# Patient Record
Sex: Female | Born: 1945 | Race: White | Hispanic: No | Marital: Married | Smoking: Former smoker
Health system: Southern US, Community
[De-identification: ages and names within clinical notes are randomized; demographics above are authoritative.]

## PROBLEM LIST (undated history)

## (undated) DIAGNOSIS — I700 Atherosclerosis of aorta: Secondary | ICD-10-CM

## (undated) DIAGNOSIS — E119 Type 2 diabetes mellitus without complications: Secondary | ICD-10-CM

## (undated) DIAGNOSIS — E1140 Type 2 diabetes mellitus with diabetic neuropathy, unspecified: Secondary | ICD-10-CM

## (undated) DIAGNOSIS — M1990 Unspecified osteoarthritis, unspecified site: Secondary | ICD-10-CM

## (undated) DIAGNOSIS — G8929 Other chronic pain: Secondary | ICD-10-CM

## (undated) DIAGNOSIS — S0291XA Unspecified fracture of skull, initial encounter for closed fracture: Secondary | ICD-10-CM

## (undated) DIAGNOSIS — E785 Hyperlipidemia, unspecified: Secondary | ICD-10-CM

## (undated) DIAGNOSIS — I824Y9 Acute embolism and thrombosis of unspecified deep veins of unspecified proximal lower extremity: Secondary | ICD-10-CM

## (undated) DIAGNOSIS — M542 Cervicalgia: Secondary | ICD-10-CM

## (undated) DIAGNOSIS — I8390 Asymptomatic varicose veins of unspecified lower extremity: Secondary | ICD-10-CM

## (undated) DIAGNOSIS — G510 Bell's palsy: Secondary | ICD-10-CM

## (undated) DIAGNOSIS — G459 Transient cerebral ischemic attack, unspecified: Secondary | ICD-10-CM

## (undated) DIAGNOSIS — I773 Arterial fibromuscular dysplasia: Secondary | ICD-10-CM

## (undated) DIAGNOSIS — I340 Nonrheumatic mitral (valve) insufficiency: Secondary | ICD-10-CM

## (undated) HISTORY — DX: Atherosclerosis of aorta: I70.0

## (undated) HISTORY — PX: OTHER SURGICAL HISTORY: SHX169

## (undated) HISTORY — DX: Nonrheumatic mitral (valve) insufficiency: I34.0

## (undated) HISTORY — DX: Transient cerebral ischemic attack, unspecified: G45.9

## (undated) HISTORY — PX: ENDOVENOUS ABLATION SAPHENOUS VEIN W/ LASER: SUR449

## (undated) HISTORY — DX: Acute embolism and thrombosis of unspecified deep veins of unspecified proximal lower extremity: I82.4Y9

## (undated) HISTORY — PX: TONSILLECTOMY: SHX5217

## (undated) HISTORY — DX: Type 2 diabetes mellitus with diabetic neuropathy, unspecified: E11.40

## (undated) HISTORY — DX: Arterial fibromuscular dysplasia: I77.3

## (undated) HISTORY — DX: Unspecified osteoarthritis, unspecified site: M19.90

## (undated) HISTORY — DX: Other chronic pain: G89.29

## (undated) HISTORY — DX: Asymptomatic varicose veins of unspecified lower extremity: I83.90

## (undated) HISTORY — PX: ELBOW SURGERY: SHX618

---
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
## 1998-06-14 ENCOUNTER — Ambulatory Visit (HOSPITAL_COMMUNITY): Admission: RE | Admit: 1998-06-14 | Discharge: 1998-06-14 | Payer: Self-pay | Admitting: *Deleted

## 1998-06-21 ENCOUNTER — Ambulatory Visit (HOSPITAL_COMMUNITY): Admission: RE | Admit: 1998-06-21 | Discharge: 1998-06-21 | Payer: Self-pay | Admitting: *Deleted

## 1998-10-15 ENCOUNTER — Ambulatory Visit (HOSPITAL_COMMUNITY): Admission: RE | Admit: 1998-10-15 | Discharge: 1998-10-15 | Payer: Self-pay | Admitting: *Deleted

## 1999-03-20 ENCOUNTER — Encounter: Payer: Self-pay | Admitting: Orthopedic Surgery

## 1999-03-20 ENCOUNTER — Ambulatory Visit (HOSPITAL_COMMUNITY): Admission: RE | Admit: 1999-03-20 | Discharge: 1999-03-20 | Payer: Self-pay | Admitting: Orthopedic Surgery

## 1999-09-15 ENCOUNTER — Other Ambulatory Visit: Admission: RE | Admit: 1999-09-15 | Discharge: 1999-09-15 | Payer: Self-pay | Admitting: *Deleted

## 1999-09-18 ENCOUNTER — Encounter: Payer: Self-pay | Admitting: *Deleted

## 1999-09-18 ENCOUNTER — Ambulatory Visit (HOSPITAL_COMMUNITY): Admission: RE | Admit: 1999-09-18 | Discharge: 1999-09-18 | Payer: Self-pay | Admitting: *Deleted

## 2000-03-19 ENCOUNTER — Ambulatory Visit (HOSPITAL_BASED_OUTPATIENT_CLINIC_OR_DEPARTMENT_OTHER): Admission: RE | Admit: 2000-03-19 | Discharge: 2000-03-19 | Payer: Self-pay | Admitting: Orthopedic Surgery

## 2000-09-17 ENCOUNTER — Other Ambulatory Visit: Admission: RE | Admit: 2000-09-17 | Discharge: 2000-09-17 | Payer: Self-pay | Admitting: *Deleted

## 2000-10-16 ENCOUNTER — Ambulatory Visit (HOSPITAL_BASED_OUTPATIENT_CLINIC_OR_DEPARTMENT_OTHER): Admission: RE | Admit: 2000-10-16 | Discharge: 2000-10-16 | Payer: Self-pay | Admitting: Orthopedic Surgery

## 2001-03-28 ENCOUNTER — Encounter (INDEPENDENT_AMBULATORY_CARE_PROVIDER_SITE_OTHER): Payer: Self-pay

## 2001-03-28 ENCOUNTER — Ambulatory Visit (HOSPITAL_COMMUNITY): Admission: RE | Admit: 2001-03-28 | Discharge: 2001-03-28 | Payer: Self-pay | Admitting: Obstetrics and Gynecology

## 2001-10-02 ENCOUNTER — Other Ambulatory Visit: Admission: RE | Admit: 2001-10-02 | Discharge: 2001-10-02 | Payer: Self-pay | Admitting: Obstetrics and Gynecology

## 2004-03-02 ENCOUNTER — Ambulatory Visit (HOSPITAL_COMMUNITY): Admission: RE | Admit: 2004-03-02 | Discharge: 2004-03-02 | Payer: Self-pay | Admitting: Internal Medicine

## 2004-07-03 ENCOUNTER — Ambulatory Visit (HOSPITAL_COMMUNITY): Admission: RE | Admit: 2004-07-03 | Discharge: 2004-07-03 | Payer: Self-pay | Admitting: *Deleted

## 2004-07-20 ENCOUNTER — Encounter: Admission: RE | Admit: 2004-07-20 | Discharge: 2004-07-20 | Payer: Self-pay | Admitting: Internal Medicine

## 2004-09-19 ENCOUNTER — Encounter: Admission: RE | Admit: 2004-09-19 | Discharge: 2004-09-19 | Payer: Self-pay | Admitting: Internal Medicine

## 2004-10-24 ENCOUNTER — Encounter: Admission: RE | Admit: 2004-10-24 | Discharge: 2004-10-24 | Payer: Self-pay | Admitting: Internal Medicine

## 2004-10-26 ENCOUNTER — Encounter: Admission: RE | Admit: 2004-10-26 | Discharge: 2004-10-26 | Payer: Self-pay | Admitting: Internal Medicine

## 2004-10-31 ENCOUNTER — Encounter: Admission: RE | Admit: 2004-10-31 | Discharge: 2004-10-31 | Payer: Self-pay | Admitting: Internal Medicine

## 2004-11-28 ENCOUNTER — Encounter: Admission: RE | Admit: 2004-11-28 | Discharge: 2004-11-28 | Payer: Self-pay | Admitting: Internal Medicine

## 2004-12-05 ENCOUNTER — Encounter: Admission: RE | Admit: 2004-12-05 | Discharge: 2004-12-05 | Payer: Self-pay | Admitting: Internal Medicine

## 2004-12-12 ENCOUNTER — Encounter: Admission: RE | Admit: 2004-12-12 | Discharge: 2004-12-12 | Payer: Self-pay | Admitting: Internal Medicine

## 2004-12-26 ENCOUNTER — Encounter: Admission: RE | Admit: 2004-12-26 | Discharge: 2004-12-26 | Payer: Self-pay | Admitting: Internal Medicine

## 2005-01-11 ENCOUNTER — Encounter: Admission: RE | Admit: 2005-01-11 | Discharge: 2005-01-11 | Payer: Self-pay | Admitting: Interventional Radiology

## 2005-02-06 ENCOUNTER — Encounter: Admission: RE | Admit: 2005-02-06 | Discharge: 2005-02-06 | Payer: Self-pay | Admitting: Internal Medicine

## 2005-04-24 ENCOUNTER — Encounter: Admission: RE | Admit: 2005-04-24 | Discharge: 2005-04-24 | Payer: Self-pay | Admitting: Internal Medicine

## 2005-06-12 ENCOUNTER — Encounter: Admission: RE | Admit: 2005-06-12 | Discharge: 2005-06-12 | Payer: Self-pay | Admitting: Internal Medicine

## 2005-12-25 ENCOUNTER — Emergency Department (HOSPITAL_COMMUNITY): Admission: EM | Admit: 2005-12-25 | Discharge: 2005-12-25 | Payer: Self-pay | Admitting: Family Medicine

## 2006-05-21 ENCOUNTER — Encounter: Admission: RE | Admit: 2006-05-21 | Discharge: 2006-05-21 | Payer: Self-pay | Admitting: Internal Medicine

## 2006-06-12 ENCOUNTER — Encounter: Admission: RE | Admit: 2006-06-12 | Discharge: 2006-06-12 | Payer: Self-pay | Admitting: Internal Medicine

## 2007-05-12 ENCOUNTER — Encounter: Admission: RE | Admit: 2007-05-12 | Discharge: 2007-08-10 | Payer: Self-pay | Admitting: Internal Medicine

## 2007-07-10 ENCOUNTER — Encounter: Admission: RE | Admit: 2007-07-10 | Discharge: 2007-07-10 | Payer: Self-pay | Admitting: Internal Medicine

## 2008-07-13 ENCOUNTER — Encounter: Admission: RE | Admit: 2008-07-13 | Discharge: 2008-07-13 | Payer: Self-pay | Admitting: Internal Medicine

## 2009-07-26 ENCOUNTER — Encounter: Admission: RE | Admit: 2009-07-26 | Discharge: 2009-07-26 | Payer: Self-pay | Admitting: Internal Medicine

## 2010-02-01 ENCOUNTER — Other Ambulatory Visit: Admission: RE | Admit: 2010-02-01 | Discharge: 2010-02-01 | Payer: Self-pay | Admitting: Internal Medicine

## 2010-09-24 ENCOUNTER — Encounter: Payer: Self-pay | Admitting: Internal Medicine

## 2011-01-19 NOTE — H&P (Signed)
[facility]  Patient:    Beth Macdonald, Beth Macdonald                      MRN: [id number] Adm. Date:  03/28/01 Attending:  Lenoard Aden, M.D.                         History and Physical  ADMISSION DIAGNOSIS:          Postmenopausal bleeding.  HISTORY OF PRESENT ILLNESS:   The patient is a 65 year old white female G0 who presents with postmenopausal bleeding.  Saline sonohysterography consistent with posterior wall and anterior wall thickening, consistent with a questionable polyp versus submucosal fibroid.  She has had a normal endometrial biopsy performed in October 1999 and she is postmenopausal, currently on no hormone replacement therapy.  MEDICATIONS:                  1. Lipitor.                               2. Vitamins.                               3. Calcium.                               4. Vioxx.  PAST SURGICAL HISTORY:        She has had a history of left elbow surgery in 2001 and right elbow surgery as well.  PAST MEDICAL HISTORY:         She has history of HPV with laser vaporization and Efudex.  She has a history of hypercholesterolemia.  FAMILY HISTORY:               She has a family history of leukemia.  SOCIAL HISTORY:               Otherwise noncontributory.  She is a nonsmoker, nondrinker.  Denies domestic or physical violence.  PHYSICAL EXAMINATION:  GENERAL:                      She is a well-developed, well-nourished white female in no apparent distress.  HEENT:                        Normal.  LUNGS:                        Clear.  HEART:                        Regular rate and rhythm.  ABDOMEN:                      Soft, nontender.  PELVIC:                       An anteflexed uterus and no adnexal masses.  IMPRESSION:                   1. Postmenopausal bleeding on no hormone  replacement therapy.                               2. History of atypical squamous cells of                  undetermined significance.                               3. History of postmenopausal bleeding with                                  normal endometrial biopsy in 1999.  PLAN:                         Proceed with diagnostic hysteroscopy, possible resectoscope.  Risks of anesthesia, infection, bleeding, injury of abdominal organs, and need for repair is discussed.  The patient acknowledges and desires to proceed.  Delayed versus immediate complications to include bowel and bladder injury are noted.  The patient acknowledges. DD:  03/28/01 TD:  03/28/01 Job: [id number]

## 2011-01-19 NOTE — Op Note (Signed)
NAMEJANASHA, Beth Macdonald NO.:  [id number]   MEDICAL RECORD NO.:  [id number]          PATIENT TYPE:  AMB   LOCATION:  ENDO                         FACILITY:  [facility]   PHYSICIAN:  Georgiana Spinner, M.D.    DATE OF BIRTH:  [birth year]   DATE OF PROCEDURE:  07/03/2004  DATE OF DISCHARGE:                                 OPERATIVE REPORT   PROCEDURE:  Colonoscopy.   INDICATIONS:  Rectal bleeding, colon cancer screening.   ANESTHESIA:  Demerol 60, Versed 7 mg.   DESCRIPTION OF PROCEDURE:  With patient mildly sedated in the left lateral  decubitus position, the Olympus videoscopic colonoscope was inserted in the  rectum and passed under direct vision to the cecum, identified by ileocecal  valve and base of cecum, both of which were photographed.  From this point,  the colonoscope was slowly withdrawn, taking circumferential views of the  colonic mucosa, stopping in the rectum which appeared normal on direct and  retroflexed view.  The endoscope was straightened and withdrawn.  The  patient's vital signs and pulse oximeter remained stable.  The patient  tolerated the procedure well without apparent complications.   FINDINGS:  Unremarkable examination.   PLAN:  Consider repeat examination in 5-10 years.      GMO/MEDQ  D:  07/03/2004  T:  07/03/2004  Job:  [id number]

## 2011-01-19 NOTE — Op Note (Signed)
[place]  Patient:    Beth Macdonald, Beth Macdonald                    MRN: [id number] Proc. Date: 10/16/00 Adm. Date:  [id number] Attending:  Ronne Binning                           Operative Report  PREOPERATIVE DIAGNOSIS:  Lateral epicondylitis, right elbow.  POSTOPERATIVE DIAGNOSIS:  Lateral epicondylitis, right elbow.  OPERATION PERFORMED:  Reattachment of extensor origin, right elbow.  SURGEON:  Nicki Reaper, M.D.  ASSISTANTCarolyne Fiscal, RN.  ANESTHESIA:  Superclavicular.  ANESTHESIOLOGIST:  Dr. Gelene Mink.  INDICATIONS FOR PROCEDURE:  The patient is a 65 year old female with a history of lateral epicondylitis.  She has undergone repair on her left side.  She has failed conservative treatment on her right side.  She is admitted now for a reconstruction of her lateral condyle extensor origin, right elbow.  DESCRIPTION OF PROCEDURE:  The patient was brought to the operating room where a superclavicular block was carried out without difficulty.  She was prepped and draped using Betadine scrub and solution.  With the right arm free, the limb was exsanguinated with an Esmarch bandage.  The tourniquet was placed high on the arm and inflated to 250 mmHg.  A straight incision was made over the lateral epicondyle and carried down through subcutaneous tissues. Bleeders were electrocauterized.  The extensor anconeus interval was opened. The dissection carried down to the joint.  The frond of tissue in the synovium posterior to the radiohumeral joint was excise.  This area was irrigated and closed with Vicryl sutures.  A longitudinal incision was then made at the extensor origin.  A spur was encountered.  This was removed.  The origin of the extensor brevis was found to be replaced with granulation tissue.  A very friable nonadherent portion from the lateral epicondyle of this area was debrided.  The bone was roughened.  Two 3.5 Statek anchors were then  inserted. The area irrigated.  The extensor origin was then repaired down to the epicondyle and secured into position burying the knots underneath the extensor origin.  The remainder of the incision into the extensor origin was closed with figure-of-eight 4-0 Vicryl sutures.  The subcutaneous tissues were closed with interrupted 4-0 Vicryl and the skin with subcuticular 4-0 Monocryl sutuers.  Steri-Strips were applied and a sterile compressive dressing and a long arm splint were applied.  The patient tolerated the procedure well and was taken to the recovery room for observation in satisfactory condition. She is discharged home to return to the [facility] [place] in one week on Percodan, Keflex and Ambien. DD:  10/16/00 TD:  10/16/00 Job: [id number]

## 2011-01-19 NOTE — Op Note (Signed)
[facility]  Patient:    Beth Macdonald, Beth Macdonald                    MRN: [id number] Proc. Date: 03/28/01 Adm. Date:  [id number] Attending:  Lenoard Aden CC:         [facility]   Operative Report  PREOPERATIVE DIAGNOSIS:       Postmenopausal bleeding.  POSTOPERATIVE DIAGNOSIS:      Endometrial polyps x 2.  PROCEDURES:                   1. Diagnostic hysteroscopy.                               2. Polypectomy.                               3. Dilation and curettage.  SURGEON:                      Lenoard Aden, M.D.  ANESTHESIA:                   General.  ESTIMATED BLOOD LOSS:         50 cc.  COMPLICATIONS:                None.  FLUID DEFICIT:                50 cc.  COUNTS:                       Correct.  DISPOSITION:                  Patient to recovery in good condition.  BRIEF OPERATIVE NOTE:         After being apprised of the risks of anesthesia, infection, bleeding, and uterine perforation with the need for repair, the patient was brought to the operating room, where she was administered general anesthetic without complication, prepped and draped in the usual sterile fashion and catheterized until the bladder was empty.  Examination under anesthesia revealed a small, anteflexed uterus and no adnexal masses.  A dilute Pitressin solution was placed at 3 and 9 oclock, 12 cc total.  The cervix was dilated with difficulty up to a #27 Pratt dilator.  A diagnostic hysteroscope was placed, revealing one small left lateral wall to anterior fundal uterine endometrial polyp which appeared fleshy and vascular, and a smaller, more atrophic-appearing posterior wall near the cervicouterine junction which was along the posterior wall, which appeared more atrophic. These were resected using resectoscope was placed difficulty.  D&C was performed.  Revisualization revealed complete removal of the polyps. Specimens were sent to pathology for  confirmation.  Bilateral normal tubal ostia were noted.  Fluid deficit of 50 cc was noted.  All instruments were removed.  Good hemostasis was noted.  The patient tolerated the procedure well and was transferred to recovery in good condition. DD:  03/28/01 TD:  03/29/01 Job: [id number]

## 2011-01-19 NOTE — Op Note (Signed)
[place]. [facility]  Patient:    Beth Macdonald, Beth Macdonald                    MRN: [id number] Proc. Date: 03/19/00 Adm. Date:  [id number] Attending:  Ronne Binning CC:         Nicki Reaper, M.D. (2 copies)                           Operative Report  PREOPERATIVE DIAGNOSIS:  Lateral epicondylitis, left elbow.  POSTOPERATIVE DIAGNOSIS:  Lateral epicondylitis, left elbow.  OPERATION PERFORMED:  Reattachment extensor origin, left elbow.  SURGEON:  Nicki Reaper, M.D.  ASSISTANT:  R.N.  ANESTHESIA:  Axillary block  ANESTHESIA:  Burna Forts, M.D.  HISTORY OF PRESENT ILLNESS:  The patient is a 65 year old female with a history of lateral epicondylitis which has not responded to conservative treatment.  DESCRIPTION OF PROCEDURE:  The patient was brought to the operating room where an axillary block was carried out without difficulty. She was prepped and draped using Betadine scrubbing solution. The left arm freed, the wound was exsanguinated with an Esmarch bandage, tourniquet placed high and the arm was inflated to 250 mmHg. A straight incision was made over the epicondyle, carried down through subcutaneous tissue, bleeders were electrocauterized. The anterior bulb between the anconeus and the extensor muscle mass was opened down to the radial humeral joint. Debridement of the articular ______ synovial tissue was then performed. This was irrigated and closed with interrupted 4-0 Vicryl. A longitudinal incision was then made into the extensor origin in line with the fibers. These were then split. The extensor brevis was immediately apparent with an area of degenerative tissue. This area was debrided with a complete rupture of the extensor brevis off from the lateral epicondyle. The epicondyle was roughened. A 3/5 Statak anchor was then placed, this did not hold out. A second anchor was then reinserted slightly more distally and a third slightly more  proximally. These showed good fixation and did not pull out. The extensor origin was then reapproximated under the roughened area of the epicondyle and sutured into position with the 2-0 Mersilene sutures burying the knot in the hole from the original anchor. The fascia was then closed with figure-of-eight 4-0 Vicryl. This was all done after irrigation of the extensor origin area. The subcutaneous tissue was closed with interrupted 4-0 Vicryl and the skin with subcuticular 3-0 monocryl sutures. Steri-Strips were applied. A sterile compressive dressing and long arm splint applied. The patient tolerated the procedure well and was taken to the recovery room for observation in satisfactory condition. She is discharged home to return to the [facility] [place] in 1 week on Vicodin and Keflex. DD:  03/03/00 TD:  03/20/00 Job: [id number]

## 2011-04-05 ENCOUNTER — Other Ambulatory Visit: Payer: Self-pay | Admitting: Orthopedic Surgery

## 2011-04-05 DIAGNOSIS — M25512 Pain in left shoulder: Secondary | ICD-10-CM

## 2011-04-09 ENCOUNTER — Ambulatory Visit
Admission: RE | Admit: 2011-04-09 | Discharge: 2011-04-09 | Disposition: A | Discharge status: Home / Self Care | Payer: Medicare Other | Source: Ambulatory Visit | Attending: Orthopedic Surgery | Admitting: Orthopedic Surgery

## 2011-04-09 ENCOUNTER — Other Ambulatory Visit: Payer: Self-pay

## 2011-04-09 DIAGNOSIS — M25512 Pain in left shoulder: Secondary | ICD-10-CM

## 2011-10-18 DIAGNOSIS — H40019 Open angle with borderline findings, low risk, unspecified eye: Secondary | ICD-10-CM | POA: Diagnosis not present

## 2011-10-18 DIAGNOSIS — H16149 Punctate keratitis, unspecified eye: Secondary | ICD-10-CM | POA: Diagnosis not present

## 2011-12-13 DIAGNOSIS — Z79899 Other long term (current) drug therapy: Secondary | ICD-10-CM | POA: Diagnosis not present

## 2011-12-13 DIAGNOSIS — E7800 Pure hypercholesterolemia, unspecified: Secondary | ICD-10-CM | POA: Diagnosis not present

## 2011-12-20 DIAGNOSIS — R7309 Other abnormal glucose: Secondary | ICD-10-CM | POA: Diagnosis not present

## 2011-12-20 DIAGNOSIS — E7800 Pure hypercholesterolemia, unspecified: Secondary | ICD-10-CM | POA: Diagnosis not present

## 2011-12-20 DIAGNOSIS — M159 Polyosteoarthritis, unspecified: Secondary | ICD-10-CM | POA: Diagnosis not present

## 2011-12-20 DIAGNOSIS — K5900 Constipation, unspecified: Secondary | ICD-10-CM | POA: Diagnosis not present

## 2012-01-02 DIAGNOSIS — Z1212 Encounter for screening for malignant neoplasm of rectum: Secondary | ICD-10-CM | POA: Diagnosis not present

## 2012-02-20 DIAGNOSIS — L538 Other specified erythematous conditions: Secondary | ICD-10-CM | POA: Diagnosis not present

## 2012-02-20 DIAGNOSIS — D235 Other benign neoplasm of skin of trunk: Secondary | ICD-10-CM | POA: Diagnosis not present

## 2012-02-25 DIAGNOSIS — H40059 Ocular hypertension, unspecified eye: Secondary | ICD-10-CM | POA: Diagnosis not present

## 2012-02-25 DIAGNOSIS — H04129 Dry eye syndrome of unspecified lacrimal gland: Secondary | ICD-10-CM | POA: Diagnosis not present

## 2012-02-25 DIAGNOSIS — H40019 Open angle with borderline findings, low risk, unspecified eye: Secondary | ICD-10-CM | POA: Diagnosis not present

## 2012-02-25 DIAGNOSIS — L719 Rosacea, unspecified: Secondary | ICD-10-CM | POA: Diagnosis not present

## 2012-02-25 DIAGNOSIS — H43399 Other vitreous opacities, unspecified eye: Secondary | ICD-10-CM | POA: Diagnosis not present

## 2012-04-15 DIAGNOSIS — R7301 Impaired fasting glucose: Secondary | ICD-10-CM | POA: Diagnosis not present

## 2012-04-15 DIAGNOSIS — Z0000 Encounter for general adult medical examination without abnormal findings: Secondary | ICD-10-CM | POA: Diagnosis not present

## 2012-04-16 DIAGNOSIS — R7309 Other abnormal glucose: Secondary | ICD-10-CM | POA: Diagnosis not present

## 2012-04-16 DIAGNOSIS — Z79899 Other long term (current) drug therapy: Secondary | ICD-10-CM | POA: Diagnosis not present

## 2012-04-16 DIAGNOSIS — E7800 Pure hypercholesterolemia, unspecified: Secondary | ICD-10-CM | POA: Diagnosis not present

## 2012-04-21 DIAGNOSIS — E7800 Pure hypercholesterolemia, unspecified: Secondary | ICD-10-CM | POA: Diagnosis not present

## 2012-04-21 DIAGNOSIS — M159 Polyosteoarthritis, unspecified: Secondary | ICD-10-CM | POA: Diagnosis not present

## 2012-04-21 DIAGNOSIS — Z791 Long term (current) use of non-steroidal anti-inflammatories (NSAID): Secondary | ICD-10-CM | POA: Diagnosis not present

## 2012-04-21 DIAGNOSIS — M542 Cervicalgia: Secondary | ICD-10-CM | POA: Diagnosis not present

## 2012-04-21 DIAGNOSIS — Z1212 Encounter for screening for malignant neoplasm of rectum: Secondary | ICD-10-CM | POA: Diagnosis not present

## 2012-05-20 DIAGNOSIS — M542 Cervicalgia: Secondary | ICD-10-CM | POA: Diagnosis not present

## 2012-05-23 DIAGNOSIS — M542 Cervicalgia: Secondary | ICD-10-CM | POA: Diagnosis not present

## 2012-05-26 DIAGNOSIS — M542 Cervicalgia: Secondary | ICD-10-CM | POA: Diagnosis not present

## 2012-05-28 DIAGNOSIS — M542 Cervicalgia: Secondary | ICD-10-CM | POA: Diagnosis not present

## 2012-06-03 DIAGNOSIS — M542 Cervicalgia: Secondary | ICD-10-CM | POA: Diagnosis not present

## 2012-06-06 DIAGNOSIS — M542 Cervicalgia: Secondary | ICD-10-CM | POA: Diagnosis not present

## 2012-06-10 DIAGNOSIS — M542 Cervicalgia: Secondary | ICD-10-CM | POA: Diagnosis not present

## 2012-06-12 DIAGNOSIS — M542 Cervicalgia: Secondary | ICD-10-CM | POA: Diagnosis not present

## 2012-06-17 DIAGNOSIS — M542 Cervicalgia: Secondary | ICD-10-CM | POA: Diagnosis not present

## 2012-06-19 DIAGNOSIS — M542 Cervicalgia: Secondary | ICD-10-CM | POA: Diagnosis not present

## 2012-06-23 DIAGNOSIS — M542 Cervicalgia: Secondary | ICD-10-CM | POA: Diagnosis not present

## 2012-06-25 DIAGNOSIS — M542 Cervicalgia: Secondary | ICD-10-CM | POA: Diagnosis not present

## 2012-07-02 DIAGNOSIS — M542 Cervicalgia: Secondary | ICD-10-CM | POA: Diagnosis not present

## 2012-07-09 DIAGNOSIS — M542 Cervicalgia: Secondary | ICD-10-CM | POA: Diagnosis not present

## 2012-09-25 DIAGNOSIS — M79609 Pain in unspecified limb: Secondary | ICD-10-CM | POA: Diagnosis not present

## 2012-09-25 DIAGNOSIS — M3500 Sicca syndrome, unspecified: Secondary | ICD-10-CM | POA: Diagnosis not present

## 2012-10-16 ENCOUNTER — Ambulatory Visit: Payer: Medicare Other | Admitting: Sports Medicine

## 2012-10-20 ENCOUNTER — Encounter: Payer: Self-pay | Admitting: Sports Medicine

## 2012-10-20 ENCOUNTER — Ambulatory Visit (INDEPENDENT_AMBULATORY_CARE_PROVIDER_SITE_OTHER): Payer: Medicare Other | Admitting: Sports Medicine

## 2012-10-20 VITALS — BP 154/81 | HR 61 | Ht 69.0 in | Wt 175.0 lb

## 2012-10-20 DIAGNOSIS — M25569 Pain in unspecified knee: Secondary | ICD-10-CM | POA: Diagnosis not present

## 2012-10-20 DIAGNOSIS — M25579 Pain in unspecified ankle and joints of unspecified foot: Secondary | ICD-10-CM | POA: Diagnosis not present

## 2012-10-20 DIAGNOSIS — R29898 Other symptoms and signs involving the musculoskeletal system: Secondary | ICD-10-CM | POA: Insufficient documentation

## 2012-10-21 NOTE — Progress Notes (Signed)
  Subjective:    Patient ID: Beth Macdonald, female    DOB: June 16, 1946, 67 y.o.   MRN: [id number]  HPI chief complaint: Left lower leg pain  67 year old female comes in today complaining of 6 weeks of left lower leg pain. No specific injury she can recall. She is very at the of in water aerobics as well as with a recreational walking. 6 weeks ago she began to experience a diffuse ache along the lateral aspect of her left lower leg. She noticed the pain to be worse with any type of resistance while working out in the water or with walking. For the past 2 weeks she has been resting and notes that her pain has improved but not yet resolved. She has not noticed any swelling. She denies pain more proximally in the knee or more distally in the ankle although she does state that she has a history of arthritis and that her knees and hips are becoming more symptomatic due to her absence from water aerobics. Her pain will occasionally awaken her at night. She describes a rather painful cramping sensation from time to time along the lateral leg. She denies any low back pain. No associated numbness or tingling. No weakness. No prior occurrences. She's been taking intermittent doses of Advil Medications include Lipitor No known drug allergies Socially she is a former smoker having quit 30 years ago. Drinks one to 2 times a week. 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 No acute distress. Awake alert and oriented x3  Left lower leg: Knee shows full range of motion with no effusion. There is no tenderness along the distal IT band. There is discrete tenderness to palpation along the area of the peroneus muscle bellies just distal to the fibular head. There is no palpable defect. No soft tissue swelling. Negative Tinel's over the fibular head. She has full strength in her foot and ankle distally but does have reproducible pain with  stretching of the peroneal muscles and tendons. Good dorsalis pedis and posterior tibial pulses. No muscle atrophy. She walks without a significant limp but does demonstrate a slight amount of pronation particularly on the left  MSK ultrasound of the left lower leg: Images show a discrete area of hypoechogenicity in the proximal muscle belly of the peroneal longus. There is increased neovascularity here as well.       Assessment & Plan:  1. Left lower leg pain likely secondary to peroneus muscle strain  I think she should wait 2 or 3 more weeks before returning to water aerobics or her recreational walking program. I've given her a body helix calf sleeve to wear with activity. We will also try a pair of green sports insoles with scaphoid pads to help correct her mild pronation. She will followup with me in 3 weeks. I will plan on repeating her ultrasound at that time. She will call with questions or concerns in the interim.

## 2012-11-04 ENCOUNTER — Ambulatory Visit: Payer: Medicare Other | Admitting: Sports Medicine

## 2012-11-10 ENCOUNTER — Ambulatory Visit
Admission: RE | Admit: 2012-11-10 | Discharge: 2012-11-10 | Disposition: A | Discharge status: Home / Self Care | Payer: Medicare Other | Source: Ambulatory Visit | Attending: Sports Medicine | Admitting: Sports Medicine

## 2012-11-10 ENCOUNTER — Ambulatory Visit (INDEPENDENT_AMBULATORY_CARE_PROVIDER_SITE_OTHER): Payer: Medicare Other | Admitting: Sports Medicine

## 2012-11-10 DIAGNOSIS — M79609 Pain in unspecified limb: Secondary | ICD-10-CM | POA: Diagnosis not present

## 2012-11-11 ENCOUNTER — Telehealth: Payer: Self-pay | Admitting: Sports Medicine

## 2012-11-11 NOTE — Progress Notes (Signed)
  Subjective:    Patient ID: Beth Macdonald, female    DOB: 28-Aug-1946, 67 y.o.   MRN: [id number]  HPI  Patient comes in today for followup on left lower leg pain. Pain is about the same as it was 3 weeks ago. She's been using her compression sleeve. Has also been using her green sports insoles and scaphoid pads. Despite that she still experiencing diffuse pain along the lateral aspect of her lower leg. She denies numbness or tingling. She has yet to return to water aerobics or her recreational walking program.    Review of Systems     Objective:   Physical Exam  Well-developed, well-nourished. No acute distress. Awake alert and oriented x3  Left lower leg: There is still tenderness to palpation along the proximal to midportion of the lateral lower leg. This is along the course of the peroneal muscle bellies and the proximal fibula. There is no significant soft tissue swelling. Negative Tinel's behind the fibular head. Reproducible pain with resisted foot eversion and with passive stretching of the peroneal tendons at the ankle. No tenderness to palpation over the tibia. Neurovascularly intact distally. No atrophy. Walking without significant limp.  MSK ultrasound of the left lower leg was obtained. Images in both long and short view continue to show some mild hypoechogenicity along the proximal peroneal longus muscle belly. I do not see any discrete tear. There is still increased neovascularity.        Assessment & Plan:  1. Persistent left lower leg pain secondary to peroneal muscle strain versus possible fibular stress fracture  X-rays of the left tib-fib specifically to rule out a fibular stress fracture. Patient will continue with her current treatment including her pressure sleeve and inserts. Followup in 3 weeks. I will call her after reviewing her x-rays. If x-rays are negative for fibular stress fracture and symptoms persist a followup we may need to consider merits of further  diagnostic imaging in the form of an MRI. If symptoms worsen in the interim we may need to consider that sooner. Still no water aerobics or recreational walking until symptoms have improved.

## 2012-11-11 NOTE — Telephone Encounter (Signed)
Spoke with patient about the xray of her left lower leg. It is negative for a fibular stress fracture. Follow uo as scheduled. If symptoms persist or worsen, then consider further diagnostic imaging.

## 2012-11-24 ENCOUNTER — Other Ambulatory Visit: Payer: Self-pay | Admitting: *Deleted

## 2012-11-24 DIAGNOSIS — M25572 Pain in left ankle and joints of left foot: Secondary | ICD-10-CM

## 2012-11-24 DIAGNOSIS — M25562 Pain in left knee: Secondary | ICD-10-CM

## 2012-11-26 ENCOUNTER — Ambulatory Visit
Admission: RE | Admit: 2012-11-26 | Discharge: 2012-11-26 | Disposition: A | Discharge status: Home / Self Care | Payer: Medicare Other | Source: Ambulatory Visit | Attending: Sports Medicine | Admitting: Sports Medicine

## 2012-11-26 DIAGNOSIS — M25562 Pain in left knee: Secondary | ICD-10-CM

## 2012-11-26 DIAGNOSIS — R609 Edema, unspecified: Secondary | ICD-10-CM | POA: Diagnosis not present

## 2012-11-26 DIAGNOSIS — M25572 Pain in left ankle and joints of left foot: Secondary | ICD-10-CM

## 2012-12-01 ENCOUNTER — Ambulatory Visit (INDEPENDENT_AMBULATORY_CARE_PROVIDER_SITE_OTHER): Payer: Medicare Other | Admitting: Sports Medicine

## 2012-12-01 VITALS — BP 136/70 | Ht 68.0 in | Wt 175.0 lb

## 2012-12-01 DIAGNOSIS — M25562 Pain in left knee: Secondary | ICD-10-CM

## 2012-12-01 DIAGNOSIS — M25569 Pain in unspecified knee: Secondary | ICD-10-CM | POA: Diagnosis not present

## 2012-12-01 NOTE — Progress Notes (Signed)
  Subjective:    Patient ID: Beth Macdonald, female    DOB: 03-Nov-1945, 67 y.o.   MRN: [id number]  HPI Patient comes in today for followup. Other than some mild subcutaneous edema the MRI of her left lower leg was unremarkable. She still complaining of activity related pain along the lateral lower leg. She feels like the pain may be beginning in the lateral aspect of her knee. Pain improves at rest.    Review of Systems     Objective:   Physical Exam Well-developed, well-nourished. No acute distress  Left knee: Full range of motion. No effusion. There is tenderness to palpation along the lateral joint line but a negative McMurray's. Negative test please. Good joint stability. Still some slight tenderness to palpation just distal to the lateral joint line along the proximal fibula and muscle bellies of the peroneals. Neurovascular intact distally.       Assessment & Plan:  1. Persistent left lower leg pain-question referred pain from left lateral knee  For diagnostic as well as therapeutic reasons I recommended an intra-articular cortisone injection. Patient would like to schedule this for tomorrow instead of proceeding with it today. Therefore, I will see her back again tomorrow for her injection.

## 2012-12-02 ENCOUNTER — Ambulatory Visit: Payer: Medicare Other | Admitting: Sports Medicine

## 2012-12-03 ENCOUNTER — Ambulatory Visit (INDEPENDENT_AMBULATORY_CARE_PROVIDER_SITE_OTHER): Payer: Medicare Other | Admitting: Sports Medicine

## 2012-12-03 VITALS — BP 116/60 | Ht 69.0 in | Wt 175.0 lb

## 2012-12-03 DIAGNOSIS — M25569 Pain in unspecified knee: Secondary | ICD-10-CM | POA: Diagnosis not present

## 2012-12-03 DIAGNOSIS — M25562 Pain in left knee: Secondary | ICD-10-CM

## 2012-12-03 NOTE — Progress Notes (Signed)
Patient ID: Beth Macdonald, female   DOB: 01/16/1946, 67 y.o.   MRN: [id number]  Patient comes in today for a diagnostic/therapeutic cortisone injection into her left knee. Please see the office visit note from 2 days ago for further detail regarding history and exam.  Consent obtained and verified. Time-out conducted. Noted no overlying erythema, induration, or other signs of local infection. Skin prepped in a sterile fashion. Topical analgesic spray: Ethyl chloride. Joint: left knee/ anterior lateral approach Needle: 25g 1 1/2 inch Completed without difficulty. Meds: 3cc 1% lidocaine, 1cc (40mg ) depomedrol  Advised to call if fevers/chills, erythema, induration, drainage, or persistent bleeding.  Patient will follow up in three weeks. OK to d/c body helix calf compression sleeve.

## 2012-12-25 ENCOUNTER — Other Ambulatory Visit: Payer: Medicare Other

## 2012-12-25 ENCOUNTER — Ambulatory Visit (INDEPENDENT_AMBULATORY_CARE_PROVIDER_SITE_OTHER): Payer: Medicare Other | Admitting: Sports Medicine

## 2012-12-25 VITALS — BP 128/60 | Ht 68.0 in | Wt 175.0 lb

## 2012-12-25 DIAGNOSIS — M25569 Pain in unspecified knee: Secondary | ICD-10-CM | POA: Diagnosis not present

## 2012-12-25 DIAGNOSIS — G4762 Sleep related leg cramps: Secondary | ICD-10-CM

## 2012-12-25 DIAGNOSIS — M25562 Pain in left knee: Secondary | ICD-10-CM

## 2012-12-25 NOTE — Addendum Note (Signed)
Addended by: Annita Brod on: 12/25/2012 03:58 PM   Modules accepted: Orders

## 2012-12-25 NOTE — Progress Notes (Signed)
  Subjective:    Patient ID: Beth Macdonald, female    DOB: [birth year], 67 y.o.   MRN: [id number]  HPI Patient comes in today for followup on left lower leg pain. At her last visit we performed a diagnostic/therapeutic intra-articular left knee injection after an MRI scan of her left lower leg was basically unremarkable. That injection has provided her with significant symptom relief. Her pain has improved greatly. Her main complaint today is of bilateral calf cramping which will awaken her at night. She has recently started increasing her calcium and magnesium in an effort to try to relieve her pain. She denies cramping with exercise or with walking. Denies low back pain. No associated numbness or tingling. No history of spinal stenosis or peripheral vascular disease. Her symptoms are relieved with walking. Patient has returned to water exercises without any problem. He is asking about resuming her walking program.    Review of Systems     Objective:   Physical Exam Well-developed, well-nourished. No acute distress. Vital signs are reviewed  Left knee: Full range of motion. Minimal crepitus. No tenderness along the lateral joint line. Negative McMurray's. Left lower leg: No tenderness to palpation along the lateral lower leg. No soft tissue swelling. She is walking without a limp.       Assessment & Plan:  1. 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 Nocturnal leg cramps  Although her presentation initially was certainly not classic for left knee DJD, the fact that she got significant symptom relief with an intra-articular injection makes me suspicious that this may in fact be her pain generator. She will continue with her water exercises and she can increase her recreational walking program slowly as long as pain does not return. In regards to her nocturnal leg cramps, provided her with some information from  up-to-date. I will check a CBC, CMP, magnesium level, and TSH. I will call her with those results once available. She will followup with me in the office when necessary.

## 2012-12-26 LAB — CBC
HCT: 41.5 % (ref 36.0–46.0)
Hemoglobin: 13.8 g/dL (ref 12.0–15.0)
MCH: 29.6 pg (ref 26.0–34.0)
MCHC: 33.3 g/dL (ref 30.0–36.0)
MCV: 89.1 fL (ref 78.0–100.0)
Platelets: 227 10*3/uL (ref 150–400)
RBC: 4.66 MIL/uL (ref 3.87–5.11)
RDW: 14 % (ref 11.5–15.5)
WBC: 7.1 10*3/uL (ref 4.0–10.5)

## 2012-12-26 LAB — COMPREHENSIVE METABOLIC PANEL
ALT: 15 U/L (ref 0–35)
AST: 16 U/L (ref 0–37)
Albumin: 4.4 g/dL (ref 3.5–5.2)
Alkaline Phosphatase: 57 U/L (ref 39–117)
BUN: 33 mg/dL — ABNORMAL HIGH (ref 6–23)
CO2: 31 mEq/L (ref 19–32)
Calcium: 9.7 mg/dL (ref 8.4–10.5)
Chloride: 103 mEq/L (ref 96–112)
Creat: 1.04 mg/dL (ref 0.50–1.10)
Glucose, Bld: 116 mg/dL — ABNORMAL HIGH (ref 70–99)
Potassium: 4.9 mEq/L (ref 3.5–5.3)
Sodium: 142 mEq/L (ref 135–145)
Total Bilirubin: 0.3 mg/dL (ref 0.3–1.2)
Total Protein: 7.6 g/dL (ref 6.0–8.3)

## 2012-12-26 LAB — MAGNESIUM: Magnesium: 1.9 mg/dL (ref 1.5–2.5)

## 2012-12-26 LAB — TSH: TSH: 1.348 u[IU]/mL (ref 0.350–4.500)

## 2012-12-30 ENCOUNTER — Telehealth: Payer: Self-pay | Admitting: Sports Medicine

## 2012-12-30 NOTE — Telephone Encounter (Signed)
I spoke with the patient on the phone regarding recent blood work. Her BUN is elevated at 33 which suggests some mild dehydration. Calcium, magnesium, and potassium are all within normal limits as was her TSH. She has a nonfasting blood glucose of 116 which is within normal limits. She has started taking supplemental magnesium and feels like this has helped her nocturnal cramping. I recommended that she also start consuming a little more water. If she is going to take magnesium on a daily basis that I have recommended that she followup either with me or her primary care physician in 4 weeks to have a magnesium level drawn just to make sure she is not getting too much. Otherwise, her lower leg is feeling good after her cortisone injection into her knee. She will followup when necessary.

## 2013-02-10 ENCOUNTER — Other Ambulatory Visit (HOSPITAL_COMMUNITY): Payer: Self-pay | Admitting: Internal Medicine

## 2013-02-10 DIAGNOSIS — Z1231 Encounter for screening mammogram for malignant neoplasm of breast: Secondary | ICD-10-CM

## 2013-02-24 ENCOUNTER — Ambulatory Visit (HOSPITAL_COMMUNITY)
Admission: RE | Admit: 2013-02-24 | Discharge: 2013-02-24 | Disposition: A | Discharge status: Home / Self Care | Payer: Medicare Other | Source: Ambulatory Visit | Attending: Internal Medicine | Admitting: Internal Medicine

## 2013-02-24 DIAGNOSIS — Z1231 Encounter for screening mammogram for malignant neoplasm of breast: Secondary | ICD-10-CM | POA: Diagnosis not present

## 2013-05-13 DIAGNOSIS — H1044 Vernal conjunctivitis: Secondary | ICD-10-CM | POA: Diagnosis not present

## 2013-05-20 DIAGNOSIS — D235 Other benign neoplasm of skin of trunk: Secondary | ICD-10-CM | POA: Diagnosis not present

## 2013-05-20 DIAGNOSIS — L821 Other seborrheic keratosis: Secondary | ICD-10-CM | POA: Diagnosis not present

## 2013-05-20 DIAGNOSIS — T148 Other injury of unspecified body region: Secondary | ICD-10-CM | POA: Diagnosis not present

## 2013-07-20 ENCOUNTER — Encounter: Payer: Self-pay | Admitting: Sports Medicine

## 2013-07-20 ENCOUNTER — Ambulatory Visit
Admission: RE | Admit: 2013-07-20 | Discharge: 2013-07-20 | Disposition: A | Discharge status: Home / Self Care | Payer: Medicare Other | Source: Ambulatory Visit | Attending: Sports Medicine | Admitting: Sports Medicine

## 2013-07-20 ENCOUNTER — Ambulatory Visit (INDEPENDENT_AMBULATORY_CARE_PROVIDER_SITE_OTHER): Payer: Medicare Other | Admitting: Sports Medicine

## 2013-07-20 ENCOUNTER — Other Ambulatory Visit: Payer: Self-pay | Admitting: Sports Medicine

## 2013-07-20 VITALS — Ht 68.0 in | Wt 175.0 lb

## 2013-07-20 DIAGNOSIS — M25562 Pain in left knee: Secondary | ICD-10-CM

## 2013-07-20 DIAGNOSIS — M25569 Pain in unspecified knee: Secondary | ICD-10-CM

## 2013-07-21 ENCOUNTER — Telehealth: Payer: Self-pay | Admitting: Sports Medicine

## 2013-07-21 NOTE — Progress Notes (Signed)
  Subjective:    Patient ID: Beth Macdonald, female    DOB: 01/14/1946, 67 y.o.   MRN: [id number]  HPI Patient comes in today with returning left knee pain. An intra-articular cortisone injection administered several months ago provided her with complete symptom relief up until recently. Pain is now returned. She describes an aching discomfort along the lateral knee. Present mainly with recreational walking. She is able to perform her water exercises without any problem. No swelling in the knee. No mechanical symptoms. Her symptoms are currently tolerable but she is here today for advice about activity level and symptom management.  No change in her medical history since her last office visit    Review of Systems     Objective:   Physical Exam Well-developed, no acute distress  Left knee: Full range of motion. No effusion. No soft tissue swelling. There is tenderness to palpation along the lateral joint line with pain but no popping with McMurray's. No tenderness along the medial joint line. No popliteal cyst. Knee is stable to ligamentous exam. Neurovascularly intact distally. Walking without significant limp.       Assessment & Plan:  Returning left knee pain likely secondary to degenerative lateral meniscal tear versus DJD  I'm going to get some standing plain films of her left knee to evaluate the degree of osteoarthritis. I think she is okay to continue with activity as tolerated but she may need to find a substitute for her walking program since this is the one activity that seems to really aggravate her knee. She is asking about the possibility of a body helix compression sleeve and I certainly think it is worth a try. Her symptoms are not severe enough currently that she would like to consider a repeat intra-articular injection but she understands that that is an option down the road if needed. I will call her after I review her x-rays. She will followup with me when necessary.

## 2013-07-21 NOTE — Telephone Encounter (Signed)
I spoke with the patient on the phone regarding x-rays of her knee. She has some mild tricompartmental degenerative changes but nothing severe. 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 Please see yesterday's office note for further information regarding history and physical. Patient will followup with me when necessary.

## 2013-08-19 DIAGNOSIS — M545 Low back pain, unspecified: Secondary | ICD-10-CM | POA: Diagnosis not present

## 2013-08-19 DIAGNOSIS — M542 Cervicalgia: Secondary | ICD-10-CM | POA: Diagnosis not present

## 2013-08-31 DIAGNOSIS — M545 Low back pain, unspecified: Secondary | ICD-10-CM | POA: Diagnosis not present

## 2013-08-31 DIAGNOSIS — M542 Cervicalgia: Secondary | ICD-10-CM | POA: Diagnosis not present

## 2013-09-04 DIAGNOSIS — M545 Low back pain, unspecified: Secondary | ICD-10-CM | POA: Diagnosis not present

## 2013-09-04 DIAGNOSIS — M542 Cervicalgia: Secondary | ICD-10-CM | POA: Diagnosis not present

## 2013-09-14 DIAGNOSIS — M542 Cervicalgia: Secondary | ICD-10-CM | POA: Diagnosis not present

## 2013-09-14 DIAGNOSIS — M545 Low back pain, unspecified: Secondary | ICD-10-CM | POA: Diagnosis not present

## 2013-09-18 DIAGNOSIS — M542 Cervicalgia: Secondary | ICD-10-CM | POA: Diagnosis not present

## 2013-09-18 DIAGNOSIS — M545 Low back pain, unspecified: Secondary | ICD-10-CM | POA: Diagnosis not present

## 2013-09-21 DIAGNOSIS — E7800 Pure hypercholesterolemia, unspecified: Secondary | ICD-10-CM | POA: Diagnosis not present

## 2013-09-21 DIAGNOSIS — N390 Urinary tract infection, site not specified: Secondary | ICD-10-CM | POA: Diagnosis not present

## 2013-09-21 DIAGNOSIS — Z0000 Encounter for general adult medical examination without abnormal findings: Secondary | ICD-10-CM | POA: Diagnosis not present

## 2013-09-21 DIAGNOSIS — Z79899 Other long term (current) drug therapy: Secondary | ICD-10-CM | POA: Diagnosis not present

## 2013-09-22 DIAGNOSIS — M545 Low back pain, unspecified: Secondary | ICD-10-CM | POA: Diagnosis not present

## 2013-09-22 DIAGNOSIS — M542 Cervicalgia: Secondary | ICD-10-CM | POA: Diagnosis not present

## 2013-09-22 DIAGNOSIS — Z0000 Encounter for general adult medical examination without abnormal findings: Secondary | ICD-10-CM | POA: Diagnosis not present

## 2013-09-24 DIAGNOSIS — R059 Cough, unspecified: Secondary | ICD-10-CM | POA: Diagnosis not present

## 2013-09-24 DIAGNOSIS — R7309 Other abnormal glucose: Secondary | ICD-10-CM | POA: Diagnosis not present

## 2013-09-24 DIAGNOSIS — R05 Cough: Secondary | ICD-10-CM | POA: Diagnosis not present

## 2013-09-24 DIAGNOSIS — E7800 Pure hypercholesterolemia, unspecified: Secondary | ICD-10-CM | POA: Diagnosis not present

## 2013-09-24 DIAGNOSIS — Z1212 Encounter for screening for malignant neoplasm of rectum: Secondary | ICD-10-CM | POA: Diagnosis not present

## 2013-09-24 DIAGNOSIS — M1990 Unspecified osteoarthritis, unspecified site: Secondary | ICD-10-CM | POA: Diagnosis not present

## 2013-09-28 DIAGNOSIS — M545 Low back pain, unspecified: Secondary | ICD-10-CM | POA: Diagnosis not present

## 2013-09-28 DIAGNOSIS — M542 Cervicalgia: Secondary | ICD-10-CM | POA: Diagnosis not present

## 2013-10-05 DIAGNOSIS — M542 Cervicalgia: Secondary | ICD-10-CM | POA: Diagnosis not present

## 2013-10-05 DIAGNOSIS — M545 Low back pain, unspecified: Secondary | ICD-10-CM | POA: Diagnosis not present

## 2013-10-08 DIAGNOSIS — M545 Low back pain, unspecified: Secondary | ICD-10-CM | POA: Diagnosis not present

## 2013-10-08 DIAGNOSIS — M542 Cervicalgia: Secondary | ICD-10-CM | POA: Diagnosis not present

## 2013-10-12 DIAGNOSIS — M545 Low back pain, unspecified: Secondary | ICD-10-CM | POA: Diagnosis not present

## 2013-10-12 DIAGNOSIS — M542 Cervicalgia: Secondary | ICD-10-CM | POA: Diagnosis not present

## 2013-10-20 DIAGNOSIS — M542 Cervicalgia: Secondary | ICD-10-CM | POA: Diagnosis not present

## 2013-10-20 DIAGNOSIS — M545 Low back pain, unspecified: Secondary | ICD-10-CM | POA: Diagnosis not present

## 2014-06-21 DIAGNOSIS — G510 Bell's palsy: Secondary | ICD-10-CM | POA: Diagnosis not present

## 2014-08-02 DIAGNOSIS — Z23 Encounter for immunization: Secondary | ICD-10-CM | POA: Diagnosis not present

## 2014-10-21 DIAGNOSIS — Z0000 Encounter for general adult medical examination without abnormal findings: Secondary | ICD-10-CM | POA: Diagnosis not present

## 2014-10-21 DIAGNOSIS — E780 Pure hypercholesterolemia: Secondary | ICD-10-CM | POA: Diagnosis not present

## 2014-10-21 DIAGNOSIS — Z780 Asymptomatic menopausal state: Secondary | ICD-10-CM | POA: Diagnosis not present

## 2014-10-21 DIAGNOSIS — R739 Hyperglycemia, unspecified: Secondary | ICD-10-CM | POA: Diagnosis not present

## 2014-10-21 DIAGNOSIS — Z23 Encounter for immunization: Secondary | ICD-10-CM | POA: Diagnosis not present

## 2014-10-27 DIAGNOSIS — E780 Pure hypercholesterolemia: Secondary | ICD-10-CM | POA: Diagnosis not present

## 2014-10-27 DIAGNOSIS — R7309 Other abnormal glucose: Secondary | ICD-10-CM | POA: Diagnosis not present

## 2014-10-27 DIAGNOSIS — Z0000 Encounter for general adult medical examination without abnormal findings: Secondary | ICD-10-CM | POA: Diagnosis not present

## 2014-10-27 DIAGNOSIS — M159 Polyosteoarthritis, unspecified: Secondary | ICD-10-CM | POA: Diagnosis not present

## 2015-01-04 DIAGNOSIS — D239 Other benign neoplasm of skin, unspecified: Secondary | ICD-10-CM | POA: Diagnosis not present

## 2015-01-04 DIAGNOSIS — L821 Other seborrheic keratosis: Secondary | ICD-10-CM | POA: Diagnosis not present

## 2015-01-24 DIAGNOSIS — R739 Hyperglycemia, unspecified: Secondary | ICD-10-CM | POA: Diagnosis not present

## 2015-01-26 DIAGNOSIS — E1165 Type 2 diabetes mellitus with hyperglycemia: Secondary | ICD-10-CM | POA: Diagnosis not present

## 2015-02-10 DIAGNOSIS — E1165 Type 2 diabetes mellitus with hyperglycemia: Secondary | ICD-10-CM | POA: Diagnosis not present

## 2015-05-10 ENCOUNTER — Ambulatory Visit: Payer: Medicare Other | Admitting: Sports Medicine

## 2015-05-11 ENCOUNTER — Ambulatory Visit (INDEPENDENT_AMBULATORY_CARE_PROVIDER_SITE_OTHER): Payer: Medicare Other | Admitting: Sports Medicine

## 2015-05-11 ENCOUNTER — Encounter: Payer: Self-pay | Admitting: Sports Medicine

## 2015-05-11 ENCOUNTER — Ambulatory Visit: Payer: Medicare Other | Admitting: Sports Medicine

## 2015-05-11 VITALS — BP 123/46 | Ht 68.5 in | Wt 178.0 lb

## 2015-05-11 DIAGNOSIS — M7581 Other shoulder lesions, right shoulder: Secondary | ICD-10-CM

## 2015-05-11 DIAGNOSIS — M7582 Other shoulder lesions, left shoulder: Secondary | ICD-10-CM

## 2015-05-11 NOTE — Progress Notes (Signed)
Patient ID: Beth Macdonald, female   DOB: [birth year], 69 y.o.   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 Primary problem is pain in both shoulders. This is actually worse when lying down. The water activities do not make it worse. She really has minimal neck pain. 10 years ago she had rotator cuff impingement on the left and is down to rotator cuff exercises ever since. This improved but tends to flare periodically. She started the exercise on the right but actually has a little more pain on the right now as well. No recent injury.  Of note her meniscal problems that we saw her for 2 years ago are better. This flared up when she was trying to walk and do running in water. She would like start back walking but wonders if it is okay.  History of some triggering of right third finger  Physical examination No acute distress BP 123/46 mmHg  Ht 5' 8.5" (1.74 m)  Wt 178 lb (80.74 kg)  BMI 26.67 kg/m2  Shoulder: Inspection reveals forward roll of both shoulder but no  atrophy or asymmetry. Palpation is normal with no tenderness over AC joint or bicipital groove. ROM is full in all planes. Rotator cuff strength normal throughout. Mild pain but good strenth on impingement with negative Neer and Hawkin's tests, empty can. Speeds and Yergason's tests normal.  Probable labral pathology noted with + Obrien's - lef tis + both positons, negative clunk and good stability. Normal scapular function observed. No painful arc and no drop arm sign. No apprehension sign  Mild crepitation on movement of left shoulder   Assess  For trigger finger try some hand massage and ball squeeze Reassess if not resolving  For leg and knee pain - resume some walking but moderate this based on amount of running in water and overall water exercise 2 to 3 x per week

## 2015-05-11 NOTE — Assessment & Plan Note (Signed)
Her rotator cuff symptoms primarily relate to inward rotation of the shoulder with impingement We suggested a series of scapular stabilization exercises Postural stretches  On examination she has signs of degenerative labrum but I think that can improve with the posture exercises  After 6 weeks of a home exercise program I want to reevaluate this  If not improving I want to scan her rotator cuff but I think her strength was excellent today

## 2015-05-31 DIAGNOSIS — E1165 Type 2 diabetes mellitus with hyperglycemia: Secondary | ICD-10-CM | POA: Diagnosis not present

## 2015-06-02 DIAGNOSIS — Z23 Encounter for immunization: Secondary | ICD-10-CM | POA: Diagnosis not present

## 2015-06-02 DIAGNOSIS — E1165 Type 2 diabetes mellitus with hyperglycemia: Secondary | ICD-10-CM | POA: Diagnosis not present

## 2015-06-21 ENCOUNTER — Encounter: Payer: Self-pay | Admitting: Sports Medicine

## 2015-06-21 ENCOUNTER — Ambulatory Visit (INDEPENDENT_AMBULATORY_CARE_PROVIDER_SITE_OTHER): Payer: Medicare Other | Admitting: Sports Medicine

## 2015-06-21 VITALS — BP 116/67 | Ht 68.5 in | Wt 175.0 lb

## 2015-06-21 DIAGNOSIS — M7551 Bursitis of right shoulder: Secondary | ICD-10-CM

## 2015-06-21 DIAGNOSIS — M7581 Other shoulder lesions, right shoulder: Secondary | ICD-10-CM

## 2015-06-21 DIAGNOSIS — M7582 Other shoulder lesions, left shoulder: Secondary | ICD-10-CM | POA: Diagnosis not present

## 2015-06-21 DIAGNOSIS — M75101 Unspecified rotator cuff tear or rupture of right shoulder, not specified as traumatic: Secondary | ICD-10-CM | POA: Insufficient documentation

## 2015-06-21 DIAGNOSIS — M75102 Unspecified rotator cuff tear or rupture of left shoulder, not specified as traumatic: Secondary | ICD-10-CM

## 2015-06-21 NOTE — Assessment & Plan Note (Signed)
She will try home exercises Modify use of lower resistance  Use current medicaitons - takes some Celebrex  We can use CSI if pain persists  ReCK 6 wks

## 2015-06-21 NOTE — Patient Instructions (Signed)
Do band exercises  You have bursitis that actually sits over the subcapularis muscle  You also have some arthritis in the RT [facility] joint so be careful crossing over your chest too far  Celebrex is helpful for the pain  We could try other treatments if not resolving  Give this 4 to 6 weeks

## 2015-06-21 NOTE — Progress Notes (Signed)
Patient ID: Beth Macdonald, female   DOB: 09-21-1945, 69 y.o.   MRN: [id number]  Patient is a water aerobics instructor  Dizzy spells Hx of skull fx and concussion in past Neuro evaluation found post traumatic "dizzy spells" Some neck pain For 8 weeks dizzy spells worse Now have resolved after doing scap exercises and working on neck posture  RT shoulder Did rehab Still not better Doing her scap exercises Does feel stronger Stillhurts to cross over and reach for left arm   ROS: No fever, chills or recent infections. No other joints with swelling or redness.  PEXAM NAD/ facial palsy from [place] Bell's injury BP 116/67 mmHg  Ht 5' 8.5" (1.74 m)  Wt 175 lb (79.379 kg)  BMI 26.22 kg/m2  RT Shoulder: Inspection reveals no abnormalities, atrophy or asymmetry. Now with better psotural positon Palpation is normal except mild tenderness over AC joint and bicipital groove. ROM is full in all planes. Rotator cuff strength normal throughout. No signs of impingement with negative Neer and Hawkin's tests, empty can. Speeds and Yergason's tests normal.  Still with + Obrien's, negative clunk and good stability. Normal scapular function observed.today and this is improved No painful arc and no drop arm sign. No apprehension sign  [name] is still mildy painful  [place] RT shoulder Suprspinatus/ infraspinatus and TM are normal AC joint with mild DJD and with small effusion Biceps tendon normal Subscap tendon is compressed by swollen bursa with hypoechoic change 2 x 1 cm

## 2015-06-21 NOTE — Assessment & Plan Note (Signed)
This is improved with HEP but I think she is using a bit too much resistance in water on some exercises Using 5 lb weight and may be a bit heavy  Modify exercise and keep good ROM Prn ibuprofen

## 2015-07-20 DIAGNOSIS — E119 Type 2 diabetes mellitus without complications: Secondary | ICD-10-CM | POA: Diagnosis not present

## 2015-07-20 DIAGNOSIS — H1013 Acute atopic conjunctivitis, bilateral: Secondary | ICD-10-CM | POA: Diagnosis not present

## 2015-07-21 ENCOUNTER — Encounter: Payer: Self-pay | Admitting: Sports Medicine

## 2015-07-21 ENCOUNTER — Ambulatory Visit (INDEPENDENT_AMBULATORY_CARE_PROVIDER_SITE_OTHER): Payer: Medicare Other | Admitting: Sports Medicine

## 2015-07-21 VITALS — BP 155/65 | HR 62 | Ht 68.5 in | Wt 175.0 lb

## 2015-07-21 DIAGNOSIS — M7582 Other shoulder lesions, left shoulder: Secondary | ICD-10-CM | POA: Diagnosis not present

## 2015-07-21 DIAGNOSIS — M7581 Other shoulder lesions, right shoulder: Secondary | ICD-10-CM | POA: Diagnosis not present

## 2015-07-21 DIAGNOSIS — R42 Dizziness and giddiness: Secondary | ICD-10-CM | POA: Insufficient documentation

## 2015-07-21 NOTE — Assessment & Plan Note (Signed)
She has much beter scapula position  We will cont to work on scap stabilization and better posture  This has helped a lot

## 2015-07-21 NOTE — Assessment & Plan Note (Signed)
We will try self traction and postural exercises  Cont to work on uppper back  See if these relieve her sxs

## 2015-07-21 NOTE — Progress Notes (Signed)
Patient ID: Beth Macdonald, female   DOB: 09/05/1945, 69 y.o.   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  The patient reports that symptoms have improved with physical therapy and exercises given at the last appointment.  She still has some mild discomfort in the bilateral shoulders from her known RC tendinopathy of both.  Now sleeping better and higher quality of function throughout the day.  3/10 now.  Previously was 9-10/10.  Very happy with her rehab.    Brings up two other issues discussed at last appointment:  Neck pain: Reports still having occasional neck pain but has continued to improve with scapular strengthening / postural exercises.    Dizzy spells: Reports that with she continues to have "dizzy spells" but reports that these have improved with postural exercises.  The patient reports that she has had post-traumatic "dizzy spells" following her skull fracture and 2 concussions in her late 11s.    She states that her recent dizzy spells 3-4 months ago with a drastic "pounding" sensation within her head, associated with dizziness and sharp shoulder pain.  As her shoulder pain has improved, this debilitating "pounding" sensation in her head and associated dizzy spells have improved.    The most recent issues with dizziness is continuing to improve with shoulder/posture rehabilitation but still present throughout the day, just less intense.  Always originates on the superior occipital protuberance, just lateral to the inuion.  No changes with sitting/standing or turning the head quickly.   ROS:  -- 10-point ROS was unremarkable other than HPI above  Physical Exam: BP 155/65 mmHg  Pulse 62  Ht 5' 8.5" (1.74 m)  Wt 175 lb (79.379 kg)  BMI 26.22 kg/m2  General: NAD, resting comfortably Neuro: Unilateral facial nerve palsy (hx of Bell's).  Otherwise no focal deficits. Cardiopulmonary: No JVD.   Non-labored respirations.  MSK exam --- bilateral shoulders: Inspection reveals no abnormalities, atrophy or asymmetry. Posture improved, almost ideal now Palpation is non-tender with exception of mild TTP over RIGHT AC joint AROM is full in all planes for all cardinal movements of bilateral shoulders RC strength is 5/5 bilaterally in all cardinal movements NEGATIVE Neer, Kennedy-Hawkins, Speeds and Yergason's, Empty Can and O'Briends bilaterally.  Normal scapular function observed today (interval improvement)  Cervical exam (limited) Non-tender to palpation over the inion and superior and inferior nuchal ridges No paraspinal or trapezius muscle spasm   Assessment / Plan:  #1. Post-concussion HA disorder Suspect her dizziness is either degenerative change in C-spine causing some outlet compression of a peripheral nerve, late manifestation of post-concussive HA disorder or a combination of both.   Plan to perform self-traction and continue with posture improvement exercises.  #2. Bilateral RC tendinopathy Now resolved almost completely.  Continue maintenance exercises on days when not using the [facility] much/not doing much overhead activity.  Follow-up with sports medicine PRN.   ====

## 2015-08-02 ENCOUNTER — Ambulatory Visit: Payer: Medicare Other | Admitting: Sports Medicine

## 2015-09-19 DIAGNOSIS — E1165 Type 2 diabetes mellitus with hyperglycemia: Secondary | ICD-10-CM | POA: Diagnosis not present

## 2015-09-22 DIAGNOSIS — E119 Type 2 diabetes mellitus without complications: Secondary | ICD-10-CM | POA: Diagnosis not present

## 2015-10-06 ENCOUNTER — Ambulatory Visit (INDEPENDENT_AMBULATORY_CARE_PROVIDER_SITE_OTHER): Payer: Medicare Other | Admitting: Sports Medicine

## 2015-10-06 ENCOUNTER — Encounter: Payer: Self-pay | Admitting: Sports Medicine

## 2015-10-06 VITALS — BP 155/58 | HR 60 | Ht 68.0 in | Wt 178.0 lb

## 2015-10-06 DIAGNOSIS — M7582 Other shoulder lesions, left shoulder: Secondary | ICD-10-CM | POA: Diagnosis not present

## 2015-10-06 DIAGNOSIS — M7581 Other shoulder lesions, right shoulder: Secondary | ICD-10-CM

## 2015-10-07 ENCOUNTER — Ambulatory Visit
Admission: RE | Admit: 2015-10-07 | Discharge: 2015-10-07 | Disposition: A | Discharge status: Home / Self Care | Payer: Medicare Other | Source: Ambulatory Visit | Attending: Sports Medicine | Admitting: Sports Medicine

## 2015-10-07 DIAGNOSIS — M19011 Primary osteoarthritis, right shoulder: Secondary | ICD-10-CM | POA: Diagnosis not present

## 2015-10-07 DIAGNOSIS — M19012 Primary osteoarthritis, left shoulder: Secondary | ICD-10-CM | POA: Diagnosis not present

## 2015-10-07 DIAGNOSIS — M7581 Other shoulder lesions, right shoulder: Secondary | ICD-10-CM

## 2015-10-07 DIAGNOSIS — M7582 Other shoulder lesions, left shoulder: Secondary | ICD-10-CM

## 2015-10-07 NOTE — Assessment & Plan Note (Signed)
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

## 2015-10-07 NOTE — Progress Notes (Signed)
   Subjective:    Patient ID: Beth Macdonald, female    DOB: 13-Nov-1945, 70 y.o.   MRN: [id number]  CC:  Bilateral shoulder pain  HPI Patient has bilat shoulder pain Up to 3/10 at night Goes away whith exercise and water aerobics No pain w lifting [place] before showed some RC tendinopathy That responded well to Evangeline Pain pattern now is different and less severe  Some mild groin pain on RT  PMH/SOC HX/ Fam HX Still works as Press photographer No smoking Overall health very good  ROS Neck pain and dizziness in past resolved with HEP we gave her No gen. Joint swelling No numbness or tingling into hands   Review of Systems general No SOB No chest pain    Objective:   Physical Exam  NAD/ Ox3 BP 155/58 mmHg  Pulse 60  Ht 5\' 8"  (1.727 m)  Wt 178 lb (80.74 kg)  BMI 27.07 kg/m2  LT Shoulder: Inspection reveals no abnormalities, atrophy or asymmetry. Palpation is normal with no tenderness over AC joint or bicipital groove. + cross over test  ROM is full in all planes. Rotator cuff strength normal throughout. No signs of impingement with negative Neer and Hawkin's tests, empty can. Speeds and Yergason's tests normal. No labral pathology noted with negative Obrien's, negative clunk and good stability. Normal scapular function observed. No painful arc and no drop arm sign. No apprehension sign  RT shoulder Exam is similar Cross over + but not as much  Rt hip Full ROM No pain on testing      Assessment & Plan:  Bilat shoulder pain -- suspect this is from [facility] DJD seen on [place]  GRoin pain RT --based on exam I doubt OA - seems some adductor ttp  Suggest some easy hip ROM exercises in water

## 2015-11-08 ENCOUNTER — Encounter: Payer: Self-pay | Admitting: Sports Medicine

## 2015-11-08 ENCOUNTER — Ambulatory Visit (INDEPENDENT_AMBULATORY_CARE_PROVIDER_SITE_OTHER): Payer: Medicare Other | Admitting: Sports Medicine

## 2015-11-08 VITALS — BP 144/55 | Ht 68.0 in | Wt 178.0 lb

## 2015-11-08 DIAGNOSIS — M129 Arthropathy, unspecified: Secondary | ICD-10-CM

## 2015-11-08 DIAGNOSIS — M19011 Primary osteoarthritis, right shoulder: Secondary | ICD-10-CM | POA: Insufficient documentation

## 2015-11-08 DIAGNOSIS — M25561 Pain in right knee: Secondary | ICD-10-CM | POA: Diagnosis not present

## 2015-11-08 DIAGNOSIS — M19012 Primary osteoarthritis, left shoulder: Principal | ICD-10-CM

## 2015-11-08 NOTE — Progress Notes (Signed)
Patient ID: MARQUE PLAMONDON, female   DOB: June 13, 1946, 70 y.o.   MRN: [id number]  CC: follow up of bilateral shoulder pain  Shoulders:  Xrays and exam show bilateral AC joint arthritis;  She does have less pain with exercise modifications.   No pain on RC strength exercises.  Adductors : we modified her water exercise routine.  Removal of shoes in water caused much less stress over adductors.       No pain with rotation of hips.  Neck: postural positioning has lessened any pain  Exam Pleasant and NAD BP 144/55 mmHg  Ht 5\' 8"  (1.727 m)  Wt 178 lb (80.74 kg)  BMI 27.07 kg/m2  Hip joint testing ROM is 60 deg bilat on rotation No pain bilat Good hip flexion bilat Strength flexion normal Abduction normal Adduction is improved  Shoulder ROM Full ROM on all rotator cuff tests Cross over or adduction x chest cause pain over AC joints

## 2015-11-08 NOTE — Assessment & Plan Note (Signed)
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  Also reviewed Xrays with patient.

## 2015-11-08 NOTE — Assessment & Plan Note (Signed)
Discussed adductor exercise modification  Clarified that this is not in the hip joint but muscular

## 2015-11-14 DIAGNOSIS — E7800 Pure hypercholesterolemia, unspecified: Secondary | ICD-10-CM | POA: Diagnosis not present

## 2015-11-14 DIAGNOSIS — Z791 Long term (current) use of non-steroidal anti-inflammatories (NSAID): Secondary | ICD-10-CM | POA: Diagnosis not present

## 2015-11-14 DIAGNOSIS — E119 Type 2 diabetes mellitus without complications: Secondary | ICD-10-CM | POA: Diagnosis not present

## 2015-11-16 DIAGNOSIS — E1165 Type 2 diabetes mellitus with hyperglycemia: Secondary | ICD-10-CM | POA: Diagnosis not present

## 2015-11-16 DIAGNOSIS — M1990 Unspecified osteoarthritis, unspecified site: Secondary | ICD-10-CM | POA: Diagnosis not present

## 2015-11-16 DIAGNOSIS — E7800 Pure hypercholesterolemia, unspecified: Secondary | ICD-10-CM | POA: Diagnosis not present

## 2015-11-16 DIAGNOSIS — I8390 Asymptomatic varicose veins of unspecified lower extremity: Secondary | ICD-10-CM | POA: Diagnosis not present

## 2015-12-27 ENCOUNTER — Encounter: Payer: Self-pay | Admitting: Sports Medicine

## 2015-12-27 ENCOUNTER — Ambulatory Visit (INDEPENDENT_AMBULATORY_CARE_PROVIDER_SITE_OTHER): Payer: Medicare Other | Admitting: Sports Medicine

## 2015-12-27 VITALS — BP 120/57 | HR 64 | Ht 68.0 in | Wt 178.0 lb

## 2015-12-27 DIAGNOSIS — M19012 Primary osteoarthritis, left shoulder: Principal | ICD-10-CM

## 2015-12-27 DIAGNOSIS — M129 Arthropathy, unspecified: Secondary | ICD-10-CM | POA: Diagnosis not present

## 2015-12-27 DIAGNOSIS — R29898 Other symptoms and signs involving the musculoskeletal system: Secondary | ICD-10-CM | POA: Diagnosis not present

## 2015-12-27 DIAGNOSIS — M19011 Primary osteoarthritis, right shoulder: Secondary | ICD-10-CM

## 2015-12-27 NOTE — Progress Notes (Signed)
   Subjective:    Patient ID: Beth Macdonald, female    DOB: 1946-06-10, 70 y.o.   MRN: [id number]  HPI  70 yo female with b/l AC joint arthritis seen on Xrays, lower leg pain comes in for f/up.  Shoulders are feeling significantly better with the exercises.  AbDuctor muscles feel stronger now with the exercises. Now she is using ankle weight of 2.5 lb on both side for her AbDuctor exercises. Feels that Adductors are weak. Is able to get up from the chair without holding the handles now. Would like to strengthen her Quad and Adductors.   She did irritate RT adductor in kicking exercise in water aerobics    Review of Systems  Respiratory: Negative for chest tightness and shortness of breath.   Cardiovascular: Negative for chest pain and leg swelling.  Musculoskeletal: Negative for joint swelling, arthralgias, gait problem and neck pain.       Objective:   Physical Exam  Constitutional: She appears well-developed and well-nourished. No distress.  Eyes: Conjunctivae are normal. Right eye exhibits no discharge. Left eye exhibits no discharge.  Musculoskeletal: Normal range of motion.  Has full ROM of both shoulders. Scapula stabilization is good. 5/5 str bilaterally. No tenderness on AC joint.  ABductor muscle str is good today on both sides. Adductor str is good overall but right is weaker than left.   Hip normal ROM.   Skin: She is not diaphoretic.     Filed Vitals:   12/27/15 1445  BP: 120/57  Pulse: 64        Assessment & Plan:  See problem based a&p.

## 2015-12-27 NOTE — Assessment & Plan Note (Signed)
Feels that her pain is better now and so is her posture with the exercises. - cont prescribed exercise routine.

## 2015-12-27 NOTE — Assessment & Plan Note (Signed)
Feel her legs are stronger now. On exam her ABductors are much stronger. She has slight weakness on Left abductors. Also has subjective quad weakness.  - gave her ADductor and Quad exercises.

## 2016-01-09 DIAGNOSIS — E119 Type 2 diabetes mellitus without complications: Secondary | ICD-10-CM | POA: Diagnosis not present

## 2016-01-10 DIAGNOSIS — E119 Type 2 diabetes mellitus without complications: Secondary | ICD-10-CM | POA: Diagnosis not present

## 2016-01-26 DIAGNOSIS — L820 Inflamed seborrheic keratosis: Secondary | ICD-10-CM | POA: Diagnosis not present

## 2016-01-26 DIAGNOSIS — D485 Neoplasm of uncertain behavior of skin: Secondary | ICD-10-CM | POA: Diagnosis not present

## 2016-01-26 DIAGNOSIS — E1165 Type 2 diabetes mellitus with hyperglycemia: Secondary | ICD-10-CM | POA: Diagnosis not present

## 2016-02-02 DIAGNOSIS — E1165 Type 2 diabetes mellitus with hyperglycemia: Secondary | ICD-10-CM | POA: Diagnosis not present

## 2016-03-12 DIAGNOSIS — M25551 Pain in right hip: Secondary | ICD-10-CM | POA: Diagnosis not present

## 2016-03-12 DIAGNOSIS — M545 Low back pain: Secondary | ICD-10-CM | POA: Diagnosis not present

## 2016-03-12 DIAGNOSIS — M1991 Primary osteoarthritis, unspecified site: Secondary | ICD-10-CM | POA: Diagnosis not present

## 2016-03-19 DIAGNOSIS — M1991 Primary osteoarthritis, unspecified site: Secondary | ICD-10-CM | POA: Diagnosis not present

## 2016-03-19 DIAGNOSIS — M25551 Pain in right hip: Secondary | ICD-10-CM | POA: Diagnosis not present

## 2016-03-19 DIAGNOSIS — M545 Low back pain: Secondary | ICD-10-CM | POA: Diagnosis not present

## 2016-03-20 ENCOUNTER — Encounter: Payer: Self-pay | Admitting: Sports Medicine

## 2016-03-20 ENCOUNTER — Ambulatory Visit (INDEPENDENT_AMBULATORY_CARE_PROVIDER_SITE_OTHER): Payer: Medicare Other | Admitting: Sports Medicine

## 2016-03-20 VITALS — BP 117/62 | HR 64 | Ht 68.5 in | Wt 177.0 lb

## 2016-03-20 DIAGNOSIS — M129 Arthropathy, unspecified: Secondary | ICD-10-CM

## 2016-03-20 DIAGNOSIS — M7551 Bursitis of right shoulder: Secondary | ICD-10-CM | POA: Diagnosis not present

## 2016-03-20 DIAGNOSIS — M19012 Primary osteoarthritis, left shoulder: Principal | ICD-10-CM

## 2016-03-20 DIAGNOSIS — M19011 Primary osteoarthritis, right shoulder: Secondary | ICD-10-CM

## 2016-03-20 NOTE — Patient Instructions (Signed)
Large circular motion  Water glove pull - scapular slide  Water external rotation slide (breast stroke)  Change lawnmower/ scap push to the following  Up right row - 1 lb Low row - 1 lb  2 sets of 15  Hands in bye/ bye - external rotation with 1 lb weight while elevated 9 short arc) - 2 sets of 8

## 2016-03-20 NOTE — Assessment & Plan Note (Signed)
Most sxs arise form this  We modified work and exercise activity to lessen stress  Keep up Celebrex

## 2016-03-20 NOTE — Progress Notes (Signed)
Patient ID: KERRYN CADWALLADER, female   DOB: [birth year], 70 y.o.   MRN: [id number]  Patient w Shoulder pain  HPI Has improved w exercises over past yr. 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  tenderness over AC joint bilat Pain w resisted Xover test ROM is full in all planes. Rotator cuff strength normal throughout. No signs of impingement with negative Neer and Hawkin's tests, empty can. Speeds and Yergason's tests normal. No labral pathology noted with negative Obrien's, negative clunk and good stability. Normal scapular function observed. No painful arc and no drop arm sign. No apprehension sign  [place] Bilat DJD of AC joints Mild effusions Swollen subdeltoid bursa ant deltoid on RT RC tendons norm

## 2016-03-20 NOTE — Assessment & Plan Note (Signed)
Swelling noted again today  Try to reduce with ic celebrex bid for next week  CSI if not responding

## 2016-03-26 DIAGNOSIS — M1991 Primary osteoarthritis, unspecified site: Secondary | ICD-10-CM | POA: Diagnosis not present

## 2016-03-26 DIAGNOSIS — M25551 Pain in right hip: Secondary | ICD-10-CM | POA: Diagnosis not present

## 2016-03-26 DIAGNOSIS — M545 Low back pain: Secondary | ICD-10-CM | POA: Diagnosis not present

## 2016-03-30 DIAGNOSIS — E1165 Type 2 diabetes mellitus with hyperglycemia: Secondary | ICD-10-CM | POA: Diagnosis not present

## 2016-04-02 DIAGNOSIS — M1991 Primary osteoarthritis, unspecified site: Secondary | ICD-10-CM | POA: Diagnosis not present

## 2016-04-02 DIAGNOSIS — M25551 Pain in right hip: Secondary | ICD-10-CM | POA: Diagnosis not present

## 2016-04-02 DIAGNOSIS — M545 Low back pain: Secondary | ICD-10-CM | POA: Diagnosis not present

## 2016-04-03 DIAGNOSIS — E119 Type 2 diabetes mellitus without complications: Secondary | ICD-10-CM | POA: Diagnosis not present

## 2016-04-09 DIAGNOSIS — M545 Low back pain: Secondary | ICD-10-CM | POA: Diagnosis not present

## 2016-04-09 DIAGNOSIS — M1991 Primary osteoarthritis, unspecified site: Secondary | ICD-10-CM | POA: Diagnosis not present

## 2016-04-09 DIAGNOSIS — M25551 Pain in right hip: Secondary | ICD-10-CM | POA: Diagnosis not present

## 2016-04-17 DIAGNOSIS — M25551 Pain in right hip: Secondary | ICD-10-CM | POA: Diagnosis not present

## 2016-04-17 DIAGNOSIS — M545 Low back pain: Secondary | ICD-10-CM | POA: Diagnosis not present

## 2016-04-17 DIAGNOSIS — M1991 Primary osteoarthritis, unspecified site: Secondary | ICD-10-CM | POA: Diagnosis not present

## 2016-04-18 ENCOUNTER — Encounter: Payer: Self-pay | Admitting: Family Medicine

## 2016-04-18 ENCOUNTER — Ambulatory Visit (INDEPENDENT_AMBULATORY_CARE_PROVIDER_SITE_OTHER): Payer: Medicare Other | Admitting: Family Medicine

## 2016-04-18 VITALS — BP 135/59 | HR 67 | Ht 68.0 in | Wt 177.0 lb

## 2016-04-18 DIAGNOSIS — M25512 Pain in left shoulder: Secondary | ICD-10-CM

## 2016-04-18 MED ORDER — METHYLPREDNISOLONE ACETATE 40 MG/ML IJ SUSP
40.0000 mg | Freq: Once | INTRAMUSCULAR | Status: AC
  Administered 2016-04-18: 40 mg via INTRA_ARTICULAR

## 2016-04-18 MED ORDER — METHYLPREDNISOLONE ACETATE 40 MG/ML IJ SUSP: 40.0000 mg | Freq: Once | INTRAMUSCULAR | Status: DC

## 2016-04-18 NOTE — Progress Notes (Signed)
  Beth Macdonald - 70 y.o. female MRN [id number]  Date of birth: [birth year]   Aspiration/Injection Procedure Note Beth Macdonald 12/26/1945  Please refer to the visit on 03/20/16 for HPI and PE.   Procedure: Injection Indication: subacromial bursitis causing   Procedure Details Consent: Risks of procedure as well as the alternatives and risks of each were explained to the (patient/caregiver).  Consent for procedure obtained. Time Out: Verified patient identification, verified procedure, site/side was marked, verified correct patient position, special equipment/implants available, medications/allergies/relevent history reviewed, required imaging and test results available.  Performed.  The area was cleaned with iodine and alcohol swabs.    The subacromial bursa was injected using 1 cc's of 40 mg Depomedrol and 4 cc's of 1% lidocaine with a 21 1 1/2" needle.  .     Patient did tolerate procedure well.

## 2016-04-18 NOTE — Patient Instructions (Addendum)
Thank you for coming in,   Please continue to perform your exercises after 48 hours after the injection.   Please do not have any lifting for the next 48 hours.   Please let us know if you have redness of your skin or fever.   Please feel free to call with any questions or concerns at any time, at [phone]. --Dr. Raeford Razor

## 2016-04-24 DIAGNOSIS — M1991 Primary osteoarthritis, unspecified site: Secondary | ICD-10-CM | POA: Diagnosis not present

## 2016-04-24 DIAGNOSIS — M545 Low back pain: Secondary | ICD-10-CM | POA: Diagnosis not present

## 2016-04-24 DIAGNOSIS — M25551 Pain in right hip: Secondary | ICD-10-CM | POA: Diagnosis not present

## 2016-05-29 ENCOUNTER — Ambulatory Visit (INDEPENDENT_AMBULATORY_CARE_PROVIDER_SITE_OTHER): Payer: Medicare Other | Admitting: Sports Medicine

## 2016-05-29 ENCOUNTER — Other Ambulatory Visit: Payer: Self-pay

## 2016-05-29 ENCOUNTER — Encounter: Payer: Self-pay | Admitting: Sports Medicine

## 2016-05-29 VITALS — BP 148/64

## 2016-05-29 DIAGNOSIS — M25561 Pain in right knee: Secondary | ICD-10-CM

## 2016-05-29 DIAGNOSIS — M179 Osteoarthritis of knee, unspecified: Secondary | ICD-10-CM | POA: Insufficient documentation

## 2016-05-29 DIAGNOSIS — M19011 Primary osteoarthritis, right shoulder: Secondary | ICD-10-CM

## 2016-05-29 DIAGNOSIS — M129 Arthropathy, unspecified: Secondary | ICD-10-CM | POA: Diagnosis not present

## 2016-05-29 DIAGNOSIS — G479 Sleep disorder, unspecified: Secondary | ICD-10-CM

## 2016-05-29 DIAGNOSIS — M19012 Primary osteoarthritis, left shoulder: Principal | ICD-10-CM

## 2016-05-29 DIAGNOSIS — M1711 Unilateral primary osteoarthritis, right knee: Secondary | ICD-10-CM | POA: Insufficient documentation

## 2016-05-29 MED ORDER — METHYLPREDNISOLONE ACETATE 40 MG/ML IJ SUSP
40.0000 mg | Freq: Once | INTRAMUSCULAR | Status: AC
  Administered 2016-05-29: 40 mg via INTRA_ARTICULAR

## 2016-05-29 MED ORDER — CYCLOBENZAPRINE HCL 5 MG PO TABS: 5.0000 mg | ORAL_TABLET | Freq: Every day | ORAL | 0 refills | Status: DC

## 2016-05-29 NOTE — Assessment & Plan Note (Signed)
Flexeril added QHS to help with pain and sleep disturbance.  Side effects reviewed.  Return in 1 month for follow up.

## 2016-05-29 NOTE — Progress Notes (Signed)
   Subjective:    Patient ID: Beth Macdonald, female    DOB: [birth year], 70 y.o.   MRN: [id number]  HPI Beth Macdonald is a 70 y.o. female that presents to [facility] for follow up on bilateral shoulder pain and bilaeral knee pain.    Shoulder pain She is s/p subacromial steroid injection into the left shoulder.  She reports moderate improvement in her bursitis symptoms on that side.  However, she reports that she has bilateral aching in her shoulders each night.  She reports that she wakes up in pain every morning.  She notes that she has known degenerative changes in her shoulders - AC joints.  She is taking Celebrex.  She has been performing HEP. She notes that it helps but is not resolving her issue.    ROS: She denies numbness, tingling, weakness.  No radiation to neck or down arms.  Knee pain Patient reports that she has had left knee pain for some time.  She has  RT  Knee pain today and points to the lateral aspect of her right knee. She notes that pain is worse getting up from a seated position.  She reports that pain is interfering with her ability to do water aerobics.  She is on Celebrex.    ROS She denies locking, popping, instability, swelling or erythema.   She reports that this has occurred in the past on left and improved with knee injection.  She would like to have this done today.  Review of Systems Per HPI    Objective:   Physical Exam Gen: awake, alert, well appearing female, NAD MSK: Full AROM, no effusion, 5/5 strength, negative thessaly. Skin: no erythema or ecchymosis Neuro: light touch sensation in tact  Ultrasound: Right  knee: with bone spur at the patella, appreciated best with minimal knee flexion.  Spur also appreciated along the lateral joint line during inspection of the lateral meniscus. Note small effusion. Meniscus bilaterally was intact.  Summary: [place] most consistent with early OA  Left shoulder: no hypoechoic change in bursa appreciated.   Degenerative changes appreciated at that [facility] joint.  Consent obtained and verified. Cleansed with alcohol x2. Topical analgesic spray: Ethyl chloride. Joint: Right knee Approached in typical fashion using lateral approach; we identified area of swelling in SP pouch and injected to that spot using [place] to guide location, Completed without difficulty Meds: Depo Medrol 40mg  1cc: Lidocaine 1% 3cc Needle: 25g 1.5 inch Aftercare instructions and Red flags advised.    Assessment & Plan:   Bilateral acromioclavicular joint arthritis No evidence of recurrence of bursitis on ultrasound.  Degenerative changes appreciated.  2 additional HEP added, see AVS.  Continue Celebrex.  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  Bone spurs appreciated on ultrasound today.  Corticosteroid knee injection performed w ultrasound guidance.  Patient tolerated procedure well.  Avoid aggravating activities for next week.  Follow up in 1 month.  Sleep disturbance Flexeril added QHS to help with pain and sleep disturbance.  Side effects reviewed.  Return in 1 month for follow up.  Beth Moree M. Lajuana Ripple, DO PGY-3, [facility]  I observed and examined the patient with the resident and agree with assessment and plan.  Note reviewed and modified by me.  Ila Mcgill, MD

## 2016-05-29 NOTE — Assessment & Plan Note (Addendum)
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  Bone spurs appreciated on ultrasound today.  Corticosteroid knee injection performed w ultrasound guidance.  Patient tolerated procedure well.  Avoid aggravating activities for next week.  Follow up in 1 month.

## 2016-05-29 NOTE — Assessment & Plan Note (Signed)
No evidence of recurrence of bursitis on ultrasound.  Degenerative changes appreciated.  2 additional HEP added, see AVS.  Continue Celebrex.

## 2016-05-29 NOTE — Patient Instructions (Addendum)
Home exercise program:  - Lock your elbows at your side.  Use 2 lb weights and rotate arms out and back in daily. - Stabilize your shoulder blades.  Use 1 lb weights and bring your arms out and above your head daily.  Follow up in 1 month.

## 2016-06-26 ENCOUNTER — Encounter: Payer: Self-pay | Admitting: Sports Medicine

## 2016-06-26 ENCOUNTER — Ambulatory Visit (INDEPENDENT_AMBULATORY_CARE_PROVIDER_SITE_OTHER): Payer: Medicare Other | Admitting: Sports Medicine

## 2016-06-26 ENCOUNTER — Ambulatory Visit: Payer: Medicare Other | Admitting: Sports Medicine

## 2016-06-26 DIAGNOSIS — M19011 Primary osteoarthritis, right shoulder: Secondary | ICD-10-CM | POA: Diagnosis not present

## 2016-06-26 DIAGNOSIS — M19012 Primary osteoarthritis, left shoulder: Secondary | ICD-10-CM

## 2016-06-26 NOTE — Progress Notes (Signed)
Chief complaint: Bilateral shoulder pain  All last visit we added some exercises for external rotation We modified exercises so they would not irritate the a.c. Joints With some external flies and some external rotations of both shoulders she had much less pain No nighttime pain on the right Rare nighttime pain on the left  Ultrasound scan that showed bilateral a.c. Joint arthritis Her rotator cuff has been strong  Social history Teaches regular water aerobics classes She last smoked in 2001  Review of systems No neck pain No radicular symptoms since last visit  Physical examination No acute distress BP 134/61   Pulse 71   Ht 5\' 8"  (1.727 m)   Wt 176 lb (79.8 kg)   BMI 26.76 kg/m   Shoulder: Bilateral Inspection reveals no abnormalities, atrophy or asymmetry. Palpation is normal with no tenderness over AC joint or bicipital groove. Crossover test does bother the a.c. Joint on both sides ROM is full in all planes. Rotator cuff strength normal throughout. No signs of impingement with negative Neer and Hawkin's tests, empty can. Speeds and Yergason's tests normal.   Normal scapular function observed - This is improved with better scapular position No painful arc and no drop arm sign. No apprehension sign

## 2016-06-26 NOTE — Assessment & Plan Note (Signed)
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 Joints  Celebrex continues to help and she takes one each morning  Continue home exercises and we'll check with me in 3 months

## 2016-07-03 DIAGNOSIS — E1165 Type 2 diabetes mellitus with hyperglycemia: Secondary | ICD-10-CM | POA: Diagnosis not present

## 2016-07-05 DIAGNOSIS — E119 Type 2 diabetes mellitus without complications: Secondary | ICD-10-CM | POA: Diagnosis not present

## 2016-07-05 DIAGNOSIS — Z23 Encounter for immunization: Secondary | ICD-10-CM | POA: Diagnosis not present

## 2016-07-11 DIAGNOSIS — E119 Type 2 diabetes mellitus without complications: Secondary | ICD-10-CM | POA: Diagnosis not present

## 2016-07-12 ENCOUNTER — Ambulatory Visit (INDEPENDENT_AMBULATORY_CARE_PROVIDER_SITE_OTHER): Payer: Medicare Other | Admitting: Sports Medicine

## 2016-07-12 ENCOUNTER — Encounter: Payer: Self-pay | Admitting: Sports Medicine

## 2016-07-12 VITALS — BP 136/87 | Ht 68.0 in | Wt 176.0 lb

## 2016-07-12 DIAGNOSIS — M19011 Primary osteoarthritis, right shoulder: Secondary | ICD-10-CM

## 2016-07-12 DIAGNOSIS — M6790 Unspecified disorder of synovium and tendon, unspecified site: Secondary | ICD-10-CM

## 2016-07-12 DIAGNOSIS — M19012 Primary osteoarthritis, left shoulder: Secondary | ICD-10-CM

## 2016-07-12 DIAGNOSIS — M67959 Unspecified disorder of synovium and tendon, unspecified thigh: Secondary | ICD-10-CM

## 2016-07-12 MED ORDER — CYCLOBENZAPRINE HCL 5 MG PO TABS: 5.0000 mg | ORAL_TABLET | Freq: Every day | ORAL | 2 refills | Status: DC

## 2016-07-12 NOTE — Progress Notes (Signed)
  Beth Macdonald - 70 y.o. female MRN [id number]  Date of birth: 12/04/1945  SUBJECTIVE:  Including CC & ROS.  CC: Patient is here for a follow-up of bilateral shoulder pain.    Patient reports doing her shoulder exercises regularly. States she is able to function okay during the day but the pain gets worse at night. States she continues to take Celebrex daily. Patient is also complaining of pain in her L groin region which started after she was doing a reverse plank pose during exercise. States she was previously advised to do standing hip flexion and abduction exercises which are causing her to have discomfort. Denies having any paresthesias or weakness in her leg. No other complaints.   ROS: No fever, chills, swelling, instability, numbness/tingling, redness, otherwise see HPI   PMHx - Updated and reviewed.  Contributory factors include: Negative PSHx - Updated and reviewed.  Contributory factors include:  Negative FHx - Updated and reviewed.  Contributory factors include:  Negative Social Hx - Updated and reviewed. Contributory factors include: Negative Medications - reviewed   PHYSICAL EXAM:  VS: BP:136/87  HR: bpm  TEMP: ( )  RESP:   HT:5\' 8"  (172.7 cm)   WT:176 lb (79.8 kg)  BMI:26.8 PHYSICAL EXAM: Gen: NAD, alert, cooperative with exam, well-appearing HEENT: clear conjunctiva  CV:  no edema, capillary refill brisk, normal rate Resp: non-labored Skin: no rashes, normal turgor  Neuro: no gross deficits.  Psych:  alert and oriented Msk: Shoulder: Bilateral Inspection - no abnormalities, atrophy, or asymmetry Palpation is normal with no tenderness over the [facility] joint or bicipital groove Pain elicited upon crossover test bilaterally Normal ROM of shoulder joints  Normal rotator cuff strength bilaterally No drop arm sign  Hip - Bilateral Normal ROM  Pain elicited upon active and passive hip flexion and abduction on the left.  ASSESSMENT & PLAN:   Bilateral  acromioclavicular joint arthritis Patient is complaining of pain at night. Symptoms are well controlled during the day.  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 Patient is not able to do standing hip exercises due to discomfort.  -Advised her to do the following exercises: Lying hip rotation: 10 reps, 2-3 sets Gentle lying hip flexion: 15 reps, 3 sets    No problem-specific Assessment & Plan notes found for this encounter.

## 2016-07-12 NOTE — Assessment & Plan Note (Signed)
We will try to manage by having her increase meds somewhat  Try adding flexeril at night  Keep up HEP

## 2016-07-12 NOTE — Patient Instructions (Addendum)
Ms. Ballas it was nice meeting you today.  -Continue doing your shoulder exercises  -Start taking Flexeril 5 mg at night. In you continue to have shoulder pain, you may increase the dose to Flexeril 10 mg at night.   Do the following hip exercises:  -Lying hip rotation: 10 reps, 2-3 sets  -Gentle lying hip flexion: 15 reps, 3 sets

## 2016-08-09 ENCOUNTER — Encounter: Payer: Self-pay | Admitting: Sports Medicine

## 2016-08-09 ENCOUNTER — Ambulatory Visit (INDEPENDENT_AMBULATORY_CARE_PROVIDER_SITE_OTHER): Payer: Medicare Other | Admitting: Sports Medicine

## 2016-08-09 DIAGNOSIS — M19012 Primary osteoarthritis, left shoulder: Secondary | ICD-10-CM

## 2016-08-09 DIAGNOSIS — M19011 Primary osteoarthritis, right shoulder: Secondary | ICD-10-CM | POA: Diagnosis not present

## 2016-08-09 MED ORDER — TRAMADOL HCL 50 MG PO TABS
50.0000 mg | ORAL_TABLET | Freq: Two times a day (BID) | ORAL | 2 refills | Status: DC | PRN
Start: 2016-08-09 — End: 2017-10-10

## 2016-08-09 NOTE — Assessment & Plan Note (Signed)
Chronic but presenting today with acute flare. No relief with Flexeril, suggesting, as shown on imaging, more a joint problem than muscle. Has made improvement with at home exercises over past two months, and is amenable to continuing exercises. Discussed trying Tramadol vs injection today to provide patient with some relief, especially to help her sleep at night. Patient wishes to try Tramadol first, then return if she is still having difficulty sleeping.  - Tramadol 50mg  BID  - Continue Celebrex - Consider injection if no improvement

## 2016-08-09 NOTE — Progress Notes (Addendum)
   Subjective:    Patient ID: Beth Macdonald, female    DOB: [birth year], 70 y.o.   MRN: [id number]  HPI  Patient presents for bilateral shoulder pain follow-up.   Patient previously diagnosed with bilateral acromioclavicular joint arthritis. She was started on a home exercise regimen and instructed to continue Celebrex and f/u in three months. Patient is back after only a month because her pain has not improved at all. She was also given Flexeril but said this did not help at all. Pain is particularly bad at night and awakens patient from sleep. She is also beginning to notice pain during the day now, which is new. Thinks the at home exercises have helped a little, but not as much as they used to. Celebrex helps with her general joint pain but does not improve shoulder pain. Pain is now beginning to limit her range of motion, especially when reaching behind her back or laterally. For example, she has pain when buckling her seatbelt. Has relief when reaching directly upwards. L shoulder hurts slightly worse than R. Feels that she may be slightly weaker now as she has recently had difficulty carrying groceries, which has never occurred before.  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 She is still exercising in the pool about six hours per week.   Review of Systems Denies numbness, tingling. Denies radiating of pain down arms or into back.     Objective:   Physical Exam  Constitutional: She is oriented to person, place, and time. She appears well-developed and well-nourished. No distress.  HENT:  Head: Normocephalic and atraumatic.  Pulmonary/Chest: Effort normal. No respiratory distress.  Neurological: She is alert and oriented to person, place, and time.  Skin: Skin is warm and dry.  Psychiatric: She has a normal mood and affect. Her behavior is normal.  MSK: Shoulders No asymmetry noted on inspection, however  patient does have anterior tilt of head./ neck flexion  Tenderness to palpation of AC joint bilaterally.  Limited ROM when reaching back and laterally - 2/2 pain RC testing was normal        Assessment & Plan:  Bilateral acromioclavicular joint arthritis Chronic but presenting today with acute flare. No relief with Flexeril, suggesting, as shown on imaging, more a joint problem than muscle. Has made improvement with at home exercises over past two months, and is amenable to continuing exercises. Discussed trying Tramadol vs injection today to provide patient with some relief, especially to help her sleep at night. Patient wishes to try Tramadol first, then return if she is still having difficulty sleeping.  - Tramadol 50mg  BID  - Continue Celebrex - Consider injection if no improvement   Adin Hector, MD, MPH PGY-2 [facility] Pager [phone]  I observed and examined the patient with the resident and agree with assessment and plan.  Note reviewed and modified by me. Stefanie Libel, MD

## 2016-08-09 NOTE — Patient Instructions (Addendum)
It was nice meeting you today Ms. Puchalski!  For your shoulder pain, please begin taking Tramadol 50 mg in the morning and at night. Please continue to do your at home exercises and water exercises, and continue to take Celebrex as you have been.   If your pain continues despite the Tramadol, please schedule another appointment and we can consider steroid injections.   If you have any questions or concerns, please feel free to call the clinic.   Be well,  Dr. Avon Gully

## 2016-08-15 ENCOUNTER — Ambulatory Visit (INDEPENDENT_AMBULATORY_CARE_PROVIDER_SITE_OTHER): Payer: Medicare Other | Admitting: Sports Medicine

## 2016-08-15 ENCOUNTER — Encounter: Payer: Self-pay | Admitting: Sports Medicine

## 2016-08-15 DIAGNOSIS — M19012 Primary osteoarthritis, left shoulder: Secondary | ICD-10-CM

## 2016-08-15 DIAGNOSIS — M19011 Primary osteoarthritis, right shoulder: Secondary | ICD-10-CM

## 2016-08-15 MED ORDER — METHYLPREDNISOLONE ACETATE 40 MG/ML IJ SUSP
40.0000 mg | Freq: Once | INTRAMUSCULAR | Status: AC
  Administered 2016-08-15: 40 mg via INTRA_ARTICULAR

## 2016-08-15 NOTE — Assessment & Plan Note (Signed)
She had demonstrated osteoarthritis of the acromioclavicular joint on previous ultrasounds as well as ultrasounds today. There was a mushroom sign on each chromium clavicular joint. She may not be able to tolerate the tramadol so she will try to avoid this going forward. - Bilateral acromioclavicular joint injections today. - She will follow-up as needed.

## 2016-08-15 NOTE — Progress Notes (Signed)
Beth Macdonald - 70 y.o. female MRN [id number]  Date of birth: 05/06/1946  SUBJECTIVE:  Including CC & ROS.   Beth Macdonald is a 70 year old female that is presenting with bilateral acromioclavicular joint pain. She was seen last week and provided with tramadol but that did not help her pain as she did not tolerate the medication. She reports the pain is becoming a daily occurrence now. It was initially just at night. The pain is described as a dull ache in nature. It has been present for roughly 6 months. She received a chromium clavicular joint injection about 5 months ago and that seemed to relieve her pain for some time. She takes Celebrex on a regular occurrence. She has not tried any topical anti-inflammatory. She has not had any prior injury to this area. 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  ROS: No unexpected weight loss, fever, chills, swelling, instability, muscle pain, numbness/tingling, redness, otherwise see HPI    HISTORY: Past Medical, Surgical, Social, and Family History Reviewed & Updated per EMR.   Pertinent Historical Findings include: PMSHx -  Dm2  DATA REVIEWED: Previous ultrasounds   PHYSICAL EXAM:  VS: BP:118/66  HR: bpm  TEMP: ( )  RESP:   HT:5\' 8"  (172.7 cm)   WT:176 lb (79.8 kg)  BMI:26.8 PHYSICAL EXAM: Gen: NAD, alert, cooperative with exam, well-appearing HEENT: clear conjunctiva, EOMI CV:  no edema, capillary refill brisk,  Resp: non-labored, normal speech Skin: no rashes, normal turgor  Neuro: no gross deficits.  Psych:  alert and oriented Shoulder: Inspection reveals no abnormalities, atrophy or asymmetry. Palpation is normal with no tenderness or bicipital groove. Significant tenderness to the acromial clavicular joint bilaterally ROM is full in all planes. Rotator cuff strength normal throughout. No signs of impingement with negative Hawkin's tests, empty can sign. No painful arc and no drop arm  sign. No apprehension sign Neurovascularly intact   Aspiration/Injection Procedure Note Beth Macdonald 1945/12/21  Procedure: Injection Indications: left AC joint pain   Procedure Details Consent: Risks of procedure as well as the alternatives and risks of each were explained to the (patient/caregiver).  Consent for procedure obtained. Time Out: Verified patient identification, verified procedure, site/side was marked, verified correct patient position, special equipment/implants available, medications/allergies/relevent history reviewed, required imaging and test results available.  Performed.  The area was cleaned with iodine and alcohol swabs.    The left AC joint was injected using 1 cc's of 40 mg Depomedrol and 2 cc's of 1% lidocaine with a 25 1 1/2" needle.  Ultrasound was used. Images weren't obtained.   A sterile dressing was applied.  Patient did tolerate procedure well.   Aspiration/Injection Procedure Note Beth Macdonald 1945/12/17  Procedure: Injection Indications: Right AC joint pain   Procedure Details Consent: Risks of procedure as well as the alternatives and risks of each were explained to the (patient/caregiver).  Consent for procedure obtained. Time Out: Verified patient identification, verified procedure, site/side was marked, verified correct patient position, special equipment/implants available, medications/allergies/relevent history reviewed, required imaging and test results available.  Performed.  The area was cleaned with iodine and alcohol swabs.    The right AC joint was injected using 1 cc's of 40 mg Depomedrol and 2 cc's of 1% lidocaine with a 25 1 1/2" needle.  Ultrasound was used. Images weren't obtained.   A sterile dressing was applied.  Patient did tolerate procedure well.   ASSESSMENT & PLAN:  Bilateral acromioclavicular joint arthritis She had demonstrated osteoarthritis of the acromioclavicular joint on previous ultrasounds as  well as ultrasounds today. There was a mushroom sign on each chromium clavicular joint. She may not be able to tolerate the tramadol so she will try to avoid this going forward. - Bilateral acromioclavicular joint injections today. - She will follow-up as needed.

## 2016-10-22 DIAGNOSIS — E119 Type 2 diabetes mellitus without complications: Secondary | ICD-10-CM | POA: Diagnosis not present

## 2016-10-22 DIAGNOSIS — E7800 Pure hypercholesterolemia, unspecified: Secondary | ICD-10-CM | POA: Diagnosis not present

## 2016-10-22 DIAGNOSIS — E1165 Type 2 diabetes mellitus with hyperglycemia: Secondary | ICD-10-CM | POA: Diagnosis not present

## 2016-10-25 ENCOUNTER — Other Ambulatory Visit: Payer: Self-pay | Admitting: *Deleted

## 2016-10-25 ENCOUNTER — Encounter: Payer: Self-pay | Admitting: Sports Medicine

## 2016-10-25 ENCOUNTER — Ambulatory Visit (INDEPENDENT_AMBULATORY_CARE_PROVIDER_SITE_OTHER): Payer: Medicare Other | Admitting: Sports Medicine

## 2016-10-25 DIAGNOSIS — M19011 Primary osteoarthritis, right shoulder: Secondary | ICD-10-CM

## 2016-10-25 DIAGNOSIS — M19012 Primary osteoarthritis, left shoulder: Secondary | ICD-10-CM | POA: Diagnosis not present

## 2016-10-25 DIAGNOSIS — E119 Type 2 diabetes mellitus without complications: Secondary | ICD-10-CM | POA: Diagnosis not present

## 2016-10-25 NOTE — Progress Notes (Signed)
CC: Chronic shoulder pain  WE have modified her exercises However still has a lot of aching shoulder pain Not using resistance in water for arms  Celebrex helps Had side effects with tramadol US shows bilat AC joint DJD  ROS Denies neck pain No radicular sxs down arms  PE NAD BP (!) 146/72   Pulse 70   Ht 5\' 8"  (1.727 m)   Wt 176 lb (79.8 kg)   BMI 26.76 kg/m   Shoulder: RT and  Inspection reveals no abnormalities, atrophy or asymmetry. Palpation is mildly painful over AC joints not  TTP over bicipital groove. ROM is full in all planes. Rotator cuff strength normal throughout. No signs of impingement with negative Neer and Hawkin's tests, empty can. Speeds and Yergason's tests normal. No labral pathology noted with negative Obrien's, negative clunk and good stability. Normal scapular function observed. No painful arc and no drop arm sign. No apprehension sign

## 2016-10-25 NOTE — Patient Instructions (Signed)
You are having too much pain with your [facility] joint arthritis  Modify strength work to have arm away from body Lateral lifts 45 degree lifts Rotation but keep elbows away from body  2 key stretches H and T positions Wall stretches  Trial of topical medicine - Arnica gel or cream - do this 3 or 4 times a day

## 2016-10-25 NOTE — Assessment & Plan Note (Signed)
We will change exercises to some gentle work for rotator cuff in protected position  Add H/ T stretches Wall stretches  Topical arnica over AC joint  Injection helped for 6 weeks Will repeat if needed  Could consider NTG trial

## 2016-11-26 DIAGNOSIS — E7800 Pure hypercholesterolemia, unspecified: Secondary | ICD-10-CM | POA: Diagnosis not present

## 2016-11-26 DIAGNOSIS — M159 Polyosteoarthritis, unspecified: Secondary | ICD-10-CM | POA: Diagnosis not present

## 2016-11-26 DIAGNOSIS — I8390 Asymptomatic varicose veins of unspecified lower extremity: Secondary | ICD-10-CM | POA: Diagnosis not present

## 2016-11-26 DIAGNOSIS — M545 Low back pain: Secondary | ICD-10-CM | POA: Diagnosis not present

## 2016-11-26 DIAGNOSIS — Z1212 Encounter for screening for malignant neoplasm of rectum: Secondary | ICD-10-CM | POA: Diagnosis not present

## 2016-11-29 DIAGNOSIS — M2550 Pain in unspecified joint: Secondary | ICD-10-CM | POA: Diagnosis not present

## 2016-12-03 DIAGNOSIS — M19012 Primary osteoarthritis, left shoulder: Secondary | ICD-10-CM | POA: Diagnosis not present

## 2016-12-03 DIAGNOSIS — M25531 Pain in right wrist: Secondary | ICD-10-CM | POA: Diagnosis not present

## 2016-12-03 DIAGNOSIS — M19011 Primary osteoarthritis, right shoulder: Secondary | ICD-10-CM | POA: Diagnosis not present

## 2016-12-04 ENCOUNTER — Other Ambulatory Visit: Payer: Self-pay | Admitting: Specialist

## 2016-12-04 DIAGNOSIS — M25512 Pain in left shoulder: Secondary | ICD-10-CM | POA: Diagnosis not present

## 2016-12-04 DIAGNOSIS — M25511 Pain in right shoulder: Secondary | ICD-10-CM | POA: Diagnosis not present

## 2016-12-10 DIAGNOSIS — M19011 Primary osteoarthritis, right shoulder: Secondary | ICD-10-CM | POA: Diagnosis not present

## 2016-12-10 DIAGNOSIS — M25531 Pain in right wrist: Secondary | ICD-10-CM | POA: Diagnosis not present

## 2016-12-10 DIAGNOSIS — Z1211 Encounter for screening for malignant neoplasm of colon: Secondary | ICD-10-CM | POA: Diagnosis not present

## 2016-12-10 DIAGNOSIS — M19012 Primary osteoarthritis, left shoulder: Secondary | ICD-10-CM | POA: Diagnosis not present

## 2016-12-10 DIAGNOSIS — Z1212 Encounter for screening for malignant neoplasm of rectum: Secondary | ICD-10-CM | POA: Diagnosis not present

## 2016-12-13 DIAGNOSIS — M25531 Pain in right wrist: Secondary | ICD-10-CM | POA: Diagnosis not present

## 2016-12-13 DIAGNOSIS — M19011 Primary osteoarthritis, right shoulder: Secondary | ICD-10-CM | POA: Diagnosis not present

## 2016-12-13 DIAGNOSIS — M19012 Primary osteoarthritis, left shoulder: Secondary | ICD-10-CM | POA: Diagnosis not present

## 2016-12-15 ENCOUNTER — Ambulatory Visit
Admission: RE | Admit: 2016-12-15 | Discharge: 2016-12-15 | Disposition: A | Discharge status: Home / Self Care | Payer: Medicare Other | Source: Ambulatory Visit | Attending: Specialist | Admitting: Specialist

## 2016-12-15 DIAGNOSIS — M25512 Pain in left shoulder: Secondary | ICD-10-CM

## 2016-12-15 DIAGNOSIS — M19012 Primary osteoarthritis, left shoulder: Secondary | ICD-10-CM | POA: Diagnosis not present

## 2016-12-15 DIAGNOSIS — M25511 Pain in right shoulder: Secondary | ICD-10-CM

## 2016-12-15 DIAGNOSIS — M19011 Primary osteoarthritis, right shoulder: Secondary | ICD-10-CM | POA: Diagnosis not present

## 2016-12-18 DIAGNOSIS — M19011 Primary osteoarthritis, right shoulder: Secondary | ICD-10-CM | POA: Diagnosis not present

## 2016-12-18 DIAGNOSIS — M19012 Primary osteoarthritis, left shoulder: Secondary | ICD-10-CM | POA: Diagnosis not present

## 2016-12-18 DIAGNOSIS — M25531 Pain in right wrist: Secondary | ICD-10-CM | POA: Diagnosis not present

## 2016-12-20 ENCOUNTER — Encounter: Payer: Self-pay | Admitting: Sports Medicine

## 2016-12-20 ENCOUNTER — Ambulatory Visit (INDEPENDENT_AMBULATORY_CARE_PROVIDER_SITE_OTHER): Payer: Medicare Other | Admitting: Sports Medicine

## 2016-12-20 VITALS — BP 134/45 | Ht 68.0 in | Wt 176.0 lb

## 2016-12-20 DIAGNOSIS — M19012 Primary osteoarthritis, left shoulder: Secondary | ICD-10-CM | POA: Diagnosis not present

## 2016-12-20 DIAGNOSIS — M75102 Unspecified rotator cuff tear or rupture of left shoulder, not specified as traumatic: Secondary | ICD-10-CM | POA: Diagnosis not present

## 2016-12-20 DIAGNOSIS — M19011 Primary osteoarthritis, right shoulder: Secondary | ICD-10-CM | POA: Diagnosis not present

## 2016-12-20 DIAGNOSIS — M75101 Unspecified rotator cuff tear or rupture of right shoulder, not specified as traumatic: Secondary | ICD-10-CM

## 2016-12-20 MED ORDER — METHYLPREDNISOLONE ACETATE 40 MG/ML IJ SUSP
40.0000 mg | Freq: Once | INTRAMUSCULAR | Status: AC
  Administered 2016-12-20: 40 mg via INTRA_ARTICULAR

## 2016-12-20 NOTE — Progress Notes (Signed)
CC: Bilateral shoulder pain  Patient still having a lot of pain Actually worse since my last visit with her Now pattern is not just [facility] but also in motions of RC  No new injury Pain limits ability to do water exercise Routine HEP [place]  Sent by her PCP to see Dr. Theda Sers at [facility] ortho He did bilat MRI Both shoulders show similar patterns Severe AC joint DJD Some partial RC tearing Bursal swelling  Comes for my thoughts about surgery  ROS Pain most nights wakens her Hurts to reach x body Hurts to reach behind back and overhead  PE Older F ijn NAD BP (!) 134/45   Ht 5\' 8"  (1.727 m)   Wt 176 lb (79.8 kg)   BMI 26.76 kg/m   Pain in xover test bilat Painful arc at 90 deg and higher elevation bilat IR and ER strong Pain on impingement motions Pain with resistance but I think weakness is only 2/2 pain  MRI reviewed Bursal swelling Severe AC joint DJD with spurs Partial RC tear primarily at [facility]  Procedure:  Injection of LT shoulder Consent obtained and verified. Time-out conducted. Noted no overlying erythema, induration, or other signs of local infection. Skin prepped in a sterile fashion. Topical analgesic spray: Ethyl chloride. Completed without difficulty. Posterior approach to subacromial space. Meds: 4cc lidocaine 1% and 1 cc solumedrol 40 Pain immediately improved suggesting accurate placement of the medication. Advised to call if fevers/chills, erythema, induration, drainage, or persistent bleeding.  Procedure:  Injection of RT shoulder Consent obtained and verified. Time-out conducted. Noted no overlying erythema, induration, or other signs of local infection. Skin prepped in a sterile fashion. Topical analgesic spray: Ethyl chloride. Completed without difficulty. Posterior approach to subacromial space. Meds: 4cc lidocaine 1% and 1 cc solumedrol 40 Pain immediately improved suggesting accurate placement of the medication. Advised to call if  fevers/chills, erythema, induration, drainage, or persistent bleeding.

## 2016-12-20 NOTE — Assessment & Plan Note (Signed)
Injections done today  I think she should keep up ROM and easy exercise  She may benefit from surgical decompression of space as a lot of AC joint spurring and impingement

## 2016-12-20 NOTE — Assessment & Plan Note (Signed)
Documented on MRI to be fairly severe and likely causing some RC tearing  She is seeing DR Theda Sers for surgical opinion and may want to get 2 opinions  May be a good options with her pain pattern

## 2016-12-20 NOTE — Patient Instructions (Signed)
I think the MRI shows a lot of AC joint arthritis as we noted before  The rotator cuff has areas of partial tears somewhat related to the arthritis.  You might respond to a surgical approach to remove much of the [facility] joint.  Whether other repair is needed, I would discuss with the surgeon.  Dr. Theda Sers has a lot of experience and you can discuss with him.  You asked me about other shoulder surgeons.  Two who specialize in shoulder: Dr Mardelle Matte Dr. Tamera Punt  We will give you injections today for pain relief - they don't heal but can settle down symptoms.

## 2016-12-25 DIAGNOSIS — D492 Neoplasm of unspecified behavior of bone, soft tissue, and skin: Secondary | ICD-10-CM | POA: Diagnosis not present

## 2016-12-25 DIAGNOSIS — D229 Melanocytic nevi, unspecified: Secondary | ICD-10-CM | POA: Diagnosis not present

## 2016-12-25 DIAGNOSIS — L821 Other seborrheic keratosis: Secondary | ICD-10-CM | POA: Diagnosis not present

## 2016-12-26 DIAGNOSIS — M19011 Primary osteoarthritis, right shoulder: Secondary | ICD-10-CM | POA: Diagnosis not present

## 2016-12-26 DIAGNOSIS — M19012 Primary osteoarthritis, left shoulder: Secondary | ICD-10-CM | POA: Diagnosis not present

## 2016-12-29 DIAGNOSIS — M75111 Incomplete rotator cuff tear or rupture of right shoulder, not specified as traumatic: Secondary | ICD-10-CM | POA: Diagnosis not present

## 2016-12-29 DIAGNOSIS — M75112 Incomplete rotator cuff tear or rupture of left shoulder, not specified as traumatic: Secondary | ICD-10-CM | POA: Diagnosis not present

## 2017-01-07 DIAGNOSIS — M25531 Pain in right wrist: Secondary | ICD-10-CM | POA: Diagnosis not present

## 2017-01-07 DIAGNOSIS — M19011 Primary osteoarthritis, right shoulder: Secondary | ICD-10-CM | POA: Diagnosis not present

## 2017-01-07 DIAGNOSIS — M19012 Primary osteoarthritis, left shoulder: Secondary | ICD-10-CM | POA: Diagnosis not present

## 2017-01-15 DIAGNOSIS — M19012 Primary osteoarthritis, left shoulder: Secondary | ICD-10-CM | POA: Diagnosis not present

## 2017-01-15 DIAGNOSIS — M75112 Incomplete rotator cuff tear or rupture of left shoulder, not specified as traumatic: Secondary | ICD-10-CM | POA: Diagnosis not present

## 2017-01-15 DIAGNOSIS — G8918 Other acute postprocedural pain: Secondary | ICD-10-CM | POA: Diagnosis not present

## 2017-01-15 DIAGNOSIS — M7542 Impingement syndrome of left shoulder: Secondary | ICD-10-CM | POA: Diagnosis not present

## 2017-01-22 DIAGNOSIS — R030 Elevated blood-pressure reading, without diagnosis of hypertension: Secondary | ICD-10-CM | POA: Diagnosis not present

## 2017-01-22 DIAGNOSIS — M25531 Pain in right wrist: Secondary | ICD-10-CM | POA: Diagnosis not present

## 2017-01-25 DIAGNOSIS — M19012 Primary osteoarthritis, left shoulder: Secondary | ICD-10-CM | POA: Diagnosis not present

## 2017-01-29 DIAGNOSIS — M25512 Pain in left shoulder: Secondary | ICD-10-CM | POA: Diagnosis not present

## 2017-01-29 DIAGNOSIS — M25612 Stiffness of left shoulder, not elsewhere classified: Secondary | ICD-10-CM | POA: Diagnosis not present

## 2017-01-29 DIAGNOSIS — M75112 Incomplete rotator cuff tear or rupture of left shoulder, not specified as traumatic: Secondary | ICD-10-CM | POA: Diagnosis not present

## 2017-01-31 DIAGNOSIS — M25512 Pain in left shoulder: Secondary | ICD-10-CM | POA: Diagnosis not present

## 2017-01-31 DIAGNOSIS — M75112 Incomplete rotator cuff tear or rupture of left shoulder, not specified as traumatic: Secondary | ICD-10-CM | POA: Diagnosis not present

## 2017-01-31 DIAGNOSIS — M25612 Stiffness of left shoulder, not elsewhere classified: Secondary | ICD-10-CM | POA: Diagnosis not present

## 2017-02-04 DIAGNOSIS — M25612 Stiffness of left shoulder, not elsewhere classified: Secondary | ICD-10-CM | POA: Diagnosis not present

## 2017-02-04 DIAGNOSIS — M25512 Pain in left shoulder: Secondary | ICD-10-CM | POA: Diagnosis not present

## 2017-02-04 DIAGNOSIS — M75112 Incomplete rotator cuff tear or rupture of left shoulder, not specified as traumatic: Secondary | ICD-10-CM | POA: Diagnosis not present

## 2017-02-07 DIAGNOSIS — M25612 Stiffness of left shoulder, not elsewhere classified: Secondary | ICD-10-CM | POA: Diagnosis not present

## 2017-02-07 DIAGNOSIS — M25512 Pain in left shoulder: Secondary | ICD-10-CM | POA: Diagnosis not present

## 2017-02-07 DIAGNOSIS — M75112 Incomplete rotator cuff tear or rupture of left shoulder, not specified as traumatic: Secondary | ICD-10-CM | POA: Diagnosis not present

## 2017-02-11 DIAGNOSIS — M25512 Pain in left shoulder: Secondary | ICD-10-CM | POA: Diagnosis not present

## 2017-02-11 DIAGNOSIS — M75112 Incomplete rotator cuff tear or rupture of left shoulder, not specified as traumatic: Secondary | ICD-10-CM | POA: Diagnosis not present

## 2017-02-11 DIAGNOSIS — M25612 Stiffness of left shoulder, not elsewhere classified: Secondary | ICD-10-CM | POA: Diagnosis not present

## 2017-02-13 DIAGNOSIS — M25612 Stiffness of left shoulder, not elsewhere classified: Secondary | ICD-10-CM | POA: Diagnosis not present

## 2017-02-13 DIAGNOSIS — M75112 Incomplete rotator cuff tear or rupture of left shoulder, not specified as traumatic: Secondary | ICD-10-CM | POA: Diagnosis not present

## 2017-02-13 DIAGNOSIS — M25512 Pain in left shoulder: Secondary | ICD-10-CM | POA: Diagnosis not present

## 2017-02-18 DIAGNOSIS — M75112 Incomplete rotator cuff tear or rupture of left shoulder, not specified as traumatic: Secondary | ICD-10-CM | POA: Diagnosis not present

## 2017-02-18 DIAGNOSIS — M25512 Pain in left shoulder: Secondary | ICD-10-CM | POA: Diagnosis not present

## 2017-02-18 DIAGNOSIS — M25612 Stiffness of left shoulder, not elsewhere classified: Secondary | ICD-10-CM | POA: Diagnosis not present

## 2017-02-20 DIAGNOSIS — M25512 Pain in left shoulder: Secondary | ICD-10-CM | POA: Diagnosis not present

## 2017-02-20 DIAGNOSIS — M75112 Incomplete rotator cuff tear or rupture of left shoulder, not specified as traumatic: Secondary | ICD-10-CM | POA: Diagnosis not present

## 2017-02-20 DIAGNOSIS — M25612 Stiffness of left shoulder, not elsewhere classified: Secondary | ICD-10-CM | POA: Diagnosis not present

## 2017-02-25 DIAGNOSIS — M25512 Pain in left shoulder: Secondary | ICD-10-CM | POA: Diagnosis not present

## 2017-02-25 DIAGNOSIS — M75112 Incomplete rotator cuff tear or rupture of left shoulder, not specified as traumatic: Secondary | ICD-10-CM | POA: Diagnosis not present

## 2017-02-25 DIAGNOSIS — M25612 Stiffness of left shoulder, not elsewhere classified: Secondary | ICD-10-CM | POA: Diagnosis not present

## 2017-02-27 DIAGNOSIS — M25512 Pain in left shoulder: Secondary | ICD-10-CM | POA: Diagnosis not present

## 2017-02-27 DIAGNOSIS — M75112 Incomplete rotator cuff tear or rupture of left shoulder, not specified as traumatic: Secondary | ICD-10-CM | POA: Diagnosis not present

## 2017-02-27 DIAGNOSIS — M25612 Stiffness of left shoulder, not elsewhere classified: Secondary | ICD-10-CM | POA: Diagnosis not present

## 2017-03-01 DIAGNOSIS — Z9889 Other specified postprocedural states: Secondary | ICD-10-CM | POA: Diagnosis not present

## 2017-03-04 DIAGNOSIS — M25612 Stiffness of left shoulder, not elsewhere classified: Secondary | ICD-10-CM | POA: Diagnosis not present

## 2017-03-04 DIAGNOSIS — M25512 Pain in left shoulder: Secondary | ICD-10-CM | POA: Diagnosis not present

## 2017-03-04 DIAGNOSIS — M75112 Incomplete rotator cuff tear or rupture of left shoulder, not specified as traumatic: Secondary | ICD-10-CM | POA: Diagnosis not present

## 2017-03-08 DIAGNOSIS — M25512 Pain in left shoulder: Secondary | ICD-10-CM | POA: Diagnosis not present

## 2017-03-08 DIAGNOSIS — M25612 Stiffness of left shoulder, not elsewhere classified: Secondary | ICD-10-CM | POA: Diagnosis not present

## 2017-03-08 DIAGNOSIS — M75112 Incomplete rotator cuff tear or rupture of left shoulder, not specified as traumatic: Secondary | ICD-10-CM | POA: Diagnosis not present

## 2017-03-11 DIAGNOSIS — M75112 Incomplete rotator cuff tear or rupture of left shoulder, not specified as traumatic: Secondary | ICD-10-CM | POA: Diagnosis not present

## 2017-03-11 DIAGNOSIS — M25512 Pain in left shoulder: Secondary | ICD-10-CM | POA: Diagnosis not present

## 2017-03-11 DIAGNOSIS — M25612 Stiffness of left shoulder, not elsewhere classified: Secondary | ICD-10-CM | POA: Diagnosis not present

## 2017-03-13 DIAGNOSIS — M75112 Incomplete rotator cuff tear or rupture of left shoulder, not specified as traumatic: Secondary | ICD-10-CM | POA: Diagnosis not present

## 2017-03-13 DIAGNOSIS — M25512 Pain in left shoulder: Secondary | ICD-10-CM | POA: Diagnosis not present

## 2017-03-13 DIAGNOSIS — M25612 Stiffness of left shoulder, not elsewhere classified: Secondary | ICD-10-CM | POA: Diagnosis not present

## 2017-03-18 DIAGNOSIS — M75112 Incomplete rotator cuff tear or rupture of left shoulder, not specified as traumatic: Secondary | ICD-10-CM | POA: Diagnosis not present

## 2017-03-18 DIAGNOSIS — M25612 Stiffness of left shoulder, not elsewhere classified: Secondary | ICD-10-CM | POA: Diagnosis not present

## 2017-03-18 DIAGNOSIS — M25512 Pain in left shoulder: Secondary | ICD-10-CM | POA: Diagnosis not present

## 2017-03-20 DIAGNOSIS — M25612 Stiffness of left shoulder, not elsewhere classified: Secondary | ICD-10-CM | POA: Diagnosis not present

## 2017-03-20 DIAGNOSIS — M25512 Pain in left shoulder: Secondary | ICD-10-CM | POA: Diagnosis not present

## 2017-03-20 DIAGNOSIS — M75112 Incomplete rotator cuff tear or rupture of left shoulder, not specified as traumatic: Secondary | ICD-10-CM | POA: Diagnosis not present

## 2017-03-25 DIAGNOSIS — M25612 Stiffness of left shoulder, not elsewhere classified: Secondary | ICD-10-CM | POA: Diagnosis not present

## 2017-03-25 DIAGNOSIS — M75112 Incomplete rotator cuff tear or rupture of left shoulder, not specified as traumatic: Secondary | ICD-10-CM | POA: Diagnosis not present

## 2017-03-25 DIAGNOSIS — M25512 Pain in left shoulder: Secondary | ICD-10-CM | POA: Diagnosis not present

## 2017-03-27 DIAGNOSIS — M25612 Stiffness of left shoulder, not elsewhere classified: Secondary | ICD-10-CM | POA: Diagnosis not present

## 2017-03-27 DIAGNOSIS — M25512 Pain in left shoulder: Secondary | ICD-10-CM | POA: Diagnosis not present

## 2017-03-27 DIAGNOSIS — M75112 Incomplete rotator cuff tear or rupture of left shoulder, not specified as traumatic: Secondary | ICD-10-CM | POA: Diagnosis not present

## 2017-04-04 DIAGNOSIS — M75112 Incomplete rotator cuff tear or rupture of left shoulder, not specified as traumatic: Secondary | ICD-10-CM | POA: Diagnosis not present

## 2017-04-04 DIAGNOSIS — M25512 Pain in left shoulder: Secondary | ICD-10-CM | POA: Diagnosis not present

## 2017-04-04 DIAGNOSIS — M25612 Stiffness of left shoulder, not elsewhere classified: Secondary | ICD-10-CM | POA: Diagnosis not present

## 2017-04-08 DIAGNOSIS — M25612 Stiffness of left shoulder, not elsewhere classified: Secondary | ICD-10-CM | POA: Diagnosis not present

## 2017-04-08 DIAGNOSIS — M75112 Incomplete rotator cuff tear or rupture of left shoulder, not specified as traumatic: Secondary | ICD-10-CM | POA: Diagnosis not present

## 2017-04-08 DIAGNOSIS — M25512 Pain in left shoulder: Secondary | ICD-10-CM | POA: Diagnosis not present

## 2017-04-11 DIAGNOSIS — M7121 Synovial cyst of popliteal space [Baker], right knee: Secondary | ICD-10-CM | POA: Diagnosis not present

## 2017-04-11 DIAGNOSIS — M170 Bilateral primary osteoarthritis of knee: Secondary | ICD-10-CM | POA: Diagnosis not present

## 2017-04-11 DIAGNOSIS — R600 Localized edema: Secondary | ICD-10-CM | POA: Diagnosis not present

## 2017-04-11 DIAGNOSIS — G5620 Lesion of ulnar nerve, unspecified upper limb: Secondary | ICD-10-CM | POA: Diagnosis not present

## 2017-04-11 DIAGNOSIS — E7800 Pure hypercholesterolemia, unspecified: Secondary | ICD-10-CM | POA: Diagnosis not present

## 2017-04-12 DIAGNOSIS — Z9889 Other specified postprocedural states: Secondary | ICD-10-CM | POA: Diagnosis not present

## 2017-04-12 DIAGNOSIS — M75121 Complete rotator cuff tear or rupture of right shoulder, not specified as traumatic: Secondary | ICD-10-CM | POA: Diagnosis not present

## 2017-04-15 DIAGNOSIS — M75112 Incomplete rotator cuff tear or rupture of left shoulder, not specified as traumatic: Secondary | ICD-10-CM | POA: Diagnosis not present

## 2017-04-15 DIAGNOSIS — M25512 Pain in left shoulder: Secondary | ICD-10-CM | POA: Diagnosis not present

## 2017-04-15 DIAGNOSIS — M25612 Stiffness of left shoulder, not elsewhere classified: Secondary | ICD-10-CM | POA: Diagnosis not present

## 2017-04-16 DIAGNOSIS — E875 Hyperkalemia: Secondary | ICD-10-CM | POA: Diagnosis not present

## 2017-04-22 DIAGNOSIS — M25512 Pain in left shoulder: Secondary | ICD-10-CM | POA: Diagnosis not present

## 2017-04-22 DIAGNOSIS — M25612 Stiffness of left shoulder, not elsewhere classified: Secondary | ICD-10-CM | POA: Diagnosis not present

## 2017-04-22 DIAGNOSIS — M75112 Incomplete rotator cuff tear or rupture of left shoulder, not specified as traumatic: Secondary | ICD-10-CM | POA: Diagnosis not present

## 2017-04-29 DIAGNOSIS — M25512 Pain in left shoulder: Secondary | ICD-10-CM | POA: Diagnosis not present

## 2017-04-29 DIAGNOSIS — M25612 Stiffness of left shoulder, not elsewhere classified: Secondary | ICD-10-CM | POA: Diagnosis not present

## 2017-04-29 DIAGNOSIS — M75112 Incomplete rotator cuff tear or rupture of left shoulder, not specified as traumatic: Secondary | ICD-10-CM | POA: Diagnosis not present

## 2017-05-01 DIAGNOSIS — M25512 Pain in left shoulder: Secondary | ICD-10-CM | POA: Diagnosis not present

## 2017-05-01 DIAGNOSIS — M75112 Incomplete rotator cuff tear or rupture of left shoulder, not specified as traumatic: Secondary | ICD-10-CM | POA: Diagnosis not present

## 2017-05-01 DIAGNOSIS — D229 Melanocytic nevi, unspecified: Secondary | ICD-10-CM | POA: Diagnosis not present

## 2017-05-01 DIAGNOSIS — M25612 Stiffness of left shoulder, not elsewhere classified: Secondary | ICD-10-CM | POA: Diagnosis not present

## 2017-05-09 ENCOUNTER — Encounter: Payer: Self-pay | Admitting: Sports Medicine

## 2017-05-09 ENCOUNTER — Ambulatory Visit (INDEPENDENT_AMBULATORY_CARE_PROVIDER_SITE_OTHER): Payer: MEDICARE | Admitting: Sports Medicine

## 2017-05-09 DIAGNOSIS — M25561 Pain in right knee: Secondary | ICD-10-CM

## 2017-05-09 DIAGNOSIS — G8929 Other chronic pain: Secondary | ICD-10-CM

## 2017-05-09 NOTE — Patient Instructions (Signed)
Beth Macdonald, you were seen today for right knee pain and we found to have some arthritis in the lateral part of your right knee. We are recommending that you use a compression sleeve, stationary bike and continue with your aquatic exercises. Also continue your exercises to build up your quadriceps and ICE your knee as needed. Please follow up after your post-op period from your shoulder surgery and we can inject your knee if you are still symptomatic.   Very nice meeting you, Quillian Quince L. Rosalyn Gess, [place] Resident PGY-2 05/09/2017 7:34 PM

## 2017-05-09 NOTE — Progress Notes (Signed)
   [facility] [address] [place], [place] [address] Phone: [phone] Fax: [phone]   Patient Name: Beth Macdonald Date of Birth: [birth year] Medical Record Number: [id number] Gender: female Date of Encounter: 05/09/2017  History of Present Illness:  JACKI COUSE is a 71 y.o. very pleasant female patient who presents with the following:  Right sided knee pain for the past couple of weeks. Patient had shoulder surgery on 5/18 and ultimately developed some venous insufficiency due to inactivity over next couple of months. Around the first week of August patient had evaluation by PCP for worsening swelling and had LE doppler to r/o clot. There was no clot, but there was a 3.8x4.9x1.7cm bakers cyst. Patient reports no injuries to the knee, feels it may be swollen and endorses some intermittent feelings of instability and no popping or locking. Pain is worst with standing.   Past Medical, Surgical, Social, and Family History Reviewed. Medications and Allergies reviewed and all updated if necessary.  Review of Systems:  See HPI  Physical Examination: Vitals:   05/09/17 1529  BP: (!) 142/70   Vitals:   05/09/17 1529  Weight: 179 lb (81.2 kg)  Height: 5\' 8"  (1.727 m)   Body mass index is 27.22 kg/m.  General: well appearing 71yo F in NAD Cardiac: well perfused Resp: NWOB MSK:   Knee: no gross deformities or edema. Does have some increased warmth at suprapatellar region of right side compared to left side. Right knee flexion limited to 130 degrees, left with 140 degrees. Right knee with lateral joint line tenderness. Mcmurrays, lachman and varus/valgus stress difficulty to assess on right side secondary to pain. Patient doe have TTP in right posterior knee Neuro: grossly normal, no changes in sensation, strength 5/5 throughout lower extremities  Ultrasound of Knee Suprapatellar pouch shows small effusion Medial and lateral joint lines  with mild spurring Quadriceps tendon with small calcific spur off superior patella but intact Patellar tendon intact Degenerative change of meniscus medially and laterally with some calcification  Impression; findings consistent with early osteoarthritis, small effusion and some degenerative meniscal change  Ultrasound and interpretation by Wolfgang Phoenix. Fields, MD   Assessment and Plan: Right knee pain Patient with several weeks of right knee pain found to have lateral joint line tenderness, suprapatellar warmth and effusion on ultrasound as well as lateral compartment arthritis. She is due for shoulder surgery on 9/23 with Dr. Tamera Punt who has requested that she have no steroid injections prior to procedure. We recommend that patient wear compression sleeve, continue exercises to build up quadriceps, ice as needed and continue with stationary bike and aquatic exercises. She may follow up with [place] after her post-op period for reevaluation of her knee and we can consider injection if she continues to be symptomatic.   Roshon Duell L. Rosalyn Gess, [place] Resident PGY-2 05/09/2017 7:37 PM   I observed and examined the patient with the resident and agree with assessment and plan.  Note reviewed and modified by me. Stefanie Libel, MD

## 2017-05-10 NOTE — Assessment & Plan Note (Signed)
Findings consistent with early OA and degenerative meniscal change  We discussed strategies for conservative care  After she recovers from shoulder surgery we will consider more aggressive Tx if needed

## 2017-05-13 DIAGNOSIS — G5621 Lesion of ulnar nerve, right upper limb: Secondary | ICD-10-CM | POA: Diagnosis not present

## 2017-05-13 DIAGNOSIS — G5622 Lesion of ulnar nerve, left upper limb: Secondary | ICD-10-CM | POA: Diagnosis not present

## 2017-05-16 DIAGNOSIS — H1013 Acute atopic conjunctivitis, bilateral: Secondary | ICD-10-CM | POA: Diagnosis not present

## 2017-05-28 DIAGNOSIS — M75111 Incomplete rotator cuff tear or rupture of right shoulder, not specified as traumatic: Secondary | ICD-10-CM | POA: Diagnosis not present

## 2017-05-28 DIAGNOSIS — M25511 Pain in right shoulder: Secondary | ICD-10-CM | POA: Diagnosis not present

## 2017-05-28 DIAGNOSIS — M7541 Impingement syndrome of right shoulder: Secondary | ICD-10-CM | POA: Diagnosis not present

## 2017-05-28 DIAGNOSIS — M19011 Primary osteoarthritis, right shoulder: Secondary | ICD-10-CM | POA: Diagnosis not present

## 2017-06-05 DIAGNOSIS — M19011 Primary osteoarthritis, right shoulder: Secondary | ICD-10-CM | POA: Diagnosis not present

## 2017-06-11 DIAGNOSIS — M75112 Incomplete rotator cuff tear or rupture of left shoulder, not specified as traumatic: Secondary | ICD-10-CM | POA: Diagnosis not present

## 2017-06-11 DIAGNOSIS — M25512 Pain in left shoulder: Secondary | ICD-10-CM | POA: Diagnosis not present

## 2017-06-11 DIAGNOSIS — M25612 Stiffness of left shoulder, not elsewhere classified: Secondary | ICD-10-CM | POA: Diagnosis not present

## 2017-06-13 DIAGNOSIS — M75112 Incomplete rotator cuff tear or rupture of left shoulder, not specified as traumatic: Secondary | ICD-10-CM | POA: Diagnosis not present

## 2017-06-13 DIAGNOSIS — M25512 Pain in left shoulder: Secondary | ICD-10-CM | POA: Diagnosis not present

## 2017-06-13 DIAGNOSIS — M25612 Stiffness of left shoulder, not elsewhere classified: Secondary | ICD-10-CM | POA: Diagnosis not present

## 2017-06-17 DIAGNOSIS — M75112 Incomplete rotator cuff tear or rupture of left shoulder, not specified as traumatic: Secondary | ICD-10-CM | POA: Diagnosis not present

## 2017-06-17 DIAGNOSIS — M25612 Stiffness of left shoulder, not elsewhere classified: Secondary | ICD-10-CM | POA: Diagnosis not present

## 2017-06-17 DIAGNOSIS — M25512 Pain in left shoulder: Secondary | ICD-10-CM | POA: Diagnosis not present

## 2017-06-19 DIAGNOSIS — M75112 Incomplete rotator cuff tear or rupture of left shoulder, not specified as traumatic: Secondary | ICD-10-CM | POA: Diagnosis not present

## 2017-06-19 DIAGNOSIS — M25612 Stiffness of left shoulder, not elsewhere classified: Secondary | ICD-10-CM | POA: Diagnosis not present

## 2017-06-19 DIAGNOSIS — M25512 Pain in left shoulder: Secondary | ICD-10-CM | POA: Diagnosis not present

## 2017-06-24 DIAGNOSIS — M25512 Pain in left shoulder: Secondary | ICD-10-CM | POA: Diagnosis not present

## 2017-06-24 DIAGNOSIS — M25612 Stiffness of left shoulder, not elsewhere classified: Secondary | ICD-10-CM | POA: Diagnosis not present

## 2017-06-24 DIAGNOSIS — M75112 Incomplete rotator cuff tear or rupture of left shoulder, not specified as traumatic: Secondary | ICD-10-CM | POA: Diagnosis not present

## 2017-06-26 DIAGNOSIS — M75112 Incomplete rotator cuff tear or rupture of left shoulder, not specified as traumatic: Secondary | ICD-10-CM | POA: Diagnosis not present

## 2017-06-26 DIAGNOSIS — M25512 Pain in left shoulder: Secondary | ICD-10-CM | POA: Diagnosis not present

## 2017-06-26 DIAGNOSIS — M25612 Stiffness of left shoulder, not elsewhere classified: Secondary | ICD-10-CM | POA: Diagnosis not present

## 2017-07-01 DIAGNOSIS — M75112 Incomplete rotator cuff tear or rupture of left shoulder, not specified as traumatic: Secondary | ICD-10-CM | POA: Diagnosis not present

## 2017-07-01 DIAGNOSIS — M25512 Pain in left shoulder: Secondary | ICD-10-CM | POA: Diagnosis not present

## 2017-07-01 DIAGNOSIS — M25612 Stiffness of left shoulder, not elsewhere classified: Secondary | ICD-10-CM | POA: Diagnosis not present

## 2017-07-04 DIAGNOSIS — M25612 Stiffness of left shoulder, not elsewhere classified: Secondary | ICD-10-CM | POA: Diagnosis not present

## 2017-07-04 DIAGNOSIS — M75112 Incomplete rotator cuff tear or rupture of left shoulder, not specified as traumatic: Secondary | ICD-10-CM | POA: Diagnosis not present

## 2017-07-04 DIAGNOSIS — M25512 Pain in left shoulder: Secondary | ICD-10-CM | POA: Diagnosis not present

## 2017-07-08 DIAGNOSIS — M25612 Stiffness of left shoulder, not elsewhere classified: Secondary | ICD-10-CM | POA: Diagnosis not present

## 2017-07-08 DIAGNOSIS — M25512 Pain in left shoulder: Secondary | ICD-10-CM | POA: Diagnosis not present

## 2017-07-08 DIAGNOSIS — M75112 Incomplete rotator cuff tear or rupture of left shoulder, not specified as traumatic: Secondary | ICD-10-CM | POA: Diagnosis not present

## 2017-07-10 DIAGNOSIS — M25612 Stiffness of left shoulder, not elsewhere classified: Secondary | ICD-10-CM | POA: Diagnosis not present

## 2017-07-10 DIAGNOSIS — M25512 Pain in left shoulder: Secondary | ICD-10-CM | POA: Diagnosis not present

## 2017-07-10 DIAGNOSIS — M75112 Incomplete rotator cuff tear or rupture of left shoulder, not specified as traumatic: Secondary | ICD-10-CM | POA: Diagnosis not present

## 2017-07-15 DIAGNOSIS — M25612 Stiffness of left shoulder, not elsewhere classified: Secondary | ICD-10-CM | POA: Diagnosis not present

## 2017-07-15 DIAGNOSIS — M75112 Incomplete rotator cuff tear or rupture of left shoulder, not specified as traumatic: Secondary | ICD-10-CM | POA: Diagnosis not present

## 2017-07-15 DIAGNOSIS — M25512 Pain in left shoulder: Secondary | ICD-10-CM | POA: Diagnosis not present

## 2017-07-17 DIAGNOSIS — E1165 Type 2 diabetes mellitus with hyperglycemia: Secondary | ICD-10-CM | POA: Diagnosis not present

## 2017-07-17 DIAGNOSIS — M25612 Stiffness of left shoulder, not elsewhere classified: Secondary | ICD-10-CM | POA: Diagnosis not present

## 2017-07-17 DIAGNOSIS — M75112 Incomplete rotator cuff tear or rupture of left shoulder, not specified as traumatic: Secondary | ICD-10-CM | POA: Diagnosis not present

## 2017-07-17 DIAGNOSIS — M25512 Pain in left shoulder: Secondary | ICD-10-CM | POA: Diagnosis not present

## 2017-07-22 DIAGNOSIS — E119 Type 2 diabetes mellitus without complications: Secondary | ICD-10-CM | POA: Diagnosis not present

## 2017-07-22 DIAGNOSIS — M75112 Incomplete rotator cuff tear or rupture of left shoulder, not specified as traumatic: Secondary | ICD-10-CM | POA: Diagnosis not present

## 2017-07-22 DIAGNOSIS — M25612 Stiffness of left shoulder, not elsewhere classified: Secondary | ICD-10-CM | POA: Diagnosis not present

## 2017-07-22 DIAGNOSIS — M25512 Pain in left shoulder: Secondary | ICD-10-CM | POA: Diagnosis not present

## 2017-07-23 DIAGNOSIS — E119 Type 2 diabetes mellitus without complications: Secondary | ICD-10-CM | POA: Diagnosis not present

## 2017-07-24 DIAGNOSIS — M75112 Incomplete rotator cuff tear or rupture of left shoulder, not specified as traumatic: Secondary | ICD-10-CM | POA: Diagnosis not present

## 2017-07-24 DIAGNOSIS — M25612 Stiffness of left shoulder, not elsewhere classified: Secondary | ICD-10-CM | POA: Diagnosis not present

## 2017-07-24 DIAGNOSIS — M25512 Pain in left shoulder: Secondary | ICD-10-CM | POA: Diagnosis not present

## 2017-07-29 DIAGNOSIS — M75112 Incomplete rotator cuff tear or rupture of left shoulder, not specified as traumatic: Secondary | ICD-10-CM | POA: Diagnosis not present

## 2017-07-29 DIAGNOSIS — M25512 Pain in left shoulder: Secondary | ICD-10-CM | POA: Diagnosis not present

## 2017-07-29 DIAGNOSIS — M25612 Stiffness of left shoulder, not elsewhere classified: Secondary | ICD-10-CM | POA: Diagnosis not present

## 2017-08-01 DIAGNOSIS — M75112 Incomplete rotator cuff tear or rupture of left shoulder, not specified as traumatic: Secondary | ICD-10-CM | POA: Diagnosis not present

## 2017-08-01 DIAGNOSIS — M25512 Pain in left shoulder: Secondary | ICD-10-CM | POA: Diagnosis not present

## 2017-08-01 DIAGNOSIS — M25612 Stiffness of left shoulder, not elsewhere classified: Secondary | ICD-10-CM | POA: Diagnosis not present

## 2017-08-05 DIAGNOSIS — M75111 Incomplete rotator cuff tear or rupture of right shoulder, not specified as traumatic: Secondary | ICD-10-CM | POA: Diagnosis not present

## 2017-08-05 DIAGNOSIS — M25611 Stiffness of right shoulder, not elsewhere classified: Secondary | ICD-10-CM | POA: Diagnosis not present

## 2017-08-05 DIAGNOSIS — M25511 Pain in right shoulder: Secondary | ICD-10-CM | POA: Diagnosis not present

## 2017-08-06 ENCOUNTER — Encounter: Payer: Self-pay | Admitting: Sports Medicine

## 2017-08-06 ENCOUNTER — Ambulatory Visit: Payer: Self-pay

## 2017-08-06 ENCOUNTER — Ambulatory Visit (INDEPENDENT_AMBULATORY_CARE_PROVIDER_SITE_OTHER): Payer: MEDICARE | Admitting: Sports Medicine

## 2017-08-06 VITALS — BP 110/60 | Ht 68.0 in | Wt 179.0 lb

## 2017-08-06 DIAGNOSIS — M25572 Pain in left ankle and joints of left foot: Secondary | ICD-10-CM

## 2017-08-06 DIAGNOSIS — M25561 Pain in right knee: Secondary | ICD-10-CM

## 2017-08-06 DIAGNOSIS — G8929 Other chronic pain: Secondary | ICD-10-CM | POA: Diagnosis not present

## 2017-08-06 MED ORDER — METHYLPREDNISOLONE ACETATE 40 MG/ML IJ SUSP
40.0000 mg | Freq: Once | INTRAMUSCULAR | Status: AC
  Administered 2017-08-06: 40 mg via INTRA_ARTICULAR

## 2017-08-06 NOTE — Progress Notes (Signed)
Chief complaint: Running right knee pain  History of present illness: Beth Macdonald is a 71 year old female who presents to the sports medicine office today for follow-up of right knee pain. She was last seen here in the office about 3 months ago back on 05/09/17. At that time, her sound was done and did show small effusion in the suprapatellar pouch, with mild spurring and perimeniscal cyst at the lateral meniscus. Due to her getting rotator cuff repair about 10 weeks ago, she was not a candidate for cortisone injection at that time. Unfortunately, she has not had any improvement in her right knee pain. Today, she agrees to pain as a 7/10. She describes the pain as a sharp and stabbing pain. She points to the anterior aspect of her right knee near this appeared patellar pole as point of maximal tenderness. She also reports feeling fullness in the back of her knee. She reports that with the shoulder surgery that was done 10 weeks ago she has not been able to do any type of elliptical or stationary biking due to pressure on her shoulder. She reports also not being able to do any type of water aerobics. She reports mostly been sedentary. She feels frustrated because she has gained weight and her A1c has increased from 6.3-7. She reports that her metformin has been doubled. She is not report of any interval injury or trauma. She does report numbness, tingling, burning paresthesias in her feet at nighttime.  Review of systems:  As stated above  Interval past medical history, surgical history, family history, and social history obtained and unchanged.  Physical exam: Vital signs are reviewed and are documented in the chart Gen.: Alert, oriented, appears stated age, in no apparent distress HEENT: Moist oral mucosa Respiratory: Normal respirations, able to speak in full sentences Cardiac: Regular rate, distal pulses 2+ Integumentary: No rashes on visible skin:  Neurologic: Strength 5/5, sensation 2+ in bilateral  lower extremities Psych: Normal affect, mood is described as good Musculoskeletal: Inspection of right knee reveals no obvious deformity or muscle atrophy, she does have slight suprapatellar effusion, which categorizes is 1+, no warmth, erythema, ecchymosis noted, she is tender to palpation both medial lateral joint line of the right knee as well as near the suprapatellar pouch, no signs of ligamentous instability as Lachman, anterior drawer, valgus, and varus stress test negative, McMurray positive for crepitus, negative for pain, range of motion is from 0 to 130, equal on both sides  Limited musculoskeletal ultrasound was performed in the office of her right knee today. She was found to have a small effusion in the suprapatellar pouch. She was also found to have a more moderate to large Baker's cyst in the popliteal fossa of the right knee. It was also found to have some septations as well.  Procedure: Injection of RT knee baker cyst under ultrasound Consent obtained and verified. Time-out conducted. Noted no overlying erythema, induration, or other signs of local infection. Skin prepped in a sterile fashion. Topical analgesic spray: Ethyl chloride. 10 cc lidocaine 1% was used to anesthetize each side (superior and inferior) of the Baker's cyst Did attempt to aspirate Baker's cyst, but due to the viscosity of the cyst was not able to obtain any aspirate, despite ultrasound confirming accurate placement of needle. Subsequently, mixture of Depo-medrol and lidocaine 1% as noted below was injected into area around Baker's cyst. Completed without difficulty.  Meds: 1 cc Depo-medrol 40 and 4 cc lidocaine 1% Pain immediately improved suggesting accurate placement of  the medication. Advised to call if fevers/chills, erythema, induration, drainage, or persistent bleeding.  Assessment and plan: 1. Chronic right knee pain, with evidence of primary degenerative osteoarthritis of the right knee 2.  Moderate-sized Baker's cyst in right knee 3. Type I Diabetes  Plan: Given no interval improvement and with her not being able to be active, do feel next step would be cortisone injection into the right knee. Under ultrasound today she was found to have a moderate to large size Baker's cyst in the right popliteal fossa. Given this, felt that it would be best to inject cortisone into the area around Baker's cyst. Did try to aspirate some of the Baker's cyst, but was unsuccessful in doing so. Subsequently injected cortisone without any complications noted. Discussed to continue to use knee compressive sleeve, discussed quadriceps and hamstring strengthening exercises. Discussed cryotherapy. We'll have her follow-up in 4 weeks to see how she does or sooner as needed. Did give caveat this may transiently increase her blood sugar over the next 3-4 days and to be watchful of her blood glucose levels.   Mort Sawyers, M.D. [place]

## 2017-08-07 DIAGNOSIS — M75111 Incomplete rotator cuff tear or rupture of right shoulder, not specified as traumatic: Secondary | ICD-10-CM | POA: Diagnosis not present

## 2017-08-07 DIAGNOSIS — M25611 Stiffness of right shoulder, not elsewhere classified: Secondary | ICD-10-CM | POA: Diagnosis not present

## 2017-08-07 DIAGNOSIS — M25511 Pain in right shoulder: Secondary | ICD-10-CM | POA: Diagnosis not present

## 2017-08-14 DIAGNOSIS — M25511 Pain in right shoulder: Secondary | ICD-10-CM | POA: Diagnosis not present

## 2017-08-14 DIAGNOSIS — M25611 Stiffness of right shoulder, not elsewhere classified: Secondary | ICD-10-CM | POA: Diagnosis not present

## 2017-08-14 DIAGNOSIS — M75111 Incomplete rotator cuff tear or rupture of right shoulder, not specified as traumatic: Secondary | ICD-10-CM | POA: Diagnosis not present

## 2017-08-16 DIAGNOSIS — G629 Polyneuropathy, unspecified: Secondary | ICD-10-CM | POA: Diagnosis not present

## 2017-08-19 DIAGNOSIS — M25611 Stiffness of right shoulder, not elsewhere classified: Secondary | ICD-10-CM | POA: Diagnosis not present

## 2017-08-19 DIAGNOSIS — M75111 Incomplete rotator cuff tear or rupture of right shoulder, not specified as traumatic: Secondary | ICD-10-CM | POA: Diagnosis not present

## 2017-08-19 DIAGNOSIS — M25511 Pain in right shoulder: Secondary | ICD-10-CM | POA: Diagnosis not present

## 2017-08-21 DIAGNOSIS — M25611 Stiffness of right shoulder, not elsewhere classified: Secondary | ICD-10-CM | POA: Diagnosis not present

## 2017-08-21 DIAGNOSIS — M25511 Pain in right shoulder: Secondary | ICD-10-CM | POA: Diagnosis not present

## 2017-08-21 DIAGNOSIS — M75111 Incomplete rotator cuff tear or rupture of right shoulder, not specified as traumatic: Secondary | ICD-10-CM | POA: Diagnosis not present

## 2017-08-28 DIAGNOSIS — M75111 Incomplete rotator cuff tear or rupture of right shoulder, not specified as traumatic: Secondary | ICD-10-CM | POA: Diagnosis not present

## 2017-08-28 DIAGNOSIS — M25511 Pain in right shoulder: Secondary | ICD-10-CM | POA: Diagnosis not present

## 2017-08-28 DIAGNOSIS — M25611 Stiffness of right shoulder, not elsewhere classified: Secondary | ICD-10-CM | POA: Diagnosis not present

## 2017-09-03 HISTORY — PX: TOENAIL EXCISION: SUR558

## 2017-09-05 ENCOUNTER — Encounter: Payer: Self-pay | Admitting: Sports Medicine

## 2017-09-05 ENCOUNTER — Ambulatory Visit (INDEPENDENT_AMBULATORY_CARE_PROVIDER_SITE_OTHER): Payer: MEDICARE | Admitting: Sports Medicine

## 2017-09-05 DIAGNOSIS — M25561 Pain in right knee: Secondary | ICD-10-CM | POA: Diagnosis not present

## 2017-09-05 DIAGNOSIS — G8929 Other chronic pain: Secondary | ICD-10-CM | POA: Diagnosis not present

## 2017-09-05 NOTE — Assessment & Plan Note (Signed)
This has significantly improved CSI may have helped set this up for rupture This has relieved a lot of the pain  Try compression sleeve again Use rolling pin massage Water exercises  Reck 2 mos

## 2017-09-05 NOTE — Progress Notes (Signed)
Right Bakers Cyst  CSI on 08/06/17 We were unable to drain much fluid Mild pain relief first 2 weeks Able to restart water exercises  Using rolling pain to massage Calf and Hamstring Felt slight pop in back of knee Since has felt less tightness in knee and less pain Feels her motion is better  Second problem is pain sitting over Left ISH tub Remote HS strains  Ros No locking of knee Knee swellls with activity No sciatica  PE Pleasant F in NAD BP 138/62   Ht 5\' 8"  (1.727 m)   Wt 182 lb (82.6 kg)   BMI 27.67 kg/m   RT knee Some puffiness around joint line Probabluy small effusion Flexion painful at 130 deg Full extension Fullness but no clearly palpable cuyst  LT knee can get 135 deg flexion with crepitation but no pain Full extension  Hamstring testing reveals good strength bilaterally Mild TTP over RT IT  Ultrasound of Right Knee The Baker's cyst is much smaller - now about 2 cm length vs 1 cm width and depth Compressible  Ultrasound of Right Ischial tuberosity Some irregularity, calcification and a small traction spur

## 2017-09-09 DIAGNOSIS — M25511 Pain in right shoulder: Secondary | ICD-10-CM | POA: Diagnosis not present

## 2017-09-09 DIAGNOSIS — M25611 Stiffness of right shoulder, not elsewhere classified: Secondary | ICD-10-CM | POA: Diagnosis not present

## 2017-09-09 DIAGNOSIS — M75111 Incomplete rotator cuff tear or rupture of right shoulder, not specified as traumatic: Secondary | ICD-10-CM | POA: Diagnosis not present

## 2017-09-11 DIAGNOSIS — M25611 Stiffness of right shoulder, not elsewhere classified: Secondary | ICD-10-CM | POA: Diagnosis not present

## 2017-09-11 DIAGNOSIS — M75111 Incomplete rotator cuff tear or rupture of right shoulder, not specified as traumatic: Secondary | ICD-10-CM | POA: Diagnosis not present

## 2017-09-11 DIAGNOSIS — M25511 Pain in right shoulder: Secondary | ICD-10-CM | POA: Diagnosis not present

## 2017-09-16 DIAGNOSIS — M25511 Pain in right shoulder: Secondary | ICD-10-CM | POA: Diagnosis not present

## 2017-09-16 DIAGNOSIS — M75111 Incomplete rotator cuff tear or rupture of right shoulder, not specified as traumatic: Secondary | ICD-10-CM | POA: Diagnosis not present

## 2017-09-16 DIAGNOSIS — M25611 Stiffness of right shoulder, not elsewhere classified: Secondary | ICD-10-CM | POA: Diagnosis not present

## 2017-09-18 DIAGNOSIS — M25511 Pain in right shoulder: Secondary | ICD-10-CM | POA: Diagnosis not present

## 2017-09-18 DIAGNOSIS — M75111 Incomplete rotator cuff tear or rupture of right shoulder, not specified as traumatic: Secondary | ICD-10-CM | POA: Diagnosis not present

## 2017-09-18 DIAGNOSIS — M25611 Stiffness of right shoulder, not elsewhere classified: Secondary | ICD-10-CM | POA: Diagnosis not present

## 2017-09-23 DIAGNOSIS — M75111 Incomplete rotator cuff tear or rupture of right shoulder, not specified as traumatic: Secondary | ICD-10-CM | POA: Diagnosis not present

## 2017-09-23 DIAGNOSIS — M25511 Pain in right shoulder: Secondary | ICD-10-CM | POA: Diagnosis not present

## 2017-09-23 DIAGNOSIS — M25611 Stiffness of right shoulder, not elsewhere classified: Secondary | ICD-10-CM | POA: Diagnosis not present

## 2017-09-25 DIAGNOSIS — E7800 Pure hypercholesterolemia, unspecified: Secondary | ICD-10-CM | POA: Diagnosis not present

## 2017-09-25 DIAGNOSIS — R739 Hyperglycemia, unspecified: Secondary | ICD-10-CM | POA: Diagnosis not present

## 2017-09-26 DIAGNOSIS — G6289 Other specified polyneuropathies: Secondary | ICD-10-CM | POA: Diagnosis not present

## 2017-09-27 DIAGNOSIS — Z09 Encounter for follow-up examination after completed treatment for conditions other than malignant neoplasm: Secondary | ICD-10-CM | POA: Diagnosis not present

## 2017-10-10 ENCOUNTER — Encounter: Payer: Self-pay | Admitting: Sports Medicine

## 2017-10-10 ENCOUNTER — Ambulatory Visit (INDEPENDENT_AMBULATORY_CARE_PROVIDER_SITE_OTHER): Payer: MEDICARE | Admitting: Sports Medicine

## 2017-10-10 DIAGNOSIS — M25561 Pain in right knee: Secondary | ICD-10-CM

## 2017-10-10 DIAGNOSIS — G8929 Other chronic pain: Secondary | ICD-10-CM | POA: Diagnosis not present

## 2017-10-10 NOTE — Progress Notes (Signed)
   Subjective:    Patient ID: Beth Macdonald, female    DOB: 12-Sep-1945, 72 y.o.   MRN: [id number]  HPI Beth Macdonald is a 72 year old female who presents for follow up of her chronic right knee pain. She was last seen on 09/05/17 for this. At that time she reported decreased pain in her posterior right knee due to her baker's cyst. Since then, she reports that the pain on the superior lateral aspect of the knee has become increasingly worse. She feels that this area of her knee has been swollen for the past several weeks. Her pain is worst with walking and full extension, however she has been able to do her water aerobics and quad strengthening exercises. She denies any new injuries. Denies any erythema of the knee, fever, or any other systemic symptoms.     Review of Systems Negative except as stated above No locking or giving way    Objective:   Physical Exam General: Well appearing, no acute distress BP 130/72   Ht 5\' 8"  (1.727 m)   Wt 180 lb (81.6 kg)   BMI 27.37 kg/m   Right knee: Suprapatellar edema present. Mild tenderness to palpation of quad tendon, lateral and medial joint line. Full extension with pain. Flexion limited to 120 degrees. LCL and MCL intact, negative anterior and posterior drawer.   Bedside US performed, limited images of right knee obtained. Moderate suprapatellar pouch effusion present. Quad tendon is intact. Thinning of menisci, lateral>medial. Remnant of Baker's cyst present, significantly reduced in size compared to last [place].       Assessment & Plan:  72 year old female with history of chronic right knee pain. Evidence of bilateral knee OA seen on prior XRs, history of baker's cyst of right knee. Exam and US findings suggestive of effusion of right suprapatellar pouch, likely secondary to OA. Discussed management options with the patient. She will continue use of compression, ice, and strengthening exercises. Her last steroid injection was 08/06/17, therefore  she will follow up in 4-6 weeks and consider repeat US guided injection at that time.

## 2017-10-10 NOTE — Assessment & Plan Note (Signed)
Gets a fairly large effusion Baker's cyst is small now  Use compression when walking  Keep up HEP  Plan to do CSI  In 1 month and may repeat q 58mos for pain relief

## 2017-10-21 DIAGNOSIS — H1013 Acute atopic conjunctivitis, bilateral: Secondary | ICD-10-CM | POA: Diagnosis not present

## 2017-10-28 DIAGNOSIS — E1165 Type 2 diabetes mellitus with hyperglycemia: Secondary | ICD-10-CM | POA: Diagnosis not present

## 2017-10-28 DIAGNOSIS — E119 Type 2 diabetes mellitus without complications: Secondary | ICD-10-CM | POA: Diagnosis not present

## 2017-11-04 DIAGNOSIS — H04121 Dry eye syndrome of right lacrimal gland: Secondary | ICD-10-CM | POA: Diagnosis not present

## 2017-11-07 ENCOUNTER — Ambulatory Visit: Payer: MEDICARE | Admitting: Sports Medicine

## 2017-11-14 ENCOUNTER — Ambulatory Visit: Payer: MEDICARE | Admitting: Sports Medicine

## 2017-11-15 DIAGNOSIS — Z791 Long term (current) use of non-steroidal anti-inflammatories (NSAID): Secondary | ICD-10-CM | POA: Diagnosis not present

## 2017-11-15 DIAGNOSIS — R197 Diarrhea, unspecified: Secondary | ICD-10-CM | POA: Diagnosis not present

## 2017-11-15 DIAGNOSIS — E1165 Type 2 diabetes mellitus with hyperglycemia: Secondary | ICD-10-CM | POA: Diagnosis not present

## 2017-11-15 DIAGNOSIS — M159 Polyosteoarthritis, unspecified: Secondary | ICD-10-CM | POA: Diagnosis not present

## 2017-11-15 DIAGNOSIS — J399 Disease of upper respiratory tract, unspecified: Secondary | ICD-10-CM | POA: Diagnosis not present

## 2017-11-19 DIAGNOSIS — R197 Diarrhea, unspecified: Secondary | ICD-10-CM | POA: Diagnosis not present

## 2017-11-19 DIAGNOSIS — E1165 Type 2 diabetes mellitus with hyperglycemia: Secondary | ICD-10-CM | POA: Diagnosis not present

## 2017-11-22 DIAGNOSIS — R110 Nausea: Secondary | ICD-10-CM | POA: Diagnosis not present

## 2017-11-22 DIAGNOSIS — E7800 Pure hypercholesterolemia, unspecified: Secondary | ICD-10-CM | POA: Diagnosis not present

## 2017-11-22 DIAGNOSIS — E1165 Type 2 diabetes mellitus with hyperglycemia: Secondary | ICD-10-CM | POA: Diagnosis not present

## 2017-11-22 DIAGNOSIS — M159 Polyosteoarthritis, unspecified: Secondary | ICD-10-CM | POA: Diagnosis not present

## 2017-11-22 DIAGNOSIS — E119 Type 2 diabetes mellitus without complications: Secondary | ICD-10-CM | POA: Diagnosis not present

## 2017-11-25 DIAGNOSIS — H1013 Acute atopic conjunctivitis, bilateral: Secondary | ICD-10-CM | POA: Diagnosis not present

## 2017-11-28 ENCOUNTER — Encounter: Payer: Self-pay | Admitting: Sports Medicine

## 2017-11-28 ENCOUNTER — Ambulatory Visit (INDEPENDENT_AMBULATORY_CARE_PROVIDER_SITE_OTHER): Payer: MEDICARE | Admitting: Sports Medicine

## 2017-11-28 VITALS — BP 122/72 | Ht 68.0 in | Wt 174.0 lb

## 2017-11-28 DIAGNOSIS — M25561 Pain in right knee: Secondary | ICD-10-CM | POA: Diagnosis not present

## 2017-11-28 DIAGNOSIS — G8929 Other chronic pain: Secondary | ICD-10-CM | POA: Diagnosis not present

## 2017-11-28 NOTE — Assessment & Plan Note (Signed)
She tends to develop large effusions and previously Baker's cyst. This cyst is not appreciated on exam today. There is a mild to moderate effusion. Her pain is largely improved since resuming exercises. The previous exacerbation was related to immobilization with C Diff. We will hold off on intraarticular injections at this time since it is improving conservatively. She is instructed to start compression a few times per day to help with effusion resorption. If the pain gets worse or much more swelling CSI remains an option.

## 2017-11-28 NOTE — Progress Notes (Signed)
   HPI  Beth Macdonald is here for evaluation of right knee pain after previous visit on 2/7. She has known arthritis which previously improved after cortisone injection last December for a Baker's cyst. Her exercise had been severely held back by C Diff infection that finished treatment on Monday but she has resumed straight leg raises daily for a week which has given a good improvement. The knee does not feel weak or unstable. She has noticed swelling above the knee but no return of the previous cyst.  CC: Right knee pain   Medications/Interventions Tried: Straight leg raise exercises  See HPI and/or previous note for associated ROS. ROS - now no night pain Generalized pain in day is usually 2/10  Objective: BP 122/72   Ht 5\' 8"  (1.727 m)   Wt 174 lb (78.9 kg)   BMI 26.46 kg/m  Gen: NAD, well groomed, normal affect.  CV: Well-perfused. Warm.  Resp: Non-labored.  Neuro: Sensation intact throughout. Gait: Nonpathologic posture, unremarkable stride without signs of limp or balance issues. MSK: The right knee shows mildly increase valgus deviation and medial bony hypertrophy compared to left ROM is from about 5-120 degrees on right and 0-135 degrees on left there is a small lateral effusion at the suprapatellar pouch, there is no erythema Patellofemoral crepitus is present bilaterally  Ultrasound Right Knee   evaluation demonstrates a mild to moderate sized effusion at the right knee.  Menisci are degenerative with some calcifications and bony hypertrophy is visible on medial and lateral sides.   Loss of space mid medial joint line. There is only trace fluid at the left knee SPP in comparison.  Assessment and plan:  Right knee pain She tends to develop large effusions and previously Baker's cyst. This cyst is not appreciated on exam today. There is a mild to moderate effusion. Her pain is largely improved since resuming exercises. The previous exacerbation was related to  immobilization with C Diff. We will hold off on intraarticular injections at this time since it is improving conservatively. She is instructed to start compression a few times per day to help with effusion resorption. If the pain gets worse or much more swelling CSI remains an option.  Beth Salina, MD PGY-III Internal Medicine Resident 11/28/2017, 5:28 PM  I observed and examined the patient with the resident and agree with assessment and plan.  Note reviewed and modified by me. Stefanie Libel, MD

## 2017-12-05 DIAGNOSIS — M159 Polyosteoarthritis, unspecified: Secondary | ICD-10-CM | POA: Diagnosis not present

## 2017-12-05 DIAGNOSIS — Z0001 Encounter for general adult medical examination with abnormal findings: Secondary | ICD-10-CM | POA: Diagnosis not present

## 2017-12-05 DIAGNOSIS — R740 Nonspecific elevation of levels of transaminase and lactic acid dehydrogenase [LDH]: Secondary | ICD-10-CM | POA: Diagnosis not present

## 2017-12-05 DIAGNOSIS — R197 Diarrhea, unspecified: Secondary | ICD-10-CM | POA: Diagnosis not present

## 2017-12-05 DIAGNOSIS — Z791 Long term (current) use of non-steroidal anti-inflammatories (NSAID): Secondary | ICD-10-CM | POA: Diagnosis not present

## 2017-12-05 DIAGNOSIS — E782 Mixed hyperlipidemia: Secondary | ICD-10-CM | POA: Diagnosis not present

## 2017-12-05 DIAGNOSIS — G629 Polyneuropathy, unspecified: Secondary | ICD-10-CM | POA: Diagnosis not present

## 2017-12-05 DIAGNOSIS — Z6828 Body mass index (BMI) 28.0-28.9, adult: Secondary | ICD-10-CM | POA: Diagnosis not present

## 2017-12-05 DIAGNOSIS — L719 Rosacea, unspecified: Secondary | ICD-10-CM | POA: Diagnosis not present

## 2017-12-05 DIAGNOSIS — E1165 Type 2 diabetes mellitus with hyperglycemia: Secondary | ICD-10-CM | POA: Diagnosis not present

## 2017-12-05 DIAGNOSIS — M545 Low back pain: Secondary | ICD-10-CM | POA: Diagnosis not present

## 2017-12-16 DIAGNOSIS — H1013 Acute atopic conjunctivitis, bilateral: Secondary | ICD-10-CM | POA: Diagnosis not present

## 2018-01-06 ENCOUNTER — Encounter: Payer: Self-pay | Admitting: Sports Medicine

## 2018-01-06 ENCOUNTER — Ambulatory Visit (INDEPENDENT_AMBULATORY_CARE_PROVIDER_SITE_OTHER): Payer: MEDICARE | Admitting: Sports Medicine

## 2018-01-06 DIAGNOSIS — M1711 Unilateral primary osteoarthritis, right knee: Secondary | ICD-10-CM

## 2018-01-06 DIAGNOSIS — G629 Polyneuropathy, unspecified: Secondary | ICD-10-CM | POA: Diagnosis not present

## 2018-01-06 MED ORDER — METHYLPREDNISOLONE ACETATE 40 MG/ML IJ SUSP: 40.0000 mg | Freq: Once | INTRAMUSCULAR | Status: DC

## 2018-01-06 NOTE — Progress Notes (Signed)
Date of Visit: 01/06/2018   HPI:  Beth Macdonald is a 72 y.o. female with a medical history significant for right-sided baker's cyst and bilateral knee OA who presents with bilateral knee pain. The pain in her right knee is more bothersome than that of her left. She has had knee pain for approximately 1 year and has been able to control her pain in the past with daily water exercise and leg raises. However, these methods are no longer providing her adequate relief and she has noticed limitations in her functional status as a result of her knee pain. She notes that in her right knee, the pain is worse anteriorly. It is triggered by squatting or going down stairs. Walking seems to result in moderate swelling of the tissues surrounding the knee. She describes the pain as persistently dull, but at times she will get sharp pain with sudden movements. She had an injection on 12/4 into the right-sided baker's cyst with minimal improvement that only lasted a few days. She has had no intraarticular injections. She denies noting any locking of the knee joint. She does occasionally feel like her knee may "give out," but denies any history of falls. She denies any symptoms associated with her bakers cyst, which has been well controlled with frequent rolling and massage of the area.  ROS: See HPI.   PHYSICAL EXAM: BP 128/68  Gen: well appearing, no acute distress CV: warm and well-perfused Resp: non-labored Neuro: sensation intact throughout MSK:   Right knee: Small lateral effusion present at the suprapatellar pouch. No overlying erythema. There is tenderness to palpation superior to the proximal patella. Mild medial and lateral joint line tenderness. Full ROM with flexion and extension. 5/5 strength with knee flexion/extension. Baker's cyst not palpable.   Left knee: No effusion or overlying erythema of knee. Moderate tenderness to palpation of the medial joint line. Full ROM with flexion/extension.  5/5 strength with hip abduction. No ligamentous laxity appreciated. Neurovascularly intact.   Bedside US performed: Significant suprapatellar pouch effusion present on right. Very mild effusion present on the left. Quad tendons intact.   Procedure: Intraarticular Injection of RT knee under ultrasound Consent obtained and verified. Time-out conducted. Noted no overlying erythema, induration, or other signs of local infection. Skin prepped in a sterile fashion. Utilizing superolateral approach, patient's right knee was injected intraarticularly with mixture of 1cc of depomedrol40mg /mL and4cc of 1%xyoclainewithout epinephrine. Completed without difficulty.  Appropriate placement of medication confirmed on ultrasound. Advised to call if fevers/chills, erythema, induration, drainage, or persistent bleeding   ASSESSMENT/PLAN:  Bilateral Knee OA: Beth Macdonald is a 72 y.o. female who presents with chronic bilateral knee pain, right worse than left. She has evidence of bilateral OA on prior plain films of the knees and a history of right-sided baker's cyst. History suggests significant pain related to her osteoarthritis despite conservative exercise management. US findings confirm the presence of effusion, particularly on the right. Discussed CSI with patient and performed intraarticular injection of the right knee. Will delay CSI of the left knee given that there was only mild effusion today. Will follow-up in 1 month to re-assess left knee and consider CSI at that time.    FOLLOW UP: Follow up in 1 month for re-assessment of bilateral knee OA and consideration of left knee intraarticular CSI.   Reinaldo Raddle, Medical Student   I observed and examined the patient with the resident and agree with assessment and plan.  Note reviewed and modified by me. All  key elements repeated by me.  Stefanie Libel, MD

## 2018-01-06 NOTE — Assessment & Plan Note (Signed)
Recurrent effusions suggest progression of OA  CSI today and we may need to repeat this q 4 to 6 mos  Strength is good  Compression sleeves for activity  Left knee is painful but less impressive on [place] Consider injection in future

## 2018-01-09 ENCOUNTER — Ambulatory Visit: Payer: MEDICARE | Admitting: Sports Medicine

## 2018-02-04 ENCOUNTER — Ambulatory Visit (INDEPENDENT_AMBULATORY_CARE_PROVIDER_SITE_OTHER): Payer: MEDICARE | Admitting: Sports Medicine

## 2018-02-04 ENCOUNTER — Encounter: Payer: Self-pay | Admitting: Sports Medicine

## 2018-02-04 DIAGNOSIS — M1711 Unilateral primary osteoarthritis, right knee: Secondary | ICD-10-CM | POA: Diagnosis not present

## 2018-02-04 NOTE — Progress Notes (Signed)
   Subjective:    Patient ID: Beth Macdonald, female    DOB: Aug 21, 1946, 72 y.o.   MRN: [id number]  HPIPt presents for re-eval of R knee and new eval of L knee for consideration of a steroid injxn. She reprts getting a cortisone injxn in her R knee here 6 weeks ago that has been very successful in alleviating her pain and is still working well. She says the severe pain has not returned. She is going to a wedding this Saturday where she will be expected to do some dancing and would like her L knee evaluated for a possible steroid injxn today.Her knees swell slightly after walking more than 10-15 minutes, R>L. She has known B knee arthritis and states she has not been the same since being sedentary during a 9 month shoulder surgery recovery. She returned to her usual exercise in January most of which is in the pool. She is not taking any pain meds. Pain is currently 1-2/10. She ices daily with her CryoCuff which helps a lot. She also used a rolling pin on her R Baker's cyst and feels like it has resolved.    Review of Systems As above No neck pain No radicular sxs to arms    Objective:   Physical Exam Seated on exam table in NAD  R knee: Minimally swollen superolaterally, skin NL color NTTP Chronic degenerative change in appearance NL ROM, no popping / clicking / crepitus 5/5 strength Stable NVI  L knee: No swelling, skin NL color NTTP NL ROM, no popping / clicking / crepitus 5/5 strength Stable NVI Less chronic changes noted than RT  BS US performed with limited views. Baker's cyst appears to have resolved. Moderate R knee effusion (less than previously seen). No effusion seen on L.     Assessment & Plan:  Pt was mostly hoping that a L knee steroid injxn would serve as a preventative measure for pain during increased activity / dancing this weekend at a wedding. No signs of active inflammation or edema to suggest any current benefit from an injxn. She can take OTC NSAIDs as  needed for pain and was advised to take a prophylactic dose prior to the wedding reception. Pt is to f/u this fall for repeat R knee steroid injxn (which needs to be at least 3 months from the time of the original injxn). Return sooner if worsening L knee pain or swelling.

## 2018-02-04 NOTE — Assessment & Plan Note (Signed)
Clinically much improved  Still with effusion but Baker's cyst resolved  Cont conservative care and HEP  Degenerative changes in left knee on [place] but no effusion Will follow this

## 2018-02-28 DIAGNOSIS — M24811 Other specific joint derangements of right shoulder, not elsewhere classified: Secondary | ICD-10-CM | POA: Diagnosis not present

## 2018-03-03 DIAGNOSIS — E119 Type 2 diabetes mellitus without complications: Secondary | ICD-10-CM | POA: Diagnosis not present

## 2018-03-03 DIAGNOSIS — M542 Cervicalgia: Secondary | ICD-10-CM | POA: Diagnosis not present

## 2018-03-11 DIAGNOSIS — M542 Cervicalgia: Secondary | ICD-10-CM | POA: Diagnosis not present

## 2018-03-13 DIAGNOSIS — M542 Cervicalgia: Secondary | ICD-10-CM | POA: Diagnosis not present

## 2018-03-17 DIAGNOSIS — H1013 Acute atopic conjunctivitis, bilateral: Secondary | ICD-10-CM | POA: Diagnosis not present

## 2018-03-17 DIAGNOSIS — M542 Cervicalgia: Secondary | ICD-10-CM | POA: Diagnosis not present

## 2018-03-19 DIAGNOSIS — M542 Cervicalgia: Secondary | ICD-10-CM | POA: Diagnosis not present

## 2018-03-28 DIAGNOSIS — M7501 Adhesive capsulitis of right shoulder: Secondary | ICD-10-CM | POA: Diagnosis not present

## 2018-04-08 DIAGNOSIS — M24811 Other specific joint derangements of right shoulder, not elsewhere classified: Secondary | ICD-10-CM | POA: Diagnosis not present

## 2018-04-09 DIAGNOSIS — M24811 Other specific joint derangements of right shoulder, not elsewhere classified: Secondary | ICD-10-CM | POA: Diagnosis not present

## 2018-04-15 DIAGNOSIS — M24811 Other specific joint derangements of right shoulder, not elsewhere classified: Secondary | ICD-10-CM | POA: Diagnosis not present

## 2018-04-18 DIAGNOSIS — M24811 Other specific joint derangements of right shoulder, not elsewhere classified: Secondary | ICD-10-CM | POA: Diagnosis not present

## 2018-04-21 DIAGNOSIS — M24811 Other specific joint derangements of right shoulder, not elsewhere classified: Secondary | ICD-10-CM | POA: Diagnosis not present

## 2018-04-23 DIAGNOSIS — M24811 Other specific joint derangements of right shoulder, not elsewhere classified: Secondary | ICD-10-CM | POA: Diagnosis not present

## 2018-04-24 ENCOUNTER — Ambulatory Visit: Payer: MEDICARE | Admitting: Sports Medicine

## 2018-04-28 DIAGNOSIS — M24811 Other specific joint derangements of right shoulder, not elsewhere classified: Secondary | ICD-10-CM | POA: Diagnosis not present

## 2018-05-12 DIAGNOSIS — M24811 Other specific joint derangements of right shoulder, not elsewhere classified: Secondary | ICD-10-CM | POA: Diagnosis not present

## 2018-05-14 DIAGNOSIS — R197 Diarrhea, unspecified: Secondary | ICD-10-CM | POA: Diagnosis not present

## 2018-05-14 DIAGNOSIS — E119 Type 2 diabetes mellitus without complications: Secondary | ICD-10-CM | POA: Diagnosis not present

## 2018-05-16 DIAGNOSIS — M24811 Other specific joint derangements of right shoulder, not elsewhere classified: Secondary | ICD-10-CM | POA: Diagnosis not present

## 2018-07-07 DIAGNOSIS — Z23 Encounter for immunization: Secondary | ICD-10-CM | POA: Diagnosis not present

## 2018-07-07 DIAGNOSIS — L304 Erythema intertrigo: Secondary | ICD-10-CM | POA: Diagnosis not present

## 2018-07-07 DIAGNOSIS — M25511 Pain in right shoulder: Secondary | ICD-10-CM | POA: Diagnosis not present

## 2018-07-14 DIAGNOSIS — L089 Local infection of the skin and subcutaneous tissue, unspecified: Secondary | ICD-10-CM | POA: Diagnosis not present

## 2018-07-14 DIAGNOSIS — M159 Polyosteoarthritis, unspecified: Secondary | ICD-10-CM | POA: Diagnosis not present

## 2018-07-17 ENCOUNTER — Ambulatory Visit (INDEPENDENT_AMBULATORY_CARE_PROVIDER_SITE_OTHER): Payer: MEDICARE | Admitting: Podiatry

## 2018-07-17 ENCOUNTER — Other Ambulatory Visit: Payer: Self-pay | Admitting: Podiatry

## 2018-07-17 VITALS — BP 166/77 | HR 67

## 2018-07-17 DIAGNOSIS — L02611 Cutaneous abscess of right foot: Secondary | ICD-10-CM

## 2018-07-17 DIAGNOSIS — L03031 Cellulitis of right toe: Secondary | ICD-10-CM | POA: Diagnosis not present

## 2018-07-17 MED ORDER — HYDROCODONE-ACETAMINOPHEN 5-325 MG PO TABS: 1.0000 | ORAL_TABLET | ORAL | 0 refills | Status: DC | PRN

## 2018-07-17 NOTE — Patient Instructions (Signed)

## 2018-07-19 NOTE — Progress Notes (Signed)
Subjective:   Patient ID: Beth Macdonald, female   DOB: 72 y.o.   MRN: [id number]   HPI 72 year old female presents the office with concerns of ingrown toenails for 1 month to the right big toe she states that there is a pus pocket that is formed on the toenail has not been able to get any drainage out of it and has not popped.  There is tender with pressure.  Is gotten worse over the last 4 days.  Unfortunately she could not take any antibiotics because they all cause C. difficile.  She has been soaking her foot in vinegar as well.  She has been using mupirocin ointment.  She has no other concerns today.  Review of Systems  All other systems reviewed and are negative.  No past medical history on file.  No past surgical history on file.   Current Outpatient Medications:  .  atorvastatin (LIPITOR) 20 MG tablet, Take 20 mg by mouth daily., Disp: , Rfl:  .  ibuprofen (ADVIL,MOTRIN) 800 MG tablet, 1T EVERY 8 HOURS AS NEEDED FOR PAIN, Disp: , Rfl: 1 .  metFORMIN (GLUCOPHAGE) 500 MG tablet, 1 TABLET WITH MEALS ONCE A DAY FOR 3-5 DAYS THEN ONE TWICE DAILY, Disp: , Rfl: 3 .  metroNIDAZOLE (FLAGYL) 500 MG tablet, TAKE 1 TABLET BY MOUTH THREE TIMES A DAY FOR 10 DAYS, Disp: , Rfl: 0 .  mupirocin ointment (BACTROBAN) 2 %, , Disp: , Rfl:  .  HYDROcodone-acetaminophen (NORCO/VICODIN) 5-325 MG tablet, Take 1 tablet by mouth every 4 (four) hours as needed., Disp: 6 tablet, Rfl: 0  Current Facility-Administered Medications:  .  methylPREDNISolone acetate (DEPO-MEDROL) injection 40 mg, 40 mg, Intra-articular, Once, Stefanie Libel, MD  No Known Allergies      Objective:  Physical Exam  General: AAO x3, NAD  Dermatological: There is incurvation present to the lateral aspect the right hallux toenail there is a evidence of pus along the nail border is going underneath the toenail the nail is looking for an underlying nail bed.  No ascending cellulitis there is edema to the toenail site.  Vascular:  Dorsalis Pedis artery and Posterior Tibial artery pedal pulses are 2/4 bilateral with immedate capillary fill time. There is no pain with calf compression, swelling, warmth, erythema.   Neruologic: Grossly intact via light touch bilateral.Protective threshold with Semmes Wienstein monofilament intact to all pedal sites bilateral.   Musculoskeletal: Mild tenderness to the ingrown toenail to the right hallux toenail muscular strength 5/5 in all groups tested bilateral.  Gait: Unassisted, Nonantalgic.       Assessment:   Right hallux toenail with localized infection, abscess     Plan:  -Treatment options discussed including all alternatives, risks, and complications -Etiology of symptoms were discussed -At this time I do recommend a total nail avulsion given the infection.  Recommend total nail avulsion with incision and drainage to relieve the abscess.  Alternatives, risks, complications were discussed.  No promises were given.  Procedure consent form was signed.  The toe was anesthetized with a mixture of 3 cc of lidocaine and Marcaine plain.  Once anesthetized the skin was prepped in sterile fashion.  The toenail was then loosened and quite a bit of purulence was expressed and this was cultured.  The nail was removed in total as it was loose and underlying nail bed on the lateral corner only attached on the medial aspect.  After I removed the toenail area was cleaned and there is no further purulence or  signs of infection noted to the nailbed and underlying skin was intact.  There is pain with alcohol.  The wound was applied followed by dry sterile dressing.  Following the procedure the tourniquet was released and there is found to be an immediate capillary refill time to the digits.  She tolerated well.  Her to continue the mupirocin ointment dressing changes daily.  Unfortunately hold off on oral antibiotics.  Monitoring signs or symptoms of worsening infection.  Trula Slade DPM

## 2018-07-20 LAB — WOUND CULTURE
MICRO NUMBER:: 91373742
SPECIMEN QUALITY:: ADEQUATE

## 2018-07-21 ENCOUNTER — Ambulatory Visit: Payer: MEDICARE | Admitting: Podiatry

## 2018-07-21 ENCOUNTER — Telehealth: Payer: Self-pay | Admitting: *Deleted

## 2018-07-21 NOTE — Telephone Encounter (Signed)
I informed pt of Dr. Leigh Aurora review of results and asked how the toe was doing. Pt states not too good the toe is very red with white splotches, but she will discuss with Dr. Jacqualyn Posey at her appt tomorrow afternoon.

## 2018-07-21 NOTE — Telephone Encounter (Signed)
I will see her tomorrow then we can talk about it if needed

## 2018-07-21 NOTE — Telephone Encounter (Signed)
-----   Message from Trula Slade, DPM sent at 07/21/2018  8:09 AM EST ----- Val- please let her know that the culture did show a strep infection. Can you see how she is doing? We are doing topical antibiotic ointment as she cannot take oral antibiotics and hopefully the removal of the toenail and draining of the superficial abscess will help resolve the infection. Thanks!

## 2018-07-21 NOTE — Telephone Encounter (Signed)
Can you please make sure there is no red streaks, pus. If so can you ask her if there are any oral antibiotics she can take?

## 2018-07-21 NOTE — Telephone Encounter (Signed)
I spoke with pt again, she states the toenail bed is red, and 1/4 inch from the base is red and swollen, no red streaks, but there are white firm streaks in the nail bed. Pt states she had an antibiotic for an eye infection in March and developed C-diff, then an antibiotic in August and within 6 weeds had C-diff again, states if she had to take an antibiotic she guess she would.

## 2018-07-22 ENCOUNTER — Encounter: Payer: Self-pay | Admitting: Podiatry

## 2018-07-22 ENCOUNTER — Telehealth: Payer: Self-pay | Admitting: Podiatry

## 2018-07-22 ENCOUNTER — Ambulatory Visit (INDEPENDENT_AMBULATORY_CARE_PROVIDER_SITE_OTHER): Payer: MEDICARE | Admitting: Podiatry

## 2018-07-22 VITALS — Temp 98.1°F

## 2018-07-22 DIAGNOSIS — L03031 Cellulitis of right toe: Secondary | ICD-10-CM

## 2018-07-22 MED ORDER — CEPHALEXIN 500 MG PO CAPS: 500.0000 mg | ORAL_CAPSULE | Freq: Three times a day (TID) | ORAL | 0 refills | Status: DC

## 2018-07-22 NOTE — Telephone Encounter (Signed)
Can you please let the patient know and I sent it over to the pharmacy.

## 2018-07-22 NOTE — Telephone Encounter (Signed)
Pt request that Dr. Jacqualyn Posey would need to call her primary care physician Dr. Shelia Media at [facility] before prescribing any anti-biotic medication @ [phone]

## 2018-07-22 NOTE — Telephone Encounter (Signed)
I told [facility] to inform Dr. Shelia Media, Dr. Jacqualyn Posey had ordered Keflex for pt. Beth Macdonald states she will have Gay Filler give me a call.

## 2018-07-22 NOTE — Telephone Encounter (Addendum)
[facility] took message to Dr. Shelia Media requesting the antibiotic pt has taken that she did not have C-Diff from.

## 2018-07-22 NOTE — Telephone Encounter (Signed)
I informed pt Beth Macdonald, pharmacist - [facility] had said it would be fine for her totake the Keflex Dr. Paulla Dolly had orders. Pt states understanding.

## 2018-07-22 NOTE — Telephone Encounter (Signed)
Val- can you please call his office to see if there has been an antibiotic she has been on before that she can tolerate?

## 2018-07-22 NOTE — Telephone Encounter (Signed)
Beth Macdonald, [name] states he feels the keflex will be fine, pt had previously called and stated she had received amoxicillin from another doctor.

## 2018-07-24 ENCOUNTER — Ambulatory Visit (INDEPENDENT_AMBULATORY_CARE_PROVIDER_SITE_OTHER): Payer: MEDICARE | Admitting: Podiatry

## 2018-07-24 DIAGNOSIS — L03031 Cellulitis of right toe: Secondary | ICD-10-CM

## 2018-07-24 DIAGNOSIS — L02611 Cutaneous abscess of right foot: Secondary | ICD-10-CM

## 2018-07-24 NOTE — Progress Notes (Signed)
Subjective: 72 year old female presents the office today for concerns and follow-up evaluation after having a right hallux toenail removed.  She states that she is got some redness in the area.  She is been soaking in Epsom salts without burn some.  Is been given antibiotic ointment and a bandage on the area daily.  Denies any red streaks.  She has not seen any pus. Denies any systemic complaints such as fevers, chills, nausea, vomiting. No acute changes since last appointment, and no other complaints at this time.   Objective: AAO x3, NAD DP/PT pulses palpable bilaterally, CRT less than 3 seconds Status post right hallux total nail avulsion.  Wound bed is granular.  There is some localized edema erythema on the proximal nail border but there is no fluctuation or crepitation but there is mild edema along the proximal nail border as well.  No ascending cellulitis.  Mild tenderness palpation of the toe. No open lesions or pre-ulcerative lesions.  No pain with calf compression, swelling, warmth, erythema  Assessment: 72 year old female right hallux erythema status post total nail avulsion  Plan: -All treatment options discussed with the patient including all alternatives, risks, complications.  -Given localized edema erythema to the proximal nail border it is closed over.  To evaluate the area make sure there is no further purulence or abscess we elected proceed with anesthetizing the toe and evaluation.  The toe was anesthetized with 3 cc of lidocaine, Marcaine mixture.  Once anesthetized the skin was prepped with Betadine.  I was able to debride the nailbed and the proximal nail border there was no further purulence or signs of an abscess present.  There is pain with alcohol.  Betadine ointment was applied followed by dry sterile dressing. -We will do Keflex.  Spoke with her primary care physician regards to this. -Patient encouraged to call the office with any questions, concerns, change in  symptoms.   Trula Slade DPM

## 2018-07-28 NOTE — Progress Notes (Signed)
Subjective: 72 year old female presents the office today for concerns and follow-up evaluation after having a right hallux toenail removed.  I did see her back earlier this week and I did anesthetize the area and no further drainage or purulence came out.  She is on Keflex for any issues.  She states the redness has gotten somewhat better she denies any drainage or pus or any red streaks.  Still some minimal discomfort but overall is doing better.  She still soaking Epsom salts given cover with antibiotic ointment and a bandage.  She has no other concerns today. Denies any systemic complaints such as fevers, chills, nausea, vomiting. No acute changes since last appointment, and no other complaints at this time.   Objective: AAO x3, NAD DP/PT pulses palpable bilaterally, CRT less than 3 seconds Status post right hallux total nail avulsion.  Wound bed is granular starting to scab over.  There is decrease edema erythema along the proximal nail border.  There is no drainage or pus there is no fluctuation or crepitation.  There is no ascending sialitis.  There is no malodor. No open lesions or pre-ulcerative lesions.  No pain with calf compression, swelling, warmth, erythema  Assessment: 72 year old female right hallux erythema status post total nail avulsion; improving  Plan: -All treatment options discussed with the patient including all alternatives, risks, complications.  -Overall is gotten better.  Continue with Epson salt soaks daily followed by antibiotic ointment and a bandage during the day but keep the area dry at night.  Underscore some antibiotics.  Monitoring signs or symptoms of worsening infection of the ER should any occur call the office.  I will see her back in about 2 weeks or sooner if any issues are to arise.  Trula Slade DPM

## 2018-08-04 ENCOUNTER — Ambulatory Visit (INDEPENDENT_AMBULATORY_CARE_PROVIDER_SITE_OTHER): Payer: Self-pay

## 2018-08-04 DIAGNOSIS — L03031 Cellulitis of right toe: Secondary | ICD-10-CM

## 2018-08-04 NOTE — Patient Instructions (Signed)

## 2018-08-08 NOTE — Progress Notes (Signed)
Patient is here today for follow-up appointment, recent procedure performed 07/24/2018, removal of ingrown right hallux nail.  She states that the area is not hurting, and overall she feels like it is doing well.  No redness, no erythema, no swelling, no drainage, no other signs and symptoms of infection.  The area is scabbed over at this time it appears to be healing well.  Patient finished her course of antibiotics.  We discussed signs and symptoms of infection.  Verbal and written instructions were given to the patient.  She is to follow-up with any acute symptom changes.

## 2018-09-03 DIAGNOSIS — G459 Transient cerebral ischemic attack, unspecified: Secondary | ICD-10-CM

## 2018-09-03 HISTORY — DX: Transient cerebral ischemic attack, unspecified: G45.9

## 2018-09-08 DIAGNOSIS — E119 Type 2 diabetes mellitus without complications: Secondary | ICD-10-CM | POA: Diagnosis not present

## 2018-10-29 ENCOUNTER — Observation Stay (HOSPITAL_COMMUNITY)
Admission: EM | Admit: 2018-10-29 | Discharge: 2018-10-30 | Disposition: A | Discharge status: Home / Self Care | Payer: MEDICARE | Attending: Internal Medicine | Admitting: Internal Medicine

## 2018-10-29 ENCOUNTER — Encounter (HOSPITAL_COMMUNITY): Payer: Self-pay | Admitting: Emergency Medicine

## 2018-10-29 ENCOUNTER — Observation Stay (HOSPITAL_COMMUNITY): Payer: MEDICARE

## 2018-10-29 ENCOUNTER — Other Ambulatory Visit: Payer: Self-pay

## 2018-10-29 ENCOUNTER — Emergency Department (HOSPITAL_COMMUNITY): Payer: MEDICARE

## 2018-10-29 DIAGNOSIS — E1169 Type 2 diabetes mellitus with other specified complication: Secondary | ICD-10-CM

## 2018-10-29 DIAGNOSIS — R4182 Altered mental status, unspecified: Secondary | ICD-10-CM | POA: Diagnosis present

## 2018-10-29 DIAGNOSIS — Z87891 Personal history of nicotine dependence: Secondary | ICD-10-CM | POA: Insufficient documentation

## 2018-10-29 DIAGNOSIS — I10 Essential (primary) hypertension: Secondary | ICD-10-CM | POA: Insufficient documentation

## 2018-10-29 DIAGNOSIS — I674 Hypertensive encephalopathy: Secondary | ICD-10-CM | POA: Diagnosis not present

## 2018-10-29 DIAGNOSIS — Z7984 Long term (current) use of oral hypoglycemic drugs: Secondary | ICD-10-CM | POA: Insufficient documentation

## 2018-10-29 DIAGNOSIS — E785 Hyperlipidemia, unspecified: Secondary | ICD-10-CM

## 2018-10-29 DIAGNOSIS — E119 Type 2 diabetes mellitus without complications: Secondary | ICD-10-CM | POA: Insufficient documentation

## 2018-10-29 DIAGNOSIS — R410 Disorientation, unspecified: Secondary | ICD-10-CM | POA: Diagnosis not present

## 2018-10-29 DIAGNOSIS — R4781 Slurred speech: Secondary | ICD-10-CM | POA: Diagnosis not present

## 2018-10-29 DIAGNOSIS — G459 Transient cerebral ischemic attack, unspecified: Secondary | ICD-10-CM | POA: Diagnosis not present

## 2018-10-29 DIAGNOSIS — R42 Dizziness and giddiness: Secondary | ICD-10-CM | POA: Diagnosis not present

## 2018-10-29 HISTORY — DX: Unspecified fracture of skull, initial encounter for closed fracture: S02.91XA

## 2018-10-29 HISTORY — DX: Type 2 diabetes mellitus without complications: E11.9

## 2018-10-29 HISTORY — DX: Bell's palsy: G51.0

## 2018-10-29 HISTORY — DX: Hyperlipidemia, unspecified: E78.5

## 2018-10-29 HISTORY — DX: Cervicalgia: M54.2

## 2018-10-29 LAB — COMPREHENSIVE METABOLIC PANEL
ALT: 21 U/L (ref 0–44)
AST: 21 U/L (ref 15–41)
Albumin: 4.2 g/dL (ref 3.5–5.0)
Alkaline Phosphatase: 42 U/L (ref 38–126)
Anion gap: 13 (ref 5–15)
BUN: 24 mg/dL — ABNORMAL HIGH (ref 8–23)
CO2: 24 mmol/L (ref 22–32)
Calcium: 9.7 mg/dL (ref 8.9–10.3)
Chloride: 104 mmol/L (ref 98–111)
Creatinine, Ser: 1.17 mg/dL — ABNORMAL HIGH (ref 0.44–1.00)
GFR calc Af Amer: 53 mL/min — ABNORMAL LOW (ref >60)
GFR calc non Af Amer: 46 mL/min — ABNORMAL LOW (ref >60)
Glucose, Bld: 133 mg/dL — ABNORMAL HIGH (ref 70–99)
Potassium: 3.9 mmol/L (ref 3.5–5.1)
Sodium: 141 mmol/L (ref 135–145)
Total Bilirubin: 0.5 mg/dL (ref 0.3–1.2)
Total Protein: 7.1 g/dL (ref 6.5–8.1)

## 2018-10-29 LAB — DIFFERENTIAL
Abs Immature Granulocytes: 0.01 10*3/uL (ref 0.00–0.07)
Basophils Absolute: 0.1 10*3/uL (ref 0.0–0.1)
Basophils Relative: 1 %
Eosinophils Absolute: 0.4 10*3/uL (ref 0.0–0.5)
Eosinophils Relative: 5 %
Immature Granulocytes: 0 %
Lymphocytes Relative: 30 %
Lymphs Abs: 2.8 10*3/uL (ref 0.7–4.0)
Monocytes Absolute: 0.8 10*3/uL (ref 0.1–1.0)
Monocytes Relative: 8 %
Neutro Abs: 5.3 10*3/uL (ref 1.7–7.7)
Neutrophils Relative %: 56 %

## 2018-10-29 LAB — CBC
HCT: 42.2 % (ref 36.0–46.0)
Hemoglobin: 13.1 g/dL (ref 12.0–15.0)
MCH: 30 pg (ref 26.0–34.0)
MCHC: 31 g/dL (ref 30.0–36.0)
MCV: 96.8 fL (ref 80.0–100.0)
Platelets: 205 10*3/uL (ref 150–400)
RBC: 4.36 MIL/uL (ref 3.87–5.11)
RDW: 12.4 % (ref 11.5–15.5)
WBC: 9.4 10*3/uL (ref 4.0–10.5)
nRBC: 0 % (ref 0.0–0.2)

## 2018-10-29 LAB — CBG MONITORING, ED: Glucose-Capillary: 135 mg/dL — ABNORMAL HIGH (ref 70–99)

## 2018-10-29 LAB — GLUCOSE, CAPILLARY: Glucose-Capillary: 114 mg/dL — ABNORMAL HIGH (ref 70–99)

## 2018-10-29 LAB — PROTIME-INR
INR: 0.9 (ref 0.8–1.2)
Prothrombin Time: 12.3 seconds (ref 11.4–15.2)

## 2018-10-29 LAB — APTT: aPTT: 26 seconds (ref 24–36)

## 2018-10-29 LAB — I-STAT TROPONIN, ED: Troponin i, poc: 0 ng/mL (ref 0.00–0.08)

## 2018-10-29 LAB — I-STAT CREATININE, ED: Creatinine, Ser: 1.2 mg/dL — ABNORMAL HIGH (ref 0.44–1.00)

## 2018-10-29 MED ORDER — LORAZEPAM 2 MG/ML IJ SOLN: 1.0000 mg | Freq: Once | INTRAMUSCULAR | Status: DC

## 2018-10-29 MED ORDER — STROKE: EARLY STAGES OF RECOVERY BOOK
Freq: Once | Status: AC
  Administered 2018-10-29: 21:00:00
  Filled 2018-10-29: qty 1

## 2018-10-29 MED ORDER — ENOXAPARIN SODIUM 40 MG/0.4ML ~~LOC~~ SOLN
40.0000 mg | SUBCUTANEOUS | Status: DC
  Administered 2018-10-29: 40 mg via SUBCUTANEOUS
  Filled 2018-10-29: qty 0.4

## 2018-10-29 MED ORDER — ACETAMINOPHEN 650 MG RE SUPP: 650.0000 mg | Freq: Four times a day (QID) | RECTAL | Status: DC | PRN

## 2018-10-29 MED ORDER — ONDANSETRON HCL 4 MG PO TABS: 4.0000 mg | ORAL_TABLET | Freq: Four times a day (QID) | ORAL | Status: DC | PRN

## 2018-10-29 MED ORDER — ASPIRIN EC 81 MG PO TBEC
81.0000 mg | DELAYED_RELEASE_TABLET | Freq: Every day | ORAL | Status: DC
  Administered 2018-10-30: 81 mg via ORAL
  Filled 2018-10-29: qty 1

## 2018-10-29 MED ORDER — IBUPROFEN 200 MG PO TABS: 800.0000 mg | ORAL_TABLET | Freq: Every day | ORAL | Status: DC

## 2018-10-29 MED ORDER — INSULIN ASPART 100 UNIT/ML ~~LOC~~ SOLN
0.0000 [IU] | Freq: Three times a day (TID) | SUBCUTANEOUS | Status: DC
  Administered 2018-10-30: 2 [IU] via SUBCUTANEOUS

## 2018-10-29 MED ORDER — HYDRALAZINE HCL 20 MG/ML IJ SOLN: 10.0000 mg | INTRAMUSCULAR | Status: DC | PRN

## 2018-10-29 MED ORDER — SODIUM CHLORIDE 0.9% FLUSH
3.0000 mL | Freq: Once | INTRAVENOUS | Status: DC
Start: 2018-10-29 — End: 2018-10-30

## 2018-10-29 MED ORDER — ONDANSETRON HCL 4 MG/2ML IJ SOLN: 4.0000 mg | Freq: Four times a day (QID) | INTRAMUSCULAR | Status: DC | PRN

## 2018-10-29 MED ORDER — ACETAMINOPHEN 325 MG PO TABS: 650.0000 mg | ORAL_TABLET | Freq: Four times a day (QID) | ORAL | Status: DC | PRN

## 2018-10-29 MED ORDER — ATORVASTATIN CALCIUM 10 MG PO TABS
20.0000 mg | ORAL_TABLET | Freq: Every day | ORAL | Status: DC
  Administered 2018-10-30: 20 mg via ORAL
  Filled 2018-10-29: qty 2

## 2018-10-29 NOTE — ED Triage Notes (Signed)
Patient arrives POV with last known normal of 13:50. Patient reports a sudden onset of confusion and had difficulty finding her words for approximately 20 minutes. Patient reports taking aspirin PTA. Patient states she still feels confused at this time, however answering questions appropriately and following commands. Patient has hx of traumatic nerve damage causing facial paralysis. Patient states speech is back to norm.

## 2018-10-29 NOTE — ED Notes (Signed)
ED TO INPATIENT HANDOFF REPORT  ED Nurse Name and Phone #: Joellen Jersey [phone]  S Name/Age/Gender Beth Macdonald 73 y.o. female Room/Bed: 031C/031C  Code Status   Code Status: Not on file  Home/SNF/Other Home Patient oriented to: self, place, time and situation Is this baseline? Yes   Triage Complete: Triage complete  Chief Complaint Stroke symptoms  Triage Note Patient arrives POV with last known normal of 13:50. Patient reports a sudden onset of confusion and had difficulty finding her words for approximately 20 minutes. Patient reports taking aspirin PTA. Patient states she still feels confused at this time, however answering questions appropriately and following commands. Patient has hx of traumatic nerve damage causing facial paralysis. Patient states speech is back to norm.    Allergies Allergies  Allergen Reactions  . Tetracyclines & Related Other (See Comments)    "Feel bad"    Level of Care/Admitting Diagnosis ED Disposition    ED Disposition Condition [place]: [place] [100100]  Level of Care: Medical Telemetry [104]  I expect the patient will be discharged within 24 hours: Yes  LOW acuity---Tx typically complete <24 hrs---ACUTE conditions typically can be evaluated <24 hours---LABS likely to return to acceptable levels <24 hours---IS near functional baseline---EXPECTED to return to current living arrangement---NOT newly hypoxic: Meets criteria for 5C-Observation unit  Diagnosis: TIA (transient ischemic attack) [195093]  Admitting Physician: Tawni Millers [[id number]]  Attending Physician: Tawni Millers [[id number]]  PT Class (Do Not Modify): Observation [104]  PT Acc Code (Do Not Modify): Observation [10022]       B Medical/Surgery History Past Medical History:  Diagnosis Date  . Diabetes mellitus without complication (HCC)    Type 2  . Facial paralysis/Bells palsy   . Hyperlipidemia   . Neck  pain   . Skull fracture [facility])    Past Surgical History:  Procedure Laterality Date  . shoulder Bilateral      A IV Location/Drains/Wounds Patient Lines/Drains/Airways Status   Active Line/Drains/Airways    Name:   Placement date:   Placement time:   Site:   Days:   Peripheral IV 10/29/18 Left Antecubital   10/29/18    1610    Antecubital   less than 1          Intake/Output Last 24 hours No intake or output data in the 24 hours ending 10/29/18 1744  Labs/Imaging Results for orders placed or performed during the hospital encounter of 10/29/18 (from the past 48 hour(s))  Protime-INR     Status: None   Collection Time: 10/29/18  4:12 PM  Result Value Ref Range   Prothrombin Time 12.3 11.4 - 15.2 seconds   INR 0.9 0.8 - 1.2    Comment: (NOTE) INR goal varies based on device and disease states. Performed at [facility], [place] [address], [place], [place] [address]   APTT     Status: None   Collection Time: 10/29/18  4:12 PM  Result Value Ref Range   aPTT 26 24 - 36 seconds    Comment: Performed at [facility] [address] [place] [address]  CBC     Status: None   Collection Time: 10/29/18  4:12 PM  Result Value Ref Range   WBC 9.4 4.0 - 10.5 K/uL   RBC 4.36 3.87 - 5.11 MIL/uL   Hemoglobin 13.1 12.0 - 15.0 g/dL   HCT 42.2 36.0 - 46.0 %   MCV  96.8 80.0 - 100.0 fL   MCH 30.0 26.0 - 34.0 pg   MCHC 31.0 30.0 - 36.0 g/dL   RDW 12.4 11.5 - 15.5 %   Platelets 205 150 - 400 K/uL   nRBC 0.0 0.0 - 0.2 %    Comment: Performed at [facility] [address], [place], [place] [address]  Differential     Status: None   Collection Time: 10/29/18  4:12 PM  Result Value Ref Range   Neutrophils Relative % 56 %   Neutro Abs 5.3 1.7 - 7.7 K/uL   Lymphocytes Relative 30 %   Lymphs Abs 2.8 0.7 - 4.0 K/uL   Monocytes Relative 8 %   Monocytes Absolute 0.8 0.1 - 1.0 K/uL   Eosinophils Relative 5 %   Eosinophils Absolute 0.4 0.0 - 0.5 K/uL   Basophils  Relative 1 %   Basophils Absolute 0.1 0.0 - 0.1 K/uL   Immature Granulocytes 0 %   Abs Immature Granulocytes 0.01 0.00 - 0.07 K/uL    Comment: Performed at [facility] [address] [place], [place] [address]  Comprehensive metabolic panel     Status: Abnormal   Collection Time: 10/29/18  4:12 PM  Result Value Ref Range   Sodium 141 135 - 145 mmol/L   Potassium 3.9 3.5 - 5.1 mmol/L   Chloride 104 98 - 111 mmol/L   CO2 24 22 - 32 mmol/L   Glucose, Bld 133 (H) 70 - 99 mg/dL   BUN 24 (H) 8 - 23 mg/dL   Creatinine, Ser 1.17 (H) 0.44 - 1.00 mg/dL   Calcium 9.7 8.9 - 10.3 mg/dL   Total Protein 7.1 6.5 - 8.1 g/dL   Albumin 4.2 3.5 - 5.0 g/dL   AST 21 15 - 41 U/L   ALT 21 0 - 44 U/L   Alkaline Phosphatase 42 38 - 126 U/L   Total Bilirubin 0.5 0.3 - 1.2 mg/dL   GFR calc non Af Amer 46 (L) >60 mL/min   GFR calc Af Amer 53 (L) >60 mL/min   Anion gap 13 5 - 15    Comment: Performed at [facility] [address], [place], [place] [address]  I-stat troponin, ED     Status: None   Collection Time: 10/29/18  4:13 PM  Result Value Ref Range   Troponin i, poc 0.00 0.00 - 0.08 ng/mL   Comment 3            Comment: Due to the release kinetics of cTnI, a negative result within the first hours of the onset of symptoms does not rule out myocardial infarction with certainty. If myocardial infarction is still suspected, repeat the test at appropriate intervals.   I-stat Creatinine, ED     Status: Abnormal   Collection Time: 10/29/18  4:15 PM  Result Value Ref Range   Creatinine, Ser 1.20 (H) 0.44 - 1.00 mg/dL  CBG monitoring, ED     Status: Abnormal   Collection Time: 10/29/18  4:21 PM  Result Value Ref Range   Glucose-Capillary 135 (H) 70 - 99 mg/dL   Ct Head Code Stroke Wo Contrast  Result Date: 10/29/2018 CLINICAL DATA:  Code stroke. Confusion and brief episode of difficulty speaking. Pre-existing facial droop. EXAM: CT HEAD WITHOUT CONTRAST TECHNIQUE: Contiguous axial  images were obtained from the base of the skull through the vertex without intravenous contrast. COMPARISON:  None. FINDINGS: Brain: There is no evidence of acute infarct, intracranial  hemorrhage, mass, midline shift, or extra-axial fluid collection. The ventricles and sulci are normal. There is a partially empty sella. Mild cerebral atrophy is within normal limits for age. No significant white matter disease is evident for age. Vascular: Calcified atherosclerosis at the skull base. No hyperdense vessel. Skull: No fracture or focal osseous lesion. Sinuses/Orbits: Small right maxillary sinus mucous retention cyst. Clear mastoid air cells. Bilateral cataract extraction. Other: None. ASPECTS [facility] Stroke Program Early CT Score) - Ganglionic level infarction (caudate, lentiform nuclei, internal capsule, insula, M1-M3 cortex): 7 - Supraganglionic infarction (M4-M6 cortex): 3 Total score (0-10 with 10 being normal): 10 IMPRESSION: 1. No evidence of acute intracranial abnormality. 2. ASPECTS is 10. These results were called by telephone at the time of interpretation on 10/29/2018 at 4:11 pm to Dr. Fredia Sorrow , who verbally acknowledged these results. Electronically Signed   By: Logan Bores M.D.   On: 10/29/2018 16:12    Pending Labs [employer] (From admission, onward)    Start     Ordered   Signed and Held  Hemoglobin A1c  Tomorrow morning,   R     Signed and Held   Signed and Held  Lipid panel  Tomorrow morning,   R    Comments:  Fasting    Signed and Held   Signed and Held  CBC  (enoxaparin (LOVENOX)    CrCl >/= 30 ml/min)  Once,   R    Comments:  Baseline for enoxaparin therapy IF NOT ALREADY DRAWN.  Notify MD if PLT < 100 K.    Signed and Held   Signed and Held  Creatinine, serum  (enoxaparin (LOVENOX)    CrCl >/= 30 ml/min)  Once,   R    Comments:  Baseline for enoxaparin therapy IF NOT ALREADY DRAWN.    Signed and Held   Signed and Held  Creatinine, serum  (enoxaparin (LOVENOX)     CrCl >/= 30 ml/min)  Weekly,   R    Comments:  while on enoxaparin therapy    Signed and Held          Vitals/Pain Today's Vitals   10/29/18 1603 10/29/18 1617 10/29/18 1627 10/29/18 1700  BP:  (!) 197/91  (!) 159/71  Pulse:  80  73  Resp:  16  12  Temp:   97.7 F (36.5 C)   TempSrc:   Oral   SpO2:  100%  100%  Weight: 81.6 kg     Height: 5\' 8"  (1.727 m)     PainSc:        Isolation Precautions No active isolations  Medications Medications  sodium chloride flush (NS) 0.9 % injection 3 mL (3 mLs Intravenous Not Given 10/29/18 1651)  LORazepam (ATIVAN) injection 1 mg (0 mg Intravenous Hold 10/29/18 1731)    Mobility walks Low fall risk   Focused Assessments Neuro Assessment Handoff:  Swallow screen pass? Yes  Cardiac Rhythm: Normal sinus rhythm NIH Stroke Scale ( + Modified Stroke Scale Criteria)  Interval: Initial Level of Consciousness (1a.)   : Alert, keenly responsive LOC Questions (1b. )   +: Answers both questions correctly LOC Commands (1c. )   + : Performs both tasks correctly Best Gaze (2. )  +: Normal Visual (3. )  +: No visual loss Facial Palsy (4. )    : Partial paralysis (Pt has baseline of chronic bell's palsy) Motor Arm, Left (5a. )   +: No drift Motor Arm, Right (5b. )   +:  No drift Motor Leg, Left (6a. )   +: No drift Motor Leg, Right (6b. )   +: No drift Limb Ataxia (7. ): Absent Sensory (8. )   +: Normal, no sensory loss Best Language (9. )   +: No aphasia Dysarthria (10. ): Normal Extinction/Inattention (11.)   +: No Abnormality Modified SS Total  +: 0 Complete NIHSS TOTAL: 2 Last date known well: 10/29/18 Last time known well: 1520 Neuro Assessment: Exceptions to WDL Neuro Checks:   Initial (10/29/18 1615)  Last Documented NIHSS Modified Score: 0 (10/29/18 1615) Has TPA been given? No If patient is a Neuro Trauma and patient is going to OR before floor call report to [place] nurse: [phone] or  [phone]     R Recommendations: See Admitting Provider Note  Report given to:   Additional Notes:

## 2018-10-29 NOTE — Consult Note (Addendum)
Neurology Consultation  Reason for Consult: Transient difficulty expressing herself and slurred speech Referring Physician: Rogene Houston  History is obtained from: Patient  HPI: Beth Macdonald is a 73 y.o. female with history of skull fracture, right sided Bell's palsy/facial paralysis and diabetes.    Patient states that at approximately 1520 she had just eaten lunch and sat down with her wife and was having expressive difficulties.  Due to these expressive difficulties her wife drove her to the hospital.  Upon arrival at the hospital it was noted that she was hypertensive with blood pressure of 174/67 and 197/91.  Blood glucose is 137.  Patient at this time felt as though her symptoms had resolved.  The only symptom she did stated that she felt a little dizzy.  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  Patient does admit to having dizzy spells once in a while but she has never had difficulty with her speech before.  Patient also admits that she has never had a blood pressure this high that she knows of.   ED course: CT head showed no evidence of acute intracranial abnormalities  LKW: 1520 on 10/29/2018 tpa given?: no, resolved symptoms Premorbid modified Rankin scale (mRS): 0 NIH stroke score: 2 due to residual facial palsy secondary to Bell's palsy-delta NIH 0  ROS: ROS was performed and is negative except as noted in the HPI.   Past Medical History:  Diagnosis Date  . Diabetes mellitus without complication (HCC)    Type 2  . Facial paralysis/Bells palsy   . Hyperlipidemia   . Neck pain   . Skull fracture (HCC)     Family History  Problem Relation Age of Onset  . Hypertension Mother   . Hypertension Father    Social History:   reports that she quit smoking about 37 years ago. She has never used smokeless tobacco. She reports current alcohol use. No history on file for  drug.  Medications  Current Facility-Administered Medications:  .  methylPREDNISolone acetate (DEPO-MEDROL) injection 40 mg, 40 mg, Intra-articular, Once, Stefanie Libel, MD .  sodium chloride flush (NS) 0.9 % injection 3 mL, 3 mL, Intravenous, Once, Fredia Sorrow, MD  Current Outpatient Medications:  .  atorvastatin (LIPITOR) 20 MG tablet, Take 20 mg by mouth daily., Disp: , Rfl:  .  cephALEXin (KEFLEX) 500 MG capsule, Take 1 capsule (500 mg total) by mouth 3 (three) times daily., Disp: 21 capsule, Rfl: 0 .  HYDROcodone-acetaminophen (NORCO/VICODIN) 5-325 MG tablet, Take 1 tablet by mouth every 4 (four) hours as needed., Disp: 6 tablet, Rfl: 0 .  ibuprofen (ADVIL,MOTRIN) 800 MG tablet, 1T EVERY 8 HOURS AS NEEDED FOR PAIN, Disp: , Rfl: 1 .  metFORMIN (GLUCOPHAGE) 500 MG tablet, 1 TABLET WITH MEALS ONCE A DAY FOR 3-5 DAYS THEN ONE TWICE DAILY, Disp: , Rfl: 3 .  metroNIDAZOLE (FLAGYL) 500 MG tablet, TAKE 1 TABLET BY MOUTH THREE TIMES A DAY FOR 10 DAYS, Disp: , Rfl: 0 .  mupirocin ointment (BACTROBAN) 2 %, , Disp: , Rfl:    Exam: Current vital signs: BP (!) 197/91 (BP Location: Right Arm)   Pulse 80   Temp 97.7 F (36.5 C) (Oral)   Resp 16   Ht 5\' 8"  (1.727 m)   Wt 81.6 kg   SpO2 100%   BMI 27.37 kg/m  Vital signs in last 24 hours: Temp:  [97.7 F (36.5 C)] 97.7  F (36.5 C) (02/26 1627) Pulse Rate:  [80] 80 (02/26 1617) Resp:  [16] 16 (02/26 1617) BP: (174-197)/(67-91) 197/91 (02/26 1617) SpO2:  [100 %] 100 % (02/26 1617) Weight:  [81.6 kg] 81.6 kg (02/26 1603)  Physical Exam  Constitutional: Appears well-developed and well-nourished.  Psych: Affect appropriate to situation Eyes: No scleral injection HENT: No OP obstrucion Head: Normocephalic.  Cardiovascular: Normal rate and regular rhythm.  Respiratory: Effort normal, non-labored breathing GI: Soft.  No distension. There is no tenderness.  Skin: WDI  Neuro: Mental Status: Patient is awake, alert, oriented to  person, place, month, year, and situation. Patient is able to give a clear and coherent history. No signs of aphasia or neglect Cranial Nerves: II: Visual Fields are full.   III,IV, VI: EOMI without ptosis or diploplia. Pupils are equal, round, and reactive to light.  Due to Bell's palsy patient cannot bury her right eyelashes V: Facial sensation grossly intact VII:  right whole facial weakness chronic from prior Bell's VIII: hearing is intact to voice X: Uvula elevates symmetrically XI: Shoulder shrug is symmetric. XII: tongue is midline without atrophy or fasciculations.  Motor: Tone is normal. Bulk is normal. 5/5 strength was present in all four extremities.  Sensory: Sensation is symmetric to light touch and temperature in the arms and legs. Deep Tendon Reflexes: 2+ and symmetric in the biceps and patellae.  Plantars: Toes are downgoing bilaterally.  Cerebellar: FNF and HKS are intact bilaterally  Labs I have reviewed labs in epic and the results pertinent to this consultation are: CBC    Component Value Date/Time   WBC 9.4 10/29/2018 1612   RBC 4.36 10/29/2018 1612   HGB 13.1 10/29/2018 1612   HCT 42.2 10/29/2018 1612   PLT 205 10/29/2018 1612   MCV 96.8 10/29/2018 1612   MCH 30.0 10/29/2018 1612   MCHC 31.0 10/29/2018 1612   RDW 12.4 10/29/2018 1612   LYMPHSABS 2.8 10/29/2018 1612   MONOABS 0.8 10/29/2018 1612   EOSABS 0.4 10/29/2018 1612   BASOSABS 0.1 10/29/2018 1612    CMP     Component Value Date/Time   NA 141 10/29/2018 1612   K 3.9 10/29/2018 1612   CL 104 10/29/2018 1612   CO2 24 10/29/2018 1612   GLUCOSE 133 (H) 10/29/2018 1612   BUN 24 (H) 10/29/2018 1612   CREATININE 1.20 (H) 10/29/2018 1615   CREATININE 1.04 12/25/2012 1609   CALCIUM 9.7 10/29/2018 1612   PROT 7.1 10/29/2018 1612   ALBUMIN 4.2 10/29/2018 1612   AST 21 10/29/2018 1612   ALT 21 10/29/2018 1612   ALKPHOS 42 10/29/2018 1612   BILITOT 0.5 10/29/2018 1612   GFRNONAA 46 (L)  10/29/2018 1612   GFRAA 53 (L) 10/29/2018 [place] PA-C [facility] [phone]  Attending addendum Patient seen and examined Patient seen as a code stroke for aphasia, symptoms resolved at the time of his examination. NIH stroke scale 0 on my exam . Imaging I have reviewed the images obtained: CT-scan of the brain-no evidence of acute intracranial abnormality with aspect score of 10  M-F  (9:00 am- 5:00 PM)  10/29/2018, 4:37 PM   Assessment:  73 year old female with past medical history of diabetes and current elevated blood pressures without a known history of hypertension, presenting to most [place] secondary to 30-minute period of expressive difficulties and slurred speech.  Upon arrival patient symptoms had resolved.  She has no known history of hypertension but her blood pressures  were markedly elevated on arrival. At this time differential includes hypertensive encephalopathy versus stroke/TIA.  Impression: Evaluate for hypertensive encephalopathy Evaluate for stroke/TIA  Recommend: # MRI of the brain without contrast #MRA Head without contrast and neck without contrast given borderline renal function #Transthoracic Echo, # Start patient on ASA 325mg  daily, #Start or continue Atorvastatin 80 mg/other high intensity statin # BP goal: permissive HTN upto 220/120 mmHg # HBAIC and Lipid profile # Telemetry monitoring # Frequent neuro checks # NPO until passes stroke swallow screen  Stroke team will follow this patient.  Please page stroke on call on [email or web address]  -- Amie [place], MD [facility] Pager: [phone] If 7pm to 7am, please call on call as listed on AMION.

## 2018-10-29 NOTE — Code Documentation (Signed)
73 yo female coming in private vehicle with complaints of sudden onset of trouble getting her words out and slurred speech at 1520. Triage called a Code Stroke and patient was taken to CT. CT Head negative for hemorrhage. Pt brought back to room and Stroke Team met patient in the room. Symptoms had fully resolved upon neurologist arrival. 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 NIHSS 2 - back to patient's baseline. TIA Alert noted and RN made aware. Handoff given to Joellen Jersey, Therapist, sports.

## 2018-10-29 NOTE — ED Notes (Signed)
Pt ambulated to restroom with steady gait.

## 2018-10-29 NOTE — ED Provider Notes (Signed)
[place] EMERGENCY DEPARTMENT Provider Note   CSN: [id number] Arrival date & time: 10/29/18  1548  An emergency department physician performed an initial assessment on this suspected stroke patient at 23.  History   Chief Complaint No chief complaint on file.   HPI Beth Macdonald is a 73 y.o. female.     Patient states that she felt fine at 3 PM this afternoon.  While she was having lunch.  Shortly thereafter she went to do some work on the computer and she noticed that she could not get her words out and that her speech seemed to be may be a little slurred.  Associated with some dizziness no headache no visual changes arms and legs were working fine no coordination problems.  Patient also felt as if she was having a little bit of memory recall problems as well.  Patient was a code stroke activation.     No past medical history on file.  Patient Active Problem List   Diagnosis Date Noted  . 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 Osteoarthritis of right knee 05/29/2016  . Sleep disturbance 05/29/2016  . 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 Dizzy 07/21/2015  . Rotator cuff syndrome of both shoulders 06/21/2015  . Pain in joint, ankle and foot 10/20/2012  . Bilateral leg weakness 10/20/2012    No past surgical history on file.   OB History   No obstetric history on file.      Home Medications    Prior to Admission medications   Medication Sig Start Date End Date Taking? Authorizing Provider  atorvastatin (LIPITOR) 20 MG tablet Take 20 mg by mouth daily.    [provider]  cephALEXin (KEFLEX) 500 MG capsule Take 1 capsule (500 mg total) by mouth 3 (three) times daily. 07/22/18   Trula Slade, DPM  HYDROcodone-acetaminophen (NORCO/VICODIN) 5-325 MG tablet Take 1 tablet by mouth every 4 (four) hours as needed. 07/17/18   Trula Slade, DPM  ibuprofen (ADVIL,MOTRIN) 800 MG tablet 1T EVERY 8 HOURS AS NEEDED FOR  PAIN 06/23/18   [provider]  metFORMIN (GLUCOPHAGE) 500 MG tablet 1 TABLET WITH MEALS ONCE A DAY FOR 3-5 DAYS THEN ONE TWICE DAILY 02/02/16   [provider]  metroNIDAZOLE (FLAGYL) 500 MG tablet TAKE 1 TABLET BY MOUTH THREE TIMES A DAY FOR 10 DAYS 05/14/18   [provider]  mupirocin ointment (BACTROBAN) 2 %  07/14/18   [provider]    Family History No family history on file.  Social History Social History   Tobacco Use  . Smoking status: Former Smoker    Last attempt to quit: 09/03/1981    Years since quitting: 37.1  . Smokeless tobacco: Never Used  Substance Use Topics  . Alcohol use: Not on file  . Drug use: Not on file     Allergies   Patient has no known allergies.   Review of Systems Review of Systems  Constitutional: Negative for chills and fever.  HENT: Negative for rhinorrhea and sore throat.   Eyes: Negative for visual disturbance.  Respiratory: Negative for cough and shortness of breath.   Cardiovascular: Negative for chest pain and leg swelling.  Gastrointestinal: Negative for abdominal pain, diarrhea, nausea and vomiting.  Genitourinary: Negative for dysuria.  Musculoskeletal: Negative for back pain and neck pain.  Skin: Negative for rash.  Neurological: Positive for dizziness, facial asymmetry and speech difficulty. Negative for light-headedness and headaches.  Hematological: Does not bruise/bleed  easily.  Psychiatric/Behavioral: Positive for confusion.     Physical Exam Updated Vital Signs BP (!) 197/91 (BP Location: Right Arm)   Pulse 80   Temp 97.7 F (36.5 C) (Oral)   Resp 16   SpO2 100%   Physical Exam Vitals signs and nursing note reviewed.  Constitutional:      General: She is not in acute distress.    Appearance: Normal appearance. She is well-developed.  HENT:     Head: Normocephalic and atraumatic.     Mouth/Throat:     Mouth: Mucous membranes are moist.  Eyes:     Extraocular Movements:  Extraocular movements intact.     Conjunctiva/sclera: Conjunctivae normal.     Pupils: Pupils are equal, round, and reactive to light.  Neck:     Musculoskeletal: Normal range of motion and neck supple.  Cardiovascular:     Rate and Rhythm: Normal rate and regular rhythm.     Heart sounds: Normal heart sounds. No murmur.  Pulmonary:     Effort: Pulmonary effort is normal. No respiratory distress.     Breath sounds: Normal breath sounds.  Abdominal:     General: Bowel sounds are normal.     Palpations: Abdomen is soft.     Tenderness: There is no abdominal tenderness.  Musculoskeletal: Normal range of motion.  Skin:    General: Skin is warm and dry.  Neurological:     Mental Status: She is alert.     Cranial Nerves: Cranial nerve deficit present.     Sensory: No sensory deficit.     Motor: No weakness.     Coordination: Coordination normal.     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      ED Treatments / Results  Labs (all labs ordered are listed, but only abnormal results are displayed) Labs Reviewed  I-STAT CREATININE, ED - Abnormal; Notable for the following components:      Result Value   Creatinine, Ser 1.20 (*)    All other components within normal limits  CBG MONITORING, ED - Abnormal; Notable for the following components:   Glucose-Capillary 135 (*)    All other components within normal limits  CBC  DIFFERENTIAL  PROTIME-INR  APTT  COMPREHENSIVE METABOLIC PANEL    EKG None  Radiology Ct Head Code Stroke Wo Contrast  Result Date: 10/29/2018 CLINICAL DATA:  Code stroke. Confusion and brief episode of difficulty speaking. Pre-existing facial droop. EXAM: CT HEAD WITHOUT CONTRAST TECHNIQUE: Contiguous axial images were obtained from the base of the skull through the vertex without intravenous contrast. COMPARISON:  None. FINDINGS: Brain: There is no evidence of acute infarct, intracranial hemorrhage, mass, midline shift,  or extra-axial fluid collection. The ventricles and sulci are normal. There is a partially empty sella. Mild cerebral atrophy is within normal limits for age. No significant white matter disease is evident for age. Vascular: Calcified atherosclerosis at the skull base. No hyperdense vessel. Skull: No fracture or focal osseous lesion. Sinuses/Orbits: Small right maxillary sinus mucous retention cyst. Clear mastoid air cells. Bilateral cataract extraction. Other: None. ASPECTS [facility] Stroke Program Early CT Score) - Ganglionic level infarction (caudate, lentiform nuclei, internal capsule, insula, M1-M3 cortex): 7 - Supraganglionic infarction (M4-M6 cortex): 3 Total score (0-10 with 10 being normal): 10 IMPRESSION: 1. No evidence of acute intracranial abnormality. 2. ASPECTS is 10. These results were called by telephone at the time of interpretation on 10/29/2018 at 4:11 pm to Dr. Fredia Sorrow , who verbally  acknowledged these results. Electronically Signed   By: Logan Bores M.D.   On: 10/29/2018 16:12    Procedures Procedures (including critical care time)  CRITICAL CARE Performed by: Fredia Sorrow Total critical care time: 30 minutes Critical care time was exclusive of separately billable procedures and treating other patients. Critical care was necessary to treat or prevent imminent or life-threatening deterioration. Critical care was time spent personally by me on the following activities: development of treatment plan with patient and/or surrogate as well as nursing, discussions with consultants, evaluation of patient's response to treatment, examination of patient, obtaining history from patient or surrogate, ordering and performing treatments and interventions, ordering and review of laboratory studies, ordering and review of radiographic studies, pulse oximetry and re-evaluation of patient's condition.   Medications Ordered in ED Medications  sodium chloride flush (NS) 0.9 % injection 3  mL (has no administration in time range)     Initial Impression / Assessment and Plan / ED Course  I have reviewed the triage vital signs and the nursing notes.  Pertinent labs & imaging results that were available during my care of the patient were reviewed by me and considered in my medical decision making (see chart for details).        Patient with concern for CVA.  Following CT scan symptoms almost completely resolved patient back to normal so more consistent with a TIA.  Patient also with marked elevation in her blood pressure.  Not clear how long that is been present but patient does not have a history of hypertension.  Not on any medications for that.  Is also possible symptoms could be related to hypertensive emergency.  Discussed with the stroke team and neurologist and they are recommending as long as blood pressure stays below 170 systolic no intervention at this point in time.  MRI ordered.  Discussed with hospitalist they will admit.  Patient was a code stroke activation.  Patient not a candidate for TPA or any acute intervention at this time.    Final Clinical Impressions(s) / ED Diagnoses   Final diagnoses:  TIA (transient ischemic attack)    ED Discharge Orders    None       Fredia Sorrow, MD 10/29/18 [phone]

## 2018-10-29 NOTE — H&P (Signed)
History and Physical    Beth Macdonald LEX:[id number] DOB: 1945-11-10 DOA: 10/29/2018  PCP: [name], MD   Patient coming from: Home  Chief Complaint: Dysarthria and difficulty finding words.   HPI: Beth Macdonald is a 73 y.o. female with medical history significant of type 2 diabetes mellitus, dyslipidemia, and Bell's palsy, who presents with sudden focal neurologic deficit.  She has been at her usual state of health, until this pm when she suddenly noticed significant slurred speech and difficulty finding words.  It occurred while she was ambulating, denied any associated weakness, no improving, no worsening factors.  It has been transitory now by the time of my examination it has resolved.  Her symptoms started 3:15 PM, she was immediately brought to the hospital, by the time of her arrival to the ED 10 minutes later, her symptoms have been already improving.   ED Course: Patient was found with a focal neurologic deficit, head CT negative for acute changes, neurology has been consulted, MRI of the brain has been ordered, referred for further observation.  Review of Systems:  1. General: No fevers, no chills, no weight gain or weight loss 2. ENT: No runny nose or sore throat, no hearing disturbances 3. Pulmonary: No dyspnea, cough, wheezing, or hemoptysis 4. Cardiovascular: No angina, claudication, lower extremity edema, pnd or orthopnea 5. Gastrointestinal: No nausea or vomiting, no diarrhea or constipation 6. Hematology: No easy bruisability or frequent infections 7. Urology: No dysuria, hematuria or increased urinary frequency 8. Dermatology: No rashes. 9. Neurology: No seizures or paresthesias 10. Musculoskeletal: No joint pain or deformities  Past Medical History:  Diagnosis Date  . Diabetes mellitus without complication (HCC)    Type 2  . Facial paralysis/Bells palsy   . Hyperlipidemia   . Neck pain   . Skull fracture [facility])     Past Surgical History:    Procedure Laterality Date  . shoulder Bilateral      reports that she quit smoking about 37 years ago. She has never used smokeless tobacco. She reports current alcohol use. No history on file for drug.  Allergies  Allergen Reactions  . Tetracyclines & Related Other (See Comments)    "Feel bad"    Family History  Problem Relation Age of Onset  . Hypertension Mother   . Hypertension Father      Prior to Admission medications   Medication Sig Start Date End Date Taking? Authorizing Provider  atorvastatin (LIPITOR) 20 MG tablet Take 20 mg by mouth daily.   Yes [provider]  ibuprofen (ADVIL,MOTRIN) 800 MG tablet Take 800 mg by mouth daily.  06/23/18  Yes [provider]  MAGNESIUM PO Take 1 tablet by mouth daily.   Yes [provider]  metFORMIN (GLUCOPHAGE) 500 MG tablet Take 1,000 mg by mouth 2 (two) times daily.  02/02/16  Yes [provider]  OMEGA-3 FATTY ACIDS PO Take 1 tablet by mouth daily.   Yes [provider]  vitamin E 1000 UNIT capsule Take 1,000 Units by mouth daily.   Yes [provider]    Physical Exam: Vitals:   10/29/18 1603 10/29/18 1617 10/29/18 1627 10/29/18 1700  BP:  (!) 197/91  (!) 159/71  Pulse:  80  73  Resp:  16  12  Temp:   97.7 F (36.5 C)   TempSrc:   Oral   SpO2:  100%  100%  Weight: 81.6 kg     Height: 5\' 8"  (1.727 m)  Vitals:   10/29/18 1603 10/29/18 1617 10/29/18 1627 10/29/18 1700  BP:  (!) 197/91  (!) 159/71  Pulse:  80  73  Resp:  16  12  Temp:   97.7 F (36.5 C)   TempSrc:   Oral   SpO2:  100%  100%  Weight: 81.6 kg     Height: 5\' 8"  (1.727 m)      General: Not in pain or dyspnea  Neurology: Awake and alert, non focal/ right facial paralysis.  Head and Neck. Head normocephalic. Neck supple with no adenopathy or thyromegaly.   E ENT: no pallor, no icterus, oral mucosa moist Cardiovascular: No JVD. S1-S2 present, rhythmic, no gallops, rubs, or murmurs. No lower  extremity edema. No carotid bruits.  Pulmonary: vesicular breath sounds bilaterally, adequate air movement, no wheezing, rhonchi or rales. Gastrointestinal. Abdomen with, no organomegaly, non tender, no rebound or guarding Skin. No rashes Musculoskeletal: no joint deformities    Labs on Admission: I have personally reviewed following labs and imaging studies  CBC: Recent Labs  Lab 10/29/18 1612  WBC 9.4  NEUTROABS 5.3  HGB 13.1  HCT 42.2  MCV 96.8  PLT 035   Basic Metabolic Panel: Recent Labs  Lab 10/29/18 1612 10/29/18 1615  NA 141  --   K 3.9  --   CL 104  --   CO2 24  --   GLUCOSE 133*  --   BUN 24*  --   CREATININE 1.17* 1.20*  CALCIUM 9.7  --    GFR: Estimated Creatinine Clearance: 46.1 mL/min (A) (by C-G formula based on SCr of 1.2 mg/dL (H)). Liver Function Tests: Recent Labs  Lab 10/29/18 1612  AST 21  ALT 21  ALKPHOS 42  BILITOT 0.5  PROT 7.1  ALBUMIN 4.2   No results for input(s): LIPASE, AMYLASE in the last 168 hours. No results for input(s): AMMONIA in the last 168 hours. Coagulation Profile: Recent Labs  Lab 10/29/18 1612  INR 0.9   Cardiac Enzymes: No results for input(s): CKTOTAL, CKMB, CKMBINDEX, TROPONINI in the last 168 hours. BNP (last 3 results) No results for input(s): PROBNP in the last 8760 hours. HbA1C: No results for input(s): HGBA1C in the last 72 hours. CBG: Recent Labs  Lab 10/29/18 1621  GLUCAP 135*   Lipid Profile: No results for input(s): CHOL, HDL, LDLCALC, TRIG, CHOLHDL, LDLDIRECT in the last 72 hours. Thyroid Function Tests: No results for input(s): TSH, T4TOTAL, FREET4, T3FREE, THYROIDAB in the last 72 hours. Anemia Panel: No results for input(s): VITAMINB12, FOLATE, FERRITIN, TIBC, IRON, RETICCTPCT in the last 72 hours. Urine analysis: No results found for: COLORURINE, APPEARANCEUR, LABSPEC, PHURINE, GLUCOSEU, HGBUR, BILIRUBINUR, KETONESUR, PROTEINUR, UROBILINOGEN, NITRITE, LEUKOCYTESUR  Radiological  Exams on Admission: Ct Head Code Stroke Wo Contrast  Result Date: 10/29/2018 CLINICAL DATA:  Code stroke. Confusion and brief episode of difficulty speaking. Pre-existing facial droop. EXAM: CT HEAD WITHOUT CONTRAST TECHNIQUE: Contiguous axial images were obtained from the base of the skull through the vertex without intravenous contrast. COMPARISON:  None. FINDINGS: Brain: There is no evidence of acute infarct, intracranial hemorrhage, mass, midline shift, or extra-axial fluid collection. The ventricles and sulci are normal. There is a partially empty sella. Mild cerebral atrophy is within normal limits for age. No significant white matter disease is evident for age. Vascular: Calcified atherosclerosis at the skull base. No hyperdense vessel. Skull: No fracture or focal osseous lesion. Sinuses/Orbits: Small right maxillary sinus mucous retention cyst. Clear mastoid air cells. Bilateral cataract  extraction. Other: None. ASPECTS [facility] Stroke Program Early CT Score) - Ganglionic level infarction (caudate, lentiform nuclei, internal capsule, insula, M1-M3 cortex): 7 - Supraganglionic infarction (M4-M6 cortex): 3 Total score (0-10 with 10 being normal): 10 IMPRESSION: 1. No evidence of acute intracranial abnormality. 2. ASPECTS is 10. These results were called by telephone at the time of interpretation on 10/29/2018 at 4:11 pm to Dr. Fredia Sorrow , who verbally acknowledged these results. Electronically Signed   By: Logan Bores M.D.   On: 10/29/2018 16:12    EKG: Independently reviewed.  Normal sinus rhythm, 80 bpm, normal axis, normal intervals, no significant waveform abnormalities.   Assessment/Plan Active Problems:   TIA (transient ischemic attack)   73 year old female with type 2 diabetes mellitus and dyslipidemia who presents with a sudden focal neurologic deficit.  Slurred speech and difficulty finding words for about 10 to 15 minutes in duration.  By the time of my examination her symptoms  have resolved.  Her blood pressure was 174/67, heart rate 80, respiratory rate 16, oxygenation 100% on room air.  She does have right facial paralysis, her lungs are clear to auscultation bilaterally, heart S1-S2 present rhythmic, the abdomen soft nontender, no lower extremity edema, no focal neurologic deficit besides right facial paralysis.  Sodium 141, potassium 3.9, chloride 104, bicarb 24, glucose 133, BUN 24, creatinine 1.17, AST 21, ALT 21, troponin 0.00, white count 9.4, hemoglobin 13.1, hematocrit 42.2, platelets 205.  Head CT negative for acute intracranial changes.  Patient will be admitted to the hospital with a working diagnosis of focal neurologic deficit, transitory ischemic attack.  1.  TIA.  Patient will be admitted to the medical telemetry unit, continue neurochecks every 4 hours, swallow evaluation, physical therapy evaluation and neurology consultation.  Will allow permissive hypertension during the first 24 hours, will add aspirin and continue atorvastatin.  Further work-up with carotid ultrasonography and echocardiography.  Check lipid panel.  2.  Type 2 diabetes mellitus.  Admission glucose 133, hold on oral hypoglycemic agents, will add insulin sliding scale for glucose coverage and monitoring.  3.  Dyslipidemia.  Continue statin therapy with atorvastatin.    DVT prophylaxis: enoxaparin  Code Status: full  Family Communication: I spoke with patient's family at the bedside and all questions were addressed.   Disposition Plan: telemetry   Consults called: neurology   Admission status:  Observation.     Mauricio Gerome Apley MD [facility]   10/29/2018, 5:20 PM

## 2018-10-29 NOTE — ED Notes (Signed)
RN sent 2 visitors to Pt Rm 

## 2018-10-30 ENCOUNTER — Observation Stay (HOSPITAL_BASED_OUTPATIENT_CLINIC_OR_DEPARTMENT_OTHER): Payer: MEDICARE

## 2018-10-30 ENCOUNTER — Observation Stay (HOSPITAL_COMMUNITY): Payer: MEDICARE

## 2018-10-30 DIAGNOSIS — G459 Transient cerebral ischemic attack, unspecified: Secondary | ICD-10-CM | POA: Diagnosis not present

## 2018-10-30 DIAGNOSIS — E785 Hyperlipidemia, unspecified: Secondary | ICD-10-CM | POA: Diagnosis not present

## 2018-10-30 DIAGNOSIS — I6523 Occlusion and stenosis of bilateral carotid arteries: Secondary | ICD-10-CM | POA: Diagnosis not present

## 2018-10-30 DIAGNOSIS — E1169 Type 2 diabetes mellitus with other specified complication: Secondary | ICD-10-CM | POA: Diagnosis not present

## 2018-10-30 LAB — BASIC METABOLIC PANEL
Anion gap: 10 (ref 5–15)
BUN: 22 mg/dL (ref 8–23)
CO2: 26 mmol/L (ref 22–32)
Calcium: 9.5 mg/dL (ref 8.9–10.3)
Chloride: 106 mmol/L (ref 98–111)
Creatinine, Ser: 0.95 mg/dL (ref 0.44–1.00)
GFR calc Af Amer: >60 mL/min (ref >60)
GFR calc non Af Amer: 59 mL/min — ABNORMAL LOW (ref >60)
Glucose, Bld: 118 mg/dL — ABNORMAL HIGH (ref 70–99)
Potassium: 3.7 mmol/L (ref 3.5–5.1)
Sodium: 142 mmol/L (ref 135–145)

## 2018-10-30 LAB — HEMOGLOBIN A1C
Hgb A1c MFr Bld: 6.6 % — ABNORMAL HIGH (ref 4.8–5.6)
Mean Plasma Glucose: 142.72 mg/dL

## 2018-10-30 LAB — LIPID PANEL
Cholesterol: 198 mg/dL (ref 0–200)
HDL: 57 mg/dL (ref >40)
LDL Cholesterol: 100 mg/dL — ABNORMAL HIGH (ref 0–99)
Total CHOL/HDL Ratio: 3.5 RATIO
Triglycerides: 207 mg/dL — ABNORMAL HIGH (ref <150)
VLDL: 41 mg/dL — ABNORMAL HIGH (ref 0–40)

## 2018-10-30 LAB — GLUCOSE, CAPILLARY
Glucose-Capillary: 109 mg/dL — ABNORMAL HIGH (ref 70–99)
Glucose-Capillary: 164 mg/dL — ABNORMAL HIGH (ref 70–99)

## 2018-10-30 LAB — ECHOCARDIOGRAM COMPLETE
Height: 68 in
Weight: 2990.4 oz

## 2018-10-30 MED ORDER — AMLODIPINE BESYLATE 2.5 MG PO TABS
2.5000 mg | ORAL_TABLET | Freq: Every day | ORAL | Status: DC
  Administered 2018-10-30: 2.5 mg via ORAL
  Filled 2018-10-30: qty 1

## 2018-10-30 MED ORDER — IOPAMIDOL (ISOVUE-370) INJECTION 76%
INTRAVENOUS | Status: AC
  Filled 2018-10-30: qty 100

## 2018-10-30 MED ORDER — IOPAMIDOL (ISOVUE-370) INJECTION 76%
75.0000 mL | Freq: Once | INTRAVENOUS | Status: AC | PRN
  Administered 2018-10-30: 75 mL via INTRAVENOUS

## 2018-10-30 MED ORDER — ASPIRIN 81 MG PO TBEC: 81.0000 mg | DELAYED_RELEASE_TABLET | Freq: Every day | ORAL | 0 refills | Status: DC

## 2018-10-30 MED ORDER — CLOPIDOGREL BISULFATE 75 MG PO TABS: 75.0000 mg | ORAL_TABLET | Freq: Every day | ORAL | Status: DC

## 2018-10-30 MED ORDER — CLOPIDOGREL BISULFATE 75 MG PO TABS: 75.0000 mg | ORAL_TABLET | Freq: Every day | ORAL | 0 refills | Status: DC

## 2018-10-30 MED ORDER — AMLODIPINE BESYLATE 2.5 MG PO TABS: 2.5000 mg | ORAL_TABLET | Freq: Every day | ORAL | 0 refills | Status: DC

## 2018-10-30 NOTE — Progress Notes (Signed)
Nurse went over discharge with patient and family. Patient and family verbalized understanding of discharge. All questions and concerns addressed. All belong sent home with patient. Patient taken down in a wheelchair.

## 2018-10-30 NOTE — Progress Notes (Addendum)
STROKE TEAM PROGRESS NOTE   INTERVAL HISTORY Her wife is at the bedside.  Aphasia from yesterday has resolved. Stroke workup underway. Pt "healthy" at baseline and works as an Metallurgist.   Vitals:   10/30/18 0000 10/30/18 0200 10/30/18 0400 10/30/18 0820  BP: (!) 175/75 (!) 165/65 (!) 158/68 (!) 189/69  Pulse: 70   (!) 57  Resp:    12  Temp: 97.8 F (36.6 C) 98.5 F (36.9 C) 97.7 F (36.5 C) 97.9 F (36.6 C)  TempSrc: Oral Oral Oral Oral  SpO2: 97% 98% 97% 98%  Weight:      Height:        CBC:  Recent Labs  Lab 10/29/18 1612  WBC 9.4  NEUTROABS 5.3  HGB 13.1  HCT 42.2  MCV 96.8  PLT 341    Basic Metabolic Panel:  Recent Labs  Lab 10/29/18 1612 10/29/18 1615 10/30/18 0743  NA 141  --  142  K 3.9  --  3.7  CL 104  --  106  CO2 24  --  26  GLUCOSE 133*  --  118*  BUN 24*  --  22  CREATININE 1.17* 1.20* 0.95  CALCIUM 9.7  --  9.5   Lipid Panel:     Component Value Date/Time   CHOL 198 10/30/2018 0536   TRIG 207 (H) 10/30/2018 0536   HDL 57 10/30/2018 0536   CHOLHDL 3.5 10/30/2018 0536   VLDL 41 (H) 10/30/2018 0536   LDLCALC 100 (H) 10/30/2018 0536   HgbA1c:  Lab Results  Component Value Date   HGBA1C 6.6 (H) 10/30/2018   Urine Drug Screen: No results found for: LABOPIA, COCAINSCRNUR, LABBENZ, AMPHETMU, THCU, LABBARB  Alcohol Level No results found for: ETH  IMAGING Ct Angio Head W Or Wo Contrast  Addendum Date: 10/30/2018   ADDENDUM REPORT: 10/30/2018 11:33 ADDENDUM: Mild beading of the left and possibly right internal carotid arteries which may reflect fibromuscular dysplasia. Electronically Signed   By: Logan Bores M.D.   On: 10/30/2018 11:33   Result Date: 10/30/2018 CLINICAL DATA:  TIA.  Transient slurred speech. EXAM: CT ANGIOGRAPHY HEAD AND NECK TECHNIQUE: Multidetector CT imaging of the head and neck was performed using the standard protocol during bolus administration of intravenous contrast. Multiplanar CT image reconstructions and  MIPs were obtained to evaluate the vascular anatomy. Carotid stenosis measurements (when applicable) are obtained utilizing NASCET criteria, using the distal internal carotid diameter as the denominator. CONTRAST:  53mL ISOVUE-370 IOPAMIDOL (ISOVUE-370) INJECTION 76% COMPARISON:  Head CT and MRI 10/29/2018. No prior angiographic imaging. FINDINGS: CTA NECK FINDINGS Aortic arch: Normal variant aortic arch branching pattern with common origin of the brachiocephalic and left common carotid arteries. Mild arch atherosclerosis. Widely patent arch vessel origins. Right carotid system: Patent with minimal calcified plaque in the proximal ICA. No evidence of stenosis or dissection. Possible subtle beading of the mid cervical ICA. Left carotid system: Patent with mild calcified plaque in the proximal ICA. No evidence of stenosis or dissection. Mild beading of the mid cervical ICA. Vertebral arteries: Patent without evidence of stenosis or dissection. Mildly dominant left vertebral artery. Skeleton: Moderate mid cervical disc degeneration and moderate upper cervical facet arthrosis. Moderate spinal stenosis at C4-5 due to a broad-based posterior disc osteophyte complex. Moderate neural foraminal stenosis due to uncovertebral spurring on the left at C4-5 and bilaterally at C5-6. Other neck: No evidence of acute abnormality or mass. Upper chest: Partially visualized 20 x 16 mm nodular opacity in  the superior segment of the right lower lobe as well as a separate 6 mm right lower lobe nodule abutting the major fissure. Partially visualized borderline enlarged right hilar lymph nodes measuring up to 10 mm in short axis. Review of the MIP images confirms the above findings CTA HEAD FINDINGS Anterior circulation: The internal carotid arteries are patent from skull base to carotid termini with mild nonstenotic plaque bilaterally. ACAs and MCAs are patent without evidence of proximal branch occlusion or significant proximal stenosis.  No aneurysm is identified. Posterior circulation: The intracranial vertebral arteries are widely patent to the basilar. Patent PICA, AICA, and SCA origins are identified bilaterally. The basilar artery is widely patent. There are small right and likely diminutive left posterior communicating arteries. The right P1 segment is widely patent, however there are moderate mid P2 and severe distal P2/P3 stenoses of the right PCA. There is a severe stenosis of the left PCA near the P1-P2 junction. Slightly distal to this, there is proximal left P2 occlusion with intermittent distal reconstitution. No aneurysm is identified. Venous sinuses: Patent. Anatomic variants: None. Delayed phase: No abnormal enhancement with note made of incomplete imaging through the inferior aspect of the cerebellum. Review of the MIP images confirms the above findings IMPRESSION: 1. Intracranial atherosclerosis predominantly involving the PCAs including proximal left P2 occlusion with intermittent distal reconstitution. 2. Moderate to severe right P2 and more distal stenoses. 3. Widely patent cervical carotid and vertebral arteries. 4. Partially visualized 20 x 16 mm right lower lobe pulmonary nodule and borderline right hilar lymphadenopathy. Chest CT is recommended for further evaluation. 5.  Aortic Atherosclerosis (ICD10-I70.0). Electronically Signed: By: Logan Bores M.D. On: 10/30/2018 10:43   Ct Angio Neck W Or Wo Contrast  Addendum Date: 10/30/2018   ADDENDUM REPORT: 10/30/2018 11:33 ADDENDUM: Mild beading of the left and possibly right internal carotid arteries which may reflect fibromuscular dysplasia. Electronically Signed   By: Logan Bores M.D.   On: 10/30/2018 11:33   Result Date: 10/30/2018 CLINICAL DATA:  TIA.  Transient slurred speech. EXAM: CT ANGIOGRAPHY HEAD AND NECK TECHNIQUE: Multidetector CT imaging of the head and neck was performed using the standard protocol during bolus administration of intravenous contrast.  Multiplanar CT image reconstructions and MIPs were obtained to evaluate the vascular anatomy. Carotid stenosis measurements (when applicable) are obtained utilizing NASCET criteria, using the distal internal carotid diameter as the denominator. CONTRAST:  87mL ISOVUE-370 IOPAMIDOL (ISOVUE-370) INJECTION 76% COMPARISON:  Head CT and MRI 10/29/2018. No prior angiographic imaging. FINDINGS: CTA NECK FINDINGS Aortic arch: Normal variant aortic arch branching pattern with common origin of the brachiocephalic and left common carotid arteries. Mild arch atherosclerosis. Widely patent arch vessel origins. Right carotid system: Patent with minimal calcified plaque in the proximal ICA. No evidence of stenosis or dissection. Possible subtle beading of the mid cervical ICA. Left carotid system: Patent with mild calcified plaque in the proximal ICA. No evidence of stenosis or dissection. Mild beading of the mid cervical ICA. Vertebral arteries: Patent without evidence of stenosis or dissection. Mildly dominant left vertebral artery. Skeleton: Moderate mid cervical disc degeneration and moderate upper cervical facet arthrosis. Moderate spinal stenosis at C4-5 due to a broad-based posterior disc osteophyte complex. Moderate neural foraminal stenosis due to uncovertebral spurring on the left at C4-5 and bilaterally at C5-6. Other neck: No evidence of acute abnormality or mass. Upper chest: Partially visualized 20 x 16 mm nodular opacity in the superior segment of the right lower lobe as well as a  separate 6 mm right lower lobe nodule abutting the major fissure. Partially visualized borderline enlarged right hilar lymph nodes measuring up to 10 mm in short axis. Review of the MIP images confirms the above findings CTA HEAD FINDINGS Anterior circulation: The internal carotid arteries are patent from skull base to carotid termini with mild nonstenotic plaque bilaterally. ACAs and MCAs are patent without evidence of proximal branch  occlusion or significant proximal stenosis. No aneurysm is identified. Posterior circulation: The intracranial vertebral arteries are widely patent to the basilar. Patent PICA, AICA, and SCA origins are identified bilaterally. The basilar artery is widely patent. There are small right and likely diminutive left posterior communicating arteries. The right P1 segment is widely patent, however there are moderate mid P2 and severe distal P2/P3 stenoses of the right PCA. There is a severe stenosis of the left PCA near the P1-P2 junction. Slightly distal to this, there is proximal left P2 occlusion with intermittent distal reconstitution. No aneurysm is identified. Venous sinuses: Patent. Anatomic variants: None. Delayed phase: No abnormal enhancement with note made of incomplete imaging through the inferior aspect of the cerebellum. Review of the MIP images confirms the above findings IMPRESSION: 1. Intracranial atherosclerosis predominantly involving the PCAs including proximal left P2 occlusion with intermittent distal reconstitution. 2. Moderate to severe right P2 and more distal stenoses. 3. Widely patent cervical carotid and vertebral arteries. 4. Partially visualized 20 x 16 mm right lower lobe pulmonary nodule and borderline right hilar lymphadenopathy. Chest CT is recommended for further evaluation. 5.  Aortic Atherosclerosis (ICD10-I70.0). Electronically Signed: By: Logan Bores M.D. On: 10/30/2018 10:43   Mr Brain Wo Contrast (neuro Protocol)  Result Date: 10/29/2018 CLINICAL DATA:  Slurred speech and dizziness EXAM: MRI HEAD WITHOUT CONTRAST TECHNIQUE: Multiplanar, multiecho pulse sequences of the brain and surrounding structures were obtained without intravenous contrast. COMPARISON:  Head CT 10/29/2018 FINDINGS: BRAIN: There is no acute infarct, acute hemorrhage, hydrocephalus or extra-axial collection. The midline structures are normal. No midline shift or other mass effect. Multifocal white matter  hyperintensity, most commonly due to chronic ischemic microangiopathy. Generalized atrophy without lobar predilection. Susceptibility-sensitive sequences show no chronic microhemorrhage or superficial siderosis. VASCULAR: Major intracranial arterial and venous sinus flow voids are normal. SKULL AND UPPER CERVICAL SPINE: Calvarial bone marrow signal is normal. There is no skull base mass. Visualized upper cervical spine and soft tissues are normal. SINUSES/ORBITS: No fluid levels or advanced mucosal thickening. No mastoid or middle ear effusion. The orbits are normal. IMPRESSION: Mild chronic small vessel disease.  No acute abnormality. Electronically Signed   By: Ulyses Jarred M.D.   On: 10/29/2018 22:43   Ct Head Code Stroke Wo Contrast  Result Date: 10/29/2018 CLINICAL DATA:  Code stroke. Confusion and brief episode of difficulty speaking. Pre-existing facial droop. EXAM: CT HEAD WITHOUT CONTRAST TECHNIQUE: Contiguous axial images were obtained from the base of the skull through the vertex without intravenous contrast. COMPARISON:  None. FINDINGS: Brain: There is no evidence of acute infarct, intracranial hemorrhage, mass, midline shift, or extra-axial fluid collection. The ventricles and sulci are normal. There is a partially empty sella. Mild cerebral atrophy is within normal limits for age. No significant white matter disease is evident for age. Vascular: Calcified atherosclerosis at the skull base. No hyperdense vessel. Skull: No fracture or focal osseous lesion. Sinuses/Orbits: Small right maxillary sinus mucous retention cyst. Clear mastoid air cells. Bilateral cataract extraction. Other: None. ASPECTS [facility] Stroke Program Early CT Score) - Ganglionic level infarction (caudate, lentiform nuclei,  internal capsule, insula, M1-M3 cortex): 7 - Supraganglionic infarction (M4-M6 cortex): 3 Total score (0-10 with 10 being normal): 10 IMPRESSION: 1. No evidence of acute intracranial abnormality. 2. ASPECTS is  10. These results were called by telephone at the time of interpretation on 10/29/2018 at 4:11 pm to Dr. Fredia Sorrow , who verbally acknowledged these results. Electronically Signed   By: Logan Bores M.D.   On: 10/29/2018 16:12   2D Echocardiogram   1. The left ventricle has normal systolic function with an ejection fraction of 60-65%. The cavity size was normal. Left ventricular diastolic Doppler parameters are consistent with impaired relaxation.  2. The right ventricle has normal systolic function. The cavity was normal. There is no increase in right ventricular wall thickness.  3. The mitral valve is degenerative. Mild thickening of the mitral valve leaflet. Mild calcification of the mitral valve leaflet.  4. The tricuspid valve is normal in structure.  5. The aortic valve is tricuspid Moderate thickening of the aortic valve Sclerosis without any evidence of stenosis of the aortic valve.  6. The pulmonic valve was normal in structure.  7. The aortic root is normal in size and structure.   PHYSICAL EXAM Constitutional: Appears well-developed and well-nourished.  Psych: Affect appropriate to situation Eyes: No scleral injection HENT: No OP obstrucion Head: Normocephalic.  Cardiovascular: Normal rate and regular rhythm.  Respiratory: Effort normal, non-labored breathing Skin: WDI  Neuro: Mental Status: Patient is awake, alert, oriented to person, place, month, year, and situation. Patient is able to give a clear and coherent history. No signs of aphasia or neglect Cranial Nerves: II: Visual Fields are full.   III,IV, VI: EOMI without ptosis or diploplia. Pupils are equal, round, and reactive to light.  Due to prior Bell's palsy patient cannot bury her right eyelashes V: Facial sensation grossly intact VII:  right whole facial weakness chronic from prior Bell's VIII: hearing is intact to voice X: Uvula elevates symmetrically XI: Shoulder shrug is symmetric. XII: tongue is  midline without atrophy or fasciculations.  Motor: Tone is normal. Bulk is normal. 5/5 strength was present in all four extremities. No drift. Sensory: Sensation is symmetric to light touch and temperature in the arms and legs. Plantars: Toes are downgoing bilaterally.  Cerebellar: FNF are intact bilaterally   ASSESSMENT/PLAN [name] is a 73 y.o. female with history of DB, HTN, skull fx, R Bell's presenting with transiet expressive aphasia and slurred speech.   L brain TIA  Code Stroke CT head No acute stroke. ASPECTS 10.     MRI  Mild Small vessel disease.   CTA head & neck posterior circulation atherosclerosis L>R, L P2 occlusion, mod to severe R P2. RLL pulm nodule and borderline R hilar lymphadenopathy. aortic atherosclerosis. beading L>R ICA .  2D Echo  EF 60-56%. No source of embolus   LDL 100  HgbA1c 6.6  Lovenox 40 mg sq daily for VTE prophylaxis  No antithrombotic prior to admission, now on aspirin 81 mg daily. Given TIA, recommend aspirin 81 mg and plavix 75 mg daily x 3 weeks, then aspirin alone. Orders adjusted.   Therapy recommendations:  No PT, no OT, No SLP  Disposition:  Return home  For completeness, consider OP tele monitoring to look for AF as possible cause of TIA Follow-up Stroke Clinic at [facility] in 4 weeks. Office will call with appointment date and time. Order placed.  Hypertension  Stable but elevated 160-180s . Permissive hypertension (OK if <  220/120) but gradually normalize in 5-7 days . Long-term BP goal normotensive  Hyperlipidemia  Home meds:  lipitor 20, resumed in hospital  LDL 100, goal < 70  Continue statin at discharge  Diabetes type II  HgbA1c 6.6, at goal < 7.0  Other Stroke Risk Factors  Advanced age  Other Active Problems  hx R Bell's w/ continued deficits  Hospital day # 0  Burnetta Sabin, MSN, APRN, ANVP-BC, AGPCNP-BC Advanced Practice Stroke Nurse [place] for Schedule & Pager information 10/30/2018 1:08 PM   ATTENDING NOTE: I reviewed above note and agree with the assessment and plan. Pt was seen and examined.   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  Her blood pressure was also high at presentation, however, her BP at home was normal.  CT no acute abnormality.  MRI negative for acute stroke.  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  EF 60 to 65%.  LDL 100 and A1c 6.6.  On examination, neuro intact, except right CN VII chronic dysfunction with decreased strength on eye closure, recent up Abrol, frowning as well as right facial droop.  Patient transient episode of expressive aphasia more consistent with TIA.  Given cortical presentation, recommend 30-day CardioNet monitoring to rule out A. fib.  Recommend aspirin 81 and Plavix 75 DAPT for 3 weeks and then aspirin alone.  Continue statin home medication on discharge.  Patient BP still at 1 50-1 60 during admission, however patient felt that is due to anxiety and stress in hospital. She is on low-dose amlodipine now. Recommend close follow-up PCP and home BP monitoring.    Neurology will sign off. Please call with questions. Pt will follow up with stroke clinic NP at [facility] in about 4 weeks. Thanks for the consult.  Rosalin Hawking, MD PhD Stroke Neurology 10/30/2018 2:58 PM    To contact Stroke Continuity provider, please refer to [email or web address] After hours, contact General Neurology

## 2018-10-30 NOTE — Progress Notes (Signed)
  Echocardiogram 2D Echocardiogram has been performed.  Burleigh Brockmann L Androw 10/30/2018, 9:50 AM

## 2018-10-30 NOTE — Discharge Summary (Signed)
Physician Discharge Summary  Beth Macdonald YIR:[id number] DOB: 1946/02/07 DOA: 10/29/2018  PCP: [name], MD  Admit date: 10/29/2018 Discharge date: 10/30/2018  Admitted From: Home  Disposition:  Home   Recommendations for Outpatient Follow-up:  1. Follow up with PCP in 1-2 weeks 2. Please obtain BMP/CBC in one week 3. Needs CT chest to evaluate lung nodule.  4. Needs referral to vascular due to possibly right internal carotid arteries which may reflect fibromuscular dysplasia. [facility] will contact patient for Holter monitor.  6. Monitor Blood pressure  7. Repeat lipid panel in 4 weeks.   Home Health: none  Discharge Condition: stable.  CODE STATUS: full code.  Diet recommendation: Heart Healthy   Brief/Interim Summary: 73 year old with past medical history significant for diabetes, Bell's palsy, dyslipidemia who presented with sudden focal neurological deficit.  Patient noticed the night of admission significant slurred speech and difficulty finding words.  Her symptoms resolved in the ed.   1-TIA;  MRI negative for stroke. Echocardiogram normal ejection fraction no source for emboli. CT angio with moderate stenosis posterior circulation. Discussed with neurology recommend Holter monitor. Patient will need aspirin and Plavix for 3 weeks then aspirin alone. Continue with statins. LDL 100.  We will continue with Lipitor.  Patient will work on diet  2-Hypertension; Blood pressure in the 180. Started very low-dose Norvasc 2.5 mg. Advised patient not to take the medication if her blood pressure is less than 130. Close follow-up with PCP.  3-diabetes; resume metformin 48 hours after IV contrast. Hemoglobin A1c at 6.6  Discharge Diagnoses:  Active Problems:   TIA (transient ischemic attack)   Dyslipidemia   Type 2 diabetes mellitus with hyperlipidemia [facility])    Discharge Instructions  Discharge Instructions    Ambulatory referral to Neurology    Complete by:  As directed    Follow up with stroke clinic NP (Jessica Vanschaick or Cecille Rubin, if both not available, consider Dr. Antony Contras, Dr. Bess Harvest, or Dr. Sarina Ill) at [facility] in about 4 weeks.   Diet - low sodium heart healthy   Complete by:  As directed    Increase activity slowly   Complete by:  As directed      Allergies as of 10/30/2018      Reactions   Tetracyclines & Related Other (See Comments)   "Feel bad"      Medication List    STOP taking these medications   ibuprofen 800 MG tablet Commonly known as:  ADVIL,MOTRIN   metFORMIN 500 MG tablet Commonly known as:  GLUCOPHAGE     TAKE these medications   amLODipine 2.5 MG tablet Commonly known as:  NORVASC Take 1 tablet (2.5 mg total) by mouth daily. Start taking on:  October 31, 2018   aspirin 81 MG EC tablet Take 1 tablet (81 mg total) by mouth daily. Start taking on:  October 31, 2018   atorvastatin 20 MG tablet Commonly known as:  LIPITOR Take 20 mg by mouth daily.   clopidogrel 75 MG tablet Commonly known as:  PLAVIX Take 1 tablet (75 mg total) by mouth daily.   MAGNESIUM PO Take 1 tablet by mouth daily.   OMEGA-3 FATTY ACIDS PO Take 1 tablet by mouth daily.   vitamin E 1000 UNIT capsule Take 1,000 Units by mouth daily.      Follow-up Information    [name], MD Follow up in 1 week(s).   Specialty:  Internal Medicine Contact information: [address]  [address]  [place] [id number] [phone]        [facility] Follow up in 4 week(s).   Specialty:  Neurology Why:  TIA clinic. office will call with appt date and time.  Contact information: [place] [phone]         Allergies  Allergen Reactions  . Tetracyclines & Related Other (See Comments)    "Feel bad"    Consultations: Neurology   Procedures/Studies: Ct Angio Head W Or Wo  Contrast  Addendum Date: 10/30/2018   ADDENDUM REPORT: 10/30/2018 11:33 ADDENDUM: Mild beading of the left and possibly right internal carotid arteries which may reflect fibromuscular dysplasia. Electronically Signed   By: Logan Bores M.D.   On: 10/30/2018 11:33   Result Date: 10/30/2018 CLINICAL DATA:  TIA.  Transient slurred speech. EXAM: CT ANGIOGRAPHY HEAD AND NECK TECHNIQUE: Multidetector CT imaging of the head and neck was performed using the standard protocol during bolus administration of intravenous contrast. Multiplanar CT image reconstructions and MIPs were obtained to evaluate the vascular anatomy. Carotid stenosis measurements (when applicable) are obtained utilizing NASCET criteria, using the distal internal carotid diameter as the denominator. CONTRAST:  71mL ISOVUE-370 IOPAMIDOL (ISOVUE-370) INJECTION 76% COMPARISON:  Head CT and MRI 10/29/2018. No prior angiographic imaging. FINDINGS: CTA NECK FINDINGS Aortic arch: Normal variant aortic arch branching pattern with common origin of the brachiocephalic and left common carotid arteries. Mild arch atherosclerosis. Widely patent arch vessel origins. Right carotid system: Patent with minimal calcified plaque in the proximal ICA. No evidence of stenosis or dissection. Possible subtle beading of the mid cervical ICA. Left carotid system: Patent with mild calcified plaque in the proximal ICA. No evidence of stenosis or dissection. Mild beading of the mid cervical ICA. Vertebral arteries: Patent without evidence of stenosis or dissection. Mildly dominant left vertebral artery. Skeleton: Moderate mid cervical disc degeneration and moderate upper cervical facet arthrosis. Moderate spinal stenosis at C4-5 due to a broad-based posterior disc osteophyte complex. Moderate neural foraminal stenosis due to uncovertebral spurring on the left at C4-5 and bilaterally at C5-6. Other neck: No evidence of acute abnormality or mass. Upper chest: Partially visualized 20  x 16 mm nodular opacity in the superior segment of the right lower lobe as well as a separate 6 mm right lower lobe nodule abutting the major fissure. Partially visualized borderline enlarged right hilar lymph nodes measuring up to 10 mm in short axis. Review of the MIP images confirms the above findings CTA HEAD FINDINGS Anterior circulation: The internal carotid arteries are patent from skull base to carotid termini with mild nonstenotic plaque bilaterally. ACAs and MCAs are patent without evidence of proximal branch occlusion or significant proximal stenosis. No aneurysm is identified. Posterior circulation: The intracranial vertebral arteries are widely patent to the basilar. Patent PICA, AICA, and SCA origins are identified bilaterally. The basilar artery is widely patent. There are small right and likely diminutive left posterior communicating arteries. The right P1 segment is widely patent, however there are moderate mid P2 and severe distal P2/P3 stenoses of the right PCA. There is a severe stenosis of the left PCA near the P1-P2 junction. Slightly distal to this, there is proximal left P2 occlusion with intermittent distal reconstitution. No aneurysm is identified. Venous sinuses: Patent. Anatomic variants: None. Delayed phase: No abnormal enhancement with note made of incomplete imaging through the inferior aspect of the cerebellum. Review of the MIP images confirms the above findings IMPRESSION: 1. Intracranial atherosclerosis  predominantly involving the PCAs including proximal left P2 occlusion with intermittent distal reconstitution. 2. Moderate to severe right P2 and more distal stenoses. 3. Widely patent cervical carotid and vertebral arteries. 4. Partially visualized 20 x 16 mm right lower lobe pulmonary nodule and borderline right hilar lymphadenopathy. Chest CT is recommended for further evaluation. 5.  Aortic Atherosclerosis (ICD10-I70.0). Electronically Signed: By: Logan Bores M.D. On:  10/30/2018 10:43   Ct Angio Neck W Or Wo Contrast  Addendum Date: 10/30/2018   ADDENDUM REPORT: 10/30/2018 11:33 ADDENDUM: Mild beading of the left and possibly right internal carotid arteries which may reflect fibromuscular dysplasia. Electronically Signed   By: Logan Bores M.D.   On: 10/30/2018 11:33   Result Date: 10/30/2018 CLINICAL DATA:  TIA.  Transient slurred speech. EXAM: CT ANGIOGRAPHY HEAD AND NECK TECHNIQUE: Multidetector CT imaging of the head and neck was performed using the standard protocol during bolus administration of intravenous contrast. Multiplanar CT image reconstructions and MIPs were obtained to evaluate the vascular anatomy. Carotid stenosis measurements (when applicable) are obtained utilizing NASCET criteria, using the distal internal carotid diameter as the denominator. CONTRAST:  26mL ISOVUE-370 IOPAMIDOL (ISOVUE-370) INJECTION 76% COMPARISON:  Head CT and MRI 10/29/2018. No prior angiographic imaging. FINDINGS: CTA NECK FINDINGS Aortic arch: Normal variant aortic arch branching pattern with common origin of the brachiocephalic and left common carotid arteries. Mild arch atherosclerosis. Widely patent arch vessel origins. Right carotid system: Patent with minimal calcified plaque in the proximal ICA. No evidence of stenosis or dissection. Possible subtle beading of the mid cervical ICA. Left carotid system: Patent with mild calcified plaque in the proximal ICA. No evidence of stenosis or dissection. Mild beading of the mid cervical ICA. Vertebral arteries: Patent without evidence of stenosis or dissection. Mildly dominant left vertebral artery. Skeleton: Moderate mid cervical disc degeneration and moderate upper cervical facet arthrosis. Moderate spinal stenosis at C4-5 due to a broad-based posterior disc osteophyte complex. Moderate neural foraminal stenosis due to uncovertebral spurring on the left at C4-5 and bilaterally at C5-6. Other neck: No evidence of acute abnormality or  mass. Upper chest: Partially visualized 20 x 16 mm nodular opacity in the superior segment of the right lower lobe as well as a separate 6 mm right lower lobe nodule abutting the major fissure. Partially visualized borderline enlarged right hilar lymph nodes measuring up to 10 mm in short axis. Review of the MIP images confirms the above findings CTA HEAD FINDINGS Anterior circulation: The internal carotid arteries are patent from skull base to carotid termini with mild nonstenotic plaque bilaterally. ACAs and MCAs are patent without evidence of proximal branch occlusion or significant proximal stenosis. No aneurysm is identified. Posterior circulation: The intracranial vertebral arteries are widely patent to the basilar. Patent PICA, AICA, and SCA origins are identified bilaterally. The basilar artery is widely patent. There are small right and likely diminutive left posterior communicating arteries. The right P1 segment is widely patent, however there are moderate mid P2 and severe distal P2/P3 stenoses of the right PCA. There is a severe stenosis of the left PCA near the P1-P2 junction. Slightly distal to this, there is proximal left P2 occlusion with intermittent distal reconstitution. No aneurysm is identified. Venous sinuses: Patent. Anatomic variants: None. Delayed phase: No abnormal enhancement with note made of incomplete imaging through the inferior aspect of the cerebellum. Review of the MIP images confirms the above findings IMPRESSION: 1. Intracranial atherosclerosis predominantly involving the PCAs including proximal left P2 occlusion with intermittent distal  reconstitution. 2. Moderate to severe right P2 and more distal stenoses. 3. Widely patent cervical carotid and vertebral arteries. 4. Partially visualized 20 x 16 mm right lower lobe pulmonary nodule and borderline right hilar lymphadenopathy. Chest CT is recommended for further evaluation. 5.  Aortic Atherosclerosis (ICD10-I70.0). Electronically  Signed: By: Logan Bores M.D. On: 10/30/2018 10:43   Mr Brain Wo Contrast (neuro Protocol)  Result Date: 10/29/2018 CLINICAL DATA:  Slurred speech and dizziness EXAM: MRI HEAD WITHOUT CONTRAST TECHNIQUE: Multiplanar, multiecho pulse sequences of the brain and surrounding structures were obtained without intravenous contrast. COMPARISON:  Head CT 10/29/2018 FINDINGS: BRAIN: There is no acute infarct, acute hemorrhage, hydrocephalus or extra-axial collection. The midline structures are normal. No midline shift or other mass effect. Multifocal white matter hyperintensity, most commonly due to chronic ischemic microangiopathy. Generalized atrophy without lobar predilection. Susceptibility-sensitive sequences show no chronic microhemorrhage or superficial siderosis. VASCULAR: Major intracranial arterial and venous sinus flow voids are normal. SKULL AND UPPER CERVICAL SPINE: Calvarial bone marrow signal is normal. There is no skull base mass. Visualized upper cervical spine and soft tissues are normal. SINUSES/ORBITS: No fluid levels or advanced mucosal thickening. No mastoid or middle ear effusion. The orbits are normal. IMPRESSION: Mild chronic small vessel disease.  No acute abnormality. Electronically Signed   By: Ulyses Jarred M.D.   On: 10/29/2018 22:43   Ct Head Code Stroke Wo Contrast  Result Date: 10/29/2018 CLINICAL DATA:  Code stroke. Confusion and brief episode of difficulty speaking. Pre-existing facial droop. EXAM: CT HEAD WITHOUT CONTRAST TECHNIQUE: Contiguous axial images were obtained from the base of the skull through the vertex without intravenous contrast. COMPARISON:  None. FINDINGS: Brain: There is no evidence of acute infarct, intracranial hemorrhage, mass, midline shift, or extra-axial fluid collection. The ventricles and sulci are normal. There is a partially empty sella. Mild cerebral atrophy is within normal limits for age. No significant white matter disease is evident for age.  Vascular: Calcified atherosclerosis at the skull base. No hyperdense vessel. Skull: No fracture or focal osseous lesion. Sinuses/Orbits: Small right maxillary sinus mucous retention cyst. Clear mastoid air cells. Bilateral cataract extraction. Other: None. ASPECTS [facility] Stroke Program Early CT Score) - Ganglionic level infarction (caudate, lentiform nuclei, internal capsule, insula, M1-M3 cortex): 7 - Supraganglionic infarction (M4-M6 cortex): 3 Total score (0-10 with 10 being normal): 10 IMPRESSION: 1. No evidence of acute intracranial abnormality. 2. ASPECTS is 10. These results were called by telephone at the time of interpretation on 10/29/2018 at 4:11 pm to Dr. Fredia Sorrow , who verbally acknowledged these results. Electronically Signed   By: Logan Bores M.D.   On: 10/29/2018 16:12     Subjective: She is back to baseline. Speech clear.   Discharge Exam: Vitals:   10/30/18 0820 10/30/18 1309  BP: (!) 189/69 115/79  Pulse: (!) 57 69  Resp: 12 16  Temp: 97.9 F (36.6 C) 98.4 F (36.9 C)  SpO2: 98% 95%     General: Pt is alert, awake, not in acute distress Cardiovascular: RRR, S1/S2 +, no rubs, no gallops Respiratory: CTA bilaterally, no wheezing, no rhonchi Abdominal: Soft, NT, ND, bowel sounds + Extremities: no edema, no cyanosis    The results of significant diagnostics from this hospitalization (including imaging, microbiology, ancillary and laboratory) are listed below for reference.     Microbiology: No results found for this or any previous visit (from the past 240 hour(s)).   Labs: BNP (last 3 results) No results for input(s): BNP in  the last 8760 hours. Basic Metabolic Panel: Recent Labs  Lab 10/29/18 1612 10/29/18 1615 10/30/18 0743  NA 141  --  142  K 3.9  --  3.7  CL 104  --  106  CO2 24  --  26  GLUCOSE 133*  --  118*  BUN 24*  --  22  CREATININE 1.17* 1.20* 0.95  CALCIUM 9.7  --  9.5   Liver Function Tests: Recent Labs  Lab 10/29/18 1612   AST 21  ALT 21  ALKPHOS 42  BILITOT 0.5  PROT 7.1  ALBUMIN 4.2   No results for input(s): LIPASE, AMYLASE in the last 168 hours. No results for input(s): AMMONIA in the last 168 hours. CBC: Recent Labs  Lab 10/29/18 1612  WBC 9.4  NEUTROABS 5.3  HGB 13.1  HCT 42.2  MCV 96.8  PLT 205   Cardiac Enzymes: No results for input(s): CKTOTAL, CKMB, CKMBINDEX, TROPONINI in the last 168 hours. BNP: Invalid input(s): POCBNP CBG: Recent Labs  Lab 10/29/18 1621 10/29/18 2117 10/30/18 0624 10/30/18 1146  GLUCAP 135* 114* 109* 164*   D-Dimer No results for input(s): DDIMER in the last 72 hours. Hgb A1c Recent Labs    10/30/18 0536  HGBA1C 6.6*   Lipid Profile Recent Labs    10/30/18 0536  CHOL 198  HDL 57  LDLCALC 100*  TRIG 207*  CHOLHDL 3.5   Thyroid function studies No results for input(s): TSH, T4TOTAL, T3FREE, THYROIDAB in the last 72 hours.  Invalid input(s): FREET3 Anemia work up No results for input(s): VITAMINB12, FOLATE, FERRITIN, TIBC, IRON, RETICCTPCT in the last 72 hours. Urinalysis No results found for: COLORURINE, APPEARANCEUR, LABSPEC, [place], GLUCOSEU, HGBUR, BILIRUBINUR, KETONESUR, PROTEINUR, UROBILINOGEN, NITRITE, LEUKOCYTESUR Sepsis Labs Invalid input(s): PROCALCITONIN,  WBC,  LACTICIDVEN Microbiology No results found for this or any previous visit (from the past 240 hour(s)).   Time coordinating discharge: 40 minutes  SIGNED:   Elmarie Shiley, MD  [facility]

## 2018-10-30 NOTE — Discharge Instructions (Signed)
Take aspirin and plavix for 3 weeks, then takes aspirin daily.   You can resume Metformin on 2-29. You received contrast for CT scan, hold metformin for this reason.   Please follow with your PCP to monitor your Blood pressure.

## 2018-10-30 NOTE — Care Management Note (Addendum)
Case Management Note  Patient Details  Name: NAUDIA CROSLEY MRN: [id number] Date of Birth: 1946/01/14  Subjective/Objective:    Pt in with TIA. She is from home with her significant other who can provide supervision and assistance.  DME at home: crutches but doesn't use Pt denies issues with home meds. Pt drives or her significant other can provide transportation.                Action/Plan: No f/u per PT. Awaiting OT/ST evals. CM following for d/c needs, physician orders.   Addendum (1335): pt discharging home with self care. No f/u per therapies. Pt has hospital f/u and transportation home.   Expected Discharge Date:                  Expected Discharge Plan:  Home/Self Care  In-House Referral:     Discharge planning Services     Post Acute Care Choice:    Choice offered to:     DME Arranged:    DME Agency:     HH Arranged:    HH Agency:     Status of Service:  In process, will continue to follow  If discussed at Long Length of Stay Meetings, dates discussed:    Additional Comments:  Pollie Friar, RN 10/30/2018, 11:41 AM

## 2018-10-30 NOTE — Progress Notes (Signed)
SLP Cancellation Note  Patient Details Name: Beth Macdonald MRN: [id number] DOB: Apr 11, 1946   Cancelled treatment:   Saw pt for cognitive linguistic evaluation following speech disturbance which precipitated ED visit.  Pt's symptoms have resolved.  Pt reports no difficulty finding words.  Pt's speech is clear and free from dysarthria, despite preexisting facial paralysis.  Pt was able to participate in conversation with no difficulty.  MRI 2/26 negative for acute findings.  Evaluation deferred at this time as pt has not ST needs.  Will sign off at this time. If there is a change in patient status, please reconsult SLP.        Celedonio Savage, MA, [facility] Office: [phone]; Pager (2/27): [phone] 10/30/2018, 10:27 AM

## 2018-10-30 NOTE — Evaluation (Signed)
Physical Therapy Evaluation & Discharge Patient Details Name: Beth Macdonald MRN: [id number] DOB: [birth year] Today's Date: 10/30/2018   History of Present Illness  Pt is a 73 y/o female admitted secondary to difficulty speaking. MRI of the brain was negative for any acute findings. PMH including but not limited to Bell's Palsy, DM and high cholesterol.    Clinical Impression  Pt presented OOB in bathroom, awake and willing to participate in therapy session. Prior to admission, pt reported that she was independent with all functional mobility and ADLs. 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 Pt is currently independent to supervision level for all functional mobility, including stair negotiation. 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 Pt with no further acute PT needs identified at this time. PT signing off.     Follow Up Recommendations No PT follow up    Equipment Recommendations  None recommended by PT    Recommendations for Other Services       Precautions / Restrictions Precautions Precautions: None Restrictions Weight Bearing Restrictions: No      Mobility  Bed Mobility Overal bed mobility: Independent                Transfers Overall transfer level: Independent Equipment used: None                Ambulation/Gait Ambulation/Gait assistance: Supervision Gait Distance (Feet): 400 Feet Assistive device: None Gait Pattern/deviations: WFL(Within Functional Limits) Gait velocity: able to fluctuate   General Gait Details: no instability or LOB, no need for physical assistance or UE supports  Stairs Stairs: Yes Stairs assistance: Supervision Stair Management: No rails;Alternating pattern;Forwards Number of Stairs: 2 General stair comments: no difficulties  Wheelchair Mobility    Modified Rankin (Stroke Patients  Only) Modified Rankin (Stroke Patients Only) Pre-Morbid Rankin Score: No symptoms Modified Rankin: No symptoms     Balance Overall balance assessment: Independent                               Standardized Balance Assessment Standardized Balance Assessment : Dynamic Gait Index   Dynamic Gait Index Level Surface: Normal Change in Gait Speed: Normal Gait with Horizontal Head Turns: Normal Gait with Vertical Head Turns: Normal Gait and Pivot Turn: Normal Step Over Obstacle: Normal Step Around Obstacles: Normal Steps: Normal Total Score: 24       Pertinent Vitals/Pain Pain Assessment: No/denies pain    Home Living Family/patient expects to be discharged to:: Private residence Living Arrangements: Spouse/significant other Available Help at Discharge: Family;Available 24 hours/day Type of Home: House Home Access: Level entry     Home Layout: One level Home Equipment: None      Prior Function Level of Independence: Independent         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, works 6hrs/week     Journalist, newspaper        Extremity/Trunk Assessment   Upper Extremity Assessment Upper Extremity Assessment: Overall WFL for tasks assessed    Lower Extremity Assessment Lower Extremity Assessment: Overall WFL for tasks assessed    Cervical / Trunk Assessment Cervical / Trunk Assessment: Normal  Communication   Communication: No difficulties  Cognition Arousal/Alertness: Awake/alert Behavior During Therapy: WFL for tasks assessed/performed Overall Cognitive Status: Within Functional Limits for tasks assessed  General Comments      Exercises     Assessment/Plan    PT Assessment Patent does not need any further PT services  PT Problem List         PT Treatment Interventions      PT Goals (Current goals can be found in the Care Plan section)  Acute Rehab PT Goals Patient Stated  Goal: find out the results of her tests PT Goal Formulation: All assessment and education complete, DC therapy    Frequency     Barriers to discharge        Co-evaluation               AM-PAC PT "6 Clicks" Mobility  Outcome Measure Help needed turning from your back to your side while in a flat bed without using bedrails?: None Help needed moving from lying on your back to sitting on the side of a flat bed without using bedrails?: None Help needed moving to and from a bed to a chair (including a wheelchair)?: None Help needed standing up from a chair using your arms (e.g., wheelchair or bedside chair)?: None Help needed to walk in hospital room?: None Help needed climbing 3-5 steps with a railing? : None 6 Click Score: 24    End of Session   Activity Tolerance: Patient tolerated treatment well Patient left: in bed;with call bell/phone within reach Nurse Communication: Mobility status PT Visit Diagnosis: Other symptoms and signs involving the nervous system (R29.898)    Time: 3606-7703 PT Time Calculation (min) (ACUTE ONLY): 23 min   Charges:   PT Evaluation $PT Eval Moderate Complexity: 1 Mod PT Treatments $Gait Training: 8-22 mins        Sherie Don, PT, DPT  Acute Rehabilitation Services Pager [phone] Office [place] 10/30/2018, 9:39 AM

## 2018-10-30 NOTE — Progress Notes (Signed)
OT Cancellation Note  Patient Details Name: Beth Macdonald MRN: [id number] DOB: 1945-11-23   Cancelled Treatment:    Reason Eval/Treat Not Completed: OT screened, no needs identified, will sign off. Pt is currently at baseline with no further acute OT needs. OT did recommend pt's partner provide supervision initially for shower transfers secondary to increased fall risk. Thank you for referral.  Gypsy Decant, MS, OTR/L 10/30/2018, 11:26 AM

## 2018-11-03 DIAGNOSIS — E119 Type 2 diabetes mellitus without complications: Secondary | ICD-10-CM | POA: Diagnosis not present

## 2018-11-03 DIAGNOSIS — E782 Mixed hyperlipidemia: Secondary | ICD-10-CM | POA: Diagnosis not present

## 2018-11-03 DIAGNOSIS — Z8673 Personal history of transient ischemic attack (TIA), and cerebral infarction without residual deficits: Secondary | ICD-10-CM | POA: Diagnosis not present

## 2018-11-03 DIAGNOSIS — I10 Essential (primary) hypertension: Secondary | ICD-10-CM | POA: Diagnosis not present

## 2018-11-10 DIAGNOSIS — I10 Essential (primary) hypertension: Secondary | ICD-10-CM | POA: Diagnosis not present

## 2018-11-10 DIAGNOSIS — E119 Type 2 diabetes mellitus without complications: Secondary | ICD-10-CM | POA: Diagnosis not present

## 2018-11-10 DIAGNOSIS — Z8673 Personal history of transient ischemic attack (TIA), and cerebral infarction without residual deficits: Secondary | ICD-10-CM | POA: Diagnosis not present

## 2018-11-10 DIAGNOSIS — E782 Mixed hyperlipidemia: Secondary | ICD-10-CM | POA: Diagnosis not present

## 2018-12-02 DIAGNOSIS — H43811 Vitreous degeneration, right eye: Secondary | ICD-10-CM | POA: Diagnosis not present

## 2018-12-04 DIAGNOSIS — E119 Type 2 diabetes mellitus without complications: Secondary | ICD-10-CM | POA: Diagnosis not present

## 2018-12-04 DIAGNOSIS — Z8673 Personal history of transient ischemic attack (TIA), and cerebral infarction without residual deficits: Secondary | ICD-10-CM | POA: Diagnosis not present

## 2018-12-04 DIAGNOSIS — E782 Mixed hyperlipidemia: Secondary | ICD-10-CM | POA: Diagnosis not present

## 2018-12-04 DIAGNOSIS — E1165 Type 2 diabetes mellitus with hyperglycemia: Secondary | ICD-10-CM | POA: Diagnosis not present

## 2018-12-04 DIAGNOSIS — I10 Essential (primary) hypertension: Secondary | ICD-10-CM | POA: Diagnosis not present

## 2018-12-08 ENCOUNTER — Telehealth: Payer: Self-pay

## 2018-12-08 DIAGNOSIS — Z0000 Encounter for general adult medical examination without abnormal findings: Secondary | ICD-10-CM | POA: Diagnosis not present

## 2018-12-08 DIAGNOSIS — I8393 Asymptomatic varicose veins of bilateral lower extremities: Secondary | ICD-10-CM | POA: Diagnosis not present

## 2018-12-08 DIAGNOSIS — I773 Arterial fibromuscular dysplasia: Secondary | ICD-10-CM | POA: Diagnosis not present

## 2018-12-08 DIAGNOSIS — M159 Polyosteoarthritis, unspecified: Secondary | ICD-10-CM | POA: Diagnosis not present

## 2018-12-08 DIAGNOSIS — R030 Elevated blood-pressure reading, without diagnosis of hypertension: Secondary | ICD-10-CM | POA: Diagnosis not present

## 2018-12-08 DIAGNOSIS — G459 Transient cerebral ischemic attack, unspecified: Secondary | ICD-10-CM | POA: Diagnosis not present

## 2018-12-08 DIAGNOSIS — R911 Solitary pulmonary nodule: Secondary | ICD-10-CM | POA: Diagnosis not present

## 2018-12-08 DIAGNOSIS — Z8673 Personal history of transient ischemic attack (TIA), and cerebral infarction without residual deficits: Secondary | ICD-10-CM | POA: Diagnosis not present

## 2018-12-08 DIAGNOSIS — M545 Low back pain: Secondary | ICD-10-CM | POA: Diagnosis not present

## 2018-12-08 DIAGNOSIS — Z1159 Encounter for screening for other viral diseases: Secondary | ICD-10-CM | POA: Diagnosis not present

## 2018-12-08 NOTE — Telephone Encounter (Signed)
I spoke with pt about her appt on Monday at 0815am. I explain we are only doing video for new pts due to [employer] [employer]. Pt gave new email address. Pt gave consent to file insurance and do video visit. Chart was updated with meds and allergies. I explain the webex needs to be download on her computer or phone if it has a camera. I stated she will get a email couple days prior to visit to join meeting. Pt was given number at office if she has any issues.

## 2018-12-09 ENCOUNTER — Other Ambulatory Visit: Payer: Self-pay | Admitting: Internal Medicine

## 2018-12-09 DIAGNOSIS — R911 Solitary pulmonary nodule: Secondary | ICD-10-CM

## 2018-12-10 NOTE — Telephone Encounter (Signed)
I called pt and she has the email for her visit on MOnday. She download webex on her computer but states it does not have a camera. Pt will download on her phone.

## 2018-12-11 ENCOUNTER — Other Ambulatory Visit: Payer: MEDICARE

## 2018-12-14 NOTE — Progress Notes (Deleted)
[facility] [address] [place]. [place] [address] [phone]       VIRTUAL VISIT FOLLOW UP NOTE  [name] Date of Birth:  03-25-1946 Medical Record Number:  [id number]   Reason for Referral:  hospital stroke follow up    Virtual Visit via Video Note  I connected with [name] on 12/14/18 at  8:15 AM EDT by a video enabled telemedicine application located remotely in my own home and verified that I am speaking with the correct person using two identifiers who was located at their own home.   I discussed the limitations of evaluation and management by telemedicine and the availability of in person appointments. The patient expressed understanding and agreed to proceed.   CHIEF COMPLAINT:  No chief complaint on file.   HPI: Beth Macdonald was initially scheduled today for in office hospital follow-up regarding *** but due to COVID-19 safety precautions, visit transition to telemedicine via WebEx. History obtained from *** and chart review. Reviewed all radiology images and labs personally.  Ms. Debroh Sieloff [facility] is a 73 y.o. 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 CT head reviewed and negative for acute infarct or abnormality. MRI brain reviewed which was negative for acute infarct or abnormality but did show evidence of small vessel disease. CTA head/neck showed posterior circulation atherosclerosis L>R, L P2 occlusion, mod to severe R P2, RLL pulm nodule and borderline R hilar lymphadenopathy along with aortic atherosclerosis and beading L>R ICA. 2D echo showed EF of 60-65% without cardiac source of embolus identified. Symptoms likely related to left brian TIA. Recommended DAPT x3 weeks then aspirin alone along with continuation of atorvastatin 20mg . HTN and DM stable with A1c 6.6. Other stroke risk factors include advanced age but no prior hx of stroke/TIA. Recommended  outpatient 30 day cardiac monitor to assess for AF as possible cause of TIA.  As all symptoms resolved, she was discharged home in stable condition without therapy needs.      ROS:   14 system review of systems performed and negative with exception of ***  PMH:  Past Medical History:  Diagnosis Date   Diabetes mellitus without complication (HCC)    Type 2   Facial paralysis/Bells palsy    Hyperlipidemia    Neck pain    Skull fracture (HCC)     PSH:  Past Surgical History:  Procedure Laterality Date   shoulder Bilateral     Social History:  Social History   Socioeconomic History   Marital status: Single    Spouse name: Not on file   Number of children: Not on file   Years of education: Not on file   Highest education level: Not on file  Occupational History   Not on file  Social Needs   Financial resource strain: Not on file   Food insecurity:    Worry: Not on file    Inability: Not on file   Transportation needs:    Medical: Not on file    Non-medical: Not on file  Tobacco Use   Smoking status: Former Smoker    Last attempt to quit: 09/03/1981    Years since quitting: 37.3   Smokeless tobacco: Never Used  Substance and Sexual Activity   Alcohol use: Yes    Comment: social/rarely   Drug use: Not on file   Sexual activity: Yes  Lifestyle   Physical activity:  Days per week: Not on file    Minutes per session: Not on file   Stress: Not on file  Relationships   Social connections:    Talks on phone: Not on file    Gets together: Not on file    Attends religious service: Not on file    Active member of club or organization: Not on file    Attends meetings of clubs or organizations: Not on file    Relationship status: Not on file   Intimate partner violence:    Fear of current or ex partner: Not on file    Emotionally abused: Not on file    Physically abused: Not on file    Forced sexual activity: Not on file  Other Topics  Concern   Not on file  Social History Narrative   Not on file    Family History:  Family History  Problem Relation Age of Onset   Hypertension Mother    Hypertension Father     Medications:   Current Outpatient Medications on File Prior to Visit  Medication Sig Dispense Refill   aspirin 81 MG EC tablet Take 1 tablet (81 mg total) by mouth daily. 30 tablet 0   atorvastatin (LIPITOR) 20 MG tablet Take 20 mg by mouth daily.     Ezetimibe (ZETIA PO) Take by mouth.     MAGNESIUM PO Take 1 tablet by mouth daily.     OMEGA-3 FATTY ACIDS PO Take 1 tablet by mouth daily.     vitamin E 1000 UNIT capsule Take 1,000 Units by mouth daily.     No current facility-administered medications on file prior to visit.     Allergies:   Allergies  Allergen Reactions   Tetracyclines & Related Other (See Comments)    "Feel bad"     Physical Exam  There were no vitals filed for this visit. There is no height or weight on file to calculate BMI. No exam data present  Depression screen [facility] 2/9 10/25/2016  Decreased Interest 0  Down, Depressed, Hopeless 0  PHQ - 2 Score 0     General: well developed, well nourished, seated, in no evident distress Head: head normocephalic and atraumatic.     Neurologic Exam Mental Status: Awake and fully alert. Oriented to place and time. Recent and remote memory intact. Attention span, concentration and fund of knowledge appropriate. Mood and affect appropriate.  Cranial Nerves: Fundoscopic exam reveals sharp disc margins. Pupils equal, briskly reactive to light. Extraocular movements full without nystagmus. Visual fields full to confrontation. Hearing intact. Facial sensation intact. Face, tongue, palate moves normally and symmetrically.  Motor: Normal bulk and tone. Normal strength in all tested extremity muscles. Sensory.: intact to touch , pinprick , position and vibratory sensation.  Coordination: Rapid alternating movements normal in all  extremities. Finger-to-nose and heel-to-shin performed accurately bilaterally. Gait and Station: Arises from chair without difficulty. Stance is normal. Gait demonstrates normal stride length and balance. Able to heel, toe and tandem walk without difficulty.  Reflexes: 1+ and symmetric. Toes downgoing.    NIHSS  *** Modified Rankin  ***    Diagnostic Data (Labs, Imaging, Testing)  CT HEAD WO CONTRAST 10/29/18 IMPRESSION: 1. No evidence of acute intracranial abnormality. 2. ASPECTS is 10.  CT ANGIO HEAD W OR WO CONTRAST CT ANGIO NECK W OR WO CONTRAST 10/30/18 IMPRESSION: 1. Intracranial atherosclerosis predominantly involving the PCAs including proximal left P2 occlusion with intermittent distal reconstitution. 2. Moderate to severe right P2 and  more distal stenoses. 3. Widely patent cervical carotid and vertebral arteries. 4. Partially visualized 20 x 16 mm right lower lobe pulmonary nodule and borderline right hilar lymphadenopathy. Chest CT is recommended for further evaluation. 5.  Aortic Atherosclerosis (ICD10-I70.0).  MR BRAIN WO CONTRAST 10/29/18 IMPRESSION: Mild chronic small vessel disease.  No acute abnormality.  ECHOCARDIOGRAM 10/30/18 IMPRESSIONS  1. The left ventricle has normal systolic function with an ejection fraction of 60-65%. The cavity size was normal. Left ventricular diastolic Doppler parameters are consistent with impaired relaxation.  2. The right ventricle has normal systolic function. The cavity was normal. There is no increase in right ventricular wall thickness.  3. The mitral valve is degenerative. Mild thickening of the mitral valve leaflet. Mild calcification of the mitral valve leaflet.  4. The tricuspid valve is normal in structure.  5. The aortic valve is tricuspid Moderate thickening of the aortic valve Sclerosis without any evidence of stenosis of the aortic valve.  6. The pulmonic valve was normal in structure.  7. The aortic root is  normal in size and structure.    ASSESSMENT: GLENYS SNADER is a 73 y.o. year old female here with left brain TIA on 10/29/18. Vascular risk factors include HTN, HLD and DM.     PLAN:  1. TIA : Continue aspirin 81 mg daily  and lipitor  for secondary stroke prevention. Maintain strict control of hypertension with blood pressure goal below 130/90, diabetes with hemoglobin A1c goal below 6.5% and cholesterol with LDL cholesterol (bad cholesterol) goal below 70 mg/dL.  I also advised the patient to eat a healthy diet with plenty of whole grains, cereals, fruits and vegetables, exercise regularly with at least 30 minutes of continuous activity daily and maintain ideal body weight. 2. HTN: Advised to continue current treatment regimen.  Today's BP ***.  Advised to continue to monitor at home along with continued follow-up with PCP for management 3. HLD: Advised to continue current treatment regimen along with continued follow-up with PCP for future prescribing and monitoring of lipid panel 4. DMII: Advised to continue to monitor glucose levels at home along with continued follow-up with PCP for management and monitoring    Follow up in *** or call earlier if needed   Greater than 50% of time during this 25 minute visit was spent on counseling, explanation of diagnosis of TIA, reviewing risk factor management of HTN, HLD and DM, planning of further management along with potential future management, and discussion with patient and family answering all questions.    Venancio Poisson, AGNP-BC  [facility] [address] [place],  [address]  Phone [phone] Fax [phone] Note: This document was prepared with digital dictation and possible smart phrase technology. Any transcriptional errors that result from this process are unintentional.

## 2018-12-15 ENCOUNTER — Inpatient Hospital Stay: Payer: MEDICARE | Admitting: Adult Health

## 2018-12-15 ENCOUNTER — Encounter: Payer: Self-pay | Admitting: Adult Health

## 2018-12-15 ENCOUNTER — Ambulatory Visit (INDEPENDENT_AMBULATORY_CARE_PROVIDER_SITE_OTHER): Payer: MEDICARE | Admitting: Adult Health

## 2018-12-15 ENCOUNTER — Other Ambulatory Visit: Payer: Self-pay

## 2018-12-15 DIAGNOSIS — E1169 Type 2 diabetes mellitus with other specified complication: Secondary | ICD-10-CM | POA: Diagnosis not present

## 2018-12-15 DIAGNOSIS — G459 Transient cerebral ischemic attack, unspecified: Secondary | ICD-10-CM | POA: Diagnosis not present

## 2018-12-15 DIAGNOSIS — I669 Occlusion and stenosis of unspecified cerebral artery: Secondary | ICD-10-CM

## 2018-12-15 DIAGNOSIS — E785 Hyperlipidemia, unspecified: Secondary | ICD-10-CM

## 2018-12-15 DIAGNOSIS — I672 Cerebral atherosclerosis: Secondary | ICD-10-CM | POA: Diagnosis not present

## 2018-12-15 NOTE — Progress Notes (Signed)
I agree with the above plan 

## 2018-12-15 NOTE — Progress Notes (Signed)
[facility] [address] [place]. [place] [address] [phone]       VIRTUAL VISIT FOLLOW UP NOTE  [name] [place] Date of Birth:  [birth year] Medical Record Number:  [id number]   Reason for Referral:  hospital stroke follow up    Virtual Visit via Video Note  I connected with Bushra Denman Hibler on 12/15/18 at 11:15 AM EDT by a video enabled telemedicine application located remotely in my own home and verified that I am speaking with the correct person using two identifiers who was located at their own home.   I discussed the limitations of evaluation and management by telemedicine and the availability of in person appointments. The patient expressed understanding and agreed to proceed.   CHIEF COMPLAINT:  Chief Complaint  Patient presents with   Follow-up    TIA - doing well    HPI: Beth Macdonald was initially scheduled today for in office hospital follow-up regarding TIA on 10/29/2018 but due to COVID-19 safety precautions, visit transition to telemedicine via WebEx. History obtained from patient and chart review. Reviewed all radiology images and labs personally.  [name] is a 73 y.o. 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 CT head reviewed and negative for acute infarct or abnormality. MRI brain reviewed which was negative for acute infarct or abnormality but did show evidence of small vessel disease. CTA head/neck showed posterior circulation atherosclerosis L>R, L P2 occlusion, mod to severe R P2, RLL pulm nodule and borderline R hilar lymphadenopathy along with aortic atherosclerosis and beading L>R ICA. 2D echo showed EF of 60-65% without cardiac source of embolus identified. Symptoms likely related to left brian TIA. Recommended DAPT x3 weeks then aspirin alone along with continuation of atorvastatin 20mg . HTN and DM stable with A1c 6.6. Other stroke risk factors  include advanced age but no prior hx of stroke/TIA. Recommended outpatient 30 day cardiac monitor to assess for AF as possible cause of TIA.  As all symptoms resolved, she was discharged home in stable condition without therapy needs.   She reports she has been doing well since hospital discharge without recurring or new stroke/TIA symptoms.  She has completed 3 weeks DAPT and continues on aspirin alone without side effects of bleeding or bruising.  Recent lipid panel by PCP showing LDL 102 glycerides 207 therefore increased atorvastatin from 20 mg to 40 mg daily along with initiating Zetia and plans on repeating levels in 02/2019.  Blood pressure has been on the lower side with SBP 100-120 but mainly low 100s.  Amlodipine dosage increase by PCP initially after hospital discharge due to continued elevated BP readings but due to side effect of BLE edema and improvement of BP, dosage decreased and has since been discontinued approximately 1 week ago.  Reports sleeping well at night, only occasional daytime drowsiness and denies snoring at night.  Currently wearing cardiac monitor which apparently was ordered for 14 days and will be completed in 1 week's time to assess for potential atrial fibrillation.  History of concussions in the past but denies stroke.  She does endorse occasional dizzy spells which has been ongoing for numerous years and endorses improvement as of recent with less frequent spells.  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  No further concerns at this time.     ROS:   14 system review of systems performed and negative with exception of dizziness  PMH:  Past Medical History:  Diagnosis Date   Diabetes mellitus without complication (HCC)    Type 2   Facial paralysis/Bells palsy    Hyperlipidemia    Neck pain    Skull fracture (HCC)     PSH:  Past Surgical History:  Procedure Laterality Date   shoulder Bilateral     Social History:  Social History     Socioeconomic History   Marital status: Single    Spouse name: Not on file   Number of children: Not on file   Years of education: Not on file   Highest education level: Not on file  Occupational History   Not on file  Social Needs   Financial resource strain: Not on file   Food insecurity:    Worry: Not on file    Inability: Not on file   Transportation needs:    Medical: Not on file    Non-medical: Not on file  Tobacco Use   Smoking status: Former Smoker    Last attempt to quit: 09/03/1981    Years since quitting: 37.3   Smokeless tobacco: Never Used  Substance and Sexual Activity   Alcohol use: Yes    Comment: social/rarely   Drug use: Not on file   Sexual activity: Yes  Lifestyle   Physical activity:    Days per week: Not on file    Minutes per session: Not on file   Stress: Not on file  Relationships   Social connections:    Talks on phone: Not on file    Gets together: Not on file    Attends religious service: Not on file    Active member of club or organization: Not on file    Attends meetings of clubs or organizations: Not on file    Relationship status: Not on file   Intimate partner violence:    Fear of current or ex partner: Not on file    Emotionally abused: Not on file    Physically abused: Not on file    Forced sexual activity: Not on file  Other Topics Concern   Not on file  Social History Narrative   Not on file    Family History:  Family History  Problem Relation Age of Onset   Hypertension Mother    Hypertension Father     Medications:   Current Outpatient Medications on File Prior to Visit  Medication Sig Dispense Refill   aspirin 81 MG EC tablet Take 1 tablet (81 mg total) by mouth daily. 30 tablet 0   atorvastatin (LIPITOR) 20 MG tablet Take 40 mg by mouth daily.     Ezetimibe (ZETIA PO) Take by mouth.     MAGNESIUM PO Take 1 tablet by mouth daily.     OMEGA-3 FATTY ACIDS PO Take 1 tablet by mouth daily.      vitamin E 1000 UNIT capsule Take 1,000 Units by mouth daily.     No current facility-administered medications on file prior to visit.     Allergies:   Allergies  Allergen Reactions   Tetracyclines & Related Other (See Comments)    "Feel bad"     Physical Exam  *Limited exam due to visit type*  Depression screen [facility] 2/9 12/15/2018  Decreased Interest 0  Down, Depressed, Hopeless 0  PHQ - 2 Score 0     General: well developed, well nourished, pleasant elderly Caucasian female, seated, in no evident distress Head: head normocephalic and atraumatic.    Neurologic  Exam Mental Status: Awake and fully alert. Oriented to place and time. Recent and remote memory intact. Attention span, concentration and fund of knowledge appropriate. Mood and affect appropriate.  Cranial Nerves: Extraocular movements full without nystagmus. Hearing intact to voice.  Chronic right-sided facial paralysis Motor: No evidence of weakness per drift assessment Sensory.: UTA but subjectively denies temperature variation in extremities or numbness/tingling Coordination: Rapid alternating movements normal in all extremities. Finger-to-nose and heel-to-shin performed accurately bilaterally. Gait and Station: Arises from chair without difficulty. Stance is normal. Gait demonstrates normal stride length and balance. Able to heel, toe and tandem walk without difficulty.  Reflexes: UTA   NIHSS  0 Modified Rankin  0    Diagnostic Data (Labs, Imaging, Testing)  CT HEAD WO CONTRAST 10/29/18 IMPRESSION: 1. No evidence of acute intracranial abnormality. 2. ASPECTS is 10.  CT ANGIO HEAD W OR WO CONTRAST CT ANGIO NECK W OR WO CONTRAST 10/30/18 IMPRESSION: 1. Intracranial atherosclerosis predominantly involving the PCAs including proximal left P2 occlusion with intermittent distal reconstitution. 2. Moderate to severe right P2 and more distal stenoses. 3. Widely patent cervical carotid and vertebral  arteries. 4. Partially visualized 20 x 16 mm right lower lobe pulmonary nodule and borderline right hilar lymphadenopathy. Chest CT is recommended for further evaluation. 5.  Aortic Atherosclerosis (ICD10-I70.0).  MR BRAIN WO CONTRAST 10/29/18 IMPRESSION: Mild chronic small vessel disease.  No acute abnormality.  ECHOCARDIOGRAM 10/30/18 IMPRESSIONS  1. The left ventricle has normal systolic function with an ejection fraction of 60-65%. The cavity size was normal. Left ventricular diastolic Doppler parameters are consistent with impaired relaxation.  2. The right ventricle has normal systolic function. The cavity was normal. There is no increase in right ventricular wall thickness.  3. The mitral valve is degenerative. Mild thickening of the mitral valve leaflet. Mild calcification of the mitral valve leaflet.  4. The tricuspid valve is normal in structure.  5. The aortic valve is tricuspid Moderate thickening of the aortic valve Sclerosis without any evidence of stenosis of the aortic valve.  6. The pulmonic valve was normal in structure.  7. The aortic root is normal in size and structure.    ASSESSMENT: CHRISTINNA SPRUNG is a 73 y.o. year old female here with left brain TIA on 10/29/18. 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  She has been stable from a stroke standpoint without new or reoccurring of symptoms.    PLAN:  1. TIA : Continue aspirin 81 mg daily  and lipitor  for secondary stroke prevention. Maintain strict control of hypertension with blood pressure goal below 130/90, diabetes with hemoglobin A1c goal below 6.5% and cholesterol with LDL cholesterol (bad cholesterol) goal below 70 mg/dL.  I also advised the patient to eat a healthy diet with plenty of whole grains, cereals, fruits and vegetables,  exercise regularly with at least 30 minutes of continuous activity daily and maintain ideal body weight.  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 HLD: Advised to continue current treatment regimen along with continued follow-up with PCP for future prescribing and monitoring of lipid panel 3. 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  Advised the importance of managing risk factors such as cholesterol and diabetes along with avoiding hypotension.  Continues to obtain BP readings on the lower side and advised her that it may take some time to get the amlodipine out of her system.  Advised to increase fluid intake along with slightly increasing sodium intake and to follow-up with PCP if she continues to experience hypotension or to proceed to ED with any symptomatic hypotension.  Intermittent episodes of dizziness will continue to be monitored 5. Cardiac monitor will be completed in 1 week's time to assess for potential atrial fibrillation 6. Declines OSA evaluation at this time but will contact office if interested in the future    Follow up in 5 months or call earlier if needed   Greater than 50% of time during this 35 minute non face to face visit was spent on counseling, explanation of diagnosis of TIA, likelihood of recurrence, reviewing risk factor management of HTN, HLD and DM, planning of further management along with potential future management, and discussion with patient and family answering all questions.    Venancio Poisson, AGNP-BC  [facility] [address] [place], [place] [address]  Phone [phone] Fax [phone] Note: This document was prepared with digital dictation and possible smart  phrase technology. Any transcriptional errors that result from this process are unintentional.

## 2018-12-23 ENCOUNTER — Other Ambulatory Visit: Payer: Self-pay

## 2018-12-23 ENCOUNTER — Ambulatory Visit
Admission: RE | Admit: 2018-12-23 | Discharge: 2018-12-23 | Disposition: A | Discharge status: Home / Self Care | Payer: MEDICARE | Source: Ambulatory Visit | Attending: Internal Medicine | Admitting: Internal Medicine

## 2018-12-23 DIAGNOSIS — R911 Solitary pulmonary nodule: Secondary | ICD-10-CM

## 2019-01-07 DIAGNOSIS — G459 Transient cerebral ischemic attack, unspecified: Secondary | ICD-10-CM | POA: Diagnosis not present

## 2019-01-09 DIAGNOSIS — G459 Transient cerebral ischemic attack, unspecified: Secondary | ICD-10-CM | POA: Diagnosis not present

## 2019-01-14 DIAGNOSIS — L259 Unspecified contact dermatitis, unspecified cause: Secondary | ICD-10-CM | POA: Diagnosis not present

## 2019-01-14 DIAGNOSIS — L509 Urticaria, unspecified: Secondary | ICD-10-CM | POA: Diagnosis not present

## 2019-02-02 ENCOUNTER — Ambulatory Visit (INDEPENDENT_AMBULATORY_CARE_PROVIDER_SITE_OTHER): Payer: MEDICARE | Admitting: Cardiology

## 2019-02-02 ENCOUNTER — Other Ambulatory Visit: Payer: Self-pay

## 2019-02-02 ENCOUNTER — Encounter: Payer: Self-pay | Admitting: Cardiology

## 2019-02-02 VITALS — BP 119/62 | HR 75 | Temp 97.5°F | Ht 68.0 in | Wt 183.9 lb

## 2019-02-02 DIAGNOSIS — Z8673 Personal history of transient ischemic attack (TIA), and cerebral infarction without residual deficits: Secondary | ICD-10-CM | POA: Insufficient documentation

## 2019-02-02 DIAGNOSIS — E782 Mixed hyperlipidemia: Secondary | ICD-10-CM | POA: Diagnosis not present

## 2019-02-02 DIAGNOSIS — I471 Supraventricular tachycardia: Secondary | ICD-10-CM

## 2019-02-02 NOTE — Progress Notes (Signed)
Patient referred by [name], MD for evaluation after TIA  Subjective:   Beth Macdonald, female    DOB: 12/15/1945, 73 y.o.   MRN: [id number]   Chief Complaint  Patient presents with  . New Patient (Initial Visit)    SVT, hx of TIA     HPI  73 y.o. caucasian female with PMH sig for T2DM, HLD, presents to establish care and for further evaluation following a TIA that occurred in October 29, 2018 at that time she was placed on a zio patch monitor which detected SVT for 14 beats with a maximum rate of 167.  Her lipitor was increased to 31m daily and she was started on amlodipine 2.541mafter discharge.  She has had little improvement of her LDL and at her last office visit in April she was started on zetia.  She has no history of myalgias on cholesterol lowering therapy.  She has not smoked since 1980.  She checks her bp frequently and is averaging about 115/75.  Her diabetes remains well controlled.   Today she reports she has been doing very well after her TIA.  No further neurological symptoms.  She denies any chest pain, shortness of breath, palpitations or orthopnea.     Past Medical History:  Diagnosis Date  . Diabetes mellitus without complication (HCC)    Type 2  . Facial paralysis/Bells palsy   . Hyperlipidemia   . Neck pain   [facility]    Past Surgical History:  Procedure Laterality Date  . ELBOW SURGERY Bilateral    20 yrs ago  . shoulder Bilateral   . TOENAIL EXCISION Right 2019   Big toe    Social History   Socioeconomic History  . Marital status: Married    Spouse name: Not on file  . Number of children: 0  . Years of education: Not on file  . Highest education level: Not on file  Occupational History  . Not on file  Social Needs  . Financial resource strain: Not on file  . Food insecurity:    Worry: Not on file    Inability: Not on file  . Transportation needs:    Medical: Not on file    Non-medical: Not on file  Tobacco  Use  . Smoking status: Former Smoker    Packs/day: 1.00    Years: 20.00    Pack years: 20.00    Types: Cigarettes    Last attempt to quit: 1980    Years since quitting: 40.4  . Smokeless tobacco: Never Used  Substance and Sexual Activity  . Alcohol use: Yes    Comment: social/rarely  . Drug use: Not on file  . Sexual activity: Yes  Lifestyle  . Physical activity:    Days per week: Not on file    Minutes per session: Not on file  . Stress: Not on file  Relationships  . Social connections:    Talks on phone: Not on file    Gets together: Not on file    Attends religious service: Not on file    Active member of club or organization: Not on file    Attends meetings of clubs or organizations: Not on file    Relationship status: Not on file  . Intimate partner violence:    Fear of current or ex partner: Not on file    Emotionally abused: Not on file    Physically abused: Not on file  Forced sexual activity: Not on file  Other Topics Concern  . Not on file  Social History Narrative  . Not on file    Quit smoking in 1980 teenager smoked 1ppd until then Family History  Problem Relation Age of Onset  . Stroke Mother   . Hypertension Father   . Leukemia Brother   . Stroke Brother    Current Outpatient Medications on File Prior to Visit  Medication Sig Dispense Refill  . aspirin 81 MG EC tablet Take 1 tablet (81 mg total) by mouth daily. 30 tablet 0  . atorvastatin (LIPITOR) 40 MG tablet Take 40 mg by mouth daily.     . cholecalciferol (VITAMIN D3) 25 MCG (1000 UT) tablet Take 1,000 Units by mouth daily.    Marland Kitchen ezetimibe (ZETIA) 10 MG tablet Take by mouth daily.     Marland Kitchen MAGNESIUM PO Take 400 mcg by mouth daily.     . metFORMIN (GLUCOPHAGE) 500 MG tablet INCREASE TO 2 TABLETS TWICE A DAY AS DISCUSSED ORALLY 90 DAYS    . OMEGA-3 FATTY ACIDS PO Take 1,000 mcg by mouth daily.     . predniSONE (DELTASONE) 5 MG tablet daily.     No current facility-administered medications on  file prior to visit.     Cardiovascular studies:  EKG 02/02/2019: Sinus rhythm 65 bpm.  Normal EKG.  ECHO:   1. The left ventricle has normal systolic function with an ejection fraction of 60-65%. The cavity size was normal. Left ventricular diastolic Doppler parameters are consistent with impaired relaxation.  2. The right ventricle has normal systolic function. The cavity was normal. There is no increase in right ventricular wall thickness.  3. The mitral valve is degenerative. Mild thickening of the mitral valve leaflet. Mild calcification of the mitral valve leaflet.  4. The tricuspid valve is normal in structure.  5. The aortic valve is tricuspid Moderate thickening of the aortic valve Sclerosis without any evidence of stenosis of the aortic valve.  6. The pulmonic valve was normal in structure.  7. The aortic root is normal in size and structure.   Zio Patch report: Mostly NSR avg HR 68.  SVT for 14 beats with a maximum rate of 167.  Rare ectopy, No Afib or Flutter  CTA:  IMPRESSION: 1. Intracranial atherosclerosis predominantly involving the PCAs including proximal left P2 occlusion with intermittent distal reconstitution. 2. Moderate to severe right P2 and more distal stenoses. 3. Widely patent cervical carotid and vertebral arteries. 4. Partially visualized 20 x 16 mm right lower lobe pulmonary nodule and borderline right hilar lymphadenopathy. Chest CT is recommended for further evaluation. 5.  Aortic Atherosclerosis (ICD10-I70.0).  Electronically Signed: By: Logan Bores M.D. On: 10/30/2018 10:43  ADDENDUM REPORT: 10/30/2018 11:33  ADDENDUM: Mild beading of the left and possibly right internal carotid arteries which may reflect fibromuscular dysplasia.   Electronically Signed   By: Logan Bores M.D.   On: 10/30/2018 11:33  MRI:  IMPRESSION: Mild chronic small vessel disease.  No acute abnormality.   Electronically Signed   By: Ulyses Jarred  M.D.   On: 10/29/2018 22:43  Recent labs: 12/04/18:  Glucose 107. BUN/Cr: 21/0.8.  eGFR normal.  Rest of CMP normal H/H: 12.6/38.5.  MCV: 93, platelets: 236 Chol 209, TG 187, HDL 65, LDL 107.  Urinalysis: normal  10/28/18 Hgb A1C: 7.1   Review of Systems  Constitution: Negative for diaphoresis and night sweats.  Cardiovascular: Negative for chest pain, claudication, irregular heartbeat, palpitations and  paroxysmal nocturnal dyspnea.  Respiratory: Negative for shortness of breath.   Neurological: Negative for dizziness and focal weakness.  Psychiatric/Behavioral: Negative for altered mental status and substance abuse.        Vitals:   02/02/19 1059  BP: 119/62  Pulse: 75  Temp: (!) 97.5 F (36.4 C)  SpO2: 97%     Body mass index is 27.96 kg/m. Filed Weights   02/02/19 1059  Weight: 183 lb 14.4 oz (83.4 kg)     Objective:   Physical Exam  Constitutional: She appears well-developed and well-nourished.  HENT:  Head: Normocephalic and atraumatic.  Neck: No JVD present.  No carotid bruits  Cardiovascular: Normal rate, regular rhythm and intact distal pulses. Exam reveals no gallop and no friction rub.  Murmur (2/6 systolic murmur best heard over aorta) heard. Pulmonary/Chest: Effort normal and breath sounds normal. She has no wheezes. She has no rales.  Abdominal: Soft. She exhibits no distension.  Musculoskeletal:        General: No edema.  Skin:  Varicose veins bilateral LE's  Psychiatric: She has a normal mood and affect. Her behavior is normal.          Assessment & Recommendations:   SVT: Brief episode seen on zio patch, pt is completely asymptomatic, never has palpitations, denies any chest discomfort.    -pt will let us know if she notices her heart racing in the future and we discussed vagal maneuvers she can perform  TIA: Unknown cause but based on available results seems more related to vascular disease particularly atherosclerotic disease given  presentation with single deficit being an expressive aphasia, along with imaging findings consistent with atherosclerosis and possible FMD which may represent an incidental finding.  All this together makes this less likely to be a cardioembolic phenomena.    -continue aggressive risk factor modification, she will have lipid panel checked by pcp in one week and was instructed to have her PCP send [place] the results -May need to go up on lipitor and/or may need [id number] based on lipid panel result -T2DM well controlled, management per pcp -normotensive  Thank you for referring the patient to [place]. Please feel free to contact with any questions.  Vickki Muff MD PGY-1 Internal Medicine

## 2019-02-19 ENCOUNTER — Other Ambulatory Visit: Payer: Self-pay | Admitting: Internal Medicine

## 2019-02-19 DIAGNOSIS — Z1231 Encounter for screening mammogram for malignant neoplasm of breast: Secondary | ICD-10-CM

## 2019-02-19 DIAGNOSIS — E1165 Type 2 diabetes mellitus with hyperglycemia: Secondary | ICD-10-CM | POA: Diagnosis not present

## 2019-02-19 DIAGNOSIS — E782 Mixed hyperlipidemia: Secondary | ICD-10-CM | POA: Diagnosis not present

## 2019-02-19 DIAGNOSIS — I10 Essential (primary) hypertension: Secondary | ICD-10-CM | POA: Diagnosis not present

## 2019-03-04 DIAGNOSIS — T63481A Toxic effect of venom of other arthropod, accidental (unintentional), initial encounter: Secondary | ICD-10-CM | POA: Diagnosis not present

## 2019-03-04 DIAGNOSIS — L304 Erythema intertrigo: Secondary | ICD-10-CM | POA: Diagnosis not present

## 2019-03-04 DIAGNOSIS — L570 Actinic keratosis: Secondary | ICD-10-CM | POA: Diagnosis not present

## 2019-03-04 DIAGNOSIS — D485 Neoplasm of uncertain behavior of skin: Secondary | ICD-10-CM | POA: Diagnosis not present

## 2019-03-25 ENCOUNTER — Ambulatory Visit
Admission: RE | Admit: 2019-03-25 | Discharge: 2019-03-25 | Disposition: A | Discharge status: Home / Self Care | Payer: MEDICARE | Source: Ambulatory Visit | Attending: Internal Medicine | Admitting: Internal Medicine

## 2019-03-25 ENCOUNTER — Other Ambulatory Visit: Payer: Self-pay

## 2019-03-25 DIAGNOSIS — Z1231 Encounter for screening mammogram for malignant neoplasm of breast: Secondary | ICD-10-CM | POA: Diagnosis not present

## 2019-04-08 ENCOUNTER — Ambulatory Visit: Payer: MEDICARE | Admitting: Cardiology

## 2019-04-13 ENCOUNTER — Telehealth (HOSPITAL_COMMUNITY): Payer: Self-pay | Admitting: Rehabilitation

## 2019-04-13 NOTE — Telephone Encounter (Signed)

## 2019-04-14 ENCOUNTER — Encounter: Payer: Self-pay | Admitting: Vascular Surgery

## 2019-04-14 ENCOUNTER — Other Ambulatory Visit: Payer: Self-pay

## 2019-04-14 ENCOUNTER — Ambulatory Visit (INDEPENDENT_AMBULATORY_CARE_PROVIDER_SITE_OTHER): Payer: MEDICARE | Admitting: Vascular Surgery

## 2019-04-14 VITALS — BP 116/64 | HR 73 | Temp 97.6°F | Resp 20 | Ht 68.0 in | Wt 185.0 lb

## 2019-04-14 DIAGNOSIS — G459 Transient cerebral ischemic attack, unspecified: Secondary | ICD-10-CM | POA: Diagnosis not present

## 2019-04-14 DIAGNOSIS — I83893 Varicose veins of bilateral lower extremities with other complications: Secondary | ICD-10-CM

## 2019-04-14 NOTE — Progress Notes (Signed)
[facility] [place]  Patient name: Beth Macdonald MRN: [id number] DOB: 1945-12-01 Sex: female  REASON FOR CONSULT: Discuss CT finding of possible carotid fibromuscular dysplasia and also discuss venous hypertension  HPI: Beth Macdonald is a 73 y.o. female, who is here today for evaluation.  She had been scheduled to see me to discuss CT findings of fibromuscular dysplasia.  She thinks she is here today to discuss venous pathology in both lower extremities.  She did have an episode in February that was felt to be transient ischemic attack.  She had a code stroke called in the ER and had resolution of her symptoms within 40 minutes.  She had slurred speech and difficulty finding words.  MRI was negative for stroke.  She did have a CT angiogram which suggested possible mild fibromuscular dysplasia in her mid cervical internal carotid artery.  She had complete resolution has had no further difficulty.  She does have a past history of lower extremity venous pathology.  Remote history of laser ablation of bilateral great saphenous veins in another city.  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  This is quite marked in her left leg and she is having some difficulty with soreness and pain associated with this.  She also has a small area of thrombosed varicosity in her right posterior calf and is concerned about this as well  Past Medical History:  Diagnosis Date  . Diabetes mellitus without complication (HCC)    Type 2  . DVT of proximal lower limb ([facility])   . Facial paralysis/Bells palsy   . Hyperlipidemia   . Neck pain   . Skull fracture ([place])   . TIA (transient ischemic attack)     Family History  Problem Relation Age of Onset  . Stroke Mother   . Hypertension Father   . Leukemia Brother   . Stroke Brother     SOCIAL HISTORY: Social History   Socioeconomic History  . Marital status: Married    Spouse  name: Not on file  . Number of children: 0  . Years of education: Not on file  . Highest education level: Not on file  Occupational History  . Not on file  Social Needs  . Financial resource strain: Not on file  . Food insecurity    Worry: Not on file    Inability: Not on file  . Transportation needs    Medical: Not on file    Non-medical: Not on file  Tobacco Use  . Smoking status: Former Smoker    Packs/day: 1.00    Years: 20.00    Pack years: 20.00    Types: Cigarettes    Quit date: 1980    Years since quitting: 40.6  . Smokeless tobacco: Never Used  Substance and Sexual Activity  . Alcohol use: Yes    Comment: social/rarely  . Drug use: Never  . Sexual activity: Yes  Lifestyle  . Physical activity    Days per week: Not on file    Minutes per session: Not on file  . Stress: Not on file  Relationships  . Social Herbalist on phone: Not on file    Gets together: Not on file    Attends religious service: Not on file    Active member of club or organization: Not on file    Attends meetings of clubs or organizations: Not on file    Relationship status: Not on file  .  Intimate partner violence    Fear of current or ex partner: Not on file    Emotionally abused: Not on file    Physically abused: Not on file    Forced sexual activity: Not on file  Other Topics Concern  . Not on file  Social History Narrative  . Not on file    Allergies  Allergen Reactions  . Clindamycin/Lincomycin     Cannot tolerate mycins causes C-Diff  . Tetracyclines & Related Other (See Comments)    "Feel bad"    Current Outpatient Medications  Medication Sig Dispense Refill  . aspirin 81 MG EC tablet Take 1 tablet (81 mg total) by mouth daily. 30 tablet 0  . atorvastatin (LIPITOR) 40 MG tablet Take 40 mg by mouth daily.     . cholecalciferol (VITAMIN D3) 25 MCG (1000 UT) tablet Take 1,000 Units by mouth daily.    . cycloSPORINE (RESTASIS) 0.05 % ophthalmic emulsion 1 drop 2  (two) times daily.    Marland Kitchen ezetimibe (ZETIA) 10 MG tablet Take by mouth daily.     Marland Kitchen MAGNESIUM PO Take 400 mcg by mouth daily.     . metFORMIN (GLUCOPHAGE) 500 MG tablet INCREASE TO 2 TABLETS TWICE A DAY AS DISCUSSED ORALLY 90 DAYS    . OMEGA-3 FATTY ACIDS PO Take 1,000 mcg by mouth daily.      No current facility-administered medications for this visit.     REVIEW OF SYSTEMS:  [X]  denotes positive finding, [ ]  denotes negative finding Cardiac  Comments:  Chest pain or chest pressure:    Shortness of breath upon exertion:    Short of breath when lying flat:    Irregular heart rhythm:        Vascular    Pain in calf, thigh, or hip brought on by ambulation:    Pain in feet at night that wakes you up from your sleep:  x   Blood clot in your veins:    Leg swelling:         Pulmonary    Oxygen at home:    Productive cough:     Wheezing:         Neurologic    Sudden weakness in arms or legs:     Sudden numbness in arms or legs:     Sudden onset of difficulty speaking or slurred speech:    Temporary loss of vision in one eye:     Problems with dizziness:         Gastrointestinal    Blood in stool:     Vomited blood:         Genitourinary    Burning when urinating:     Blood in urine:        Psychiatric    Major depression:         Hematologic    Bleeding problems:    Problems with blood clotting too easily:        Skin    Rashes or ulcers:        Constitutional    Fever or chills:      PHYSICAL EXAM: Vitals:   04/14/19 1400  BP: 116/64  Pulse: 73  Resp: 20  Temp: 97.6 F (36.4 C)  TempSrc: Temporal  SpO2: 100%  Weight: 83.9 kg  Height: 5\' 8"  (1.727 m)    GENERAL: The patient is a well-nourished female, in no acute distress. The vital signs are documented above. CARDIOVASCULAR: 2+ radial and 2+ dorsalis  pedis pulses bilaterally.  Carotid arteries without bruits bilaterally.  She does have extensive varicosities over her anterior left thigh extending over  the lateral left knee and left calf.  Few scattered to the telangiectasia and varicosities in her right calf.  She does have an area approximately 5 mm raised which appears to be a thrombosed superficial varicosity in her mid posterior right calf PULMONARY: There is good air exchange  ABDOMEN: Soft and non-tender  MUSCULOSKELETAL: There are no major deformities or cyanosis. NEUROLOGIC: No focal weakness or paresthesias are detected. SKIN: There are no ulcers or rashes noted. PSYCHIATRIC: The patient has a normal affect.  DATA:  I did review her CT images.  This is a very soft call regarding fibromuscular dysplasia.  Feel that this very well could be technical in the contrast.  Certainly no flow-limiting stenosis if there is any irregularity in her internal carotid artery  I did image her lower extremities with SonoSite.  She did not have a formal venous duplex.  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  Her vein was patent in the left thigh up to mid thigh and then was closed from this more proximally.  She does have a left anterior accessory great saphenous vein that is directly feeding into the large varicosities over her anterior thigh and lateral left knee.  MEDICAL ISSUES: I discussed the significance of her CT with the patient.  I do not see any evidence of significant fibromuscular dysplasia if there is any evidence at all.  I would not recommend any evaluation of this.  Regarding her lower extremity she has had recurrent varicosities after prior treatment with laser ablation.  She will resume use of her thigh-high 20 to 30 mm graduated compression garments.  I do feel that she would benefit from ablation of her anterior accessory great saphenous vein on the left with stab phlebectomy of marked varicosities.  She may also benefit from either sclerotherapy or stab phlebectomy of her right leg varicosities.  We will see her again in 3 months with  formal bilateral venous reflux studies.  We will schedule appointment with Dr. Anice Paganini or Lenon Curt, MD [facility] Tel [phone] Pager [phone]

## 2019-04-15 DIAGNOSIS — Z1212 Encounter for screening for malignant neoplasm of rectum: Secondary | ICD-10-CM | POA: Diagnosis not present

## 2019-04-15 DIAGNOSIS — Z01419 Encounter for gynecological examination (general) (routine) without abnormal findings: Secondary | ICD-10-CM | POA: Diagnosis not present

## 2019-04-15 DIAGNOSIS — E1165 Type 2 diabetes mellitus with hyperglycemia: Secondary | ICD-10-CM | POA: Diagnosis not present

## 2019-04-22 ENCOUNTER — Other Ambulatory Visit: Payer: Self-pay

## 2019-04-22 ENCOUNTER — Ambulatory Visit: Payer: MEDICARE | Admitting: Cardiology

## 2019-04-22 ENCOUNTER — Ambulatory Visit (INDEPENDENT_AMBULATORY_CARE_PROVIDER_SITE_OTHER): Payer: MEDICARE | Admitting: Cardiology

## 2019-04-22 ENCOUNTER — Encounter: Payer: Self-pay | Admitting: Cardiology

## 2019-04-22 VITALS — BP 116/76 | Ht 68.0 in | Wt 184.0 lb

## 2019-04-22 DIAGNOSIS — Z8673 Personal history of transient ischemic attack (TIA), and cerebral infarction without residual deficits: Secondary | ICD-10-CM | POA: Diagnosis not present

## 2019-04-22 DIAGNOSIS — E782 Mixed hyperlipidemia: Secondary | ICD-10-CM

## 2019-04-22 MED ORDER — REPATHA PUSHTRONEX SYSTEM 420 MG/3.5ML ~~LOC~~ SOCT: 420.0000 mg | SUBCUTANEOUS | 12 refills | Status: DC

## 2019-04-22 NOTE — Progress Notes (Signed)
Virtual Visit via Video Note   Subjective:   Beth Macdonald, female    DOB: 12/03/1945, 73 y.o.   MRN: [id number]   I connected with the patient on 04/22/19 by a video enabled telemedicine application and verified that I am speaking with the correct person using two identifiers.     I discussed the limitations of evaluation and management by telemedicine and the availability of in person appointments. The patient expressed understanding and agreed to proceed.   This visit type was conducted due to national recommendations for restrictions regarding the COVID-19 Pandemic (e.g. social distancing).  This format is felt to be most appropriate for this patient at this time.  All issues noted in this document were discussed and addressed.  No physical exam was performed (except for noted visual exam findings with Tele health visits).  The patient has consented to conduct a Tele health visit and understands insurance will be billed.     Chief complaint:  H/o stroke Hyperlipidemia  HPI  73 y/o Caucasian female with hyperlipidemia, controlled hypertension and diabetes, TIA in 10/2018 with workup showing intracranial atherosclerosis, structurally normal heart on echocardiogram and questionable beading in carotids without significant extracranial atherosclerosis.  She as also seen by vascular surgeon Dr. Donnetta Hutching. Dr. Donnetta Hutching also felt that there was only mild beading if any, in her carotids, and this would not amount to fibromuscular dysplasia and did not warrant any further workup. 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   She has been increasing doses of lipitor from 20 mg to 40 mg, along with addition of Zetia 10 mg. Her LDL is marginally improved, although she has had significant improvement in HDL. She denies any new complaints at this time. She is back to performing up to 4 hours of water aerobics every day without symptoms. She asked about  further steps for LDL reduction.    Past Medical History:  Diagnosis Date  . Diabetes mellitus without complication (HCC)    Type 2  . DVT of proximal lower limb ([place])   . Facial paralysis/Bells palsy   . Hyperlipidemia   . Neck pain   . Skull fracture ([place])   . TIA (transient ischemic attack)      Past Surgical History:  Procedure Laterality Date  . ELBOW SURGERY Bilateral    20 yrs ago  . ENDOVENOUS ABLATION SAPHENOUS VEIN W/ LASER Bilateral   . shoulder Bilateral   . TOENAIL EXCISION Right 2019   Big toe     Social History   Socioeconomic History  . Marital status: Married    Spouse name: Not on file  . Number of children: 0  . Years of education: Not on file  . Highest education level: Not on file  Occupational History  . Not on file  Social Needs  . Financial resource strain: Not on file  . Food insecurity    Worry: Not on file    Inability: Not on file  . Transportation needs    Medical: Not on file    Non-medical: Not on file  Tobacco Use  . Smoking status: Former Smoker    Packs/day: 1.00    Years: 20.00    Pack years: 20.00    Types: Cigarettes    Quit date: 1980    Years since quitting: 40.6  . Smokeless tobacco: Never Used  Substance and Sexual Activity  . Alcohol use: Yes    Comment: social/rarely  . Drug use:  Never  . Sexual activity: Yes  Lifestyle  . Physical activity    Days per week: Not on file    Minutes per session: Not on file  . Stress: Not on file  Relationships  . Social Herbalist on phone: Not on file    Gets together: Not on file    Attends religious service: Not on file    Active member of club or organization: Not on file    Attends meetings of clubs or organizations: Not on file    Relationship status: Not on file  . Intimate partner violence    Fear of current or ex partner: Not on file    Emotionally abused: Not on file    Physically abused: Not on file    Forced sexual activity: Not on file  Other  Topics Concern  . Not on file  Social History Narrative  . Not on file     Family History  Problem Relation Age of Onset  . Stroke Mother   . Hypertension Father   . Leukemia Brother   . Stroke Brother      Current Outpatient Medications on File Prior to Visit  Medication Sig Dispense Refill  . aspirin 81 MG EC tablet Take 1 tablet (81 mg total) by mouth daily. 30 tablet 0  . atorvastatin (LIPITOR) 40 MG tablet Take 40 mg by mouth daily.     . cholecalciferol (VITAMIN D3) 25 MCG (1000 UT) tablet Take 1,000 Units by mouth daily.    . cycloSPORINE (RESTASIS) 0.05 % ophthalmic emulsion 1 drop 2 (two) times daily.    Marland Kitchen ezetimibe (ZETIA) 10 MG tablet Take by mouth daily.     Marland Kitchen MAGNESIUM PO Take 400 mcg by mouth daily.     . metFORMIN (GLUCOPHAGE) 500 MG tablet INCREASE TO 2 TABLETS TWICE A DAY AS DISCUSSED ORALLY 90 DAYS    . OMEGA-3 FATTY ACIDS PO Take 1,000 mcg by mouth daily.      No current facility-administered medications on file prior to visit.     Cardiovascular studies:  Echocardiogram 10/30/2018: Normal size LV. EF 60-65%. Abnormal relaxation.  Mild MAC and mitral valve calcification. Moderate thickening of tricuspid valve.  Recent labs:  02/19/2019: Glucose 111. BUN/Cr 21/0.77. eGFR 77. Rest of the CMP normal.  H/H 12/39. MCV 95. Platelets 243. Cool 205. TG 121. HDL 96. LDL 85.   Results for DASHAUNA, HEYMANN (MRN [id number]) as of 04/22/2019 09:35  Ref. Range 10/30/2018 05:36 10/30/2018 16:96  BASIC METABOLIC PANEL Unknown  Rpt (A)  Sodium Latest Ref Range: 135 - 145 mmol/L  142  Potassium Latest Ref Range: 3.5 - 5.1 mmol/L  3.7  Chloride Latest Ref Range: 98 - 111 mmol/L  106  CO2 Latest Ref Range: 22 - 32 mmol/L  26  Glucose Latest Ref Range: 70 - 99 mg/dL  118 (H)  Mean Plasma Glucose Latest Units: mg/dL 142.72   BUN Latest Ref Range: 8 - 23 mg/dL  22  Creatinine Latest Ref Range: 0.44 - 1.00 mg/dL  0.95  Calcium Latest Ref Range: 8.9 - 10.3 mg/dL  9.5   Anion gap Latest Ref Range: 5 - 15   10  GFR, Est Non African American Latest Ref Range: >60 mL/min  59 (L)  GFR, Est African American Latest Ref Range: >60 mL/min  >60  Total CHOL/HDL Ratio Latest Units: RATIO 3.5   Cholesterol Latest Ref Range: 0 - 200 mg/dL 198   HDL Cholesterol  Latest Ref Range: >40 mg/dL 57   LDL (calc) Latest Ref Range: 0 - 99 mg/dL 100 (H)   Triglycerides Latest Ref Range: <150 mg/dL 207 (H)   VLDL Latest Ref Range: 0 - 40 mg/dL 41 (H)    Results for ANETRIA, HARWICK (MRN [id number]) as of 04/22/2019 09:35  Ref. Range 10/29/2018 16:12  WBC Latest Ref Range: 4.0 - 10.5 K/uL 9.4  RBC Latest Ref Range: 3.87 - 5.11 MIL/uL 4.36  Hemoglobin Latest Ref Range: 12.0 - 15.0 g/dL 13.1  HCT Latest Ref Range: 36.0 - 46.0 % 42.2  MCV Latest Ref Range: 80.0 - 100.0 fL 96.8  MCH Latest Ref Range: 26.0 - 34.0 pg 30.0  MCHC Latest Ref Range: 30.0 - 36.0 g/dL 31.0  RDW Latest Ref Range: 11.5 - 15.5 % 12.4  Platelets Latest Ref Range: 150 - 400 K/uL 205  nRBC Latest Ref Range: 0.0 - 0.2 % 0.0   Results for ANELY, SPIEWAK (MRN [id number]) as of 04/22/2019 09:35  Ref. Range 10/30/2018 05:36 10/30/2018 07:43  Glucose Latest Ref Range: 70 - 99 mg/dL  118 (H)  Hemoglobin A1C Latest Ref Range: 4.8 - 5.6 % 6.6 (H)      Review of Systems  Constitution: Negative for decreased appetite, malaise/fatigue, weight gain and weight loss.  HENT: Negative for congestion.   Eyes: Negative for visual disturbance.  Cardiovascular: Negative for chest pain, dyspnea on exertion, leg swelling, palpitations and syncope.  Respiratory: Negative for cough.   Endocrine: Negative for cold intolerance.  Hematologic/Lymphatic: Does not bruise/bleed easily.  Skin: Negative for itching and rash.  Musculoskeletal: Negative for myalgias.  Gastrointestinal: Negative for abdominal pain, nausea and vomiting.  Genitourinary: Negative for dysuria.  Neurological: Negative for dizziness and weakness.   Psychiatric/Behavioral: The patient is not nervous/anxious.   All other systems reviewed and are negative.        Vitals:   04/21/19 1425  BP: 116/76   (Measured by the patient using a home BP monitor)  Body mass index is 27.98 kg/m. Filed Weights   04/21/19 1425  Weight: 184 lb (83.5 kg)     Observation/findings during video visit   Objective:    Physical Exam  Constitutional: She is oriented to person, place, and time. She appears well-developed and well-nourished. No distress.  Pulmonary/Chest: Effort normal.  Neurological: She is alert and oriented to person, place, and time. A cranial nerve deficit (Mild facial droop) is present.  Psychiatric: She has a normal mood and affect.  Nursing note and vitals reviewed.         Assessment & Recommendations:   73 y/o Caucasian female with hyperlipidemia, controlled hypertension and diabetes, TIA in 10/2018 with intracranial atherosclerosis, venous insufficiency.   Hypertension: Well controlled  Hyperlipidemia: H/o stroke. Continue aggressive secondary prevention. She has been on lipitor 40 mg and Zetia 10 mg with minimal reduction in LDL. currently at 28. I discussed with the patient regarding increasing lipitor to 80 mg vs addition of PCSK 9 inhibitors. Given modest improvement in LDL, she would prefer addition of PCSK9 inhibitor. Start Repatha 420 mg every 30 days.  In future, I am hopeful that lipitor dose can be reduced to 20 mg daily.   H/o TIA: Intracranial atherosclerosis. Low suspicion for cardioembolic source.  Secondary prevention as above.  Nurse visit for Repatha administration in 3 weeks. Repeat lipid panel and f/u w/me in 3 months.    Nigel Mormon, MD [facility] Pager: [phone] Office: [phone] If  no answer Cell [phone]

## 2019-05-12 ENCOUNTER — Other Ambulatory Visit: Payer: Self-pay | Admitting: *Deleted

## 2019-05-12 DIAGNOSIS — Z20822 Contact with and (suspected) exposure to covid-19: Secondary | ICD-10-CM

## 2019-05-12 DIAGNOSIS — R6889 Other general symptoms and signs: Secondary | ICD-10-CM | POA: Diagnosis not present

## 2019-05-14 LAB — NOVEL CORONAVIRUS, NAA: SARS-CoV-2, NAA: NOT DETECTED

## 2019-05-15 DIAGNOSIS — Z23 Encounter for immunization: Secondary | ICD-10-CM | POA: Diagnosis not present

## 2019-05-19 ENCOUNTER — Ambulatory Visit: Payer: MEDICARE | Admitting: Adult Health

## 2019-06-11 ENCOUNTER — Telehealth: Payer: Self-pay

## 2019-06-11 NOTE — Telephone Encounter (Signed)
-----   Message from Nigel Mormon, MD sent at 06/11/2019  9:43 AM EDT ----- Regarding: RE: Beth Macdonald, can you follow up about prior auth on this patient and close the loop please?  Thanks MJP  ----- Message ----- From: Rolland Porter Sent: 06/11/2019   9:12 AM EDT To: Nigel Mormon, MD, Cheri Kearns, CMA Subject: RE: Checkout                                   She declined to sch nurse visit at this time. She requested to hear back about the status of her insurance covering the medicine before scheduling any appts. ----- Message ----- From: Nigel Mormon, MD Sent: 06/10/2019   8:00 PM EDT To: Rolland Porter Subject: RE: Checkout                                   I had sent the prescription. She needed a nurse visit for first administration. [name], have you heard anything about her Repatha? I need to understand better how the prior auth process works.Can you follow up on the prescription please? [name], can you schedule a nurse visit for first administration please?  Thanks MJP    ----- Message ----- From: Rolland Porter Sent: 06/10/2019   4:45 PM EDT To: Nigel Mormon, MD Subject: RE: Checkout                                   FYI: I was going through my messages making sure the VV visit were scheduled for follow-ups. I called pt and she stated you spoke with her already [id number]. She also stated she was waiting to hear back from our office re: repatha b/c her insurance refused to pay for it. She declined to sch 65mo ov at this time. She wants to know the status of the repatha issue before scheduling. ----- Message ----- From: Nigel Mormon, MD Sent: 04/22/2019   2:41 PM EDT To: Max Fickle, Rolland Porter, # Subject: Checkout                                       Nurse visit for repatha in 3 weeks Repeat lipid panel in 3 months + office visit with me.  Thanks MJP

## 2019-06-11 NOTE — Telephone Encounter (Signed)
Just did a PA for her repatha awaiting response

## 2019-06-19 ENCOUNTER — Telehealth: Payer: Self-pay

## 2019-06-19 NOTE — Telephone Encounter (Signed)
-----   Message from Nigel Mormon, MD sent at 06/11/2019  9:43 AM EDT ----- Regarding: RE: Mcneil Sober, can you follow up about prior auth on this patient and close the loop please?  Thanks MJP  ----- Message ----- From: Rolland Porter Sent: 06/11/2019   9:12 AM EDT To: Nigel Mormon, MD, Cheri Kearns, CMA Subject: RE: Checkout                                   She declined to sch nurse visit at this time. She requested to hear back about the status of her insurance covering the medicine before scheduling any appts. ----- Message ----- From: Nigel Mormon, MD Sent: 06/10/2019   8:00 PM EDT To: Rolland Porter Subject: RE: Checkout                                   I had sent the prescription. She needed a nurse visit for first administration. [name], have you heard anything about her Repatha? I need to understand better how the prior auth process works.Can you follow up on the prescription please? [name], can you schedule a nurse visit for first administration please?  Thanks MJP    ----- Message ----- From: Rolland Porter Sent: 06/10/2019   4:45 PM EDT To: Nigel Mormon, MD Subject: RE: Checkout                                   FYI: I was going through my messages making sure the VV visit were scheduled for follow-ups. I called pt and she stated you spoke with her already [id number]. She also stated she was waiting to hear back from our office re: repatha b/c her insurance refused to pay for it. She declined to sch 80mo ov at this time. She wants to know the status of the repatha issue before scheduling. ----- Message ----- From: Nigel Mormon, MD Sent: 04/22/2019   2:41 PM EDT To: Max Fickle, Rolland Porter, # Subject: Checkout                                       Nurse visit for repatha in 3 weeks Repeat lipid panel in 3 months + office visit with me.  Thanks MJP

## 2019-06-19 NOTE — Telephone Encounter (Signed)
Working on it.

## 2019-07-09 ENCOUNTER — Other Ambulatory Visit: Payer: Self-pay

## 2019-07-09 DIAGNOSIS — I83893 Varicose veins of bilateral lower extremities with other complications: Secondary | ICD-10-CM

## 2019-07-15 ENCOUNTER — Telehealth (HOSPITAL_COMMUNITY): Payer: Self-pay | Admitting: *Deleted

## 2019-07-15 NOTE — Telephone Encounter (Signed)

## 2019-07-16 ENCOUNTER — Encounter (HOSPITAL_COMMUNITY): Payer: MEDICARE

## 2019-07-16 ENCOUNTER — Ambulatory Visit: Payer: MEDICARE | Admitting: Vascular Surgery

## 2019-08-17 IMAGING — MR MR HEAD W/O CM
9 of 11 series · 33 of 48 positions shown · non-contrast
Comparison: Head CT 10/29/2018

CLINICAL DATA: Slurred speech and dizziness

EXAM:
MRI HEAD WITHOUT CONTRAST
TECHNIQUE: Multiplanar, multiecho pulse sequences of the brain and surrounding
structures were obtained without intravenous contrast.

[Series 3: DWI · axial · 3.0mm · 0.94mm/px · z∈[-55,+83]mm · 8 of 94 slices shown (1 of 2)]
[im 1/94]
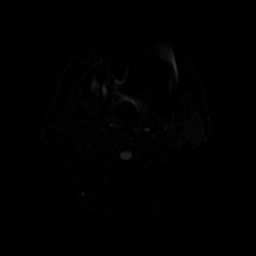
[im 14/94]
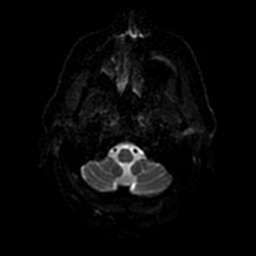
[im 27/94]
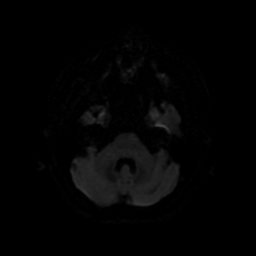
[im 40/94]
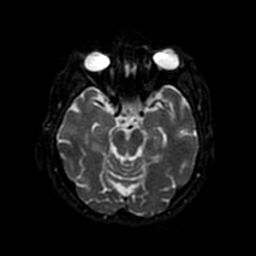
[im 54/94]
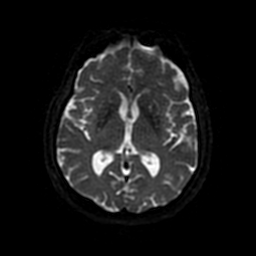
[im 67/94]
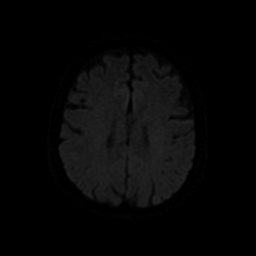
[im 80/94]
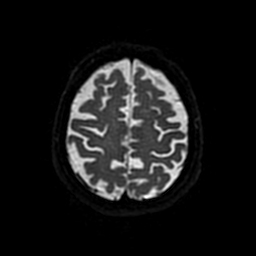
[im 94/94]
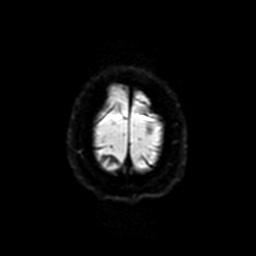

[Series 4: FLAIR · axial · 3.0mm · 0.47mm/px · z∈[-55,+83]mm · 2 of 24 slices shown (1 of 2)]
[im 1/24]
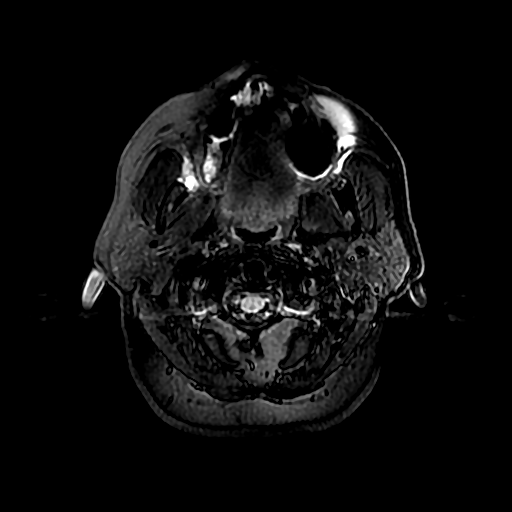
[im 24/24]
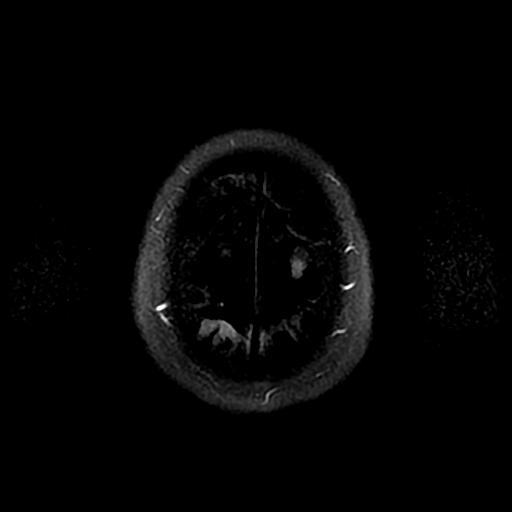

[Series 5: (person_name) · axial · 3.0mm · 0.47mm/px · z∈[-56,+25]mm · 5 of 96 slices shown]
[im 1/96]
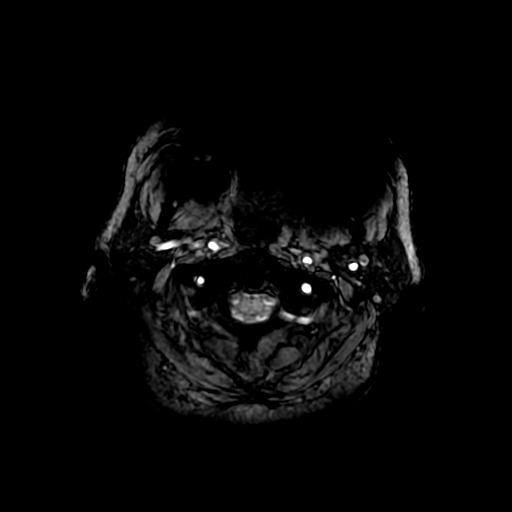
[im 14/96]
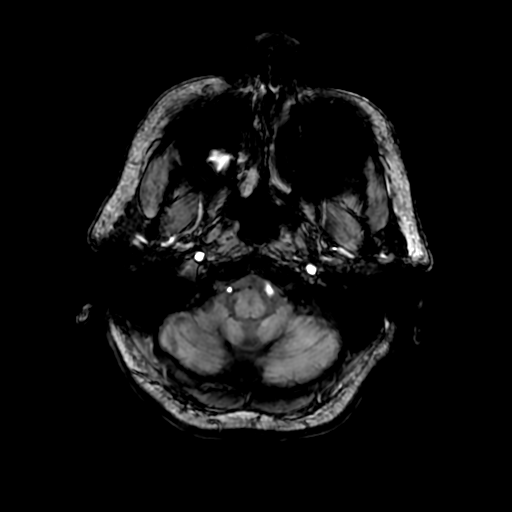
[im 28/96]
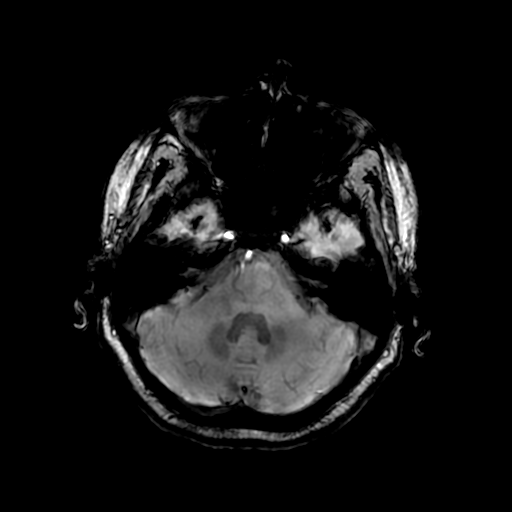
[im 41/96]
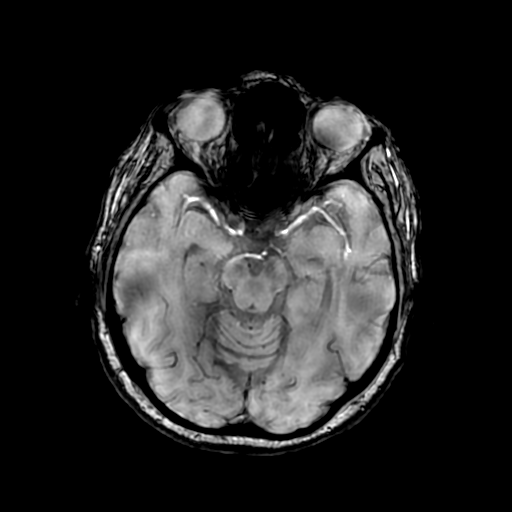
[im 55/96]
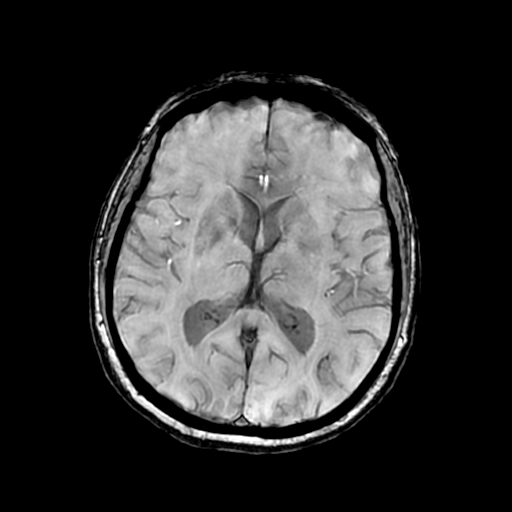

[Series 6: T2 · axial · 5.0mm · 0.47mm/px · z∈[-55,+83]mm · 2 of 24 slices shown (1 of 2)]
[im 1/24]
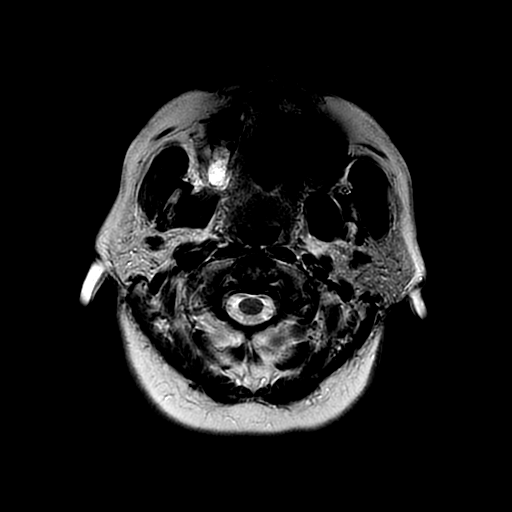
[im 24/24]
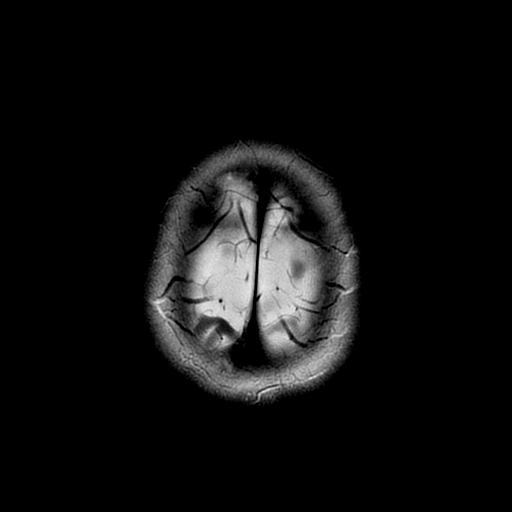

[Series 7: DWI · coronal · 4.0mm · 0.94mm/px · 5 of 66 slices shown (2 of 2)]
[im 1/66]
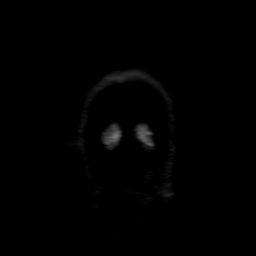
[im 17/66]
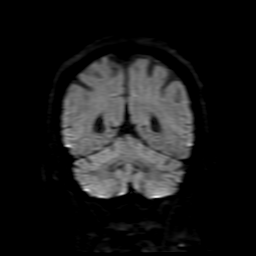
[im 33/66]
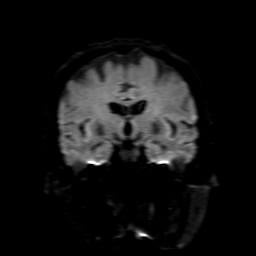
[im 49/66]
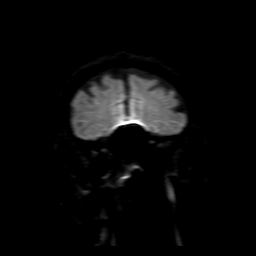
[im 66/66]
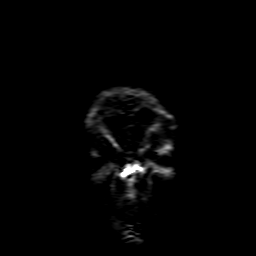

[Series 8: FLAIR · sagittal · 5.0mm · 0.47mm/px · 2 of 23 slices shown (2 of 2)]
[im 1/23]
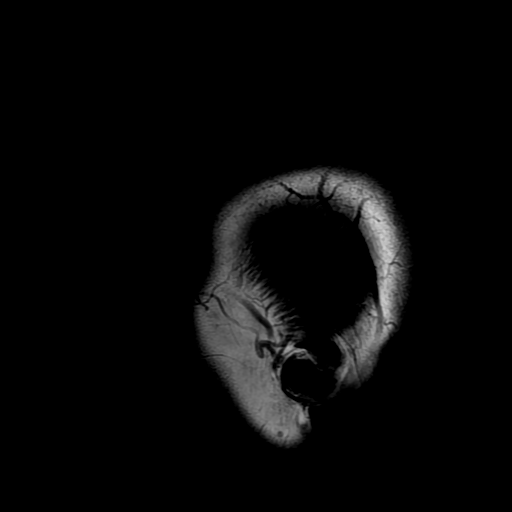
[im 23/23]
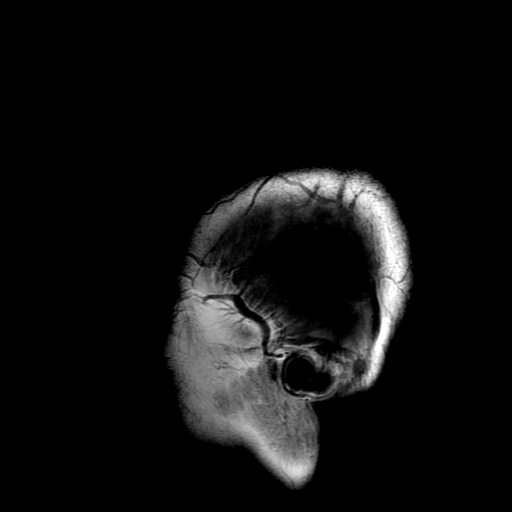

[Series 10: T2 · coronal · 5.0mm · 0.43mm/px · 2 of 28 slices shown (2 of 2)]
[im 1/28]
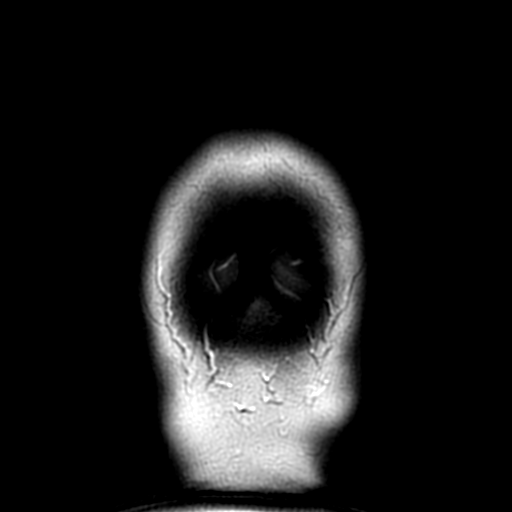
[im 28/28]
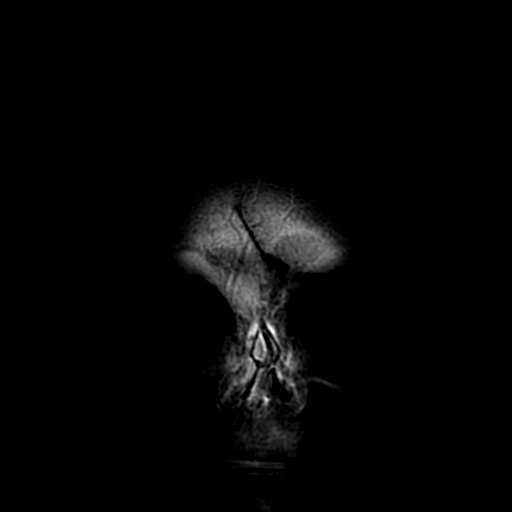

[Series 350: ADC · axial · 3.0mm · 0.94mm/px · z∈[-55,+83]mm · 4 of 46 slices shown (1 of 2)]
[im 1/46]
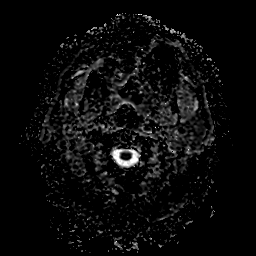
[im 16/46]
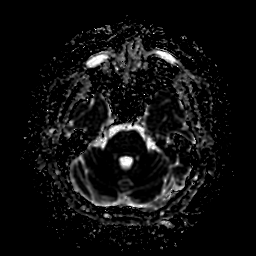
[im 31/46]
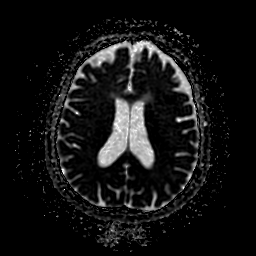
[im 46/46]
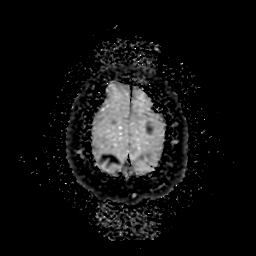

[Series 750: ADC · coronal · 4.0mm · 0.94mm/px · 3 of 33 slices shown (2 of 2)]
[im 1/33]
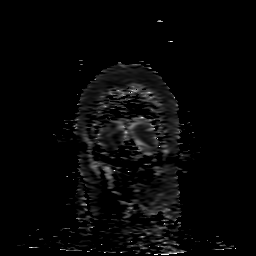
[im 17/33]
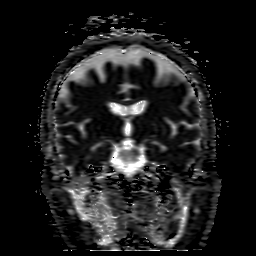
[im 33/33]
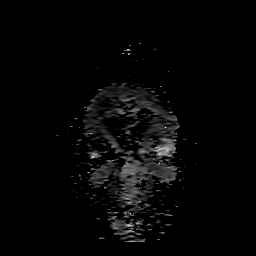

[33 of 48 positions shown; findings below may reference images not displayed]

FINDINGS: BRAIN: There is no acute infarct, acute hemorrhage, hydrocephalus or
extra-axial collection. The midline structures are normal. No
midline shift or other mass effect. Multifocal white matter
hyperintensity, most commonly due to chronic ischemic
microangiopathy. Generalized atrophy without lobar predilection.
Susceptibility-sensitive sequences show no chronic microhemorrhage
or superficial siderosis.

VASCULAR: Major intracranial arterial and venous sinus flow voids
are normal.

SKULL AND UPPER CERVICAL SPINE: Calvarial bone marrow signal is
normal. There is no skull base mass. Visualized upper cervical spine
and soft tissues are normal.

SINUSES/ORBITS: No fluid levels or advanced mucosal thickening. No
mastoid or middle ear effusion. The orbits are normal.
IMPRESSION: Mild chronic small vessel disease.  No acute abnormality.

## 2019-09-10 ENCOUNTER — Encounter: Payer: Self-pay | Admitting: Vascular Surgery

## 2019-09-10 ENCOUNTER — Other Ambulatory Visit: Payer: Self-pay

## 2019-09-10 ENCOUNTER — Ambulatory Visit (HOSPITAL_COMMUNITY)
Admission: RE | Admit: 2019-09-10 | Discharge: 2019-09-10 | Disposition: A | Discharge status: Home / Self Care | Payer: MEDICARE | Source: Ambulatory Visit | Attending: Vascular Surgery | Admitting: Vascular Surgery

## 2019-09-10 ENCOUNTER — Other Ambulatory Visit: Payer: Self-pay | Admitting: *Deleted

## 2019-09-10 ENCOUNTER — Ambulatory Visit (INDEPENDENT_AMBULATORY_CARE_PROVIDER_SITE_OTHER): Payer: MEDICARE | Admitting: Vascular Surgery

## 2019-09-10 VITALS — BP 155/76 | HR 78 | Temp 97.3°F | Resp 16 | Ht 68.0 in | Wt 187.0 lb

## 2019-09-10 DIAGNOSIS — I83812 Varicose veins of left lower extremities with pain: Secondary | ICD-10-CM

## 2019-09-10 DIAGNOSIS — I83893 Varicose veins of bilateral lower extremities with other complications: Secondary | ICD-10-CM | POA: Diagnosis not present

## 2019-09-10 DIAGNOSIS — I83813 Varicose veins of bilateral lower extremities with pain: Secondary | ICD-10-CM

## 2019-09-10 NOTE — Progress Notes (Signed)
Patient name: KOEY Macdonald MRN: [id number] DOB: [birth year] Sex: female  REASON FOR VISIT:   Follow-up of chronic venous insufficiency  HPI:   Beth Macdonald is a pleasant 74 y.o. female who was seen by Dr. Sherren Mocha Early on 04/14/2019.  She was noted to have venous hypertension.  She has a remote history of laser ablation of bilateral great saphenous veins in a different city.  She was having worsening varicosities in both legs.  The symptoms were more significant on the left side.  She also had a small thrombosed varicosity in the right posterior calf.  On the right side her great saphenous vein has been successfully closed.  In the left thigh the vein was closed in the proximal thigh.  She did have an left anterior sensory great saphenous vein that was feeding the large cluster varicose veins over her anterior thigh and lateral left knee.  She was placed in thigh-high compression stockings with a gradient of 20-30.  Dr. Donnetta Hutching felt that she would benefit from laser ablation of the anterior accessory saphenous vein on the left with stab phlebectomies.  She comes in for 73-month follow-up visit.  She continues to have significant aching pain and heaviness especially in the left lower extremity.  The symptoms are aggravated by standing and relieved somewhat with elevation.  She has been wearing her thigh-high compression stockings with some relief.  She does not like to take ibuprofen.  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  She is unaware of any previous history of DVT or phlebitis.  She does have a family history of varicose veins.  Past Medical History:  Diagnosis Date  . Diabetes mellitus without complication (HCC)    Type 2  . DVT of proximal lower limb ([place])   . Facial paralysis/Bells palsy   . Hyperlipidemia   . Neck pain   . Skull fracture ([place])   . TIA (transient ischemic attack)     Family History  Problem Relation Age of Onset  . Stroke  Mother   . Hypertension Father   . Leukemia Brother   . Stroke Brother     SOCIAL HISTORY: Social History   Tobacco Use  . Smoking status: Former Smoker    Packs/day: 1.00    Years: 20.00    Pack years: 20.00    Types: Cigarettes    Quit date: 1980    Years since quitting: 41.0  . Smokeless tobacco: Never Used  Substance Use Topics  . Alcohol use: Yes    Comment: social/rarely    Allergies  Allergen Reactions  . Clindamycin/Lincomycin     Cannot tolerate mycins causes C-Diff  . Tetracyclines & Related Other (See Comments)    "Feel bad"    Current Outpatient Medications  Medication Sig Dispense Refill  . aspirin 81 MG EC tablet Take 1 tablet (81 mg total) by mouth daily. 30 tablet 0  . atorvastatin (LIPITOR) 40 MG tablet Take 40 mg by mouth daily.     . cholecalciferol (VITAMIN D3) 25 MCG (1000 UT) tablet Take 1,000 Units by mouth daily.    . cycloSPORINE (RESTASIS) 0.05 % ophthalmic emulsion 1 drop 2 (two) times daily.    Marland Kitchen ezetimibe (ZETIA) 10 MG tablet Take by mouth daily.     Marland Kitchen MAGNESIUM PO Take 400 mcg by mouth daily.     . metFORMIN (GLUCOPHAGE) 500 MG tablet INCREASE TO 2 TABLETS TWICE A DAY AS DISCUSSED ORALLY 90 DAYS    .  OMEGA-3 FATTY ACIDS PO Take 1,000 mcg by mouth daily.     . Evolocumab with Infusor ([place]) 420 MG/3.5ML SOCT Inject 420 mg into the skin every 30 (thirty) days. 3.6 mL 12   No current facility-administered medications for this visit.    REVIEW OF SYSTEMS:  [X]  denotes positive finding, [ ]  denotes negative finding Cardiac  Comments:  Chest pain or chest pressure:    Shortness of breath upon exertion:    Short of breath when lying flat:    Irregular heart rhythm:        Vascular    Pain in calf, thigh, or hip brought on by ambulation:    Pain in feet at night that wakes you up from your sleep:     Blood clot in your veins:    Leg swelling:  x       Pulmonary    Oxygen at home:    Productive cough:       Wheezing:         Neurologic    Sudden weakness in arms or legs:     Sudden numbness in arms or legs:     Sudden onset of difficulty speaking or slurred speech:    Temporary loss of vision in one eye:     Problems with dizziness:         Gastrointestinal    Blood in stool:     Vomited blood:         Genitourinary    Burning when urinating:     Blood in urine:        Psychiatric    Major depression:         Hematologic    Bleeding problems:    Problems with blood clotting too easily:        Skin    Rashes or ulcers:        Constitutional    Fever or chills:     PHYSICAL EXAM:   Vitals:   09/10/19 1333  BP: (!) 155/76  Pulse: 78  Resp: 16  Temp: (!) 97.3 F (36.3 C)  TempSrc: Temporal  SpO2: 98%  Weight: 187 lb (84.8 kg)  Height: 5\' 8"  (1.727 m)    GENERAL: The patient is a well-nourished female, in no acute distress. The vital signs are documented above. CARDIAC: There is a regular rate and rhythm.  VASCULAR: I do not detect carotid bruits. Both feet are warm and well perfused. LEFT LOWER EXTREMITY: She has a large cluster varicose veins along her anterior lateral thigh and leg.  This is fed by an anterior sensory saphenous vein. I looked at her anterior sensory saphenous vein myself with the SonoSite and this is markedly dilated in the proximal thigh.  I think this could be ablated from the proximal third of the thigh to the saphenofemoral junction.   RIGHT LOWER EXTREMITY: She does have some small varicose veins in the right calf.    PULMONARY: There is good air exchange bilaterally without wheezing or rales. ABDOMEN: Soft and non-tender with normal pitched bowel sounds.  MUSCULOSKELETAL: There are no major deformities or cyanosis. NEUROLOGIC: No focal weakness or paresthesias are detected. SKIN: There are no ulcers or rashes noted. PSYCHIATRIC: The patient has a normal affect.  DATA:    VENOUS DUPLEX: I have independently interpreted her venous  duplex scan today.  On the right side there is no evidence of DVT.  There is deep venous reflux involving the common  femoral vein.  There is some reflux in the right saphenofemoral junction and proximal thigh.  The vein is not especially dilated and is tortuous.  On the left side there is no evidence of DVT.  There is deep venous reflux involving the common femoral vein.  There is superficial venous reflux in the left thigh and the vein is significantly dilated.  MEDICAL ISSUES:   CHRONIC VENOUS INSUFFICIENCY: This patient has persistent aching and heaviness in the left lower extremity.  She has failed conservative treatment including leg elevation and compression stockings.  I think she would be a good candidate for laser ablation of the anterior accessory saphenous vein on the left with greater than 20 stab phlebectomies.  I have discussed the indications for endovenous laser ablation of the left anterior accessory SV, that is to lower the pressure in the veins and potentially help relieve the symptoms from venous hypertension. I have also discussed alternative options including conservative treatment with leg elevation, compression therapy, exercise, avoiding prolonged sitting and standing, and weight management. I have discussed the potential complications of the procedure, including, but not limited to: bleeding, bruising, leg swelling, nerve injury, skin burns, significant pain from phlebitis, deep venous thrombosis, or failure of the vein to close.  I have also explained that venous insufficiency is a chronic disease, and that the patient is at risk for recurrent varicose veins in the future.  All of the patient's questions were encouraged and answered. They are agreeable to proceed.   I have discussed with the patient the indications for stab phlebectomy.  I have explained to the patient that that will have small scars from the stab incisions.  I explained that the other risks include leg  swelling, bruising, bleeding, and phlebitis.  All the patient's questions were encouraged and answered and they are agreeable to proceed.  I have encouraged her to continue to elevate her legs at least daily.  We have discussed the proper positioning for this.  She will continue to wear her compression stockings.  I have encouraged her to avoid prolonged sitting and standing.  We discussed the importance of exercise specifically walking and water aerobics.  We will schedule her procedure in the near future.  Deitra Mayo [facility] [phone]

## 2019-09-24 ENCOUNTER — Other Ambulatory Visit: Payer: MEDICARE | Admitting: Vascular Surgery

## 2019-09-29 DIAGNOSIS — E119 Type 2 diabetes mellitus without complications: Secondary | ICD-10-CM | POA: Diagnosis not present

## 2019-09-29 DIAGNOSIS — H43813 Vitreous degeneration, bilateral: Secondary | ICD-10-CM | POA: Diagnosis not present

## 2019-09-29 DIAGNOSIS — H52203 Unspecified astigmatism, bilateral: Secondary | ICD-10-CM | POA: Diagnosis not present

## 2019-09-29 DIAGNOSIS — H04123 Dry eye syndrome of bilateral lacrimal glands: Secondary | ICD-10-CM | POA: Diagnosis not present

## 2019-09-30 DIAGNOSIS — M1991 Primary osteoarthritis, unspecified site: Secondary | ICD-10-CM | POA: Diagnosis not present

## 2019-09-30 DIAGNOSIS — M25551 Pain in right hip: Secondary | ICD-10-CM | POA: Diagnosis not present

## 2019-09-30 DIAGNOSIS — M545 Low back pain: Secondary | ICD-10-CM | POA: Diagnosis not present

## 2019-10-01 ENCOUNTER — Ambulatory Visit (HOSPITAL_COMMUNITY): Payer: MEDICARE

## 2019-10-01 ENCOUNTER — Ambulatory Visit: Payer: MEDICARE | Admitting: Vascular Surgery

## 2019-10-03 ENCOUNTER — Ambulatory Visit: Payer: MEDICARE

## 2019-10-07 DIAGNOSIS — M1991 Primary osteoarthritis, unspecified site: Secondary | ICD-10-CM | POA: Diagnosis not present

## 2019-10-07 DIAGNOSIS — M545 Low back pain: Secondary | ICD-10-CM | POA: Diagnosis not present

## 2019-10-07 DIAGNOSIS — M25551 Pain in right hip: Secondary | ICD-10-CM | POA: Diagnosis not present

## 2019-10-08 ENCOUNTER — Ambulatory Visit: Payer: Medicare Other | Attending: Internal Medicine

## 2019-10-08 DIAGNOSIS — Z23 Encounter for immunization: Secondary | ICD-10-CM

## 2019-10-08 NOTE — Progress Notes (Signed)
   Covid-19 Vaccination Clinic  Name:  Beth Macdonald    MRN: [id number] DOB: March 11, 1946  10/08/2019  [name] was observed post Covid-19 immunization for 15 minutes without incidence. She was provided with Vaccine Information Sheet and instruction to access the V-Safe system.   [name] was instructed to call 911 with any severe reactions post vaccine: Marland Kitchen Difficulty breathing  . Swelling of your face and throat  . A fast heartbeat  . A bad rash all over your body  . Dizziness and weakness    Immunizations Administered    Name Date Dose VIS Date Route   Pfizer COVID-19 Vaccine 10/08/2019  9:48 AM 0.3 mL 08/14/2019 Intramuscular   Manufacturer: [place]   Lot: [id number]   [place]: [id number]

## 2019-10-14 DIAGNOSIS — M25551 Pain in right hip: Secondary | ICD-10-CM | POA: Diagnosis not present

## 2019-10-14 DIAGNOSIS — M545 Low back pain: Secondary | ICD-10-CM | POA: Diagnosis not present

## 2019-10-14 DIAGNOSIS — M1991 Primary osteoarthritis, unspecified site: Secondary | ICD-10-CM | POA: Diagnosis not present

## 2019-10-24 ENCOUNTER — Ambulatory Visit: Payer: MEDICARE

## 2019-11-02 ENCOUNTER — Ambulatory Visit: Payer: Medicare Other | Attending: Internal Medicine

## 2019-11-02 DIAGNOSIS — Z23 Encounter for immunization: Secondary | ICD-10-CM | POA: Insufficient documentation

## 2019-11-02 NOTE — Progress Notes (Signed)
   Covid-19 Vaccination Clinic  Name:  EBONNIE STAHELI    MRN: [id number] DOB: Jan 13, 1946  11/02/2019  Ms. Markel was observed post Covid-19 immunization for 15 minutes without incidence. She was provided with Vaccine Information Sheet and instruction to access the V-Safe system.   Ms. Muirhead was instructed to call 911 with any severe reactions post vaccine: Marland Kitchen Difficulty breathing  . Swelling of your face and throat  . A fast heartbeat  . A bad rash all over your body  . Dizziness and weakness    Immunizations Administered    Name Date Dose VIS Date Route   Pfizer COVID-19 Vaccine 11/02/2019 11:43 AM 0.3 mL 08/14/2019 Intramuscular   Manufacturer: [place]   Lot: [id number]   [place]: [id number]

## 2019-11-09 ENCOUNTER — Other Ambulatory Visit: Payer: Self-pay | Admitting: *Deleted

## 2019-11-09 DIAGNOSIS — I83812 Varicose veins of left lower extremities with pain: Secondary | ICD-10-CM

## 2019-11-11 DIAGNOSIS — M545 Low back pain: Secondary | ICD-10-CM | POA: Diagnosis not present

## 2019-11-11 DIAGNOSIS — M1991 Primary osteoarthritis, unspecified site: Secondary | ICD-10-CM | POA: Diagnosis not present

## 2019-11-11 DIAGNOSIS — M25551 Pain in right hip: Secondary | ICD-10-CM | POA: Diagnosis not present

## 2019-11-12 DIAGNOSIS — S61451A Open bite of right hand, initial encounter: Secondary | ICD-10-CM | POA: Diagnosis not present

## 2019-11-12 DIAGNOSIS — W5501XA Bitten by cat, initial encounter: Secondary | ICD-10-CM | POA: Diagnosis not present

## 2019-11-18 DIAGNOSIS — R109 Unspecified abdominal pain: Secondary | ICD-10-CM | POA: Diagnosis not present

## 2019-11-18 DIAGNOSIS — W5501XD Bitten by cat, subsequent encounter: Secondary | ICD-10-CM | POA: Diagnosis not present

## 2019-11-19 ENCOUNTER — Encounter: Payer: Self-pay | Admitting: Vascular Surgery

## 2019-11-19 ENCOUNTER — Other Ambulatory Visit: Payer: Self-pay

## 2019-11-19 ENCOUNTER — Ambulatory Visit (INDEPENDENT_AMBULATORY_CARE_PROVIDER_SITE_OTHER): Payer: MEDICARE | Admitting: Vascular Surgery

## 2019-11-19 VITALS — BP 131/75 | HR 76 | Temp 97.4°F | Resp 18 | Ht 68.0 in | Wt 179.0 lb

## 2019-11-19 DIAGNOSIS — I8393 Asymptomatic varicose veins of bilateral lower extremities: Secondary | ICD-10-CM

## 2019-11-19 DIAGNOSIS — I83812 Varicose veins of left lower extremities with pain: Secondary | ICD-10-CM

## 2019-11-19 HISTORY — PX: ENDOVENOUS ABLATION SAPHENOUS VEIN W/ LASER: SUR449

## 2019-11-19 NOTE — Progress Notes (Signed)
Patient name: Beth Macdonald MRN: [id number] DOB: [birth year] Sex: female  REASON FOR VISIT: For endovenous laser ablation of the left anterior accessory saphenous vein and stab phlebectomies  HPI: Beth Macdonald is a 74 y.o. female who was originally seen back in August 2020 with venous hypertension.  She had a remote history of laser ablation of bilateral great saphenous veins elsewhere.  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  She had an episode of phlebitis in the right posterior calf.  She was noted to have a left anterior accessory saphenous vein with significant reflux which was significantly dilated and feeding a large cluster varicose veins over the anterior lateral thigh and left knee.  She had failed conservative treatment and I felt that she was a good candidate for laser ablation of the anterior accessory left saphenous vein with stab phlebectomies.  Current Outpatient Medications  Medication Sig Dispense Refill  . aspirin 81 MG EC tablet Take 1 tablet (81 mg total) by mouth daily. 30 tablet 0  . atorvastatin (LIPITOR) 40 MG tablet Take 40 mg by mouth daily.     . cholecalciferol (VITAMIN D3) 25 MCG (1000 UT) tablet Take 1,000 Units by mouth daily.    . cycloSPORINE (RESTASIS) 0.05 % ophthalmic emulsion 1 drop 2 (two) times daily.    . Evolocumab with Infusor ([place]) 420 MG/3.5ML SOCT Inject 420 mg into the skin every 30 (thirty) days. 3.6 mL 12  . ezetimibe (ZETIA) 10 MG tablet Take by mouth daily.     Marland Kitchen MAGNESIUM PO Take 400 mcg by mouth daily.     . metFORMIN (GLUCOPHAGE) 500 MG tablet INCREASE TO 2 TABLETS TWICE A DAY AS DISCUSSED ORALLY 90 DAYS    . OMEGA-3 FATTY ACIDS PO Take 1,000 mcg by mouth daily.      No current facility-administered medications for this visit.    PHYSICAL EXAM: Vitals:   11/19/19 0906  BP: 131/75  Pulse: 76  Resp: 18  Temp: (!) 97.4 F (36.3 C)  TempSrc: Temporal  SpO2: 98%  Weight: 179  lb (81.2 kg)  Height: 5\' 8"  (1.727 m)    PROCEDURE: LASER ABLATION ANTERIOR ACCESSORY LEFT SAPHENOUS VEIN WITH GREATER THAN 20 STAB PHLEBECTOMIES  TECHNIQUE: The patient was taken to the exam room and the dilated veins in the left leg were marked with the patient standing.  The patient was then placed supine.  I looked at the anterior sensory saphenous vein on the left with the SonoSite and it look like we could cannulate this in the proximal third of the thigh.  The left leg was prepped and draped in usual sterile fashion.  Under ultrasound guidance, after the skin was anesthetized, I cannulated the left anterior accessory saphenous vein with a micropuncture needle and a micropuncture sheath was introduced over wire.  The J-wire was then advanced to the saphenofemoral junction and then the 45 cm sheath advanced over the wire and the dilator and wire removed.  I positioned the end of the sheath approximately 2-1/2 cm distal to the saphenofemoral junction.  The laser fiber was positioned at the end of the sheath and then the sheath retracted and secured.  Tumescent anesthesia was then administered circumferentially around the vein.  Again checked the position of the laser fiber which was approximately 2-1/2 cm distal to the saphenofemoral junction.  The laser ablation was performed from the 2 and half centimeters distal to the saphenofemoral junction to just above  the takeoff of the large tributary in the proximal third of the thigh.  This was 16 cm in length.  680 J of energy were used.  Next tumescent anesthesia was administered at all the marked areas.  Using approximately 25-30 stab incisions over these marked areas the vein was retracted above the skin and then removed using blunt dissection using hemostats.  Pressure was held for hemostasis.  Sterile dressing was applied.  Patient tolerated the procedure well.  She has been scheduled for follow-up duplex in 1 week.  We have reviewed her postoperative  instructions with her.  Deitra Mayo [facility] [place] [phone]

## 2019-11-19 NOTE — Progress Notes (Signed)
     Laser Ablation Procedure    Date: 11/19/2019   Beth Macdonald DOB:1946-06-26  Consent signed: Yes    Surgeon: Gae Gallop MD  Procedure: Laser Ablation: left Greater Saphenous Vein (anterior accessory branch)   BP 131/75 (BP Location: Left Arm, Patient Position: Sitting, Cuff Size: Normal)   Pulse 76   Temp (!) 97.4 F (36.3 C) (Temporal)   Resp 18   Ht 5\' 8"  (1.727 m)   Wt 179 lb (81.2 kg)   SpO2 98%   BMI 27.22 kg/m   Tumescent Anesthesia: 800 cc 0.9% NaCl with 50 cc Lidocaine HCL 1%  and 15 cc 8.4% NaHCO3  Local Anesthesia: 8 cc Lidocaine HCL and NaHCO3 (ratio 2:1)  7 watts continuous mode        Total energy: 680 Joules   Total time: 97 seconds                                     Treatment  Length 16 cm    Stab Phlebectomy: >20 Sites: Thigh and Calf  Patient tolerated procedure well  Notes: Patient wore face mask.  All staff members wore facial masks and facial shields/goggles.    Description of Procedure:  After marking the course of the secondary varicosities, the patient was placed on the operating table in the supine position, and the left leg was prepped and draped in sterile fashion.   Local anesthetic was administered and under ultrasound guidance the saphenous vein was accessed with a micro needle and guide wire; then the mirco puncture sheath was placed.  A guide wire was inserted saphenofemoral junction , followed by a 5 french sheath.  The position of the sheath and then the laser fiber below the junction was confirmed using the ultrasound.  Tumescent anesthesia was administered along the course of the saphenous vein using ultrasound guidance. The patient was placed in Trendelenburg position and protective laser glasses were placed on patient and staff, and the laser was fired at 7 watts continuous mode  for a total of 680 joules.   For stab phlebectomies, local anesthetic was administered at the previously marked varicosities, and tumescent  anesthesia was administered around the vessels.  Greater than 20 stab wounds were made using the tip of an 11 blade. And using the vein hook, the phlebectomies were performed using a hemostat to avulse the varicosities.  Adequate hemostasis was achieved.     Steri strips were applied to the stab wounds and ABD pads and thigh high compression stockings were applied.  Ace wrap bandages were applied over the phlebectomy sites and at the top of the saphenofemoral junction. Blood loss was less than 15 cc.  Discharge instructions reviewed with patient and hardcopy of discharge instructions given to patient to take home. The patient ambulated out of the operating room having tolerated the procedure well.

## 2019-11-26 ENCOUNTER — Ambulatory Visit (HOSPITAL_COMMUNITY)
Admission: RE | Admit: 2019-11-26 | Discharge: 2019-11-26 | Disposition: A | Discharge status: Home / Self Care | Payer: Medicare Other | Source: Ambulatory Visit | Attending: Vascular Surgery | Admitting: Vascular Surgery

## 2019-11-26 ENCOUNTER — Encounter: Payer: Self-pay | Admitting: Vascular Surgery

## 2019-11-26 ENCOUNTER — Other Ambulatory Visit: Payer: Self-pay

## 2019-11-26 ENCOUNTER — Ambulatory Visit (INDEPENDENT_AMBULATORY_CARE_PROVIDER_SITE_OTHER): Payer: Self-pay | Admitting: Vascular Surgery

## 2019-11-26 ENCOUNTER — Ambulatory Visit (HOSPITAL_COMMUNITY): Payer: PRIVATE HEALTH INSURANCE

## 2019-11-26 ENCOUNTER — Ambulatory Visit: Payer: PRIVATE HEALTH INSURANCE | Admitting: Vascular Surgery

## 2019-11-26 VITALS — BP 140/70 | HR 68 | Temp 97.9°F | Resp 16

## 2019-11-26 DIAGNOSIS — I83812 Varicose veins of left lower extremities with pain: Secondary | ICD-10-CM

## 2019-11-26 NOTE — Progress Notes (Signed)
Patient name: Beth Macdonald MRN: [id number] DOB: 05-Jan-1946 Sex: female  REASON FOR VISIT: Follow-up after laser ablation left anterior accessory saphenous vein with stab phlebectomies  HPI: Beth Macdonald is a 74 y.o. female who was originally seen back in August 2020 with venous hypertension.  She had a remote history of laser ablation of bilateral great saphenous veins.  She was having worsening varicose veins in the left leg and had an episode of phlebitis in the right calf.  She was noted to have a left anterior accessory saphenous vein with significant reflux that was significantly dilated and feeding a large cluster of varicose veins over the anterior lateral thigh and left knee.  She failed conservative treatment and was set up for laser ablation of the left anterior sensory saphenous vein and stab phlebectomies she comes in for a 1 week follow-up visit.  SYMPTOMS: The aching pain in her left leg has improved.  CONSERVATIVE TREATMENT: She continues to wear her thigh-high compression stockings.  I have told her that she can switch to knee-high stockings if these are more practical after 2 weeks is up.  VENOUS INTERVENTIONS: No interventions are planned on the right side.  Current Outpatient Medications  Medication Sig Dispense Refill  . aspirin 81 MG EC tablet Take 1 tablet (81 mg total) by mouth daily. 30 tablet 0  . atorvastatin (LIPITOR) 40 MG tablet Take 40 mg by mouth daily.     . cholecalciferol (VITAMIN D3) 25 MCG (1000 UT) tablet Take 1,000 Units by mouth daily.    . cycloSPORINE (RESTASIS) 0.05 % ophthalmic emulsion 1 drop 2 (two) times daily.    . Evolocumab with Infusor ([place]) 420 MG/3.5ML SOCT Inject 420 mg into the skin every 30 (thirty) days. 3.6 mL 12  . ezetimibe (ZETIA) 10 MG tablet Take by mouth daily.     Marland Kitchen MAGNESIUM PO Take 400 mcg by mouth daily.     . metFORMIN (GLUCOPHAGE) 500 MG tablet INCREASE TO 2 TABLETS TWICE A DAY AS DISCUSSED  ORALLY 90 DAYS    . OMEGA-3 FATTY ACIDS PO Take 1,000 mcg by mouth daily.      No current facility-administered medications for this visit.   REVIEW OF SYSTEMS: Valu.Nieves ] denotes positive finding; [  ] denotes negative finding  CARDIOVASCULAR:  [ ]  chest pain   [ ]  dyspnea on exertion  [ ]  leg swelling  CONSTITUTIONAL:  [ ]  fever   [ ]  chills  PHYSICAL EXAM: Vitals:   11/26/19 1258  BP: 140/70  Pulse: 68  Resp: 16  Temp: 97.9 F (36.6 C)  TempSrc: Temporal  SpO2: 98%   GENERAL: The patient is a well-nourished female, in no acute distress. The vital signs are documented above. CARDIOVASCULAR: There is a regular rate and rhythm. PULMONARY: There is good air exchange bilaterally without wheezing or rales. VASCULAR: She has noticed bruising overlying the anterior sensory saphenous vein.  She has mild bruising where she has had a stab phlebectomies.  DATA:  VENOUS DUPLEX: I have independently interpreted her venous duplex scan today.  There is no evidence of DVT.  The anterior sensory left saphenous vein is closed from 1 cm distal to the saphenofemoral junction to the mid thigh.  MEDICAL ISSUES:  STATUS POST STAB PHLEBECTOMIES AND LASER ABLATION LEFT ANTERIOR SENSORY SAPHENOUS VEIN: The patient is doing well status post laser ablation of the left anterior accessory saphenous vein and stab phlebectomies.  She will continue to wear her  thigh-high compression stockings for another week.  We discussed resuming water aerobics.  She is an Art therapist.  Also explained that if she would prefer to wear knee-high stockings after next week that these may be more practical.  I will see her back as needed.  Deitra Mayo [facility] [employer] [phone]

## 2019-11-30 DIAGNOSIS — E7800 Pure hypercholesterolemia, unspecified: Secondary | ICD-10-CM | POA: Diagnosis not present

## 2019-11-30 DIAGNOSIS — E1165 Type 2 diabetes mellitus with hyperglycemia: Secondary | ICD-10-CM | POA: Diagnosis not present

## 2019-11-30 DIAGNOSIS — I10 Essential (primary) hypertension: Secondary | ICD-10-CM | POA: Diagnosis not present

## 2019-12-07 DIAGNOSIS — I8393 Asymptomatic varicose veins of bilateral lower extremities: Secondary | ICD-10-CM | POA: Diagnosis not present

## 2019-12-07 DIAGNOSIS — E782 Mixed hyperlipidemia: Secondary | ICD-10-CM | POA: Diagnosis not present

## 2019-12-07 DIAGNOSIS — L719 Rosacea, unspecified: Secondary | ICD-10-CM | POA: Diagnosis not present

## 2019-12-07 DIAGNOSIS — I10 Essential (primary) hypertension: Secondary | ICD-10-CM | POA: Diagnosis not present

## 2019-12-07 DIAGNOSIS — E118 Type 2 diabetes mellitus with unspecified complications: Secondary | ICD-10-CM | POA: Diagnosis not present

## 2019-12-07 DIAGNOSIS — Z0000 Encounter for general adult medical examination without abnormal findings: Secondary | ICD-10-CM | POA: Diagnosis not present

## 2019-12-07 DIAGNOSIS — I773 Arterial fibromuscular dysplasia: Secondary | ICD-10-CM | POA: Diagnosis not present

## 2019-12-07 DIAGNOSIS — R911 Solitary pulmonary nodule: Secondary | ICD-10-CM | POA: Diagnosis not present

## 2019-12-07 DIAGNOSIS — M545 Low back pain: Secondary | ICD-10-CM | POA: Diagnosis not present

## 2019-12-07 DIAGNOSIS — Z791 Long term (current) use of non-steroidal anti-inflammatories (NSAID): Secondary | ICD-10-CM | POA: Diagnosis not present

## 2019-12-07 DIAGNOSIS — G629 Polyneuropathy, unspecified: Secondary | ICD-10-CM | POA: Diagnosis not present

## 2019-12-07 DIAGNOSIS — M542 Cervicalgia: Secondary | ICD-10-CM | POA: Diagnosis not present

## 2019-12-22 DIAGNOSIS — M8589 Other specified disorders of bone density and structure, multiple sites: Secondary | ICD-10-CM | POA: Diagnosis not present

## 2019-12-22 DIAGNOSIS — E118 Type 2 diabetes mellitus with unspecified complications: Secondary | ICD-10-CM | POA: Diagnosis not present

## 2019-12-22 DIAGNOSIS — M810 Age-related osteoporosis without current pathological fracture: Secondary | ICD-10-CM | POA: Diagnosis not present

## 2019-12-22 DIAGNOSIS — E782 Mixed hyperlipidemia: Secondary | ICD-10-CM | POA: Diagnosis not present

## 2019-12-22 DIAGNOSIS — I10 Essential (primary) hypertension: Secondary | ICD-10-CM | POA: Diagnosis not present

## 2019-12-23 DIAGNOSIS — Z1211 Encounter for screening for malignant neoplasm of colon: Secondary | ICD-10-CM | POA: Diagnosis not present

## 2019-12-23 DIAGNOSIS — Z1212 Encounter for screening for malignant neoplasm of rectum: Secondary | ICD-10-CM | POA: Diagnosis not present

## 2019-12-25 ENCOUNTER — Other Ambulatory Visit: Payer: Self-pay

## 2019-12-25 ENCOUNTER — Encounter: Payer: Self-pay | Admitting: Physician Assistant

## 2019-12-25 ENCOUNTER — Ambulatory Visit (INDEPENDENT_AMBULATORY_CARE_PROVIDER_SITE_OTHER): Payer: MEDICARE | Admitting: Physician Assistant

## 2019-12-25 DIAGNOSIS — Z1283 Encounter for screening for malignant neoplasm of skin: Secondary | ICD-10-CM

## 2019-12-25 DIAGNOSIS — L570 Actinic keratosis: Secondary | ICD-10-CM

## 2019-12-25 MED ORDER — ALCLOMETASONE DIPROPIONATE 0.05 % EX CREA
TOPICAL_CREAM | Freq: Two times a day (BID) | CUTANEOUS | 1 refills | Status: DC
Start: 2019-12-25 — End: 2021-12-12

## 2019-12-25 NOTE — Progress Notes (Addendum)
   Follow-Up Visit   Subjective  Beth Macdonald is a 74 y.o. female who presents for the following: Skin Problem (Here this morning for a place on her nose x13months. Very rough and flakes. Did have trauma to the area; trash can lid came down on her face and sratched her nose. Was sore and bled occasionally. Was not sure if this place was there prior to the trauma. ).    The following portions of the chart were reviewed this encounter and updated as appropriate: Tobacco  Allergies  Meds  Problems  Med Hx  Surg Hx  Fam Hx  Soc Hx      Objective  Well appearing patient in no apparent distress; mood and affect are within normal limits.  A focused examination was performed including face/nose. Relevant physical exam findings are noted in the Assessment and Plan.  Objective  Right Nasal Sidewall: Erythematous patches with gritty scale.  Objective  waist up: No Dn or signs of NMSC  Assessment & Plan  AK (actinic keratosis) Right Nasal Sidewall  Destruction of lesion - Right Nasal Sidewall Complexity: simple   Destruction method: cryotherapy   Informed consent: discussed and consent obtained   Timeout:  patient name, date of birth, surgical site, and procedure verified Lesion destroyed using liquid nitrogen: Yes   Cryotherapy cycles:  1 Outcome: patient tolerated procedure well with no complications   Post-procedure details: wound care instructions given    Screening exam for skin cancer waist up  Yearly skin exams

## 2020-02-08 DIAGNOSIS — W57XXXA Bitten or stung by nonvenomous insect and other nonvenomous arthropods, initial encounter: Secondary | ICD-10-CM | POA: Diagnosis not present

## 2020-02-08 DIAGNOSIS — S2096XA Insect bite (nonvenomous) of unspecified parts of thorax, initial encounter: Secondary | ICD-10-CM | POA: Diagnosis not present

## 2020-02-13 NOTE — Addendum Note (Signed)
Addended by: Robyne Askew R on: 02/13/2020 03:42 PM   Modules accepted: Level of Service

## 2020-02-16 ENCOUNTER — Ambulatory Visit: Payer: MEDICARE | Admitting: Physician Assistant

## 2020-03-04 DIAGNOSIS — S20461D Insect bite (nonvenomous) of right back wall of thorax, subsequent encounter: Secondary | ICD-10-CM | POA: Diagnosis not present

## 2020-03-04 DIAGNOSIS — W57XXXD Bitten or stung by nonvenomous insect and other nonvenomous arthropods, subsequent encounter: Secondary | ICD-10-CM | POA: Diagnosis not present

## 2020-03-15 NOTE — Progress Notes (Signed)
Subjective:   Beth Macdonald, female    DOB: 04-22-1946, 74 y.o.   MRN: [id number]   Chief complaint:  H/o stroke   HPI  74 y/o Caucasian female with hypertension, hyperlipidemia, diabetes melitus, h/o TIA (10/2018), murmur  She was recently found to have a murmur on physical exam.   Lipids have significantly improved on Repatha.  She recently had a tick bite from an unknown tick. She has been on two rounds of doxycycline. She finally has had improvement in her flu like symptoms, but fatigue persists. However,, she denies any cardiac manifestations such as lightheadedness, presyncope, syncope, shortness of breath, leg edema.    Current Outpatient Medications on File Prior to Visit  Medication Sig Dispense Refill  . alclomethasone (ACLOVATE) 0.05 % cream Apply topically 2 (two) times daily. 60 g 1  . alendronate (FOSAMAX) 70 MG tablet Take 70 mg by mouth once a week.    Marland Kitchen aspirin 81 MG EC tablet Take 1 tablet (81 mg total) by mouth daily. 30 tablet 0  . atorvastatin (LIPITOR) 40 MG tablet Take 40 mg by mouth daily.     . cholecalciferol (VITAMIN D3) 25 MCG (1000 UT) tablet Take 1,000 Units by mouth daily.    . cycloSPORINE (RESTASIS) 0.05 % ophthalmic emulsion 1 drop 2 (two) times daily.    . Evolocumab with Infusor ([place]) 420 MG/3.5ML SOCT Inject 420 mg into the skin every 30 (thirty) days. 3.6 mL 12  . ezetimibe (ZETIA) 10 MG tablet Take by mouth daily.     Marland Kitchen ibuprofen (ADVIL) 800 MG tablet     . MAGNESIUM PO Take 400 mcg by mouth daily.     . metFORMIN (GLUCOPHAGE) 500 MG tablet INCREASE TO 2 TABLETS TWICE A DAY AS DISCUSSED ORALLY 90 DAYS    . OMEGA-3 FATTY ACIDS PO Take 1,000 mcg by mouth daily.      No current facility-administered medications on file prior to visit.    Cardiovascular studies:  Echocardiogram 10/30/2018: Normal size LV. EF 60-65%. Abnormal relaxation.  Mild MAC and mitral valve calcification. Moderate thickening of tricuspid  valve.  Recent labs: 02/23/2020: Glucose 111, BUN/Cr 21/0.7. EGFR 65. HbA1C 7.1% Chol 109, TG 120, HDL 74, LDL 14   02/19/2019: Glucose 111. BUN/Cr 21/0.77. eGFR 77. Rest of the CMP normal.  H/H 12/39. MCV 95. Platelets 243. Chol 205. TG 121. HDL 96. LDL 85.     Review of Systems  Cardiovascular: Negative for chest pain, dyspnea on exertion, leg swelling, palpitations and syncope.        Vitals:   03/16/20 1421  BP: 132/61  Pulse: 71  SpO2: 98%     Body mass index is 27.25 kg/m. Filed Weights   03/16/20 1421  Weight: 179 lb 3.2 oz (81.3 kg)     Objective:    Physical Exam Vitals and nursing note reviewed.  Constitutional:      General: She is not in acute distress. Neck:     Vascular: No JVD.  Cardiovascular:     Rate and Rhythm: Normal rate and regular rhythm.     Heart sounds: Murmur heard.  Harsh midsystolic murmur is present with a grade of 2/6 at the upper right sternal border.   Pulmonary:     Effort: Pulmonary effort is normal.     Breath sounds: Normal breath sounds. No wheezing or rales.          Assessment & Recommendations:   74 y/o Caucasian female  with hypertension, hyperlipidemia, diabetes melitus, h/o TIA (10/2018), murmur  Murmur:  Either mild AS or MR. Will check echcoardiogram  Hypertension: Well controlled  Hyperlipidemia: LDL down to 14 on Repatha  F/u as needed  Nigel Mormon, MD [facility] [place] Pager: [phone] Office: [phone] If no answer Cell [phone]

## 2020-03-16 ENCOUNTER — Ambulatory Visit: Payer: MEDICARE | Admitting: Cardiology

## 2020-03-16 ENCOUNTER — Other Ambulatory Visit: Payer: Self-pay | Admitting: Cardiology

## 2020-03-16 ENCOUNTER — Other Ambulatory Visit: Payer: Self-pay

## 2020-03-16 ENCOUNTER — Encounter: Payer: Self-pay | Admitting: Cardiology

## 2020-03-16 VITALS — BP 132/61 | HR 71 | Ht 68.0 in | Wt 179.2 lb

## 2020-03-16 DIAGNOSIS — R011 Cardiac murmur, unspecified: Secondary | ICD-10-CM

## 2020-03-16 DIAGNOSIS — Z8673 Personal history of transient ischemic attack (TIA), and cerebral infarction without residual deficits: Secondary | ICD-10-CM

## 2020-03-16 DIAGNOSIS — E782 Mixed hyperlipidemia: Secondary | ICD-10-CM

## 2020-03-18 ENCOUNTER — Other Ambulatory Visit: Payer: MEDICARE

## 2020-03-21 ENCOUNTER — Other Ambulatory Visit: Payer: Self-pay

## 2020-03-21 ENCOUNTER — Ambulatory Visit: Payer: MEDICARE

## 2020-03-21 DIAGNOSIS — R011 Cardiac murmur, unspecified: Secondary | ICD-10-CM

## 2020-03-21 DIAGNOSIS — I350 Nonrheumatic aortic (valve) stenosis: Secondary | ICD-10-CM

## 2020-03-29 ENCOUNTER — Encounter: Payer: Self-pay | Admitting: Physician Assistant

## 2020-03-29 ENCOUNTER — Ambulatory Visit (INDEPENDENT_AMBULATORY_CARE_PROVIDER_SITE_OTHER): Payer: MEDICARE | Admitting: Physician Assistant

## 2020-03-29 ENCOUNTER — Other Ambulatory Visit: Payer: Self-pay

## 2020-03-29 DIAGNOSIS — D229 Melanocytic nevi, unspecified: Secondary | ICD-10-CM

## 2020-03-29 DIAGNOSIS — L814 Other melanin hyperpigmentation: Secondary | ICD-10-CM

## 2020-03-29 DIAGNOSIS — D0439 Carcinoma in situ of skin of other parts of face: Secondary | ICD-10-CM | POA: Diagnosis not present

## 2020-03-29 DIAGNOSIS — Z1283 Encounter for screening for malignant neoplasm of skin: Secondary | ICD-10-CM

## 2020-03-29 DIAGNOSIS — R21 Rash and other nonspecific skin eruption: Secondary | ICD-10-CM

## 2020-03-29 DIAGNOSIS — L578 Other skin changes due to chronic exposure to nonionizing radiation: Secondary | ICD-10-CM

## 2020-03-29 DIAGNOSIS — D1800 Hemangioma unspecified site: Secondary | ICD-10-CM

## 2020-03-29 DIAGNOSIS — L821 Other seborrheic keratosis: Secondary | ICD-10-CM

## 2020-03-29 MED ORDER — CLOBETASOL PROP EMOLLIENT BASE 0.05 % EX CREA
1.0000 | TOPICAL_CREAM | Freq: Two times a day (BID) | CUTANEOUS | 2 refills | Status: DC
Start: 2020-03-29 — End: 2021-12-12

## 2020-03-29 NOTE — Progress Notes (Signed)
Follow-Up Visit   Subjective  Beth Macdonald is a 74 y.o. female who presents for the following: Follow-up (on lesions on right side nose. Was frozen last visit but patient says we are doing something else today. Possible BX? ).   The following portions of the chart were reviewed this encounter and updated as appropriate: Tobacco  Allergies  Meds  Problems  Med Hx  Surg Hx  Fam Hx      Objective  Well appearing patient in no apparent distress; mood and affect are within normal limits.  A focused examination was performed including face. Relevant physical exam findings are noted in the Assessment and Plan.  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  Skin / nail biopsy - Right Nasal Sidewall Type of biopsy: tangential   Informed consent: discussed and consent obtained   Timeout: patient name, date of birth, surgical site, and procedure verified   Procedure prep:  Patient was prepped and draped in usual sterile fashion (Non sterile) Prep type:  Chlorhexidine Anesthesia: the lesion was anesthetized in a standard fashion   Anesthetic:  1% lidocaine w/ epinephrine 1-100,000 local infiltration Instrument used: flexible razor blade   Hemostasis achieved with: ferric subsulfate   Outcome: patient tolerated procedure well   Post-procedure details: wound care instructions given    Destruction of lesion - Right Nasal Sidewall  Destruction method: electrodesiccation and curettage   Informed consent:  discussed and consent obtained   Timeout:  patient name, date of birth, surgical site, and procedure verified Anesthesia: the lesion was anesthetized in a standard fashion   Anesthetic:  1% lidocaine w/ epinephrine 1-100,000 local infiltration Curettage performed in three different directions: Yes   Electrodesiccation performed over the curetted area: Yes   Curettage cycles:  3 Lesion length (cm):  1.3 Lesion width (cm):  1 Margin per side (cm):  0.1 Final wound size (cm):  1.5 Hemostasis achieved with:  aluminum chloride Outcome: patient tolerated procedure well with no complications   Post-procedure details: wound care instructions given    Specimen 1 - Surgical pathology Differential Diagnosis: scc vs bcc Check Margins: No Txpb ed&c  Screening exam for skin cancer Head to toe  Yearly skin exam Lentigines - Scattered tan macules - Discussed due to sun exposure - Benign, observe - Call for any changes  Seborrheic Keratoses - Stuck-on, waxy, tan-brown papules and plaques  - Discussed benign etiology and prognosis. - Observe - Call for any changes  Melanocytic Nevi - Tan-brown and/or pink-flesh-colored symmetric macules and papules - Benign appearing on exam today - Observation - Call clinic for new or changing moles - Recommend daily use of broad spectrum spf 30+ sunscreen to sun-exposed areas.   Hemangiomas - Red papules - Discussed benign nature - Observe - Call for any changes  Actinic Damage - diffuse scaly erythematous macules with underlying dyspigmentation - Recommend daily broad spectrum sunscreen SPF 30+ to sun-exposed areas, reapply every 2 hours as needed.  - Call for new or  changing lesions.  Skin cancer screening performed today.   I, Carmel Waddington, PA-C, have reviewed all documentation's for this visit.  The documentation on 04/01/20 for the exam, diagnosis, procedures and orders are all accurate and complete.

## 2020-03-29 NOTE — Patient Instructions (Signed)

## 2020-04-05 ENCOUNTER — Telehealth: Payer: Self-pay | Admitting: *Deleted

## 2020-04-05 NOTE — Telephone Encounter (Signed)
Pathology to patient: patient already had 6 month follow up appointment.

## 2020-04-05 NOTE — Telephone Encounter (Signed)
Left message for patient to call us back.  

## 2020-04-05 NOTE — Telephone Encounter (Signed)
-----   Message from Warren Danes, [place] sent at 04/01/2020 11:57 AM EDT ----- 6 mo F/u txwbx

## 2020-04-07 ENCOUNTER — Ambulatory Visit: Payer: MEDICARE | Admitting: Physician Assistant

## 2020-04-20 DIAGNOSIS — Z01419 Encounter for gynecological examination (general) (routine) without abnormal findings: Secondary | ICD-10-CM | POA: Diagnosis not present

## 2020-04-20 DIAGNOSIS — Z780 Asymptomatic menopausal state: Secondary | ICD-10-CM | POA: Diagnosis not present

## 2020-05-23 DIAGNOSIS — E782 Mixed hyperlipidemia: Secondary | ICD-10-CM | POA: Diagnosis not present

## 2020-05-23 DIAGNOSIS — I10 Essential (primary) hypertension: Secondary | ICD-10-CM | POA: Diagnosis not present

## 2020-05-23 DIAGNOSIS — E1165 Type 2 diabetes mellitus with hyperglycemia: Secondary | ICD-10-CM | POA: Diagnosis not present

## 2020-05-23 DIAGNOSIS — Z8673 Personal history of transient ischemic attack (TIA), and cerebral infarction without residual deficits: Secondary | ICD-10-CM | POA: Diagnosis not present

## 2020-05-25 DIAGNOSIS — E785 Hyperlipidemia, unspecified: Secondary | ICD-10-CM | POA: Diagnosis not present

## 2020-05-25 DIAGNOSIS — E782 Mixed hyperlipidemia: Secondary | ICD-10-CM | POA: Diagnosis not present

## 2020-05-25 DIAGNOSIS — E875 Hyperkalemia: Secondary | ICD-10-CM | POA: Diagnosis not present

## 2020-05-25 DIAGNOSIS — E118 Type 2 diabetes mellitus with unspecified complications: Secondary | ICD-10-CM | POA: Diagnosis not present

## 2020-05-25 DIAGNOSIS — I10 Essential (primary) hypertension: Secondary | ICD-10-CM | POA: Diagnosis not present

## 2020-05-27 DIAGNOSIS — Z23 Encounter for immunization: Secondary | ICD-10-CM | POA: Diagnosis not present

## 2020-06-14 ENCOUNTER — Ambulatory Visit: Payer: MEDICARE | Attending: Internal Medicine

## 2020-06-14 DIAGNOSIS — Z23 Encounter for immunization: Secondary | ICD-10-CM

## 2020-06-14 NOTE — Progress Notes (Signed)
   Covid-19 Vaccination Clinic  Name:  Beth Macdonald    MRN: [id number] DOB: 24-May-1946  06/14/2020  Beth Macdonald was observed post Covid-19 immunization for 15 minutes without incident. She was provided with Vaccine Information Sheet and instruction to access the V-Safe system.   Beth Macdonald was instructed to call 911 with any severe reactions post vaccine: Marland Kitchen Difficulty breathing  . Swelling of face and throat  . A fast heartbeat  . A bad rash all over body  . Dizziness and weakness

## 2020-08-04 ENCOUNTER — Ambulatory Visit (INDEPENDENT_AMBULATORY_CARE_PROVIDER_SITE_OTHER): Payer: MEDICARE

## 2020-08-04 ENCOUNTER — Ambulatory Visit (INDEPENDENT_AMBULATORY_CARE_PROVIDER_SITE_OTHER): Payer: MEDICARE | Admitting: Podiatry

## 2020-08-04 ENCOUNTER — Other Ambulatory Visit: Payer: Self-pay

## 2020-08-04 DIAGNOSIS — M7661 Achilles tendinitis, right leg: Secondary | ICD-10-CM

## 2020-08-04 DIAGNOSIS — M722 Plantar fascial fibromatosis: Secondary | ICD-10-CM

## 2020-08-04 DIAGNOSIS — M7731 Calcaneal spur, right foot: Secondary | ICD-10-CM

## 2020-08-04 NOTE — Patient Instructions (Signed)

## 2020-08-08 DIAGNOSIS — R197 Diarrhea, unspecified: Secondary | ICD-10-CM | POA: Diagnosis not present

## 2020-08-09 NOTE — Progress Notes (Signed)
Subjective: 74 year old female presents the office today for concerns of right Achilles tendon, heel pain.  She states that she is unable to wear shoes because of discomfort in the back of the heel and she is wearing sandals.  She denies any recent injury or trauma to the foot.  She has had ibuprofen at home that she uses as well as Voltaren gel.  She has not tried Voltaren gel on the heel yet. Denies any systemic complaints such as fevers, chills, nausea, vomiting. No acute changes since last appointment, and no other complaints at this time.   Objective: AAO x3, NAD DP/PT pulses palpable bilaterally, CRT less than 3 seconds There is tenderness palpation of the distal portion Achilles tendon on the insertion of the calcaneus.  Thompson test is negative.  The Achilles tendon appears to be intact.  Prominent bone spur present of the posterior calcaneus with mild discomfort.  There is no pain with lateral compression of calcaneus.  No other areas of discomfort.  No pain in the plantar fascia.  MMT 5/5. No pain with calf compression, swelling, warmth, erythema  Assessment: Right posterior calcaneal spurring, Achilles tendinitis  Plan: -All treatment options discussed with the patient including all alternatives, risks, complications.  -X-rays obtained reviewed.  Calcaneal spurs present.  No evidence of acute fracture identified today. -We discussed stretching, icing daily.  Dispensed heel lift.  Dispensed a gel offloading pad.  Night splint dispensed.  She can use Voltaren gel or ibuprofen as needed. -Patient encouraged to call the office with any questions, concerns, change in symptoms.   Trula Slade DPM

## 2020-08-22 DIAGNOSIS — R197 Diarrhea, unspecified: Secondary | ICD-10-CM | POA: Diagnosis not present

## 2020-08-22 DIAGNOSIS — E118 Type 2 diabetes mellitus with unspecified complications: Secondary | ICD-10-CM | POA: Diagnosis not present

## 2020-08-22 DIAGNOSIS — I10 Essential (primary) hypertension: Secondary | ICD-10-CM | POA: Diagnosis not present

## 2020-08-22 DIAGNOSIS — E782 Mixed hyperlipidemia: Secondary | ICD-10-CM | POA: Diagnosis not present

## 2020-08-24 DIAGNOSIS — E1121 Type 2 diabetes mellitus with diabetic nephropathy: Secondary | ICD-10-CM | POA: Diagnosis not present

## 2020-08-24 DIAGNOSIS — E118 Type 2 diabetes mellitus with unspecified complications: Secondary | ICD-10-CM | POA: Diagnosis not present

## 2020-08-24 DIAGNOSIS — E782 Mixed hyperlipidemia: Secondary | ICD-10-CM | POA: Diagnosis not present

## 2020-08-24 DIAGNOSIS — I10 Essential (primary) hypertension: Secondary | ICD-10-CM | POA: Diagnosis not present

## 2020-08-24 DIAGNOSIS — E875 Hyperkalemia: Secondary | ICD-10-CM | POA: Diagnosis not present

## 2020-08-24 DIAGNOSIS — Z8673 Personal history of transient ischemic attack (TIA), and cerebral infarction without residual deficits: Secondary | ICD-10-CM | POA: Diagnosis not present

## 2020-09-06 ENCOUNTER — Ambulatory Visit: Payer: MEDICARE | Admitting: Physician Assistant

## 2020-09-09 ENCOUNTER — Other Ambulatory Visit: Payer: MEDICARE

## 2020-09-09 ENCOUNTER — Other Ambulatory Visit: Payer: Self-pay

## 2020-09-09 DIAGNOSIS — Z20822 Contact with and (suspected) exposure to covid-19: Secondary | ICD-10-CM

## 2020-09-13 LAB — NOVEL CORONAVIRUS, NAA: SARS-CoV-2, NAA: NOT DETECTED

## 2020-09-15 ENCOUNTER — Ambulatory Visit: Payer: MEDICARE | Admitting: Podiatry

## 2020-09-21 DIAGNOSIS — I10 Essential (primary) hypertension: Secondary | ICD-10-CM | POA: Diagnosis not present

## 2020-09-21 DIAGNOSIS — E1121 Type 2 diabetes mellitus with diabetic nephropathy: Secondary | ICD-10-CM | POA: Diagnosis not present

## 2020-09-28 ENCOUNTER — Other Ambulatory Visit: Payer: Self-pay

## 2020-09-28 ENCOUNTER — Encounter: Payer: Self-pay | Admitting: Physician Assistant

## 2020-09-28 ENCOUNTER — Ambulatory Visit (INDEPENDENT_AMBULATORY_CARE_PROVIDER_SITE_OTHER): Payer: MEDICARE | Admitting: Physician Assistant

## 2020-09-28 DIAGNOSIS — L570 Actinic keratosis: Secondary | ICD-10-CM | POA: Diagnosis not present

## 2020-09-28 MED ORDER — TOLAK 4 % EX CREA: 1.0000 | TOPICAL_CREAM | Freq: Every evening | CUTANEOUS | 0 refills | Status: DC

## 2020-09-28 NOTE — Patient Instructions (Addendum)
If you haven't heard from [facility] by the end of the day today then contact them at [phone].

## 2020-10-04 DIAGNOSIS — Z961 Presence of intraocular lens: Secondary | ICD-10-CM | POA: Diagnosis not present

## 2020-10-04 DIAGNOSIS — H04123 Dry eye syndrome of bilateral lacrimal glands: Secondary | ICD-10-CM | POA: Diagnosis not present

## 2020-10-04 DIAGNOSIS — E119 Type 2 diabetes mellitus without complications: Secondary | ICD-10-CM | POA: Diagnosis not present

## 2020-10-04 DIAGNOSIS — H5213 Myopia, bilateral: Secondary | ICD-10-CM | POA: Diagnosis not present

## 2020-10-12 ENCOUNTER — Encounter: Payer: Self-pay | Admitting: Physician Assistant

## 2020-10-17 DIAGNOSIS — M47896 Other spondylosis, lumbar region: Secondary | ICD-10-CM | POA: Diagnosis not present

## 2020-10-17 DIAGNOSIS — M542 Cervicalgia: Secondary | ICD-10-CM | POA: Diagnosis not present

## 2020-10-19 DIAGNOSIS — I10 Essential (primary) hypertension: Secondary | ICD-10-CM | POA: Diagnosis not present

## 2020-10-20 DIAGNOSIS — M542 Cervicalgia: Secondary | ICD-10-CM | POA: Diagnosis not present

## 2020-10-20 DIAGNOSIS — M47896 Other spondylosis, lumbar region: Secondary | ICD-10-CM | POA: Diagnosis not present

## 2020-10-24 DIAGNOSIS — M47896 Other spondylosis, lumbar region: Secondary | ICD-10-CM | POA: Diagnosis not present

## 2020-10-24 DIAGNOSIS — M542 Cervicalgia: Secondary | ICD-10-CM | POA: Diagnosis not present

## 2020-10-27 ENCOUNTER — Encounter: Payer: Self-pay | Admitting: Physician Assistant

## 2020-10-27 DIAGNOSIS — M47896 Other spondylosis, lumbar region: Secondary | ICD-10-CM | POA: Diagnosis not present

## 2020-10-27 DIAGNOSIS — M542 Cervicalgia: Secondary | ICD-10-CM | POA: Diagnosis not present

## 2020-10-27 NOTE — Progress Notes (Signed)
   Follow-Up Visit   Subjective  Beth Macdonald is a 75 y.o. female who presents for the following: Annual Exam (Check spot on nose).   The following portions of the chart were reviewed this encounter and updated as appropriate:  Tobacco  Allergies  Meds  Problems  Med Hx  Surg Hx  Fam Hx      Objective  Well appearing patient in no apparent distress; mood and affect are within normal limits.  A focused examination was performed including face, neck, chest and back. Relevant physical exam findings are noted in the Assessment and Plan.  Objective  Dorsum of Nose: Erythematous patches with gritty scale.   Assessment & Plan  AK (actinic keratosis) Dorsum of Nose  Apply topical cream nightly x 2 weeks.  Fluorouracil (TOLAK) 4 % CREA - Dorsum of Nose    I, Raea Magallon, PA-C, have reviewed all documentation's for this visit.  The documentation on 10/27/20 for the exam, diagnosis, procedures and orders are all accurate and complete.

## 2020-10-31 DIAGNOSIS — M542 Cervicalgia: Secondary | ICD-10-CM | POA: Diagnosis not present

## 2020-10-31 DIAGNOSIS — M47896 Other spondylosis, lumbar region: Secondary | ICD-10-CM | POA: Diagnosis not present

## 2020-11-03 DIAGNOSIS — M47896 Other spondylosis, lumbar region: Secondary | ICD-10-CM | POA: Diagnosis not present

## 2020-11-03 DIAGNOSIS — M542 Cervicalgia: Secondary | ICD-10-CM | POA: Diagnosis not present

## 2020-11-09 DIAGNOSIS — M47896 Other spondylosis, lumbar region: Secondary | ICD-10-CM | POA: Diagnosis not present

## 2020-11-09 DIAGNOSIS — M542 Cervicalgia: Secondary | ICD-10-CM | POA: Diagnosis not present

## 2020-11-15 DIAGNOSIS — M542 Cervicalgia: Secondary | ICD-10-CM | POA: Diagnosis not present

## 2020-11-15 DIAGNOSIS — M47896 Other spondylosis, lumbar region: Secondary | ICD-10-CM | POA: Diagnosis not present

## 2020-11-24 DIAGNOSIS — M542 Cervicalgia: Secondary | ICD-10-CM | POA: Diagnosis not present

## 2020-11-24 DIAGNOSIS — M47896 Other spondylosis, lumbar region: Secondary | ICD-10-CM | POA: Diagnosis not present

## 2020-11-30 ENCOUNTER — Ambulatory Visit (INDEPENDENT_AMBULATORY_CARE_PROVIDER_SITE_OTHER): Payer: MEDICARE | Admitting: Physician Assistant

## 2020-11-30 ENCOUNTER — Other Ambulatory Visit: Payer: Self-pay

## 2020-11-30 ENCOUNTER — Encounter: Payer: Self-pay | Admitting: Physician Assistant

## 2020-11-30 DIAGNOSIS — L570 Actinic keratosis: Secondary | ICD-10-CM

## 2020-11-30 DIAGNOSIS — D485 Neoplasm of uncertain behavior of skin: Secondary | ICD-10-CM

## 2020-11-30 NOTE — Progress Notes (Signed)
   Follow-Up Visit   Subjective  Beth Macdonald is a 75 y.o. female who presents for the following: Annual Exam (Nose ) and Follow-up (2 weeks tolak treatment, red no scale during treatment).   The following portions of the chart were reviewed this encounter and updated as appropriate:  Tobacco  Allergies  Meds  Problems  Med Hx  Surg Hx  Fam Hx      Objective  Well appearing patient in no apparent distress; mood and affect are within normal limits.  A focused examination was performed including face. Relevant physical exam findings are noted in the Assessment and Plan.  Objective  Right Nasal Sidewall: Pink pearly papule- history of cis      Assessment & Plan  Neoplasm of uncertain behavior of skin Right Nasal Sidewall  Skin / nail biopsy Type of biopsy: punch   Informed consent: discussed and consent obtained   Timeout: patient name, date of birth, surgical site, and procedure verified   Procedure prep:  Patient was prepped and draped in usual sterile fashion (Non sterile) Prep type:  Chlorhexidine Anesthesia: the lesion was anesthetized in a standard fashion   Anesthetic:  1% lidocaine w/ epinephrine 1-100,000 local infiltration Punch size:  5 mm Suture size:  5-0 Suture type: nylon   Suture removal (days):  8 Hemostasis achieved with: suture   Outcome: patient tolerated procedure well   Additional details:  Nylon x 2  Specimen 1 - Surgical pathology Differential Diagnosis: bcc vs scc [id number]  Check Margins: No    I, Shermaine Brigham, PA-C, have reviewed all documentation's for this visit.  The documentation on 11/30/20 for the exam, diagnosis, procedures and orders are all accurate and complete.

## 2020-12-06 DIAGNOSIS — M47896 Other spondylosis, lumbar region: Secondary | ICD-10-CM | POA: Diagnosis not present

## 2020-12-06 DIAGNOSIS — M542 Cervicalgia: Secondary | ICD-10-CM | POA: Diagnosis not present

## 2020-12-06 DIAGNOSIS — H8111 Benign paroxysmal vertigo, right ear: Secondary | ICD-10-CM | POA: Diagnosis not present

## 2020-12-07 ENCOUNTER — Other Ambulatory Visit: Payer: Self-pay

## 2020-12-07 ENCOUNTER — Ambulatory Visit (INDEPENDENT_AMBULATORY_CARE_PROVIDER_SITE_OTHER): Payer: MEDICARE | Admitting: *Deleted

## 2020-12-07 DIAGNOSIS — Z4802 Encounter for removal of sutures: Secondary | ICD-10-CM

## 2020-12-07 DIAGNOSIS — I10 Essential (primary) hypertension: Secondary | ICD-10-CM | POA: Diagnosis not present

## 2020-12-07 DIAGNOSIS — E1165 Type 2 diabetes mellitus with hyperglycemia: Secondary | ICD-10-CM | POA: Diagnosis not present

## 2020-12-07 NOTE — Progress Notes (Signed)
Here for suture removal x 2- right nasal sidewall. Lesion healing well, no sign or symptoms of infection.  Pathology not back yet told patient to call later this week for results.

## 2020-12-12 ENCOUNTER — Telehealth: Payer: Self-pay | Admitting: *Deleted

## 2020-12-12 DIAGNOSIS — R197 Diarrhea, unspecified: Secondary | ICD-10-CM | POA: Diagnosis not present

## 2020-12-12 DIAGNOSIS — E782 Mixed hyperlipidemia: Secondary | ICD-10-CM | POA: Diagnosis not present

## 2020-12-12 DIAGNOSIS — I10 Essential (primary) hypertension: Secondary | ICD-10-CM | POA: Diagnosis not present

## 2020-12-12 DIAGNOSIS — Z8673 Personal history of transient ischemic attack (TIA), and cerebral infarction without residual deficits: Secondary | ICD-10-CM | POA: Diagnosis not present

## 2020-12-12 DIAGNOSIS — E1121 Type 2 diabetes mellitus with diabetic nephropathy: Secondary | ICD-10-CM | POA: Diagnosis not present

## 2020-12-12 DIAGNOSIS — Z0000 Encounter for general adult medical examination without abnormal findings: Secondary | ICD-10-CM | POA: Diagnosis not present

## 2020-12-12 DIAGNOSIS — R42 Dizziness and giddiness: Secondary | ICD-10-CM | POA: Diagnosis not present

## 2020-12-12 DIAGNOSIS — I700 Atherosclerosis of aorta: Secondary | ICD-10-CM | POA: Diagnosis not present

## 2020-12-12 DIAGNOSIS — Z0001 Encounter for general adult medical examination with abnormal findings: Secondary | ICD-10-CM | POA: Diagnosis not present

## 2020-12-12 DIAGNOSIS — I340 Nonrheumatic mitral (valve) insufficiency: Secondary | ICD-10-CM | POA: Diagnosis not present

## 2020-12-12 DIAGNOSIS — M810 Age-related osteoporosis without current pathological fracture: Secondary | ICD-10-CM | POA: Diagnosis not present

## 2020-12-12 DIAGNOSIS — E118 Type 2 diabetes mellitus with unspecified complications: Secondary | ICD-10-CM | POA: Diagnosis not present

## 2020-12-12 NOTE — Telephone Encounter (Signed)
-----   Message from Warren Danes, [place] sent at 12/12/2020  8:02 AM EDT ----- No cancer.  Recheck 3-6 months

## 2020-12-12 NOTE — Telephone Encounter (Signed)
Pathology to patient- patient states she already has follow up appointment.

## 2020-12-13 ENCOUNTER — Encounter: Payer: Self-pay | Admitting: Gastroenterology

## 2020-12-14 DIAGNOSIS — M542 Cervicalgia: Secondary | ICD-10-CM | POA: Diagnosis not present

## 2020-12-14 DIAGNOSIS — M47896 Other spondylosis, lumbar region: Secondary | ICD-10-CM | POA: Diagnosis not present

## 2020-12-14 DIAGNOSIS — H8111 Benign paroxysmal vertigo, right ear: Secondary | ICD-10-CM | POA: Diagnosis not present

## 2020-12-15 DIAGNOSIS — M47896 Other spondylosis, lumbar region: Secondary | ICD-10-CM | POA: Diagnosis not present

## 2020-12-15 DIAGNOSIS — H8111 Benign paroxysmal vertigo, right ear: Secondary | ICD-10-CM | POA: Diagnosis not present

## 2020-12-15 DIAGNOSIS — M542 Cervicalgia: Secondary | ICD-10-CM | POA: Diagnosis not present

## 2020-12-28 DIAGNOSIS — H8111 Benign paroxysmal vertigo, right ear: Secondary | ICD-10-CM | POA: Diagnosis not present

## 2020-12-28 DIAGNOSIS — M542 Cervicalgia: Secondary | ICD-10-CM | POA: Diagnosis not present

## 2020-12-28 DIAGNOSIS — M47896 Other spondylosis, lumbar region: Secondary | ICD-10-CM | POA: Diagnosis not present

## 2021-01-09 DIAGNOSIS — Z23 Encounter for immunization: Secondary | ICD-10-CM | POA: Diagnosis not present

## 2021-01-19 DIAGNOSIS — M47896 Other spondylosis, lumbar region: Secondary | ICD-10-CM | POA: Diagnosis not present

## 2021-01-19 DIAGNOSIS — M542 Cervicalgia: Secondary | ICD-10-CM | POA: Diagnosis not present

## 2021-01-19 DIAGNOSIS — H8111 Benign paroxysmal vertigo, right ear: Secondary | ICD-10-CM | POA: Diagnosis not present

## 2021-02-01 DIAGNOSIS — M47896 Other spondylosis, lumbar region: Secondary | ICD-10-CM | POA: Diagnosis not present

## 2021-02-01 DIAGNOSIS — M542 Cervicalgia: Secondary | ICD-10-CM | POA: Diagnosis not present

## 2021-02-01 DIAGNOSIS — H8111 Benign paroxysmal vertigo, right ear: Secondary | ICD-10-CM | POA: Diagnosis not present

## 2021-02-14 ENCOUNTER — Ambulatory Visit (INDEPENDENT_AMBULATORY_CARE_PROVIDER_SITE_OTHER): Payer: MEDICARE | Admitting: Gastroenterology

## 2021-02-14 VITALS — BP 124/68 | HR 76 | Ht 68.0 in | Wt 186.5 lb

## 2021-02-14 DIAGNOSIS — K529 Noninfective gastroenteritis and colitis, unspecified: Secondary | ICD-10-CM | POA: Diagnosis not present

## 2021-02-14 DIAGNOSIS — R1084 Generalized abdominal pain: Secondary | ICD-10-CM | POA: Diagnosis not present

## 2021-02-14 MED ORDER — PLENVU 140 G PO SOLR: 140.0000 g | ORAL | 0 refills | Status: DC

## 2021-02-14 NOTE — Patient Instructions (Signed)
If you are age 77 or older, your body mass index should be between 23-30. Your Body mass index is 28.36 kg/m. If this is out of the aforementioned range listed, please consider follow up with your Primary Care Provider.  If you are age 58 or younger, your body mass index should be between 19-25. Your Body mass index is 28.36 kg/m. If this is out of the aformentioned range listed, please consider follow up with your Primary Care Provider.   __________________________________________________________  The [facility] providers would like to encourage you to use [facility] to communicate with providers for non-urgent requests or questions.  Due to long hold times on the telephone, sending your provider a message by [facility] may be a faster and more efficient way to get a response.  Please allow 48 business hours for a response.  Please remember that this is for non-urgent requests.   You have been scheduled for an endoscopy and colonoscopy. Please follow the written instructions given to you at your visit today. Please pick up your prep supplies at the pharmacy within the next 1-3 days. If you use inhalers (even only as needed), please bring them with you on the day of your procedure.   It was a pleasure to see you today!  Thank you for trusting me with your gastrointestinal care!

## 2021-02-14 NOTE — Progress Notes (Signed)
[facility] Consult Note:  History: Beth Macdonald 02/14/2021  Referring provider: [name], MD  Reason for consult/chief complaint: Abdominal Pain (Abd pain (periumbilical) started last year when patient stared baby ASA each morning.  Patient discontinued ASA on her own in Jan 2022 and feels that abd pain is better. Patient states that last week she was out of town and her "stomach pains" restarted. Patient notices stomach pains to be worse at night.) and Diarrhea ("Loose stools because I take metformin")   Subjective  HPI:  This is a pleasant 75 year old woman referred by primary care for abdominal pain.  She recalls it beginning at least a year ago, and describes it as a vague generalized "sick feeling".  It is a dull discomfort, not nausea, no vomiting and she cannot clearly find any triggers or relieving factors.  She initially thought it was because she was on aspirin, but stopped that almost 6 months ago with no change.  She had C. difficile twice in late 2020, early 2021, and says she was treated both times with antibiotics.  She had some diarrhea after COVID-vaccine last December so she had primary care check her stool again and was negative for C. difficile. She started metformin years ago, and has had diarrhea from that, but less so since it was changed to a different formulation last year.  She still will have a loose stool a few times a week, but does not consider that a problem or cause for concern.  She denies rectal bleeding or weight loss. Beth Macdonald notes that this abdominal discomfort is worse when she is laying down in the evening and sometimes just drinking water. She also reports a negative Cologuard test within the last few months.   ROS:  Review of Systems  Constitutional:  Positive for fatigue. Negative for appetite change and unexpected weight change.  HENT:  Negative for mouth sores and voice change.   Eyes:  Negative for pain and redness.   Respiratory:  Negative for cough and shortness of breath.   Cardiovascular:  Negative for chest pain and palpitations.  Genitourinary:  Negative for dysuria and hematuria.  Musculoskeletal:  Negative for arthralgias and myalgias.  Skin:  Negative for pallor and rash.  Neurological:  Negative for weakness and headaches.  Hematological:  Negative for adenopathy.    Past Medical History: Past Medical History:  Diagnosis Date   Aortic atherosclerosis (HCC)    Chronic neck pain    Diabetes mellitus without complication (HCC)    Type 2   Diabetic neuropathy (HCC)    DVT of proximal lower limb (HCC)    Facial paralysis/Bells palsy    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HCC)    Hyperlipidemia    Mitral valve insufficiency    Neck pain    Osteoarthritis    Skull fracture (HCC)    TIA (transient ischemic attack)    Varicose vein of leg      Past Surgical History: Past Surgical History:  Procedure Laterality Date   ELBOW SURGERY Bilateral    20 yrs ago   ENDOVENOUS ABLATION SAPHENOUS VEIN W/ LASER Bilateral    ENDOVENOUS ABLATION SAPHENOUS VEIN W/ LASER Left 11/19/2019   endovenous laser ablation anterior accessory branch of left GSV and stab phlebectomy> 20 incisions left leg by Gae Gallop MD    shoulder Bilateral    TOENAIL EXCISION Right 2019   Big toe   TONSILLECTOMY       Family History: Family History  Problem  Relation Age of Onset   Stroke Mother    Hypertension Father    Dementia Father    Leukemia Brother    Stroke Brother    Colon cancer Neg Hx    Esophageal cancer Neg Hx    Pancreatic cancer Neg Hx    Stomach cancer Neg Hx     Social History: Social History   Socioeconomic History   Marital status: Married    Spouse name: Not on file   Number of children: 0   Years of education: Not on file   Highest education level: Not on file  Occupational History   Not on file  Tobacco Use   Smoking status: Former    Packs/day: 1.00    Years: 20.00     Pack years: 20.00    Types: Cigarettes    Quit date: 1980    Years since quitting: 42.4   Smokeless tobacco: Never  Vaping Use   Vaping Use: Never used  Substance and Sexual Activity   Alcohol use: Yes    Comment: social/rarely   Drug use: Never   Sexual activity: Yes  Other Topics Concern   Not on file  Social History Narrative   Not on file   Social Determinants of Health   Financial Resource Strain: Not on file  Food Insecurity: Not on file  Transportation Needs: Not on file  Physical Activity: Not on file  Stress: Not on file  Social Connections: Not on file    Allergies: Allergies  Allergen Reactions   Clindamycin/Lincomycin     Cannot tolerate mycins causes C-Diff   Tetracyclines & Related Other (See Comments)    "Feel bad"    Outpatient Meds: Current Outpatient Medications  Medication Sig Dispense Refill   alclomethasone (ACLOVATE) 0.05 % cream Apply topically 2 (two) times daily. 60 g 1   alendronate (FOSAMAX) 70 MG tablet Take 70 mg by mouth once a week.     atorvastatin (LIPITOR) 40 MG tablet Take 40 mg by mouth daily.      Calcium Carbonate Antacid (TUMS PO) Take by mouth as needed.     cholecalciferol (VITAMIN D3) 25 MCG (1000 UT) tablet Take 1,000 Units by mouth daily.     Clobetasol Prop Emollient Base (CLOBETASOL PROPIONATE E) 0.05 % emollient cream Apply 1 application topically 2 (two) times daily. Apply as directed 60 g 2   cycloSPORINE (RESTASIS) 0.05 % ophthalmic emulsion 1 drop 2 (two) times daily.     Evolocumab with Infusor ([place]) 420 MG/3.5ML SOCT Inject into the skin every 14 (fourteen) days.     ibuprofen (ADVIL) 800 MG tablet as needed.     lisinopril (ZESTRIL) 2.5 MG tablet Take 2.5 mg by mouth daily.     MAGNESIUM PO Take 400 mcg by mouth daily.      metFORMIN (GLUCOPHAGE) 500 MG tablet INCREASE TO 2 TABLETS TWICE A DAY AS DISCUSSED ORALLY 90 DAYS     OMEGA-3 FATTY ACIDS PO Take 1,000 mcg by mouth daily.       PEG-KCl-NaCl-NaSulf-Na Asc-C (PLENVU) 140 g SOLR Take 140 g by mouth as directed. Manufacturer's coupon Universal coupon code:BIN: P2366821; GROUP: [id number]; PCN: [id number]; ID: [id number]; PAY NO MORE $50 1 each 0   No current facility-administered medications for this visit.      ___________________________________________________________________ Objective   Exam:  BP 124/68 (BP Location: Left Arm, Patient Position: Sitting, Cuff Size: Normal)   Pulse 76   Ht 5\' 8"  (1.727 m)  Wt 186 lb 8 oz (84.6 kg)   SpO2 98%   BMI 28.36 kg/m  Wt Readings from Last 3 Encounters:  02/14/21 186 lb 8 oz (84.6 kg)  03/16/20 179 lb 3.2 oz (81.3 kg)  11/19/19 179 lb (81.2 kg)    General: Well-appearing, no muscle wasting Eyes: sclera anicteric, no redness ENT: oral mucosa moist without lesions, no cervical or supraclavicular lymphadenopathy CV: RRR without murmur, S1/S2, no JVD, no peripheral edema Resp: clear to auscultation bilaterally, normal RR and effort noted GI: soft, no tenderness, with active bowel sounds. No guarding or palpable organomegaly noted.  No bruit Skin; warm and dry, no rash or jaundice noted Neuro: awake, alert and oriented x 3. Normal gross motor function and fluent speech  Labs:  PCP labs 12/07/2020:  CBC normal, BMP normal except glucose 128 Albumin and hepatic function panel normal Hemoglobin A1c 7.2  GI pathogen panel - 08/08/2020  Radiologic Studies:  No abdominal imaging  Assessment: Encounter Diagnoses  Name Primary?   Generalized abdominal pain Yes   Chronic diarrhea     Over a year of abdominal discomfort that is difficult to characterize.  Loose stool a few times a week, though still possibly related to metformin.  Must consider colitis including microscopic colitis.  Does not seem to have typical risk factors for SIBO or EPI. Unknown why it bothers her more when laying down in the evening or drinking cold water, must consider functional  element. Plan:  Upper endoscopy and colonoscopy.  Evaluate for H. pylori, celiac sprue, microscopic colitis.  She was agreeable after discussion of procedure and risks  The benefits and risks of the planned procedure were described in detail with the patient or (when appropriate) their health care proxy.  Risks were outlined as including, but not limited to, bleeding, infection, perforation, adverse medication reaction leading to cardiac or pulmonary decompensation, pancreatitis (if ERCP).  The limitation of incomplete mucosal visualization was also discussed.  No guarantees or warranties were given.   Thank you for the courtesy of this consult.  Please call me with any questions or concerns.  Nelida Meuse III  CC: Referring provider noted above

## 2021-02-17 DIAGNOSIS — M542 Cervicalgia: Secondary | ICD-10-CM | POA: Diagnosis not present

## 2021-02-17 DIAGNOSIS — M47896 Other spondylosis, lumbar region: Secondary | ICD-10-CM | POA: Diagnosis not present

## 2021-02-17 DIAGNOSIS — H8111 Benign paroxysmal vertigo, right ear: Secondary | ICD-10-CM | POA: Diagnosis not present

## 2021-03-08 ENCOUNTER — Ambulatory Visit: Payer: MEDICARE | Admitting: Physician Assistant

## 2021-03-13 ENCOUNTER — Other Ambulatory Visit: Payer: MEDICARE

## 2021-03-13 DIAGNOSIS — E118 Type 2 diabetes mellitus with unspecified complications: Secondary | ICD-10-CM | POA: Diagnosis not present

## 2021-03-13 DIAGNOSIS — E782 Mixed hyperlipidemia: Secondary | ICD-10-CM | POA: Diagnosis not present

## 2021-03-15 DIAGNOSIS — R809 Proteinuria, unspecified: Secondary | ICD-10-CM | POA: Diagnosis not present

## 2021-03-15 DIAGNOSIS — I700 Atherosclerosis of aorta: Secondary | ICD-10-CM | POA: Diagnosis not present

## 2021-03-15 DIAGNOSIS — I10 Essential (primary) hypertension: Secondary | ICD-10-CM | POA: Diagnosis not present

## 2021-03-15 DIAGNOSIS — E1129 Type 2 diabetes mellitus with other diabetic kidney complication: Secondary | ICD-10-CM | POA: Diagnosis not present

## 2021-03-15 DIAGNOSIS — E782 Mixed hyperlipidemia: Secondary | ICD-10-CM | POA: Diagnosis not present

## 2021-03-15 DIAGNOSIS — Z8673 Personal history of transient ischemic attack (TIA), and cerebral infarction without residual deficits: Secondary | ICD-10-CM | POA: Diagnosis not present

## 2021-03-20 ENCOUNTER — Other Ambulatory Visit: Payer: Self-pay

## 2021-03-20 ENCOUNTER — Telehealth: Payer: Self-pay | Admitting: Gastroenterology

## 2021-03-20 MED ORDER — PLENVU 140 G PO SOLR: 140.0000 g | ORAL | 0 refills | Status: DC

## 2021-03-20 NOTE — Telephone Encounter (Signed)
Rx has been re sent to the [facility] on file. I also activated the Medicare coupon and faxed to the pharmacy. Pt is aware of this plan.

## 2021-03-20 NOTE — Telephone Encounter (Signed)
Inbound call from patient about procedure 7/26. States she need prior authorization for prep plenvu.

## 2021-03-22 ENCOUNTER — Ambulatory Visit (INDEPENDENT_AMBULATORY_CARE_PROVIDER_SITE_OTHER): Payer: MEDICARE | Admitting: Physician Assistant

## 2021-03-22 ENCOUNTER — Encounter: Payer: Self-pay | Admitting: Physician Assistant

## 2021-03-22 ENCOUNTER — Other Ambulatory Visit: Payer: Self-pay

## 2021-03-22 DIAGNOSIS — D0439 Carcinoma in situ of skin of other parts of face: Secondary | ICD-10-CM

## 2021-03-22 DIAGNOSIS — Z1283 Encounter for screening for malignant neoplasm of skin: Secondary | ICD-10-CM

## 2021-03-22 DIAGNOSIS — L239 Allergic contact dermatitis, unspecified cause: Secondary | ICD-10-CM | POA: Diagnosis not present

## 2021-03-22 DIAGNOSIS — Z85828 Personal history of other malignant neoplasm of skin: Secondary | ICD-10-CM | POA: Diagnosis not present

## 2021-03-22 DIAGNOSIS — Z872 Personal history of diseases of the skin and subcutaneous tissue: Secondary | ICD-10-CM | POA: Diagnosis not present

## 2021-03-22 DIAGNOSIS — C4492 Squamous cell carcinoma of skin, unspecified: Secondary | ICD-10-CM

## 2021-03-22 DIAGNOSIS — D485 Neoplasm of uncertain behavior of skin: Secondary | ICD-10-CM

## 2021-03-22 HISTORY — DX: Squamous cell carcinoma of skin, unspecified: C44.92

## 2021-03-22 MED ORDER — DESONIDE 0.05 % EX CREA: TOPICAL_CREAM | Freq: Two times a day (BID) | CUTANEOUS | 11 refills | Status: DC | PRN

## 2021-03-22 MED ORDER — CLOBETASOL PROPIONATE 0.05 % EX CREA: 1.0000 "application " | TOPICAL_CREAM | Freq: Two times a day (BID) | CUTANEOUS | 10 refills | Status: DC

## 2021-03-22 NOTE — Progress Notes (Signed)
   Follow-Up Visit   Subjective  Beth Macdonald is a 75 y.o. female who presents for the following: Annual Exam (Here for annual skin exam. Concerns nose had a BCC she wants checked. AK history in the paper chart ).  She gets a lot of bites and skin irritation in the summer from being outside.    The following portions of the chart were reviewed this encounter and updated as appropriate:  Tobacco  Allergies  Meds  Problems  Med Hx  Surg Hx  Fam Hx      Objective  Well appearing patient in no apparent distress; mood and affect are within normal limits.  A full examination was performed including scalp, head, eyes, ears, nose, lips, neck, chest, axillae, abdomen, back, buttocks, bilateral upper extremities, bilateral lower extremities, hands, feet, fingers, toes, fingernails, and toenails. All findings within normal limits unless otherwise noted below.  bridge of nose Hyperkeratotic scale with pink base      Right Forehead Hyperkeratotic scale with pink base      Head - Anterior (Face), Left Lower Leg - Anterior, Right Abdomen (side) - Lower Pink papules and plaques.   Assessment & Plan  Neoplasm of uncertain behavior of skin (2) bridge of nose  Skin / nail biopsy Type of biopsy: tangential   Informed consent: discussed and consent obtained   Timeout: patient name, date of birth, surgical site, and procedure verified   Anesthesia: the lesion was anesthetized in a standard fashion   Anesthetic:  1% lidocaine w/ epinephrine 1-100,000 local infiltration Instrument used: flexible razor blade   Hemostasis achieved with: ferric subsulfate   Outcome: patient tolerated procedure well   Post-procedure details: wound care instructions given    Specimen 2 - Surgical pathology Differential Diagnosis: scc vs bcc  Check Margins: No  Right Forehead  Skin / nail biopsy Type of biopsy: tangential   Informed consent: discussed and consent obtained   Timeout: patient  name, date of birth, surgical site, and procedure verified   Anesthesia: the lesion was anesthetized in a standard fashion   Anesthetic:  1% lidocaine w/ epinephrine 1-100,000 local infiltration Instrument used: flexible razor blade   Hemostasis achieved with: ferric subsulfate   Outcome: patient tolerated procedure well   Post-procedure details: wound care instructions given    Specimen 1 - Surgical pathology Differential Diagnosis: scc vs bcc  Check Margins: No  Allergic dermatitis Head - Anterior (Face); Left Lower Leg - Anterior; Right Abdomen (side) - Lower  clobetasol cream (TEMOVATE) 0.05 % - Left Lower Leg - Anterior, Right Abdomen (side) - Lower Apply 1 application topically 2 (two) times daily.  desonide (DESOWEN) 0.05 % cream - Head - Anterior (Face) Apply topically 2 (two) times daily as needed (Rash).    I, Teola Felipe, PA-C, have reviewed all documentation's for this visit.  The documentation on 03/22/21 for the exam, diagnosis, procedures and orders are all accurate and complete.

## 2021-03-22 NOTE — Patient Instructions (Signed)

## 2021-03-24 ENCOUNTER — Ambulatory Visit: Payer: MEDICARE | Admitting: Cardiology

## 2021-03-27 ENCOUNTER — Encounter: Payer: Self-pay | Admitting: Certified Registered Nurse Anesthetist

## 2021-03-28 ENCOUNTER — Ambulatory Visit (AMBULATORY_SURGERY_CENTER): Payer: MEDICARE | Admitting: Gastroenterology

## 2021-03-28 ENCOUNTER — Encounter: Payer: Self-pay | Admitting: Gastroenterology

## 2021-03-28 ENCOUNTER — Other Ambulatory Visit: Payer: Self-pay

## 2021-03-28 VITALS — BP 155/79 | HR 67 | Temp 96.9°F | Resp 20 | Ht 68.0 in | Wt 186.0 lb

## 2021-03-28 DIAGNOSIS — R1084 Generalized abdominal pain: Secondary | ICD-10-CM | POA: Diagnosis not present

## 2021-03-28 DIAGNOSIS — K529 Noninfective gastroenteritis and colitis, unspecified: Secondary | ICD-10-CM | POA: Diagnosis not present

## 2021-03-28 DIAGNOSIS — R109 Unspecified abdominal pain: Secondary | ICD-10-CM | POA: Diagnosis not present

## 2021-03-28 DIAGNOSIS — I341 Nonrheumatic mitral (valve) prolapse: Secondary | ICD-10-CM | POA: Diagnosis not present

## 2021-03-28 DIAGNOSIS — R197 Diarrhea, unspecified: Secondary | ICD-10-CM | POA: Diagnosis not present

## 2021-03-28 DIAGNOSIS — E119 Type 2 diabetes mellitus without complications: Secondary | ICD-10-CM | POA: Diagnosis not present

## 2021-03-28 MED ORDER — SODIUM CHLORIDE 0.9 % IV SOLN: 500.0000 mL | Freq: Once | INTRAVENOUS | Status: DC

## 2021-03-28 NOTE — Progress Notes (Signed)
Called to room to assist during endoscopic procedure.  Patient ID and intended procedure confirmed with present staff. Received instructions for my participation in the procedure from the performing physician.  

## 2021-03-28 NOTE — Op Note (Signed)
[name] Patient Name: Beth Macdonald Procedure Date: 03/28/2021 2:53 PM MRN: [id number] Endoscopist: Mallie Mussel L. Loletha Carrow , MD Age: 75 Referring MD:  Date of Birth: Jun 21, 1946 Gender: Female Account #: [id number] Procedure:                Upper GI endoscopy Indications:              Generalized abdominal pain, Diarrhea Medicines:                Monitored Anesthesia Care Procedure:                Pre-Anesthesia Assessment:                           - Prior to the procedure, a History and Physical                            was performed, and patient medications and                            allergies were reviewed. The patient's tolerance of                            previous anesthesia was also reviewed. The risks                            and benefits of the procedure and the sedation                            options and risks were discussed with the patient.                            All questions were answered, and informed consent                            was obtained. Prior Anticoagulants: The patient has                            taken no previous anticoagulant or antiplatelet                            agents. ASA Grade Assessment: III - A patient with                            severe systemic disease. After reviewing the risks                            and benefits, the patient was deemed in                            satisfactory condition to undergo the procedure.                           - Prior to the procedure, a History and Physical  was performed, and patient medications and                            allergies were reviewed. The patient's tolerance of                            previous anesthesia was also reviewed. The risks                            and benefits of the procedure and the sedation                            options and risks were discussed with the patient.                            All questions were  answered, and informed consent                            was obtained. Prior Anticoagulants: The patient has                            taken no previous anticoagulant or antiplatelet                            agents. ASA Grade Assessment: III - A patient with                            severe systemic disease. After reviewing the risks                            and benefits, the patient was deemed in                            satisfactory condition to undergo the procedure.                           After obtaining informed consent, the endoscope was                            passed under direct vision. Throughout the                            procedure, the patient's blood pressure, pulse, and                            oxygen saturations were monitored continuously. The                            GIF [id number] was introduced through the                            mouth, and advanced to the second part of duodenum.  The upper GI endoscopy was accomplished without                            difficulty. The patient tolerated the procedure                            well. Scope In: Scope Out: Findings:                 The larynx was normal.                           The esophagus was normal.                           The entire examined stomach was normal. Biopsies                            were taken with a cold forceps for Helicobacter                            pylori testing using CLOtest.                           The cardia and gastric fundus were normal on                            retroflexion.                           The examined duodenum was normal. Biopsies for                            histology were taken with a cold forceps for                            evaluation of celiac disease. Complications:            No immediate complications. Estimated Blood Loss:     Estimated blood loss was minimal. Impression:               - Normal  larynx.                           - Normal esophagus.                           - Normal stomach. Biopsied.                           - Normal examined duodenum. Biopsied. Recommendation:           - Patient has a contact number available for                            emergencies. The signs and symptoms of potential                            delayed  complications were discussed with the                            patient. Return to normal activities tomorrow.                            Written discharge instructions were provided to the                            patient.                           - Resume previous diet.                           - Continue present medications.                           - Await pathology results.                           - See the other procedure note for documentation of                            additional recommendations. Milka Windholz L. Loletha Carrow, MD 03/28/2021 3:09:24 PM This report has been signed electronically.

## 2021-03-28 NOTE — Progress Notes (Signed)
1430 Robinul 0.1 mg IV given due large amount of secretions upon assessment.  MD made aware, vss

## 2021-03-28 NOTE — Addendum Note (Signed)
Addended by: Randall Hiss B on: 03/28/2021 04:06 PM   Modules accepted: Orders

## 2021-03-28 NOTE — Op Note (Signed)
[facility] Patient Name: Beth Macdonald Procedure Date: 03/28/2021 2:21 PM MRN: [id number] Endoscopist: Mallie Mussel L. Loletha Carrow , MD Age: 75 Referring MD:  Date of Birth: 07/09/1946 Gender: Female Account #: [id number] Procedure:                Colonoscopy Indications:              Generalized abdominal pain, Chronic diarrhea (both                            intermittent, see recent clinic note for details) Medicines:                Monitored Anesthesia Care Procedure:                Pre-Anesthesia Assessment:                           - Prior to the procedure, a History and Physical                            was performed, and patient medications and                            allergies were reviewed. The patient's tolerance of                            previous anesthesia was also reviewed. The risks                            and benefits of the procedure and the sedation                            options and risks were discussed with the patient.                            All questions were answered, and informed consent                            was obtained. Prior Anticoagulants: The patient has                            taken no previous anticoagulant or antiplatelet                            agents. ASA Grade Assessment: III - A patient with                            severe systemic disease. After reviewing the risks                            and benefits, the patient was deemed in                            satisfactory condition to undergo the procedure.  After obtaining informed consent, the colonoscope                            was passed under direct vision. Throughout the                            procedure, the patient's blood pressure, pulse, and                            oxygen saturations were monitored continuously. The                            Olympus CF-HQ190L (Serial# [id number]) Colonoscope was                             introduced through the anus and advanced to the the                            terminal ileum, with identification of the                            appendiceal orifice and IC valve. The colonoscopy                            was performed without difficulty. The patient                            tolerated the procedure well. The quality of the                            bowel preparation was excellent. The ileocecal                            valve, appendiceal orifice, and rectum were                            photographed. The bowel preparation used was Plenvu. Scope In: 2:39:20 PM Scope Out: 2:51:47 PM Scope Withdrawal Time: 0 hours 8 minutes 26 seconds  Total Procedure Duration: 0 hours 12 minutes 27 seconds  Findings:                 The perianal and digital rectal examinations were                            normal.                           The terminal ileum appeared normal.                           Normal mucosa was found in the entire colon.                            Biopsies for histology were taken with a cold  forceps from the ascending colon, transverse colon                            and sigmoid colon for evaluation of microscopic                            colitis.                           There is no endoscopic evidence of polyps in the                            entire colon.                           The exam was otherwise without abnormality on                            direct and retroflexion views. Complications:            No immediate complications. Estimated Blood Loss:     Estimated blood loss was minimal. Impression:               - The examined portion of the ileum was normal.                           - Normal mucosa in the entire examined colon.                            Biopsied.                           - The examination was otherwise normal on direct                            and retroflexion views. Recommendation:            - Patient has a contact number available for                            emergencies. The signs and symptoms of potential                            delayed complications were discussed with the                            patient. Return to normal activities tomorrow.                            Written discharge instructions were provided to the                            patient.                           - Resume previous diet.                           -  Continue present medications.                           - Await pathology results.                           - No repeat routine screening colonoscopy due to                            age and current guidelines.                           - See the other procedure note for documentation of                            additional recommendations. Casara Perrier L. Loletha Carrow, MD 03/28/2021 3:05:55 PM This report has been signed electronically.

## 2021-03-28 NOTE — Progress Notes (Signed)
Report given to PACU, vss 

## 2021-03-28 NOTE — Patient Instructions (Signed)
YOU HAD AN ENDOSCOPIC PROCEDURE TODAY AT [facility]:   Refer to the procedure report that was given to you for any specific questions about what was found during the examination.  If the procedure report does not answer your questions, please call your gastroenterologist to clarify.  If you requested that your care partner not be given the details of your procedure findings, then the procedure report has been included in a sealed envelope for you to review at your convenience later.  YOU SHOULD EXPECT: Some feelings of bloating in the abdomen. Passage of more gas than usual.  Walking can help get rid of the air that was put into your GI tract during the procedure and reduce the bloating. If you had a lower endoscopy (such as a colonoscopy or flexible sigmoidoscopy) you may notice spotting of blood in your stool or on the toilet paper. If you underwent a bowel prep for your procedure, you may not have a normal bowel movement for a few days.  Please Note:  You might notice some irritation and congestion in your nose or some drainage.  This is from the oxygen used during your procedure.  There is no need for concern and it should clear up in a day or so.  SYMPTOMS TO REPORT IMMEDIATELY:  Following lower endoscopy (colonoscopy or flexible sigmoidoscopy):  Excessive amounts of blood in the stool  Significant tenderness or worsening of abdominal pains  Swelling of the abdomen that is new, acute  Fever of 100F or higher  Following upper endoscopy (EGD)  Vomiting of blood or coffee ground material  New chest pain or pain under the shoulder blades  Painful or persistently difficult swallowing  New shortness of breath  Fever of 100F or higher  Black, tarry-looking stools  For urgent or emergent issues, a gastroenterologist can be reached at any hour by calling [phone]. Do not use MyChart messaging for urgent concerns.    DIET:  We do recommend a small meal at first, but  then you may proceed to your regular diet.  Drink plenty of fluids but you should avoid alcoholic beverages for 24 hours.  MEDICATIONS: Continue present medications.  Please see handouts given to you by your recovery nurse.  Thank you for allowing [place] to provide for your healthcare needs today.  ACTIVITY:  You should plan to take it easy for the rest of today and you should NOT DRIVE or use heavy machinery until tomorrow (because of the sedation medicines used during the test).    FOLLOW UP: Our staff will call the number listed on your records 48-72 hours following your procedure to check on you and address any questions or concerns that you may have regarding the information given to you following your procedure. If we do not reach you, we will leave a message.  We will attempt to reach you two times.  During this call, we will ask if you have developed any symptoms of COVID 19. If you develop any symptoms (ie: fever, flu-like symptoms, shortness of breath, cough etc.) before then, please call [phone].  If you test positive for Covid 19 in the 2 weeks post procedure, please call and report this information to [place].    If any biopsies were taken you will be contacted by phone or by letter within the next 1-3 weeks.  Please call us at [phone] if you have not heard about the biopsies in 3 weeks.    SIGNATURES/CONFIDENTIALITY: You and/or your  care partner have signed paperwork which will be entered into your electronic medical record.  These signatures attest to the fact that that the information above on your After Visit Summary has been reviewed and is understood.  Full responsibility of the confidentiality of this discharge information lies with you and/or your care-partner.

## 2021-03-28 NOTE — Progress Notes (Signed)
Medical history reviewed with no changes noted. VS assessed by C.W 

## 2021-03-29 LAB — HELICOBACTER PYLORI SCREEN-BIOPSY: UREASE: NEGATIVE

## 2021-03-30 ENCOUNTER — Telehealth: Payer: Self-pay | Admitting: *Deleted

## 2021-03-30 NOTE — Telephone Encounter (Signed)
  Follow up Call-  Call back number 03/28/2021  Post procedure Call Back phone  # [phone]  Permission to leave phone message Yes  Some recent data might be hidden     Patient questions:  Do you have a fever, pain , or abdominal swelling? No. Pain Score  0 *  Have you tolerated food without any problems? Yes.    Have you been able to return to your normal activities? Yes.    Do you have any questions about your discharge instructions: Diet   No. Medications  No. Follow up visit  No.  Do you have questions or concerns about your Care? No.  Actions: * If pain score is 4 or above: No action needed, pain <4.  Have you developed a fever since your procedure? no  2.   Have you had an respiratory symptoms (SOB or cough) since your procedure? no  3.   Have you tested positive for COVID 19 since your procedure no  4.   Have you had any family members/close contacts diagnosed with the COVID 19 since your procedure?  no   If yes to any of these questions please route to Joylene John, RN and Joella Prince, RN

## 2021-04-06 ENCOUNTER — Telehealth: Payer: Self-pay

## 2021-04-06 NOTE — Telephone Encounter (Signed)
Phone call to patient with her pathology results. Voicemail left for patient to give the office a call back.  

## 2021-04-06 NOTE — Telephone Encounter (Signed)
-----   Message from Warren Danes, [place] sent at 04/05/2021  8:57 AM EDT ----- 30

## 2021-04-12 NOTE — Telephone Encounter (Signed)
Next available surgery date made in epic with [facility] [place] x2 attempts made for results patient has mychart

## 2021-04-12 NOTE — Telephone Encounter (Signed)
-----   Message from Warren Danes, [place] sent at 04/05/2021  8:57 AM EDT ----- 30

## 2021-04-20 ENCOUNTER — Ambulatory Visit (INDEPENDENT_AMBULATORY_CARE_PROVIDER_SITE_OTHER): Payer: MEDICARE | Admitting: Physician Assistant

## 2021-04-20 ENCOUNTER — Other Ambulatory Visit: Payer: Self-pay

## 2021-04-20 DIAGNOSIS — D0430 Carcinoma in situ of skin of unspecified part of face: Secondary | ICD-10-CM

## 2021-04-20 DIAGNOSIS — D0439 Carcinoma in situ of skin of other parts of face: Secondary | ICD-10-CM

## 2021-04-20 DIAGNOSIS — D099 Carcinoma in situ, unspecified: Secondary | ICD-10-CM

## 2021-04-20 NOTE — Patient Instructions (Signed)

## 2021-04-26 DIAGNOSIS — Z01419 Encounter for gynecological examination (general) (routine) without abnormal findings: Secondary | ICD-10-CM | POA: Diagnosis not present

## 2021-04-26 DIAGNOSIS — M85852 Other specified disorders of bone density and structure, left thigh: Secondary | ICD-10-CM | POA: Diagnosis not present

## 2021-04-26 DIAGNOSIS — M85851 Other specified disorders of bone density and structure, right thigh: Secondary | ICD-10-CM | POA: Diagnosis not present

## 2021-05-03 ENCOUNTER — Encounter: Payer: Self-pay | Admitting: Physician Assistant

## 2021-05-03 NOTE — Progress Notes (Signed)
   Follow-Up Visit   Subjective  Beth Macdonald is a 75 y.o. female who presents for the following: Procedure (Here to treat CIS x 2 forehead and nose. ).   The following portions of the chart were reviewed this encounter and updated as appropriate:  Tobacco  Allergies  Meds  Problems  Med Hx  Surg Hx  Fam Hx      Objective  Well appearing patient in no apparent distress; mood and affect are within normal limits.  A focused examination was performed including face. Relevant physical exam findings are noted in the Assessment and Plan.  Right Forehead Pink macule  bridge of the nose Pink macule  Assessment & Plan  Carcinoma in situ of skin of face, unspecified location (2) Right Forehead  Destruction of lesion Complexity: simple   Destruction method: electrodesiccation and curettage   Informed consent: discussed and consent obtained   Timeout:  patient name, date of birth, surgical site, and procedure verified Anesthesia: the lesion was anesthetized in a standard fashion   Anesthetic:  1% lidocaine w/ epinephrine 1-100,000 local infiltration Curettage performed in three different directions: Yes   Electrodesiccation performed over the curetted area: Yes   Curettage cycles:  3 Final wound size (cm):  1.7 Hemostasis achieved with:  ferric subsulfate Outcome: patient tolerated procedure well with no complications   Additional details:  Wound innoculated with 5 fluorouracil solution.  bridge of the nose  Destruction of lesion Complexity: simple   Destruction method: electrodesiccation and curettage   Informed consent: discussed and consent obtained   Timeout:  patient name, date of birth, surgical site, and procedure verified Anesthesia: the lesion was anesthetized in a standard fashion   Curettage performed in three different directions: Yes   Electrodesiccation performed over the curetted area: Yes   Curettage cycles:  3 Final wound size (cm):  1.2 Hemostasis  achieved with:  ferric subsulfate Outcome: patient tolerated procedure well with no complications   Additional details:  Wound innoculated with 5 fluorouracil solution.   I, Renaud Celli, PA-C, have reviewed all documentation's for this visit.  The documentation on 05/03/21 for the exam, diagnosis, procedures and orders are all accurate and complete.

## 2021-06-05 DIAGNOSIS — Z8673 Personal history of transient ischemic attack (TIA), and cerebral infarction without residual deficits: Secondary | ICD-10-CM | POA: Diagnosis not present

## 2021-06-05 DIAGNOSIS — E782 Mixed hyperlipidemia: Secondary | ICD-10-CM | POA: Diagnosis not present

## 2021-06-05 DIAGNOSIS — I700 Atherosclerosis of aorta: Secondary | ICD-10-CM | POA: Diagnosis not present

## 2021-06-05 DIAGNOSIS — I10 Essential (primary) hypertension: Secondary | ICD-10-CM | POA: Diagnosis not present

## 2021-06-05 DIAGNOSIS — R5383 Other fatigue: Secondary | ICD-10-CM | POA: Diagnosis not present

## 2021-06-05 DIAGNOSIS — E1129 Type 2 diabetes mellitus with other diabetic kidney complication: Secondary | ICD-10-CM | POA: Diagnosis not present

## 2021-06-07 DIAGNOSIS — E1129 Type 2 diabetes mellitus with other diabetic kidney complication: Secondary | ICD-10-CM | POA: Diagnosis not present

## 2021-06-07 DIAGNOSIS — I10 Essential (primary) hypertension: Secondary | ICD-10-CM | POA: Diagnosis not present

## 2021-06-07 DIAGNOSIS — E782 Mixed hyperlipidemia: Secondary | ICD-10-CM | POA: Diagnosis not present

## 2021-06-07 DIAGNOSIS — R809 Proteinuria, unspecified: Secondary | ICD-10-CM | POA: Diagnosis not present

## 2021-06-07 DIAGNOSIS — Z23 Encounter for immunization: Secondary | ICD-10-CM | POA: Diagnosis not present

## 2021-06-07 DIAGNOSIS — Z8673 Personal history of transient ischemic attack (TIA), and cerebral infarction without residual deficits: Secondary | ICD-10-CM | POA: Diagnosis not present

## 2021-06-07 DIAGNOSIS — I700 Atherosclerosis of aorta: Secondary | ICD-10-CM | POA: Diagnosis not present

## 2021-06-22 ENCOUNTER — Ambulatory Visit (INDEPENDENT_AMBULATORY_CARE_PROVIDER_SITE_OTHER): Payer: MEDICARE | Admitting: Sports Medicine

## 2021-06-22 ENCOUNTER — Ambulatory Visit: Payer: Self-pay

## 2021-06-22 VITALS — BP 126/84 | Ht 68.25 in | Wt 178.0 lb

## 2021-06-22 DIAGNOSIS — M1711 Unilateral primary osteoarthritis, right knee: Secondary | ICD-10-CM

## 2021-06-22 MED ORDER — METHYLPREDNISOLONE ACETATE 40 MG/ML IJ SUSP
40.0000 mg | Freq: Once | INTRAMUSCULAR | Status: AC
  Administered 2021-06-22: 40 mg via INTRA_ARTICULAR

## 2021-06-22 NOTE — Assessment & Plan Note (Signed)
Today we injected her parameniscal cyst She will take a few days of limited activity and then gradually return to her normal activity If she gets significant pain relief she may want to continue with conservative care However with her longstanding arthritis and her significant valgus shift I think she should go ahead and see Dr. Wynelle Link for his opinion about total knee replacement We will make that referral and let the patient know

## 2021-06-22 NOTE — Progress Notes (Signed)
Patient comes in for right knee pain  Patient has seen me for the past 10 years for some osteoarthritis of her right knee Back in 2014 she had more trouble with her left knee but that gradually resolved with a home exercise program that we initiated as a study with physical therapy That knee continued to do well  The right knee started flaring in 2018 Initially some cortisone injections did give her some significant relief along with her water exercise program and home strengthening exercises  She is concerned now that the knee is getting worse and is starting to limit her from wanting to walk very much She continues to do 2 hours of water aerobics twice weekly She continues doing some strength exercises for her quadriceps and hips  Review of systems The right knee feels swollen at times The knee does not lock or give out but hurts with too much bending  Physical exam Pleasant older female in no acute distress BP 126/84   Ht 5' 8.25" (1.734 m)   Wt 178 lb (80.7 kg)   BMI 26.87 kg/m  [place] Adult Exercise 06/22/2021  Frequency of aerobic exercise (# of days/week) 2  Average time in minutes > 90  Frequency of strengthening activities (# of days/week) 2   Standing examination shows about 15 to 20 degrees of valgus shift of her right knee She lacks a few degrees of full extension on the right She has good flexion to about 135 degrees on the right On palpation she has tenderness over the lateral joint line The medial joint line does not have significant tenderness There is some puffiness around the lateral aspect of the suprapatellar pouch  Right knee ultrasound The patient has only a very small amount of fluid in the suprapatellar pouch There is some spurring at the top of the patella Medial joint line shows some narrowing and some slight increase in hypoechoic fluid along the joint line There is 1 area of medial spurring Lateral joint line shows significant  spurring There is a significantly enlarged parameniscal cyst that is tender to palpation with the probe This has calcifications Lateral joint line spurring and the joint line is very narrow  Impression osteoarthritis of the right knee with degenerative meniscus changes and a large lateral parameniscal cyst  Ultrasound-guided injection Procedure:  Injection of right parameniscal cyst Consent obtained and verified. Time-out conducted. Noted no overlying erythema, induration, or other signs of local infection. Skin prepped in a sterile fashion. Topical analgesic spray: Ethyl chloride. Completed without difficulty.  I visualize the needle entering the cystic area and the injection was completed without difficulty Meds: 1 cc of Solu-Medrol 40 and 2 cc of 1% lidocaine Advised to call if fevers/chills, erythema, induration, drainage, or persistent bleeding.  Ultrasound, injection and interpretation by Wolfgang Phoenix. Oneida Alar, MD

## 2021-07-03 ENCOUNTER — Other Ambulatory Visit: Payer: Self-pay | Admitting: Internal Medicine

## 2021-07-03 DIAGNOSIS — Z1231 Encounter for screening mammogram for malignant neoplasm of breast: Secondary | ICD-10-CM

## 2021-07-18 ENCOUNTER — Ambulatory Visit: Payer: Self-pay

## 2021-07-18 ENCOUNTER — Other Ambulatory Visit (HOSPITAL_BASED_OUTPATIENT_CLINIC_OR_DEPARTMENT_OTHER): Payer: Self-pay

## 2021-07-18 ENCOUNTER — Ambulatory Visit: Payer: MEDICARE | Attending: Internal Medicine

## 2021-07-18 DIAGNOSIS — Z23 Encounter for immunization: Secondary | ICD-10-CM

## 2021-07-18 MED ORDER — PFIZER COVID-19 VAC BIVALENT 30 MCG/0.3ML IM SUSP
INTRAMUSCULAR | 0 refills | Status: DC
  Filled 2021-07-18: qty 0.3, 1d supply, fill #0

## 2021-07-18 NOTE — Progress Notes (Signed)
   Covid-19 Vaccination Clinic  Name:  EMERA BUSSIE    MRN: [id number] DOB: Jan 20, 1946  07/18/2021  Ms. Gautney was observed post Covid-19 immunization for 15 minutes without incident. She was provided with Vaccine Information Sheet and instruction to access the V-Safe system.   Ms. Meas was instructed to call 911 with any severe reactions post vaccine: Difficulty breathing  Swelling of face and throat  A fast heartbeat  A bad rash all over body  Dizziness and weakness   Immunizations Administered     Name Date Dose VIS Date Route   Pfizer Covid-19 Vaccine Bivalent Booster 07/18/2021 10:47 AM 0.3 mL 05/03/2021 Intramuscular   Manufacturer: [place]   Lot: [id number]   [place]: [phone]

## 2021-08-09 ENCOUNTER — Ambulatory Visit
Admission: RE | Admit: 2021-08-09 | Discharge: 2021-08-09 | Disposition: A | Discharge status: Home / Self Care | Payer: MEDICARE | Source: Ambulatory Visit | Attending: Internal Medicine | Admitting: Internal Medicine

## 2021-08-09 DIAGNOSIS — Z1231 Encounter for screening mammogram for malignant neoplasm of breast: Secondary | ICD-10-CM

## 2021-08-16 ENCOUNTER — Encounter: Payer: Self-pay | Admitting: Physician Assistant

## 2021-08-16 ENCOUNTER — Ambulatory Visit (INDEPENDENT_AMBULATORY_CARE_PROVIDER_SITE_OTHER): Payer: MEDICARE | Admitting: Physician Assistant

## 2021-08-16 ENCOUNTER — Other Ambulatory Visit: Payer: Self-pay

## 2021-08-16 DIAGNOSIS — Z86007 Personal history of in-situ neoplasm of skin: Secondary | ICD-10-CM | POA: Diagnosis not present

## 2021-08-16 NOTE — Progress Notes (Signed)
° °  Follow-Up Visit   Subjective  Beth Macdonald is a 75 y.o. female who presents for the following: Follow-up (Here to follow up on nose. Tx- 04/20/21 [facility] patient is doing well no concerns. ).   The following portions of the chart were reviewed this encounter and updated as appropriate:  Tobacco   Allergies   Meds   Problems   Med Hx   Surg Hx   Fam Hx       Objective  Well appearing patient in no apparent distress; mood and affect are within normal limits.  A focused examination was performed including face. Relevant physical exam findings are noted in the Assessment and Plan.  Mid Supratip of Nose  Mid Supratip of Nose Pink, smooth with a few ectasias.    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  Return to clinic if recurs.  I, Tawanna Funk, PA-C, have reviewed all documentation's for this visit.  The documentation on 08/16/21 for the exam, diagnosis, procedures and orders are all accurate and complete.

## 2021-08-24 DIAGNOSIS — M1711 Unilateral primary osteoarthritis, right knee: Secondary | ICD-10-CM | POA: Diagnosis not present

## 2021-09-18 DIAGNOSIS — I10 Essential (primary) hypertension: Secondary | ICD-10-CM | POA: Diagnosis not present

## 2021-09-18 DIAGNOSIS — I700 Atherosclerosis of aorta: Secondary | ICD-10-CM | POA: Diagnosis not present

## 2021-09-18 DIAGNOSIS — E782 Mixed hyperlipidemia: Secondary | ICD-10-CM | POA: Diagnosis not present

## 2021-09-18 DIAGNOSIS — Z8673 Personal history of transient ischemic attack (TIA), and cerebral infarction without residual deficits: Secondary | ICD-10-CM | POA: Diagnosis not present

## 2021-09-18 DIAGNOSIS — E1129 Type 2 diabetes mellitus with other diabetic kidney complication: Secondary | ICD-10-CM | POA: Diagnosis not present

## 2021-09-21 DIAGNOSIS — E782 Mixed hyperlipidemia: Secondary | ICD-10-CM | POA: Diagnosis not present

## 2021-09-21 DIAGNOSIS — E1129 Type 2 diabetes mellitus with other diabetic kidney complication: Secondary | ICD-10-CM | POA: Diagnosis not present

## 2021-09-21 DIAGNOSIS — Z8673 Personal history of transient ischemic attack (TIA), and cerebral infarction without residual deficits: Secondary | ICD-10-CM | POA: Diagnosis not present

## 2021-09-21 DIAGNOSIS — I10 Essential (primary) hypertension: Secondary | ICD-10-CM | POA: Diagnosis not present

## 2021-09-21 DIAGNOSIS — Z23 Encounter for immunization: Secondary | ICD-10-CM | POA: Diagnosis not present

## 2021-10-05 DIAGNOSIS — E119 Type 2 diabetes mellitus without complications: Secondary | ICD-10-CM | POA: Diagnosis not present

## 2021-10-05 DIAGNOSIS — H524 Presbyopia: Secondary | ICD-10-CM | POA: Diagnosis not present

## 2021-10-05 DIAGNOSIS — H04123 Dry eye syndrome of bilateral lacrimal glands: Secondary | ICD-10-CM | POA: Diagnosis not present

## 2021-10-12 DIAGNOSIS — E782 Mixed hyperlipidemia: Secondary | ICD-10-CM | POA: Diagnosis not present

## 2021-10-12 DIAGNOSIS — Z01818 Encounter for other preprocedural examination: Secondary | ICD-10-CM | POA: Diagnosis not present

## 2021-10-12 DIAGNOSIS — Z791 Long term (current) use of non-steroidal anti-inflammatories (NSAID): Secondary | ICD-10-CM | POA: Diagnosis not present

## 2021-10-12 DIAGNOSIS — Z8673 Personal history of transient ischemic attack (TIA), and cerebral infarction without residual deficits: Secondary | ICD-10-CM | POA: Diagnosis not present

## 2021-10-12 DIAGNOSIS — E1129 Type 2 diabetes mellitus with other diabetic kidney complication: Secondary | ICD-10-CM | POA: Diagnosis not present

## 2021-10-12 DIAGNOSIS — I10 Essential (primary) hypertension: Secondary | ICD-10-CM | POA: Diagnosis not present

## 2021-10-13 DIAGNOSIS — E1129 Type 2 diabetes mellitus with other diabetic kidney complication: Secondary | ICD-10-CM | POA: Diagnosis not present

## 2021-11-23 ENCOUNTER — Ambulatory Visit (INDEPENDENT_AMBULATORY_CARE_PROVIDER_SITE_OTHER): Payer: MEDICARE | Admitting: Sports Medicine

## 2021-11-23 VITALS — BP 157/66 | Ht 68.0 in | Wt 175.0 lb

## 2021-11-23 DIAGNOSIS — M25551 Pain in right hip: Secondary | ICD-10-CM | POA: Diagnosis not present

## 2021-11-23 DIAGNOSIS — M25552 Pain in left hip: Secondary | ICD-10-CM | POA: Diagnosis not present

## 2021-11-23 MED ORDER — NITROGLYCERIN 0.2 MG/HR TD PT24: MEDICATED_PATCH | TRANSDERMAL | 1 refills | Status: DC

## 2021-11-23 NOTE — Assessment & Plan Note (Signed)
Right greater than left hip abductor strain.  Findings consistent with this on ultrasound as well, only scanned right side given more significant symptoms on the right.  Recommend avoiding kicking maneuvers in the pool or any other maneuvers that cause pain, but continue with range of motion.  We will give her isometric hip abduction exercises, advised to use compression while in the pool.  We will also give Nitropatch per protocol, no history of headaches.  She can follow-up in 6 weeks. ?

## 2021-11-23 NOTE — Patient Instructions (Signed)
Thank you for coming to see me today. It was a pleasure. Today we talked about:  ? ?You have a strain of your hip adductors. We will use nitroglycerin to increase the healing, use the protocol below.   ? ?We will also give you exercises to do 4-5 times a week.  Avoid any exercises in the pool that cause pain.   ? ?You can use a compression sleeve in the pool as well to keep the muscles warm. ? ?Nitroglycerin Protocol ? ?Apply 1/4 nitroglycerin patch to affected area daily. ?Change position of patch within the affected area every 24 hours. ?You may experience a headache during the first 1-2 weeks of using the patch, these should subside. ?If you experience headaches after beginning nitroglycerin patch treatment, you may take your preferred over the counter pain reliever. ?Another side effect of the nitroglycerin patch is skin irritation or rash related to patch adhesive. ?Please notify our office if you develop more severe headaches or rash, and stop the patch. ?Tendon healing with nitroglycerin patch may require 12 to 24 weeks depending on the extent of injury. ?Men should not use if taking Viagra, Cialis, or Levitra.  ?Do not use if you have migraines or rosacea.  ? ? ?Please follow-up with [place] in 6 weeks. ? ?If you have any questions or concerns, please do not hesitate to call the office at [phone]. ? ?Best,  ? ?Arizona Constable, DO ?[place]  ?

## 2021-11-23 NOTE — Progress Notes (Signed)
? ?  Beth Macdonald is a 76 y.o. female who presents to [facility] today for the following: ? ?B/L hip/groin pain ?Patient has known osteoarthritis of the right knee that had previously been getting worse ?Is planning for a joint replacement of the right knee on 01/01/2022 ?A few weeks ago started to notice right anterior groin pain ?Teaches water aerobics with kicks ?Then started to have pain on the left side as well ?States that the pain mostly in the region of her groin ?Hurts worse when she is flexing her hip while in the pool ?Because of that she stopped wearing water shoes to decrease the weight, but states that she thinks the pain was too severe before she did this and it is not helping ?She has a difficult time putting on pants as well as getting in and out of the car ?No specific injury ? ? ?PMH reviewed.  ?ROS as above. ?Medications reviewed. ? ?Exam:  ?BP (!) 157/66   Ht '5\' 8"'$  (1.727 m)   Wt 175 lb (79.4 kg)   BMI 26.61 kg/m?  ?Gen: Well NAD ?MSK: ? ?B/L Hips:  ?- Inspection: No gross deformity, no swelling, erythema, or ecchymosis b/l ?- Palpation: She has tenderness to palpation at the adductor origin at the pubis bilaterally, but right is slightly more tender than left.  No significant tenderness palpation of the hip flexors or ASIS ?- ROM: She has slightly decreased internal rotation bilaterally but no significant pain with this, full external rotation bilaterally. ?- Strength: Normal strength in all fields b/l, does have pain with resisted adductor testing ?- Neuro/vasc: NV intact distally b/l ?- Special Tests: Negative FADIR b/l, but feels pulling in groin b/l with FABER.  Slightly positive Stinchfield bilaterally with anterior groin pain ? ?Limited ultrasound of right anterior groin shows hypoechoic collection at the origin of the adductors at the pubis.  There is slight hyperechoic appearance at this proximal tendon as well.  Negative overlying Doppler flow.  Remainder of the adductor musculature is  within normal limits.  There is tenderness on sono palpation at the region of the hypoechoic collection. ?Impression: Right hip abductor strain with likely small tearing at proximal origin  ? ?Ultrasound and interpretation by Wolfgang Phoenix. Oneida Alar, MD and [place] Constable, DO ? ? ?No results found. ? ? ?Assessment and Plan: ?1) Bilateral hip pain ?Right greater than left hip abductor strain.  Findings consistent with this on ultrasound as well, only scanned right side given more significant symptoms on the right.  Recommend avoiding kicking maneuvers in the pool or any other maneuvers that cause pain, but continue with range of motion.  We will give her isometric hip abduction exercises, advised to use compression while in the pool.  We will also give Nitropatch per protocol, no history of headaches.  She can follow-up in 6 weeks. ? ? ?Arizona Constable, D.O.  ?PGY-4 [facility]  ?11/23/2021 3:05 PM ? ?I observed and examined the patient with the resident and agree with assessment and plan.  Note reviewed and modified by me. ?KB Fields,MD ?

## 2021-11-27 DIAGNOSIS — M25661 Stiffness of right knee, not elsewhere classified: Secondary | ICD-10-CM | POA: Diagnosis not present

## 2021-11-27 DIAGNOSIS — M1711 Unilateral primary osteoarthritis, right knee: Secondary | ICD-10-CM | POA: Diagnosis not present

## 2021-11-27 DIAGNOSIS — M25561 Pain in right knee: Secondary | ICD-10-CM | POA: Diagnosis not present

## 2021-12-08 NOTE — H&P (Signed)
TOTAL KNEE ADMISSION H&P ? ?Patient is being admitted for right total knee arthroplasty. ? ?Subjective: ? ?Chief Complaint: Right knee pain. ? ?HPI: Beth Macdonald, 76 y.o. female has a history of pain and functional disability in the right knee due to arthritis and has failed non-surgical conservative treatments for greater than 12 weeks to include NSAID's and/or analgesics, flexibility and strengthening excercises, and activity modification. Onset of symptoms was gradual, starting  several  years ago with gradually worsening course since that time. The patient noted no past surgery on the right knee.  Patient currently rates pain in the right knee at 7 out of 10 with activity. Patient has worsening of pain with activity and weight bearing, pain that interferes with activities of daily living, pain with passive range of motion, and crepitus. Patient has evidence of periarticular osteophytes and joint space narrowing by imaging studies. There is no active infection. ? ?Patient Active Problem List  ? Diagnosis Date Noted  ? Bilateral hip pain 11/23/2021  ? Murmur 03/16/2020  ? H/O TIA (transient ischemic attack) and stroke 02/02/2019  ? Mixed hyperlipidemia 02/02/2019  ? TIA (transient ischemic attack) 10/29/2018  ? Dyslipidemia 10/29/2018  ? Type 2 diabetes mellitus with hyperlipidemia ([place]) 10/29/2018  ? Tendinopathy of hip 07/12/2016  ? Osteoarthritis of right knee 05/29/2016  ? Sleep disturbance 05/29/2016  ? Bilateral acromioclavicular joint arthritis 11/08/2015  ? Dizzy 07/21/2015  ? Rotator cuff syndrome of both shoulders 06/21/2015  ? Pain in joint, ankle and foot 10/20/2012  ? Bilateral leg weakness 10/20/2012  ? ? ?Past Medical History:  ?Diagnosis Date  ? Aortic atherosclerosis ([place])   ? Chronic neck pain   ? Diabetes mellitus without complication ([place])   ? Type 2  ? Diabetic neuropathy ([place])   ? DVT of proximal lower limb ([place])   ? Facial paralysis/Bells palsy   ? 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 Hyperlipidemia   ? Mitral valve insufficiency   ? Neck pain   ? Osteoarthritis   ? SCCA (squamous cell carcinoma) of skin 03/22/2021  ? Bridge of nose (in situ)  ? Skull fracture ([place])   ? Squamous cell carcinoma of skin 03/22/2021  ? Right forehead (in situ)  ? TIA (transient ischemic attack)   ? Varicose vein of leg   ? ? ?Past Surgical History:  ?Procedure Laterality Date  ? ELBOW SURGERY Bilateral   ? 20 yrs ago  ? ENDOVENOUS ABLATION SAPHENOUS VEIN W/ LASER Bilateral   ? ENDOVENOUS ABLATION SAPHENOUS VEIN W/ LASER Left 11/19/2019  ? endovenous laser ablation anterior accessory branch of left GSV and stab phlebectomy> 20 incisions left leg by Gae Gallop MD   ? shoulder Bilateral   ? TOENAIL EXCISION Right 2019  ? Big toe  ? TONSILLECTOMY    ? ? ?Prior to Admission medications   ?Medication Sig Start Date End Date Taking? Authorizing Provider  ?alclomethasone (ACLOVATE) 0.05 % cream Apply topically 2 (two) times daily. 12/25/19   Warren Danes, PA-C  ?Calcium Carbonate Antacid (TUMS PO) Take by mouth as needed.    [provider]  ?cholecalciferol (VITAMIN D3) 25 MCG (1000 UT) tablet Take 1,000 Units by mouth daily.    [provider]  ?clobetasol cream (TEMOVATE) 3.79 % Apply 1 application topically 2 (two) times daily. 03/22/21   Warren Danes, PA-C  ?Clobetasol Prop Emollient Base (CLOBETASOL PROPIONATE E) 0.05 % emollient cream Apply 1 application topically 2 (two) times daily. Apply as directed  03/29/20   Warren Danes, PA-C  ?COVID-19 mRNA bivalent vaccine, Pfizer, (PFIZER COVID-19 VAC BIVALENT) injection Inject into the muscle. 07/18/21   Carlyle Basques, MD  ?cycloSPORINE (RESTASIS) 0.05 % ophthalmic emulsion 1 drop 2 (two) times daily.    [provider]  ?desonide (DESOWEN) 0.05 % cream Apply topically 2 (two) times daily as needed (Rash). 03/22/21   Warren Danes, PA-C  ?Evolocumab with Infusor (Rudy) 420 MG/3.5ML SOCT Inject  into the skin every 14 (fourteen) days.    [provider]  ?ibuprofen (ADVIL) 800 MG tablet as needed. 11/25/19   [provider]  ?lisinopril (ZESTRIL) 2.5 MG tablet Take 2.5 mg by mouth daily. 12/30/20   [provider]  ?MAGNESIUM PO Take 400 mcg by mouth daily.     [provider]  ?metFORMIN (GLUCOPHAGE) 500 MG tablet INCREASE TO 2 TABLETS TWICE A DAY AS DISCUSSED ORALLY 90 DAYS 12/14/18   [provider]  ?metFORMIN (GLUCOPHAGE-XR) 500 MG 24 hr tablet Take 1,000 mg by mouth 2 (two) times daily. 02/22/21   [provider]  ?nitroGLYCERIN (NITRODUR - DOSED IN MG/24 HR) 0.2 mg/hr patch Use 1/4 patch daily to the affected area. 11/23/21   Stefanie Libel, MD  ?OMEGA-3 FATTY ACIDS PO Take 1,000 mcg by mouth daily.     [provider]  ?Semaglutide (RYBELSUS) 7 MG TABS  06/07/21   [provider]  ? ? ?Allergies  ?Allergen Reactions  ? Clindamycin/Lincomycin   ?  Cannot tolerate mycins causes C-Diff  ? Tetracyclines & Related Other (See Comments)  ?  "Feel bad"  ? ? ?Social History  ? ?Socioeconomic History  ? Marital status: Married  ?  Spouse name: Not on file  ? Number of children: 0  ? Years of education: Not on file  ? Highest education level: Not on file  ?Occupational History  ? Not on file  ?Tobacco Use  ? Smoking status: Former  ?  Packs/day: 1.00  ?  Years: 20.00  ?  Pack years: 20.00  ?  Types: Cigarettes  ?  Quit date: 15  ?  Years since quitting: 43.2  ? Smokeless tobacco: Never  ?Vaping Use  ? Vaping Use: Never used  ?Substance and Sexual Activity  ? Alcohol use: Yes  ?  Comment: social/rarely  ? Drug use: Never  ? Sexual activity: Yes  ?Other Topics Concern  ? Not on file  ?Social History Narrative  ? Not on file  ? ?Social Determinants of Health  ? ?Financial Resource Strain: Not on file  ?Food Insecurity: Not on file  ?Transportation Needs: Not on file  ?Physical Activity: Not on file  ?Stress: Not on file  ?Social Connections: Not on  file  ?Intimate Partner Violence: Not on file  ? ? ?Tobacco Use: Medium Risk  ? Smoking Tobacco Use: Former  ? Smokeless Tobacco Use: Never  ? Passive Exposure: Not on file  ? ?Social History  ? ?Substance and Sexual Activity  ?Alcohol Use Yes  ? Comment: social/rarely  ? ? ?Family History  ?Problem Relation Age of Onset  ? Stroke Mother   ? Hypertension Father   ? Dementia Father   ? Leukemia Brother   ? Stroke Brother   ? Colon cancer Neg Hx   ? Esophageal cancer Neg Hx   ? Pancreatic cancer Neg Hx   ? Stomach cancer Neg Hx   ? ? ?ROS: ?Constitutional: no fever, no chills, no night sweats, no  significant weight loss ?Cardiovascular: no chest pain, no palpitations ?Respiratory: no cough, no shortness of breath, No COPD ?Gastrointestinal: no vomiting, no nausea ?Musculoskeletal: no swelling in Joints, Joint Pain ?Neurologic: no numbness, no tingling, no difficulty with balance ? ? ?Objective: ? ?Physical Exam: ?Well nourished and well developed.  ?General: Alert and oriented x3, cooperative and pleasant, no acute distress.  ?Head: normocephalic, atraumatic, neck supple.  ?Eyes: EOMI.  ?Respiratory: breath sounds clear in all fields, no wheezing, rales, or rhonchi. ?Cardiovascular: Regular rate and rhythm, no murmurs, gallops or rubs.  ?Abdomen: non-tender to palpation and soft, normoactive bowel sounds. ?Musculoskeletal: ? ? Right Hip Exam:  ? The range of motion: normal without discomfort.  ?  ? Right Knee Exam:  ? Slight valgus deformity.  ? No effusion present.  ? The range of motion is: 5 to 115 degrees.  ? Moderate crepitus on range of motion of the knee.  ? No medial joint line tenderness.  ? Some lateral joint line tenderness.  ? The knee is stable.  ?  ? Left Knee Exam:  ? No effusion present. No swelling present.  ? The Range of motion is: 0 to 125 degrees.  ? No crepitus on range of motion of the knee.  ? No medial joint line Tenderness.  ? No lateral joint line tenderness.  ? The knee is  stable. ? ? ?Calves soft and nontender. Motor function intact in LE. Strength 5/5 LE bilaterally. ?Neuro: Distal pulses 2+. Sensation to light touch intact in LE. ? ? ? ?Vital signs in last 24 hours: ?BP: ()/()  ?Arter

## 2021-12-13 DIAGNOSIS — I10 Essential (primary) hypertension: Secondary | ICD-10-CM | POA: Diagnosis not present

## 2021-12-13 DIAGNOSIS — R197 Diarrhea, unspecified: Secondary | ICD-10-CM | POA: Diagnosis not present

## 2021-12-13 DIAGNOSIS — E1165 Type 2 diabetes mellitus with hyperglycemia: Secondary | ICD-10-CM | POA: Diagnosis not present

## 2021-12-13 DIAGNOSIS — E782 Mixed hyperlipidemia: Secondary | ICD-10-CM | POA: Diagnosis not present

## 2021-12-13 DIAGNOSIS — E118 Type 2 diabetes mellitus with unspecified complications: Secondary | ICD-10-CM | POA: Diagnosis not present

## 2021-12-18 DIAGNOSIS — R197 Diarrhea, unspecified: Secondary | ICD-10-CM | POA: Diagnosis not present

## 2021-12-18 DIAGNOSIS — E118 Type 2 diabetes mellitus with unspecified complications: Secondary | ICD-10-CM | POA: Diagnosis not present

## 2021-12-18 DIAGNOSIS — I773 Arterial fibromuscular dysplasia: Secondary | ICD-10-CM | POA: Diagnosis not present

## 2021-12-18 DIAGNOSIS — Z8673 Personal history of transient ischemic attack (TIA), and cerebral infarction without residual deficits: Secondary | ICD-10-CM | POA: Diagnosis not present

## 2021-12-18 DIAGNOSIS — I10 Essential (primary) hypertension: Secondary | ICD-10-CM | POA: Diagnosis not present

## 2021-12-18 DIAGNOSIS — M810 Age-related osteoporosis without current pathological fracture: Secondary | ICD-10-CM | POA: Diagnosis not present

## 2021-12-18 DIAGNOSIS — I700 Atherosclerosis of aorta: Secondary | ICD-10-CM | POA: Diagnosis not present

## 2021-12-18 DIAGNOSIS — Z0000 Encounter for general adult medical examination without abnormal findings: Secondary | ICD-10-CM | POA: Diagnosis not present

## 2021-12-18 NOTE — Patient Instructions (Signed)
2 VISITORS ARE ALLOWED TO COME WITH YOU AND STAY IN THE WAITING ROOM ONLY DURING PRE OP AND PROCEDURE.  ?  ?**NO VISITORS ARE ALLOWED IN THE SHORT STAY AREA OR RECOVERY ROOM!!** ? ?IF YOU WILL BE ADMITTED INTO THE HOSPITAL YOU ARE ALLOWED ONLY 4 SUPPORT PEOPLE DURING VISITATION HOURS ONLY (7 AM -8PM)   ?The support person(s) must pass our screening, gel in and out, and wear a mask at all times, including in the patient?s room. ?Patients must also wear a mask when staff or their support person are in the room. ?Visitors GUEST BADGE MUST BE WORN VISIBLY  ?One adult visitor may remain with you overnight and MUST be in the room by 8 P.M. ?  ? ? Your procedure is scheduled on: 01/01/22 ? ? Report to [facility] Main Entrance ? ?  Report to admitting at  9:55 AM ? ? Call this number if you have problems the morning of surgery [phone] ? ? Do not eat food :After Midnight. ? ? After Midnight you may have the following liquids until _9:30__ AM DAY OF SURGERY ? ?Water ?Black Coffee (sugar ok, NO MILK/CREAM OR CREAMERS)  ?Tea (sugar ok, NO MILK/CREAM OR CREAMERS) regular and decaf                             ?Plain Jell-O (NO RED)                                           ?Fruit ices (not with fruit pulp, NO RED)                                     ?Popsicles (NO RED)                                                                  ?Juice: apple, WHITE grape, WHITE cranberry ?Sports drinks like Gatorade (NO RED) ?Clear broth(vegetable,chicken,beef) ? ?             ? ?  ?  ?The day of surgery:  ?Drink ONE (1)  G2 at 9:15 AM the morning of surgery. Drink in one sitting. Do not sip.  ?This drink was given to you during your hospital  ?pre-op appointment visit. ?Nothing else to drink after completing the  ? G2. At 9:30 AM ?  ?       If you have questions, please contact your surgeon?s office. ? ? ?  ?  ?Oral Hygiene is also important to reduce your risk of infection.                                    ?Remember - BRUSH  YOUR TEETH THE MORNING OF SURGERY WITH YOUR REGULAR TOOTHPASTE ? ? Do NOT smoke after Midnight ? ? Take these medicines the morning of surgery with A SIP OF WATER: none ? ?How to Manage Your Diabetes ?Before and After Surgery ? ?Why  is it important to control my blood sugar before and after surgery? ?Improving blood sugar levels before and after surgery helps healing and can limit problems. ?A way of improving blood sugar control is eating a healthy diet by: ? Eating less sugar and carbohydrates ? Increasing activity/exercise ? Talking with your doctor about reaching your blood sugar goals ?High blood sugars (greater than 180 mg/dL) can raise your risk of infections and slow your recovery, so you will need to focus on controlling your diabetes during the weeks before surgery. ?Make sure that the doctor who takes care of your diabetes knows about your planned surgery including the date and location. ? ?How do I manage my blood sugar before surgery? ?Check your blood sugar at least 4 times a day, starting 2 days before surgery, to make sure that the level is not too high or low. ?Check your blood sugar the morning of your surgery when you wake up and every 2 hours until you get to the Short Stay unit. ?If your blood sugar is less than 70 mg/dL, you will need to treat for low blood sugar: ?Do not take insulin. ?Treat a low blood sugar (less than 70 mg/dL) with ? cup of clear juice (cranberry or apple), 4 glucose tablets, OR glucose gel. ?Recheck blood sugar in 15 minutes after treatment (to make sure it is greater than 70 mg/dL). If your blood sugar is not greater than 70 mg/dL on recheck, call [phone] for further instructions. ?Report your blood sugar to the short stay nurse when you get to Short Stay. ? ?If you are admitted to the hospital after surgery: ?Your blood sugar will be checked by the staff and you will probably be given insulin after surgery (instead of oral diabetes medicines) to make sure you have  good blood sugar levels. ?The goal for blood sugar control after surgery is 80-180 mg/dL. ? ? ?WHAT DO I DO ABOUT MY DIABETES MEDICATION? ? ?Do not take oral diabetes medicines (pills) the morning of surgery. ? ? ? ?Bring CPAP mask and tubing day of surgery. ?                  ?           You may not have any metal on your body including hair pins, jewelry, and body piercing ? ?           Do not wear make-up, lotions, powders, perfumes/cologne, or deodorant ? ?Do not wear nail polish including gel and S&S, artificial/acrylic nails, or any other type of covering on natural nails including finger and toenails. If you have artificial nails, gel coating, etc. that needs to be removed by a nail salon please have this removed prior to surgery or surgery may need to be canceled/ delayed if the surgeon/ anesthesia feels like they are unable to be safely monitored.  ? ?Do not shave  48 hours prior to surgery.  ? ?        ? ? Do not bring valuables to the hospital. [facility] NOT ?            RESPONSIBLE   FOR VALUABLES. ? ? Contacts, dentures or bridgework may not be worn into surgery. ? ? Bring small overnight bag day of surgery. ?  ? ? Special Instructions: Bring a copy of your healthcare power of attorney and living will documents the day of surgery if you haven't scanned them before. ? ?  Please read over the following fact sheets you were given: IF [facility] [phone] ? ?   [facility] - Preparing for Surgery ?Before surgery, you can play an important role.  Because skin is not sterile, your skin needs to be as free of germs as possible.  You can reduce the number of germs on your skin by washing with CHG (chlorahexidine gluconate) soap before surgery.  CHG is an antiseptic cleaner which kills germs and bonds with the skin to continue killing germs even after washing. ?Please DO NOT use if you have an allergy to CHG or antibacterial soaps.  If your  skin becomes reddened/irritated stop using the CHG and inform your nurse when you arrive at Short Stay. ?Do not shave (including legs and underarms) for at least 48 hours prior to the first CHG shower.   ?Please follow these instructions carefully: ? 1.  Shower with CHG Soap the night before surgery and the  morning of Surgery. ? 2.  If you choose to wash your hair, wash your hair first as usual with your  normal  shampoo. ? 3.  After you shampoo, rinse your hair and body thoroughly to remove the  shampoo.                   ?         4.  Use CHG as you would any other liquid soap.  You can apply chg directly  to the skin and wash  ?                     Gently with a scrungie or clean washcloth. ? 5.  Apply the CHG Soap to your body ONLY FROM THE NECK DOWN.   Do not use on face/ open      ?                     Wound or open sores. Avoid contact with eyes, ears mouth and genitals (private parts).  ?                     Production manager,  Genitals (private parts) with your normal soap. ?            6.  Wash thoroughly, paying special attention to the area where your surgery  will be performed. ? 7.  Thoroughly rinse your body with warm water from the neck down. ? 8.  DO NOT shower/wash with your normal soap after using and rinsing off  the CHG Soap. ?            9.  Pat yourself dry with a clean towel. ?           10.  Wear clean pajamas. ?           11.  Place clean sheets on your bed the night of your first shower and do not  sleep with pets. ?Day of Surgery : ?Do not apply any lotions/deodorants the morning of surgery.  Please wear clean clothes to the hospital/surgery center. ? ?FAILURE TO FOLLOW THESE INSTRUCTIONS MAY RESULT IN THE CANCELLATION OF YOUR SURGERY ? ? ? ?________________________________________________________________________  ? ?Incentive Spirometer ? ?An incentive spirometer is a tool that can help keep your lungs clear and active. This tool measures how well you are filling your lungs with each breath.  Taking long deep breaths may help reverse or  decrease the chance of developing breathing (pulmonary) problems (especially infection) following: ?A long period of time when you are unable to move or be activ

## 2021-12-19 ENCOUNTER — Encounter (HOSPITAL_COMMUNITY): Payer: Self-pay

## 2021-12-19 ENCOUNTER — Other Ambulatory Visit: Payer: Self-pay

## 2021-12-19 ENCOUNTER — Encounter (HOSPITAL_COMMUNITY)
Admission: RE | Admit: 2021-12-19 | Discharge: 2021-12-19 | Disposition: A | Discharge status: Home / Self Care | Payer: MEDICARE | Source: Ambulatory Visit | Attending: Orthopedic Surgery | Admitting: Orthopedic Surgery

## 2021-12-19 VITALS — BP 157/68 | HR 77 | Temp 98.3°F | Resp 18 | Ht 68.0 in | Wt 174.0 lb

## 2021-12-19 DIAGNOSIS — Z01812 Encounter for preprocedural laboratory examination: Secondary | ICD-10-CM | POA: Insufficient documentation

## 2021-12-19 DIAGNOSIS — E785 Hyperlipidemia, unspecified: Secondary | ICD-10-CM | POA: Insufficient documentation

## 2021-12-19 DIAGNOSIS — E1169 Type 2 diabetes mellitus with other specified complication: Secondary | ICD-10-CM | POA: Insufficient documentation

## 2021-12-19 DIAGNOSIS — M1711 Unilateral primary osteoarthritis, right knee: Secondary | ICD-10-CM | POA: Insufficient documentation

## 2021-12-19 DIAGNOSIS — Z01818 Encounter for other preprocedural examination: Secondary | ICD-10-CM

## 2021-12-19 LAB — SURGICAL PCR SCREEN
MRSA, PCR: NEGATIVE
Staphylococcus aureus: NEGATIVE

## 2021-12-19 LAB — GLUCOSE, CAPILLARY: Glucose-Capillary: 149 mg/dL — ABNORMAL HIGH (ref 70–99)

## 2021-12-19 NOTE — Progress Notes (Addendum)
Anesthesia note: ? ?Bowel prep reminder:na ? ?PCP - Dr.W. Shelia Media ?Cardiologist -no ?Other-  ? ?Chest x-ray - no ?EKG - 10/12/21 on chart ?Stress Test - no ?ECHO - 03/22/20-epic ?Cardiac Cath - no ? ?Pacemaker/ICD device last checked:NA ? ?Sleep Study - no ?CPAP -  ? ?Pt is pre diabetic-doesn't test ?Fasting Blood Sugar -  ?Checks Blood Sugar _____ ? ?Blood Thinner:NA ?Blood Thinner Instructions: ?Aspirin Instructions: ?Last Dose: ? ?Anesthesia review: no ? ?Patient denies shortness of breath, fever, cough and chest pain at PAT appointment ?Pt has no SOB with any activities. ? ?Patient verbalized understanding of instructions that were given to them at the PAT appointment. Patient was also instructed that they will need to review over the PAT instructions again at home before surgery. Yes she was here with her wife ?

## 2021-12-26 ENCOUNTER — Ambulatory Visit (INDEPENDENT_AMBULATORY_CARE_PROVIDER_SITE_OTHER): Payer: MEDICARE | Admitting: Sports Medicine

## 2021-12-26 DIAGNOSIS — M67959 Unspecified disorder of synovium and tendon, unspecified thigh: Secondary | ICD-10-CM

## 2021-12-26 NOTE — Assessment & Plan Note (Signed)
While she has an abductor injury I believe this will likely improve with knee replacement ?We discussed some of the exercises they will do with the physical therapy following knee replacement ?I suspect these will benefit this ?Once she has completed recovery from that if she still having the issues we will reevaluate  ?She should get back into the water based exercise once she is cleared from her surgeon ?

## 2021-12-26 NOTE — Progress Notes (Signed)
Chief complaint right abductor pain ? 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 ?Physical exam ?Pleasant older female in no acute distress ?BP 138/80   Ht '5\' 8"'$  (1.727 m)   Wt 175 lb (79.4 kg)   BMI 26.61 kg/m?  ? ?Hip range of motion is normal on the right and left hip ?Right hip flexion is strong ?Right hip abduction is strong ?Right hip adduction shows some mild weakness and pain with more resistance ?FABER and FADIR unremarkable ?

## 2021-12-29 ENCOUNTER — Other Ambulatory Visit: Payer: Self-pay | Admitting: Internal Medicine

## 2021-12-29 DIAGNOSIS — I700 Atherosclerosis of aorta: Secondary | ICD-10-CM

## 2022-01-01 ENCOUNTER — Observation Stay (HOSPITAL_COMMUNITY)
Admission: RE | Admit: 2022-01-01 | Discharge: 2022-01-02 | Disposition: A | Discharge status: Home / Self Care | Payer: MEDICARE | Source: Ambulatory Visit | Attending: Orthopedic Surgery | Admitting: Orthopedic Surgery

## 2022-01-01 ENCOUNTER — Ambulatory Visit (HOSPITAL_COMMUNITY): Payer: MEDICARE | Admitting: Physician Assistant

## 2022-01-01 ENCOUNTER — Encounter (HOSPITAL_COMMUNITY)
Admission: RE | Disposition: A | Discharge status: Home / Self Care | Payer: Self-pay | Source: Ambulatory Visit | Attending: Orthopedic Surgery

## 2022-01-01 ENCOUNTER — Encounter (HOSPITAL_COMMUNITY): Payer: Self-pay | Admitting: Orthopedic Surgery

## 2022-01-01 ENCOUNTER — Ambulatory Visit (HOSPITAL_BASED_OUTPATIENT_CLINIC_OR_DEPARTMENT_OTHER): Payer: MEDICARE | Admitting: Anesthesiology

## 2022-01-01 ENCOUNTER — Other Ambulatory Visit: Payer: Self-pay

## 2022-01-01 DIAGNOSIS — M1711 Unilateral primary osteoarthritis, right knee: Principal | ICD-10-CM | POA: Diagnosis present

## 2022-01-01 DIAGNOSIS — Z8673 Personal history of transient ischemic attack (TIA), and cerebral infarction without residual deficits: Secondary | ICD-10-CM | POA: Diagnosis not present

## 2022-01-01 DIAGNOSIS — Z7984 Long term (current) use of oral hypoglycemic drugs: Secondary | ICD-10-CM | POA: Diagnosis not present

## 2022-01-01 DIAGNOSIS — I340 Nonrheumatic mitral (valve) insufficiency: Secondary | ICD-10-CM | POA: Diagnosis not present

## 2022-01-01 DIAGNOSIS — Z79899 Other long term (current) drug therapy: Secondary | ICD-10-CM | POA: Insufficient documentation

## 2022-01-01 DIAGNOSIS — Z85828 Personal history of other malignant neoplasm of skin: Secondary | ICD-10-CM | POA: Diagnosis not present

## 2022-01-01 DIAGNOSIS — Z87891 Personal history of nicotine dependence: Secondary | ICD-10-CM

## 2022-01-01 DIAGNOSIS — G8918 Other acute postprocedural pain: Secondary | ICD-10-CM | POA: Diagnosis not present

## 2022-01-01 DIAGNOSIS — E119 Type 2 diabetes mellitus without complications: Secondary | ICD-10-CM

## 2022-01-01 DIAGNOSIS — I10 Essential (primary) hypertension: Secondary | ICD-10-CM | POA: Diagnosis not present

## 2022-01-01 DIAGNOSIS — M179 Osteoarthritis of knee, unspecified: Secondary | ICD-10-CM | POA: Diagnosis present

## 2022-01-01 HISTORY — PX: TOTAL KNEE ARTHROPLASTY: SHX125

## 2022-01-01 LAB — GLUCOSE, CAPILLARY
Glucose-Capillary: 105 mg/dL — ABNORMAL HIGH (ref 70–99)
Glucose-Capillary: 113 mg/dL — ABNORMAL HIGH (ref 70–99)
Glucose-Capillary: 135 mg/dL — ABNORMAL HIGH (ref 70–99)
Glucose-Capillary: 157 mg/dL — ABNORMAL HIGH (ref 70–99)

## 2022-01-01 SURGERY — ARTHROPLASTY, KNEE, TOTAL
Anesthesia: Monitor Anesthesia Care | Site: Knee | Laterality: Right

## 2022-01-01 MED ORDER — EPHEDRINE SULFATE (PRESSORS) 50 MG/ML IJ SOLN
INTRAMUSCULAR | Status: DC | PRN
  Administered 2022-01-01: 10 mg via INTRAVENOUS
  Administered 2022-01-01: 5 mg via INTRAVENOUS

## 2022-01-01 MED ORDER — INSULIN ASPART 100 UNIT/ML IJ SOLN: 0.0000 [IU] | Freq: Every day | INTRAMUSCULAR | Status: DC

## 2022-01-01 MED ORDER — PROPOFOL 1000 MG/100ML IV EMUL
INTRAVENOUS | Status: AC
  Filled 2022-01-01: qty 100

## 2022-01-01 MED ORDER — CHLORHEXIDINE GLUCONATE 0.12 % MT SOLN
15.0000 mL | Freq: Once | OROMUCOSAL | Status: AC
  Administered 2022-01-01: 15 mL via OROMUCOSAL

## 2022-01-01 MED ORDER — ONDANSETRON HCL 4 MG/2ML IJ SOLN
INTRAMUSCULAR | Status: DC | PRN
  Administered 2022-01-01: 4 mg via INTRAVENOUS

## 2022-01-01 MED ORDER — DEXAMETHASONE SODIUM PHOSPHATE 10 MG/ML IJ SOLN: 8.0000 mg | Freq: Once | INTRAMUSCULAR | Status: DC

## 2022-01-01 MED ORDER — SODIUM CHLORIDE 0.9 % IV SOLN: INTRAVENOUS | Status: DC

## 2022-01-01 MED ORDER — FENTANYL CITRATE PF 50 MCG/ML IJ SOSY
50.0000 ug | PREFILLED_SYRINGE | INTRAMUSCULAR | Status: DC
  Administered 2022-01-01: 50 ug via INTRAVENOUS
  Filled 2022-01-01: qty 2

## 2022-01-01 MED ORDER — SODIUM CHLORIDE (PF) 0.9 % IJ SOLN
INTRAMUSCULAR | Status: AC
  Filled 2022-01-01: qty 10

## 2022-01-01 MED ORDER — LIDOCAINE HCL (PF) 2 % IJ SOLN
INTRAMUSCULAR | Status: AC
  Filled 2022-01-01: qty 5

## 2022-01-01 MED ORDER — MENTHOL 3 MG MT LOZG: 1.0000 | LOZENGE | OROMUCOSAL | Status: DC | PRN

## 2022-01-01 MED ORDER — FENTANYL CITRATE (PF) 100 MCG/2ML IJ SOLN
INTRAMUSCULAR | Status: DC | PRN
Start: 2022-01-01 — End: 2022-01-01
  Administered 2022-01-01: 50 ug via INTRAVENOUS

## 2022-01-01 MED ORDER — BUPIVACAINE IN DEXTROSE 0.75-8.25 % IT SOLN
INTRATHECAL | Status: DC | PRN
  Administered 2022-01-01: 1.8 mL via INTRATHECAL

## 2022-01-01 MED ORDER — ALBUTEROL SULFATE HFA 108 (90 BASE) MCG/ACT IN AERS
INHALATION_SPRAY | RESPIRATORY_TRACT | Status: AC
  Filled 2022-01-01: qty 6.7

## 2022-01-01 MED ORDER — LACTATED RINGERS IV SOLN: INTRAVENOUS | Status: DC

## 2022-01-01 MED ORDER — MIDAZOLAM HCL 2 MG/2ML IJ SOLN
INTRAMUSCULAR | Status: AC
Start: 2022-01-01 — End: ?
  Filled 2022-01-01: qty 2

## 2022-01-01 MED ORDER — BUPIVACAINE LIPOSOME 1.3 % IJ SUSP
INTRAMUSCULAR | Status: AC
  Filled 2022-01-01: qty 20

## 2022-01-01 MED ORDER — FENTANYL CITRATE (PF) 100 MCG/2ML IJ SOLN
INTRAMUSCULAR | Status: AC
  Filled 2022-01-01: qty 2

## 2022-01-01 MED ORDER — ACETAMINOPHEN 500 MG PO TABS
1000.0000 mg | ORAL_TABLET | Freq: Four times a day (QID) | ORAL | Status: DC
  Administered 2022-01-01 – 2022-01-02 (×3): 1000 mg via ORAL
  Filled 2022-01-01 (×4): qty 2

## 2022-01-01 MED ORDER — SODIUM CHLORIDE 0.9 % IR SOLN
Status: DC | PRN
  Administered 2022-01-01: 1000 mL

## 2022-01-01 MED ORDER — MORPHINE SULFATE (PF) 2 MG/ML IV SOLN
1.0000 mg | INTRAVENOUS | Status: DC | PRN
  Administered 2022-01-01 – 2022-01-02 (×2): 2 mg via INTRAVENOUS
  Filled 2022-01-01 (×3): qty 1

## 2022-01-01 MED ORDER — CEFAZOLIN SODIUM-DEXTROSE 2-4 GM/100ML-% IV SOLN
2.0000 g | Freq: Four times a day (QID) | INTRAVENOUS | Status: AC
  Administered 2022-01-01 (×2): 2 g via INTRAVENOUS
  Filled 2022-01-01 (×2): qty 100

## 2022-01-01 MED ORDER — METOCLOPRAMIDE HCL 5 MG/ML IJ SOLN
5.0000 mg | Freq: Three times a day (TID) | INTRAMUSCULAR | Status: DC | PRN
  Administered 2022-01-02: 10 mg via INTRAVENOUS
  Filled 2022-01-01: qty 2

## 2022-01-01 MED ORDER — ROPIVACAINE HCL 7.5 MG/ML IJ SOLN
INTRAMUSCULAR | Status: DC | PRN
  Administered 2022-01-01: 20 mL via PERINEURAL

## 2022-01-01 MED ORDER — OXYCODONE HCL 5 MG PO TABS
5.0000 mg | ORAL_TABLET | ORAL | Status: DC | PRN
  Administered 2022-01-01 – 2022-01-02 (×4): 10 mg via ORAL
  Filled 2022-01-01 (×4): qty 2

## 2022-01-01 MED ORDER — ONDANSETRON HCL 4 MG PO TABS
4.0000 mg | ORAL_TABLET | Freq: Four times a day (QID) | ORAL | Status: DC | PRN
  Administered 2022-01-02: 4 mg via ORAL
  Filled 2022-01-01: qty 1

## 2022-01-01 MED ORDER — PROPOFOL 500 MG/50ML IV EMUL
INTRAVENOUS | Status: DC | PRN
Start: 2022-01-01 — End: 2022-01-01
  Administered 2022-01-01: 50 ug/kg/min via INTRAVENOUS

## 2022-01-01 MED ORDER — DEXAMETHASONE SODIUM PHOSPHATE 10 MG/ML IJ SOLN
INTRAMUSCULAR | Status: DC | PRN
  Administered 2022-01-01: 8 mg
  Administered 2022-01-01: 5 mg

## 2022-01-01 MED ORDER — BUPIVACAINE LIPOSOME 1.3 % IJ SUSP: 20.0000 mL | Freq: Once | INTRAMUSCULAR | Status: DC

## 2022-01-01 MED ORDER — DOCUSATE SODIUM 100 MG PO CAPS
100.0000 mg | ORAL_CAPSULE | Freq: Two times a day (BID) | ORAL | Status: DC
  Administered 2022-01-01 – 2022-01-02 (×2): 100 mg via ORAL
  Filled 2022-01-01 (×2): qty 1

## 2022-01-01 MED ORDER — AMISULPRIDE (ANTIEMETIC) 5 MG/2ML IV SOLN: 10.0000 mg | Freq: Once | INTRAVENOUS | Status: DC | PRN

## 2022-01-01 MED ORDER — FLUOROURACIL 4 % EX CREA: 1.0000 "application " | TOPICAL_CREAM | Freq: Every day | CUTANEOUS | Status: DC | PRN

## 2022-01-01 MED ORDER — BISACODYL 10 MG RE SUPP: 10.0000 mg | Freq: Every day | RECTAL | Status: DC | PRN

## 2022-01-01 MED ORDER — DIPHENHYDRAMINE HCL 12.5 MG/5ML PO ELIX: 12.5000 mg | ORAL_SOLUTION | ORAL | Status: DC | PRN

## 2022-01-01 MED ORDER — ONDANSETRON HCL 4 MG/2ML IJ SOLN
4.0000 mg | Freq: Four times a day (QID) | INTRAMUSCULAR | Status: DC | PRN
  Administered 2022-01-01 – 2022-01-02 (×2): 4 mg via INTRAVENOUS
  Filled 2022-01-01: qty 2

## 2022-01-01 MED ORDER — FLEET ENEMA 7-19 GM/118ML RE ENEM: 1.0000 | ENEMA | Freq: Once | RECTAL | Status: DC | PRN

## 2022-01-01 MED ORDER — RIVAROXABAN 10 MG PO TABS
10.0000 mg | ORAL_TABLET | Freq: Every day | ORAL | Status: DC
  Administered 2022-01-02: 10 mg via ORAL
  Filled 2022-01-01: qty 1

## 2022-01-01 MED ORDER — POLYETHYLENE GLYCOL 3350 17 G PO PACK: 17.0000 g | PACK | Freq: Every day | ORAL | Status: DC | PRN

## 2022-01-01 MED ORDER — BUPIVACAINE LIPOSOME 1.3 % IJ SUSP
INTRAMUSCULAR | Status: DC | PRN
  Administered 2022-01-01: 20 mL

## 2022-01-01 MED ORDER — LIDOCAINE HCL (CARDIAC) PF 100 MG/5ML IV SOSY
PREFILLED_SYRINGE | INTRAVENOUS | Status: DC | PRN
  Administered 2022-01-01: 40 mg via INTRAVENOUS

## 2022-01-01 MED ORDER — ONDANSETRON HCL 4 MG/2ML IJ SOLN
INTRAMUSCULAR | Status: AC
  Filled 2022-01-01: qty 2

## 2022-01-01 MED ORDER — TRANEXAMIC ACID-NACL 1000-0.7 MG/100ML-% IV SOLN
1000.0000 mg | INTRAVENOUS | Status: AC
  Administered 2022-01-01: 1000 mg via INTRAVENOUS
  Filled 2022-01-01: qty 100

## 2022-01-01 MED ORDER — ACETAMINOPHEN 10 MG/ML IV SOLN
1000.0000 mg | Freq: Four times a day (QID) | INTRAVENOUS | Status: DC
  Administered 2022-01-01: 1000 mg via INTRAVENOUS
  Filled 2022-01-01: qty 100

## 2022-01-01 MED ORDER — SODIUM CHLORIDE (PF) 0.9 % IJ SOLN
INTRAMUSCULAR | Status: DC | PRN
  Administered 2022-01-01: 60 mL

## 2022-01-01 MED ORDER — SODIUM CHLORIDE (PF) 0.9 % IJ SOLN
INTRAMUSCULAR | Status: AC
  Filled 2022-01-01: qty 50

## 2022-01-01 MED ORDER — FENTANYL CITRATE PF 50 MCG/ML IJ SOSY: 25.0000 ug | PREFILLED_SYRINGE | INTRAMUSCULAR | Status: DC | PRN

## 2022-01-01 MED ORDER — TRAMADOL HCL 50 MG PO TABS
50.0000 mg | ORAL_TABLET | Freq: Four times a day (QID) | ORAL | Status: DC | PRN
  Administered 2022-01-01: 50 mg via ORAL
  Administered 2022-01-02: 100 mg via ORAL
  Filled 2022-01-01 (×3): qty 2
  Filled 2022-01-01: qty 1

## 2022-01-01 MED ORDER — PHENYLEPHRINE HCL-NACL 20-0.9 MG/250ML-% IV SOLN
INTRAVENOUS | Status: AC
  Filled 2022-01-01: qty 250

## 2022-01-01 MED ORDER — ORAL CARE MOUTH RINSE: 15.0000 mL | Freq: Once | OROMUCOSAL | Status: AC

## 2022-01-01 MED ORDER — PHENOL 1.4 % MT LIQD: 1.0000 | OROMUCOSAL | Status: DC | PRN

## 2022-01-01 MED ORDER — DEXAMETHASONE SODIUM PHOSPHATE 10 MG/ML IJ SOLN
INTRAMUSCULAR | Status: AC
  Filled 2022-01-01: qty 1

## 2022-01-01 MED ORDER — METHOCARBAMOL 500 MG IVPB - SIMPLE MED
500.0000 mg | Freq: Four times a day (QID) | INTRAVENOUS | Status: DC | PRN
  Administered 2022-01-01: 500 mg via INTRAVENOUS
  Filled 2022-01-01: qty 500

## 2022-01-01 MED ORDER — EPHEDRINE 5 MG/ML INJ
INTRAVENOUS | Status: AC
  Filled 2022-01-01: qty 5

## 2022-01-01 MED ORDER — METOCLOPRAMIDE HCL 5 MG PO TABS: 5.0000 mg | ORAL_TABLET | Freq: Three times a day (TID) | ORAL | Status: DC | PRN

## 2022-01-01 MED ORDER — MIDAZOLAM HCL 2 MG/2ML IJ SOLN
1.0000 mg | INTRAMUSCULAR | Status: DC
  Administered 2022-01-01: 1 mg via INTRAVENOUS
  Filled 2022-01-01: qty 2

## 2022-01-01 MED ORDER — MIDAZOLAM HCL 5 MG/5ML IJ SOLN
INTRAMUSCULAR | Status: DC | PRN
  Administered 2022-01-01: 1 mg via INTRAVENOUS
  Administered 2022-01-01: .5 mg via INTRAVENOUS

## 2022-01-01 MED ORDER — CEFAZOLIN SODIUM-DEXTROSE 2-4 GM/100ML-% IV SOLN
2.0000 g | INTRAVENOUS | Status: AC
  Administered 2022-01-01: 2 g via INTRAVENOUS
  Filled 2022-01-01: qty 100

## 2022-01-01 MED ORDER — PROPOFOL 10 MG/ML IV BOLUS
INTRAVENOUS | Status: DC | PRN
  Administered 2022-01-01 (×2): 20 mg via INTRAVENOUS

## 2022-01-01 MED ORDER — METHOCARBAMOL 500 MG PO TABS
500.0000 mg | ORAL_TABLET | Freq: Four times a day (QID) | ORAL | Status: DC | PRN
  Administered 2022-01-02 (×2): 500 mg via ORAL
  Filled 2022-01-01 (×3): qty 1

## 2022-01-01 MED ORDER — SEMAGLUTIDE 7 MG PO TABS: 7.0000 mg | ORAL_TABLET | Freq: Every day | ORAL | Status: DC

## 2022-01-01 MED ORDER — INSULIN ASPART 100 UNIT/ML IJ SOLN
0.0000 [IU] | Freq: Three times a day (TID) | INTRAMUSCULAR | Status: DC
  Administered 2022-01-02 (×2): 2 [IU] via SUBCUTANEOUS

## 2022-01-01 MED ORDER — POVIDONE-IODINE 10 % EX SWAB
2.0000 "application " | Freq: Once | CUTANEOUS | Status: AC
  Administered 2022-01-01: 2 via TOPICAL

## 2022-01-01 SURGICAL SUPPLY — 59 items
ATTUNE MED DOME PAT 38 KNEE (Knees) ×1 IMPLANT
ATTUNE PSFEM RTSZ6 NARCEM KNEE (Femur) ×1 IMPLANT
ATTUNE PSRP INSR SZ6 6 KNEE (Insert) ×1 IMPLANT
BAG COUNTER SPONGE SURGICOUNT (BAG) ×1 IMPLANT
BAG SPEC THK2 15X12 ZIP CLS (MISCELLANEOUS) ×1
BAG SPNG CNTER NS LX DISP (BAG) ×1
BAG ZIPLOCK 12X15 (MISCELLANEOUS) ×3 IMPLANT
BASE TIBIA ATTUNE KNEE SYS SZ6 (Knees) IMPLANT
BLADE SAG 18X100X1.27 (BLADE) ×3 IMPLANT
BLADE SAW SGTL 11.0X1.19X90.0M (BLADE) ×3 IMPLANT
BNDG ELASTIC 6X5.8 VLCR STR LF (GAUZE/BANDAGES/DRESSINGS) ×3 IMPLANT
BOWL SMART MIX CTS (DISPOSABLE) ×3 IMPLANT
BSPLAT TIB 6 CMNT ROT PLAT STR (Knees) ×1 IMPLANT
CEMENT HV SMART SET (Cement) ×6 IMPLANT
COVER SURGICAL LIGHT HANDLE (MISCELLANEOUS) ×3 IMPLANT
CUFF TOURN SGL QUICK 34 (TOURNIQUET CUFF) ×2
CUFF TRNQT CYL 34X4.125X (TOURNIQUET CUFF) ×2 IMPLANT
DRAPE INCISE IOBAN 66X45 STRL (DRAPES) ×3 IMPLANT
DRAPE U-SHAPE 47X51 STRL (DRAPES) ×3 IMPLANT
DRSG AQUACEL AG ADV 3.5X10 (GAUZE/BANDAGES/DRESSINGS) ×3 IMPLANT
DURAPREP 26ML APPLICATOR (WOUND CARE) ×3 IMPLANT
ELECT REM PT RETURN 15FT ADLT (MISCELLANEOUS) ×3 IMPLANT
GLOVE BIO SURGEON STRL SZ 6.5 (GLOVE) IMPLANT
GLOVE BIO SURGEON STRL SZ7.5 (GLOVE) IMPLANT
GLOVE BIO SURGEON STRL SZ8 (GLOVE) ×3 IMPLANT
GLOVE BIOGEL PI IND STRL 6.5 (GLOVE) IMPLANT
GLOVE BIOGEL PI IND STRL 7.0 (GLOVE) IMPLANT
GLOVE BIOGEL PI IND STRL 8 (GLOVE) ×2 IMPLANT
GLOVE BIOGEL PI INDICATOR 6.5 (GLOVE)
GLOVE BIOGEL PI INDICATOR 7.0 (GLOVE)
GLOVE BIOGEL PI INDICATOR 8 (GLOVE) ×1
GOWN STRL REUS W/ TWL LRG LVL3 (GOWN DISPOSABLE) ×2 IMPLANT
GOWN STRL REUS W/ TWL XL LVL3 (GOWN DISPOSABLE) IMPLANT
GOWN STRL REUS W/TWL LRG LVL3 (GOWN DISPOSABLE) ×2
GOWN STRL REUS W/TWL XL LVL3 (GOWN DISPOSABLE)
HANDPIECE INTERPULSE COAX TIP (DISPOSABLE) ×2
HOLDER FOLEY CATH W/STRAP (MISCELLANEOUS) ×1 IMPLANT
IMMOBILIZER KNEE 20 (SOFTGOODS) ×2
IMMOBILIZER KNEE 20 THIGH 36 (SOFTGOODS) ×2 IMPLANT
KIT TURNOVER KIT A (KITS) IMPLANT
MANIFOLD NEPTUNE II (INSTRUMENTS) ×3 IMPLANT
NS IRRIG 1000ML POUR BTL (IV SOLUTION) ×3 IMPLANT
PACK TOTAL KNEE CUSTOM (KITS) ×3 IMPLANT
PADDING CAST COTTON 6X4 STRL (CAST SUPPLIES) ×5 IMPLANT
PROTECTOR NERVE ULNAR (MISCELLANEOUS) ×3 IMPLANT
SET HNDPC FAN SPRY TIP SCT (DISPOSABLE) ×2 IMPLANT
SPIKE FLUID TRANSFER (MISCELLANEOUS) ×4 IMPLANT
STRIP CLOSURE SKIN 1/2X4 (GAUZE/BANDAGES/DRESSINGS) ×6 IMPLANT
SUT MNCRL AB 4-0 PS2 18 (SUTURE) ×3 IMPLANT
SUT STRATAFIX 0 PDS 27 VIOLET (SUTURE) ×2
SUT VIC AB 2-0 CT1 27 (SUTURE) ×6
SUT VIC AB 2-0 CT1 TAPERPNT 27 (SUTURE) ×6 IMPLANT
SUTURE STRATFX 0 PDS 27 VIOLET (SUTURE) ×2 IMPLANT
TIBIA ATTUNE KNEE SYS BASE SZ6 (Knees) ×2 IMPLANT
TRAY FOLEY MTR SLVR 14FR STAT (SET/KITS/TRAYS/PACK) ×1 IMPLANT
TRAY FOLEY MTR SLVR 16FR STAT (SET/KITS/TRAYS/PACK) ×2 IMPLANT
TUBE SUCTION HIGH CAP CLEAR NV (SUCTIONS) ×3 IMPLANT
WATER STERILE IRR 1000ML POUR (IV SOLUTION) ×6 IMPLANT
WRAP KNEE MAXI GEL POST OP (GAUZE/BANDAGES/DRESSINGS) ×3 IMPLANT

## 2022-01-01 NOTE — Evaluation (Signed)
Physical Therapy Evaluation Patient Details Name: Beth Macdonald MRN: [id number] DOB: 11-25-1945 Today's Date: 01/01/2022  History of Present Illness  Pt is a 75yo female presenting s/p R-TKA on 01/01/22. PMH: DM with neuropathy, hx of DVT, bell's palsy, HLD, OA, Hx of TIA in 2020, bilateral shoulder surgery 2019.  Clinical Impression  Beth Macdonald is a 76 y.o. female POD 0 s/p R-TKA. Patient reports independence with mobility at baseline. Patient is now limited by functional impairments (see PT problem list below) and requires supervision for bed mobility and min guard for transfers. During standing exercises at EOB prior to taking steps, pt had posterior LOB where her R knee gave out and she sat down unexpectedly on the bed, pt denied dizziness, lightheadedness, or additional pain. Further mobility deferred, RN notified. Patient instructed in exercise to facilitate ROM and circulation to manage edema. Patient will benefit from continued skilled PT interventions to address impairments and progress towards PLOF. Acute PT will follow to progress mobility in preparation for safe discharge home.       Recommendations for follow up therapy are one component of a multi-disciplinary discharge planning process, led by the attending physician.  Recommendations may be updated based on patient status, additional functional criteria and insurance authorization.  Follow Up Recommendations Follow physician's recommendations for discharge plan and follow up therapies    Assistance Recommended at Discharge Intermittent Supervision/Assistance  Patient can return home with the following  A little help with walking and/or transfers;A little help with bathing/dressing/bathroom;Assistance with cooking/housework;Help with stairs or ramp for entrance;Assist for transportation    Equipment Recommendations None recommended by PT (pt has recommended DME)  Recommendations for Other Services       Functional  Status Assessment Patient has had a recent decline in their functional status and demonstrates the ability to make significant improvements in function in a reasonable and predictable amount of time.     Precautions / Restrictions Precautions Precautions: Fall Restrictions Weight Bearing Restrictions: No Other Position/Activity Restrictions: WBAT      Mobility  Bed Mobility Overal bed mobility: Needs Assistance Bed Mobility: Supine to Sit, Sit to Supine     Supine to sit: Supervision, HOB elevated Sit to supine: Supervision, HOB elevated   General bed mobility comments: Pt supervision for bed mobility with HOB elevated, no physical assist required. Educated pt about not putting pillow under knee or elevated knee over opposite leg.    Transfers Overall transfer level: Needs assistance Equipment used: Rolling walker (2 wheels) Transfers: Sit to/from Stand Sit to Stand: From elevated surface, Min guard           General transfer comment: Pt min guard for sit to stand transfer from elevated surface, no physical assist required. Pt completed standing weight-shifts R-L, standing marches bilaterally for 2-3 reps. While standing on RLE and marching LLE up, pt had posterior LOB and sat down on the bed. Pt denied dizziness, nausea, or pain, just that her leg gave out on her. Pt was able to scoot hips up in bed using BUE to return to supine positioning with any increase in symptoms. Further mobility deferred.    Ambulation/Gait               General Gait Details: deferred  Stairs            Wheelchair Mobility    Modified Rankin (Stroke Patients Only)       Balance Overall balance assessment: Needs assistance Sitting-balance support: Feet supported, No upper  extremity supported Sitting balance-Leahy Scale: Fair     Standing balance support: Reliant on assistive device for balance, During functional activity, Bilateral upper extremity supported Standing  balance-Leahy Scale: Poor Standing balance comment: Pt had LOB during standing exercises but was lowered down to bed.                             Pertinent Vitals/Pain Pain Assessment Pain Assessment: 0-10 Pain Score: 4  Pain Location: right knee Pain Descriptors / Indicators: Operative site guarding, Discomfort, Grimacing Pain Intervention(s): Limited activity within patient's tolerance, Monitored during session, Repositioned, Patient requesting pain meds-RN notified, Ice applied    Home Living Family/patient expects to be discharged to:: Private residence Living Arrangements: Spouse/significant other Available Help at Discharge: Family;Available 24 hours/day (wife Clydie Braun) Type of Home: House Home Access: Level entry       Home Layout: One level Home Equipment: Shower seat - built in;Grab bars - Chartered loss adjuster (2 wheels)      Prior Function Prior Level of Function : Independent/Modified Independent             Mobility Comments: ind ADLs Comments: ind     Hand Dominance        Extremity/Trunk Assessment   Upper Extremity Assessment Upper Extremity Assessment: Overall WFL for tasks assessed    Lower Extremity Assessment Lower Extremity Assessment: RLE deficits/detail;LLE deficits/detail RLE Deficits / Details: MMT ank pf/df 5/5, no extensor lag noted RLE Sensation: WNL LLE Deficits / Details: MMT ank pf/df 5/5 LLE Sensation: WNL    Cervical / Trunk Assessment Cervical / Trunk Assessment: Normal  Communication   Communication: No difficulties  Cognition Arousal/Alertness: Awake/alert Behavior During Therapy: WFL for tasks assessed/performed Overall Cognitive Status: Within Functional Limits for tasks assessed                                          General Comments      Exercises Total Joint Exercises Ankle Circles/Pumps: AROM, Both, 10 reps, Supine   Assessment/Plan    PT Assessment Patient needs  continued PT services  PT Problem List Decreased strength;Decreased activity tolerance;Decreased range of motion;Decreased balance;Decreased mobility;Decreased coordination;Decreased knowledge of use of DME;Decreased safety awareness;Pain       PT Treatment Interventions DME instruction;Patient/family education;Neuromuscular re-education;Balance training;Therapeutic exercise;Therapeutic activities;Functional mobility training;Stair training;Gait training    PT Goals (Current goals can be found in the Care Plan section)  Acute Rehab PT Goals Patient Stated Goal: To be able to go to family reunion in [place] in 8 weeks PT Goal Formulation: With patient Time For Goal Achievement: 01/08/22 Potential to Achieve Goals: Good    Frequency 7X/week     Co-evaluation               AM-PAC PT "6 Clicks" Mobility  Outcome Measure Help needed turning from your back to your side while in a flat bed without using bedrails?: None Help needed moving from lying on your back to sitting on the side of a flat bed without using bedrails?: None Help needed moving to and from a bed to a chair (including a wheelchair)?: A Little Help needed standing up from a chair using your arms (e.g., wheelchair or bedside chair)?: A Little Help needed to walk in hospital room?: A Little Help needed climbing 3-5 steps with a railing? : A Lot 6  Click Score: 19    End of Session Equipment Utilized During Treatment: Gait belt Activity Tolerance: Patient limited by pain Patient left: in bed;with call bell/phone within reach;with bed alarm set;with nursing/sitter in room;with family/visitor present;with SCD's reapplied Nurse Communication: Mobility status;Patient requests pain meds PT Visit Diagnosis: Pain;Difficulty in walking, not elsewhere classified (R26.2) Pain - Right/Left: Right Pain - part of body: Knee    Time: 7829-5621 PT Time Calculation (min) (ACUTE ONLY): 32 min   Charges:   PT Evaluation $PT Eval  Low Complexity: 1 Low PT Treatments $Therapeutic Activity: 8-22 mins        Jamesetta Geralds, PT, DPT [facility] Rehabilitation Department Office: [phone] Pager: [phone]  Jamesetta Geralds 01/01/2022, 7:26 PM

## 2022-01-01 NOTE — Progress Notes (Signed)
Assisted Dr. Singer with right, adductor canal, ultrasound guided block. Side rails up, monitors on throughout procedure. See vital signs in flow sheet. Tolerated Procedure well.  

## 2022-01-01 NOTE — Interval H&P Note (Signed)
History and Physical Interval Note: ? ?01/01/2022 ?10:46 AM ? ?Beth Macdonald  has presented today for surgery, with the diagnosis of Right knee osteoarthritis.  The various methods of treatment have been discussed with the patient and family. After consideration of risks, benefits and other options for treatment, the patient has consented to  Procedure(s): ?TOTAL KNEE ARTHROPLASTY (Right) as a surgical intervention.  The patient's history has been reviewed, patient examined, no change in status, stable for surgery.  I have reviewed the patient's chart and labs.  Questions were answered to the patient's satisfaction.   ? ? ?Pilar Plate Enes Rokosz ? ? ?

## 2022-01-01 NOTE — Anesthesia Postprocedure Evaluation (Signed)
Anesthesia Post Note ? ?Patient: Beth Macdonald ? ?Procedure(s) Performed: TOTAL KNEE ARTHROPLASTY (Right: Knee) ? ?  ? ?Patient location during evaluation: PACU ?Anesthesia Type: MAC ?Level of consciousness: awake and alert ?Pain management: pain level controlled ?Vital Signs Assessment: post-procedure vital signs reviewed and stable ?Respiratory status: spontaneous breathing and respiratory function stable ?Cardiovascular status: blood pressure returned to baseline and stable ?Postop Assessment: spinal receding ?Anesthetic complications: no ? ? ?No notable events documented. ? ?Last Vitals:  ?Vitals:  ? 01/01/22 1545 01/01/22 1605  ?BP: (!) 167/64 (!) 179/70  ?Pulse: 61 62  ?Resp: 12   ?Temp: (!) 36.4 ?C (!) 36.4 ?C  ?SpO2: 98% 99%  ?  ?Last Pain:  ?Vitals:  ? 01/01/22 1545  ?TempSrc:   ?PainSc: 0-No pain  ? ? ?  ?  ?  ?  ?  ?  ? ?[name] ? ? ? ? ?

## 2022-01-01 NOTE — Transfer of Care (Signed)
Immediate Anesthesia Transfer of Care Note ? ?Patient: Milayah Krell Kosmicki ? ?Procedure(s) Performed: TOTAL KNEE ARTHROPLASTY (Right: Knee) ? ?Patient Location: PACU ? ?Anesthesia Type:Spinal ? ?Level of Consciousness: awake, alert , oriented and patient cooperative ? ?Airway & Oxygen Therapy: Patient Spontanous Breathing, Patient connected to face mask oxygen and non-rebreather face mask ? ?Post-op Assessment: Report given to RN and Post -op Vital signs reviewed and stable ? ?Post vital signs: Reviewed and stable ? ?Last Vitals:  ?Vitals Value Taken Time  ?BP 147/70 01/01/22 1453  ?Temp    ?Pulse 67 01/01/22 1456  ?Resp 11 01/01/22 1456  ?SpO2 100 % 01/01/22 1456  ?Vitals shown include unvalidated device data. ? ?Last Pain:  ?Vitals:  ? 01/01/22 1206  ?TempSrc:   ?PainSc: 0-No pain  ?   ? ?Patients Stated Pain Goal: 4 (01/01/22 1027) ? ?Complications: No notable events documented. ?

## 2022-01-01 NOTE — Anesthesia Procedure Notes (Signed)
Anesthesia Regional Block: Adductor canal block  ? ?Pre-Anesthetic Checklist: , timeout performed,  Correct Patient, Correct Site, Correct Laterality,  Correct Procedure, Correct Position, site marked,  Risks and benefits discussed,  Surgical consent,  Pre-op evaluation,  At surgeon's request and post-op pain management ? ?Laterality: Right ? ?Prep: chloraprep     ?  ?Needles:  ?Injection technique: Single-shot ? ?Needle Type: Stimulator Needle - 80   ? ? ?Needle Length: 10cm  ?Needle Gauge: 21  ? ? ? ?Additional Needles: ? ? ?Narrative:  ?Start time: 01/01/2022 11:53 AM ?End time: 01/01/2022 12:03 PM ?Injection made incrementally with aspirations every 5 mL. ? ?Performed by: Personally  ?Anesthesiologist: Duane Boston, MD ? ? ? ? ?

## 2022-01-01 NOTE — Discharge Instructions (Signed)
? ?Beth Arabian, MD ?Total Joint Specialist ?[facility] ?[address] ?[place],  [address] ?[phone] ? ?TOTAL KNEE REPLACEMENT POSTOPERATIVE DIRECTIONS ? ? ? ?Knee Rehabilitation, Guidelines Following Surgery  ?Results after knee surgery are often greatly improved when you follow the exercise, range of motion and muscle strengthening exercises prescribed by your doctor. Safety measures are also important to protect the knee from further injury. If any of these exercises cause you to have increased pain or swelling in your knee joint, decrease the amount until you are comfortable again and slowly increase them. If you have problems or questions, call your caregiver or physical therapist for advice.  ? ?BLOOD CLOT PREVENTION ?Take a 10 mg Xarelto once a day for three weeks following surgery. Then discontinue. ?You may resume your vitamins/supplements once you have discontinued the Xarelto. ?Do not take any NSAIDs (Advil, Aleve, Ibuprofen, Meloxicam, etc.) until you have discontinued the Xarelto.  ? ?HOME CARE INSTRUCTIONS  ?Remove items at home which could result in a fall. This includes throw rugs or furniture in walking pathways.  ?ICE to the affected knee as much as tolerated. Icing helps control swelling. If the swelling is well controlled you will be more comfortable and rehab easier. Continue to use ice on the knee for pain and swelling from surgery. You may notice swelling that will progress down to the foot and ankle. This is normal after surgery. Elevate the leg when you are not up walking on it.    ?Continue to use the breathing machine which will help keep your temperature down. It is common for your temperature to cycle up and down following surgery, especially at night when you are not up moving around and exerting yourself. The breathing machine keeps your lungs expanded and your temperature down. ?Do not place pillow under the operative knee, focus on keeping  the knee straight while resting ? ?DIET ?You may resume your previous home diet once you are discharged from the hospital. ? ?DRESSING / WOUND CARE / SHOWERING ?Keep your bulky bandage on for 2 days. On the third post-operative day you may remove the Ace bandage and gauze. There is a waterproof adhesive bandage on your skin which will stay in place until your first follow-up appointment. Once you remove this you will not need to place another bandage ?You may begin showering 3 days following surgery, but do not submerge the incision under water. ? ?ACTIVITY ?For the first 5 days, the key is rest and control of pain and swelling ?Do your home exercises twice a day starting on post-operative day 3. On the days you go to physical therapy, just do the home exercises once that day. ?You should rest, ice and elevate the leg for 50 minutes out of every hour. Get up and walk/stretch for 10 minutes per hour. After 5 days you can increase your activity slowly as tolerated. ?Walk with your walker as instructed. Use the walker until you are comfortable transitioning to a cane. Walk with the cane in the opposite hand of the operative leg. You may discontinue the cane once you are comfortable and walking steadily. ?Avoid periods of inactivity such as sitting longer than an hour when not asleep. This helps prevent blood clots.  ?You may discontinue the knee immobilizer once you are able to perform a straight leg raise while lying down. ?You may resume a sexual relationship in one month or when given the OK by your doctor.  ?You may return to work  once you are cleared by your doctor.  ?Do not drive a car for 6 weeks or until released by your surgeon.  ?Do not drive while taking narcotics. ? ?TED HOSE STOCKINGS ?Wear the elastic stockings on both legs for three weeks following surgery during the day. You may remove them at night for sleeping. ? ?WEIGHT BEARING ?Weight bearing as tolerated with assist device (walker, cane, etc) as  directed, use it as long as suggested by your surgeon or therapist, typically at least 4-6 weeks. ? ?POSTOPERATIVE CONSTIPATION PROTOCOL ?Constipation - defined medically as fewer than three stools per week and severe constipation as less than one stool per week. ? ?One of the most common issues patients have following surgery is constipation.  Even if you have a regular bowel pattern at home, your normal regimen is likely to be disrupted due to multiple reasons following surgery.  Combination of anesthesia, postoperative narcotics, change in appetite and fluid intake all can affect your bowels.  In order to avoid complications following surgery, here are some recommendations in order to help you during your recovery period. ? ?Colace (docusate) - Pick up an over-the-counter form of Colace or another stool softener and take twice a day as long as you are requiring postoperative pain medications.  Take with a full glass of water daily.  If you experience loose stools or diarrhea, hold the colace until you stool forms back up. If your symptoms do not get better within 1 week or if they get worse, check with your doctor. ?Dulcolax (bisacodyl) - Pick up over-the-counter and take as directed by the product packaging as needed to assist with the movement of your bowels.  Take with a full glass of water.  Use this product as needed if not relieved by Colace only.  ?MiraLax (polyethylene glycol) - Pick up over-the-counter to have on hand. MiraLax is a solution that will increase the amount of water in your bowels to assist with bowel movements.  Take as directed and can mix with a glass of water, juice, soda, coffee, or tea. Take if you go more than two days without a movement. Do not use MiraLax more than once per day. Call your doctor if you are still constipated or irregular after using this medication for 7 days in a row. ? ?If you continue to have problems with postoperative constipation, please contact the office for  further assistance and recommendations.  If you experience "the worst abdominal pain ever" or develop nausea or vomiting, please contact the office immediatly for further recommendations for treatment. ? ?ITCHING ?If you experience itching with your medications, try taking only a single pain pill, or even half a pain pill at a time.  You can also use Benadryl over the counter for itching or also to help with sleep.  ? ?MEDICATIONS ?See your medication summary on the ?After Visit Summary? that the nursing staff will review with you prior to discharge.  You may have some home medications which will be placed on hold until you complete the course of blood thinner medication.  It is important for you to complete the blood thinner medication as prescribed by your surgeon.  Continue your approved medications as instructed at time of discharge. ? ?PRECAUTIONS ?If you experience chest pain or shortness of breath - call 911 immediately for transfer to the hospital emergency department.  ?If you develop a fever greater that 101 F, purulent drainage from wound, increased redness or drainage from wound, foul odor from the  wound/dressing, or calf pain - CONTACT YOUR SURGEON.   ?                                                ?FOLLOW-UP APPOINTMENTS ?Make sure you keep all of your appointments after your operation with your surgeon and caregivers. You should call the office at the above phone number and make an appointment for approximately two weeks after the date of your surgery or on the date instructed by your surgeon outlined in the "After Visit Summary". ? ?RANGE OF MOTION AND STRENGTHENING EXERCISES  ?Rehabilitation of the knee is important following a knee injury or an operation. After just a few days of immobilization, the muscles of the thigh which control the knee become weakened and shrink (atrophy). Knee exercises are designed to build up the tone and strength of the thigh muscles and to improve knee motion. Often  times heat used for twenty to thirty minutes before working out will loosen up your tissues and help with improving the range of motion but do not use heat for the first two weeks following surgery. These

## 2022-01-01 NOTE — Progress Notes (Signed)
Orthopedic Tech Progress Note ?Patient Details:  ?Beth Macdonald ?1946-08-23 ?[id number] ? ?CPM Right Knee ?CPM Right Knee: On ?Right Knee Flexion (Degrees): 40 ?Right Knee Extension (Degrees): 10 ? ?Post Interventions ?Patient Tolerated: Well ? ?Beth Macdonald ?01/01/2022, 3:13 PM ? ?

## 2022-01-01 NOTE — Op Note (Signed)
OPERATIVE REPORT-TOTAL KNEE ARTHROPLASTY ? ? ?Pre-operative diagnosis- Osteoarthritis  Right knee(s) ? ?Post-operative diagnosis- Osteoarthritis Right knee(s) ? ?Procedure-  Right  Total Knee Arthroplasty ? ?Surgeon- Dione Plover. Blanca Thornton, MD ? ?Assistant- Theresa Duty, PA-C  ? ?Anesthesia-   Adductor canal block and spinal ? ?EBL-20 mL ?  ?Drains None ? ?Tourniquet time-  ?Total Tourniquet Time Documented: ?Thigh (Right) - 33 minutes ?Total: Thigh (Right) - 33 minutes ?   ? ?Complications- None ? ?Condition-PACU - hemodynamically stable.  ? ?Brief Clinical Note  Beth Macdonald is a 76 y.o. year old female with end stage OA of her right knee with progressively worsening pain and dysfunction. She has constant pain, with activity and at rest and significant functional deficits with difficulties even with ADLs. She has had extensive non-op management including analgesics, injections of cortisone and viscosupplements, and home exercise program, but remains in significant pain with significant dysfunction.Radiographs show bone on bone arthritis lateral and patellofeoral. She presents now for right Total Knee Arthroplasty.    ? ?Procedure in detail--- ? ? The patient is brought into the operating room and positioned supine on the operating table. After successful administration of  Adductor canal block and spinal,   a tourniquet is placed high on the  Right thigh(s) and the lower extremity is prepped and draped in the usual sterile fashion. Time out is performed by the operating team and then the  Right lower extremity is wrapped in Esmarch, knee flexed and the tourniquet inflated to 300 mmHg.  ?     A midline incision is made with a ten blade through the subcutaneous tissue to the level of the extensor mechanism. A fresh blade is used to make a medial parapatellar arthrotomy. Soft tissue over the proximal medial tibia is subperiosteally elevated to the joint line with a knife and into the semimembranosus bursa with  a Cobb elevator. Soft tissue over the proximal lateral tibia is elevated with attention being paid to avoiding the patellar tendon on the tibial tubercle. The patella is everted, knee flexed 90 degrees and the ACL and PCL are removed. Findings are bone on bone lateral and patellofemoral with large global osteophytes.   ?     The drill is used to create a starting hole in the distal femur and the canal is thoroughly irrigated with sterile saline to remove the fatty contents. The 5 degree Right  valgus alignment guide is placed into the femoral canal and the distal femoral cutting block is pinned to remove 9 mm off the distal femur. Resection is made with an oscillating saw. ?     The tibia is subluxed forward and the menisci are removed. The extramedullary alignment guide is placed referencing proximally at the medial aspect of the tibial tubercle and distally along the second metatarsal axis and tibial crest. The block is pinned to remove 40m off the more deficient lateral  side. Resection is made with an oscillating saw. Size 6is the most appropriate size for the tibia and the proximal tibia is prepared with the modular drill and keel punch for that size. ?     The femoral sizing guide is placed and size 6 is most appropriate. Rotation is marked off the epicondylar axis and confirmed by creating a rectangular flexion gap at 90 degrees. The size 6 cutting block is pinned in this rotation and the anterior, posterior and chamfer cuts are made with the oscillating saw. The intercondylar block is then placed and that cut is made. ?  Trial size 6 tibial component, trial size 6 posterior stabilized femur and a 6  mm posterior stabilized rotating platform insert trial is placed. Full extension is achieved with excellent varus/valgus and anterior/posterior balance throughout full range of motion. The patella is everted and thickness measured to be 22  mm. Free hand resection is taken to 12 mm, a 38 template is placed,  lug holes are drilled, trial patella is placed, and it tracks normally. Osteophytes are removed off the posterior femur with the trial in place. All trials are removed and the cut bone surfaces prepared with pulsatile lavage. Cement is mixed and once ready for implantation, the size 6 tibial implant, size  6 posterior stabilized femoral component, and the size 38 patella are cemented in place and the patella is held with the clamp. The trial insert is placed and the knee held in full extension. The Exparel (20 ml mixed with 60 ml saline) is injected into the extensor mechanism, posterior capsule, medial and lateral gutters and subcutaneous tissues.  All extruded cement is removed and once the cement is hard the permanent 6 mm posterior stabilized rotating platform insert is placed into the tibial tray. ?     The wound is copiously irrigated with saline solution and the extensor mechanism closed with # 0 Stratofix suture. The tourniquet is released for a total tourniquet time of 33  minutes. Flexion against gravity is 140 degrees and the patella tracks normally. Subcutaneous tissue is closed with 2.0 vicryl and subcuticular with running 4.0 Monocryl. The incision is cleaned and dried and steri-strips and a bulky sterile dressing are applied. The limb is placed into a knee immobilizer and the patient is awakened and transported to recovery in stable condition. ?     Please note that a surgical assistant was a medical necessity for this procedure in order to perform it in a safe and expeditious manner. Surgical assistant was necessary to retract the ligaments and vital neurovascular structures to prevent injury to them and also necessary for proper positioning of the limb to allow for anatomic placement of the prosthesis. ? ? ?Dione Plover Kenwood Rosiak, MD ? ? ? ?01/01/2022, 2:25 PM ? ? ?

## 2022-01-01 NOTE — Anesthesia Preprocedure Evaluation (Addendum)
Anesthesia Evaluation  ?Patient identified by MRN, date of birth, ID band ?Patient awake ? ? ? ?Reviewed: ?Allergy & Precautions, NPO status , Patient's Chart, lab work & pertinent test results ? ?Airway ?Mallampati: II ? ?TM Distance: >3 FB ?Neck ROM: Full ? ? ? Dental ?no notable dental hx. ?(+) Dental Advisory Given ?  ?Pulmonary ?neg pulmonary ROS, former smoker,  ?  ?Pulmonary exam normal ? ? ? ? ? ? ? Cardiovascular ?hypertension, Pt. on medications ?Normal cardiovascular exam+ Valvular Problems/Murmurs MR  ? ?Echocardiogram 03/21/2020:  ?Left ventricle cavity is normal in size. Mild concentric hypertrophy of  ?the left ventricle. Normal global wall motion. Normal LV systolic function  ?with EF 58%. Doppler evidence of grade I (impaired) diastolic dysfunction,  ?normal LAP.  ?Trileaflet aortic valve with mild aortic valve leaflet calcification. Mild  ?aortic valve stenosis. No regurgitation. Aortic valve mean gradient of 8  ?mmHg, Vmax of 2 m/s. Calculated aortic valve area by continuity equation  ?is 2.3 cm?Marland Kitchen  ?Moderate (Grade II) mitral regurgitation.  ?Mild to moderate tricuspid regurgitation.  ?IVC is dilated with respiratory variation. Estimated RA pressure 8 mmHg. ?  ?Neuro/Psych ?Facial nerve injury ?negative neurological ROS ?   ? GI/Hepatic ?negative GI ROS, Neg liver ROS,   ?Endo/Other  ?diabetes ? Renal/GU ?negative Renal ROS  ? ?  ?Musculoskeletal ?negative musculoskeletal ROS ?(+)  ? Abdominal ?  ?Peds ? Hematology ?negative hematology ROS ?(+)   ?Anesthesia Other Findings ? ? Reproductive/Obstetrics ? ?  ? ? ? ? ? ? ? ? ? ? ? ? ? ?  ?  ? ? ? ? ? ? ? ? ?Anesthesia Physical ?Anesthesia Plan ? ?ASA: 3 ? ?Anesthesia Plan: Spinal and MAC  ? ?Post-op Pain Management: Regional block* and Ofirmev IV (intra-op)*  ? ?Induction: Intravenous ? ?PONV Risk Score and Plan: 2 and Ondansetron and Propofol infusion ? ?Airway Management Planned: Natural Airway and Simple Face  Mask ? ?Additional Equipment:  ? ?Intra-op Plan:  ? ?Post-operative Plan:  ? ?Informed Consent: I have reviewed the patients History and Physical, chart, labs and discussed the procedure including the risks, benefits and alternatives for the proposed anesthesia with the patient or authorized representative who has indicated his/her understanding and acceptance.  ? ? ? ?Dental advisory given ? ?Plan Discussed with: Anesthesiologist ? ?Anesthesia Plan Comments:   ? ? ? ? ? ? ?Anesthesia Quick Evaluation ? ?

## 2022-01-02 ENCOUNTER — Other Ambulatory Visit: Payer: Self-pay

## 2022-01-02 ENCOUNTER — Encounter (HOSPITAL_COMMUNITY): Payer: Self-pay | Admitting: Orthopedic Surgery

## 2022-01-02 DIAGNOSIS — Z85828 Personal history of other malignant neoplasm of skin: Secondary | ICD-10-CM | POA: Diagnosis not present

## 2022-01-02 DIAGNOSIS — M1711 Unilateral primary osteoarthritis, right knee: Secondary | ICD-10-CM | POA: Diagnosis not present

## 2022-01-02 DIAGNOSIS — E119 Type 2 diabetes mellitus without complications: Secondary | ICD-10-CM | POA: Diagnosis not present

## 2022-01-02 DIAGNOSIS — Z7984 Long term (current) use of oral hypoglycemic drugs: Secondary | ICD-10-CM | POA: Diagnosis not present

## 2022-01-02 DIAGNOSIS — Z8673 Personal history of transient ischemic attack (TIA), and cerebral infarction without residual deficits: Secondary | ICD-10-CM | POA: Diagnosis not present

## 2022-01-02 DIAGNOSIS — Z79899 Other long term (current) drug therapy: Secondary | ICD-10-CM | POA: Diagnosis not present

## 2022-01-02 LAB — CBC
HCT: 36.8 % (ref 36.0–46.0)
Hemoglobin: 12.8 g/dL (ref 12.0–15.0)
MCH: 32 pg (ref 26.0–34.0)
MCHC: 34.8 g/dL (ref 30.0–36.0)
MCV: 92 fL (ref 80.0–100.0)
Platelets: 231 10*3/uL (ref 150–400)
RBC: 4 MIL/uL (ref 3.87–5.11)
RDW: 12.7 % (ref 11.5–15.5)
WBC: 15.4 10*3/uL — ABNORMAL HIGH (ref 4.0–10.5)
nRBC: 0 % (ref 0.0–0.2)

## 2022-01-02 LAB — BASIC METABOLIC PANEL
Anion gap: 11 (ref 5–15)
BUN: 23 mg/dL (ref 8–23)
CO2: 24 mmol/L (ref 22–32)
Calcium: 8.5 mg/dL — ABNORMAL LOW (ref 8.9–10.3)
Chloride: 103 mmol/L (ref 98–111)
Creatinine, Ser: 0.96 mg/dL (ref 0.44–1.00)
GFR, Estimated: >60 mL/min (ref >60)
Glucose, Bld: 170 mg/dL — ABNORMAL HIGH (ref 70–99)
Potassium: 3.9 mmol/L (ref 3.5–5.1)
Sodium: 138 mmol/L (ref 135–145)

## 2022-01-02 LAB — GLUCOSE, CAPILLARY
Glucose-Capillary: 134 mg/dL — ABNORMAL HIGH (ref 70–99)
Glucose-Capillary: 123 mg/dL — ABNORMAL HIGH (ref 70–99)

## 2022-01-02 MED ORDER — OXYCODONE HCL 5 MG PO TABS
5.0000 mg | ORAL_TABLET | Freq: Four times a day (QID) | ORAL | 0 refills | Status: DC | PRN
Start: 2022-01-02 — End: 2022-01-02

## 2022-01-02 MED ORDER — TRAMADOL HCL 50 MG PO TABS: 50.0000 mg | ORAL_TABLET | Freq: Four times a day (QID) | ORAL | 0 refills | Status: DC | PRN

## 2022-01-02 MED ORDER — HYDROMORPHONE HCL 2 MG PO TABS: 2.0000 mg | ORAL_TABLET | Freq: Four times a day (QID) | ORAL | 0 refills | Status: DC | PRN

## 2022-01-02 MED ORDER — ONDANSETRON HCL 4 MG PO TABS: 4.0000 mg | ORAL_TABLET | Freq: Four times a day (QID) | ORAL | 0 refills | Status: DC | PRN

## 2022-01-02 MED ORDER — OXYCODONE HCL 5 MG PO TABS: 5.0000 mg | ORAL_TABLET | Freq: Four times a day (QID) | ORAL | 0 refills | Status: DC | PRN

## 2022-01-02 MED ORDER — OXYCODONE HCL 5 MG PO TABS
5.0000 mg | ORAL_TABLET | ORAL | Status: DC | PRN
  Administered 2022-01-02: 10 mg via ORAL
  Filled 2022-01-02: qty 2

## 2022-01-02 MED ORDER — METHOCARBAMOL 500 MG PO TABS: 500.0000 mg | ORAL_TABLET | Freq: Four times a day (QID) | ORAL | 0 refills | Status: DC | PRN

## 2022-01-02 MED ORDER — HYDROMORPHONE HCL 2 MG PO TABS
2.0000 mg | ORAL_TABLET | ORAL | Status: DC | PRN
  Administered 2022-01-02: 2 mg via ORAL
  Filled 2022-01-02: qty 1

## 2022-01-02 MED ORDER — RIVAROXABAN 10 MG PO TABS: 10.0000 mg | ORAL_TABLET | Freq: Every day | ORAL | 0 refills | Status: AC

## 2022-01-02 NOTE — Care Plan (Signed)
Ortho Bundle Case Management Note ? ?Patient Details  ?Name: Beth Macdonald ?MRN: [id number] ?Date of Birth: 27-Apr-1946 ? ?R TKA on 01-01-22 ?DCP:  Home with wife ?DME:  RW ordered through [place] ?PTLady Gary PT on 01-04-22                ? ? ? ?DME Arranged:  Walker rolling ?DME Agency:  [facility] ? ?HH Arranged:  NA ?[place] Agency:  NA ? ?Additional Comments: ?Please contact me with any questions of if this plan should need to change. ? ?Larwance Rote  [phone] ?01/02/2022, 7:00 AM ?  ?

## 2022-01-02 NOTE — Progress Notes (Signed)
Physical Therapy Treatment ?Patient Details ?Name: Beth Macdonald ?MRN: [id number] ?DOB: [birth year] ?Today's Date: 01/02/2022 ? ? ?History of Present Illness Pt is a 76yo female presenting s/p R-TKA on 01/01/22. PMH: DM with neuropathy, hx of DVT, bell's palsy, HLD, OA, Hx of TIA in 2020, bilateral shoulder surgery 2019. ? ?  ?PT Comments  ? ? Patient making excellent progress with mobility and ambulated ~100' with min guard/supervision. Pt's spouse present and provided safe guarding throughout with no LOB noted and good recall from pt for safe management of walker. Reviewed supine/seated exercises in HEP and address questions for discharge. Pt is mobilizing at safe level to return home with assist from family/spouse. Will continue to progress during acute stay. ? ?  ?Recommendations for follow up therapy are one component of a multi-disciplinary discharge planning process, led by the attending physician.  Recommendations may be updated based on patient status, additional functional criteria and insurance authorization. ? ?Follow Up Recommendations ? Follow physician's recommendations for discharge plan and follow up therapies ?  ?  ?Assistance Recommended at Discharge Intermittent Supervision/Assistance  ?Patient can return home with the following A little help with walking and/or transfers;A little help with bathing/dressing/bathroom;Assistance with cooking/housework;Help with stairs or ramp for entrance;Assist for transportation ?  ?Equipment Recommendations ? Rolling walker (2 wheels)  ?  ?Recommendations for Other Services   ? ? ?  ?Precautions / Restrictions Precautions ?Precautions: Fall ?Restrictions ?Weight Bearing Restrictions: No ?Other Position/Activity Restrictions: WBAT  ?  ? ?Mobility ? Bed Mobility ?Overal bed mobility: Needs Assistance ?Bed Mobility: Supine to Sit ?  ?  ?Supine to sit: Supervision, HOB elevated ?  ?  ?General bed mobility comments: pt OOB in bathroom then ended in recliner ?   ? ?Transfers ?Overall transfer level: Needs assistance ?Equipment used: Rolling walker (2 wheels) ?Transfers: Sit to/from Stand ?Sit to Stand: From elevated surface, Min guard ?  ?  ?  ?  ?  ?General transfer comment: pt' swife present and educated on safe guarding. sueprvision/guarding for sit<>stand from toilet and for return to sit in recliner. ?  ? ?Ambulation/Gait ?Ambulation/Gait assistance: Min guard, Supervision ?Gait Distance (Feet): 100 Feet ?Assistive device: Rolling walker (2 wheels) ?Gait Pattern/deviations: Step-to pattern, Decreased stride length, Decreased weight shift to right ?Gait velocity: decr ?  ?  ?General Gait Details: pt demonstrated good recall for safe step pattern and proximity to RW throughout. no LOB noted and pt's wife provided safe guarding with supervision from therapist. ? ? ?Stairs ?  ?  ?  ?  ?  ? ? ?Wheelchair Mobility ?  ? ?Modified Rankin (Stroke Patients Only) ?  ? ? ?  ?Balance Overall balance assessment: Needs assistance ?Sitting-balance support: Feet supported, No upper extremity supported ?Sitting balance-Leahy Scale: Good ?  ?  ?Standing balance support: Reliant on assistive device for balance, During functional activity, Bilateral upper extremity supported ?Standing balance-Leahy Scale: Poor ?  ?  ?  ?  ?  ?  ?  ?  ?  ?  ?  ?  ?  ? ?  ?Cognition Arousal/Alertness: Awake/alert ?Behavior During Therapy: [facility] for tasks assessed/performed ?Overall Cognitive Status: Within Functional Limits for tasks assessed ?  ?  ?  ?  ?  ?  ?  ?  ?  ?  ?  ?  ?  ?  ?  ?  ?  ?  ?  ? ?  ?Exercises Total Joint Exercises ?Ankle Circles/Pumps: AROM, Both, 10 reps ?Quad Sets: AROM,  Both, 10 reps ?Heel Slides: Both, 10 reps, AAROM ? ?  ?General Comments   ?  ?  ? ?Pertinent Vitals/Pain Pain Assessment ?Pain Assessment: 0-10 ?Pain Score: 7  ?Pain Location: Rt knee ?Pain Descriptors / Indicators: Operative site guarding, Discomfort, Grimacing ?Pain Intervention(s): Limited activity within patient's  tolerance, Monitored during session, Repositioned  ? ? ?Home Living   ?  ?  ?  ?  ?  ?  ?  ?  ?  ?   ?  ?Prior Function    ?  ?  ?   ? ?PT Goals (current goals can now be found in the care plan section) Acute Rehab PT Goals ?Patient Stated Goal: To be able to go to family reunion in [place] in 32 weeks ?PT Goal Formulation: With patient ?Time For Goal Achievement: 01/08/22 ?Potential to Achieve Goals: Good ?Progress towards PT goals: Progressing toward goals ? ?  ?Frequency ? ? ? 7X/week ? ? ? ?  ?PT Plan Current plan remains appropriate  ? ? ?Co-evaluation   ?  ?  ?  ?  ? ?  ?AM-PAC PT "6 Clicks" Mobility   ?Outcome Measure ? Help needed turning from your back to your side while in a flat bed without using bedrails?: None ?Help needed moving from lying on your back to sitting on the side of a flat bed without using bedrails?: None ?Help needed moving to and from a bed to a chair (including a wheelchair)?: A Little ?Help needed standing up from a chair using your arms (e.g., wheelchair or bedside chair)?: A Little ?Help needed to walk in hospital room?: A Little ?Help needed climbing 3-5 steps with a railing? : A Little ?6 Click Score: 20 ? ?  ?End of Session Equipment Utilized During Treatment: Gait belt ?Activity Tolerance: Patient limited by pain ?Patient left: in bed;with call bell/phone within reach;with bed alarm set;with nursing/sitter in room;with family/visitor present;with SCD's reapplied ?Nurse Communication: Mobility status;Patient requests pain meds ?PT Visit Diagnosis: Pain;Difficulty in walking, not elsewhere classified (R26.2) ?Pain - Right/Left: Right ?Pain - part of body: Knee ?  ? ? ?Time: 8250-5397 ?PT Time Calculation (min) (ACUTE ONLY): 25 min ? ?Charges:  $Gait Training: 8-22 mins ?$Therapeutic Exercise: 8-22 mins          ?          ? ?Gwynneth Albright PT, DPT ?Acute Rehabilitation Services ?Office [phone] ?Pager [phone]  ? ? ?Beth Macdonald ?01/02/2022, 2:42 PM ? ?

## 2022-01-02 NOTE — TOC Transition Note (Signed)
Transition of Care (TOC) - CM/SW Discharge Note ? ?Patient Details  ?Name: Beth Macdonald ?MRN: [id number] ?Date of Birth: 05-07-1946 ? ?Transition of Care (TOC) CM/SW Contact:  ?Sherie Don, LCSW ?Phone Number: ?01/02/2022, 10:05 AM ? ?Clinical Narrative: Patient is expected to discharge home after working with PT. CSW met with patient to review discharge plan and needs. Patient will go home with OPPT at [facility] PT. Patient needs a rolling walker, which was delivered to her room by [employer]. TOC signing off. ? ?Final next level of care: OP Rehab ?Barriers to Discharge: No Barriers Identified ? ?Patient Goals and CMS Choice ?Patient states their goals for this hospitalization and ongoing recovery are:: Discharge home with OPPT at [facility] PT ?CMS Medicare.gov Compare Post Acute Care list provided to:: Patient ?Choice offered to / list presented to : Patient ? ?Discharge Plan and Services         ?DME Arranged: Walker rolling ?DME Agency: [employer] ?Representative spoke with at DME Agency: Prearranged in orthopedist's office ?HH Arranged: NA ?[place] Agency: NA ? ?Readmission Risk Interventions ?   ? View : No data to display.  ?  ?  ?  ? ?

## 2022-01-02 NOTE — Plan of Care (Signed)

## 2022-01-02 NOTE — Progress Notes (Signed)
? ?Subjective: ?1 Day Post-Op Procedure(s) (LRB): ?TOTAL KNEE ARTHROPLASTY (Right) ?Patient reports pain as moderate.   ?Patient seen in rounds by Dr. Wynelle Link. ?Patient had some issues with vomiting yesterday, improved this morning. Reports pain in the right knee. Denies chest pain or SOB. Refused catheter removal this AM. ?We will continue therapy today, deferred ambulation yesterday. ? ?Objective: ?Vital signs in last 24 hours: ?Temp:  [97.5 ?F (36.4 ?C)-98 ?F (36.7 ?C)] 97.8 ?F (36.6 ?C) (05/02 0436) ?Pulse Rate:  [61-80] 72 (05/02 0436) ?Resp:  [12-21] 18 (05/02 0436) ?BP: (147-196)/(62-82) 180/82 (05/02 0436) ?SpO2:  [95 %-100 %] 98 % (05/02 0436) ?Weight:  [79.4 kg] 79.4 kg (05/01 1027) ? ?Intake/Output from previous day: ? ?Intake/Output Summary (Last 24 hours) at 01/02/2022 0815 ?Last data filed at 01/02/2022 0600 ?Gross per 24 hour  ?Intake 2541.85 ml  ?Output 3861 ml  ?Net -1319.15 ml  ?  ? ?Intake/Output this shift: ?No intake/output data recorded. ? ?Labs: ?Recent Labs  ?  01/02/22 ?4627  ?HGB 12.8  ? ?Recent Labs  ?  01/02/22 ?0350  ?WBC 15.4*  ?RBC 4.00  ?HCT 36.8  ?PLT 231  ? ?Recent Labs  ?  01/02/22 ?0938  ?NA 138  ?K 3.9  ?CL 103  ?CO2 24  ?BUN 23  ?CREATININE 0.96  ?GLUCOSE 170*  ?CALCIUM 8.5*  ? ?No results for input(s): LABPT, INR in the last 72 hours. ? ?Exam: ?General - Patient is Alert and Oriented ?Extremity - Neurologically intact ?Neurovascular intact ?Sensation intact distally ?Dorsiflexion/Plantar flexion intact ?Dressing - dressing C/D/I ?Motor Function - intact, moving foot and toes well on exam.  ? ?Past Medical History:  ?Diagnosis Date  ? Aortic atherosclerosis ([place])   ? Diabetes mellitus without complication ([place])   ? Type 2  ? Diabetic neuropathy ([place])   ? DVT of proximal lower limb ([place])   ? Facial paralysis/Bells palsy   ? 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 Hyperlipidemia   ? Mitral valve insufficiency   ? Osteoarthritis   ? SCCA (squamous cell carcinoma) of skin  03/22/2021  ? Bridge of nose (in situ)  ? Skull fracture ([place])   ? age 76  ? Squamous cell carcinoma of skin 03/22/2021  ? Right forehead (in situ)  ? TIA (transient ischemic attack) 2020  ? Varicose vein of leg   ? ? ?Assessment/Plan: ?1 Day Post-Op Procedure(s) (LRB): ?TOTAL KNEE ARTHROPLASTY (Right) ?Principal Problem: ?  OA (osteoarthritis) of knee ?Active Problems: ?  Osteoarthritis of right knee ? ?Estimated body mass index is 26.61 kg/m? as calculated from the following: ?  Height as of this encounter: '5\' 8"'$  (1.727 m). ?  Weight as of this encounter: 79.4 kg. ?Advance diet ?Up with therapy ?D/C IV fluids ? ? ?Patient's anticipated LOS is less than 2 midnights, meeting these requirements: ?- Lives within 1 hour of care ?- Has a competent adult at home to recover with post-op recover ?- NO history of ? - Chronic pain requiring opioids ? - Coronary Artery Disease ? - Heart failure ? - Heart attack ? - Stroke ? - DVT/VTE ? - Cardiac arrhythmia ? - Respiratory Failure/COPD ? - Renal failure ? - Anemia ? - Advanced Liver disease ? ?DVT Prophylaxis - Xarelto ?Weight bearing as tolerated. ?Continue therapy. ? ?Foley catheter to be removed after first session of therapy this AM. ? ?Plan is to go Home after hospital stay. ?Possible discharge this afternoon if progresses with therapy and meeting goals. ?Scheduled for  OP[facility] with [facility]. ?Follow-up in the office in 2 weeks. ? ?The PDMP database was reviewed today prior to any opioid medications being prescribed to this patient. ? ?Theresa Duty, PA-C ?Orthopedic Surgery ?[phone] ?01/02/2022, 8:15 AM ? ?

## 2022-01-02 NOTE — Progress Notes (Addendum)
Physical Therapy Treatment ?Patient Details ?Name: Beth Macdonald ?MRN: [id number] ?DOB: 10-05-1945 ?Today's Date: 01/02/2022 ? ? ?History of Present Illness Pt is a 76yo female presenting s/p R-TKA on 01/01/22. PMH: DM with neuropathy, hx of DVT, bell's palsy, HLD, OA, Hx of TIA in 2020, bilateral shoulder surgery 2019. ? ?  ?PT Comments  ? ? Patient making good progress with mobility and was able to stand and ambulate with min guard/assist and no LOB or buckling at Rt knee. Pt ambulated ~50' with Min assist and VC's for safe sequencing. Educated at [facility] on exercises for ROM and circulation. Will follow up for additional session to progress gait training with family/spouse present. ? ?  ?Recommendations for follow up therapy are one component of a multi-disciplinary discharge planning process, led by the attending physician.  Recommendations may be updated based on patient status, additional functional criteria and insurance authorization. ? ?Follow Up Recommendations ? Follow physician's recommendations for discharge plan and follow up therapies ?  ?  ?Assistance Recommended at Discharge Intermittent Supervision/Assistance  ?Patient can return home with the following A little help with walking and/or transfers;A little help with bathing/dressing/bathroom;Assistance with cooking/housework;Help with stairs or ramp for entrance;Assist for transportation ?  ?Equipment Recommendations ? Rolling walker (2 wheels)  ?  ?Recommendations for Other Services   ? ? ?  ?Precautions / Restrictions Precautions ?Precautions: Fall ?Restrictions ?Weight Bearing Restrictions: No ?Other Position/Activity Restrictions: WBAT  ?  ? ?Mobility ? Bed Mobility ?Overal bed mobility: Needs Assistance ?Bed Mobility: Supine to Sit ?  ?  ?Supine to sit: Supervision, HOB elevated ?  ?  ?General bed mobility comments: Pt supervision for bed mobility with HOB elevated, no physical assist required, pt using UE's to bring Rt LE off EOB. ?   ? ?Transfers ?Overall transfer level: Needs assistance ?Equipment used: Rolling walker (2 wheels) ?Transfers: Sit to/from Stand ?Sit to Stand: From elevated surface, Min guard ?  ?  ?  ?  ?  ?General transfer comment: close guarding for safety, VC's for hand placement to improve power up and hand transition to RW. ?  ? ?Ambulation/Gait ?Ambulation/Gait assistance: Min assist ?Gait Distance (Feet): 50 Feet ?Assistive device: Rolling walker (2 wheels) ?Gait Pattern/deviations: Step-to pattern, Decreased stride length, Decreased weight shift to right ?Gait velocity: decr ?  ?  ?General Gait Details: cues for step pattern and proximity to RW, no buckling or LOB noted at Rt knee. pt c/o pain but able to progress. Min assist to reposition walker intermittently. ? ? ?Stairs ?  ?  ?  ?  ?  ? ? ?Wheelchair Mobility ?  ? ?Modified Rankin (Stroke Patients Only) ?  ? ? ?  ?Balance Overall balance assessment: Needs assistance ?Sitting-balance support: Feet supported, No upper extremity supported ?Sitting balance-Leahy Scale: Good ?  ?  ?Standing balance support: Reliant on assistive device for balance, During functional activity, Bilateral upper extremity supported ?Standing balance-Leahy Scale: Poor ?  ?  ?  ?  ?  ?  ?  ?  ?  ?  ?  ?  ?  ? ?  ?Cognition Arousal/Alertness: Awake/alert ?Behavior During Therapy: [facility] for tasks assessed/performed ?Overall Cognitive Status: Within Functional Limits for tasks assessed ?  ?  ?  ?  ?  ?  ?  ?  ?  ?  ?  ?  ?  ?  ?  ?  ?  ?  ?  ? ?  ?Exercises Total Joint Exercises ?Ankle Circles/Pumps: AROM, Both,  10 reps ?Quad Sets: AROM, Both, 10 reps ?Heel Slides: Both, 10 reps, AAROM ? ?  ?General Comments   ?  ?  ? ?Pertinent Vitals/Pain Pain Assessment ?Pain Assessment: 0-10 ?Pain Score: 7  ?Pain Location: right knee ?Pain Descriptors / Indicators: Operative site guarding, Discomfort, Grimacing ?Pain Intervention(s): Limited activity within patient's tolerance, Monitored during session,  Repositioned, Premedicated before session, Ice applied  ? ? ?Home Living   ?  ?  ?  ?  ?  ?  ?  ?  ?  ?   ?  ?Prior Function    ?  ?  ?   ? ?PT Goals (current goals can now be found in the care plan section) Acute Rehab PT Goals ?Patient Stated Goal: To be able to go to family reunion in [place] in 49 weeks ?PT Goal Formulation: With patient ?Time For Goal Achievement: 01/08/22 ?Potential to Achieve Goals: Good ?Progress towards PT goals: Progressing toward goals ? ?  ?Frequency ? ? ? 7X/week ? ? ? ?  ?PT Plan Current plan remains appropriate  ? ? ?Co-evaluation   ?  ?  ?  ?  ? ?  ?AM-PAC PT "6 Clicks" Mobility   ?Outcome Measure ? Help needed turning from your back to your side while in a flat bed without using bedrails?: None ?Help needed moving from lying on your back to sitting on the side of a flat bed without using bedrails?: None ?Help needed moving to and from a bed to a chair (including a wheelchair)?: A Little ?Help needed standing up from a chair using your arms (e.g., wheelchair or bedside chair)?: A Little ?Help needed to walk in hospital room?: A Little ?Help needed climbing 3-5 steps with a railing? : A Little ?6 Click Score: 20 ? ?  ?End of Session Equipment Utilized During Treatment: Gait belt ?Activity Tolerance: Patient limited by pain ?Patient left: in bed;with call bell/phone within reach;with bed alarm set;with nursing/sitter in room;with family/visitor present;with SCD's reapplied ?Nurse Communication: Mobility status;Patient requests pain meds ?PT Visit Diagnosis: Pain;Difficulty in walking, not elsewhere classified (R26.2) ?Pain - Right/Left: Right ?Pain - part of body: Knee ?  ? ? ?Time:  -  ?  ? ?Charges:  $Gait Training: 8-22 mins ?$Therapeutic Exercise: 8-22 mins          ?          ? ?Gwynneth Albright PT, DPT ?Acute Rehabilitation Services ?Office [phone] ?Pager [phone]  ? ? ?Jacques Navy ?01/02/2022, 10:50 AM ? ?

## 2022-01-02 NOTE — Progress Notes (Signed)
Pt is ready for discharge.  PIV removed.  Remains alert and oriented.  Pt's wife is at bedside.  AVS given.  Pt able to verbalize understanding of all discharge instructions, including wound care.  All questions answered. ?

## 2022-01-03 ENCOUNTER — Ambulatory Visit: Payer: MEDICARE | Admitting: Physician Assistant

## 2022-01-03 NOTE — Discharge Summary (Signed)
Patient ID: ?Beth Macdonald ?MRN: [id number] ?DOB/AGE: 78-May-1947 78 y.o. ? ?Admit date: 01/01/2022 ?Discharge date: 01/02/2022 ? ?Admission Diagnoses:  ?Principal Problem: ?  OA (osteoarthritis) of knee ?Active Problems: ?  Osteoarthritis of right knee ? ? ?Discharge Diagnoses:  ?Same ? ?Past Medical History:  ?Diagnosis Date  ? Aortic atherosclerosis ([place])   ? Diabetes mellitus without complication ([place])   ? Type 2  ? Diabetic neuropathy ([place])   ? DVT of proximal lower limb ([place])   ? Facial paralysis/Bells palsy   ? 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 Hyperlipidemia   ? Mitral valve insufficiency   ? Osteoarthritis   ? SCCA (squamous cell carcinoma) of skin 03/22/2021  ? Bridge of nose (in situ)  ? Skull fracture ([place])   ? age 3  ? Squamous cell carcinoma of skin 03/22/2021  ? Right forehead (in situ)  ? TIA (transient ischemic attack) 2020  ? Varicose vein of leg   ? ? ?Surgeries: Procedure(s): ?TOTAL KNEE ARTHROPLASTY on 01/01/2022 ?  ?Consultants:  ? ?Discharged Condition: Improved ? ?Hospital Course: Beth Macdonald is an 76 y.o. female who was admitted 01/01/2022 for operative treatment ofOA (osteoarthritis) of knee. Patient has severe unremitting pain that affects sleep, daily activities, and work/hobbies. After pre-op clearance the patient was taken to the operating room on 01/01/2022 and underwent  Procedure(s): ?TOTAL KNEE ARTHROPLASTY.   ? ?Patient was given perioperative antibiotics:  ?Anti-infectives (From admission, onward)  ? ? Start     Dose/Rate Route Frequency Ordered Stop  ? 01/01/22 1800  ceFAZolin (ANCEF) IVPB 2g/100 mL premix       ? 2 g ?200 mL/hr over 30 Minutes Intravenous Every 6 hours 01/01/22 1608 01/01/22 2353  ? 01/01/22 1015  ceFAZolin (ANCEF) IVPB 2g/100 mL premix       ? 2 g ?200 mL/hr over 30 Minutes Intravenous On call to O.R. 01/01/22 1005 01/01/22 1329  ? ?  ?  ? ?Patient was given sequential compression devices, early ambulation, and chemoprophylaxis to prevent  DVT. ? ?Patient benefited maximally from hospital stay and there were no complications.   ? ?Recent vital signs: Patient Vitals for the past 24 hrs: ? BP Temp Temp src Pulse Resp SpO2  ?01/02/22 1321 (!) 133/52 97.7 ?F (36.5 ?C) Oral 65 20 94 %  ?01/02/22 0915 (!) 151/68 97.6 ?F (36.4 ?C) Oral 67 20 92 %  ?  ? ?Recent laboratory studies:  ?Recent Labs  ?  01/02/22 ?3664  ?WBC 15.4*  ?HGB 12.8  ?HCT 36.8  ?PLT 231  ?NA 138  ?K 3.9  ?CL 103  ?CO2 24  ?BUN 23  ?CREATININE 0.96  ?GLUCOSE 170*  ?CALCIUM 8.5*  ? ? ? ?Discharge Medications:   ?Allergies as of 01/02/2022   ? ?   Reactions  ? Clindamycin/lincomycin   ? Cannot tolerate mycins causes C-Diff  ? Tetracyclines & Related Other (See Comments)  ? "Feel bad"  ? ?  ? ?  ?Medication List  ?  ? ?STOP taking these medications   ? ?cholecalciferol 25 MCG (1000 UNIT) tablet ?Commonly known as: VITAMIN D3 ?  ?ibuprofen 800 MG tablet ?Commonly known as: ADVIL ?  ?MAGNESIUM PO ?  ?OMEGA-3 FATTY ACIDS PO ?  ? ?  ? ?TAKE these medications   ? ?clobetasol cream 0.05 % ?Commonly known as: TEMOVATE ?Apply 1 application topically 2 (two) times daily. ?What changed:  ?when to take this ?reasons to take this ?  ?cycloSPORINE 0.05 %  ophthalmic emulsion ?Commonly known as: RESTASIS ?Place 1 drop into both eyes 2 (two) times daily. ?  ?desonide 0.05 % cream ?Commonly known as: DESOWEN ?Apply topically 2 (two) times daily as needed (Rash). ?  ?HYDROmorphone 2 MG tablet ?Commonly known as: DILAUDID ?Take 1-2 tablets (2-4 mg total) by mouth every 6 (six) hours as needed for severe pain. ?  ?lisinopril 5 MG tablet ?Commonly known as: ZESTRIL ?Take 5 mg by mouth daily. ?  ?metFORMIN 500 MG tablet ?Commonly known as: GLUCOPHAGE ?Take 1,000 mg by mouth 2 (two) times daily with a meal. ?  ?methocarbamol 500 MG tablet ?Commonly known as: ROBAXIN ?Take 1 tablet (500 mg total) by mouth every 6 (six) hours as needed for muscle spasms. ?  ?ondansetron 4 MG tablet ?Commonly known as: ZOFRAN ?Take 1  tablet (4 mg total) by mouth every 6 (six) hours as needed for nausea. ?  ?oxyCODONE 5 MG immediate release tablet ?Commonly known as: Oxy IR/ROXICODONE ?Take 1-2 tablets (5-10 mg total) by mouth every 6 (six) hours as needed for severe pain. ?  ?Pfizer COVID-19 Vac Bivalent injection ?Generic drug: COVID-19 mRNA bivalent vaccine [employer]) ?Inject into the muscle. ?  ?Repatha SureClick 845 MG/ML Soaj ?Generic drug: Evolocumab ?Inject 140 mg into the skin every 14 (fourteen) days. ?  ?rivaroxaban 10 MG Tabs tablet ?Commonly known as: XARELTO ?Take 1 tablet (10 mg total) by mouth daily with breakfast for 20 days. ?  ?Rybelsus 7 MG Tabs ?Generic drug: Semaglutide ?Take 7 mg by mouth daily. ?  ?[place] 4 % Crea ?Generic drug: Fluorouracil ?Apply 1 application. topically daily as needed (cancer recurrance to the nose). ?  ?traMADol 50 MG tablet ?Commonly known as: ULTRAM ?Take 1-2 tablets (50-100 mg total) by mouth every 6 (six) hours as needed for moderate pain. ?  ?TUMS PO ?Take 1 tablet by mouth daily as needed (acid control). ?  ? ?  ? ?  ?  ? ? ?  ?Discharge Care Instructions  ?(From admission, onward)  ?  ? ? ?  ? ?  Start     Ordered  ? 01/02/22 0000  Weight bearing as tolerated       ? 01/02/22 0819  ? 01/02/22 0000  Change dressing       ?Comments: You may remove the bulky bandage (ACE wrap and gauze) two days after surgery. You will have an adhesive waterproof bandage underneath. Leave this in place until your first follow-up appointment.  ? 01/02/22 0819  ? ?  ?  ? ?  ? ? ?Diagnostic Studies: No results found. ? ?Disposition: Discharge disposition: 01-Home or Self Care ? ? ? ? ? ? ?Discharge Instructions   ? ? Call MD / Call 911   Complete by: As directed ?  ? If you experience chest pain or shortness of breath, CALL 911 and be transported to the hospital emergency room.  If you develope a fever above 101 F, pus (white drainage) or increased drainage or redness at the wound, or calf pain, call your surgeon's  office.  ? Change dressing   Complete by: As directed ?  ? You may remove the bulky bandage (ACE wrap and gauze) two days after surgery. You will have an adhesive waterproof bandage underneath. Leave this in place until your first follow-up appointment.  ? Constipation Prevention   Complete by: As directed ?  ? Drink plenty of fluids.  Prune juice may be helpful.  You may use a stool softener, such  as Colace (over the counter) 100 mg twice a day.  Use MiraLax (over the counter) for constipation as needed.  ? Diet - low sodium heart healthy   Complete by: As directed ?  ? Do not put a pillow under the knee. Place it under the heel.   Complete by: As directed ?  ? Driving restrictions   Complete by: As directed ?  ? No driving for two weeks  ? Post-operative opioid taper instructions:   Complete by: As directed ?  ? POST-OPERATIVE OPIOID TAPER INSTRUCTIONS: ?It is important to wean off of your opioid medication as soon as possible. If you do not need pain medication after your surgery it is ok to stop day one. ?Opioids include: ?Codeine, Hydrocodone(Norco, Vicodin), Oxycodone(Percocet, oxycontin) and hydromorphone amongst others.  ?Long term and even short term use of opiods can cause: ?Increased pain response ?Dependence ?Constipation ?Depression ?Respiratory depression ?And more.  ?Withdrawal symptoms can include ?Flu like symptoms ?Nausea, vomiting ?And more ?Techniques to manage these symptoms ?Hydrate well ?Eat regular healthy meals ?Stay active ?Use relaxation techniques(deep breathing, meditating, yoga) ?Do Not substitute Alcohol to help with tapering ?If you have been on opioids for less than two weeks and do not have pain than it is ok to stop all together.  ?Plan to wean off of opioids ?This plan should start within one week post op of your joint replacement. ?Maintain the same interval or time between taking each dose and first decrease the dose.  ?Cut the total daily intake of opioids by one tablet each  day ?Next start to increase the time between doses. ?The last dose that should be eliminated is the evening dose.  ? ?  ? TED hose   Complete by: As directed ?  ? Use stockings (TED hose) for three weeks on both leg(s).

## 2022-01-04 DIAGNOSIS — M25561 Pain in right knee: Secondary | ICD-10-CM | POA: Diagnosis not present

## 2022-01-04 DIAGNOSIS — M1711 Unilateral primary osteoarthritis, right knee: Secondary | ICD-10-CM | POA: Diagnosis not present

## 2022-01-08 DIAGNOSIS — M25561 Pain in right knee: Secondary | ICD-10-CM | POA: Diagnosis not present

## 2022-01-08 DIAGNOSIS — M1711 Unilateral primary osteoarthritis, right knee: Secondary | ICD-10-CM | POA: Diagnosis not present

## 2022-01-10 DIAGNOSIS — M1711 Unilateral primary osteoarthritis, right knee: Secondary | ICD-10-CM | POA: Diagnosis not present

## 2022-01-10 DIAGNOSIS — M25561 Pain in right knee: Secondary | ICD-10-CM | POA: Diagnosis not present

## 2022-01-12 DIAGNOSIS — M1711 Unilateral primary osteoarthritis, right knee: Secondary | ICD-10-CM | POA: Diagnosis not present

## 2022-01-12 DIAGNOSIS — M25561 Pain in right knee: Secondary | ICD-10-CM | POA: Diagnosis not present

## 2022-01-15 DIAGNOSIS — M25561 Pain in right knee: Secondary | ICD-10-CM | POA: Diagnosis not present

## 2022-01-15 DIAGNOSIS — M1711 Unilateral primary osteoarthritis, right knee: Secondary | ICD-10-CM | POA: Diagnosis not present

## 2022-01-17 DIAGNOSIS — M1711 Unilateral primary osteoarthritis, right knee: Secondary | ICD-10-CM | POA: Diagnosis not present

## 2022-01-17 DIAGNOSIS — M25561 Pain in right knee: Secondary | ICD-10-CM | POA: Diagnosis not present

## 2022-01-19 DIAGNOSIS — M25561 Pain in right knee: Secondary | ICD-10-CM | POA: Diagnosis not present

## 2022-01-19 DIAGNOSIS — M1711 Unilateral primary osteoarthritis, right knee: Secondary | ICD-10-CM | POA: Diagnosis not present

## 2022-01-22 DIAGNOSIS — M1711 Unilateral primary osteoarthritis, right knee: Secondary | ICD-10-CM | POA: Diagnosis not present

## 2022-01-22 DIAGNOSIS — M25561 Pain in right knee: Secondary | ICD-10-CM | POA: Diagnosis not present

## 2022-01-24 ENCOUNTER — Ambulatory Visit: Payer: MEDICARE | Admitting: Physician Assistant

## 2022-01-24 DIAGNOSIS — M25561 Pain in right knee: Secondary | ICD-10-CM | POA: Diagnosis not present

## 2022-01-24 DIAGNOSIS — M1711 Unilateral primary osteoarthritis, right knee: Secondary | ICD-10-CM | POA: Diagnosis not present

## 2022-01-26 DIAGNOSIS — M1711 Unilateral primary osteoarthritis, right knee: Secondary | ICD-10-CM | POA: Diagnosis not present

## 2022-01-26 DIAGNOSIS — M25561 Pain in right knee: Secondary | ICD-10-CM | POA: Diagnosis not present

## 2022-01-31 DIAGNOSIS — M1711 Unilateral primary osteoarthritis, right knee: Secondary | ICD-10-CM | POA: Diagnosis not present

## 2022-01-31 DIAGNOSIS — M25561 Pain in right knee: Secondary | ICD-10-CM | POA: Diagnosis not present

## 2022-02-02 DIAGNOSIS — M25561 Pain in right knee: Secondary | ICD-10-CM | POA: Diagnosis not present

## 2022-02-02 DIAGNOSIS — M1711 Unilateral primary osteoarthritis, right knee: Secondary | ICD-10-CM | POA: Diagnosis not present

## 2022-02-05 DIAGNOSIS — M1711 Unilateral primary osteoarthritis, right knee: Secondary | ICD-10-CM | POA: Diagnosis not present

## 2022-02-05 DIAGNOSIS — M25561 Pain in right knee: Secondary | ICD-10-CM | POA: Diagnosis not present

## 2022-02-06 DIAGNOSIS — Z5189 Encounter for other specified aftercare: Secondary | ICD-10-CM | POA: Diagnosis not present

## 2022-02-07 DIAGNOSIS — M25561 Pain in right knee: Secondary | ICD-10-CM | POA: Diagnosis not present

## 2022-02-07 DIAGNOSIS — M1711 Unilateral primary osteoarthritis, right knee: Secondary | ICD-10-CM | POA: Diagnosis not present

## 2022-02-09 DIAGNOSIS — M1711 Unilateral primary osteoarthritis, right knee: Secondary | ICD-10-CM | POA: Diagnosis not present

## 2022-02-09 DIAGNOSIS — M25561 Pain in right knee: Secondary | ICD-10-CM | POA: Diagnosis not present

## 2022-02-12 DIAGNOSIS — M25561 Pain in right knee: Secondary | ICD-10-CM | POA: Diagnosis not present

## 2022-02-12 DIAGNOSIS — M1711 Unilateral primary osteoarthritis, right knee: Secondary | ICD-10-CM | POA: Diagnosis not present

## 2022-02-14 DIAGNOSIS — M1711 Unilateral primary osteoarthritis, right knee: Secondary | ICD-10-CM | POA: Diagnosis not present

## 2022-02-14 DIAGNOSIS — M25561 Pain in right knee: Secondary | ICD-10-CM | POA: Diagnosis not present

## 2022-02-16 DIAGNOSIS — M1711 Unilateral primary osteoarthritis, right knee: Secondary | ICD-10-CM | POA: Diagnosis not present

## 2022-02-16 DIAGNOSIS — M25561 Pain in right knee: Secondary | ICD-10-CM | POA: Diagnosis not present

## 2022-02-19 DIAGNOSIS — M1711 Unilateral primary osteoarthritis, right knee: Secondary | ICD-10-CM | POA: Diagnosis not present

## 2022-02-19 DIAGNOSIS — M25561 Pain in right knee: Secondary | ICD-10-CM | POA: Diagnosis not present

## 2022-02-21 DIAGNOSIS — M25561 Pain in right knee: Secondary | ICD-10-CM | POA: Diagnosis not present

## 2022-02-21 DIAGNOSIS — M1711 Unilateral primary osteoarthritis, right knee: Secondary | ICD-10-CM | POA: Diagnosis not present

## 2022-02-23 DIAGNOSIS — M1711 Unilateral primary osteoarthritis, right knee: Secondary | ICD-10-CM | POA: Diagnosis not present

## 2022-02-23 DIAGNOSIS — M25561 Pain in right knee: Secondary | ICD-10-CM | POA: Diagnosis not present

## 2022-02-26 DIAGNOSIS — M25561 Pain in right knee: Secondary | ICD-10-CM | POA: Diagnosis not present

## 2022-02-26 DIAGNOSIS — M1711 Unilateral primary osteoarthritis, right knee: Secondary | ICD-10-CM | POA: Diagnosis not present

## 2022-02-28 DIAGNOSIS — M25561 Pain in right knee: Secondary | ICD-10-CM | POA: Diagnosis not present

## 2022-02-28 DIAGNOSIS — M1711 Unilateral primary osteoarthritis, right knee: Secondary | ICD-10-CM | POA: Diagnosis not present

## 2022-03-08 DIAGNOSIS — M1711 Unilateral primary osteoarthritis, right knee: Secondary | ICD-10-CM | POA: Diagnosis not present

## 2022-03-08 DIAGNOSIS — M25561 Pain in right knee: Secondary | ICD-10-CM | POA: Diagnosis not present

## 2022-03-12 DIAGNOSIS — M25561 Pain in right knee: Secondary | ICD-10-CM | POA: Diagnosis not present

## 2022-03-12 DIAGNOSIS — M1711 Unilateral primary osteoarthritis, right knee: Secondary | ICD-10-CM | POA: Diagnosis not present

## 2022-03-14 DIAGNOSIS — M25561 Pain in right knee: Secondary | ICD-10-CM | POA: Diagnosis not present

## 2022-03-14 DIAGNOSIS — M1711 Unilateral primary osteoarthritis, right knee: Secondary | ICD-10-CM | POA: Diagnosis not present

## 2022-03-19 DIAGNOSIS — M1711 Unilateral primary osteoarthritis, right knee: Secondary | ICD-10-CM | POA: Diagnosis not present

## 2022-03-19 DIAGNOSIS — M25561 Pain in right knee: Secondary | ICD-10-CM | POA: Diagnosis not present

## 2022-03-21 DIAGNOSIS — E782 Mixed hyperlipidemia: Secondary | ICD-10-CM | POA: Diagnosis not present

## 2022-03-21 DIAGNOSIS — I10 Essential (primary) hypertension: Secondary | ICD-10-CM | POA: Diagnosis not present

## 2022-03-21 DIAGNOSIS — E1169 Type 2 diabetes mellitus with other specified complication: Secondary | ICD-10-CM | POA: Diagnosis not present

## 2022-03-21 DIAGNOSIS — Z8673 Personal history of transient ischemic attack (TIA), and cerebral infarction without residual deficits: Secondary | ICD-10-CM | POA: Diagnosis not present

## 2022-03-22 DIAGNOSIS — M1711 Unilateral primary osteoarthritis, right knee: Secondary | ICD-10-CM | POA: Diagnosis not present

## 2022-03-22 DIAGNOSIS — M25561 Pain in right knee: Secondary | ICD-10-CM | POA: Diagnosis not present

## 2022-03-26 DIAGNOSIS — M1711 Unilateral primary osteoarthritis, right knee: Secondary | ICD-10-CM | POA: Diagnosis not present

## 2022-03-26 DIAGNOSIS — M25561 Pain in right knee: Secondary | ICD-10-CM | POA: Diagnosis not present

## 2022-03-29 DIAGNOSIS — M25561 Pain in right knee: Secondary | ICD-10-CM | POA: Diagnosis not present

## 2022-03-29 DIAGNOSIS — M1711 Unilateral primary osteoarthritis, right knee: Secondary | ICD-10-CM | POA: Diagnosis not present

## 2022-04-02 DIAGNOSIS — M1711 Unilateral primary osteoarthritis, right knee: Secondary | ICD-10-CM | POA: Diagnosis not present

## 2022-04-02 DIAGNOSIS — M25561 Pain in right knee: Secondary | ICD-10-CM | POA: Diagnosis not present

## 2022-04-05 DIAGNOSIS — M25561 Pain in right knee: Secondary | ICD-10-CM | POA: Diagnosis not present

## 2022-04-05 DIAGNOSIS — M1711 Unilateral primary osteoarthritis, right knee: Secondary | ICD-10-CM | POA: Diagnosis not present

## 2022-04-09 DIAGNOSIS — M25561 Pain in right knee: Secondary | ICD-10-CM | POA: Diagnosis not present

## 2022-04-09 DIAGNOSIS — M1711 Unilateral primary osteoarthritis, right knee: Secondary | ICD-10-CM | POA: Diagnosis not present

## 2022-04-12 ENCOUNTER — Other Ambulatory Visit: Payer: MEDICARE

## 2022-04-12 DIAGNOSIS — M25561 Pain in right knee: Secondary | ICD-10-CM | POA: Diagnosis not present

## 2022-04-12 DIAGNOSIS — M1711 Unilateral primary osteoarthritis, right knee: Secondary | ICD-10-CM | POA: Diagnosis not present

## 2022-04-16 DIAGNOSIS — M25561 Pain in right knee: Secondary | ICD-10-CM | POA: Diagnosis not present

## 2022-04-16 DIAGNOSIS — M1711 Unilateral primary osteoarthritis, right knee: Secondary | ICD-10-CM | POA: Diagnosis not present

## 2022-04-18 ENCOUNTER — Telehealth: Payer: Self-pay | Admitting: *Deleted

## 2022-04-18 MED ORDER — CLOBETASOL PROP EMOLLIENT BASE 0.05 % EX CREA: 1.0000 | TOPICAL_CREAM | Freq: Two times a day (BID) | CUTANEOUS | 10 refills | Status: DC

## 2022-04-18 MED ORDER — TOLAK 4 % EX CREA: 1.0000 "application " | TOPICAL_CREAM | Freq: Every day | CUTANEOUS | 6 refills | Status: DC

## 2022-04-18 NOTE — Telephone Encounter (Signed)
Phone call from patient needing refills on her tolak and clobetasol. Ok per [employer].

## 2022-04-19 DIAGNOSIS — M25561 Pain in right knee: Secondary | ICD-10-CM | POA: Diagnosis not present

## 2022-04-19 DIAGNOSIS — M1711 Unilateral primary osteoarthritis, right knee: Secondary | ICD-10-CM | POA: Diagnosis not present

## 2022-04-23 ENCOUNTER — Other Ambulatory Visit: Payer: Self-pay | Admitting: Physician Assistant

## 2022-04-23 DIAGNOSIS — L239 Allergic contact dermatitis, unspecified cause: Secondary | ICD-10-CM

## 2022-04-23 MED ORDER — CLOBETASOL PROPIONATE 0.05 % EX CREA: 1.0000 | TOPICAL_CREAM | Freq: Two times a day (BID) | CUTANEOUS | 10 refills | Status: DC

## 2022-04-24 ENCOUNTER — Other Ambulatory Visit: Payer: Self-pay | Admitting: *Deleted

## 2022-04-24 DIAGNOSIS — L239 Allergic contact dermatitis, unspecified cause: Secondary | ICD-10-CM

## 2022-04-24 DIAGNOSIS — M25561 Pain in right knee: Secondary | ICD-10-CM | POA: Diagnosis not present

## 2022-04-24 DIAGNOSIS — M1711 Unilateral primary osteoarthritis, right knee: Secondary | ICD-10-CM | POA: Diagnosis not present

## 2022-04-24 MED ORDER — CLOBETASOL PROPIONATE 0.05 % EX CREA: 1.0000 | TOPICAL_CREAM | Freq: Two times a day (BID) | CUTANEOUS | 10 refills | Status: DC

## 2022-04-25 ENCOUNTER — Ambulatory Visit: Payer: MEDICARE | Admitting: Physician Assistant

## 2022-04-27 DIAGNOSIS — M25561 Pain in right knee: Secondary | ICD-10-CM | POA: Diagnosis not present

## 2022-04-27 DIAGNOSIS — M1711 Unilateral primary osteoarthritis, right knee: Secondary | ICD-10-CM | POA: Diagnosis not present

## 2022-04-30 DIAGNOSIS — M1711 Unilateral primary osteoarthritis, right knee: Secondary | ICD-10-CM | POA: Diagnosis not present

## 2022-04-30 DIAGNOSIS — M25561 Pain in right knee: Secondary | ICD-10-CM | POA: Diagnosis not present

## 2022-05-02 DIAGNOSIS — M8580 Other specified disorders of bone density and structure, unspecified site: Secondary | ICD-10-CM | POA: Diagnosis not present

## 2022-05-02 DIAGNOSIS — Z01419 Encounter for gynecological examination (general) (routine) without abnormal findings: Secondary | ICD-10-CM | POA: Diagnosis not present

## 2022-05-02 DIAGNOSIS — M1711 Unilateral primary osteoarthritis, right knee: Secondary | ICD-10-CM | POA: Diagnosis not present

## 2022-05-02 DIAGNOSIS — I10 Essential (primary) hypertension: Secondary | ICD-10-CM | POA: Diagnosis not present

## 2022-05-02 DIAGNOSIS — M25561 Pain in right knee: Secondary | ICD-10-CM | POA: Diagnosis not present

## 2022-05-02 DIAGNOSIS — Z780 Asymptomatic menopausal state: Secondary | ICD-10-CM | POA: Diagnosis not present

## 2022-06-01 DIAGNOSIS — Z23 Encounter for immunization: Secondary | ICD-10-CM | POA: Diagnosis not present

## 2022-06-04 ENCOUNTER — Observation Stay (HOSPITAL_COMMUNITY): Payer: MEDICARE

## 2022-06-04 ENCOUNTER — Observation Stay (HOSPITAL_COMMUNITY)
Admission: EM | Admit: 2022-06-04 | Discharge: 2022-06-05 | Disposition: A | Discharge status: Home Health | Payer: MEDICARE | Attending: Internal Medicine | Admitting: Internal Medicine

## 2022-06-04 ENCOUNTER — Emergency Department (HOSPITAL_COMMUNITY): Payer: MEDICARE

## 2022-06-04 ENCOUNTER — Encounter (HOSPITAL_COMMUNITY): Payer: Self-pay

## 2022-06-04 ENCOUNTER — Other Ambulatory Visit: Payer: Self-pay

## 2022-06-04 DIAGNOSIS — Z79899 Other long term (current) drug therapy: Secondary | ICD-10-CM | POA: Insufficient documentation

## 2022-06-04 DIAGNOSIS — R42 Dizziness and giddiness: Secondary | ICD-10-CM | POA: Diagnosis not present

## 2022-06-04 DIAGNOSIS — E1169 Type 2 diabetes mellitus with other specified complication: Secondary | ICD-10-CM | POA: Diagnosis present

## 2022-06-04 DIAGNOSIS — Z86718 Personal history of other venous thrombosis and embolism: Secondary | ICD-10-CM | POA: Diagnosis not present

## 2022-06-04 DIAGNOSIS — Z85828 Personal history of other malignant neoplasm of skin: Secondary | ICD-10-CM | POA: Diagnosis not present

## 2022-06-04 DIAGNOSIS — Z7985 Long-term (current) use of injectable non-insulin antidiabetic drugs: Secondary | ICD-10-CM | POA: Diagnosis not present

## 2022-06-04 DIAGNOSIS — Z7984 Long term (current) use of oral hypoglycemic drugs: Secondary | ICD-10-CM | POA: Insufficient documentation

## 2022-06-04 DIAGNOSIS — I639 Cerebral infarction, unspecified: Secondary | ICD-10-CM | POA: Diagnosis not present

## 2022-06-04 DIAGNOSIS — Z96651 Presence of right artificial knee joint: Secondary | ICD-10-CM | POA: Insufficient documentation

## 2022-06-04 DIAGNOSIS — M179 Osteoarthritis of knee, unspecified: Secondary | ICD-10-CM | POA: Diagnosis present

## 2022-06-04 DIAGNOSIS — I10 Essential (primary) hypertension: Secondary | ICD-10-CM

## 2022-06-04 DIAGNOSIS — I63233 Cerebral infarction due to unspecified occlusion or stenosis of bilateral carotid arteries: Secondary | ICD-10-CM | POA: Diagnosis not present

## 2022-06-04 DIAGNOSIS — E785 Hyperlipidemia, unspecified: Secondary | ICD-10-CM | POA: Diagnosis present

## 2022-06-04 DIAGNOSIS — Z87891 Personal history of nicotine dependence: Secondary | ICD-10-CM | POA: Insufficient documentation

## 2022-06-04 DIAGNOSIS — R4781 Slurred speech: Secondary | ICD-10-CM | POA: Diagnosis not present

## 2022-06-04 DIAGNOSIS — R2990 Unspecified symptoms and signs involving the nervous system: Secondary | ICD-10-CM

## 2022-06-04 DIAGNOSIS — I6320 Cerebral infarction due to unspecified occlusion or stenosis of unspecified precerebral arteries: Principal | ICD-10-CM | POA: Insufficient documentation

## 2022-06-04 DIAGNOSIS — I63332 Cerebral infarction due to thrombosis of left posterior cerebral artery: Secondary | ICD-10-CM | POA: Diagnosis not present

## 2022-06-04 DIAGNOSIS — R471 Dysarthria and anarthria: Secondary | ICD-10-CM

## 2022-06-04 DIAGNOSIS — E1140 Type 2 diabetes mellitus with diabetic neuropathy, unspecified: Secondary | ICD-10-CM | POA: Insufficient documentation

## 2022-06-04 DIAGNOSIS — G459 Transient cerebral ischemic attack, unspecified: Secondary | ICD-10-CM | POA: Diagnosis present

## 2022-06-04 LAB — CBC WITH DIFFERENTIAL/PLATELET
Abs Immature Granulocytes: 0.02 10*3/uL (ref 0.00–0.07)
Basophils Absolute: 0.1 10*3/uL (ref 0.0–0.1)
Basophils Relative: 1 %
Eosinophils Absolute: 0.2 10*3/uL (ref 0.0–0.5)
Eosinophils Relative: 2 %
HCT: 38.7 % (ref 36.0–46.0)
Hemoglobin: 12.7 g/dL (ref 12.0–15.0)
Immature Granulocytes: 0 %
Lymphocytes Relative: 25 %
Lymphs Abs: 1.8 10*3/uL (ref 0.7–4.0)
MCH: 30.6 pg (ref 26.0–34.0)
MCHC: 32.8 g/dL (ref 30.0–36.0)
MCV: 93.3 fL (ref 80.0–100.0)
Monocytes Absolute: 0.7 10*3/uL (ref 0.1–1.0)
Monocytes Relative: 9 %
Neutro Abs: 4.5 10*3/uL (ref 1.7–7.7)
Neutrophils Relative %: 63 %
Platelets: 248 10*3/uL (ref 150–400)
RBC: 4.15 MIL/uL (ref 3.87–5.11)
RDW: 13.5 % (ref 11.5–15.5)
WBC: 7.2 10*3/uL (ref 4.0–10.5)
nRBC: 0 % (ref 0.0–0.2)

## 2022-06-04 LAB — BASIC METABOLIC PANEL
Anion gap: 9 (ref 5–15)
BUN: 26 mg/dL — ABNORMAL HIGH (ref 8–23)
CO2: 25 mmol/L (ref 22–32)
Calcium: 9.7 mg/dL (ref 8.9–10.3)
Chloride: 106 mmol/L (ref 98–111)
Creatinine, Ser: 1.12 mg/dL — ABNORMAL HIGH (ref 0.44–1.00)
GFR, Estimated: 50 mL/min — ABNORMAL LOW (ref >60)
Glucose, Bld: 106 mg/dL — ABNORMAL HIGH (ref 70–99)
Potassium: 4.2 mmol/L (ref 3.5–5.1)
Sodium: 140 mmol/L (ref 135–145)

## 2022-06-04 LAB — TSH: TSH: 2.406 u[IU]/mL (ref 0.350–4.500)

## 2022-06-04 LAB — HEMOGLOBIN A1C
Hgb A1c MFr Bld: 6 % — ABNORMAL HIGH (ref 4.8–5.6)
Mean Plasma Glucose: 125.5 mg/dL

## 2022-06-04 LAB — PROTIME-INR
INR: 0.9 (ref 0.8–1.2)
Prothrombin Time: 12.3 seconds (ref 11.4–15.2)

## 2022-06-04 MED ORDER — IOHEXOL 350 MG/ML SOLN
75.0000 mL | Freq: Once | INTRAVENOUS | Status: AC | PRN
  Administered 2022-06-04: 75 mL via INTRAVENOUS

## 2022-06-04 MED ORDER — IBUPROFEN 200 MG PO TABS: 800.0000 mg | ORAL_TABLET | Freq: Four times a day (QID) | ORAL | Status: DC | PRN

## 2022-06-04 MED ORDER — ASPIRIN 325 MG PO TABS
325.0000 mg | ORAL_TABLET | Freq: Every day | ORAL | Status: DC
  Administered 2022-06-04 – 2022-06-05 (×2): 325 mg via ORAL
  Filled 2022-06-04 (×2): qty 1

## 2022-06-04 MED ORDER — INSULIN ASPART 100 UNIT/ML IJ SOLN: 0.0000 [IU] | Freq: Three times a day (TID) | INTRAMUSCULAR | Status: DC

## 2022-06-04 MED ORDER — ENOXAPARIN SODIUM 40 MG/0.4ML IJ SOSY
40.0000 mg | PREFILLED_SYRINGE | INTRAMUSCULAR | Status: DC
  Administered 2022-06-04: 40 mg via SUBCUTANEOUS
  Filled 2022-06-04: qty 0.4

## 2022-06-04 MED ORDER — ASPIRIN 81 MG PO TBEC: 81.0000 mg | DELAYED_RELEASE_TABLET | Freq: Every day | ORAL | Status: DC

## 2022-06-04 MED ORDER — POLYETHYLENE GLYCOL 3350 17 G PO PACK: 17.0000 g | PACK | Freq: Every day | ORAL | Status: DC | PRN

## 2022-06-04 MED ORDER — ACETAMINOPHEN 325 MG PO TABS: 650.0000 mg | ORAL_TABLET | Freq: Four times a day (QID) | ORAL | Status: DC | PRN

## 2022-06-04 MED ORDER — ACETAMINOPHEN 650 MG RE SUPP: 650.0000 mg | Freq: Four times a day (QID) | RECTAL | Status: DC | PRN

## 2022-06-04 NOTE — Hospital Course (Addendum)
-  Yesterday Am, had breakfast, went to room => become dizzy, room spinning. Try to reposition but did work. Speech became more slurred. No issue finding word. Sx persist -TIA in 2020, similar sx, lasted 20 min -Used walker after knee replacement 2021, otherwise would fall -No weakness, just dizzy. Fell once last night, no head injury.   -Vertigo has improved.   -Has vertigo in the past but no like this. Usually can abort sx by doing vestibular exercise   -Hx of Bells palsy. No new facial droop.   No fever, chill, Nausea, recent illness, headache, arm weakness, vision change, swallow dif,   Social hx -live w her spouse -Was a Oncologist. Teach water aerobic -PCP: Kathlen Mody -Used to smoke, quit 8 yr ago, 1p/d. -Social alcohol -No substances  Fam -no stroke, HTN or DM  ? Vertigo  Meds -Oxy 1 week - stopped -Tramadol w codein - off  -lisinopril -Repatha, last shot last Sunday. -Metformin -rybelsus -Vit D -Statin stopped bc doing well on Repatha  10/3 Dizziness is resolving. Speech still seems a little bit off. Thinks her blood pressure is still high.

## 2022-06-04 NOTE — ED Provider Triage Note (Signed)
Emergency Medicine Provider Triage Evaluation Note  Beth Macdonald , a 76 y.o. female  was evaluated in triage.  Pt complains of slurred speech and dizziness, started yesterday, started at 930, states that she woke up and felt dizzy described as being lightheaded, she also notes that she was slurring her words, she states that this has remained unchanged from yesterday, she has history of TIAs, as well as vertigo she is unclear which is which.  Not on anticoag no recent head trauma no other complaints..  Review of Systems  Positive: Slurred speech, dizziness Negative: Headaches change in vision  Physical Exam  BP (!) 187/92 (BP Location: Right Arm)   Pulse 79   Temp 98.2 F (36.8 C) (Oral)   Resp 18   Ht '5\' 8"'$  (1.727 m)   Wt 79.4 kg   SpO2 95%   BMI 26.61 kg/m  Gen:   Awake, no distress   Resp:  Normal effort  MSK:   Moves extremities without difficulty  Other:  Right-sided facial droop secondary due to Bell's palsy, she has noted decreased grip strength as well as arm strength in the left arm 4-5 strength versus 5 out of 5 in the right, no decrease in strength in the lower extremities, sensation fully intact unable to stand due to feeling off balance.  Medical Decision Making  Medically screening exam initiated at 12:07 PM.  Appropriate orders placed.  Beth Macdonald was informed that the remainder of the evaluation will be completed by another provider, this initial triage assessment does not replace that evaluation, and the importance of remaining in the ED until their evaluation is complete.  Patient is outside stroke window, will obtain stroke work-up patient will need further work-up.   Marcello Fennel, PA-C 06/04/22 1209

## 2022-06-04 NOTE — H&P (Addendum)
Date: 06/04/2022               Patient Name:  Beth Macdonald MRN: [id number]  DOB: 01-Aug-1946 Age / Sex: 76 y.o., female   PCP: [name], MD         Medical Service: Internal Medicine Teaching Service         Attending Physician: Dr. Lucious Groves, DO    First Contact: Roswell Nickel, MD      Pager: [phone]      Second Contact: Lorine Bears      Pager: Liliane Shi [phone]           After Hours (After 5p/  First Contact Pager: [phone]  weekends / holidays): Second Contact Pager: [phone]   SUBJECTIVE   Chief Complaint: Slurred speech   History of Present Illness: Beth Macdonald is a 76 year old female with a past medical history of TIA 10-2018, facial nerve palsy, vertigo, hypertension, and knee osteoarthritis who presents for slurred speech and dizziness.  Yesterday 10-1, the patient had breakfast per usual and then become dizzy after returning to her bedroom. She stated that the room spinning and it felt similar to an episode of vertigo. However, she tried the Epley maneuver and the dizziness persisted. In the past, her attempts to reposition are usually effective. Additionally, she noticed that her speech became more slurred. She denies word-finding difficulties, headache, weakness, visual changes, swallowing issues, fever, chills, nausea, and any recent illnesses.  She underwent a right TKA in 01-2022 and was given a walker at that time, which has been helping her ambulate since the dizziness episode. Last night, she fell backward on her buttocks, and denies head trauma or loss of consciousness. History is also significant for a TIA in 2020 with similar symptoms, but they resolved within 54mn.  Since arrival to the ED, she states that her dizziness has significantly improved. Her slurred speech, however, persists. 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 Her spouse, who was present at bedside, states that her facial droop is unchanged from  before the dizziness episode.  At home she takes lisinopril, evolocumab, metformin, semaglutide, and vitamin D. She was previously taking oxycodone and tramadol with codeine for post-operative pain, but stopped both of these medications recently.    ED Course: Upon arrival to the ED, vitals were notable for elevated BP of 187/92. Laboratory tests demonstrated BUN 26, Cr 1.12, and GFR 50. Head CT without evidence of acute pathology, only showing generalized parenchymal volume loss and chronic small-vessel ischemic changes.    Meds:  No outpatient medications have been marked as taking for the [facility]    Past Medical History  Past Surgical History:  Procedure Laterality Date   ELBOW SURGERY Bilateral    20 yrs ago   ENDOVENOUS ABLATION SAPHENOUS VEIN W/ LASER Bilateral    ENDOVENOUS ABLATION SAPHENOUS VEIN W/ LASER Left 11/19/2019   endovenous laser ablation anterior accessory branch of left GSV and stab phlebectomy> 20 incisions left leg by CGae GallopMD    shoulder Bilateral    [phone]  TOENAIL EXCISION Right 2019   Big toe   TONSILLECTOMY Bilateral    TOTAL KNEE ARTHROPLASTY Right 01/01/2022   Procedure: TOTAL KNEE ARTHROPLASTY;  Surgeon: AGaynelle Arabian MD;  Location: [facility];  Service: Orthopedics;  Laterality: Right;     Social:  Lives With: spouse Occupation: previously fOncologist now tBuyer, retailSupport: strong family support from spouse Level  of Function: independent in ADLs PCP: [name] Substances: previously smoked about 1ppd for 61yrand quit over 426yrgo, drinks alcohol socially, denies using any other drugs    Family History:  Denies family history of stroke, hypertension, or diabetes   Allergies: Allergies as of 06/04/2022 - Review Complete 06/04/2022  Allergen Reaction Noted   Clindamycin/lincomycin  04/14/2019   Tetracyclines & related Other (See Comments) 10/29/2018      Review of  Systems: A complete ROS was negative except as per HPI.    OBJECTIVE:   Physical Exam: Blood pressure (!) 192/82, pulse 71, temperature 98.2 F (36.8 C), temperature source Oral, resp. rate (!) 21, height '5\' 8"'$  (1.727 m), weight 79.4 kg, SpO2 100 %.   General:      awake and alert, lying comfortably in bed, cooperative, not in acute distress Skin:       warm and dry, intact without any obvious lesions or scars, no rashes Head:      normocephalic and atraumatic, oral mucosa moist with good dentition Eyes:      extraocular movements intact, slight nystagmus with rightward gaze, conjunctivae pink, pupils round and reactive to light, no periorbital swelling or scleral icterus Lungs:      normal respiratory effort, breathing unlabored, symmetrical chest rise, no crackles or wheezing Cardiac:      regular rate and rhythm, normal S1 and S2, no pitting edema Abdomen:      soft and non-distended Musculoskeletal:  motor strength 5 /5 in all four extremities except 5- /5 in hip flexion, well-healed vertical incision scar consistent with TKA on R knee Neurologic:      oriented to person-place-time, moving all extremities, sensation to light touch intact and symmetrical in all four extremities, CN II through XII intact, truncal instability Psychiatric:      mood and affect normal, intelligible speech   Labs: CBC    Component Value Date/Time   WBC 7.2 06/04/2022 1219   RBC 4.15 06/04/2022 1219   HGB 12.7 06/04/2022 1219   HCT 38.7 06/04/2022 1219   PLT 248 06/04/2022 1219   MCV 93.3 06/04/2022 1219   MCH 30.6 06/04/2022 1219   MCHC 32.8 06/04/2022 1219   RDW 13.5 06/04/2022 1219   LYMPHSABS 1.8 06/04/2022 1219   MONOABS 0.7 06/04/2022 1219   EOSABS 0.2 06/04/2022 1219   BASOSABS 0.1 06/04/2022 1219     CMP     Component Value Date/Time   NA 140 06/04/2022 1219   K 4.2 06/04/2022 1219   CL 106 06/04/2022 1219   CO2 25 06/04/2022 1219   GLUCOSE 106 (H) 06/04/2022 1219   BUN  26 (H) 06/04/2022 1219   CREATININE 1.12 (H) 06/04/2022 1219   CREATININE 1.04 12/25/2012 1609   CALCIUM 9.7 06/04/2022 1219   PROT 7.1 10/29/2018 1612   ALBUMIN 4.2 10/29/2018 1612   AST 21 10/29/2018 1612   ALT 21 10/29/2018 1612   ALKPHOS 42 10/29/2018 1612   BILITOT 0.5 10/29/2018 1612   GFRNONAA 50 (L) 06/04/2022 1219   GFRAA >60 10/30/2018 0743     Imaging: CT Head Wo Contrast  Result Date: 06/04/2022 CLINICAL DATA:  TIA. Patient complains of slurred speech and dizziness. Patient reports history of TIAs and vertigo. EXAM: CT HEAD WITHOUT CONTRAST TECHNIQUE: Contiguous axial images were obtained from the base of the skull through the vertex without intravenous contrast. RADIATION DOSE REDUCTION: This exam was performed according to the departmental dose-optimization program which includes automated exposure control, adjustment  of the mA and/or kV according to patient size and/or use of iterative reconstruction technique. COMPARISON:  CT head 10/29/2018 FINDINGS: Brain: No evidence of acute infarction, hemorrhage, hydrocephalus, extra-axial collection or mass lesion/mass effect. Generalized parenchymal volume loss. Scattered hypodensities in the subcortical and periventricular white matter, consistent with chronic small-vessel ischemic changes. Vascular: Vascular calcifications at the skull base. No hyperdense vessel or unexpected calcification. Skull: Normal. Negative for fracture or focal lesion. Sinuses/Orbits: No acute finding. Mucous retention cyst in the right maxillary sinus. Trace mucosal thickening in the left maxillary sinus. The other visualized paranasal sinuses and mastoid air cells are clear. Other: None. IMPRESSION: No acute intracranial abnormality. Generalized parenchymal volume loss and chronic small-vessel ischemic changes. Electronically Signed   By: Ileana Roup M.D.   On: 06/04/2022 13:46       ECG: My personal interpretation is normal sinus rhythm, which is unchanged  from prior ECG on 10-2    ASSESSMENT & PLAN:   Assessment & Plan by Problem: Principal Problem:   Stroke-like symptom Active Problems:   OA (osteoarthritis) of knee   Dyslipidemia   Type 2 diabetes mellitus with hyperlipidemia (HCC)   HTN (hypertension)   Niccole Witthuhn Weinmann is a 76 y.o. person living with a history of TIA 10-2018, facial nerve palsy, vertigo, hypertension, and knee osteoarthritis who presents for slurred speech and dizziness, now admitted for stroke work-up currently on hospital day 0   ---Stroke-like symptom ---Central vertigo ---History of transient ischemic attack 10-2018 ---History of benign paroxysmal positional vertigo ---History of facial nerve palsy Patient presents with central dizziness and slurred speech that began roughly 33hr ago, also accompanied by truncal instability. Upon arrival to the ED, blood pressure was elevated to 187/92. Head CT was negative for acute pathology. Her dizziness has improved somewhat since arrival to the ED, but slurred speech persists. After prior TIA in 10-2018, thirty-day cardiac monitoring failed to identify any episodes of atrial fibrillation. Etiology is most likely cerebrovascular accident, as the window for TIA has passed, possibly from hypertension. Further diagnostic workup with MRI is still pending. Patient has passed her bedside swallow study. > Consult neurology, appreciate recommendations > Aspirin '325mg'$  x1 then '81mg'$  q24 starting tomorrow 10-3  > Brain MRI without contrast > Head and neck CT angiography  > Echocardiogram with bubble study > Trend BMP and CBC > Check lipid panel > Check TSH level > Physical-occupational-speech therapy evaluation   ---Hypertension ---Dyslipidemia Patient reports a history of hypertension and dyslipidemia that have both been well controlled with medications. She takes lisinopril and evolocumab at home as prescribed. Last lipid panel only notable for elevated triglycerides to 168.  Upon arrival to the ED, her blood pressure was 187/92. Treatment team will hold her home lisinopril to permit hypertension and ensure parenchymal perfusion. > Hold lisinopril '5mg'$  q24   ---Diabetes II Patient has history of diabetes controlled with metformin and semaglutide. Last A1C collected 02-2021 was 7.2. > Trend CBGs > Insulin sliding scale, moderate sensitivity   ---Knee osteoarthritis post right total knee arthroplasty Patient underwent right TKA in 01-2022. During the post-operative period, she was taking oxycodone and tramadol for pain but has finished both courses. > Acetaminophen '650mg'$  q6 PRN > Ibuprofen '800mg'$  q6 PRN     Diet: Heart Healthy VTE: Enoxaparin IVF: None,None Code: DNR  Prior to Admission Living Arrangement: Home, living with spouse Anticipated Discharge Location:  TBD Barriers to Discharge: medical management and further diagnostic workup  Dispo: Admit patient to Observation with expected length  of stay less than 2 midnights.  Signed: Serita Butcher, MD Internal Medicine Resident PGY-1  06/04/2022, 6:01 PM

## 2022-06-04 NOTE — Progress Notes (Signed)
Patient requested assistance with a rolling walker at discharge.

## 2022-06-04 NOTE — ED Provider Notes (Signed)
[facility] EMERGENCY DEPARTMENT Provider Note   CSN: [id number] Arrival date & time: 06/04/22  1150     History  Chief Complaint  Patient presents with   Aphasia    Brecken Walth Kaczorowski is a 76 y.o. female.  HPI  Per the patient, the patient has had persistent dysarthria, intermittent episodes aphasia persistent vertigo since yesterday at approximately 9:30 AM.  Patient states that she has a history of vertigo however this dizziness felt different than her normal vertigo.  She does not know so severe that she was having difficulty walking.  Patient states that similar in timing to the onset of her vertigo, the patient developed difficulty with speech.  She was having significantly slurred speech that had not resolved.  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  She denies any focal weakness or sensory deficits in her extremities bilaterally.  Patient denies any recent head trauma, fever, chills, chest pain, shortness of breath.  Patient endorsed that she recently underwent a right sided knee replacement which was relatively uncomplicated.  Patient states that she has been able to ambulate without significant difficulty since the knee replacement she does use walker occasionally for support.    Home Medications Prior to Admission medications   Medication Sig Start Date End Date Taking? Authorizing Provider  Calcium Carbonate Antacid (TUMS PO) Take 1 tablet by mouth daily as needed (acid control).   Yes [provider]  clobetasol cream (TEMOVATE) 3.41 % Apply 1 Application topically 2 (two) times daily. 04/24/22  Yes Sheffield, Ronalee Red, PA-C  Clobetasol Prop Emollient Base (CLOBETASOL PROPIONATE E) 0.05 % emollient cream Apply 1 Application topically 2 (two) times daily. 04/18/22  Yes Sheffield, Kelli R, PA-C  cycloSPORINE (RESTASIS) 0.05 % ophthalmic emulsion Place 1 drop into both eyes 2 (two) times daily.    Yes [provider]  desonide (DESOWEN) 0.05 % cream Apply topically 2 (two) times daily as needed (Rash). 03/22/21  Yes Sheffield, Vida Roller R, PA-C  Fluorouracil (TOLAK) 4 % CREA Apply 1 application  topically at bedtime. X 2 weeks. 04/18/22  Yes Sheffield, Kelli R, PA-C  lisinopril (ZESTRIL) 5 MG tablet Take 5 mg by mouth daily. 12/30/20  Yes [provider]  metFORMIN (GLUCOPHAGE) 500 MG tablet Take 1,000 mg by mouth 2 (two) times daily with a meal. 12/14/18  Yes [provider]  ondansetron (ZOFRAN) 4 MG tablet Take 1 tablet (4 mg total) by mouth every 6 (six) hours as needed for nausea. 01/02/22  Yes Edmisten, Kristie L, PA  Polyethyl Glycol-Propyl Glycol (SYSTANE FREE OP) Apply 1 drop to eye daily as needed (dryness/irritation).   Yes [provider]  REPATHA SURECLICK 962 MG/ML SOAJ Inject 140 mg into the skin every 14 (fourteen) days. Sunday 10/09/21  Yes [provider]  Semaglutide (RYBELSUS) 7 MG TABS Take 7 mg by mouth daily. 06/07/21  Yes [provider]  HYDROmorphone (DILAUDID) 2 MG tablet Take 1-2 tablets (2-4 mg total) by mouth every 6 (six) hours as needed for severe pain. Patient not taking: Reported on 06/04/2022 01/02/22   Edmisten, Ok Anis, PA  methocarbamol (ROBAXIN) 500 MG tablet Take 1 tablet (500 mg total) by mouth every 6 (six) hours as needed for muscle spasms. Patient not taking: Reported on 06/04/2022 01/02/22   Edmisten, Drue Dun L, PA  oxyCODONE (OXY IR/ROXICODONE) 5 MG immediate release tablet Take 1-2 tablets (5-10 mg total) by mouth every 6 (six)  hours as needed for severe pain. Patient not taking: Reported on 06/04/2022 01/02/22   Edmisten, Ok Anis, PA  traMADol (ULTRAM) 50 MG tablet Take 1-2 tablets (50-100 mg total) by mouth every 6 (six) hours as needed for moderate pain. Patient not taking: Reported on 06/04/2022 01/02/22   Derl Barrow, PA      Allergies    Clindamycin/lincomycin, Eggs or egg-derived products,  Milk-related compounds, and Tetracyclines & related    Review of Systems   Review of Systems  Physical Exam Updated Vital Signs BP (!) 176/66 (BP Location: Left Arm)   Pulse 75   Temp 97.9 F (36.6 C) (Oral)   Resp 16   Ht '5\' 8"'$  (1.727 m)   Wt 79.4 kg   SpO2 96%   BMI 26.61 kg/m  Physical Exam Vitals and nursing note reviewed.  Constitutional:      General: She is not in acute distress.    Appearance: She is well-developed.  HENT:     Head: Normocephalic and atraumatic.  Eyes:     Extraocular Movements: Extraocular movements intact.     Conjunctiva/sclera: Conjunctivae normal.     Pupils: Pupils are equal, round, and reactive to light.  Cardiovascular:     Rate and Rhythm: Normal rate and regular rhythm.     Heart sounds: No murmur heard. Pulmonary:     Effort: Pulmonary effort is normal. No respiratory distress.     Breath sounds: Normal breath sounds.  Abdominal:     Palpations: Abdomen is soft.     Tenderness: There is no abdominal tenderness.  Musculoskeletal:        General: No swelling.     Cervical back: Neck supple.  Skin:    General: Skin is warm and dry.     Capillary Refill: Capillary refill takes less than 2 seconds.  Neurological:     General: No focal deficit present.     Mental Status: She is alert and oriented to person, place, and time.     GCS: GCS eye subscore is 4. GCS verbal subscore is 5. GCS motor subscore is 6.     Cranial Nerves: Facial asymmetry present.     Sensory: Sensation is intact.     Comments: Left upper extremity and lower extremity weakness when compared to the right, still 5 out of 5 strength.  No sensory deficits noted. Chronic right-sided facial droop.  Psychiatric:        Mood and Affect: Mood normal.     ED Results / Procedures / Treatments   Labs (all labs ordered are listed, but only abnormal results are displayed) Labs Reviewed  BASIC METABOLIC PANEL - Abnormal; Notable for the following components:      Result  Value   Glucose, Bld 106 (*)    BUN 26 (*)    Creatinine, Ser 1.12 (*)    GFR, Estimated 50 (*)    All other components within normal limits  HEMOGLOBIN A1C - Abnormal; Notable for the following components:   Hgb A1c MFr Bld 6.0 (*)    All other components within normal limits  CBC WITH DIFFERENTIAL/PLATELET  PROTIME-INR  TSH  BASIC METABOLIC PANEL  CBC  LIPID PANEL    EKG EKG Interpretation  Date/Time:  Monday June 04 2022 12:01:29 EDT Ventricular Rate:  77 PR Interval:  160 QRS Duration: 78 QT Interval:  370 QTC Calculation: 418 R Axis:   25 Text Interpretation: Normal sinus rhythm No significant change since last tracing Confirmed  by Lajean Saver [phone]) on 06/04/2022 4:11:03 PM  Radiology MR BRAIN WO CONTRAST  Result Date: 06/04/2022 CLINICAL DATA:  Slurred speech and dizziness EXAM: MRI HEAD WITHOUT CONTRAST TECHNIQUE: Multiplanar, multiecho pulse sequences of the brain and surrounding structures were obtained without intravenous contrast. COMPARISON:  10/29/2018 FINDINGS: Brain: Acute subcortical infarct within the right anterior cerebral artery territory. Chronic blood products at the medial left temporal lobe. There is multifocal hyperintense T2-weighted signal within the white matter. Generalized volume loss. The midline structures are normal. Vascular: Major flow voids are preserved. Skull and upper cervical spine: Normal calvarium and skull base. Visualized upper cervical spine and soft tissues are normal. Sinuses/Orbits:No paranasal sinus fluid levels or advanced mucosal thickening. No mastoid or middle ear effusion. Normal orbits. 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 No hemorrhage or mass effect. Electronically Signed   By: Ulyses Jarred M.D.   On: 06/04/2022 22:37   CT ANGIO HEAD NECK W WO CM  Result Date: 06/04/2022 CLINICAL DATA:  Right-sided weakness EXAM: CT ANGIOGRAPHY HEAD AND NECK TECHNIQUE:  Multidetector CT imaging of the head and neck was performed using the standard protocol during bolus administration of intravenous contrast. Multiplanar CT image reconstructions and MIPs were obtained to evaluate the vascular anatomy. Carotid stenosis measurements (when applicable) are obtained utilizing NASCET criteria, using the distal internal carotid diameter as the denominator. RADIATION DOSE REDUCTION: This exam was performed according to the departmental dose-optimization program which includes automated exposure control, adjustment of the mA and/or kV according to patient size and/or use of iterative reconstruction technique. CONTRAST:  2m OMNIPAQUE IOHEXOL 350 MG/ML SOLN COMPARISON:  10/30/2018 FINDINGS: CT HEAD FINDINGS Brain: There is no mass, hemorrhage or extra-axial collection. The size and configuration of the ventricles and extra-axial CSF spaces are normal. There is no acute or chronic infarction. The brain parenchyma is normal. Skull: The visualized skull base, calvarium and extracranial soft tissues are normal. Sinuses/Orbits: No fluid levels or advanced mucosal thickening of the visualized paranasal sinuses. No mastoid or middle ear effusion. The orbits are normal. CTA NECK FINDINGS SKELETON: There is no bony spinal canal stenosis. No lytic or blastic lesion. OTHER NECK: Normal pharynx, larynx and major salivary glands. No cervical lymphadenopathy. Unremarkable thyroid gland. UPPER CHEST: No pneumothorax or pleural effusion. No nodules or masses. AORTIC ARCH: There is no calcific atherosclerosis of the aortic arch. There is no aneurysm, dissection or hemodynamically significant stenosis of the visualized portion of the aorta. Conventional 3 vessel aortic branching pattern. The visualized proximal subclavian arteries are widely patent. RIGHT CAROTID SYSTEM: No dissection, occlusion or aneurysm. Mild atherosclerotic calcification at the carotid bifurcation without hemodynamically significant  stenosis. LEFT CAROTID SYSTEM: No dissection, occlusion or aneurysm. Mild atherosclerotic calcification at the carotid bifurcation without hemodynamically significant stenosis. VERTEBRAL ARTERIES: Left dominant configuration. Both origins are clearly patent. There is no dissection, occlusion or flow-limiting stenosis to the skull base (V1-V3 segments). CTA HEAD FINDINGS POSTERIOR CIRCULATION: --Vertebral arteries: Normal V4 segments. --Inferior cerebellar arteries: Normal. --Basilar artery: Normal. --Superior cerebellar arteries: Normal. 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 This has worsened compared to 10/30/2018, within the level of occlusion with slightly more distal. ANTERIOR CIRCULATION: --Intracranial internal carotid arteries: Normal. --Anterior cerebral arteries (ACA): Normal. Both A1 segments are present. Patent anterior communicating artery (a-comm). --Middle cerebral arteries (MCA): Normal. VENOUS SINUSES: As permitted by contrast timing, patent. ANATOMIC VARIANTS: None Review of the MIP images confirms the above findings. IMPRESSION: 1. 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 2. No other intracranial arterial occlusion or high-grade stenosis. 3. Mild carotid atherosclerosis without hemodynamically significant stenosis. Electronically Signed   By: Ulyses Jarred M.D.   On: 06/04/2022 22:22   CT Head Wo Contrast  Result Date: 06/04/2022 CLINICAL DATA:  TIA. Patient complains of slurred speech and dizziness. Patient reports history of TIAs and vertigo. EXAM: CT HEAD WITHOUT CONTRAST TECHNIQUE: Contiguous axial images were obtained from the base of the skull through the vertex without intravenous contrast. RADIATION DOSE REDUCTION: This exam was performed according to the departmental dose-optimization program which includes automated exposure control, adjustment of the mA and/or kV according to patient size and/or use of  iterative reconstruction technique. COMPARISON:  CT head 10/29/2018 FINDINGS: Brain: No evidence of acute infarction, hemorrhage, hydrocephalus, extra-axial collection or mass lesion/mass effect. Generalized parenchymal volume loss. Scattered hypodensities in the subcortical and periventricular white matter, consistent with chronic small-vessel ischemic changes. Vascular: Vascular calcifications at the skull base. No hyperdense vessel or unexpected calcification. Skull: Normal. Negative for fracture or focal lesion. Sinuses/Orbits: No acute finding. Mucous retention cyst in the right maxillary sinus. Trace mucosal thickening in the left maxillary sinus. The other visualized paranasal sinuses and mastoid air cells are clear. Other: None. IMPRESSION: No acute intracranial abnormality. Generalized parenchymal volume loss and chronic small-vessel ischemic changes. Electronically Signed   By: Ileana Roup M.D.   On: 06/04/2022 13:46    Procedures Procedures    Medications Ordered in ED Medications  enoxaparin (LOVENOX) injection 40 mg (40 mg Subcutaneous Given 06/04/22 2305)  acetaminophen (TYLENOL) tablet 650 mg (has no administration in time range)    Or  acetaminophen (TYLENOL) suppository 650 mg (has no administration in time range)  polyethylene glycol (MIRALAX / GLYCOLAX) packet 17 g (has no administration in time range)  insulin aspart (novoLOG) injection 0-15 Units (has no administration in time range)  aspirin tablet 325 mg (325 mg Oral Given 06/04/22 2305)    And  aspirin EC tablet 81 mg (has no administration in time range)  ibuprofen (ADVIL) tablet 800 mg (has no administration in time range)  iohexol (OMNIPAQUE) 350 MG/ML injection 75 mL (75 mLs Intravenous Contrast Given 06/04/22 2159)    ED Course/ Medical Decision Making/ A&P                           Medical Decision Making Amount and/or Complexity of Data Reviewed Labs: ordered. Radiology: ordered.  Risk Decision regarding  hospitalization.   Medical Decision Making  This patient is Presenting for Evaluation of slurred speech, vertigo, patient has a past medical history TIA, HTN which complicates their presentation.  Of which does require a range of treatment options, and is a complaint that involves a high risk of morbidity and mortality.  Arrived in ED by: POV  History obtained from: The patient, the patient's wife Limitations in history: none    At this time I am most concerned for acute ischemic stroke. Also considering embolic stroke, intracranial hemorrhage, intracranial mass, TIA, metabolic abnormality, hematologic abnormality. Plan for laboratory and imaging study work-up    I interpreted the ECG. It reveals a sinus rhythm. The QTc, PR, and QRS are appropriate. There are no signs of acute ischemia or of significant electrical abnormalities. The ECG does not show a STEMI. There are no ST depressions. There are no T wave inversions. There is no evidence of a High-Grade Conduction Block.  Laboratory work-up significant for:  --CBC, BMP, coagulation panel unremarkable   Radiologic work-up was significant for: -CT head with no acute abnormality   Interventions and Interval History: -Patient is generally well-appearing on initial exam however the patient had persistent slurred speech and persistent vertigo.  Patient's physical exam findings and reported history was concerning for acute ischemic stroke.  Due to the patient's presentation and the patient's persistent symptoms, patient is felt to require admission for further neurologic evaluation.      Disposition: Due to the patients current presenting symptoms, physical exam findings, and the workup stated above, it is thought that the etiology of the patients current presentation is acute ischemic stroke    ADMIT: Patient is thought to require admission for further neurologic valuation. Patient will be admitted to internal medicine service.  Please see in patient provider note for additional treatment plan details.    The plan for this patient was discussed with Dr. Ashok Cordia, who voiced agreement and who oversaw evaluation and treatment of this patient.     Clinical Complexity  A medically appropriate history, review of systems, and physical exam was performed.   I personally reviewed the lab and imaging studies discussed above.   MDM generated using voice dictation software and may contain dictation errors. Please contact me for any clarification or with any questions.               Final Clinical Impression(s) / ED Diagnoses Final diagnoses:  None    Rx / DC Orders ED Discharge Orders     None         Wynston Romey, [place], MD 06/05/22 1388    Lajean Saver, MD 06/08/22 1317

## 2022-06-04 NOTE — ED Triage Notes (Addendum)
Reports slurred speech that started at 0930 yesterday morning.  Reports weird sensation in left hand.  Patient has bells palsy effecting right side of face.  Denies headache, vision changes, sensation difference. Right sided weakness noted.,  Difficulty walking due to recent knee replacement.  Uses a walker.  Also reports vertigo.

## 2022-06-05 ENCOUNTER — Other Ambulatory Visit: Payer: Self-pay | Admitting: Cardiology

## 2022-06-05 ENCOUNTER — Observation Stay (HOSPITAL_BASED_OUTPATIENT_CLINIC_OR_DEPARTMENT_OTHER): Payer: MEDICARE

## 2022-06-05 DIAGNOSIS — I6389 Other cerebral infarction: Secondary | ICD-10-CM | POA: Diagnosis not present

## 2022-06-05 DIAGNOSIS — I639 Cerebral infarction, unspecified: Secondary | ICD-10-CM

## 2022-06-05 DIAGNOSIS — I6320 Cerebral infarction due to unspecified occlusion or stenosis of unspecified precerebral arteries: Secondary | ICD-10-CM | POA: Diagnosis not present

## 2022-06-05 LAB — LIPID PANEL
Cholesterol: 172 mg/dL (ref 0–200)
HDL: 71 mg/dL (ref >40)
LDL Cholesterol: 68 mg/dL (ref 0–99)
Total CHOL/HDL Ratio: 2.4 RATIO
Triglycerides: 165 mg/dL — ABNORMAL HIGH (ref <150)
VLDL: 33 mg/dL (ref 0–40)

## 2022-06-05 LAB — CBC
HCT: 36.4 % (ref 36.0–46.0)
Hemoglobin: 12.2 g/dL (ref 12.0–15.0)
MCH: 30.5 pg (ref 26.0–34.0)
MCHC: 33.5 g/dL (ref 30.0–36.0)
MCV: 91 fL (ref 80.0–100.0)
Platelets: 246 10*3/uL (ref 150–400)
RBC: 4 MIL/uL (ref 3.87–5.11)
RDW: 13.4 % (ref 11.5–15.5)
WBC: 7.6 10*3/uL (ref 4.0–10.5)
nRBC: 0 % (ref 0.0–0.2)

## 2022-06-05 LAB — BASIC METABOLIC PANEL
Anion gap: 8 (ref 5–15)
BUN: 26 mg/dL — ABNORMAL HIGH (ref 8–23)
CO2: 28 mmol/L (ref 22–32)
Calcium: 9.7 mg/dL (ref 8.9–10.3)
Chloride: 104 mmol/L (ref 98–111)
Creatinine, Ser: 1.05 mg/dL — ABNORMAL HIGH (ref 0.44–1.00)
GFR, Estimated: 54 mL/min — ABNORMAL LOW (ref >60)
Glucose, Bld: 120 mg/dL — ABNORMAL HIGH (ref 70–99)
Potassium: 3.8 mmol/L (ref 3.5–5.1)
Sodium: 140 mmol/L (ref 135–145)

## 2022-06-05 LAB — GLUCOSE, CAPILLARY
Glucose-Capillary: 107 mg/dL — ABNORMAL HIGH (ref 70–99)
Glucose-Capillary: 147 mg/dL — ABNORMAL HIGH (ref 70–99)

## 2022-06-05 LAB — ECHOCARDIOGRAM COMPLETE BUBBLE STUDY
Area-P 1/2: 3.72 cm2
Calc EF: 56.3 %
MV M vel: 2.43 m/s
MV Peak grad: 23.6 mmHg
S' Lateral: 2.4 cm
Single Plane A2C EF: 56.3 %
Single Plane A4C EF: 57.4 %

## 2022-06-05 MED ORDER — ASPIRIN 81 MG PO TBEC: 81.0000 mg | DELAYED_RELEASE_TABLET | Freq: Every day | ORAL | 12 refills | Status: DC

## 2022-06-05 MED ORDER — CLOPIDOGREL BISULFATE 75 MG PO TABS: 75.0000 mg | ORAL_TABLET | Freq: Every day | ORAL | 0 refills | Status: AC

## 2022-06-05 MED ORDER — CLOPIDOGREL BISULFATE 75 MG PO TABS
75.0000 mg | ORAL_TABLET | Freq: Every day | ORAL | Status: DC
  Administered 2022-06-05: 75 mg via ORAL
  Filled 2022-06-05: qty 1

## 2022-06-05 MED ORDER — IBUPROFEN 800 MG PO TABS: 800.0000 mg | ORAL_TABLET | Freq: Four times a day (QID) | ORAL | 0 refills | Status: DC | PRN

## 2022-06-05 NOTE — Evaluation (Signed)
Physical Therapy Evaluation Patient Details Name: Beth Macdonald MRN: [id number] DOB: [birth year] Today's Date: 06/05/2022  History of Present Illness  Patient is a 76 year old female with a past medical history of TIA 10-2018, facial nerve palsy, vertigo, hypertension, and knee osteoarthritis s/p R TKA who presents for slurred speech and dizziness. she tried the Epley maneuver and the dizziness persisted and speech became more slurred. MRI of brain reported acute subcortical infarct within the right anterior cerebral artery territory  Clinical Impression  Patient was agreeable to PT evaluation. She was seated in the chair and reports no dizziness today. At baseline she uses a rolling walker for ambulation intermittently since her knee surgery in May. She reports 3 falls since surgery, 2 were medication related and one due to vertigo.  Today, the patient required minimal assistance for standing for facilitation for anterior weight shifting. She ambulated in hallway, slow but steady cadence with no dizziness reported with upright activity. The patient is hopeful to return home soon with assistance from her spouse and HHPT. PT will continue to follow to maximize independence and facilitate return to prior level of function.       Recommendations for follow up therapy are one component of a multi-disciplinary discharge planning process, led by the attending physician.  Recommendations may be updated based on patient status, additional functional criteria and insurance authorization.  Follow Up Recommendations Home health PT      Assistance Recommended at Discharge Intermittent Supervision/Assistance  Patient can return home with the following  A little help with walking and/or transfers;A little help with bathing/dressing/bathroom;Assist for transportation;Help with stairs or ramp for entrance    Equipment Recommendations Rolling walker (2 wheels) (her rolling walker is broken)  Recommendations  for Other Services       Functional Status Assessment Patient has had a recent decline in their functional status and demonstrates the ability to make significant improvements in function in a reasonable and predictable amount of time.     Precautions / Restrictions Precautions Precautions: Fall Precaution Comments: Pt has fallen several times since her knee replacement in 01/2022. Restrictions Weight Bearing Restrictions: No      Mobility  Bed Mobility               General bed mobility comments: not assessed as patient sitting up on arrival and post session    Transfers Overall transfer level: Needs assistance Equipment used: Rolling walker (2 wheels) Transfers: Sit to/from Stand Sit to Stand: Min assist           General transfer comment: cues for hand placement for safety. patient required faciliation for anterior weight shifting due to posterior bias when standing which she reports she has occasionally at home (spouse will assist as needed)    Ambulation/Gait Ambulation/Gait assistance: Min guard Gait Distance (Feet): 100 Feet Assistive device: Rolling walker (2 wheels) Gait Pattern/deviations: Step-through pattern Gait velocity: decreased     General Gait Details: slow but steady cadence without loss of balance. no dizziness is reported with ambulation  Stairs            Wheelchair Mobility    Modified Rankin (Stroke Patients Only)       Balance Overall balance assessment: Needs assistance Sitting-balance support: Feet supported Sitting balance-Leahy Scale: Fair   Postural control: Posterior lean Standing balance support: Bilateral upper extremity supported Standing balance-Leahy Scale: Fair Standing balance comment: no external support required from therapist. RW used for UE support in standing. patient able to stand at  sink to brush her teeth with supervision for safety                             Pertinent Vitals/Pain Pain  Assessment Pain Assessment: No/denies pain    Home Living Family/patient expects to be discharged to:: Private residence Living Arrangements: Spouse/significant other Available Help at Discharge: Family;Available 24 hours/day Type of Home: House Home Access: Level entry       Home Layout: One level Home Equipment: Shower seat - built in;Grab bars - tub/shower;Rolling Walker (2 wheels);Other (comment) Additional Comments: rolling walker is broken after previous fall    Prior Function Prior Level of Function : Independent/Modified Independent;History of Falls (last six months) (3 falls reported since TKA (2 medication related post knee surgery, and one due to vertigo))             Mobility Comments: occasional assistance with standing due to right knee pain with weight bearing at times. otherwise, no physical assistance required with mobility. intermittent use of rolling walker ADLs Comments: Mod I     Hand Dominance   Dominant Hand: Right    Extremity/Trunk Assessment   Upper Extremity Assessment Upper Extremity Assessment: Overall WFL for tasks assessed    Lower Extremity Assessment Lower Extremity Assessment: RLE deficits/detail;LLE deficits/detail RLE Deficits / Details: AROM knee WFL for functional activity. dorsiflexion, plantarflexion 5/5, hip add/abd 5/5 RLE Sensation: WNL LLE Deficits / Details: dorsiflexion, plantarflexion 5/5. hip adduction 4/5, hip abduction 4/5 LLE Sensation: WNL    Cervical / Trunk Assessment Cervical / Trunk Assessment: Normal  Communication   Communication:  (dysarthric)  Cognition Arousal/Alertness: Awake/alert Behavior During Therapy: WFL for tasks assessed/performed Overall Cognitive Status: Impaired/Different from baseline Area of Impairment: Memory, Problem solving                     Memory: Decreased recall of precautions, Decreased short-term memory       Problem Solving: Slow processing General Comments:  patient reports she is having difficulty with recall of words occasionally. speech is better but not back to baseline per patient report        General Comments General comments (skin integrity, edema, etc.): encouraged patient to use rolling walker for support with ambulation    Exercises     Assessment/Plan    PT Assessment Patient needs continued PT services  PT Problem List Decreased strength;Decreased balance;Decreased activity tolerance;Decreased mobility;Decreased cognition;Decreased safety awareness       PT Treatment Interventions DME instruction;Gait training;Stair training;Functional mobility training;Therapeutic activities;Therapeutic exercise;Balance training;Neuromuscular re-education;Cognitive remediation;Patient/family education    PT Goals (Current goals can be found in the Care Plan section)  Acute Rehab PT Goals Patient Stated Goal: to go home PT Goal Formulation: With patient Time For Goal Achievement: 06/12/22 Potential to Achieve Goals: Good    Frequency Min 3X/week     Co-evaluation               AM-PAC PT "6 Clicks" Mobility  Outcome Measure Help needed turning from your back to your side while in a flat bed without using bedrails?: None Help needed moving from lying on your back to sitting on the side of a flat bed without using bedrails?: A Little Help needed moving to and from a bed to a chair (including a wheelchair)?: A Little Help needed standing up from a chair using your arms (e.g., wheelchair or bedside chair)?: A Little Help needed to walk in hospital  room?: A Little Help needed climbing 3-5 steps with a railing? : A Little 6 Click Score: 19    End of Session Equipment Utilized During Treatment: Gait belt Activity Tolerance: Patient tolerated treatment well Patient left: in chair;with call bell/phone within reach;with chair alarm set   PT Visit Diagnosis: Unsteadiness on feet (R26.81);Other symptoms and signs involving the nervous  system (R29.898)    Time: 3016-0109 PT Time Calculation (min) (ACUTE ONLY): 24 min   Charges:   PT Evaluation $PT Eval Low Complexity: 1 Low          Minna Merritts, PT, MPT   Percell Locus 06/05/2022, 10:13 AM

## 2022-06-05 NOTE — Discharge Summary (Cosign Needed Addendum)
Name: Beth Macdonald MRN: [id number] DOB: 07/26/1946 76 y.o. PCP: [name], MD  Date of Admission: 06/04/2022 11:55 AM Date of Discharge: 06/05/2022 Attending Physician: Lucious Groves, DO  Discharge Diagnosis: Principal Problem:   CVA (cerebral vascular accident) [facility]) Active Problems:   OA (osteoarthritis) of knee   TIA (transient ischemic attack)   Dyslipidemia   Type 2 diabetes mellitus with hyperlipidemia ([place])   HTN (hypertension)     Discharge Medications: Allergies as of 06/05/2022       Reactions   Clindamycin/lincomycin Other (See Comments)   Cannot tolerate mycins causes C-Diff   Eggs Or Egg-derived Products Diarrhea   Milk-related Compounds Diarrhea   Tetracyclines & Related Other (See Comments)   "Feel bad"        Medication List     STOP taking these medications    HYDROmorphone 2 MG tablet Commonly known as: DILAUDID   methocarbamol 500 MG tablet Commonly known as: ROBAXIN   oxyCODONE 5 MG immediate release tablet Commonly known as: Oxy IR/ROXICODONE   traMADol 50 MG tablet Commonly known as: ULTRAM       TAKE these medications    aspirin EC 81 MG tablet Take 1 tablet (81 mg total) by mouth daily. Swallow whole. Start taking on: June 06, 2022   clobetasol cream 0.05 % Commonly known as: TEMOVATE Apply 1 Application topically 2 (two) times daily.   Clobetasol Prop Emollient Base 0.05 % emollient cream Commonly known as: Clobetasol Propionate E Apply 1 Application topically 2 (two) times daily.   clopidogrel 75 MG tablet Commonly known as: PLAVIX Take 1 tablet (75 mg total) by mouth daily for 20 days. Start taking on: June 06, 2022   cycloSPORINE 0.05 % ophthalmic emulsion Commonly known as: RESTASIS Place 1 drop into both eyes 2 (two) times daily.   desonide 0.05 % cream Commonly known as: DESOWEN Apply topically 2 (two) times daily as needed (Rash).   ibuprofen 800 MG tablet Commonly known as: ADVIL Take 1  tablet (800 mg total) by mouth every 6 (six) hours as needed for mild pain or moderate pain.   lisinopril 5 MG tablet Commonly known as: ZESTRIL Take 5 mg by mouth daily.   metFORMIN 500 MG tablet Commonly known as: GLUCOPHAGE Take 1,000 mg by mouth 2 (two) times daily with a meal.   ondansetron 4 MG tablet Commonly known as: ZOFRAN Take 1 tablet (4 mg total) by mouth every 6 (six) hours as needed for nausea.   Repatha SureClick 267 MG/ML Soaj Generic drug: Evolocumab Inject 140 mg into the skin every 14 (fourteen) days. 'Sunday   Rybelsus 7 MG Tabs Generic drug: Semaglutide Take 7 mg by mouth daily.   SYSTANE FREE OP Apply 1 drop to eye daily as needed (dryness/irritation).   Tolak 4 % Crea Generic drug: Fluorouracil Apply 1 application  topically at bedtime. X 2 weeks.   TUMS PO Take 1 tablet by mouth daily as needed (acid control).               Durable Medical Equipment  (From admission, onward)           Start     Ordered   06/05/22 1314  For home use only DME Walker rolling  Once       Question Answer Comment  Walker: With 5 Inch Wheels   Patient needs a walker to treat with the following condition Stroke (HCC)      10'$ /03/23 1313  Disposition and follow-up:   Ms.Beth Macdonald was discharged from [facility] in Stable condition.  At the hospital follow up visit please address:  1.  Ensure patient has adequate blood pressure control, check for improvement or recurrence of dizziness and dysarthria  2.  Labs / imaging needed at time of follow-up: none, can consider lipid panel  3.  Pending labs/ test needing follow-up: none   Follow-up Appointments:  Follow-up Information     [name], MD Follow up in 8 week(s).   Specialties: Neurology, Radiology Contact information: [address] [place] [address] [place], [facility] Follow up.   Specialty: Home  Health Services Why: The home health agency will contact you for the first home visit. Contact information: [place] [address] [phone]                Please follow-up with your PCP, Dr [name] Please also follow up with [facility] in 23 weeks   Hospital Course by problem list:  ---Acute subcortical infarct within R ACA territory ---Occlusion of L PCA segmental branch ---History of transient ischemic attack 10-2018 At baseline, the patient has right-sided facial nerve palsy and benign paroxysmal positional vertigo. Patient presented with central dizziness, truncal instability, and slurred speech that began roughly 33hr prior to arrival. Blood pressure was elevated to 187/92. Head CT negative for acute pathology. Brain MRI revealed acute subcortical infarct within right ACA territory and CT angiogram demonstrated occlusion in segmental branch of left PCA. These findings explain her symptoms, PCA occlusion causing transient dizziness and ACA-territory infarct leading to dysarthria. Etiological workup including lipid panel, echocardiogram with bubble study, and TSH were all unremarkable. Treatment with aspirin and clopidogrel were initiated. Atorvastatin deemed unnecessary given normal lipid panel and evolocumab use at home. Since admission, the patient's dizziness has nearly resolved and slurred speech improved significantly. Physical and occupational teams are recommending continued therapy with home health while speech suggests outpatient services. Neurology has also evaluated the patient and cleared her for discharge on 10-3.     ---Hypertension Patient reports a history of hypertension that has been well controlled with medications. She takes lisinopril at home as prescribed and stopped self-monitoring her blood pressure due to persistently normal values. Upon arrival to the ED her blood pressure was 187/92, most likely a  physiologic response to the infarction and occlusion identified on imaging. Treatment team held her home lisinopril throughout hospitalization to permit hypertension and ensure parenchymal perfusion.      Discharge Exam:   BP (!) 189/92 (BP Location: Right Arm)   Pulse 79   Temp (!) 97.3 F (36.3 C) (Oral)   Resp 14   Ht '5\' 8"'$  (1.727 m)   Wt 79.4 kg   SpO2 98%   BMI 26.61 kg/m   Discharge exam:   General:                       awake and alert, lying comfortably in bed, cooperative, not in acute distress Skin:                             warm and dry, intact without any obvious lesions or scars, no rashes Head:  normocephalic and atraumatic, oral mucosa moist with good dentition Eyes:                            extraocular movements intact, slight nystagmus with rightward gaze Lungs:                          normal respiratory effort, breathing unlabored, symmetrical chest rise Cardiac:                        regular rate and rhythm, normal S1 and S2, no pitting edema Musculoskeletal:          motor strength 5 /5 in all four extremities except 5- /5 in hip flexion, well-healed vertical incision scar consistent with TKA on R knee Neurologic:                   oriented to person-place-time, moving all extremities, no gross focal deficits Psychiatric:                   mood and affect normal, intelligible speech    Pertinent Labs, Studies, and Procedures:      Latest Ref Rng & Units 06/05/2022    4:26 AM 06/04/2022   12:19 PM 01/02/2022    3:47 AM  CBC  WBC 4.0 - 10.5 K/uL 7.6  7.2  15.4   Hemoglobin 12.0 - 15.0 g/dL 12.2  12.7  12.8   Hematocrit 36.0 - 46.0 % 36.4  38.7  36.8   Platelets 150 - 400 K/uL 246  248  231       Latest Ref Rng & Units 06/05/2022    4:26 AM 06/04/2022   12:19 PM 01/02/2022    3:47 AM  CMP  Glucose 70 - 99 mg/dL 120  106  170   BUN 8 - 23 mg/dL '26  26  23   '$ Creatinine 0.44 - 1.00 mg/dL 1.05  1.12  0.96   Sodium 135 -  145 mmol/L 140  140  138   Potassium 3.5 - 5.1 mmol/L 3.8  4.2  3.9   Chloride 98 - 111 mmol/L 104  106  103   CO2 22 - 32 mmol/L '28  25  24   '$ Calcium 8.9 - 10.3 mg/dL 9.7  9.7  8.5    Lipid Panel     Component Value Date/Time   CHOL 172 06/05/2022 0426   TRIG 165 (H) 06/05/2022 0426   HDL 71 06/05/2022 0426   CHOLHDL 2.4 06/05/2022 0426   VLDL 33 06/05/2022 0426   LDLCALC 68 06/05/2022 0426    ECHOCARDIOGRAM COMPLETE BUBBLE STUDY Result Date: 06/05/2022 FINDINGS  Left Ventricle: Left ventricular ejection fraction, by estimation, is 60 to 65%. The left ventricle has normal function. The left ventricle has no regional wall motion abnormalities. The left ventricular internal cavity size was normal in size. There is  no left ventricular hypertrophy. Left ventricular diastolic parameters are consistent with Grade I diastolic dysfunction (impaired relaxation). Normal left ventricular filling pressure. Right Ventricle: The right ventricular size is normal. No increase in right ventricular wall thickness. Right ventricular systolic function is normal. Tricuspid regurgitation signal is inadequate for assessing PA pressure. Left Atrium: Left atrial size was normal in size. Right Atrium: Right atrial size was normal in size. Pericardium: There is no evidence of pericardial effusion. Mitral Valve: The mitral valve is normal in structure.  No evidence of mitral valve regurgitation. No evidence of mitral valve stenosis. Tricuspid Valve: The tricuspid valve is normal in structure. Tricuspid valve regurgitation is not demonstrated. No evidence of tricuspid stenosis. Aortic Valve: The aortic valve is normal in structure. There is mild calcification of the aortic valve. There is moderate thickening of the aortic valve. Aortic valve regurgitation is not visualized. Aortic valve sclerosis/calcification is present, without any evidence of aortic stenosis. Pulmonic Valve: The pulmonic valve was normal in structure.  Pulmonic valve regurgitation is not visualized. No evidence of pulmonic stenosis. Aorta: The aortic root is normal in size and structure. Venous: The inferior vena cava is normal in size with greater than 50% respiratory variability, suggesting right atrial pressure of 3 mmHg. IAS/Shunts: No atrial level shunt detected by color flow Doppler. Agitated saline contrast was given intravenously to evaluate for intracardiac shunting. Agitated saline contrast bubble study was negative, with no evidence of any interatrial shunt.    MR BRAIN WO CONTRAST Result Date: 06/04/2022 IMPRESSION: Acute subcortical infarct within the right anterior cerebral artery territory. No hemorrhage or mass effect.   CT ANGIO HEAD NECK W WO CM Result Date: 06/04/2022 IMPRESSION: 1. Occlusion of the left PCA P1 segment, slightly more proximal than on 10/30/2018. 2. No other intracranial arterial occlusion or high-grade stenosis. 3. Mild carotid atherosclerosis without hemodynamically significant stenosis.  CT Head Wo Contrast Result Date: 06/04/2022 IMPRESSION: No acute intracranial abnormality. Generalized parenchymal volume loss and chronic small-vessel ischemic changes.     Discharge Instructions: Discharge Instructions     Ambulatory referral to Neurology   Complete by: As directed    An appointment is requested in approximately: 8 weeks      Ms Lough, it was a pleasure taking care of you while you were in the hospital. Your symptoms of dizziness and slurred speech are explained by the CT angiogram and MRI results. The dizziness was caused by blockage of an artery that supplies the back of your brain, there was no permanent damage done to this region. The slurred speech was caused by lack of blood flow to a small area in the front of your brain, this resulted in a stroke and some permanent damage. It is important to continue exercising your speech muscles and practice talking over the coming weeks, in order to  maximize your chances for a full recovery. We have arranged for you to receive help with these rehabilitation exercises, either at home or as an outpatient. Finally, we are giving you some medications to take at home that will help prevent another stroke in the future. You will take a combination of clopidogrel and aspirin for three weeks. After those three weeks, which end on 10-24, you will stop clopidogrel and continue to take the aspirin indefinitely. Please return to the ED if any of your symptoms, including dizziness or slurred speech, return.   Signed: Roswell Nickel, MD Internal Medicine PGY-1 Pager [phone]

## 2022-06-05 NOTE — Care Management Obs Status (Signed)
[place] NOTIFICATION   Patient Details  Name: SARAIA PLATNER MRN: [id number] Date of Birth: 12/30/1945   Medicare Observation Status Notification Given:  Yes    Pollie Friar, RN 06/05/2022, 1:41 PM

## 2022-06-05 NOTE — Evaluation (Signed)
Occupational Therapy Evaluation Patient Details Name: Beth Macdonald MRN: [id number] DOB: [birth year] Today's Date: 06/05/2022   History of Present Illness Pt is a 76 yo female admitted with slurred speech, vertigo and sensation changes in L hand and mild weakness in L hand. CT-. 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   Clinical Impression   Pt admitted with the above diagnosis and has the deficits listed below. Pt would benefit from cont OT to increase independence with basic adls and adl transfers so she can d/c home with her wife. Wife is there all the time and can assist as needed. Feel pt may need rolling walker to be safe in standing initially. Pt states he rolling walker she received from TKA in 5/23 is broken.  No other equipment needs. Rec HHOT.      Recommendations for follow up therapy are one component of a multi-disciplinary discharge planning process, led by the attending physician.  Recommendations may be updated based on patient status, additional functional criteria and insurance authorization.   Follow Up Recommendations  Home health OT    Assistance Recommended at Discharge Frequent or constant Supervision/Assistance  Patient can return home with the following A little help with walking and/or transfers;A little help with bathing/dressing/bathroom;Assistance with cooking/housework;Assist for transportation;Help with stairs or ramp for entrance    Functional Status Assessment  Patient has had a recent decline in their functional status and demonstrates the ability to make significant improvements in function in a reasonable and predictable amount of time.  Equipment Recommendations  None recommended by OT    Recommendations for Other Services       Precautions / Restrictions Precautions Precautions: Fall Precaution Comments: Pt has fallen several times since her knee replacement in 01/2022. Restrictions Weight Bearing Restrictions: No      Mobility  Bed Mobility Overal bed mobility: Needs Assistance Bed Mobility: Supine to Sit     Supine to sit: Min assist, HOB elevated     General bed mobility comments: extra time and cues given to scoot to EOB and get feet on floor for stability    Transfers Overall transfer level: Needs assistance Equipment used: Rolling walker (2 wheels) Transfers: Sit to/from Stand, Bed to chair/wheelchair/BSC Sit to Stand: Min assist     Step pivot transfers: Min assist     General transfer comment: cues for hand placement on walker and bed height raised due to pain in knee from TKA.      Balance Overall balance assessment: Needs assistance Sitting-balance support: Feet supported Sitting balance-Leahy Scale: Fair   Postural control: Posterior lean Standing balance support: Bilateral upper extremity supported, Reliant on assistive device for balance, During functional activity Standing balance-Leahy Scale: Fair Standing balance comment: relied on walker when dynamic balance challenged.                           ADL either performed or assessed with clinical judgement   ADL Overall ADL's : Needs assistance/impaired Eating/Feeding: Independent;Sitting   Grooming: Wash/dry hands;Oral care;Wash/dry face;Min guard;Standing Grooming Details (indicate cue type and reason): Pt stood at sink to groom with min guard for balance. Pt with mid posterior lean that she is aware of and corrected with VCs only Upper Body Bathing: Set up;Sitting   Lower Body Bathing: Minimal assistance;Sit to/from stand;Cueing for compensatory techniques   Upper Body Dressing : Set up;Sitting   Lower Body Dressing: Minimal assistance;Sit to/from stand;Cueing for compensatory techniques  Lower Body Dressing Details (indicate cue type and reason): assist for balance in standing. Toilet Transfer: Minimal assistance;Stand-pivot;BSC/3in1;Rolling walker (2 wheels)   Toileting- Clothing Manipulation and Hygiene:  Supervision/safety;Sitting/lateral lean       Functional mobility during ADLs: Minimal assistance;Rolling walker (2 wheels) General ADL Comments: Pt overall requring min assist for adls due to decreased balance and general weakness.  Pt hesitant since first time up since admission but did better as session went on.     Vision Baseline Vision/History:  (bells palsy) Ability to See in Adequate Light: 0 Adequate Patient Visual Report: No change from baseline Vision Assessment?: No apparent visual deficits Additional Comments: wears glasses for distance and has readers. Both are with her.     Perception Perception Comments: intact   Praxis      Pertinent Vitals/Pain Pain Assessment Pain Assessment: No/denies pain     Hand Dominance Right   Extremity/Trunk Assessment Upper Extremity Assessment Upper Extremity Assessment: Overall WFL for tasks assessed   Lower Extremity Assessment Lower Extremity Assessment: Defer to PT evaluation   Cervical / Trunk Assessment Cervical / Trunk Assessment: Normal   Communication Communication Communication: Other (comment) (dysarthric)   Cognition Arousal/Alertness: Awake/alert Behavior During Therapy: WFL for tasks assessed/performed Overall Cognitive Status: Impaired/Different from baseline Area of Impairment: Memory, Problem solving                     Memory: Decreased recall of precautions, Decreased short-term memory       Problem Solving: Slow processing General Comments: Unsure of baseline cognition. Pt repeated self several times not recalling she had already told me information.     General Comments  Pt most limited by decreased balance and mild posterior lean when up.    Exercises     Shoulder Instructions      Home Living Family/patient expects to be discharged to:: Private residence Living Arrangements: Spouse/significant other Available Help at Discharge: Family;Available 24 hours/day Type of Home:  House Home Access: Level entry     Home Layout: One level     Bathroom Shower/Tub: Tub/shower unit;Walk-in shower   Bathroom Toilet: Standard     Home Equipment: Shower seat - built in;Grab bars - Statistician (2 wheels);Other (comment) (says walker is broken)   Additional Comments: walker is broken      Prior Functioning/Environment Prior Level of Function : Independent/Modified Independent             Mobility Comments: Independent ADLs Comments: mod I        OT Problem List: Impaired balance (sitting and/or standing);Decreased safety awareness;Decreased knowledge of use of DME or AE;Decreased cognition      OT Treatment/Interventions: Self-care/ADL training;Therapeutic activities;Balance training    OT Goals(Current goals can be found in the care plan section) Acute Rehab OT Goals Patient Stated Goal: to go home OT Goal Formulation: With patient Time For Goal Achievement: 06/19/22 Potential to Achieve Goals: Good ADL Goals Pt Will Perform Grooming: with modified independence;standing Pt Will Perform Tub/Shower Transfer: Tub transfer;rolling walker;shower seat;with supervision;grab bars Additional ADL Goal #1: Pt will walk to bathroom with walker and complete all toileting tasks with mod I Additional ADL Goal #2: Pt will gather all clothing with walker and dress self with mod I  OT Frequency: Min 2X/week    Co-evaluation              AM-PAC OT "6 Clicks" Daily Activity     Outcome Measure Help from another person eating meals?:  None Help from another person taking care of personal grooming?: None Help from another person toileting, which includes using toliet, bedpan, or urinal?: A Little Help from another person bathing (including washing, rinsing, drying)?: A Little Help from another person to put on and taking off regular upper body clothing?: None Help from another person to put on and taking off regular lower body clothing?: A  Little 6 Click Score: 21   End of Session Equipment Utilized During Treatment: Rolling walker (2 wheels) Nurse Communication: Mobility status  Activity Tolerance: Patient tolerated treatment well Patient left: in chair;with call bell/phone within reach;with chair alarm set  OT Visit Diagnosis: Unsteadiness on feet (R26.81)                Time: 4098-1191 OT Time Calculation (min): 24 min Charges:  OT General Charges $OT Visit: 1 Visit OT Evaluation $OT Eval Low Complexity: 1 Low  Glenford Peers 06/05/2022, 9:46 AM

## 2022-06-05 NOTE — TOC Transition Note (Signed)
Transition of Care [facility]) - CM/SW Discharge Note   Patient Details  Name: Beth Macdonald MRN: [id number] Date of Birth: 03-28-1946  Transition of Care [facility]) CM/SW Contact:  Pollie Friar, RN Phone Number: 06/05/2022, 1:43 PM   Clinical Narrative:    Pt is discharging home with home health services through [place]. Information on the AVS.  Pt manages her own medications and denies any issues.  Pts spouse is able to provide needed transportation and supervision at home. Walker for home ordered through [place] and will be delivered to the room.    Final next level of care: Home w Home Health Services Barriers to Discharge: No Barriers Identified   Patient Goals and CMS Choice   CMS Medicare.gov Compare Post Acute Care list provided to:: Patient Choice offered to / list presented to : Patient  Discharge Placement                       Discharge Plan and Services                DME Arranged: Walker rolling DME Agency: [employer] Date DME Agency Contacted: 06/05/22   Representative spoke with at DME Agency: Annie [facility] HH Arranged: PT, OT, Speech Therapy [place]: [place] Date [place]: 06/05/22   Representative spoke with at [place]: [place] ([place]) Interventions     Readmission Risk Interventions     No data to display

## 2022-06-05 NOTE — Consult Note (Addendum)
Neurology Consultation  Reason for Consult: subacute stroke on imaging Referring Physician: [place]  CC: dizziness  History is obtained from:patient   HPI: Beth Macdonald is a 76 y.o. female with a past medical history of TIA 10-2018, facial nerve palsy, vertigo, hypertension, and knee osteoarthritis who presents for slurred speech and dizziness.  She reports that she ate breakfast on Sunday and then became dizzy after returning to her bedroom.  She describes the dizziness as the room spinning with her staying still.  She believed that she was just having an episode of vertical and tried an Epley maneuver however the dizziness persisted which is abnormal for her.  She then noticed slurred speech, however denies word finding difficulties, headache, weakness, visual changes, swallowing difficulty, or recent illness. She is not currently having any dizziness.  She does have a history of Bell's palsy and does have a facial droop at baseline.  Her spouse denies any changes to this facial droop, but does notice the slurred speech persisting. Of note, she did have a knee replacement in May 2023 and has used a walker to ambulate since then.  LKW: 10/1 IV thrombolysis given?: no, outside of window Premorbid modified Rankin scale (mRS): 3  ROS: Full ROS was performed and is negative except as noted in the HPI.   Past Medical History:  Diagnosis Date   Aortic atherosclerosis ([place])    Diabetes mellitus without complication ([place])    Type 2   Diabetic neuropathy ([place])    DVT of proximal lower limb ([place])    Facial paralysis/Bells palsy    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[facility])    Hyperlipidemia    Mitral valve insufficiency    Osteoarthritis    SCCA (squamous cell carcinoma) of skin 03/22/2021   Bridge of nose (in situ)   Skull fracture ([facility])    age 19   Squamous cell carcinoma of skin 03/22/2021   Right forehead (in situ)   TIA (transient ischemic attack) 2020   Varicose vein of leg       Family History  Problem Relation Age of Onset   Stroke Mother    Hypertension Father    Dementia Father    Leukemia Brother    Stroke Brother    Colon cancer Neg Hx    Esophageal cancer Neg Hx    Pancreatic cancer Neg Hx    Stomach cancer Neg Hx      Social History:   reports that she quit smoking about 43 years ago. Her smoking use included cigarettes. She has a 20.00 pack-year smoking history. She has never used smokeless tobacco. She reports current alcohol use. She reports that she does not use drugs.  Medications  Current Facility-Administered Medications:    acetaminophen (TYLENOL) tablet 650 mg, 650 mg, Oral, Q6H PRN **OR** acetaminophen (TYLENOL) suppository 650 mg, 650 mg, Rectal, Q6H PRN, Gaylan Gerold, DO   aspirin tablet 325 mg, 325 mg, Oral, Daily, 325 mg at 06/04/22 2305 **AND** aspirin EC tablet 81 mg, 81 mg, Oral, Daily, Gaylan Gerold, DO   clopidogrel (PLAVIX) tablet 75 mg, 75 mg, Oral, Daily, Serita Butcher, MD   enoxaparin (LOVENOX) injection 40 mg, 40 mg, Subcutaneous, Q24H, Gaylan Gerold, DO, 40 mg at 06/04/22 2305   ibuprofen (ADVIL) tablet 800 mg, 800 mg, Oral, Q6H PRN, Gaylan Gerold, DO   polyethylene glycol (MIRALAX / GLYCOLAX) packet 17 g, 17 g, Oral, Daily PRN, Gaylan Gerold, DO  Exam: Current vital signs: BP (!) 190/73 (BP Location: Right Arm)  Comment: RN Notified  Pulse 64   Temp 97.8 F (36.6 C) (Oral)   Resp 15   Ht '5\' 8"'$  (1.727 m)   Wt 79.4 kg   SpO2 95%   BMI 26.61 kg/m  Vital signs in last 24 hours: Temp:  [97.8 F (36.6 C)-98.2 F (36.8 C)] 97.8 F (36.6 C) (10/03 0811) Pulse Rate:  [64-79] 64 (10/03 0811) Resp:  [15-21] 15 (10/03 0811) BP: (176-192)/(66-92) 190/73 (10/03 0811) SpO2:  [95 %-100 %] 95 % (10/03 0811) Weight:  [79.4 kg] 79.4 kg (10/02 1204)  GENERAL: Awake, alert in NAD HEENT: - Normocephalic and atraumatic, dry mm, no LN++, no Thyromegally LUNGS - Clear to auscultation bilaterally with no wheezes CV - S1S2 RRR,  no m/r/g, equal pulses bilaterally. ABDOMEN - Soft, nontender, nondistended with normoactive BS Ext: warm, well perfused, intact peripheral pulses, 0 edema  NEURO:  Mental Status: AA&Ox3  Language: speech is mildly dysarthric.  Naming, repetition, fluency, and comprehension intact. Cranial Nerves: PERRL, EOMI, visual fields full, right facial droop residual from old Bell's palsy (baseline), facial sensation intact, hearing intact, tongue/uvula/soft palate midline, normal sternocleidomastoid and trapezius muscle strength. No evidence of tongue atrophy or fibrillations Motor: 5/5 strength in all extremities RLE limited by pain from recent knee replacement Tone: is normal and bulk is normal Sensation- Intact to light touch bilaterally Coordination: FTN intact bilaterally, no ataxia in BLE. Gait- deferred- currently using walker due to knee replacement  NIHSS 1a Level of Conscious.: 0 1b LOC Questions: 0 1c LOC Commands: 0 2 Best Gaze: 0 3 Visual: 0 4 Facial Palsy: 0 5a Motor Arm - left: 0 5b Motor Arm - Right: 0 6a Motor Leg - Left: 0 6b Motor Leg - Right: 0 7 Limb Ataxia: 0 8 Sensory: 0 9 Best Language: 0 10 Dysarthria: 1 11 Extinct. and Inatten.: 0 TOTAL: 0   Labs I have reviewed labs in epic and the results pertinent to this consultation are:  CBC    Component Value Date/Time   WBC 7.6 06/05/2022 0426   RBC 4.00 06/05/2022 0426   HGB 12.2 06/05/2022 0426   HCT 36.4 06/05/2022 0426   PLT 246 06/05/2022 0426   MCV 91.0 06/05/2022 0426   MCH 30.5 06/05/2022 0426   MCHC 33.5 06/05/2022 0426   RDW 13.4 06/05/2022 0426   LYMPHSABS 1.8 06/04/2022 1219   MONOABS 0.7 06/04/2022 1219   EOSABS 0.2 06/04/2022 1219   BASOSABS 0.1 06/04/2022 1219    CMP     Component Value Date/Time   NA 140 06/05/2022 0426   K 3.8 06/05/2022 0426   CL 104 06/05/2022 0426   CO2 28 06/05/2022 0426   GLUCOSE 120 (H) 06/05/2022 0426   BUN 26 (H) 06/05/2022 0426   CREATININE 1.05 (H)  06/05/2022 0426   CREATININE 1.04 12/25/2012 1609   CALCIUM 9.7 06/05/2022 0426   PROT 7.1 10/29/2018 1612   ALBUMIN 4.2 10/29/2018 1612   AST 21 10/29/2018 1612   ALT 21 10/29/2018 1612   ALKPHOS 42 10/29/2018 1612   BILITOT 0.5 10/29/2018 1612   GFRNONAA 54 (L) 06/05/2022 0426   GFRAA >60 10/30/2018 0743    Lipid Panel     Component Value Date/Time   CHOL 172 06/05/2022 0426   TRIG 165 (H) 06/05/2022 0426   HDL 71 06/05/2022 0426   CHOLHDL 2.4 06/05/2022 0426   VLDL 33 06/05/2022 0426   LDLCALC 68 06/05/2022 0426     Imaging I have reviewed the images  obtained:  CT-head no acute intracranial abnormality.  Generalized parenchymal volume loss with small vessel ischemic changes  CTA head and neck -occlusion of the left P2 PCA P1 segment, slightly more proximal than 10/30/2018.  No other intracranial arterial occlusion or high-grade stenosis.  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 No hemorrhage or mass effect.  2D echo with bubble study-EF 60-65%, bubble study negative, LA size normal  Assessment: 76 y.o. female with a past medical history of TIA 10-2018, facial nerve palsy, vertigo, hypertension, and knee osteoarthritis who presents for slurred speech and dizziness.  MRI shows an acute cortical-subcortical infarct within the right anterior cerebral artery territory.  CTA head and neck does not show any high-grade occlusion or stenosis in that area.  We recommend a 30-day heart monitor for extended cardiac monitoring and medical treatment with DAPT therapy for 3 weeks and then aspirin 81 mg alone.  Other stroke risk factors include history of hyperlipidemia and she is prediabetic, managed by PCP.  Current LDL 68 and hemoglobin A1c 6.0 currently managed with Repatha, semaglutide, and metformin.   Impression: Right ACA territory  infarct, unknown etiology - suspicious for embolic etiology Prior left hemispheric TIA in 2020  Recommendations: -DAPT therapy with aspirin 81 and Plavix 75 for 3 weeks and then aspirin 81 mg alone -Requested 30-day heart monitor be sent to patient -Follow-up with [facility] outpatient in 8 weeks -Follow-up with PCP within the next 2 weeks - Continue home medications as prescribed by PCP  -- Patient seen and examined by NP/APP with MD. MD to update note as needed.   Janine Ores, DNP, FNP-BC [facility] Pager: [phone]  Attending Neurohospitalist Addendum Patient seen and examined with APP/Resident. Agree with the history and physical as documented above. Agree with the plan as documented, which I helped formulate. I have independently reviewed the chart, obtained history, review of systems and examined the patient.I have personally reviewed pertinent head/neck/spine imaging (CT/MRI). Please feel free to call with any questions.  -- Amie Portland, MD Neurologist [facility] Pager: [phone]

## 2022-06-05 NOTE — Discharge Instructions (Addendum)
Beth Macdonald, it was a pleasure taking care of you while you were in the hospital. Your symptoms of dizziness and slurred speech are explained by the CT angiogram and MRI results. The dizziness was caused by blockage of an artery that supplies the back of your brain, there was no permanent damage done to this region. The slurred speech was caused by lack of blood flow to a small area in the front of your brain, this resulted in a stroke and some permanent damage. It is important to continue exercising your speech muscles and practice talking over the coming weeks, in order to maximize your chances for a full recovery. We have arranged for you to receive help with these rehabilitation exercises, either at home or as an outpatient. Finally, we are giving you some medications to take at home that will help prevent another stroke in the future. You will take a combination of clopidogrel and aspirin for three weeks. After those three weeks, which end on 10-24, you will stop clopidogrel and continue to take the aspirin indefinitely. Please return to the ED if any of your symptoms, including dizziness or slurred speech, return.

## 2022-06-05 NOTE — Progress Notes (Signed)
  Echocardiogram 2D Echocardiogram has been performed.  Beth Macdonald 06/05/2022, 11:03 AM

## 2022-06-05 NOTE — Evaluation (Signed)
Speech Language Pathology Evaluation Patient Details Name: Beth Macdonald MRN: [id number] DOB: 1946/07/20 Today's Date: 06/05/2022 Time: 5170-0174 SLP Time Calculation (min) (ACUTE ONLY): 25 min  Problem List:  Patient Active Problem List   Diagnosis Date Noted   CVA (cerebral vascular accident) ([place]) 06/05/2022   HTN (hypertension) 06/04/2022   Osteoarthritis of right knee 01/01/2022   Bilateral hip pain 11/23/2021   Murmur 03/16/2020   H/O TIA (transient ischemic attack) and stroke 02/02/2019   Mixed hyperlipidemia 02/02/2019   TIA (transient ischemic attack) 10/29/2018   Dyslipidemia 10/29/2018   Type 2 diabetes mellitus with hyperlipidemia ([place]) 10/29/2018   Tendinopathy of hip 07/12/2016   OA (osteoarthritis) of knee 05/29/2016   Sleep disturbance 05/29/2016   Bilateral acromioclavicular joint arthritis 11/08/2015   Dizzy 07/21/2015   Rotator cuff syndrome of both shoulders 06/21/2015   Pain in joint, ankle and foot 10/20/2012   Bilateral leg weakness 10/20/2012   Past Medical History:  Past Medical History:  Diagnosis Date   Aortic atherosclerosis ([place])    Diabetes mellitus without complication ([place])    Type 2   Diabetic neuropathy ([place])    DVT of proximal lower limb ([place])    Facial paralysis/Bells palsy    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[facility])    Hyperlipidemia    Mitral valve insufficiency    Osteoarthritis    SCCA (squamous cell carcinoma) of skin 03/22/2021   Bridge of nose (in situ)   Skull fracture ([place])    age 43   Squamous cell carcinoma of skin 03/22/2021   Right forehead (in situ)   TIA (transient ischemic attack) 2020   Varicose vein of leg    Past Surgical History:  Past Surgical History:  Procedure Laterality Date   ELBOW SURGERY Bilateral    20 yrs ago   ENDOVENOUS ABLATION SAPHENOUS VEIN W/ LASER Bilateral    ENDOVENOUS ABLATION SAPHENOUS VEIN W/ LASER Left 11/19/2019   endovenous laser ablation anterior accessory branch of left GSV  and stab phlebectomy> 20 incisions left leg by Gae Gallop MD    shoulder Bilateral    [id number]   TOENAIL EXCISION Right 2019   Big toe   TONSILLECTOMY Bilateral    TOTAL KNEE ARTHROPLASTY Right 01/01/2022   Procedure: TOTAL KNEE ARTHROPLASTY;  Surgeon: Gaynelle Arabian, MD;  Location: [facility];  Service: Orthopedics;  Laterality: Right;   HPI:  Pt is a 76 yo female admitted with slurred speech, vertigo and sensation changes in L hand and mild weakness in L hand. CT-. 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   Assessment / Plan / Recommendation Clinical Impression  Patient currently presenting with a mild impairment of her speech articulation, suspected cognitive deficits and pragmatic language impairment. She was oriented x4 and able to recall and describe recent therapeutic and medical interventions and recall names of therapists. Speech was mildly dysarthric but 100% intelligible at conversational and oral reading levels. 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 Patient's voice was monotone during unstructured conversation but when reading a passage out loud, she did use some appropriate changes in intonation. Patient did not exhibit adequate self-monitor during conversation and she did not respond to non-verbal and even verbal cues from SLP and her spouse for topic change or to pause to allow for communication partner to speak.  SLP discussed with patient and spouse that [facility] SLP not recommended as patient appears to be at too high a level for benefit from this but that she may benefit from OP SLP services to work on her voice, speech and higher level cognitive and pragmatic language abilities.    SLP Assessment  SLP Recommendation/Assessment: All further Speech Lanaguage Pathology  needs can be addressed in the next venue of  care SLP Visit Diagnosis: Dysarthria and anarthria (R47.1);Cognitive communication deficit (R41.841)    Recommendations for follow up therapy are one component of a multi-disciplinary discharge planning process, led by the attending physician.  Recommendations may be updated based on patient status, additional functional criteria and insurance authorization.    Follow Up Recommendations  Outpatient SLP    Assistance Recommended at Discharge  Intermittent Supervision/Assistance  Functional Status Assessment Patient has had a recent decline in their functional status and demonstrates the ability to make significant improvements in function in a reasonable and predictable amount of time.  Frequency and Duration   N/A        SLP Evaluation Cognition  Overall Cognitive Status: Difficult to assess Arousal/Alertness: Awake/alert Orientation Level: Oriented X4 Year: 2023 Month: October Day of Week: Correct Attention: Alternating Alternating Attention: Impaired Alternating Attention Impairment: Verbal complex Memory: Appears intact Safety/Judgment: Appears intact       Comprehension  Auditory Comprehension Overall Auditory Comprehension: Appears within functional limits for tasks assessed    Expression Expression Primary Mode of Expression: Verbal Verbal Expression Overall Verbal Expression: Impaired Repetition: No impairment Naming: No impairment Pragmatics: Impairment Impairments: Abnormal affect;Monotone;Topic maintenance Interfering Components: Attention Non-Verbal Means of Communication: Not applicable   Oral / Motor  Oral Motor/Sensory Function Overall Oral Motor/Sensory Function: Other (comment) (h/o left sided facial and bilabial weakness and assymmetry secondary to bell's palsy) Motor Speech Overall Motor Speech: Impaired Resonance: Within functional limits Articulation: Impaired Level of Impairment: Conversation Intelligibility: Intelligible Motor Planning:  Witnin functional limits Motor Speech Errors: Not applicable Interfering Components: Premorbid status Effective Techniques: Slow rate;Over-articulate           Sonia Baller, MA, CCC-SLP Speech Therapy

## 2022-06-06 ENCOUNTER — Other Ambulatory Visit: Payer: Self-pay

## 2022-06-07 DIAGNOSIS — Z96651 Presence of right artificial knee joint: Secondary | ICD-10-CM | POA: Diagnosis not present

## 2022-06-07 DIAGNOSIS — M1712 Unilateral primary osteoarthritis, left knee: Secondary | ICD-10-CM | POA: Diagnosis not present

## 2022-06-07 DIAGNOSIS — I69322 Dysarthria following cerebral infarction: Secondary | ICD-10-CM | POA: Diagnosis not present

## 2022-06-07 DIAGNOSIS — Z7984 Long term (current) use of oral hypoglycemic drugs: Secondary | ICD-10-CM | POA: Diagnosis not present

## 2022-06-07 DIAGNOSIS — Z7982 Long term (current) use of aspirin: Secondary | ICD-10-CM | POA: Diagnosis not present

## 2022-06-07 DIAGNOSIS — I69398 Other sequelae of cerebral infarction: Secondary | ICD-10-CM | POA: Diagnosis not present

## 2022-06-07 DIAGNOSIS — H8110 Benign paroxysmal vertigo, unspecified ear: Secondary | ICD-10-CM | POA: Diagnosis not present

## 2022-06-07 DIAGNOSIS — E119 Type 2 diabetes mellitus without complications: Secondary | ICD-10-CM | POA: Diagnosis not present

## 2022-06-07 DIAGNOSIS — E785 Hyperlipidemia, unspecified: Secondary | ICD-10-CM | POA: Diagnosis not present

## 2022-06-07 DIAGNOSIS — Z7902 Long term (current) use of antithrombotics/antiplatelets: Secondary | ICD-10-CM | POA: Diagnosis not present

## 2022-06-07 DIAGNOSIS — Z9181 History of falling: Secondary | ICD-10-CM | POA: Diagnosis not present

## 2022-06-07 DIAGNOSIS — R29898 Other symptoms and signs involving the musculoskeletal system: Secondary | ICD-10-CM | POA: Diagnosis not present

## 2022-06-07 DIAGNOSIS — I10 Essential (primary) hypertension: Secondary | ICD-10-CM | POA: Diagnosis not present

## 2022-06-07 DIAGNOSIS — G510 Bell's palsy: Secondary | ICD-10-CM | POA: Diagnosis not present

## 2022-06-08 DIAGNOSIS — R29898 Other symptoms and signs involving the musculoskeletal system: Secondary | ICD-10-CM | POA: Diagnosis not present

## 2022-06-08 DIAGNOSIS — I639 Cerebral infarction, unspecified: Secondary | ICD-10-CM

## 2022-06-08 DIAGNOSIS — I10 Essential (primary) hypertension: Secondary | ICD-10-CM | POA: Diagnosis not present

## 2022-06-08 DIAGNOSIS — H8110 Benign paroxysmal vertigo, unspecified ear: Secondary | ICD-10-CM | POA: Diagnosis not present

## 2022-06-08 DIAGNOSIS — I69398 Other sequelae of cerebral infarction: Secondary | ICD-10-CM | POA: Diagnosis not present

## 2022-06-08 DIAGNOSIS — E119 Type 2 diabetes mellitus without complications: Secondary | ICD-10-CM | POA: Diagnosis not present

## 2022-06-08 DIAGNOSIS — I69322 Dysarthria following cerebral infarction: Secondary | ICD-10-CM | POA: Diagnosis not present

## 2022-06-11 DIAGNOSIS — Z23 Encounter for immunization: Secondary | ICD-10-CM | POA: Diagnosis not present

## 2022-06-11 DIAGNOSIS — Z8673 Personal history of transient ischemic attack (TIA), and cerebral infarction without residual deficits: Secondary | ICD-10-CM | POA: Diagnosis not present

## 2022-06-11 DIAGNOSIS — Z09 Encounter for follow-up examination after completed treatment for conditions other than malignant neoplasm: Secondary | ICD-10-CM | POA: Diagnosis not present

## 2022-06-12 DIAGNOSIS — I69398 Other sequelae of cerebral infarction: Secondary | ICD-10-CM | POA: Diagnosis not present

## 2022-06-12 DIAGNOSIS — E119 Type 2 diabetes mellitus without complications: Secondary | ICD-10-CM | POA: Diagnosis not present

## 2022-06-12 DIAGNOSIS — H8110 Benign paroxysmal vertigo, unspecified ear: Secondary | ICD-10-CM | POA: Diagnosis not present

## 2022-06-12 DIAGNOSIS — I10 Essential (primary) hypertension: Secondary | ICD-10-CM | POA: Diagnosis not present

## 2022-06-12 DIAGNOSIS — R29898 Other symptoms and signs involving the musculoskeletal system: Secondary | ICD-10-CM | POA: Diagnosis not present

## 2022-06-12 DIAGNOSIS — I69322 Dysarthria following cerebral infarction: Secondary | ICD-10-CM | POA: Diagnosis not present

## 2022-06-13 DIAGNOSIS — E119 Type 2 diabetes mellitus without complications: Secondary | ICD-10-CM | POA: Diagnosis not present

## 2022-06-13 DIAGNOSIS — H8110 Benign paroxysmal vertigo, unspecified ear: Secondary | ICD-10-CM | POA: Diagnosis not present

## 2022-06-13 DIAGNOSIS — R29898 Other symptoms and signs involving the musculoskeletal system: Secondary | ICD-10-CM | POA: Diagnosis not present

## 2022-06-13 DIAGNOSIS — I69398 Other sequelae of cerebral infarction: Secondary | ICD-10-CM | POA: Diagnosis not present

## 2022-06-13 DIAGNOSIS — I10 Essential (primary) hypertension: Secondary | ICD-10-CM | POA: Diagnosis not present

## 2022-06-13 DIAGNOSIS — I69322 Dysarthria following cerebral infarction: Secondary | ICD-10-CM | POA: Diagnosis not present

## 2022-06-15 DIAGNOSIS — I10 Essential (primary) hypertension: Secondary | ICD-10-CM | POA: Diagnosis not present

## 2022-06-15 DIAGNOSIS — R29898 Other symptoms and signs involving the musculoskeletal system: Secondary | ICD-10-CM | POA: Diagnosis not present

## 2022-06-15 DIAGNOSIS — M1711 Unilateral primary osteoarthritis, right knee: Secondary | ICD-10-CM | POA: Diagnosis not present

## 2022-06-15 DIAGNOSIS — Z8673 Personal history of transient ischemic attack (TIA), and cerebral infarction without residual deficits: Secondary | ICD-10-CM | POA: Diagnosis not present

## 2022-06-15 DIAGNOSIS — H8110 Benign paroxysmal vertigo, unspecified ear: Secondary | ICD-10-CM | POA: Diagnosis not present

## 2022-06-15 DIAGNOSIS — I69398 Other sequelae of cerebral infarction: Secondary | ICD-10-CM | POA: Diagnosis not present

## 2022-06-15 DIAGNOSIS — E119 Type 2 diabetes mellitus without complications: Secondary | ICD-10-CM | POA: Diagnosis not present

## 2022-06-15 DIAGNOSIS — E1169 Type 2 diabetes mellitus with other specified complication: Secondary | ICD-10-CM | POA: Diagnosis not present

## 2022-06-15 DIAGNOSIS — I69322 Dysarthria following cerebral infarction: Secondary | ICD-10-CM | POA: Diagnosis not present

## 2022-06-15 DIAGNOSIS — E782 Mixed hyperlipidemia: Secondary | ICD-10-CM | POA: Diagnosis not present

## 2022-06-19 DIAGNOSIS — I69398 Other sequelae of cerebral infarction: Secondary | ICD-10-CM | POA: Diagnosis not present

## 2022-06-19 DIAGNOSIS — R29898 Other symptoms and signs involving the musculoskeletal system: Secondary | ICD-10-CM | POA: Diagnosis not present

## 2022-06-19 DIAGNOSIS — E119 Type 2 diabetes mellitus without complications: Secondary | ICD-10-CM | POA: Diagnosis not present

## 2022-06-19 DIAGNOSIS — H8110 Benign paroxysmal vertigo, unspecified ear: Secondary | ICD-10-CM | POA: Diagnosis not present

## 2022-06-19 DIAGNOSIS — I69322 Dysarthria following cerebral infarction: Secondary | ICD-10-CM | POA: Diagnosis not present

## 2022-06-19 DIAGNOSIS — I10 Essential (primary) hypertension: Secondary | ICD-10-CM | POA: Diagnosis not present

## 2022-06-21 DIAGNOSIS — I10 Essential (primary) hypertension: Secondary | ICD-10-CM | POA: Diagnosis not present

## 2022-06-21 DIAGNOSIS — I69322 Dysarthria following cerebral infarction: Secondary | ICD-10-CM | POA: Diagnosis not present

## 2022-06-21 DIAGNOSIS — E119 Type 2 diabetes mellitus without complications: Secondary | ICD-10-CM | POA: Diagnosis not present

## 2022-06-21 DIAGNOSIS — I69398 Other sequelae of cerebral infarction: Secondary | ICD-10-CM | POA: Diagnosis not present

## 2022-06-21 DIAGNOSIS — H8110 Benign paroxysmal vertigo, unspecified ear: Secondary | ICD-10-CM | POA: Diagnosis not present

## 2022-06-21 DIAGNOSIS — R29898 Other symptoms and signs involving the musculoskeletal system: Secondary | ICD-10-CM | POA: Diagnosis not present

## 2022-06-23 ENCOUNTER — Other Ambulatory Visit: Payer: Self-pay | Admitting: Student

## 2022-06-26 DIAGNOSIS — I69398 Other sequelae of cerebral infarction: Secondary | ICD-10-CM | POA: Diagnosis not present

## 2022-06-26 DIAGNOSIS — I69322 Dysarthria following cerebral infarction: Secondary | ICD-10-CM | POA: Diagnosis not present

## 2022-06-26 DIAGNOSIS — H8110 Benign paroxysmal vertigo, unspecified ear: Secondary | ICD-10-CM | POA: Diagnosis not present

## 2022-06-26 DIAGNOSIS — I10 Essential (primary) hypertension: Secondary | ICD-10-CM | POA: Diagnosis not present

## 2022-06-26 DIAGNOSIS — E119 Type 2 diabetes mellitus without complications: Secondary | ICD-10-CM | POA: Diagnosis not present

## 2022-06-26 DIAGNOSIS — R29898 Other symptoms and signs involving the musculoskeletal system: Secondary | ICD-10-CM | POA: Diagnosis not present

## 2022-06-27 DIAGNOSIS — E119 Type 2 diabetes mellitus without complications: Secondary | ICD-10-CM | POA: Diagnosis not present

## 2022-06-27 DIAGNOSIS — I69322 Dysarthria following cerebral infarction: Secondary | ICD-10-CM | POA: Diagnosis not present

## 2022-06-27 DIAGNOSIS — I10 Essential (primary) hypertension: Secondary | ICD-10-CM | POA: Diagnosis not present

## 2022-06-27 DIAGNOSIS — I69398 Other sequelae of cerebral infarction: Secondary | ICD-10-CM | POA: Diagnosis not present

## 2022-06-27 DIAGNOSIS — R29898 Other symptoms and signs involving the musculoskeletal system: Secondary | ICD-10-CM | POA: Diagnosis not present

## 2022-06-27 DIAGNOSIS — H8110 Benign paroxysmal vertigo, unspecified ear: Secondary | ICD-10-CM | POA: Diagnosis not present

## 2022-06-28 DIAGNOSIS — I69322 Dysarthria following cerebral infarction: Secondary | ICD-10-CM | POA: Diagnosis not present

## 2022-06-28 DIAGNOSIS — H8110 Benign paroxysmal vertigo, unspecified ear: Secondary | ICD-10-CM | POA: Diagnosis not present

## 2022-06-28 DIAGNOSIS — R29898 Other symptoms and signs involving the musculoskeletal system: Secondary | ICD-10-CM | POA: Diagnosis not present

## 2022-06-28 DIAGNOSIS — I10 Essential (primary) hypertension: Secondary | ICD-10-CM | POA: Diagnosis not present

## 2022-06-28 DIAGNOSIS — E119 Type 2 diabetes mellitus without complications: Secondary | ICD-10-CM | POA: Diagnosis not present

## 2022-06-28 DIAGNOSIS — I69398 Other sequelae of cerebral infarction: Secondary | ICD-10-CM | POA: Diagnosis not present

## 2022-06-29 DIAGNOSIS — E119 Type 2 diabetes mellitus without complications: Secondary | ICD-10-CM | POA: Diagnosis not present

## 2022-06-29 DIAGNOSIS — I69398 Other sequelae of cerebral infarction: Secondary | ICD-10-CM | POA: Diagnosis not present

## 2022-06-29 DIAGNOSIS — H8110 Benign paroxysmal vertigo, unspecified ear: Secondary | ICD-10-CM | POA: Diagnosis not present

## 2022-06-29 DIAGNOSIS — R29898 Other symptoms and signs involving the musculoskeletal system: Secondary | ICD-10-CM | POA: Diagnosis not present

## 2022-06-29 DIAGNOSIS — I10 Essential (primary) hypertension: Secondary | ICD-10-CM | POA: Diagnosis not present

## 2022-06-29 DIAGNOSIS — I69322 Dysarthria following cerebral infarction: Secondary | ICD-10-CM | POA: Diagnosis not present

## 2022-07-03 DIAGNOSIS — I10 Essential (primary) hypertension: Secondary | ICD-10-CM | POA: Diagnosis not present

## 2022-07-03 DIAGNOSIS — I69322 Dysarthria following cerebral infarction: Secondary | ICD-10-CM | POA: Diagnosis not present

## 2022-07-03 DIAGNOSIS — H8110 Benign paroxysmal vertigo, unspecified ear: Secondary | ICD-10-CM | POA: Diagnosis not present

## 2022-07-03 DIAGNOSIS — I69398 Other sequelae of cerebral infarction: Secondary | ICD-10-CM | POA: Diagnosis not present

## 2022-07-03 DIAGNOSIS — E119 Type 2 diabetes mellitus without complications: Secondary | ICD-10-CM | POA: Diagnosis not present

## 2022-07-03 DIAGNOSIS — R29898 Other symptoms and signs involving the musculoskeletal system: Secondary | ICD-10-CM | POA: Diagnosis not present

## 2022-07-07 DIAGNOSIS — I69398 Other sequelae of cerebral infarction: Secondary | ICD-10-CM | POA: Diagnosis not present

## 2022-07-07 DIAGNOSIS — Z7982 Long term (current) use of aspirin: Secondary | ICD-10-CM | POA: Diagnosis not present

## 2022-07-07 DIAGNOSIS — Z7984 Long term (current) use of oral hypoglycemic drugs: Secondary | ICD-10-CM | POA: Diagnosis not present

## 2022-07-07 DIAGNOSIS — R29898 Other symptoms and signs involving the musculoskeletal system: Secondary | ICD-10-CM | POA: Diagnosis not present

## 2022-07-07 DIAGNOSIS — E785 Hyperlipidemia, unspecified: Secondary | ICD-10-CM | POA: Diagnosis not present

## 2022-07-07 DIAGNOSIS — I10 Essential (primary) hypertension: Secondary | ICD-10-CM | POA: Diagnosis not present

## 2022-07-07 DIAGNOSIS — I69322 Dysarthria following cerebral infarction: Secondary | ICD-10-CM | POA: Diagnosis not present

## 2022-07-07 DIAGNOSIS — E119 Type 2 diabetes mellitus without complications: Secondary | ICD-10-CM | POA: Diagnosis not present

## 2022-07-07 DIAGNOSIS — Z7902 Long term (current) use of antithrombotics/antiplatelets: Secondary | ICD-10-CM | POA: Diagnosis not present

## 2022-07-07 DIAGNOSIS — Z9181 History of falling: Secondary | ICD-10-CM | POA: Diagnosis not present

## 2022-07-07 DIAGNOSIS — G510 Bell's palsy: Secondary | ICD-10-CM | POA: Diagnosis not present

## 2022-07-07 DIAGNOSIS — M1712 Unilateral primary osteoarthritis, left knee: Secondary | ICD-10-CM | POA: Diagnosis not present

## 2022-07-07 DIAGNOSIS — Z96651 Presence of right artificial knee joint: Secondary | ICD-10-CM | POA: Diagnosis not present

## 2022-07-07 DIAGNOSIS — H8110 Benign paroxysmal vertigo, unspecified ear: Secondary | ICD-10-CM | POA: Diagnosis not present

## 2022-07-10 DIAGNOSIS — R600 Localized edema: Secondary | ICD-10-CM | POA: Diagnosis not present

## 2022-07-10 DIAGNOSIS — I10 Essential (primary) hypertension: Secondary | ICD-10-CM | POA: Diagnosis not present

## 2022-07-10 DIAGNOSIS — Z23 Encounter for immunization: Secondary | ICD-10-CM | POA: Diagnosis not present

## 2022-07-10 DIAGNOSIS — R197 Diarrhea, unspecified: Secondary | ICD-10-CM | POA: Diagnosis not present

## 2022-07-10 DIAGNOSIS — E782 Mixed hyperlipidemia: Secondary | ICD-10-CM | POA: Diagnosis not present

## 2022-07-10 DIAGNOSIS — Z8673 Personal history of transient ischemic attack (TIA), and cerebral infarction without residual deficits: Secondary | ICD-10-CM | POA: Diagnosis not present

## 2022-07-10 DIAGNOSIS — Z91012 Allergy to eggs: Secondary | ICD-10-CM | POA: Diagnosis not present

## 2022-07-11 DIAGNOSIS — R29898 Other symptoms and signs involving the musculoskeletal system: Secondary | ICD-10-CM | POA: Diagnosis not present

## 2022-07-11 DIAGNOSIS — H8110 Benign paroxysmal vertigo, unspecified ear: Secondary | ICD-10-CM | POA: Diagnosis not present

## 2022-07-11 DIAGNOSIS — E119 Type 2 diabetes mellitus without complications: Secondary | ICD-10-CM | POA: Diagnosis not present

## 2022-07-11 DIAGNOSIS — I69322 Dysarthria following cerebral infarction: Secondary | ICD-10-CM | POA: Diagnosis not present

## 2022-07-11 DIAGNOSIS — I10 Essential (primary) hypertension: Secondary | ICD-10-CM | POA: Diagnosis not present

## 2022-07-11 DIAGNOSIS — I69398 Other sequelae of cerebral infarction: Secondary | ICD-10-CM | POA: Diagnosis not present

## 2022-07-19 DIAGNOSIS — E119 Type 2 diabetes mellitus without complications: Secondary | ICD-10-CM | POA: Diagnosis not present

## 2022-07-19 DIAGNOSIS — R29898 Other symptoms and signs involving the musculoskeletal system: Secondary | ICD-10-CM | POA: Diagnosis not present

## 2022-07-19 DIAGNOSIS — H8110 Benign paroxysmal vertigo, unspecified ear: Secondary | ICD-10-CM | POA: Diagnosis not present

## 2022-07-19 DIAGNOSIS — I10 Essential (primary) hypertension: Secondary | ICD-10-CM | POA: Diagnosis not present

## 2022-07-19 DIAGNOSIS — I69398 Other sequelae of cerebral infarction: Secondary | ICD-10-CM | POA: Diagnosis not present

## 2022-07-19 DIAGNOSIS — I69322 Dysarthria following cerebral infarction: Secondary | ICD-10-CM | POA: Diagnosis not present

## 2022-07-24 DIAGNOSIS — I10 Essential (primary) hypertension: Secondary | ICD-10-CM | POA: Diagnosis not present

## 2022-07-24 DIAGNOSIS — I69322 Dysarthria following cerebral infarction: Secondary | ICD-10-CM | POA: Diagnosis not present

## 2022-07-24 DIAGNOSIS — R29898 Other symptoms and signs involving the musculoskeletal system: Secondary | ICD-10-CM | POA: Diagnosis not present

## 2022-07-24 DIAGNOSIS — I69398 Other sequelae of cerebral infarction: Secondary | ICD-10-CM | POA: Diagnosis not present

## 2022-07-30 ENCOUNTER — Inpatient Hospital Stay: Payer: MEDICARE | Admitting: Neurology

## 2022-08-01 DIAGNOSIS — N3281 Overactive bladder: Secondary | ICD-10-CM | POA: Diagnosis not present

## 2022-08-01 DIAGNOSIS — I69398 Other sequelae of cerebral infarction: Secondary | ICD-10-CM | POA: Diagnosis not present

## 2022-08-01 DIAGNOSIS — R29898 Other symptoms and signs involving the musculoskeletal system: Secondary | ICD-10-CM | POA: Diagnosis not present

## 2022-08-01 DIAGNOSIS — E119 Type 2 diabetes mellitus without complications: Secondary | ICD-10-CM | POA: Diagnosis not present

## 2022-08-01 DIAGNOSIS — I10 Essential (primary) hypertension: Secondary | ICD-10-CM | POA: Diagnosis not present

## 2022-08-01 DIAGNOSIS — H8110 Benign paroxysmal vertigo, unspecified ear: Secondary | ICD-10-CM | POA: Diagnosis not present

## 2022-08-01 DIAGNOSIS — R600 Localized edema: Secondary | ICD-10-CM | POA: Diagnosis not present

## 2022-08-01 DIAGNOSIS — I69322 Dysarthria following cerebral infarction: Secondary | ICD-10-CM | POA: Diagnosis not present

## 2022-08-28 ENCOUNTER — Inpatient Hospital Stay: Payer: MEDICARE | Admitting: Neurology

## 2022-09-10 ENCOUNTER — Telehealth: Payer: Self-pay | Admitting: Neurology

## 2022-09-10 ENCOUNTER — Ambulatory Visit (INDEPENDENT_AMBULATORY_CARE_PROVIDER_SITE_OTHER): Payer: MEDICARE | Admitting: Neurology

## 2022-09-10 ENCOUNTER — Encounter: Payer: Self-pay | Admitting: Neurology

## 2022-09-10 VITALS — BP 171/75 | HR 69 | Ht 68.0 in | Wt 179.4 lb

## 2022-09-10 DIAGNOSIS — I639 Cerebral infarction, unspecified: Secondary | ICD-10-CM

## 2022-09-10 DIAGNOSIS — I63521 Cerebral infarction due to unspecified occlusion or stenosis of right anterior cerebral artery: Secondary | ICD-10-CM

## 2022-09-10 NOTE — Patient Instructions (Addendum)
I had a long d/w patient about his recent stroke, risk for recurrent stroke/TIAs, personally independently reviewed imaging studies and stroke evaluation results and answered questions.Continue aspirin 81 mg daily  for secondary stroke prevention and maintain strict control of hypertension with blood pressure goal below 130/90, diabetes with hemoglobin A1c goal below 6.5% and lipids with LDL cholesterol goal below 70 mg/dL. I also advised the patient to eat a healthy diet with plenty of whole grains, cereals, fruits and vegetables, exercise regularly and maintain ideal body weight .refer for ambulatory loop recorder insertion for paroxysmal A-fib to cardiology.  Followup in the future with my nurse practitioner in 3 months or call earlier if necessary.  Stroke Prevention Some medical conditions and behaviors can lead to a higher chance of having a stroke. You can help prevent a stroke by eating healthy, exercising, not smoking, and managing any medical conditions you have. Stroke is a leading cause of functional impairment. Primary prevention is particularly important because a majority of strokes are first-time events. Stroke changes the lives of not only those who experience a stroke but also their family and other caregivers. How can this condition affect me? A stroke is a medical emergency and should be treated right away. A stroke can lead to brain damage and can sometimes be life-threatening. If a person gets medical treatment right away, there is a better chance of surviving and recovering from a stroke. What can increase my risk? The following medical conditions may increase your risk of a stroke: Cardiovascular disease. High blood pressure (hypertension). Diabetes. High cholesterol. Sickle cell disease. Blood clotting disorders (hypercoagulable state). Obesity. Sleep disorders (obstructive sleep apnea). Other risk factors include: Being older than age 67. Having a history of blood clots,  stroke, or mini-stroke (transient ischemic attack, TIA). Genetic factors, such as race, ethnicity, or a family history of stroke. Smoking cigarettes or using other tobacco products. Taking birth control pills, especially if you also use tobacco. Heavy use of alcohol or drugs, especially cocaine and methamphetamine. Physical inactivity. What actions can I take to prevent this? Manage your health conditions High cholesterol levels. Eating a healthy diet is important for preventing high cholesterol. If cholesterol cannot be managed through diet alone, you may need to take medicines. Take any prescribed medicines to control your cholesterol as told by your health care provider. Hypertension. To reduce your risk of stroke, try to keep your blood pressure below 130/80. Eating a healthy diet and exercising regularly are important for controlling blood pressure. If these steps are not enough to manage your blood pressure, you may need to take medicines. Take any prescribed medicines to control hypertension as told by your health care provider. Ask your health care provider if you should monitor your blood pressure at home. Have your blood pressure checked every year, even if your blood pressure is normal. Blood pressure increases with age and some medical conditions. Diabetes. Eating a healthy diet and exercising regularly are important parts of managing your blood sugar (glucose). If your blood sugar cannot be managed through diet and exercise, you may need to take medicines. Take any prescribed medicines to control your diabetes as told by your health care provider. Get evaluated for obstructive sleep apnea. Talk to your health care provider about getting a sleep evaluation if you snore a lot or have excessive sleepiness. Make sure that any other medical conditions you have, such as atrial fibrillation or atherosclerosis, are managed. Nutrition Follow instructions from your health care provider  about what  to eat or drink to help manage your health condition. These instructions may include: Reducing your daily calorie intake. Limiting how much salt (sodium) you use to 1,500 milligrams (mg) each day. Using only healthy fats for cooking, such as olive oil, canola oil, or sunflower oil. Eating healthy foods. You can do this by: Choosing foods that are high in fiber, such as whole grains, and fresh fruits and vegetables. Eating at least 5 servings of fruits and vegetables a day. Try to fill one-half of your plate with fruits and vegetables at each meal. Choosing lean protein foods, such as lean cuts of meat, poultry without skin, fish, tofu, beans, and nuts. Eating low-fat dairy products. Avoiding foods that are high in sodium. This can help lower blood pressure. Avoiding foods that have saturated fat, trans fat, and cholesterol. This can help prevent high cholesterol. Avoiding processed and prepared foods. Counting your daily carbohydrate intake.  Lifestyle If you drink alcohol: Limit how much you have to: 0-1 drink a day for women who are not pregnant. 0-2 drinks a day for men. Know how much alcohol is in your drink. In the [place] one drink equals one 12 oz bottle of beer (339m), one 5 oz glass of wine (1460m, or one 1 oz glass of hard liquor (4423m Do not use any products that contain nicotine or tobacco. These products include cigarettes, chewing tobacco, and vaping devices, such as e-cigarettes. If you need help quitting, ask your health care provider. Avoid secondhand smoke. Do not use drugs. Activity  Try to stay at a healthy weight. Get at least 30 minutes of exercise on most days, such as: Fast walking. Biking. Swimming. Medicines Take over-the-counter and prescription medicines only as told by your health care provider. Aspirin or blood thinners (antiplatelets or anticoagulants) may be recommended to reduce your risk of forming blood clots that can lead to  stroke. Avoid taking birth control pills. Talk to your health care provider about the risks of taking birth control pills if: You are over 35 3ars old. You smoke. You get very bad headaches. You have had a blood clot. Where to find more information American Stroke Association: www.strokeassociation.org Get help right away if: You or a loved one has any symptoms of a stroke. "BE FAST" is an easy way to remember the main warning signs of a stroke: B - Balance. Signs are dizziness, sudden trouble walking, or loss of balance. E - Eyes. Signs are trouble seeing or a sudden change in vision. F - Face. Signs are sudden weakness or numbness of the face, or the face or eyelid drooping on one side. A - Arms. Signs are weakness or numbness in an arm. This happens suddenly and usually on one side of the body. S - Speech. Signs are sudden trouble speaking, slurred speech, or trouble understanding what people say. T - Time. Time to call emergency services. Write down what time symptoms started. You or a loved one has other signs of a stroke, such as: A sudden, severe headache with no known cause. Nausea or vomiting. Seizure. These symptoms may represent a serious problem that is an emergency. Do not wait to see if the symptoms will go away. Get medical help right away. Call your local emergency services (911 in the [place] Do not drive yourself to the hospital. Summary You can help to prevent a stroke by eating healthy, exercising, not smoking, limiting alcohol intake, and managing any medical conditions you may have. Do not use any products  that contain nicotine or tobacco. These include cigarettes, chewing tobacco, and vaping devices, such as e-cigarettes. If you need help quitting, ask your health care provider. Remember "BE FAST" for warning signs of a stroke. Get help right away if you or a loved one has any of these signs. This information is not intended to replace advice given to you by your  health care provider. Make sure you discuss any questions you have with your health care provider. Document Revised: 03/21/2020 Document Reviewed: 03/21/2020 Elsevier Patient Education  [place].

## 2022-09-10 NOTE — Telephone Encounter (Signed)
Referral for Cardiology sent through [facility] to [facility] ([facility] [facility]). Phone: [phone]

## 2022-09-10 NOTE — Progress Notes (Signed)
[facility] [address] [place]. [place] [address] [phone]       OFFICE CONSULT NOTE  [name] Date of Birth:  [birth year] Medical Record Number:  [id number]   Referring MD: Janine Ores, NP  Reason for Referral: Stroke  HPI: Beth Macdonald is a pleasant 77 year old Caucasian lady seen today for office consultation visit for stroke.  She is accompanied by her wife.  History is obtained from them and review of electronic medical records and I personally reviewed pertinent available imaging: PACS.Beth Macdonald is a 77 y.o. female with a past medical history of TIA 10-2018, facial nerve palsy, vertigo, hypertension, and knee osteoarthritis who presents for slurred speech and dizziness.  She reports that she ate breakfast on 06/03/22 and then became dizzy after returning to her bedroom.  She described the dizziness as the room spinning with her staying still.  She believed that she was just having an episode of vertical and tried an Epley maneuver however the dizziness persisted which is abnormal for her.  She then noticed slurred speech, however denies word finding difficulties, headache, weakness, visual changes, swallowing difficulty, or recent illness. Her dizziness had resolved by the time she reached the emergency room.  She does have a history of Bell's palsy and does have a facial droop at baseline.  Her spouse denies any changes to this facial droop, but does notice the slurred speech persisting.Of note, she did have a knee replacement in May 2023 and has used a walker to ambulate since then.  MRI scan of the brain on 06/04/2022 showed acute subcortical infarct in the right anterior cerebral artery distribution with chronic blood products in the medial left temporal lobe small vessel disease.  Echocardiogram showed ejection fraction of 60 to 65%.  Hemoglobin A1c was 6.0.  LDL cholesterol 68 mg percent.  CT angiogram showed occlusion of the left PCA P1  segment and mild cardiothoracic process but both anterior cerebral arteries normal.  Patient was started on aspirin and Plavix for 3 weeks followed by aspirin alone and advised to undergo a 30-day heart monitor for paroxysmal A-fib but she missed 2 appointments) has not been done.  She has finished physical occupational therapy doing well.  She is able to ambulate with prefers to use a cane while walking outdoors.  Did have several falls in the first week after her stroke which is now getting better.  She is tolerating aspirin well without bruising or bleeding.  She remains on Repatha injections for her cholesterol which is well-controlled .she was recently started by primary care physician on Ozempic She does complain of feeling tired easily.  She plans to return back to work soon. Prior office visit 12/15/2018 : Beth Macdonald was initially scheduled today for in office hospital follow-up regarding TIA on 10/29/2018 but due to COVID-19 safety precautions, visit transition to telemedicine via WebEx. History obtained from patient and chart review. Reviewed all radiology images and labs personally.  [name] is a 77 y.o. 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 CT head reviewed and negative for acute infarct or abnormality. MRI brain reviewed which was negative for acute infarct or abnormality but did show evidence of small vessel disease. CTA head/neck showed posterior circulation atherosclerosis L>R, L P2 occlusion, mod to severe R P2, RLL pulm nodule and borderline R hilar lymphadenopathy along with aortic atherosclerosis and beading L>R ICA. 2D echo showed  EF of 60-65% without cardiac source of embolus identified. Symptoms likely related to left brian TIA. Recommended DAPT x3 weeks then aspirin alone along with continuation of atorvastatin '20mg'$ . HTN and DM stable with A1c 6.6. Other stroke risk factors include advanced age  but no prior hx of stroke/TIA. Recommended outpatient 30 day cardiac monitor to assess for AF as possible cause of TIA.  As all symptoms resolved, she was discharged home in stable condition without therapy needs.              She reports she has been doing well since hospital discharge without recurring or new stroke/TIA symptoms.  She has completed 3 weeks DAPT and continues on aspirin alone without side effects of bleeding or bruising.  Recent lipid panel by PCP showing LDL 102 glycerides 207 therefore increased atorvastatin from 20 mg to 40 mg daily along with initiating Zetia and plans on repeating levels in 02/2019.  Blood pressure has been on the lower side with SBP 100-120 but mainly low 100s.  Amlodipine dosage increase by PCP initially after hospital discharge due to continued elevated BP readings but due to side effect of BLE edema and improvement of BP, dosage decreased and has since been discontinued approximately 1 week ago.  Reports sleeping well at night, only occasional daytime drowsiness and denies snoring at night.  Currently wearing cardiac monitor which apparently was ordered for 14 days and will be completed in 1 week's time to assess for potential atrial fibrillation.  History of concussions in the past but denies stroke.  She does endorse occasional dizzy spells which has been ongoing for numerous years and endorses improvement as of recent with less frequent spells.  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  No further concerns at this time.   ROS:   14 system review of systems is positive for facial weakness, knee pain, imbalance, frequent falls, dizziness, vertigo all other systems negative  PMH:  Past Medical History:  Diagnosis Date   Aortic atherosclerosis ([place])    Diabetes mellitus without complication ([place])    Type 2   Diabetic neuropathy ([place])    DVT of proximal lower limb (HCC)    Facial paralysis/Bells palsy    Fibromuscular hyperplasia of artery  (HCC)    Hyperlipidemia    Mitral valve insufficiency    Osteoarthritis    SCCA (squamous cell carcinoma) of skin 03/22/2021   Bridge of nose (in situ)   Skull fracture ([place])    age 9   Squamous cell carcinoma of skin 03/22/2021   Right forehead (in situ)   TIA (transient ischemic attack) 2020   Varicose vein of leg     Social History:  Social History   Socioeconomic History   Marital status: Married    Spouse name: Not on file   Number of children: 0   Years of education: Not on file   Highest education level: Not on file  Occupational History   Not on file  Tobacco Use   Smoking status: Former    Packs/day: 1.00    Years: 20.00    Total pack years: 20.00    Types: Cigarettes    Quit date: 1980    Years since quitting: 44.0   Smokeless tobacco: Never  Vaping Use   Vaping Use: Never used  Substance and Sexual Activity   Alcohol use: Yes    Comment: social/rarely   Drug use: Never   Sexual activity: Yes  Other Topics Concern  Not on file  Social History Narrative   Not on file   Social Determinants of Health   Financial Resource Strain: Not on file  Food Insecurity: Not on file  Transportation Needs: Not on file  Physical Activity: Not on file  Stress: Not on file  Social Connections: Not on file  Intimate Partner Violence: Not on file    Medications:   Current Outpatient Medications on File Prior to Visit  Medication Sig Dispense Refill   aspirin EC 81 MG tablet Take 1 tablet (81 mg total) by mouth daily. Swallow whole. 30 tablet 12   Calcium Carbonate Antacid (TUMS PO) Take 1 tablet by mouth daily as needed (acid control).     clobetasol cream (TEMOVATE) 7.16 % Apply 1 Application topically 2 (two) times daily. 60 g 10   Clobetasol Prop Emollient Base (CLOBETASOL PROPIONATE E) 0.05 % emollient cream Apply 1 Application topically 2 (two) times daily. 60 g 10   cycloSPORINE (RESTASIS) 0.05 % ophthalmic emulsion Place 1 drop into both eyes 2 (two) times  daily.     desonide (DESOWEN) 0.05 % cream Apply topically 2 (two) times daily as needed (Rash). 60 g 11   Fluorouracil (TOLAK) 4 % CREA Apply 1 application  topically at bedtime. X 2 weeks. 40 g 6   ibuprofen (ADVIL) 800 MG tablet Take 1 tablet (800 mg total) by mouth every 6 (six) hours as needed for mild pain or moderate pain. 30 tablet 0   lisinopril (ZESTRIL) 5 MG tablet Take 5 mg by mouth daily.     metFORMIN (GLUCOPHAGE) 500 MG tablet Take 1,000 mg by mouth 2 (two) times daily with a meal.     ondansetron (ZOFRAN) 4 MG tablet Take 1 tablet (4 mg total) by mouth every 6 (six) hours as needed for nausea. 20 tablet 0   Polyethyl Glycol-Propyl Glycol (SYSTANE FREE OP) Apply 1 drop to eye daily as needed (dryness/irritation).     REPATHA SURECLICK 967 MG/ML SOAJ Inject 140 mg into the skin every 14 (fourteen) days. Sunday     Semaglutide (RYBELSUS) 7 MG TABS Take 7 mg by mouth daily.     No current facility-administered medications on file prior to visit.    Allergies:   Allergies  Allergen Reactions   Clindamycin/Lincomycin Other (See Comments)    Cannot tolerate mycins causes C-Diff   Eggs Or Egg-Derived Products Diarrhea   Milk-Related Compounds Diarrhea   Tetracyclines & Related Other (See Comments)    "Feel bad"    Physical Exam General: well developed, well nourished pleasant elderly Caucasian lady seated, in no evident distress Head: head normocephalic and atraumatic.   Neck: supple with no carotid or supraclavicular bruits Cardiovascular: regular rate and rhythm, no murmurs Musculoskeletal: no deformity Skin:  no rash/petichiae Vascular:  Normal pulses all extremities  Neurologic Exam Mental Status: Awake and fully alert. Oriented to place and time. Recent and remote memory intact. Attention span, concentration and fund of knowledge appropriate. Mood and affect appropriate.  Cranial Nerves: Fundoscopic exam reveals sharp disc margins. Pupils equal, briskly reactive to  light. Extraocular movements full without nystagmus. Visual fields full to confrontation. Hearing intact. Facial sensation intact.  Mild left low motor neuron facial weakness., tongue, palate moves normally and symmetrically.  Motor: Normal bulk and tone. Normal strength in all tested extremity muscles. Sensory.: intact to touch , pinprick , position and vibratory sensation.  Coordination: Rapid alternating movements normal in all extremities. Finger-to-nose and heel-to-shin performed accurately bilaterally. Gait and  Station: Arises from chair without difficulty. Stance is normal. Gait demonstrates normal stride length and balance . Able to heel, toe and tandem walk with moderate difficulty.  Reflexes: 1+ and symmetric. Toes downgoing.   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  Prior history of left hemispheric TIA in February 2020.  Vascular risk factors of diabetes, hypertension, hyperlipidemia and TIA.     PLAN:I had a long d/w patient about his recent stroke, risk for recurrent stroke/TIAs, personally independently reviewed imaging studies and stroke evaluation results and answered questions.Continue aspirin 81 mg daily  for secondary stroke prevention and maintain strict control of hypertension with blood pressure goal below 130/90, diabetes with hemoglobin A1c goal below 6.5% and lipids with LDL cholesterol goal below 70 mg/dL. I also advised the patient to eat a healthy diet with plenty of whole grains, cereals, fruits and vegetables, exercise regularly and maintain ideal body weight .refer for ambulatory loop recorder insertion for paroxysmal A-fib to cardiology.  Followup in the future with my nurse practitioner in 3 months or call earlier if necessary.  Greater than 50% time during this 45-minute consultation was little spent on counseling  and coordination of care about her cryptogenic stroke and TIA and discussion about evaluation.  Antony Contras, MD Note: This document was prepared with digital dictation and possible smart phrase technology. Any transcriptional errors that result from this process are unintentional.

## 2022-09-14 DIAGNOSIS — Z96651 Presence of right artificial knee joint: Secondary | ICD-10-CM | POA: Diagnosis not present

## 2022-09-24 NOTE — Progress Notes (Deleted)
CARDIOLOGY CONSULT NOTE       Patient ID: Beth Macdonald MRN: [id number] DOB/AGE: 01-06-1946 77 y.o.  Admit date: (Not on file) Referring Physician: Leonie Man Primary Physician: [name], MD Primary Cardiologist: New Reason for Consultation: Cryptogenic Stroke  Active Problems:   * No active hospital problems. *   HPI:  77 y.o. referred by Dr Leonie Man for cryptogenic stroke CRF;s HTN, HLD and DM History of TIA February 2020. History of murmur Seen by Dr Virgina Jock 03/16/20 with [facility] Hospitalized 06/03/22 with dizziness and slurred speech She does have prior history of Bell's palsy and vertigo. MRI 06/04/22 with acute subcortical right cerebral infarct TTE normal EF 60-65% with negative bubble. LDL 68 A1c 6.  No carotid stenosis by CTA  Rx with ASA/Plavix for 3 weeks She has missed appointments and never had 30 day monitor Referred to [place] to consider ambulatory loop recorder   ***  ROS All other systems reviewed and negative except as noted above  Past Medical History:  Diagnosis Date   Aortic atherosclerosis ([place])    Diabetes mellitus without complication ([place])    Type 2   Diabetic neuropathy ([place])    DVT of proximal lower limb (H[facility])    Facial paralysis/Bells palsy    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H[facility])    Hyperlipidemia    Mitral valve insufficiency    Osteoarthritis    SCCA (squamous cell carcinoma) of skin 03/22/2021   Bridge of nose (in situ)   Skull fracture ([place])    age 77   Squamous cell carcinoma of skin 03/22/2021   Right forehead (in situ)   TIA (transient ischemic attack) 2020   Varicose vein of leg     Family History  Problem Relation Age of Onset   Stroke Mother    Hypertension Father    Dementia Father    Leukemia Brother    Stroke Brother    Colon cancer Neg Hx    Esophageal cancer Neg Hx    Pancreatic cancer Neg Hx    Stomach cancer Neg Hx     Social History   Socioeconomic History   Marital status: Married    Spouse  name: Not on file   Number of children: 0   Years of education: Not on file   Highest education level: Not on file  Occupational History   Not on file  Tobacco Use   Smoking status: Former    Packs/day: 1.00    Years: 20.00    Total pack years: 20.00    Types: Cigarettes    Quit date: 1980    Years since quitting: 44.0   Smokeless tobacco: Never  Vaping Use   Vaping Use: Never used  Substance and Sexual Activity   Alcohol use: Yes    Comment: social/rarely   Drug use: Never   Sexual activity: Yes  Other Topics Concern   Not on file  Social History Narrative   Not on file   Social Determinants of Health   Financial Resource Strain: Not on file  Food Insecurity: Not on file  Transportation Needs: Not on file  Physical Activity: Not on file  Stress: Not on file  Social Connections: Not on file  Intimate Partner Violence: Not on file    Past Surgical History:  Procedure Laterality Date   ELBOW SURGERY Bilateral    20 yrs ago   ENDOVENOUS ABLATION SAPHENOUS VEIN W/ LASER Bilateral    ENDOVENOUS ABLATION SAPHENOUS VEIN W/ LASER Left 11/19/2019  endovenous laser ablation anterior accessory branch of left GSV and stab phlebectomy> 20 incisions left leg by Gae Gallop MD    shoulder Bilateral    [phone]   TOENAIL EXCISION Right 2019   Big toe   TONSILLECTOMY Bilateral    TOTAL KNEE ARTHROPLASTY Right 01/01/2022   Procedure: TOTAL KNEE ARTHROPLASTY;  Surgeon: Gaynelle Arabian, MD;  Location: [facility];  Service: Orthopedics;  Laterality: Right;      Current Outpatient Medications:    aspirin EC 81 MG tablet, Take 1 tablet (81 mg total) by mouth daily. Swallow whole., Disp: 30 tablet, Rfl: 12   Calcium Carbonate Antacid (TUMS PO), Take 1 tablet by mouth daily as needed (acid control)., Disp: , Rfl:    clobetasol cream (TEMOVATE) [id number] %, Apply 1 Application topically 2 (two) times daily., Disp: 60 g, Rfl: 10   Clobetasol Prop Emollient Base (CLOBETASOL PROPIONATE E) 0.05 %  emollient cream, Apply 1 Application topically 2 (two) times daily., Disp: 60 g, Rfl: 10   cycloSPORINE (RESTASIS) 0.05 % ophthalmic emulsion, Place 1 drop into both eyes 2 (two) times daily., Disp: , Rfl:    desonide (DESOWEN) 0.05 % cream, Apply topically 2 (two) times daily as needed (Rash)., Disp: 60 g, Rfl: 11   Fluorouracil (TOLAK) 4 % CREA, Apply 1 application  topically at bedtime. X 2 weeks., Disp: 40 g, Rfl: 6   ibuprofen (ADVIL) 800 MG tablet, Take 1 tablet (800 mg total) by mouth every 6 (six) hours as needed for mild pain or moderate pain., Disp: 30 tablet, Rfl: 0   lisinopril (ZESTRIL) 5 MG tablet, Take 5 mg by mouth daily., Disp: , Rfl:    metFORMIN (GLUCOPHAGE) 500 MG tablet, Take 1,000 mg by mouth 2 (two) times daily with a meal., Disp: , Rfl:    ondansetron (ZOFRAN) 4 MG tablet, Take 1 tablet (4 mg total) by mouth every 6 (six) hours as needed for nausea., Disp: 20 tablet, Rfl: 0   Polyethyl Glycol-Propyl Glycol (SYSTANE FREE OP), Apply 1 drop to eye daily as needed (dryness/irritation)., Disp: , Rfl:    REPATHA SURECLICK [id number] MG/ML SOAJ, Inject 140 mg into the skin every 14 (fourteen) days. Sunday, Disp: , Rfl:    Semaglutide (RYBELSUS) 7 MG TABS, Take 7 mg by mouth daily., Disp: , Rfl:     Physical Exam: There were no vitals taken for this visit.    Affect appropriate Chronically ill female  HEENT: normal Neck supple with no adenopathy JVP normal no bruits no thyromegaly Lungs clear with no wheezing and good diaphragmatic motion Heart:  S1/S2 no murmur, no rub, gallop or click PMI normal Abdomen: benighn, BS positve, no tenderness, no AAA no bruit.  No HSM or HJR Distal pulses intact with no bruits No edema Neuro non-focal Skin warm and dry No muscular weakness   Labs:   Lab Results  Component Value Date   WBC 7.6 06/05/2022   HGB 12.2 06/05/2022   HCT 36.4 06/05/2022   MCV 91.0 06/05/2022   PLT 246 06/05/2022   No results for input(s): "NA", "K", "CL",  "CO2", "BUN", "CREATININE", "CALCIUM", "PROT", "BILITOT", "ALKPHOS", "ALT", "AST", "GLUCOSE" in the last 168 hours.  Invalid input(s): "LABALBU" No results found for: "CKTOTAL", "CKMB", "CKMBINDEX", "TROPONINI"  Lab Results  Component Value Date   CHOL 172 06/05/2022   CHOL 198 10/30/2018   Lab Results  Component Value Date   HDL 71 06/05/2022   HDL 57 10/30/2018   Lab Results  Component Value  Date   LDLCALC 68 06/05/2022   LDLCALC 100 (H) 10/30/2018   Lab Results  Component Value Date   TRIG 165 (H) 06/05/2022   TRIG 207 (H) 10/30/2018   Lab Results  Component Value Date   CHOLHDL 2.4 06/05/2022   CHOLHDL 3.5 10/30/2018   No results found for: "LDLDIRECT"    Radiology: No results found.  EKG: SR rate 77 poor R wave progression 06/05/22    ASSESSMENT AND PLAN:   Cryptogenic STroke:  agree with utility ILR have referred to EP for implant Completed ASA/plavix. TTE negative bubble No carotid dx.  HTN:  Well controlled.  Continue current medications and low sodium Dash type diet.   HLD:  on Repatha LDL at goal  DM:  Discussed low carb diet.  Target hemoglobin A1c is 6.5 or less.  Continue current medications.  Refer to EP for ILR  No need for general cardiology f/u   Signed: [place] 09/24/2022, 9:37 AM

## 2022-10-03 ENCOUNTER — Ambulatory Visit: Payer: MEDICARE | Admitting: Cardiovascular Disease

## 2022-10-11 DIAGNOSIS — E782 Mixed hyperlipidemia: Secondary | ICD-10-CM | POA: Diagnosis not present

## 2022-10-11 DIAGNOSIS — M791 Myalgia, unspecified site: Secondary | ICD-10-CM | POA: Diagnosis not present

## 2022-10-11 DIAGNOSIS — Z8673 Personal history of transient ischemic attack (TIA), and cerebral infarction without residual deficits: Secondary | ICD-10-CM | POA: Diagnosis not present

## 2022-10-11 DIAGNOSIS — I10 Essential (primary) hypertension: Secondary | ICD-10-CM | POA: Diagnosis not present

## 2022-10-11 DIAGNOSIS — T466X5A Adverse effect of antihyperlipidemic and antiarteriosclerotic drugs, initial encounter: Secondary | ICD-10-CM | POA: Diagnosis not present

## 2022-10-11 DIAGNOSIS — E1165 Type 2 diabetes mellitus with hyperglycemia: Secondary | ICD-10-CM | POA: Diagnosis not present

## 2022-10-15 ENCOUNTER — Encounter: Payer: Self-pay | Admitting: Cardiology

## 2022-10-15 ENCOUNTER — Ambulatory Visit: Payer: MEDICARE | Attending: Cardiovascular Disease | Admitting: Cardiology

## 2022-10-15 VITALS — BP 156/88 | HR 76 | Ht 68.0 in | Wt 179.0 lb

## 2022-10-15 DIAGNOSIS — I639 Cerebral infarction, unspecified: Secondary | ICD-10-CM

## 2022-10-15 NOTE — Progress Notes (Signed)
Electrophysiology Office Note   Date:  10/15/2022   ID:  Beth Macdonald, DOB 07-28-1946, MRN [id number]  PCP:  Beth Pretty, MD  Cardiologist:   Primary Electrophysiologist:  Beth Lisby Meredith Leeds, MD    Chief Complaint: CVA   History of Present Illness: Beth Macdonald is a 77 y.o. female who is being seen today for the evaluation of CVA at the request of Beth Fila, MD. Presenting today for electrophysiology evaluation.  She has a history significant for type 2 diabetes with diabetic neuropathy, fibromuscular hyperplasia, hyperlipidemia.  She was eating breakfast 06/03/2022 and became dizzy.  She felt that she was having vertigo and tried an Epley maneuver but the dizziness persisted.  She noticed slurred speech.  This had resolved by the time she reached the emergency room.  She has a history of Bell's palsy and has facial droop at baseline.  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  Today, she denies symptoms of palpitations, chest pain, shortness of breath, orthopnea, PND, lower extremity edema, claudication, presyncope, syncope, bleeding, or neurologic sequela. The patient is tolerating medications without difficulties.  She continues to have dizziness.  She has no chest pain or shortness of breath.  She Beth Wiemers do all of her daily activities without restriction.  Despite that, she has to walk with a cane.  She also notes that her voice is higher in pitch.   Past Medical History:  Diagnosis Date   Aortic atherosclerosis ([place])    Diabetes mellitus without complication ([place])    Type 2   Diabetic neuropathy ([place])    DVT of proximal lower limb ([place])    Facial paralysis/Bells palsy    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[place])    Hyperlipidemia    Mitral valve insufficiency    Osteoarthritis    SCCA (squamous cell carcinoma) of skin 03/22/2021   Bridge of nose (in situ)   Skull fracture ([place])    age 43   Squamous cell carcinoma of skin  03/22/2021   Right forehead (in situ)   TIA (transient ischemic attack) 2020   Varicose vein of leg    Past Surgical History:  Procedure Laterality Date   ELBOW SURGERY Bilateral    20 yrs ago   ENDOVENOUS ABLATION SAPHENOUS VEIN W/ LASER Bilateral    ENDOVENOUS ABLATION SAPHENOUS VEIN W/ LASER Left 11/19/2019   endovenous laser ablation anterior accessory branch of left GSV and stab phlebectomy> 20 incisions left leg by Beth Gallop MD    shoulder Bilateral    [phone]   TOENAIL EXCISION Right 2019   Big toe   TONSILLECTOMY Bilateral    TOTAL KNEE ARTHROPLASTY Right 01/01/2022   Procedure: TOTAL KNEE ARTHROPLASTY;  Surgeon: Beth Arabian, MD;  Location: [facility];  Service: Orthopedics;  Laterality: Right;     Current Outpatient Medications  Medication Sig Dispense Refill   aspirin EC 81 MG tablet Take 1 tablet (81 mg total) by mouth daily. Swallow whole. 30 tablet 12   atorvastatin (LIPITOR) 20 MG tablet Take 20 mg by mouth daily.     Calcium Carbonate Antacid (TUMS PO) Take 1 tablet by mouth daily as needed (acid control).     clobetasol cream (TEMOVATE) [id number] % Apply 1 Application topically 2 (two) times daily. 60 g 10   Clobetasol Prop Emollient Base (CLOBETASOL PROPIONATE E) 0.05 % emollient cream Apply 1 Application topically 2 (two) times daily. 60 g 10   cycloSPORINE (RESTASIS) 0.05 % ophthalmic emulsion  Place 1 drop into both eyes 2 (two) times daily.     desonide (DESOWEN) 0.05 % cream Apply topically 2 (two) times daily as needed (Rash). 60 g 11   Fluorouracil (TOLAK) 4 % CREA Apply 1 application  topically at bedtime. X 2 weeks. 40 g 6   ibuprofen (ADVIL) 800 MG tablet Take 1 tablet (800 mg total) by mouth every 6 (six) hours as needed for mild pain or moderate pain. 30 tablet 0   lisinopril (ZESTRIL) 5 MG tablet Take 5 mg by mouth daily.     metFORMIN (GLUCOPHAGE) 500 MG tablet Take 1,000 mg by mouth 2 (two) times daily with a meal.     ondansetron (ZOFRAN) 4 MG tablet  Take 1 tablet (4 mg total) by mouth every 6 (six) hours as needed for nausea. 20 tablet 0   Polyethyl Glycol-Propyl Glycol (SYSTANE FREE OP) Apply 1 drop to eye daily as needed (dryness/irritation).     REPATHA SURECLICK [id number] MG/ML SOAJ Inject 140 mg into the skin every 14 (fourteen) days. Sunday     Semaglutide (RYBELSUS) 7 MG TABS Take 7 mg by mouth daily.     No current facility-administered medications for this visit.    Allergies:   Clindamycin/lincomycin, Eggs or egg-derived products, Milk-related compounds, and Tetracyclines & related   Social History:  The patient  reports that she quit smoking about 44 years ago. Her smoking use included cigarettes. She has a 20.00 pack-year smoking history. She has never used smokeless tobacco. She reports current alcohol use. She reports that she does not use drugs.   Family History:  The patient's family history includes Dementia in her father; Hypertension in her father; Leukemia in her brother; Stroke in her brother and mother.    ROS:  Please see the history of present illness.   Otherwise, review of systems is positive for none.   All other systems are reviewed and negative.    PHYSICAL EXAM: VS:  BP (!) 156/88   Pulse 76   Ht 5' 8"$  (1.727 m)   Wt 179 lb (81.2 kg)   SpO2 98%   BMI 27.22 kg/m  , BMI Body mass index is 27.22 kg/m. GEN: Well nourished, well developed, in no acute distress  HEENT: normal  Neck: no JVD, carotid bruits, or masses Cardiac: RRR; no murmurs, rubs, or gallops,no edema  Respiratory:  clear to auscultation bilaterally, normal work of breathing GI: soft, nontender, nondistended, + BS MS: no deformity or atrophy  Skin: warm and dry Neuro:  Strength and sensation are intact Psych: euthymic mood, full affect  EKG:  EKG is not ordered today. Personal review of the ekg ordered 06/05/22 shows sinus rhythm  Recent Labs: 06/04/2022: TSH 2.406 06/05/2022: BUN 26; Creatinine, Ser 1.05; Hemoglobin 12.2; Platelets 246;  Potassium 3.8; Sodium 140    Lipid Panel     Component Value Date/Time   CHOL 172 06/05/2022 0426   TRIG 165 (H) 06/05/2022 0426   HDL 71 06/05/2022 0426   CHOLHDL 2.4 06/05/2022 0426   VLDL 33 06/05/2022 0426   LDLCALC 68 06/05/2022 0426     Wt Readings from Last 3 Encounters:  10/15/22 179 lb (81.2 kg)  09/10/22 179 lb 6 oz (81.4 kg)  06/04/22 175 lb (79.4 kg)      Other studies Reviewed: Additional studies/ records that were reviewed today include: TTE 06/05/22  Review of the above records today demonstrates:   1. Left ventricular ejection fraction, by estimation, is 60 to  65%. The  left ventricle has normal function. The left ventricle has no regional  wall motion abnormalities. Left ventricular diastolic parameters are  consistent with Grade I diastolic  dysfunction (impaired relaxation).   2. Right ventricular systolic function is normal. The right ventricular  size is normal. Tricuspid regurgitation signal is inadequate for assessing  PA pressure.   3. The mitral valve is normal in structure. No evidence of mitral valve  regurgitation. No evidence of mitral stenosis.   4. The aortic valve is normal in structure. There is mild calcification  of the aortic valve. There is moderate thickening of the aortic valve.  Aortic valve regurgitation is not visualized. Aortic valve  sclerosis/calcification is present, without any  evidence of aortic stenosis.   5. The inferior vena cava is normal in size with greater than 50%  respiratory variability, suggesting right atrial pressure of 3 mmHg.   6. Agitated saline contrast bubble study was negative, with no evidence  of any interatrial shunt.    ASSESSMENT AND PLAN:  1.  Cryptogenic stroke: Thus far no causes been found.  She has had an echo without major abnormality.  No evidence of atrial fibrillation on her ECGs.  Would benefit from further monitoring.  ILR was discussed.  Risk and benefits including bleeding and  infection were discussed with the patient.  She understands the risks and is agreed to the procedure.    Current medicines are reviewed at length with the patient today.   The patient does not have concerns regarding her medicines.  The following changes were made today:  none  Labs/ tests ordered today include:  No orders of the defined types were placed in this encounter.    Disposition:   FU with Talesha Ellithorpe pending ILR results  Signed, Dalisa Forrer Meredith Leeds, MD  10/15/2022 9:48 AM     [facility] [address] [place] [address] [phone] (office) [phone] (fax)   SURGEON:  Arkeem Harts Meredith Leeds, MD     PREPROCEDURE DIAGNOSIS:  Cryptogenic stroke    POSTPROCEDURE DIAGNOSIS: Cryptogenic stroke     PROCEDURES:   1. Implantable loop recorder implantation    INTRODUCTION:  Jamaya Bettenhausen Hsiung presents with a history of cryptogenic stroke The costs of loop recorder monitoring have been discussed with the patient. Appropriate time out was performed prior to the procedure.    DESCRIPTION OF PROCEDURE:  Informed written consent was obtained.  The patient required no sedation for the procedure today.  Mapping over the patient's chest was performed to identify the area where electrograms were most prominent for ILR recording.  This area was found to be the left parasternal region over the 4th intercostal space. The patients left chest was therefore prepped and draped in the usual sterile fashion. The skin overlying the left parasternal region was infiltrated with lidocaine for local analgesia.  A 0.5-cm incision was made over the left parasternal region over the 3rd intercostal space.  A subcutaneous ILR pocket was fashioned using a combination of sharp and blunt dissection.  A Med[employer]onic Reveal LINQ (serial # [id number]) implantable loop recorder was then placed into the pocket  R waves were very prominent and measured 0.51m.  Steri- Strips and a sterile  dressing were then applied.  There were no early apparent complications.     CONCLUSIONS:   1. Successful implantation of a implantable loop recorder for a history of cryptogenic stroke  2. No early apparent complications.   Winnell Bento MMeredith Leeds MD  10/15/2022 9:48 AM

## 2022-10-15 NOTE — Patient Instructions (Signed)
Medication Instructions:  Your physician recommends that you continue on your current medications as directed. Please refer to the Current Medication list given to you today.  Labwork: None ordered.  Testing/Procedures: None ordered.  Follow-Up:  Your physician wants you to follow-up as needed with Dr. Curt Bears    Implantable Loop Recorder Placement, Care After This sheet gives you information about how to care for yourself after your procedure. Your health care provider may also give you more specific instructions. If you have problems or questions, contact your health care provider. What can I expect after the procedure? After the procedure, it is common to have: Soreness or discomfort near the incision. Some swelling or bruising near the incision.  Follow these instructions at home: Incision care  Monitor your cardiac device site for redness, swelling, and drainage. Call the device clinic at [phone] if you experience these symptoms or fever/chills.  Keep the large square bandage on your site for 24 hours and then you may remove it yourself. Keep the steri-strips underneath in place.   You may shower after 72 hours / 3 days from your procedure with the steri-strips in place. They will usually fall off on their own, or may be removed after 10 days. Pat dry.   Avoid lotions, ointments, or perfumes over your incision until it is well-healed.  Please do not submerge in water until your site is completely healed.   Your device is MRI compatible.   Remote monitoring is used to monitor your cardiac device from home. This monitoring is scheduled every month by our office. It allows [place] to keep an eye on the function of your device to ensure it is working properly.  If your wound site starts to bleed apply pressure.    For help with the monitor please call Med[employer] Monitor Support Specialist directly at [phone].    If you have any questions/concerns please call the device  clinic at [phone].  Activity  Return to your normal activities.  General instructions Follow instructions from your health care provider about how to manage your implantable loop recorder and transmit the information. Learn how to activate a recording if this is necessary for your type of device. You may go through a metal detection gate, and you may let someone hold a metal detector over your chest. Show your ID card if needed. Do not have an MRI unless you check with your health care provider first. Take over-the-counter and prescription medicines only as told by your health care provider. Keep all follow-up visits as told by your health care provider. This is important. Contact a health care provider if: You have redness, swelling, or pain around your incision. You have a fever. You have pain that is not relieved by your pain medicine. You have triggered your device because of fainting (syncope) or because of a heartbeat that feels like it is racing, slow, fluttering, or skipping (palpitations). Get help right away if you have: Chest pain. Difficulty breathing. Summary After the procedure, it is common to have soreness or discomfort near the incision. Change your dressing as told by your health care provider. Follow instructions from your health care provider about how to manage your implantable loop recorder and transmit the information. Keep all follow-up visits as told by your health care provider. This is important. This information is not intended to replace advice given to you by your health care provider. Make sure you discuss any questions you have with your health care provider. Document Released: 08/01/2015  Document Revised: 10/05/2017 Document Reviewed: 10/05/2017 Elsevier Patient Education  2020 [employer].

## 2022-10-24 DIAGNOSIS — I10 Essential (primary) hypertension: Secondary | ICD-10-CM | POA: Diagnosis not present

## 2022-10-24 DIAGNOSIS — E1165 Type 2 diabetes mellitus with hyperglycemia: Secondary | ICD-10-CM | POA: Diagnosis not present

## 2022-11-14 ENCOUNTER — Encounter (HOSPITAL_COMMUNITY): Payer: Self-pay

## 2022-11-14 ENCOUNTER — Emergency Department (HOSPITAL_COMMUNITY): Payer: MEDICARE

## 2022-11-14 ENCOUNTER — Inpatient Hospital Stay (HOSPITAL_COMMUNITY)
Admission: EM | Admit: 2022-11-14 | Discharge: 2022-11-16 | DRG: 178 | Disposition: A | Discharge status: Home Health | Payer: MEDICARE | Attending: Internal Medicine | Admitting: Internal Medicine

## 2022-11-14 ENCOUNTER — Other Ambulatory Visit: Payer: Self-pay

## 2022-11-14 DIAGNOSIS — Z0389 Encounter for observation for other suspected diseases and conditions ruled out: Secondary | ICD-10-CM | POA: Diagnosis not present

## 2022-11-14 DIAGNOSIS — Z823 Family history of stroke: Secondary | ICD-10-CM | POA: Diagnosis not present

## 2022-11-14 DIAGNOSIS — I10 Essential (primary) hypertension: Secondary | ICD-10-CM | POA: Insufficient documentation

## 2022-11-14 DIAGNOSIS — Z881 Allergy status to other antibiotic agents status: Secondary | ICD-10-CM | POA: Diagnosis not present

## 2022-11-14 DIAGNOSIS — R051 Acute cough: Secondary | ICD-10-CM | POA: Diagnosis not present

## 2022-11-14 DIAGNOSIS — R6510 Systemic inflammatory response syndrome (SIRS) of non-infectious origin without acute organ dysfunction: Secondary | ICD-10-CM | POA: Diagnosis present

## 2022-11-14 DIAGNOSIS — Z7982 Long term (current) use of aspirin: Secondary | ICD-10-CM

## 2022-11-14 DIAGNOSIS — R1310 Dysphagia, unspecified: Secondary | ICD-10-CM | POA: Diagnosis present

## 2022-11-14 DIAGNOSIS — R0902 Hypoxemia: Secondary | ICD-10-CM | POA: Diagnosis not present

## 2022-11-14 DIAGNOSIS — G510 Bell's palsy: Secondary | ICD-10-CM | POA: Diagnosis present

## 2022-11-14 DIAGNOSIS — Z8673 Personal history of transient ischemic attack (TIA), and cerebral infarction without residual deficits: Secondary | ICD-10-CM

## 2022-11-14 DIAGNOSIS — E785 Hyperlipidemia, unspecified: Secondary | ICD-10-CM | POA: Diagnosis present

## 2022-11-14 DIAGNOSIS — Z66 Do not resuscitate: Secondary | ICD-10-CM | POA: Diagnosis present

## 2022-11-14 DIAGNOSIS — Z7984 Long term (current) use of oral hypoglycemic drugs: Secondary | ICD-10-CM | POA: Diagnosis not present

## 2022-11-14 DIAGNOSIS — Z91012 Allergy to eggs: Secondary | ICD-10-CM

## 2022-11-14 DIAGNOSIS — Z8249 Family history of ischemic heart disease and other diseases of the circulatory system: Secondary | ICD-10-CM

## 2022-11-14 DIAGNOSIS — E1140 Type 2 diabetes mellitus with diabetic neuropathy, unspecified: Secondary | ICD-10-CM | POA: Diagnosis present

## 2022-11-14 DIAGNOSIS — U071 COVID-19: Secondary | ICD-10-CM | POA: Diagnosis not present

## 2022-11-14 DIAGNOSIS — Z86718 Personal history of other venous thrombosis and embolism: Secondary | ICD-10-CM

## 2022-11-14 DIAGNOSIS — R531 Weakness: Secondary | ICD-10-CM | POA: Diagnosis not present

## 2022-11-14 DIAGNOSIS — Z85828 Personal history of other malignant neoplasm of skin: Secondary | ICD-10-CM

## 2022-11-14 DIAGNOSIS — E860 Dehydration: Secondary | ICD-10-CM | POA: Diagnosis present

## 2022-11-14 DIAGNOSIS — Z91011 Allergy to milk products: Secondary | ICD-10-CM

## 2022-11-14 DIAGNOSIS — J029 Acute pharyngitis, unspecified: Secondary | ICD-10-CM | POA: Diagnosis not present

## 2022-11-14 DIAGNOSIS — R5383 Other fatigue: Secondary | ICD-10-CM | POA: Diagnosis not present

## 2022-11-14 DIAGNOSIS — A09 Infectious gastroenteritis and colitis, unspecified: Secondary | ICD-10-CM | POA: Diagnosis not present

## 2022-11-14 DIAGNOSIS — Z79899 Other long term (current) drug therapy: Secondary | ICD-10-CM | POA: Diagnosis not present

## 2022-11-14 DIAGNOSIS — R112 Nausea with vomiting, unspecified: Secondary | ICD-10-CM | POA: Diagnosis not present

## 2022-11-14 DIAGNOSIS — R1110 Vomiting, unspecified: Secondary | ICD-10-CM | POA: Insufficient documentation

## 2022-11-14 DIAGNOSIS — Z87891 Personal history of nicotine dependence: Secondary | ICD-10-CM | POA: Diagnosis not present

## 2022-11-14 DIAGNOSIS — E872 Acidosis, unspecified: Secondary | ICD-10-CM

## 2022-11-14 DIAGNOSIS — A419 Sepsis, unspecified organism: Secondary | ICD-10-CM

## 2022-11-14 DIAGNOSIS — Z96651 Presence of right artificial knee joint: Secondary | ICD-10-CM | POA: Diagnosis present

## 2022-11-14 DIAGNOSIS — R070 Pain in throat: Secondary | ICD-10-CM | POA: Diagnosis not present

## 2022-11-14 DIAGNOSIS — R0602 Shortness of breath: Secondary | ICD-10-CM | POA: Diagnosis not present

## 2022-11-14 DIAGNOSIS — R000 Tachycardia, unspecified: Secondary | ICD-10-CM | POA: Diagnosis not present

## 2022-11-14 DIAGNOSIS — R059 Cough, unspecified: Secondary | ICD-10-CM | POA: Diagnosis not present

## 2022-11-14 DIAGNOSIS — R1112 Projectile vomiting: Secondary | ICD-10-CM | POA: Diagnosis not present

## 2022-11-14 LAB — CBC
HCT: 40.2 % (ref 36.0–46.0)
Hemoglobin: 13.1 g/dL (ref 12.0–15.0)
MCH: 31 pg (ref 26.0–34.0)
MCHC: 32.6 g/dL (ref 30.0–36.0)
MCV: 95.3 fL (ref 80.0–100.0)
Platelets: 176 10*3/uL (ref 150–400)
RBC: 4.22 MIL/uL (ref 3.87–5.11)
RDW: 13.5 % (ref 11.5–15.5)
WBC: 9.5 10*3/uL (ref 4.0–10.5)
nRBC: 0 % (ref 0.0–0.2)

## 2022-11-14 LAB — URINALYSIS, ROUTINE W REFLEX MICROSCOPIC
Bacteria, UA: NONE SEEN
Bilirubin Urine: NEGATIVE
Glucose, UA: NEGATIVE mg/dL
Hgb urine dipstick: NEGATIVE
Ketones, ur: 5 mg/dL — AB
Leukocytes,Ua: NEGATIVE
Nitrite: NEGATIVE
Protein, ur: 100 mg/dL — AB
Specific Gravity, Urine: 1.01 (ref 1.005–1.030)
pH: 6 (ref 5.0–8.0)

## 2022-11-14 LAB — BASIC METABOLIC PANEL
Anion gap: 15 (ref 5–15)
BUN: 17 mg/dL (ref 8–23)
CO2: 20 mmol/L — ABNORMAL LOW (ref 22–32)
Calcium: 8.8 mg/dL — ABNORMAL LOW (ref 8.9–10.3)
Chloride: 102 mmol/L (ref 98–111)
Creatinine, Ser: 1.25 mg/dL — ABNORMAL HIGH (ref 0.44–1.00)
GFR, Estimated: 44 mL/min — ABNORMAL LOW (ref >60)
Glucose, Bld: 164 mg/dL — ABNORMAL HIGH (ref 70–99)
Potassium: 3.9 mmol/L (ref 3.5–5.1)
Sodium: 137 mmol/L (ref 135–145)

## 2022-11-14 LAB — RESP PANEL BY RT-PCR (RSV, FLU A&B, COVID)  RVPGX2
Influenza A by PCR: NEGATIVE
Influenza B by PCR: NEGATIVE
Resp Syncytial Virus by PCR: NEGATIVE
SARS Coronavirus 2 by RT PCR: POSITIVE — AB

## 2022-11-14 LAB — GROUP A STREP BY PCR: Group A Strep by PCR: NOT DETECTED

## 2022-11-14 LAB — LACTIC ACID, PLASMA: Lactic Acid, Venous: 2 mmol/L (ref 0.5–1.9)

## 2022-11-14 MED ORDER — ONDANSETRON HCL 4 MG/2ML IJ SOLN
4.0000 mg | Freq: Four times a day (QID) | INTRAMUSCULAR | Status: DC | PRN
  Administered 2022-11-14 – 2022-11-15 (×2): 4 mg via INTRAVENOUS
  Filled 2022-11-14 (×2): qty 2

## 2022-11-14 MED ORDER — IOHEXOL 350 MG/ML SOLN
65.0000 mL | Freq: Once | INTRAVENOUS | Status: AC | PRN
  Administered 2022-11-14: 65 mL via INTRAVENOUS

## 2022-11-14 MED ORDER — NIRMATRELVIR/RITONAVIR (PAXLOVID) TABLET (RENAL DOSING)
2.0000 | ORAL_TABLET | Freq: Two times a day (BID) | ORAL | Status: DC
  Administered 2022-11-15 – 2022-11-16 (×4): 2 via ORAL
  Filled 2022-11-14 (×2): qty 20

## 2022-11-14 MED ORDER — SODIUM CHLORIDE 0.9 % IV BOLUS
1000.0000 mL | Freq: Once | INTRAVENOUS | Status: AC
  Administered 2022-11-14: 1000 mL via INTRAVENOUS

## 2022-11-14 MED ORDER — LIDOCAINE VISCOUS HCL 2 % MT SOLN
15.0000 mL | Freq: Once | OROMUCOSAL | Status: AC
  Administered 2022-11-14: 15 mL via OROMUCOSAL
  Filled 2022-11-14: qty 15

## 2022-11-14 MED ORDER — ACETAMINOPHEN 325 MG PO TABS
650.0000 mg | ORAL_TABLET | Freq: Once | ORAL | Status: AC
  Administered 2022-11-14: 650 mg via ORAL
  Filled 2022-11-14: qty 2

## 2022-11-14 MED ORDER — HYDROCODONE-ACETAMINOPHEN 7.5-325 MG/15ML PO SOLN
10.0000 mL | Freq: Once | ORAL | Status: AC
  Administered 2022-11-14: 10 mL via ORAL
  Filled 2022-11-14: qty 15

## 2022-11-14 NOTE — ED Provider Notes (Incomplete)
77yo female, sore throat onset Sunday with body aches, chills. COVID +. Weak (walker at baseline, needing more assistance). Tachy, O2 sats 91%.  Hx DVT, not anticoagulated.  Cr at baseline, given fluids. Pending repeat lactic. CXR normal, pending CTA to r/o PE for tachy/hypoxic  PO Hydrocodone for throat pain (erythema on exam). Renal dosed Paxlovid.   Pending consult to hospitalist for admission.  Physical Exam  BP (!) 160/77   Pulse (!) 106   Temp 98.9 F (37.2 C) (Oral)   Resp (!) 21   Ht '5\' 8"'$  (1.727 m)   Wt 79.4 kg   SpO2 93%   BMI 26.61 kg/m   Physical Exam  Procedures  Procedures  ED Course / MDM    Medical Decision Making Amount and/or Complexity of Data Reviewed Labs: ordered. Radiology: ordered.  Risk OTC drugs. Prescription drug management.   ***

## 2022-11-14 NOTE — ED Notes (Signed)
Pt being taken to Xray at this time.

## 2022-11-14 NOTE — ED Provider Notes (Signed)
[facility] Provider Note   CSN: [id number] Arrival date & time: 11/14/22  1755     History  Chief Complaint  Patient presents with   Cough   Weakness    From home via ems with c/o cough, sore throat, and weakness worsening over 3 days. Patient unable to ambulate today.     Beth Macdonald is a 77 y.o. female with medical history of diabetes, DVT, Bell's palsy, osteoarthritis, squamous cell carcinoma, TIA.  Patient presents to ED for evaluation.  Patient reports that ever since Sunday she has had excruciating sore throat, body aches and chills along with an episode of nausea and vomiting.  Patient also complaining of trouble swallowing.  Patient states that on Sunday she went to the [facility] basketball tournament and developed symptoms throughout the course of the day.  Patient states that she has had low-grade fevers at home however cannot tell me what her temperature was.  Patient reports TIA.  Patient denies medications prior to arrival.  Patient denies abdominal pain, diarrhea, lower extremity swelling, chest pain, shortness of breath.  Patient denies dysuria.     Cough Associated symptoms: chills, fever, myalgias and sore throat   Weakness Associated symptoms: cough, fever, myalgias, nausea and vomiting        Home Medications Prior to Admission medications   Medication Sig Start Date End Date Taking? Authorizing Provider  aspirin EC 81 MG tablet Take 1 tablet (81 mg total) by mouth daily. Swallow whole. 06/06/22   Serita Butcher, MD  atorvastatin (LIPITOR) 20 MG tablet Take 20 mg by mouth daily. 06/22/22   [provider]  Calcium Carbonate Antacid (TUMS PO) Take 1 tablet by mouth daily as needed (acid control).    [provider]  clobetasol cream (TEMOVATE) [id number] % Apply 1 Application topically 2 (two) times daily. 04/24/22   Sheffield, Ronalee Red, PA-C  Clobetasol Prop Emollient Base (CLOBETASOL PROPIONATE E) 0.05 %  emollient cream Apply 1 Application topically 2 (two) times daily. 04/18/22   Sheffield, Ronalee Red, PA-C  cycloSPORINE (RESTASIS) 0.05 % ophthalmic emulsion Place 1 drop into both eyes 2 (two) times daily.    [provider]  desonide (DESOWEN) 0.05 % cream Apply topically 2 (two) times daily as needed (Rash). 03/22/21   Sheffield, Ronalee Red, PA-C  Fluorouracil (TOLAK) 4 % CREA Apply 1 application  topically at bedtime. X 2 weeks. 04/18/22   Sheffield, Ronalee Red, PA-C  ibuprofen (ADVIL) 800 MG tablet Take 1 tablet (800 mg total) by mouth every 6 (six) hours as needed for mild pain or moderate pain. 06/05/22   Serita Butcher, MD  lisinopril (ZESTRIL) 5 MG tablet Take 5 mg by mouth daily. 12/30/20   [provider]  metFORMIN (GLUCOPHAGE) 500 MG tablet Take 1,000 mg by mouth 2 (two) times daily with a meal. 12/14/18   [provider]  ondansetron (ZOFRAN) 4 MG tablet Take 1 tablet (4 mg total) by mouth every 6 (six) hours as needed for nausea. 01/02/22   Edmisten, Ok Anis, PA  Polyethyl Glycol-Propyl Glycol (SYSTANE FREE OP) Apply 1 drop to eye daily as needed (dryness/irritation).    [provider]  REPATHA SURECLICK [id number] MG/ML SOAJ Inject 140 mg into the skin every 14 (fourteen) days. Sunday 10/09/21   [provider]  Semaglutide (RYBELSUS) 7 MG TABS Take 7 mg by mouth daily. 06/07/21   [provider]      Allergies    Clindamycin/lincomycin,  Egg-derived products, Milk-related compounds, and Tetracyclines & related    Review of Systems   Review of Systems  Constitutional:  Positive for chills and fever.  HENT:  Positive for sore throat.   Respiratory:  Positive for cough.   Gastrointestinal:  Positive for nausea and vomiting.  Musculoskeletal:  Positive for myalgias.  Neurological:  Positive for weakness.  All other systems reviewed and are negative.   Physical Exam Updated Vital Signs BP (!) 160/77   Pulse (!) 106   Temp 98.9 F (37.2 C)  (Oral)   Resp (!) 21   Ht '5\' 8"'$  (1.727 m)   Wt 79.4 kg   SpO2 93%   BMI 26.61 kg/m  Physical Exam Vitals and nursing note reviewed.  Constitutional:      General: She is not in acute distress.    Appearance: She is not ill-appearing, toxic-appearing or diaphoretic.  HENT:     Head: Normocephalic and atraumatic.     Mouth/Throat:     Mouth: Mucous membranes are moist.     Pharynx: Oropharynx is clear. Posterior oropharyngeal erythema present. No oropharyngeal exudate.  Cardiovascular:     Rate and Rhythm: Normal rate and regular rhythm.  Pulmonary:     Effort: Pulmonary effort is normal.     Breath sounds: Normal breath sounds. No wheezing.  Abdominal:     General: Abdomen is flat.     Tenderness: There is no abdominal tenderness.  Musculoskeletal:     Cervical back: Normal range of motion and neck supple. No tenderness.     Right lower leg: No edema.     Left lower leg: No edema.  Skin:    General: Skin is warm and dry.     Capillary Refill: Capillary refill takes less than 2 seconds.  Neurological:     General: No focal deficit present.     Mental Status: She is alert and oriented to person, place, and time.     GCS: GCS eye subscore is 4. GCS verbal subscore is 5. GCS motor subscore is 6.     Sensory: Sensation is intact. No sensory deficit.     Motor: Motor function is intact. No weakness.     Coordination: Coordination is intact. Heel to [facility] Test normal.     ED Results / Procedures / Treatments   Labs (all labs ordered are listed, but only abnormal results are displayed) Labs Reviewed  RESP PANEL BY RT-PCR (RSV, FLU A&B, COVID)  RVPGX2 - Abnormal; Notable for the following components:      Result Value   SARS Coronavirus 2 by RT PCR POSITIVE (*)    All other components within normal limits  BASIC METABOLIC PANEL - Abnormal; Notable for the following components:   CO2 20 (*)    Glucose, Bld 164 (*)    Creatinine, Ser 1.25 (*)    Calcium 8.8 (*)    GFR,  Estimated 44 (*)    All other components within normal limits  LACTIC ACID, PLASMA - Abnormal; Notable for the following components:   Lactic Acid, Venous 2.0 (*)    All other components within normal limits  URINALYSIS, ROUTINE W REFLEX MICROSCOPIC - Abnormal; Notable for the following components:   Color, Urine STRAW (*)    Ketones, ur 5 (*)    Protein, ur 100 (*)    All other components within normal limits  GROUP A STREP BY PCR  CULTURE, BLOOD (ROUTINE X 2)  CULTURE, BLOOD (ROUTINE X 2)  CBC  LACTIC ACID, PLASMA    EKG None  Radiology DG Chest 2 View  Result Date: 11/14/2022 CLINICAL DATA:  shortness of breath, low o2 saturation EXAM: CHEST - 2 VIEW COMPARISON:  CT chest 12/23/2018 FINDINGS: Wireless cardiac device overlies the left chest. The heart and mediastinal contours are within normal limits. Elevated left hemidiaphragm. No focal consolidation. No pulmonary edema. No pleural effusion. No pneumothorax. No acute osseous abnormality. IMPRESSION: No active cardiopulmonary disease. Electronically Signed   By: Iven Finn M.D.   On: 11/14/2022 19:45    Procedures .Critical Care  Performed by: Azucena Cecil, PA-C Authorized by: Azucena Cecil, PA-C   Critical care provider statement:    Critical care time was exclusive of:  Separately billable procedures and treating other patients   Critical care was necessary to treat or prevent imminent or life-threatening deterioration of the following conditions:  Respiratory failure   Critical care was time spent personally by me on the following activities:  Blood draw for specimens, development of treatment plan with patient or surrogate, discussions with consultants, discussions with primary provider, evaluation of patient's response to treatment, examination of patient, ordering and performing treatments and interventions, ordering and review of laboratory studies, ordering and review of radiographic studies, pulse  oximetry, re-evaluation of patient's condition and review of old charts   I assumed direction of critical care for this patient from another provider in my specialty: no     Care discussed with: admitting provider      Medications Ordered in ED Medications  ondansetron (ZOFRAN) injection 4 mg (4 mg Intravenous Given 11/14/22 2339)  nirmatrelvir/ritonavir (renal dosing) (PAXLOVID) 2 tablet (has no administration in time range)  sodium chloride 0.9 % bolus 1,000 mL (0 mLs Intravenous Stopped 11/14/22 2055)  acetaminophen (TYLENOL) tablet 650 mg (650 mg Oral Given 11/14/22 1958)  lidocaine (XYLOCAINE) 2 % viscous mouth solution 15 mL (15 mLs Mouth/Throat Given 11/14/22 2021)  HYDROcodone-acetaminophen (HYCET) 7.5-325 mg/15 ml solution 10 mL (10 mLs Oral Given 11/14/22 2346)  iohexol (OMNIPAQUE) 350 MG/ML injection 65 mL (65 mLs Intravenous Contrast Given 11/14/22 2320)    ED Course/ Medical Decision Making/ A&P  Medical Decision Making Amount and/or Complexity of Data Reviewed Labs: ordered. Radiology: ordered.  Risk OTC drugs. Prescription drug management.   77 year old female presents to the ED for evaluation.  Please see HPI for further details.  On examination the patient is afebrile, tachycardic.  Lung sounds clear bilaterally, oxygen saturation of 93% on room air.  Abdomen soft and compressible throughout.  Posterior oropharynx is erythematous without exudate, no sign of RPA, PTA.  Uvula midline.   CBC without leukocytosis or anemia.  Viral panel positive for COVID-19.  BMP with creatinine of 1.25 which is patient baseline, no electrolyte derangement.  Lactic acid elevated to 2.0.  Awaiting second lactic acid.  Blood cultures pending.  Urinalysis pending.  Group A strep negative.  Chest x-ray shows no consolidations or effusions.  EKG is nonischemic.  Due to patient coming in tachycardic, low oxygen saturation we will proceed with CT angiogram to rule out blood clot.  Will also proceed  with admission, [facility] hospitalist has been paged.  Have provided patient initial dose of Paxlovid.  Also provided patient oral hydrocodone for pain in her throat, reports she is unable to swallow Tylenol.  Patient provided 1 L fluid for tachycardia.  At end of shift, patient CTA pending. [facility] hospitalist has not returned call. Patient signed out to Suella Broad PA-C  for further management.   Final Clinical Impression(s) / ED Diagnoses Final diagnoses:  [id number]  Hypoxia    Rx / DC Orders ED Discharge Orders     None         Azucena Cecil, PA-C 11/15/22 0000    Lorelle Gibbs, DO 11/15/22 1631

## 2022-11-14 NOTE — ED Provider Notes (Signed)
77yo female, sore throat onset Sunday with body aches, chills. COVID +. Weak (walker at baseline, needing more assistance). Tachy, O2 sats 91%.  Hx DVT, not anticoagulated.  Cr at baseline, given fluids. Pending repeat lactic. CXR normal, pending CTA to r/o PE for tachy/hypoxic  PO Hydrocodone for throat pain (erythema on exam). Renal dosed Paxlovid.   Pending consult to hospitalist for admission.  Physical Exam  BP (!) 171/74   Pulse 92   Temp 98.9 F (37.2 C) (Oral)   Resp 18   Ht '5\' 8"'$  (1.727 m)   Wt 79.4 kg   SpO2 95%   BMI 26.61 kg/m   Physical Exam  Procedures  Procedures  ED Course / MDM    Medical Decision Making Amount and/or Complexity of Data Reviewed Labs: ordered. Radiology: ordered.  Risk OTC drugs. Prescription drug management. Decision regarding hospitalization.  CTA negative for PE.  Case discussed with Dr. Marlowe Sax with [facility] who accepts for admission       Roque Lias 11/15/22 0242    Orpah Greek, MD 11/15/22 [phone]

## 2022-11-14 NOTE — ED Triage Notes (Signed)
Patient reports cough and sore throat x 3 days. Usually ambulatory with cane, today required assist of 2 people to stand. Per family she has been incontinent of urine today. Patient states that she is not aware of any issues. Patient is alert and oriented x 3 at this time.

## 2022-11-15 ENCOUNTER — Observation Stay (HOSPITAL_COMMUNITY): Payer: MEDICARE

## 2022-11-15 DIAGNOSIS — I10 Essential (primary) hypertension: Secondary | ICD-10-CM | POA: Diagnosis present

## 2022-11-15 DIAGNOSIS — E785 Hyperlipidemia, unspecified: Secondary | ICD-10-CM | POA: Diagnosis present

## 2022-11-15 DIAGNOSIS — Z91011 Allergy to milk products: Secondary | ICD-10-CM | POA: Diagnosis not present

## 2022-11-15 DIAGNOSIS — Z881 Allergy status to other antibiotic agents status: Secondary | ICD-10-CM | POA: Diagnosis not present

## 2022-11-15 DIAGNOSIS — Z96651 Presence of right artificial knee joint: Secondary | ICD-10-CM | POA: Diagnosis present

## 2022-11-15 DIAGNOSIS — E860 Dehydration: Secondary | ICD-10-CM | POA: Diagnosis present

## 2022-11-15 DIAGNOSIS — Z7982 Long term (current) use of aspirin: Secondary | ICD-10-CM | POA: Diagnosis not present

## 2022-11-15 DIAGNOSIS — R6510 Systemic inflammatory response syndrome (SIRS) of non-infectious origin without acute organ dysfunction: Secondary | ICD-10-CM | POA: Diagnosis present

## 2022-11-15 DIAGNOSIS — Z8673 Personal history of transient ischemic attack (TIA), and cerebral infarction without residual deficits: Secondary | ICD-10-CM | POA: Diagnosis not present

## 2022-11-15 DIAGNOSIS — R531 Weakness: Secondary | ICD-10-CM | POA: Diagnosis present

## 2022-11-15 DIAGNOSIS — Z7984 Long term (current) use of oral hypoglycemic drugs: Secondary | ICD-10-CM | POA: Diagnosis not present

## 2022-11-15 DIAGNOSIS — Z66 Do not resuscitate: Secondary | ICD-10-CM | POA: Diagnosis present

## 2022-11-15 DIAGNOSIS — U071 COVID-19: Secondary | ICD-10-CM | POA: Diagnosis present

## 2022-11-15 DIAGNOSIS — E872 Acidosis, unspecified: Secondary | ICD-10-CM | POA: Diagnosis present

## 2022-11-15 DIAGNOSIS — A419 Sepsis, unspecified organism: Secondary | ICD-10-CM

## 2022-11-15 DIAGNOSIS — Z91012 Allergy to eggs: Secondary | ICD-10-CM | POA: Diagnosis not present

## 2022-11-15 DIAGNOSIS — Z79899 Other long term (current) drug therapy: Secondary | ICD-10-CM | POA: Diagnosis not present

## 2022-11-15 DIAGNOSIS — Z823 Family history of stroke: Secondary | ICD-10-CM | POA: Diagnosis not present

## 2022-11-15 DIAGNOSIS — R1110 Vomiting, unspecified: Secondary | ICD-10-CM | POA: Insufficient documentation

## 2022-11-15 DIAGNOSIS — Z85828 Personal history of other malignant neoplasm of skin: Secondary | ICD-10-CM | POA: Diagnosis not present

## 2022-11-15 DIAGNOSIS — E1140 Type 2 diabetes mellitus with diabetic neuropathy, unspecified: Secondary | ICD-10-CM | POA: Diagnosis present

## 2022-11-15 DIAGNOSIS — R1310 Dysphagia, unspecified: Secondary | ICD-10-CM | POA: Diagnosis present

## 2022-11-15 DIAGNOSIS — Z86718 Personal history of other venous thrombosis and embolism: Secondary | ICD-10-CM | POA: Diagnosis not present

## 2022-11-15 DIAGNOSIS — Z87891 Personal history of nicotine dependence: Secondary | ICD-10-CM | POA: Diagnosis not present

## 2022-11-15 DIAGNOSIS — Z8249 Family history of ischemic heart disease and other diseases of the circulatory system: Secondary | ICD-10-CM | POA: Diagnosis not present

## 2022-11-15 DIAGNOSIS — G510 Bell's palsy: Secondary | ICD-10-CM | POA: Diagnosis present

## 2022-11-15 DIAGNOSIS — Z0389 Encounter for observation for other suspected diseases and conditions ruled out: Secondary | ICD-10-CM | POA: Diagnosis not present

## 2022-11-15 DIAGNOSIS — R112 Nausea with vomiting, unspecified: Secondary | ICD-10-CM | POA: Diagnosis not present

## 2022-11-15 DIAGNOSIS — R0902 Hypoxemia: Secondary | ICD-10-CM | POA: Diagnosis present

## 2022-11-15 LAB — CBG MONITORING, ED
Glucose-Capillary: 143 mg/dL — ABNORMAL HIGH (ref 70–99)
Glucose-Capillary: 136 mg/dL — ABNORMAL HIGH (ref 70–99)

## 2022-11-15 LAB — GLUCOSE, CAPILLARY
Glucose-Capillary: 81 mg/dL (ref 70–99)
Glucose-Capillary: 134 mg/dL — ABNORMAL HIGH (ref 70–99)

## 2022-11-15 LAB — COMPREHENSIVE METABOLIC PANEL
ALT: 14 U/L (ref 0–44)
AST: 18 U/L (ref 15–41)
Albumin: 3.2 g/dL — ABNORMAL LOW (ref 3.5–5.0)
Alkaline Phosphatase: 33 U/L — ABNORMAL LOW (ref 38–126)
Anion gap: 11 (ref 5–15)
BUN: 14 mg/dL (ref 8–23)
CO2: 25 mmol/L (ref 22–32)
Calcium: 8.5 mg/dL — ABNORMAL LOW (ref 8.9–10.3)
Chloride: 102 mmol/L (ref 98–111)
Creatinine, Ser: 1.02 mg/dL — ABNORMAL HIGH (ref 0.44–1.00)
GFR, Estimated: 56 mL/min — ABNORMAL LOW (ref >60)
Glucose, Bld: 135 mg/dL — ABNORMAL HIGH (ref 70–99)
Potassium: 3.4 mmol/L — ABNORMAL LOW (ref 3.5–5.1)
Sodium: 138 mmol/L (ref 135–145)
Total Bilirubin: 0.8 mg/dL (ref 0.3–1.2)
Total Protein: 6.4 g/dL — ABNORMAL LOW (ref 6.5–8.1)

## 2022-11-15 LAB — LACTIC ACID, PLASMA: Lactic Acid, Venous: 1.3 mmol/L (ref 0.5–1.9)

## 2022-11-15 LAB — LIPASE, BLOOD: Lipase: 32 U/L (ref 11–51)

## 2022-11-15 MED ORDER — SODIUM CHLORIDE 0.9 % IV SOLN: INTRAVENOUS | Status: DC

## 2022-11-15 MED ORDER — INSULIN ASPART 100 UNIT/ML IJ SOLN
0.0000 [IU] | Freq: Three times a day (TID) | INTRAMUSCULAR | Status: DC
  Administered 2022-11-16: 1 [IU] via SUBCUTANEOUS

## 2022-11-15 MED ORDER — ATORVASTATIN CALCIUM 10 MG PO TABS: 20.0000 mg | ORAL_TABLET | Freq: Every day | ORAL | Status: DC

## 2022-11-15 MED ORDER — GUAIFENESIN-DM 100-10 MG/5ML PO SYRP
5.0000 mL | ORAL_SOLUTION | Freq: Four times a day (QID) | ORAL | Status: DC | PRN
  Filled 2022-11-15: qty 5

## 2022-11-15 MED ORDER — ASPIRIN 81 MG PO TBEC
81.0000 mg | DELAYED_RELEASE_TABLET | Freq: Every day | ORAL | Status: DC
  Administered 2022-11-15 – 2022-11-16 (×2): 81 mg via ORAL
  Filled 2022-11-15 (×2): qty 1

## 2022-11-15 MED ORDER — LISINOPRIL 10 MG PO TABS
10.0000 mg | ORAL_TABLET | Freq: Every day | ORAL | Status: DC
  Administered 2022-11-15 – 2022-11-16 (×2): 10 mg via ORAL
  Filled 2022-11-15 (×2): qty 1

## 2022-11-15 MED ORDER — BENZONATATE 100 MG PO CAPS
100.0000 mg | ORAL_CAPSULE | Freq: Three times a day (TID) | ORAL | Status: DC
  Administered 2022-11-15 – 2022-11-16 (×4): 100 mg via ORAL
  Filled 2022-11-15 (×4): qty 1

## 2022-11-15 MED ORDER — INSULIN ASPART 100 UNIT/ML IJ SOLN: 0.0000 [IU] | Freq: Every day | INTRAMUSCULAR | Status: DC

## 2022-11-15 MED ORDER — SODIUM CHLORIDE 0.9 % IV SOLN: INTRAVENOUS | Status: AC

## 2022-11-15 MED ORDER — MAGIC MOUTHWASH W/LIDOCAINE
5.0000 mL | Freq: Three times a day (TID) | ORAL | Status: DC | PRN
  Administered 2022-11-15: 5 mL via ORAL
  Filled 2022-11-15: qty 5

## 2022-11-15 MED ORDER — MENTHOL 3 MG MT LOZG
1.0000 | LOZENGE | OROMUCOSAL | Status: DC | PRN
  Filled 2022-11-15: qty 9

## 2022-11-15 NOTE — H&P (Signed)
History and Physical    Beth Macdonald [id number] DOB: [birth year] DOA: 11/14/2022  PCP: [name], MD  Patient coming from: Home  Chief Complaint: Cough  HPI: Beth Macdonald is a 77 y.o. female with medical history significant of CVA/TIA, right-sided facial nerve palsy, hyperlipidemia, hypertension, type 2 diabetes, history of DVT presented to the ED from home via EMS complaining of cough, sore throat, and generalized weakness x 3 days.  In the ED, patient tachycardic to 100-110s but afebrile.  Oxygen saturation as low as 91% on room air.  Labs showing no leukocytosis, bicarb 20, anion gap 15, glucose 164, creatinine 1.2 (baseline 0.9-1.2), SARS-CoV-2 PCR positive, group A strep PCR negative, lactic acid 2.0, blood cultures drawn.  CTA chest negative for PE or pneumonia. Patient received hydrocodone, acetaminophen, Paxlovid, Zofran, and 1 L normal saline bolus.  [facility] called to admit.  History provided by the patient and her spouse at bedside.  Patient is reporting 3-day history of cough, sore throat, body aches, low-grade fevers, and generalized weakness.  She is fully vaccinated against COVID.  She reports 1 episode of vomiting yesterday morning and no recurrence since then.  She is hungry and is requesting something to eat.  Denies abdominal pain or diarrhea.  Patient's spouse is concerned that patient's legs have very weak to the point that she could barely walk at home.  She lost her balance while trying to walk today but spouse was present with her and patient did not fall to the ground or sustain any injuries.  Denies head injury or loss of consciousness.  Patient denies back pain, saddle anesthesia, or any change in bowel/bladder habits.  Review of Systems:  Review of Systems  All other systems reviewed and are negative.   Past Medical History:  Diagnosis Date   Aortic atherosclerosis ([place])    Diabetes mellitus without complication ([place])    Type 2   Diabetic neuropathy  ([place])    DVT of proximal lower limb ([place])    Facial paralysis/Bells palsy    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[place])    Hyperlipidemia    Mitral valve insufficiency    Osteoarthritis    SCCA (squamous cell carcinoma) of skin 03/22/2021   Bridge of nose (in situ)   Skull fracture ([place])    age 51   Squamous cell carcinoma of skin 03/22/2021   Right forehead (in situ)   TIA (transient ischemic attack) 2020   Varicose vein of leg     Past Surgical History:  Procedure Laterality Date   ELBOW SURGERY Bilateral    20 yrs ago   ENDOVENOUS ABLATION SAPHENOUS VEIN W/ LASER Bilateral    ENDOVENOUS ABLATION SAPHENOUS VEIN W/ LASER Left 11/19/2019   endovenous laser ablation anterior accessory branch of left GSV and stab phlebectomy> 20 incisions left leg by Gae Gallop MD    shoulder Bilateral    [phone]   TOENAIL EXCISION Right 2019   Big toe   TONSILLECTOMY Bilateral    TOTAL KNEE ARTHROPLASTY Right 01/01/2022   Procedure: TOTAL KNEE ARTHROPLASTY;  Surgeon: Gaynelle Arabian, MD;  Location: [facility];  Service: Orthopedics;  Laterality: Right;     reports that she quit smoking about 44 years ago. Her smoking use included cigarettes. She has a 20.00 pack-year smoking history. She has never used smokeless tobacco. She reports current alcohol use. She reports that she does not use drugs.  Allergies  Allergen Reactions   Clindamycin/Lincomycin Other (See Comments)    Cannot tolerate  mycins causes C-Diff   Egg-Derived Products Diarrhea   Milk-Related Compounds Diarrhea   Tetracyclines & Related Other (See Comments)    "Feel bad"    Family History  Problem Relation Age of Onset   Stroke Mother    Hypertension Father    Dementia Father    Leukemia Brother    Stroke Brother    Colon cancer Neg Hx    Esophageal cancer Neg Hx    Pancreatic cancer Neg Hx    Stomach cancer Neg Hx     Prior to Admission medications   Medication Sig Start Date End Date Taking? Authorizing Provider   aspirin EC 81 MG tablet Take 1 tablet (81 mg total) by mouth daily. Swallow whole. 06/06/22   Serita Butcher, MD  atorvastatin (LIPITOR) 20 MG tablet Take 20 mg by mouth daily. 06/22/22   [provider]  Calcium Carbonate Antacid (TUMS PO) Take 1 tablet by mouth daily as needed (acid control).    [provider]  clobetasol cream (TEMOVATE) [id number] % Apply 1 Application topically 2 (two) times daily. 04/24/22   Sheffield, Ronalee Red, PA-C  Clobetasol Prop Emollient Base (CLOBETASOL PROPIONATE E) 0.05 % emollient cream Apply 1 Application topically 2 (two) times daily. 04/18/22   Sheffield, Ronalee Red, PA-C  cycloSPORINE (RESTASIS) 0.05 % ophthalmic emulsion Place 1 drop into both eyes 2 (two) times daily.    [provider]  desonide (DESOWEN) 0.05 % cream Apply topically 2 (two) times daily as needed (Rash). 03/22/21   Sheffield, Ronalee Red, PA-C  Fluorouracil (TOLAK) 4 % CREA Apply 1 application  topically at bedtime. X 2 weeks. 04/18/22   Sheffield, Ronalee Red, PA-C  ibuprofen (ADVIL) 800 MG tablet Take 1 tablet (800 mg total) by mouth every 6 (six) hours as needed for mild pain or moderate pain. 06/05/22   Serita Butcher, MD  lisinopril (ZESTRIL) 5 MG tablet Take 5 mg by mouth daily. 12/30/20   [provider]  metFORMIN (GLUCOPHAGE) 500 MG tablet Take 1,000 mg by mouth 2 (two) times daily with a meal. 12/14/18   [provider]  ondansetron (ZOFRAN) 4 MG tablet Take 1 tablet (4 mg total) by mouth every 6 (six) hours as needed for nausea. 01/02/22   Edmisten, Ok Anis, PA  Polyethyl Glycol-Propyl Glycol (SYSTANE FREE OP) Apply 1 drop to eye daily as needed (dryness/irritation).    [provider]  REPATHA SURECLICK [id number] MG/ML SOAJ Inject 140 mg into the skin every 14 (fourteen) days. Sunday 10/09/21   [provider]  Semaglutide (RYBELSUS) 7 MG TABS Take 7 mg by mouth daily. 06/07/21   [provider]    Physical Exam: Vitals:   11/14/22 1900  11/14/22 1915 11/14/22 2000 11/14/22 2300  BP: (!) 140/64 (!) 156/69 (!) 160/77 (!) 171/74  Pulse: (!) 103 (!) 103 (!) 106 92  Resp: (!) 26 20 (!) 21 18  Temp:      TempSrc:      SpO2: 93% 94% 93% 95%  Weight:      Height:        Physical Exam Vitals reviewed.  Constitutional:      General: She is not in acute distress.    Comments: Appears lethargic  HENT:     Head: Normocephalic and atraumatic.  Eyes:     Extraocular Movements: Extraocular movements intact.  Cardiovascular:     Rate and Rhythm: Normal rate and regular rhythm.     Pulses: Normal pulses.  Pulmonary:  Effort: Pulmonary effort is normal. No respiratory distress.     Breath sounds: Normal breath sounds. No wheezing or rales.  Abdominal:     General: Bowel sounds are normal. There is no distension.     Palpations: Abdomen is soft.     Tenderness: There is no abdominal tenderness.  Musculoskeletal:     Cervical back: Normal range of motion.     Right lower leg: No edema.     Left lower leg: No edema.  Skin:    General: Skin is warm and dry.  Neurological:     Mental Status: She is alert and oriented to person, place, and time.     Sensory: No sensory deficit.     Motor: No weakness.     Comments: Right-sided facial droop (chronic per patient)     Labs on Admission: I have personally reviewed following labs and imaging studies  CBC: Recent Labs  Lab 11/14/22 1810  WBC 9.5  HGB 13.1  HCT 40.2  MCV 95.3  PLT 0000000   Basic Metabolic Panel: Recent Labs  Lab 11/14/22 1810  NA 137  K 3.9  CL 102  CO2 20*  GLUCOSE 164*  BUN 17  CREATININE 1.25*  CALCIUM 8.8*   GFR: Estimated Creatinine Clearance: 41 mL/min (A) (by C-G formula based on SCr of 1.25 mg/dL (H)). Liver Function Tests: No results for input(s): "AST", "ALT", "ALKPHOS", "BILITOT", "PROT", "ALBUMIN" in the last 168 hours. No results for input(s): "LIPASE", "AMYLASE" in the last 168 hours. No results for input(s): "AMMONIA" in the  last 168 hours. Coagulation Profile: No results for input(s): "INR", "PROTIME" in the last 168 hours. Cardiac Enzymes: No results for input(s): "CKTOTAL", "CKMB", "CKMBINDEX", "TROPONINI" in the last 168 hours. BNP (last 3 results) No results for input(s): "PROBNP" in the last 8760 hours. HbA1C: No results for input(s): "HGBA1C" in the last 72 hours. CBG: No results for input(s): "GLUCAP" in the last 168 hours. Lipid Profile: No results for input(s): "CHOL", "HDL", "LDLCALC", "TRIG", "CHOLHDL", "LDLDIRECT" in the last 72 hours. Thyroid Function Tests: No results for input(s): "TSH", "T4TOTAL", "FREET4", "T3FREE", "THYROIDAB" in the last 72 hours. Anemia Panel: No results for input(s): "VITAMINB12", "FOLATE", "FERRITIN", "TIBC", "IRON", "RETICCTPCT" in the last 72 hours. Urine analysis:    Component Value Date/Time   COLORURINE STRAW (A) 11/14/2022 2251   APPEARANCEUR CLEAR 11/14/2022 2251   LABSPEC 1.010 11/14/2022 2251   PHURINE 6.0 11/14/2022 2251   GLUCOSEU NEGATIVE 11/14/2022 2251   HGBUR NEGATIVE 11/14/2022 2251   BILIRUBINUR NEGATIVE 11/14/2022 2251   KETONESUR 5 (A) 11/14/2022 2251   PROTEINUR 100 (A) 11/14/2022 2251   NITRITE NEGATIVE 11/14/2022 2251   LEUKOCYTESUR NEGATIVE 11/14/2022 2251    Radiological Exams on Admission: CT Angio Chest PE W and/or Wo Contrast  Result Date: 11/15/2022 CLINICAL DATA:  Pulmonary embolism (PE) suspected, high prob Patient reports cough and sore throat x 3 days. Usually ambulatory with cane, today required assist of 2 people to stand. Per family she has been incontinent of urine today. EXAM: CT ANGIOGRAPHY CHEST WITH CONTRAST TECHNIQUE: Multidetector CT imaging of the chest was performed using the standard protocol during bolus administration of intravenous contrast. Multiplanar CT image reconstructions and MIPs were obtained to evaluate the vascular anatomy. RADIATION DOSE REDUCTION: This exam was performed according to the departmental  dose-optimization program which includes automated exposure control, adjustment of the mA and/or kV according to patient size and/or use of iterative reconstruction technique. CONTRAST:  10m OMNIPAQUE IOHEXOL  350 MG/ML SOLN COMPARISON:  CT chest 12/23/2018 FINDINGS: Cardiovascular: Satisfactory opacification of the pulmonary arteries to the segmental level. No evidence of pulmonary embolism. Normal heart size. No significant pericardial effusion. The thoracic aorta is normal in caliber. Mild atherosclerotic plaque of the thoracic aorta. No coronary artery calcifications. Mediastinum/Nodes: No enlarged mediastinal, hilar, or axillary lymph nodes. Thyroid gland, trachea, and esophagus demonstrate no significant findings. Lungs/Pleura: Bilateral lower lobe subsegmental atelectasis. No focal consolidation. Subpleural triangular pulmonary micronodules likely intrapulmonary lymph nodes-no further follow-up indicated. No pulmonary mass. No pleural effusion. No pneumothorax. Upper Abdomen: No acute abnormality.  Atherosclerotic plaque. Musculoskeletal: No chest wall abnormality. No suspicious lytic or blastic osseous lesions. No acute displaced fracture. Multilevel degenerative changes of the spine. Review of the MIP images confirms the above findings. IMPRESSION: 1. No pulmonary embolus. 2. No acute intrathoracic abnormality. Electronically Signed   By: Iven Finn M.D.   On: 11/15/2022 00:30   DG Chest 2 View  Result Date: 11/14/2022 CLINICAL DATA:  shortness of breath, low o2 saturation EXAM: CHEST - 2 VIEW COMPARISON:  CT chest 12/23/2018 FINDINGS: Wireless cardiac device overlies the left chest. The heart and mediastinal contours are within normal limits. Elevated left hemidiaphragm. No focal consolidation. No pulmonary edema. No pleural effusion. No pneumothorax. No acute osseous abnormality. IMPRESSION: No active cardiopulmonary disease. Electronically Signed   By: Iven Finn M.D.   On: 11/14/2022 19:45     EKG: Independently reviewed.  Sinus tachycardia.  Assessment and Plan  Sepsis secondary to COVID-19 viral infection Patient presenting with 3-day history of cough, sore throat, body aches, subjective fevers, and generalized weakness. Met criteria for sepsis at the time of presentation with tachycardia and borderline lactic acidosis on labs.  SARS-CoV-2 PCR positive.  Group A strep PCR negative.  Currently satting 93-94 % on room air at rest.  CTA chest negative for PE or pneumonia.  Continue Paxlovid x 5 days.  No indication for steroids at this time.  Blood cultures pending.  Trend lactate.  Supplemental oxygen as needed.  Bilateral lower extremity weakness Likely due to acute viral illness/physical deconditioning.  Strength 5 out of 5 in bilateral lower extremities on exam and sensation intact but patient's spouse is concerned that her bilateral leg weakness was so severe at home that patient could barely walk.  She lost her balance while trying to walk today but spouse was present with her and patient did not fall to the ground or sustain any injuries. Patient denies back pain or any other red flag symptoms such as saddle anesthesia or bowel/bladder dysfunction.  Stat CT head ordered.  Fall precautions, PT/OT eval.  Emesis Likely related to COVID infection.  Patient reports 1 episode of vomiting yesterday morning and no recurrence since then.  No abdominal pain or tenderness.  Check lipase and LFTs.  Mild normal anion gap metabolic acidosis Continue IV fluid hydration and repeat labs.  Non-insulin-dependent type 2 diabetes A1c 6.0 in October 2023.  Repeat A1c and order sensitive sliding scale insulin.  Hypertension: Systolic currently in the 150s. History of CVA/TIA Hyperlipidemia Pharmacy med rec pending.  DVT prophylaxis: SCDs until CT head is done. Code Status: DNR/DNI (discussed with the patient) Family Communication: Spouse at bedside. Level of care: Telemetry bed Admission  status: It is my clinical opinion that referral for OBSERVATION is reasonable and necessary in this patient based on the above information provided. The aforementioned taken together are felt to place the patient at high risk for further clinical deterioration.  However, it is anticipated that the patient may be medically stable for discharge from the hospital within 24 to 48 hours.   Shela Leff MD [facility]  If 7PM-7AM, please contact night-coverage [email or web address]  11/15/2022, 2:04 AM

## 2022-11-15 NOTE — ED Notes (Signed)
ED TO INPATIENT HANDOFF REPORT  ED Nurse Name and Phone #: Chestine Spore, RN [phone]  S Name/Age/Gender Beth Macdonald 77 y.o. female Room/Bed: 023C/023C  Code Status   Code Status: DNR  Home/SNF/Other Home Patient oriented to: self, place, time, and situation Is this baseline? Yes   Triage Complete: Triage complete  Chief Complaint COVID-19 virus infection [U07.1]  Triage Note Patient reports cough and sore throat x 3 days. Usually ambulatory with cane, today required assist of 2 people to stand. Per family she has been incontinent of urine today. Patient states that she is not aware of any issues. Patient is alert and oriented x 3 at this time.    Allergies Allergies  Allergen Reactions   Clindamycin/Lincomycin Other (See Comments)    Cannot tolerate mycins causes C-Diff   Milk-Related Compounds Diarrhea   Tetracyclines & Related Other (See Comments)    "Feel really nervous"    Level of Care/Admitting Diagnosis ED Disposition     ED Disposition  Admit   Condition  --   Comment  Hospital Area: [facility] [[id number]]  Level of Care: Telemetry Medical [104]  May admit patient to [facility] or [facility] if equivalent level of care is available:: No  Covid Evaluation: Confirmed COVID Positive  Diagnosis: COVID-19 virus infection [id number]  Admitting Physician: Shela Leff [id number]  Attending Physician: Terrilee Croak [id number]  Certification:: I certify this patient will need inpatient services for at least 2 midnights  Estimated Length of Stay: 2          B Medical/Surgery History Past Medical History:  Diagnosis Date   Aortic atherosclerosis ([place])    Diabetes mellitus without complication ([place])    Type 2   Diabetic neuropathy ([place])    DVT of proximal lower limb ([place])    Facial paralysis/Bells palsy    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[place])    Hyperlipidemia    Mitral valve insufficiency    Osteoarthritis    SCCA  (squamous cell carcinoma) of skin 03/22/2021   Bridge of nose (in situ)   Skull fracture ([place])    age 17   Squamous cell carcinoma of skin 03/22/2021   Right forehead (in situ)   TIA (transient ischemic attack) 2020   Varicose vein of leg    Past Surgical History:  Procedure Laterality Date   ELBOW SURGERY Bilateral    20 yrs ago   ENDOVENOUS ABLATION SAPHENOUS VEIN W/ LASER Bilateral    ENDOVENOUS ABLATION SAPHENOUS VEIN W/ LASER Left 11/19/2019   endovenous laser ablation anterior accessory branch of left GSV and stab phlebectomy> 20 incisions left leg by Gae Gallop MD    shoulder Bilateral    [phone]   TOENAIL EXCISION Right 2019   Big toe   TONSILLECTOMY Bilateral    TOTAL KNEE ARTHROPLASTY Right 01/01/2022   Procedure: TOTAL KNEE ARTHROPLASTY;  Surgeon: Gaynelle Arabian, MD;  Location: [facility];  Service: Orthopedics;  Laterality: Right;     A IV Location/Drains/Wounds Patient Lines/Drains/Airways Status     Active Line/Drains/Airways     Name Placement date Placement time Site Days   Peripheral IV 11/14/22 18 G Right Antecubital 11/14/22  1757  Antecubital  1   Peripheral IV 11/14/22 20 G Left Antecubital 11/14/22  2232  Antecubital  1            Intake/Output Last 24 hours  Intake/Output Summary (Last 24 hours) at 11/15/2022 1332 Last data filed at 11/14/2022 2055 Gross per 24  hour  Intake 1065.6 ml  Output --  Net 1065.6 ml    Labs/Imaging Results for orders placed or performed during the hospital encounter of 11/14/22 (from the past 48 hour(s))  CBC     Status: None   Collection Time: 11/14/22  6:10 PM  Result Value Ref Range   WBC 9.5 4.0 - 10.5 K/uL   RBC 4.22 3.87 - 5.11 MIL/uL   Hemoglobin 13.1 12.0 - 15.0 g/dL   HCT 40.2 36.0 - 46.0 %   MCV 95.3 80.0 - 100.0 fL   MCH 31.0 26.0 - 34.0 pg   MCHC 32.6 30.0 - 36.0 g/dL   RDW 13.5 11.5 - 15.5 %   Platelets 176 150 - 400 K/uL   nRBC 0.0 0.0 - 0.2 %    Comment: Performed at [facility], [place] [address] [place], [place] [id number]  Basic metabolic panel     Status: Abnormal   Collection Time: 11/14/22  6:10 PM  Result Value Ref Range   Sodium 137 135 - 145 mmol/L   Potassium 3.9 3.5 - 5.1 mmol/L   Chloride 102 98 - 111 mmol/L   CO2 20 (L) 22 - 32 mmol/L   Glucose, Bld 164 (H) 70 - 99 mg/dL    Comment: Glucose reference range applies only to samples taken after fasting for at least 8 hours.   BUN 17 8 - 23 mg/dL   Creatinine, Ser 1.25 (H) 0.44 - 1.00 mg/dL   Calcium 8.8 (L) 8.9 - 10.3 mg/dL   GFR, Estimated 44 (L) >60 mL/min    Comment: (NOTE) Calculated using the CKD-EPI Creatinine Equation (2021)    Anion gap 15 5 - 15    Comment: Performed at [facility] [address] [place], [place] [address]  Resp panel by RT-PCR (RSV, Flu A&B, Covid) Anterior Nasal Swab     Status: Abnormal   Collection Time: 11/14/22  6:47 PM   Specimen: Anterior Nasal Swab  Result Value Ref Range   SARS Coronavirus 2 by RT PCR POSITIVE (A) NEGATIVE   Influenza A by PCR NEGATIVE NEGATIVE   Influenza B by PCR NEGATIVE NEGATIVE    Comment: (NOTE) The Xpert Xpress SARS-CoV-2/FLU/RSV plus assay is intended as an aid in the diagnosis of influenza from Nasopharyngeal swab specimens and should not be used as a sole basis for treatment. Nasal washings and aspirates are unacceptable for Xpert Xpress SARS-CoV-2/FLU/RSV testing.  Fact Sheet for Patients: EntrepreneurPulse.com.au  Fact Sheet for Healthcare Providers: IncredibleEmployment.be  This test is not yet approved or cleared by the [place] FDA and has been authorized for detection and/or diagnosis of SARS-CoV-2 by FDA under an Emergency Use Authorization (EUA). This EUA will remain in effect (meaning this test can be used) for the duration of the COVID-19 declaration under Section 564(b)(1) of the Act, 21 U.S.C. section 360bbb-3(b)(1), unless the authorization is terminated  or revoked.     Resp Syncytial Virus by PCR NEGATIVE NEGATIVE    Comment: (NOTE) Fact Sheet for Patients: EntrepreneurPulse.com.au  Fact Sheet for Healthcare Providers: IncredibleEmployment.be  This test is not yet approved or cleared by the [place] FDA and has been authorized for detection and/or diagnosis of SARS-CoV-2 by FDA under an Emergency Use Authorization (EUA). This EUA will remain in effect (meaning this test can be used) for the duration of the COVID-19 declaration under Section 564(b)(1) of the Act, 21 U.S.C. section 360bbb-3(b)(1), unless the authorization is terminated or revoked.  Performed at [facility], [place] [address], [place], [place] [address]   Group A Strep by PCR     Status: None   Collection Time: 11/14/22  6:48 PM   Specimen: Throat; Sterile Swab  Result Value Ref Range   Group A Strep by PCR NOT DETECTED NOT DETECTED    Comment: Performed at [facility] [address] [place], [place] [address]  Lactic acid, plasma     Status: Abnormal   Collection Time: 11/14/22  7:30 PM  Result Value Ref Range   Lactic Acid, Venous 2.0 (HH) 0.5 - 1.9 mmol/L    Comment: CRITICAL RESULT CALLED TO, READ BACK BY AND VERIFIED WITH T Ardis Hughs 2049 11/14/2022 WBOND Performed at [facility], [place] [address], [place], [place] [address]   Urinalysis, Routine w reflex microscopic -Urine, Clean Catch     Status: Abnormal   Collection Time: 11/14/22 10:51 PM  Result Value Ref Range   Color, Urine STRAW (A) YELLOW   APPearance CLEAR CLEAR   Specific Gravity, Urine 1.010 1.005 - 1.030   pH 6.0 5.0 - 8.0   Glucose, UA NEGATIVE NEGATIVE mg/dL   Hgb urine dipstick NEGATIVE NEGATIVE   Bilirubin Urine NEGATIVE NEGATIVE   Ketones, ur 5 (A) NEGATIVE mg/dL   Protein, ur 100 (A) NEGATIVE mg/dL   Nitrite NEGATIVE NEGATIVE   Leukocytes,Ua NEGATIVE NEGATIVE   RBC / HPF 0-5 0 - 5 RBC/hpf   WBC, UA 0-5 0 - 5 WBC/hpf    Bacteria, UA NONE SEEN NONE SEEN   Squamous Epithelial / HPF 0-5 0 - 5 /HPF    Comment: Performed at [facility], [place] [address], [place], [place] [address]  Blood culture (routine x 2)     Status: None (Preliminary result)   Collection Time: 11/15/22 12:28 AM   Specimen: BLOOD  Result Value Ref Range   Specimen Description BLOOD SITE NOT SPECIFIED    Special Requests      BOTTLES DRAWN AEROBIC AND ANAEROBIC Blood Culture adequate volume   Culture      NO GROWTH < 12 HOURS Performed at [facility], [place] [address], [place], [place] [address]    Report Status PENDING   Blood culture (routine x 2)     Status: None (Preliminary result)   Collection Time: 11/15/22 12:34 AM   Specimen: BLOOD  Result Value Ref Range   Specimen Description BLOOD SITE NOT SPECIFIED    Special Requests      BOTTLES DRAWN AEROBIC AND ANAEROBIC Blood Culture results may not be optimal due to an excessive volume of blood received in culture bottles   Culture      NO GROWTH < 12 HOURS Performed at [facility], [place] [address], [place], [place] [address]    Report Status PENDING   Lactic acid, plasma     Status: None   Collection Time: 11/15/22  2:09 AM  Result Value Ref Range   Lactic Acid, Venous 1.3 0.5 - 1.9 mmol/L    Comment: Performed at [facility] [address] [place], [place] [address]  Lipase, blood     Status: None   Collection Time: 11/15/22  4:36 AM  Result Value Ref Range   Lipase 32 11 - 51 U/L    Comment: Performed at [facility] [address], [place],  [address]  Comprehensive metabolic panel     Status: Abnormal  Collection Time: 11/15/22  4:36 AM  Result Value Ref Range   Sodium 138 135 - 145 mmol/L   Potassium 3.4 (L) 3.5 - 5.1 mmol/L   Chloride 102 98 - 111 mmol/L   CO2 25 22 - 32 mmol/L   Glucose, Bld 135 (H) 70 - 99 mg/dL    Comment: Glucose reference range applies only to samples taken after fasting for at least 8 hours.   BUN 14  8 - 23 mg/dL   Creatinine, Ser 1.02 (H) 0.44 - 1.00 mg/dL   Calcium 8.5 (L) 8.9 - 10.3 mg/dL   Total Protein 6.4 (L) 6.5 - 8.1 g/dL   Albumin 3.2 (L) 3.5 - 5.0 g/dL   AST 18 15 - 41 U/L   ALT 14 0 - 44 U/L   Alkaline Phosphatase 33 (L) 38 - 126 U/L   Total Bilirubin 0.8 0.3 - 1.2 mg/dL   GFR, Estimated 56 (L) >60 mL/min    Comment: (NOTE) Calculated using the CKD-EPI Creatinine Equation (2021)    Anion gap 11 5 - 15    Comment: Performed at [facility], [place] [address], [place], [place] [address]  CBG monitoring, ED     Status: Abnormal   Collection Time: 11/15/22  7:45 AM  Result Value Ref Range   Glucose-Capillary 143 (H) 70 - 99 mg/dL    Comment: Glucose reference range applies only to samples taken after fasting for at least 8 hours.  CBG monitoring, ED     Status: Abnormal   Collection Time: 11/15/22  1:02 PM  Result Value Ref Range   Glucose-Capillary 136 (H) 70 - 99 mg/dL    Comment: Glucose reference range applies only to samples taken after fasting for at least 8 hours.   CT HEAD WO CONTRAST (5MM)  Result Date: 11/15/2022 CLINICAL DATA:  Acute stroke suspected EXAM: CT HEAD WITHOUT CONTRAST TECHNIQUE: Contiguous axial images were obtained from the base of the skull through the vertex without intravenous contrast. RADIATION DOSE REDUCTION: This exam was performed according to the departmental dose-optimization program which includes automated exposure control, adjustment of the mA and/or kV according to patient size and/or use of iterative reconstruction technique. COMPARISON:  Brain MRI 06/04/2022 FINDINGS: Brain: Remote right ACA distribution infarct affecting the corpus callosum and parasagittal frontoparietal cortex. No evidence of acute infarct, hemorrhage, hydrocephalus, or mass. Generalized cerebral volume loss. Vascular: No hyperdense vessel or unexpected calcification. Skull: Normal. Negative for fracture or focal lesion. Sinuses/Orbits: No acute finding.  IMPRESSION: 1. No acute finding. 2. Remote right ACA distribution infarcts. Electronically Signed   By: Jorje Guild M.D.   On: 11/15/2022 04:13   CT Angio Chest PE W and/or Wo Contrast  Result Date: 11/15/2022 CLINICAL DATA:  Pulmonary embolism (PE) suspected, high prob Patient reports cough and sore throat x 3 days. Usually ambulatory with cane, today required assist of 2 people to stand. Per family she has been incontinent of urine today. EXAM: CT ANGIOGRAPHY CHEST WITH CONTRAST TECHNIQUE: Multidetector CT imaging of the chest was performed using the standard protocol during bolus administration of intravenous contrast. Multiplanar CT image reconstructions and MIPs were obtained to evaluate the vascular anatomy. RADIATION DOSE REDUCTION: This exam was performed according to the departmental dose-optimization program which includes automated exposure control, adjustment of the mA and/or kV according to patient size and/or use of iterative reconstruction technique. CONTRAST:  15m OMNIPAQUE IOHEXOL 350 MG/ML SOLN COMPARISON:  CT chest 12/23/2018 FINDINGS: Cardiovascular: Satisfactory opacification of the pulmonary  arteries to the segmental level. No evidence of pulmonary embolism. Normal heart size. No significant pericardial effusion. The thoracic aorta is normal in caliber. Mild atherosclerotic plaque of the thoracic aorta. No coronary artery calcifications. Mediastinum/Nodes: No enlarged mediastinal, hilar, or axillary lymph nodes. Thyroid gland, trachea, and esophagus demonstrate no significant findings. Lungs/Pleura: Bilateral lower lobe subsegmental atelectasis. No focal consolidation. Subpleural triangular pulmonary micronodules likely intrapulmonary lymph nodes-no further follow-up indicated. No pulmonary mass. No pleural effusion. No pneumothorax. Upper Abdomen: No acute abnormality.  Atherosclerotic plaque. Musculoskeletal: No chest wall abnormality. No suspicious lytic or blastic osseous lesions.  No acute displaced fracture. Multilevel degenerative changes of the spine. Review of the MIP images confirms the above findings. IMPRESSION: 1. No pulmonary embolus. 2. No acute intrathoracic abnormality. Electronically Signed   By: Iven Finn M.D.   On: 11/15/2022 00:30   DG Chest 2 View  Result Date: 11/14/2022 CLINICAL DATA:  shortness of breath, low o2 saturation EXAM: CHEST - 2 VIEW COMPARISON:  CT chest 12/23/2018 FINDINGS: Wireless cardiac device overlies the left chest. The heart and mediastinal contours are within normal limits. Elevated left hemidiaphragm. No focal consolidation. No pulmonary edema. No pleural effusion. No pneumothorax. No acute osseous abnormality. IMPRESSION: No active cardiopulmonary disease. Electronically Signed   By: Iven Finn M.D.   On: 11/14/2022 19:45    Pending Labs Unresulted Labs (From admission, onward)     Start     Ordered   11/15/22 0327  Hemoglobin A1c  Once,   R        11/15/22 0326            Vitals/Pain Today's Vitals   11/15/22 0925 11/15/22 1000 11/15/22 1058 11/15/22 1100  BP: (!) 153/79 (!) 155/64  137/63  Pulse:  86  84  Resp:  18  17  Temp:   99.5 F (37.5 C)   TempSrc:   Oral   SpO2:  93%  92%  Weight:      Height:      PainSc:        Isolation Precautions Airborne and Contact precautions  Medications Medications  ondansetron (ZOFRAN) injection 4 mg (4 mg Intravenous Given 11/15/22 0922)  nirmatrelvir/ritonavir (renal dosing) (PAXLOVID) 2 tablet (2 tablets Oral Given 11/15/22 1101)  insulin aspart (novoLOG) injection 0-9 Units ( Subcutaneous Not Given 11/15/22 1323)  insulin aspart (novoLOG) injection 0-5 Units (has no administration in time range)  aspirin EC tablet 81 mg (81 mg Oral Given 11/15/22 0925)  lisinopril (ZESTRIL) tablet 10 mg (10 mg Oral Given 11/15/22 0925)  0.9 %  sodium chloride infusion ( Intravenous New Bag/Given 11/15/22 0922)  atorvastatin (LIPITOR) tablet 20 mg (has no administration in time  range)  benzonatate (TESSALON) capsule 100 mg (100 mg Oral Given 11/15/22 1101)  guaiFENesin-dextromethorphan (ROBITUSSIN DM) 100-10 MG/5ML syrup 5 mL (has no administration in time range)  magic mouthwash w/lidocaine (has no administration in time range)  sodium chloride 0.9 % bolus 1,000 mL (0 mLs Intravenous Stopped 11/14/22 2055)  acetaminophen (TYLENOL) tablet 650 mg (650 mg Oral Given 11/14/22 1958)  lidocaine (XYLOCAINE) 2 % viscous mouth solution 15 mL (15 mLs Mouth/Throat Given 11/14/22 2021)  HYDROcodone-acetaminophen (HYCET) 7.5-325 mg/15 ml solution 10 mL (10 mLs Oral Given 11/14/22 2346)  iohexol (OMNIPAQUE) 350 MG/ML injection 65 mL (65 mLs Intravenous Contrast Given 11/14/22 2320)    Mobility walks with person assist     Focused Assessments Neuro Assessment Handoff:  Swallow screen pass? Yes  Neuro Assessment: Exceptions to [facility] Neuro Checks:      Has TPA been given? No If patient is a Neuro Trauma and patient is going to OR before floor call report to [place] nurse: [phone] or [phone]   R Recommendations: See Admitting Provider Note  Report given to:   Additional Notes:

## 2022-11-15 NOTE — ED Notes (Signed)
Pt able to ambulate to restroom at this time.  O2 saturation noted to remain 91-94% during ambulation.

## 2022-11-15 NOTE — Evaluation (Signed)
Physical Therapy Evaluation Patient Details Name: Beth Macdonald MRN: [id number] DOB: 1945/11/23 Today's Date: 11/15/2022  History of Present Illness  77 y.o. female presented with sore throat, cough, low-grade fever and generalized weakness. Covid +. PMH significant for DM2, HTN, HLD, stroke, aortic atherosclerosis, fibromuscular hyperplasia of artery, diabetic neuropathy, history of DVT, osteoarthritis, right-sided facial nerve palsy  Clinical Impression  Pt admitted with above diagnosis and presents to PT with functional limitations due to deficits listed below (See PT problem list). Pt needs skilled PT to maximize independence and safety to allow discharge to home with wife and HHPT. Expect steady progress as pt regains strength and stamina.         Recommendations for follow up therapy are one component of a multi-disciplinary discharge planning process, led by the attending physician.  Recommendations may be updated based on patient status, additional functional criteria and insurance authorization.  Follow Up Recommendations Home health PT      Assistance Recommended at Discharge Intermittent Supervision/Assistance  Patient can return home with the following  Help with stairs or ramp for entrance;A little help with walking and/or transfers;Assist for transportation;Assistance with cooking/housework    Equipment Recommendations None recommended by PT  Recommendations for Other Services       Functional Status Assessment Patient has had a recent decline in their functional status and demonstrates the ability to make significant improvements in function in a reasonable and predictable amount of time.     Precautions / Restrictions Precautions Precautions: Fall      Mobility  Bed Mobility Overal bed mobility: Needs Assistance Bed Mobility: Supine to Sit, Sit to Supine     Supine to sit: Supervision          Transfers Overall transfer level: Needs  assistance Equipment used: Rolling walker (2 wheels) Transfers: Sit to/from Stand Sit to Stand: Min assist, Min guard           General transfer comment: Min assist on initial stand due to posterior bias. Min guard for safety on subsequent stands    Ambulation/Gait Ambulation/Gait assistance: Min guard Gait Distance (Feet): 100 Feet Assistive device: Rolling walker (2 wheels) Gait Pattern/deviations: Step-through pattern, Decreased stride length Gait velocity: decr Gait velocity interpretation: <1.31 ft/sec, indicative of household ambulator   General Gait Details: Improved fluidity and stability with incr distance. Assist for safety  Stairs            Wheelchair Mobility    Modified Rankin (Stroke Patients Only)       Balance Overall balance assessment: History of Falls, Needs assistance Sitting-balance support: No upper extremity supported, Feet supported Sitting balance-Leahy Scale: Good     Standing balance support: Bilateral upper extremity supported Standing balance-Leahy Scale: Poor Standing balance comment: walker and min guard for static standing                             Pertinent Vitals/Pain Pain Assessment Pain Assessment: Faces Faces Pain Scale: Hurts little more Pain Location: throat Pain Descriptors / Indicators: Sore Pain Intervention(s): Limited activity within patient's tolerance    Home Living Family/patient expects to be discharged to:: Private residence Living Arrangements: Spouse/significant other Beth Macdonald - spouse) Available Help at Discharge: Family;Available 24 hours/day Type of Home: House Home Access: Level entry       Home Layout: One level Home Equipment: Shower seat - built in;Grab bars - tub/shower;Rolling Walker (2 wheels);Cane - single point  Prior Function Prior Level of Function : Independent/Modified Independent;Driving             Mobility Comments: Uses cane outside and furniture walks        Hand Dominance   Dominant Hand: Right    Extremity/Trunk Assessment   Upper Extremity Assessment Upper Extremity Assessment: Defer to OT evaluation    Lower Extremity Assessment Lower Extremity Assessment: Generalized weakness    Cervical / Trunk Assessment Cervical / Trunk Assessment: Normal;Other exceptions (forward head)  Communication   Communication: No difficulties  Cognition Arousal/Alertness: Awake/alert Behavior During Therapy: WFL for tasks assessed/performed Overall Cognitive Status: History of cognitive impairments - at baseline (per wife, she has some mildchanges in cognition since her CVA, however feels she is at her baseline)                                          General Comments General comments (skin integrity, edema, etc.): VSS on RA    Exercises     Assessment/Plan    PT Assessment Patient needs continued PT services  PT Problem List Decreased strength;Decreased activity tolerance;Decreased balance;Decreased mobility       PT Treatment Interventions DME instruction;Gait training;Functional mobility training;Therapeutic activities;Therapeutic exercise;Balance training;Patient/family education;Stair training    PT Goals (Current goals can be found in the Care Plan section)  Acute Rehab PT Goals Patient Stated Goal: return home PT Goal Formulation: With patient Time For Goal Achievement: 11/29/22 Potential to Achieve Goals: Good    Frequency Min 3X/week     Co-evaluation               AM-PAC PT "6 Clicks" Mobility  Outcome Measure Help needed turning from your back to your side while in a flat bed without using bedrails?: None Help needed moving from lying on your back to sitting on the side of a flat bed without using bedrails?: A Little Help needed moving to and from a bed to a chair (including a wheelchair)?: A Little Help needed standing up from a chair using your arms (e.g., wheelchair or bedside chair)?:  A Little Help needed to walk in hospital room?: A Little Help needed climbing 3-5 steps with a railing? : A Little 6 Click Score: 19    End of Session   Activity Tolerance: Patient tolerated treatment well Patient left: in bed;with call bell/phone within reach;with family/visitor present   PT Visit Diagnosis: Other abnormalities of gait and mobility (R26.89);Muscle weakness (generalized) (M62.81)    Time: [id number] PT Time Calculation (min) (ACUTE ONLY): 17 min   Charges:   PT Evaluation $PT Eval Low Complexity: [place] Office [place] 11/15/2022, 4:40 PM

## 2022-11-15 NOTE — Evaluation (Signed)
Occupational Therapy Evaluation Patient Details Name: Beth Macdonald MRN: [id number] DOB: 1946/05/19 Today's Date: 11/15/2022   History of Present Illness 77 y.o. female presented with sore throat, cough, low-grade fever and generalized weakness. Covid +. PMH significant for DM2, HTN, HLD, stroke, aortic atherosclerosis, fibromuscular hyperplasia of artery, diabetic neuropathy, history of DVT, osteoarthritis, right-sided facial nerve palsy   Clinical Impression   PTA pt lives at home independently with her wife with occasional use of AD in the community. Currently requires min A with mobility and ADL tasks @ RW level. VSS on RA throughout session. Began education on importance of mobility and use of incentive spirometer to facilitate safe DC home with her spouse with [place]. Acute OT to follow.      Recommendations for follow up therapy are one component of a multi-disciplinary discharge planning process, led by the attending physician.  Recommendations may be updated based on patient status, additional functional criteria and insurance authorization.   Follow Up Recommendations  Home health OT     Assistance Recommended at Discharge Frequent or constant Supervision/Assistance  Patient can return home with the following A little help with walking and/or transfers;A little help with bathing/dressing/bathroom;Assistance with cooking/housework;Direct supervision/assist for medications management;Assist for transportation;Help with stairs or ramp for entrance    Functional Status Assessment  Patient has had a recent decline in their functional status and demonstrates the ability to make significant improvements in function in a reasonable and predictable amount of time.  Equipment Recommendations  None recommended by OT    Recommendations for Other Services       Precautions / Restrictions Precautions Precautions: Fall      Mobility Bed Mobility Overal bed mobility: Needs  Assistance Bed Mobility: Supine to Sit     Supine to sit: Supervision          Transfers Overall transfer level: Needs assistance Equipment used: Standard walker Transfers: Sit to/from Stand Sit to Stand: Min assist           General transfer comment: initial posterior sway, however improved with repetitioin      Balance Overall balance assessment: History of Falls                                         ADL either performed or assessed with clinical judgement   ADL Overall ADL's : Needs assistance/impaired     Grooming: Set up   Upper Body Bathing: Set up;Sitting   Lower Body Bathing: Min guard;Sit to/from stand   Upper Body Dressing : Set up;Sitting   Lower Body Dressing: Minimal assistance;Sit to/from stand   Toilet Transfer: Min guard;Ambulation   Toileting- Clothing Manipulation and Hygiene: Min guard;Sit to/from stand       Functional mobility during ADLs: Minimal assistance;Rolling walker (2 wheels);Cueing for safety (Progressed to minguard A)       Vision Baseline Vision/History: 1 Wears glasses       Perception     Praxis      Pertinent Vitals/Pain Pain Assessment Pain Assessment: Faces Faces Pain Scale: Hurts little more Pain Location: throat Pain Descriptors / Indicators: Sore Pain Intervention(s): Limited activity within patient's tolerance     Hand Dominance Right   Extremity/Trunk Assessment Upper Extremity Assessment Upper Extremity Assessment: Overall WFL for tasks assessed   Lower Extremity Assessment Lower Extremity Assessment: Defer to PT evaluation   Cervical / Trunk Assessment Cervical /  Trunk Assessment: Normal;Other exceptions (forward head)   Communication Communication Communication: No difficulties   Cognition Arousal/Alertness: Awake/alert Behavior During Therapy: WFL for tasks assessed/performed Overall Cognitive Status: History of cognitive impairments - at baseline (per wife, she has  some changes in cognition since her CVA, however feels she is at her baseline)                                       General Comments  SpO2 above 90 on RA; VSS; recently went to women's [facility] tournament    Exercises Exercises: Other exercises Other Exercises Other Exercises: incentive spirometerx 10 - pulls 261m Other Exercises: general BUE/LE bed level exercise   Shoulder Instructions      Home Living Family/patient expects to be discharged to:: Private residence Living Arrangements: Spouse/significant other (Santiago Glad- spouse) Available Help at Discharge: Family;Available 24 hours/day Type of Home: House Home Access: Level entry     Home Layout: One level     Bathroom Shower/Tub: Tub/shower unit;Walk-in shower   Bathroom Toilet: Standard Bathroom Accessibility: Yes How Accessible: Accessible via walker Home Equipment: Shower seat - built in;Grab bars - tub/shower;Rolling Walker (2 wheels);Cane - single point          Prior Functioning/Environment Prior Level of Function : Independent/Modified Independent;Driving             Mobility Comments: Uses cane outside and furniture walks          OT Problem List: Decreased strength;Decreased activity tolerance;Impaired balance (sitting and/or standing);Decreased safety awareness;Decreased knowledge of use of DME or AE;Cardiopulmonary status limiting activity;Pain      OT Treatment/Interventions: Self-care/ADL training;Therapeutic exercise;Energy conservation;DME and/or AE instruction;Therapeutic activities;Patient/family education;Balance training    OT Goals(Current goals can be found in the care plan section) Acute Rehab OT Goals Patient Stated Goal: to feel better OT Goal Formulation: With patient Time For Goal Achievement: 11/29/22 Potential to Achieve Goals: Good  OT Frequency: Min 2X/week    Co-evaluation              AM-PAC OT "6 Clicks" Daily Activity     Outcome Measure Help from  another person eating meals?: None Help from another person taking care of personal grooming?: A Little Help from another person toileting, which includes using toliet, bedpan, or urinal?: A Little Help from another person bathing (including washing, rinsing, drying)?: A Little Help from another person to put on and taking off regular upper body clothing?: A Little Help from another person to put on and taking off regular lower body clothing?: A Little 6 Click Score: 19   End of Session Equipment Utilized During Treatment: Rolling walker (2 wheels) Nurse Communication: Mobility status  Activity Tolerance: Patient tolerated treatment well Patient left: in bed;with call bell/phone within reach;with family/visitor present  OT Visit Diagnosis: Unsteadiness on feet (R26.81);History of falling (Z91.81);Muscle weakness (generalized) (M62.81);Pain Pain - part of body:  (throat)                Time: [id number] Time Calculation (min): 18 min Charges:  OT General Charges $OT Visit: 1 Visit OT Evaluation $OT Eval Moderate Complexity: 1[place] OT/L   Acute OT Clinical Specialist Acute Rehabilitation Services Pager [phone] Office [phone]  [facility]3/14/2024, 1:48 PM

## 2022-11-15 NOTE — Progress Notes (Signed)
PROGRESS NOTE  Beth Macdonald  DOB: 01/13/1946  PCP: [name], MD [id number]  DOA: 11/14/2022  LOS: 0 days  Hospital Day: 2  Brief narrative: Beth Macdonald is a 77 y.o. female with PMH significant for DM2, HTN, HLD, stroke, aortic atherosclerosis, fibromuscular hyperplasia of artery, diabetic neuropathy, history of DVT, osteoarthritis, right-sided facial nerve palsy 3/13, patient presented to the ED from home with complaint of cough, sore throat, low-grade fever generalized weakness for 3 days.  In the ED, patient was afebrile but tachycardic to 100s, O2 sat 91% on room air Labs with normal WC count, lactic acid elevated to 2 COVID PCR positive, group A strep PCR negative. CT chest angio was negative for PE or pneumonia. Patient was started on IV fluid, Paxlovid and supportive care Admitted to [facility]   Subjective: Patient was seen and examined this morning.  Pleasant elderly Caucasian female.  Lying on bed.  Her biggest concern at this time is sore throat and cough. Partner at bedside. Chart reviewed. Overnight, heart rate improving.  Blood pressure 150s this morning, breathing on room air Last set of labs from this morning with potassium low at 3.4, BUN/creatinine 14/1.02  Assessment and plan: SIRS - POA COVID infection Presented with sore throat, cough, low-grade fever and generalized weakness for 3 days Was tachycardic and had slightly elevated lactic acid level but WC count was normal and chest imaging negative for pneumonia. She met SIRS criteria due to viral illness and dehydration.  Sepsis ruled out. With IV hydration, lactic acid level improved. Currently on a 5-day course of Paxlovid. Not hypoxic and hence not on steroids. Her biggest complaint this morning is sore throat and cough.  For sore throat, added Magic mouthwash with lidocaine PRN.  For cough, I added scheduled benzonatate and PRN Robitussin.  Check ambulatory oxygen requirement. Recent Labs  Lab  11/14/22 1810 11/14/22 1930 11/15/22 0209  WBC 9.5  --   --   LATICACIDVEN  --  2.0* 1.3   Generalized weakness Secondary to viral illness and muscle deconditioning.  Family reports he could barely walk at home. CT head on admission was unremarkable for acute finding.  It showed remote ACA distribution infarct. Fall precautions.  PT consulted  Nausea/vomiting/dehydration Secondary to COVID.  Continue maintenance IV fluid with NS 75 mill per hour for next 24 hours..   Encourage oral intake.  PRN IV antiemetics.  Type 2 diabetes mellitus A1c 6 in 2023.  Repeat A1c PTA on Ozempic weekly, metformin 1000 mg twice daily, Currently on sliding scale insulin with Accu-Cheks Lab Results  Component Value Date   HGBA1C 6.0 (H) 06/04/2022   Recent Labs  Lab 11/15/22 0745  GLUCAP 143*   Essential hypertension PTA on lisinopril 10 mg daily. Blood pressure in 150s this morning.  Renal function normal.  Resume lisinopril.  History of CVA Hyperlipidemia CT head showed remote ACA distribution infarct.  No deficit.   Currently has a loop recorder in place. Continue aspirin 81 mg daily, Lipitor 20 mg daily, Repatha as an outpatient   Mobility: PT eval ordered  Goals of care   Code Status: DNR.  Confirmed with the patient   DVT prophylaxis:  SCDs Start: 11/15/22 0314   Antimicrobials: None Fluid: Start NS at 75 mill per hour for next 24 hours. Consultants: None Family Communication: partner at bedside  Status: Observation Level of care:  Telemetry Medical   Needs to continue in-hospital care:  Continues to have COVID symptoms, dehydration  Prognosis:  Depends on recovery out of COVID infection.  If improved, good prognosis  Patient from: Home Anticipated d/c to: Home in 1 to 2 days     Scheduled Meds:  aspirin EC  81 mg Oral Daily   [START ON 11/20/2022] atorvastatin  20 mg Oral Daily   benzonatate  100 mg Oral TID   insulin aspart  0-5 Units Subcutaneous QHS    insulin aspart  0-9 Units Subcutaneous TID WC   lisinopril  10 mg Oral Daily   nirmatrelvir/ritonavir (renal dosing)  2 tablet Oral BID    PRN meds: guaiFENesin-dextromethorphan, magic mouthwash w/lidocaine, ondansetron (ZOFRAN) IV   Infusions:   sodium chloride 75 mL/hr at 11/15/22 [id number]    Diet:  Diet Order             Diet heart healthy/carb modified Room service appropriate? Yes; Fluid consistency: Thin  Diet effective now                   Antimicrobials: Anti-infectives (From admission, onward)    Start     Dose/Rate Route Frequency Ordered Stop   11/14/22 2300  nirmatrelvir/ritonavir (renal dosing) (PAXLOVID) 2 tablet        2 tablet Oral 2 times daily 11/14/22 2236 11/19/22 2159       Skin assessment:       Nutritional status:  Body mass index is 26.61 kg/m.          Objective: Vitals:   11/15/22 1000 11/15/22 1058  BP: (!) 155/64   Pulse: 86   Resp: 18   Temp:  99.5 F (37.5 C)  SpO2: 93%     Intake/Output Summary (Last 24 hours) at 11/15/2022 1101 Last data filed at 11/14/2022 2055 Gross per 24 hour  Intake 1065.6 ml  Output --  Net 1065.6 ml   Filed Weights   11/14/22 1807  Weight: 79.4 kg   Weight change:  Body mass index is 26.61 kg/m.   Physical Exam: General exam: Pleasant, elderly female. Skin: No rashes, lesions or ulcers. HEENT: Atraumatic, normocephalic, no obvious bleeding.  No lesions in oral exam Lungs: Clear to auscultation bilaterally.  Somewhat cough on deep breathing CVS: Regular rate and rhythm, no murmur GI/Abd soft, nontender, nondistended, bowel sound present CNS: Alert, awake, oriented x 3 Psychiatry: Mood appropriate Extremities: No pedal edema, no calf tenderness  Data Review: I have personally reviewed the laboratory data and studies available.  F/u labs ordered Unresulted Labs (From admission, onward)     Start     Ordered   11/15/22 0327  Hemoglobin A1c  Once,   R        11/15/22 0326    11/14/22 2115  Blood culture (routine x 2)  BLOOD CULTURE X 2,   R      11/14/22 2114            Total time spent in review of labs and imaging, patient evaluation, formulation of plan, documentation and communication with family: 65 minutes  Signed, Terrilee Croak, MD [facility] 11/15/2022

## 2022-11-16 DIAGNOSIS — R112 Nausea with vomiting, unspecified: Secondary | ICD-10-CM | POA: Diagnosis not present

## 2022-11-16 DIAGNOSIS — R531 Weakness: Secondary | ICD-10-CM

## 2022-11-16 DIAGNOSIS — I10 Essential (primary) hypertension: Secondary | ICD-10-CM

## 2022-11-16 DIAGNOSIS — E785 Hyperlipidemia, unspecified: Secondary | ICD-10-CM

## 2022-11-16 DIAGNOSIS — U071 COVID-19: Secondary | ICD-10-CM | POA: Diagnosis not present

## 2022-11-16 DIAGNOSIS — Z8673 Personal history of transient ischemic attack (TIA), and cerebral infarction without residual deficits: Secondary | ICD-10-CM

## 2022-11-16 LAB — HEMOGLOBIN A1C
Hgb A1c MFr Bld: 6.7 % — ABNORMAL HIGH (ref 4.8–5.6)
Mean Plasma Glucose: 146 mg/dL

## 2022-11-16 LAB — GLUCOSE, CAPILLARY
Glucose-Capillary: 123 mg/dL — ABNORMAL HIGH (ref 70–99)
Glucose-Capillary: 114 mg/dL — ABNORMAL HIGH (ref 70–99)

## 2022-11-16 MED ORDER — NIRMATRELVIR/RITONAVIR (PAXLOVID) TABLET (RENAL DOSING): 2.0000 | ORAL_TABLET | Freq: Two times a day (BID) | ORAL | 0 refills | Status: AC

## 2022-11-16 MED ORDER — HYDRALAZINE HCL 20 MG/ML IJ SOLN
5.0000 mg | Freq: Four times a day (QID) | INTRAMUSCULAR | Status: DC | PRN
  Administered 2022-11-16: 5 mg via INTRAVENOUS
  Filled 2022-11-16: qty 1

## 2022-11-16 MED ORDER — BENZONATATE 100 MG PO CAPS: 100.0000 mg | ORAL_CAPSULE | Freq: Three times a day (TID) | ORAL | 0 refills | Status: DC

## 2022-11-16 MED ORDER — ATORVASTATIN CALCIUM 20 MG PO TABS: 20.0000 mg | ORAL_TABLET | Freq: Every day | ORAL | Status: AC

## 2022-11-16 NOTE — Discharge Summary (Signed)
Physician Discharge Summary   Patient: Beth Macdonald MRN: [id number] DOB: 1946/07/23  Admit date:     11/14/2022  Discharge date: 11/16/22  Discharge Physician: Lorella Nimrod   PCP: [name], MD   Recommendations at discharge:  Please obtain CBC and CMP in 1 week Please ensure the completion of the course of Paxlovid Patient was instructed to hold Lipitor, she can resume after finishing Paxlovid. Follow-up with primary care provider within a week  Discharge Diagnoses: Principal Problem:   COVID-19 virus infection Active Problems:   Dyslipidemia   History of stroke   Essential hypertension   Sepsis ([place])   Emesis   Metabolic acidosis   Weakness   Hospital Course: Beth Macdonald is a 77 y.o. female with PMH significant for DM2, HTN, HLD, stroke, aortic atherosclerosis, fibromuscular hyperplasia of artery, diabetic neuropathy, history of DVT, osteoarthritis, right-sided facial nerve palsy, patient presented to the ED from home with complaint of cough, sore throat, low-grade fever generalized weakness for 3 days.  She was found to be positive for COVID-19 infection.  Group A strep PCR was negative.  CTA chest was negative for PE or pneumonia.  Patient received IV fluid, Paxlovid and supportive care during current hospitalization.  Patient did had some SIRS criteria on admission secondary to COVID infection, sepsis ruled out  She was started on Paxlovid and will complete a 5-day course.  There was no hypoxia so she did not receive any steroid.  Patient need to hold Lipitor while on Paxlovid and can resume after finishing that course.   Our physical therapist evaluated her for complaint of generalized weakness and recommended home health services which were ordered.  Nausea and vomiting improved.  Sore throat improving.  Patient will continue on her current medications and need to have a close follow-up with her providers for further recommendations.  Consultants:  None Procedures performed: None Disposition: Home health Diet recommendation:  Discharge Diet Orders (From admission, onward)     Start     Ordered   11/16/22 0000  Diet - low sodium heart healthy        11/16/22 1027           Cardiac and Carb modified diet DISCHARGE MEDICATION: Allergies as of 11/16/2022       Reactions   Clindamycin/lincomycin Other (See Comments)   Cannot tolerate mycins causes C-Diff   Milk-related Compounds Diarrhea   Tetracyclines & Related Other (See Comments)   "Feel really nervous"        Medication List     TAKE these medications    aspirin EC 81 MG tablet Take 1 tablet (81 mg total) by mouth daily. Swallow whole.   atorvastatin 20 MG tablet Commonly known as: LIPITOR Take 1 tablet (20 mg total) by mouth daily. Hold until you are taking Paxlovid What changed: additional instructions   benzonatate 100 MG capsule Commonly known as: TESSALON Take 1 capsule (100 mg total) by mouth 3 (three) times daily.   Clobetasol Prop Emollient Base 0.05 % emollient cream Commonly known as: Clobetasol Propionate E Apply 1 Application topically 2 (two) times daily. What changed:  when to take this reasons to take this   cycloSPORINE 0.05 % ophthalmic emulsion Commonly known as: RESTASIS Place 1 drop into both eyes 2 (two) times daily.   desonide 0.05 % cream Commonly known as: DESOWEN Apply topically 2 (two) times daily as needed (Rash).   ibuprofen 800 MG tablet Commonly known as: ADVIL Take 1 tablet (800  mg total) by mouth every 6 (six) hours as needed for mild pain or moderate pain.   lisinopril 10 MG tablet Commonly known as: ZESTRIL Take 10 mg by mouth daily.   metFORMIN 500 MG tablet Commonly known as: GLUCOPHAGE Take 1,000 mg by mouth 2 (two) times daily with a meal.   nirmatrelvir/ritonavir (renal dosing) 10 x 150 MG & 10 x 100MG  Tabs Commonly known as: PAXLOVID Take 2 tablets by mouth 2 (two) times daily for 5 days. Patient  GFR is >60. Take nirmatrelvir (150 mg) one tablet twice daily for 5 days and ritonavir (100 mg) one tablet twice daily for 5 days.   ondansetron 4 MG tablet Commonly known as: ZOFRAN Take 1 tablet (4 mg total) by mouth every 6 (six) hours as needed for nausea.   Repatha SureClick [id number] MG/ML Soaj Generic drug: Evolocumab Inject 140 mg into the skin every 14 (fourteen) days. Sunday   Semaglutide(0.25 or 0.5MG /DOS) 2 MG/3ML Sopn Inject 0.5 mg into the skin once a week. Tuesday   SYSTANE FREE OP Apply 1 drop to eye daily as needed (dryness/irritation).   TUMS PO Take 1 tablet by mouth daily as needed (acid control).        Follow-up Information     [name], MD. Schedule an appointment as soon as possible for a visit in 1 week(s).   Specialty: Internal Medicine Contact information: [address] [place] [address] [phone]                Discharge Exam: Danley Danker Weights   11/14/22 1807  Weight: 79.4 kg   General.  Frail elderly lady, in no acute distress. Pulmonary.  Lungs clear bilaterally, normal respiratory effort. CV.  Regular rate and rhythm, no JVD, rub or murmur. Abdomen.  Soft, nontender, nondistended, BS positive. CNS.  Alert and oriented .  No focal neurologic deficit. Extremities.  No edema, no cyanosis, pulses intact and symmetrical. Psychiatry.  Judgment and insight appears normal.   Condition at discharge: stable  The results of significant diagnostics from this hospitalization (including imaging, microbiology, ancillary and laboratory) are listed below for reference.   Imaging Studies: CT HEAD WO CONTRAST (5MM)  Result Date: 11/15/2022 CLINICAL DATA:  Acute stroke suspected EXAM: CT HEAD WITHOUT CONTRAST TECHNIQUE: Contiguous axial images were obtained from the base of the skull through the vertex without intravenous contrast. RADIATION DOSE REDUCTION: This exam was performed according to the departmental  dose-optimization program which includes automated exposure control, adjustment of the mA and/or kV according to patient size and/or use of iterative reconstruction technique. COMPARISON:  Brain MRI 06/04/2022 FINDINGS: Brain: Remote right ACA distribution infarct affecting the corpus callosum and parasagittal frontoparietal cortex. No evidence of acute infarct, hemorrhage, hydrocephalus, or mass. Generalized cerebral volume loss. Vascular: No hyperdense vessel or unexpected calcification. Skull: Normal. Negative for fracture or focal lesion. Sinuses/Orbits: No acute finding. IMPRESSION: 1. No acute finding. 2. Remote right ACA distribution infarcts. Electronically Signed   By: Jorje Guild M.D.   On: 11/15/2022 04:13   CT Angio Chest PE W and/or Wo Contrast  Result Date: 11/15/2022 CLINICAL DATA:  Pulmonary embolism (PE) suspected, high prob Patient reports cough and sore throat x 3 days. Usually ambulatory with cane, today required assist of 2 people to stand. Per family she has been incontinent of urine today. EXAM: CT ANGIOGRAPHY CHEST WITH CONTRAST TECHNIQUE: Multidetector CT imaging of the chest was performed using the standard protocol during bolus administration of intravenous contrast. Multiplanar  CT image reconstructions and MIPs were obtained to evaluate the vascular anatomy. RADIATION DOSE REDUCTION: This exam was performed according to the departmental dose-optimization program which includes automated exposure control, adjustment of the mA and/or kV according to patient size and/or use of iterative reconstruction technique. CONTRAST:  71mL OMNIPAQUE IOHEXOL 350 MG/ML SOLN COMPARISON:  CT chest 12/23/2018 FINDINGS: Cardiovascular: Satisfactory opacification of the pulmonary arteries to the segmental level. No evidence of pulmonary embolism. Normal heart size. No significant pericardial effusion. The thoracic aorta is normal in caliber. Mild atherosclerotic plaque of the thoracic aorta. No  coronary artery calcifications. Mediastinum/Nodes: No enlarged mediastinal, hilar, or axillary lymph nodes. Thyroid gland, trachea, and esophagus demonstrate no significant findings. Lungs/Pleura: Bilateral lower lobe subsegmental atelectasis. No focal consolidation. Subpleural triangular pulmonary micronodules likely intrapulmonary lymph nodes-no further follow-up indicated. No pulmonary mass. No pleural effusion. No pneumothorax. Upper Abdomen: No acute abnormality.  Atherosclerotic plaque. Musculoskeletal: No chest wall abnormality. No suspicious lytic or blastic osseous lesions. No acute displaced fracture. Multilevel degenerative changes of the spine. Review of the MIP images confirms the above findings. IMPRESSION: 1. No pulmonary embolus. 2. No acute intrathoracic abnormality. Electronically Signed   By: Iven Finn M.D.   On: 11/15/2022 00:30   DG Chest 2 View  Result Date: 11/14/2022 CLINICAL DATA:  shortness of breath, low o2 saturation EXAM: CHEST - 2 VIEW COMPARISON:  CT chest 12/23/2018 FINDINGS: Wireless cardiac device overlies the left chest. The heart and mediastinal contours are within normal limits. Elevated left hemidiaphragm. No focal consolidation. No pulmonary edema. No pleural effusion. No pneumothorax. No acute osseous abnormality. IMPRESSION: No active cardiopulmonary disease. Electronically Signed   By: Iven Finn M.D.   On: 11/14/2022 19:45    Microbiology: Results for orders placed or performed during the hospital encounter of 11/14/22  Resp panel by RT-PCR (RSV, Flu A&B, Covid) Anterior Nasal Swab     Status: Abnormal   Collection Time: 11/14/22  6:47 PM   Specimen: Anterior Nasal Swab  Result Value Ref Range Status   SARS Coronavirus 2 by RT PCR POSITIVE (A) NEGATIVE Final   Influenza A by PCR NEGATIVE NEGATIVE Final   Influenza B by PCR NEGATIVE NEGATIVE Final    Comment: (NOTE) The Xpert Xpress SARS-CoV-2/FLU/RSV plus assay is intended as an aid in the  diagnosis of influenza from Nasopharyngeal swab specimens and should not be used as a sole basis for treatment. Nasal washings and aspirates are unacceptable for Xpert Xpress SARS-CoV-2/FLU/RSV testing.  Fact Sheet for Patients: EntrepreneurPulse.com.au  Fact Sheet for Healthcare Providers: IncredibleEmployment.be  This test is not yet approved or cleared by the [place] FDA and has been authorized for detection and/or diagnosis of SARS-CoV-2 by FDA under an Emergency Use Authorization (EUA). This EUA will remain in effect (meaning this test can be used) for the duration of the COVID-19 declaration under Section 564(b)(1) of the Act, 21 U.S.C. section 360bbb-3(b)(1), unless the authorization is terminated or revoked.     Resp Syncytial Virus by PCR NEGATIVE NEGATIVE Final    Comment: (NOTE) Fact Sheet for Patients: EntrepreneurPulse.com.au  Fact Sheet for Healthcare Providers: IncredibleEmployment.be  This test is not yet approved or cleared by the [place] FDA and has been authorized for detection and/or diagnosis of SARS-CoV-2 by FDA under an Emergency Use Authorization (EUA). This EUA will remain in effect (meaning this test can be used) for the duration of the COVID-19 declaration under Section 564(b)(1) of the Act, 21 U.S.C. section 360bbb-3(b)(1), unless the authorization  is terminated or revoked.  Performed at [facility], [place] [address], [place], [place] [address]   Group A Strep by PCR     Status: None   Collection Time: 11/14/22  6:48 PM   Specimen: Throat; Sterile Swab  Result Value Ref Range Status   Group A Strep by PCR NOT DETECTED NOT DETECTED Final    Comment: Performed at [facility], [place] [address] [place], [place] [address]  Blood culture (routine x 2)     Status: None (Preliminary result)   Collection Time: 11/15/22 12:28 AM   Specimen: BLOOD  Result Value  Ref Range Status   Specimen Description BLOOD SITE NOT SPECIFIED  Final   Special Requests   Final    BOTTLES DRAWN AEROBIC AND ANAEROBIC Blood Culture adequate volume   Culture   Final    NO GROWTH 1 DAY Performed at [facility], [place] [address], [place], [place] [address]    Report Status PENDING  Incomplete  Blood culture (routine x 2)     Status: None (Preliminary result)   Collection Time: 11/15/22 12:34 AM   Specimen: BLOOD  Result Value Ref Range Status   Specimen Description BLOOD SITE NOT SPECIFIED  Final   Special Requests   Final    BOTTLES DRAWN AEROBIC AND ANAEROBIC Blood Culture results may not be optimal due to an excessive volume of blood received in culture bottles   Culture   Final    NO GROWTH 1 DAY Performed at [facility], [place] [address], [place], [place] [address]    Report Status PENDING  Incomplete    Labs: CBC: Recent Labs  Lab 11/14/22 1810  WBC 9.5  HGB 13.1  HCT 40.2  MCV 95.3  PLT 0000000   Basic Metabolic Panel: Recent Labs  Lab 11/14/22 1810 11/15/22 0436  NA 137 138  K 3.9 3.4*  CL 102 102  CO2 20* 25  GLUCOSE 164* 135*  BUN 17 14  CREATININE 1.25* 1.02*  CALCIUM 8.8* 8.5*   Liver Function Tests: Recent Labs  Lab 11/15/22 0436  AST 18  ALT 14  ALKPHOS 33*  BILITOT 0.8  PROT 6.4*  ALBUMIN 3.2*   CBG: Recent Labs  Lab 11/15/22 0745 11/15/22 1302 11/15/22 1647 11/15/22 2128 11/16/22 0804  GLUCAP 143* 136* 81 134* 123*    Discharge time spent: greater than 30 minutes.  This record has been created using Systems analyst. Errors have been sought and corrected,but may not always be located. Such creation errors do not reflect on the standard of care.   Signed: Lorella Nimrod, MD [facility] 11/16/2022

## 2022-11-16 NOTE — TOC Initial Note (Signed)
Transition of Care [facility]) - Initial/Assessment Note    Patient Details  Name: Beth Macdonald MRN: [id number] Date of Birth: 10-17-1945  [facility] CM/SW Contact:    Levonne Lapping, RN Phone Number: 11/16/2022, 10:14 AM  Clinical Narrative:       CM spoke with patient telephonically CM working remotely. Patient lives at home with Spouse. Patient states she was independent PTA. Still works. PCP is established.   Recommendations have already been made for [facility] PT and OT. Patient had received services in the past from [place]. CM has contacted [facility] and they will again provide services for this patient at dc. Spouse to transport at dc.   TOC will continue to follow patient for any additional discharge needs    Expected Discharge Plan: [place] Barriers to Discharge: Continued Medical Work up   Patient Goals and CMS Choice Patient states their goals for this hospitalization and ongoing recovery are:: Feel better and go home CMS Medicare.gov Compare Post Acute Care list provided to:: Other (Comment Required) (N/A  [facility] provided services previously and patient wants to use them again) Choice offered to / list presented to : NA      Expected Discharge Plan and Services In-house Referral: NA Discharge Planning Services: CM Consult Post Acute Care Choice: [place] arrangements for the past 2 months: Single Family Home                 DME Arranged: N/A         HH Arranged: PT, OT HH Agency: [place] [facility] Agency Contacted: 11/16/22 Time HH Agency Contacted: 36 Representative spoke with at [place]: Georgina Snell  Prior Living Arrangements/Services Living arrangements for the past 2 months: [place] Lives with:: Spouse Patient language and need for interpreter reviewed:: Yes Do you feel safe going back to the place where you live?: Yes      Need for Family Participation in Patient Care: No (Comment) Care giver  support system in place?: No (comment) Current home services: DME Criminal Activity/Legal Involvement Pertinent to Current Situation/Hospitalization: No - Comment as needed  Activities of Daily Living Home Assistive Devices/Equipment: None ADL Screening (condition at time of admission) Patient's cognitive ability adequate to safely complete daily activities?: Yes Is the patient deaf or have difficulty hearing?: No Does the patient have difficulty seeing, even when wearing glasses/contacts?: No Does the patient have difficulty concentrating, remembering, or making decisions?: No Patient able to express need for assistance with ADLs?: Yes Does the patient have difficulty dressing or bathing?: No Independently performs ADLs?: Yes (appropriate for developmental age) Does the patient have difficulty walking or climbing stairs?: Yes Weakness of Legs: Both Weakness of Arms/Hands: None  Permission Sought/Granted Permission sought to share information with : Case Manager Permission granted to share information with : Yes, Verbal Permission Granted     Permission granted to share info w AGENCY: HH        Emotional Assessment Appearance:: Other (Comment Required (working remotely ([place])) Attitude/Demeanor/Rapport: Gracious, Engaged Affect (typically observed): Unable to Assess Orientation: : Oriented to Self, Oriented to Place, Oriented to  Time, Oriented to Situation Alcohol / Substance Use: Not Applicable Psych Involvement: No (comment)  Admission diagnosis:  Weakness [R53.1] Hypoxia [R09.02] COVID-19 virus infection [U07.1] COVID-19 [U07.1] Patient Active Problem List   Diagnosis Date Noted   COVID-19 virus infection 11/15/2022   Sepsis ([employer]) 11/15/2022   Emesis 99991111   Metabolic acidosis 99991111  CVA (cerebral vascular accident) ([place]) 06/05/2022   Essential hypertension 06/04/2022   Osteoarthritis of right knee 01/01/2022   Bilateral hip pain 11/23/2021   Murmur  03/16/2020   History of stroke 02/02/2019   Mixed hyperlipidemia 02/02/2019   TIA (transient ischemic attack) 10/29/2018   Dyslipidemia 10/29/2018   Type 2 diabetes mellitus with hyperlipidemia ([place]) 10/29/2018   Tendinopathy of hip 07/12/2016   OA (osteoarthritis) of knee 05/29/2016   Sleep disturbance 05/29/2016   Bilateral acromioclavicular joint arthritis 11/08/2015   Dizzy 07/21/2015   Rotator cuff syndrome of both shoulders 06/21/2015   Pain in joint, ankle and foot 10/20/2012   Bilateral leg weakness 10/20/2012   PCP:  [name], MD Pharmacy:   [facility] [place], [place] [address] [place] [address] Phone: [phone] Fax: [phone]  [place], [place] [address] [place] [address] Phone: [phone] Fax: [phone]  [facility] [address] [place] [address] [place] [address] Phone: [phone] Fax: [phone]     Social Determinants of Health (SDOH) Social History: SDOH Screenings   Depression (PHQ2-9): Low Risk  (12/15/2018)  Tobacco Use: Medium Risk (11/14/2022)   SDOH Interventions:     Readmission Risk Interventions     No data to display

## 2022-11-20 DIAGNOSIS — G510 Bell's palsy: Secondary | ICD-10-CM | POA: Diagnosis not present

## 2022-11-20 DIAGNOSIS — E785 Hyperlipidemia, unspecified: Secondary | ICD-10-CM | POA: Diagnosis not present

## 2022-11-20 DIAGNOSIS — Z7984 Long term (current) use of oral hypoglycemic drugs: Secondary | ICD-10-CM | POA: Diagnosis not present

## 2022-11-20 DIAGNOSIS — I700 Atherosclerosis of aorta: Secondary | ICD-10-CM | POA: Diagnosis not present

## 2022-11-20 DIAGNOSIS — E1140 Type 2 diabetes mellitus with diabetic neuropathy, unspecified: Secondary | ICD-10-CM | POA: Diagnosis not present

## 2022-11-20 DIAGNOSIS — E872 Acidosis, unspecified: Secondary | ICD-10-CM | POA: Diagnosis not present

## 2022-11-20 DIAGNOSIS — Z8673 Personal history of transient ischemic attack (TIA), and cerebral infarction without residual deficits: Secondary | ICD-10-CM | POA: Diagnosis not present

## 2022-11-20 DIAGNOSIS — M1990 Unspecified osteoarthritis, unspecified site: Secondary | ICD-10-CM | POA: Diagnosis not present

## 2022-11-20 DIAGNOSIS — I10 Essential (primary) hypertension: Secondary | ICD-10-CM | POA: Diagnosis not present

## 2022-11-20 DIAGNOSIS — Z86718 Personal history of other venous thrombosis and embolism: Secondary | ICD-10-CM | POA: Diagnosis not present

## 2022-11-20 DIAGNOSIS — Z7985 Long-term (current) use of injectable non-insulin antidiabetic drugs: Secondary | ICD-10-CM | POA: Diagnosis not present

## 2022-11-20 DIAGNOSIS — Z9181 History of falling: Secondary | ICD-10-CM | POA: Diagnosis not present

## 2022-11-20 DIAGNOSIS — Z7982 Long term (current) use of aspirin: Secondary | ICD-10-CM | POA: Diagnosis not present

## 2022-11-20 DIAGNOSIS — U071 COVID-19: Secondary | ICD-10-CM | POA: Diagnosis not present

## 2022-11-20 LAB — CULTURE, BLOOD (ROUTINE X 2)
Culture: NO GROWTH
Culture: NO GROWTH
Special Requests: ADEQUATE

## 2022-11-21 DIAGNOSIS — I700 Atherosclerosis of aorta: Secondary | ICD-10-CM | POA: Diagnosis not present

## 2022-11-21 DIAGNOSIS — M1990 Unspecified osteoarthritis, unspecified site: Secondary | ICD-10-CM | POA: Diagnosis not present

## 2022-11-21 DIAGNOSIS — G510 Bell's palsy: Secondary | ICD-10-CM | POA: Diagnosis not present

## 2022-11-21 DIAGNOSIS — I10 Essential (primary) hypertension: Secondary | ICD-10-CM | POA: Diagnosis not present

## 2022-11-21 DIAGNOSIS — Z09 Encounter for follow-up examination after completed treatment for conditions other than malignant neoplasm: Secondary | ICD-10-CM | POA: Diagnosis not present

## 2022-11-21 DIAGNOSIS — U071 COVID-19: Secondary | ICD-10-CM | POA: Diagnosis not present

## 2022-11-21 DIAGNOSIS — E1140 Type 2 diabetes mellitus with diabetic neuropathy, unspecified: Secondary | ICD-10-CM | POA: Diagnosis not present

## 2022-11-27 ENCOUNTER — Telehealth: Payer: Self-pay | Admitting: Adult Health

## 2022-11-27 DIAGNOSIS — G510 Bell's palsy: Secondary | ICD-10-CM | POA: Diagnosis not present

## 2022-11-27 DIAGNOSIS — U071 COVID-19: Secondary | ICD-10-CM | POA: Diagnosis not present

## 2022-11-27 DIAGNOSIS — I700 Atherosclerosis of aorta: Secondary | ICD-10-CM | POA: Diagnosis not present

## 2022-11-27 DIAGNOSIS — I10 Essential (primary) hypertension: Secondary | ICD-10-CM | POA: Diagnosis not present

## 2022-11-27 DIAGNOSIS — E1140 Type 2 diabetes mellitus with diabetic neuropathy, unspecified: Secondary | ICD-10-CM | POA: Diagnosis not present

## 2022-11-27 DIAGNOSIS — M1990 Unspecified osteoarthritis, unspecified site: Secondary | ICD-10-CM | POA: Diagnosis not present

## 2022-11-28 ENCOUNTER — Ambulatory Visit (INDEPENDENT_AMBULATORY_CARE_PROVIDER_SITE_OTHER): Payer: MEDICARE | Admitting: Adult Health

## 2022-11-28 ENCOUNTER — Encounter: Payer: Self-pay | Admitting: Adult Health

## 2022-11-28 VITALS — HR 82 | Ht 68.0 in | Wt 175.2 lb

## 2022-11-28 DIAGNOSIS — I639 Cerebral infarction, unspecified: Secondary | ICD-10-CM

## 2022-11-28 NOTE — Progress Notes (Signed)
PATIENT: Beth Macdonald DOB: 1946/05/04  REASON FOR VISIT: follow up HISTORY FROM: patient PRIMARY NEUROLOGIST: Dr. Mickel Duhamel NP  Chief Complaint  Patient presents with   Follow-up    Pt in 19 with wife Pt here for stroke f/u  Pt states Dr. Leonie Man ordered a loop for heart two months ago to rule out AFIB Pt states wants the progress of the heart monitor.Pt states no one has called her to give her results   Pt was admitted to hospital  March 13th +covid  and d/c March 15th      HISTORY OF PRESENT ILLNESS: Today 11/28/22  Beth Macdonald is a 77 y.o. female who has been followed in this office for Stroke. Returns today for follow-up.  Overall she is doing well.  She did have a loop recorder implanted.  Reports that she has not been told she had any abnormal rhythms.  She remains on aspirin.  Primary care is managing her blood pressure cholesterol and diabetes.  Reports that she had COVID 2 weeks ago and since then she has been more fatigued.  Also when she had COVID she had muscle fatigue primarily in the lower extremities.  Since then this has improved.  She returns today for evaluation.  HISTORY Beth Macdonald is a pleasant 77 year old Caucasian lady seen today for office consultation visit for stroke.  She is accompanied by her wife.  History is obtained from them and review of electronic medical records and I personally reviewed pertinent available imaging: PACS.Beth Macdonald is a 77 y.o. female with a past medical history of TIA 10-2018, facial nerve palsy, vertigo, hypertension, and knee osteoarthritis who presents for slurred speech and dizziness.  She reports that she ate breakfast on 06/03/22 and then became dizzy after returning to her bedroom.  She described the dizziness as the room spinning with her staying still.  She believed that she was just having an episode of vertical and tried an Epley maneuver however the dizziness persisted which is abnormal for her.  She then  noticed slurred speech, however denies word finding difficulties, headache, weakness, visual changes, swallowing difficulty, or recent illness. Her dizziness had resolved by the time she reached the emergency room.  She does have a history of Bell's palsy and does have a facial droop at baseline.  Her spouse denies any changes to this facial droop, but does notice the slurred speech persisting.Of note, she did have a knee replacement in May 2023 and has used a walker to ambulate since then.  MRI scan of the brain on 06/04/2022 showed acute subcortical infarct in the right anterior cerebral artery distribution with chronic blood products in the medial left temporal lobe small vessel disease.  Echocardiogram showed ejection fraction of 60 to 65%.  Hemoglobin A1c was 6.0.  LDL cholesterol 68 mg percent.  CT angiogram showed occlusion of the left PCA P1 segment and mild cardiothoracic process but both anterior cerebral arteries normal.  Patient was started on aspirin and Plavix for 3 weeks followed by aspirin alone and advised to undergo a 30-day heart monitor for paroxysmal A-fib but she missed 2 appointments) has not been done.  She has finished physical occupational therapy doing well.  She is able to ambulate with prefers to use a cane while walking outdoors.  Did have several falls in the first week after her stroke which is now getting better.  She is tolerating aspirin well without bruising or bleeding.  She remains on Repatha  injections for her cholesterol which is well-controlled .she was recently started by primary care physician on Ozempic She does complain of feeling tired easily.  She plans to return back to work soon. Prior office visit 12/15/2018 : Beth Macdonald was initially scheduled today for in office hospital follow-up regarding TIA on 10/29/2018 but due to COVID-19 safety precautions, visit transition to telemedicine via WebEx. History obtained from patient and chart review. Reviewed all  radiology images and labs personally.  [name] is a 77 y.o. 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 CT head reviewed and negative for acute infarct or abnormality. MRI brain reviewed which was negative for acute infarct or abnormality but did show evidence of small vessel disease. CTA head/neck showed posterior circulation atherosclerosis L>R, L P2 occlusion, mod to severe R P2, RLL pulm nodule and borderline R hilar lymphadenopathy along with aortic atherosclerosis and beading L>R ICA. 2D echo showed EF of 60-65% without cardiac source of embolus identified. Symptoms likely related to left brian TIA. Recommended DAPT x3 weeks then aspirin alone along with continuation of atorvastatin 20mg . HTN and DM stable with A1c 6.6. Other stroke risk factors include advanced age but no prior hx of stroke/TIA. Recommended outpatient 30 day cardiac monitor to assess for AF as possible cause of TIA.  As all symptoms resolved, she was discharged home in stable condition without therapy needs.              She reports she has been doing well since hospital discharge without recurring or new stroke/TIA symptoms.  She has completed 3 weeks DAPT and continues on aspirin alone without side effects of bleeding or bruising.  Recent lipid panel by PCP showing LDL 102 glycerides 207 therefore increased atorvastatin from 20 mg to 40 mg daily along with initiating Zetia and plans on repeating levels in 02/2019.  Blood pressure has been on the lower side with SBP 100-120 but mainly low 100s.  Amlodipine dosage increase by PCP initially after hospital discharge due to continued elevated BP readings but due to side effect of BLE edema and improvement of BP, dosage decreased and has since been discontinued approximately 1 week ago.  Reports sleeping well at night, only occasional daytime drowsiness and denies snoring at night.  Currently wearing cardiac  monitor which apparently was ordered for 14 days and will be completed in 1 week's time to assess for potential atrial fibrillation.  History of concussions in the past but denies stroke.  She does endorse occasional dizzy spells which has been ongoing for numerous years and endorses improvement as of recent with less frequent spells.  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  No further concerns at this time.    REVIEW OF SYSTEMS: Out of a complete 14 system review of symptoms, the patient complains only of the following symptoms, and all other reviewed systems are negative.  ALLERGIES: Allergies  Allergen Reactions   Clindamycin/Lincomycin Other (See Comments)    Cannot tolerate mycins causes C-Diff   Milk-Related Compounds Diarrhea   Tetracyclines & Related Other (See Comments)    "Feel really nervous"    HOME MEDICATIONS: Outpatient Medications Prior to Visit  Medication Sig Dispense Refill   aspirin EC 81 MG tablet Take 1 tablet (81 mg total) by mouth daily. Swallow whole. 30 tablet 12   atorvastatin (LIPITOR) 20 MG tablet Take 1 tablet (20 mg total) by mouth daily.  Hold until you are taking Paxlovid     benzonatate (TESSALON) 100 MG capsule Take 1 capsule (100 mg total) by mouth 3 (three) times daily. 20 capsule 0   Calcium Carbonate Antacid (TUMS PO) Take 1 tablet by mouth daily as needed (acid control).     Clobetasol Prop Emollient Base (CLOBETASOL PROPIONATE E) 0.05 % emollient cream Apply 1 Application topically 2 (two) times daily. (Patient taking differently: Apply 1 Application topically 2 (two) times daily as needed (for rash).) 60 g 10   cycloSPORINE (RESTASIS) 0.05 % ophthalmic emulsion Place 1 drop into both eyes 2 (two) times daily.     desonide (DESOWEN) 0.05 % cream Apply topically 2 (two) times daily as needed (Rash). 60 g 11   ibuprofen (ADVIL) 800 MG tablet Take 1 tablet (800 mg total) by mouth every 6 (six) hours as needed for mild pain or  moderate pain. 30 tablet 0   lisinopril (ZESTRIL) 10 MG tablet Take 10 mg by mouth daily.     metFORMIN (GLUCOPHAGE) 500 MG tablet Take 1,000 mg by mouth 2 (two) times daily with a meal.     ondansetron (ZOFRAN) 4 MG tablet Take 1 tablet (4 mg total) by mouth every 6 (six) hours as needed for nausea. 20 tablet 0   Polyethyl Glycol-Propyl Glycol (SYSTANE FREE OP) Apply 1 drop to eye daily as needed (dryness/irritation).     REPATHA SURECLICK [id number] MG/ML SOAJ Inject 140 mg into the skin every 14 (fourteen) days. Sunday     Semaglutide,0.25 or 0.5MG /DOS, 2 MG/3ML SOPN Inject 0.5 mg into the skin once a week. Tuesday     No facility-administered medications prior to visit.    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[facility])    Hyperlipidemia    Mitral valve insufficiency    Osteoarthritis    SCCA (squamous cell carcinoma) of skin 03/22/2021   Bridge of nose (in situ)   Skull fracture ([place])    age 29   Squamous cell carcinoma of skin 03/22/2021   Right forehead (in situ)   TIA (transient ischemic attack) 2020   Varicose vein of leg     PAST SURGICAL HISTORY: Past Surgical History:  Procedure Laterality Date   ELBOW SURGERY Bilateral    20 yrs ago   ENDOVENOUS ABLATION SAPHENOUS VEIN W/ LASER Bilateral    ENDOVENOUS ABLATION SAPHENOUS VEIN W/ LASER Left 11/19/2019   endovenous laser ablation anterior accessory branch of left GSV and stab phlebectomy> 20 incisions left leg by Gae Gallop MD    shoulder Bilateral    [phone]   TOENAIL EXCISION Right 2019   Big toe   TONSILLECTOMY Bilateral    TOTAL KNEE ARTHROPLASTY Right 01/01/2022   Procedure: TOTAL KNEE ARTHROPLASTY;  Surgeon: Gaynelle Arabian, MD;  Location: [facility];  Service: Orthopedics;   Laterality: Right;    FAMILY HISTORY: Family History  Problem Relation Age of Onset   Stroke Mother    Hypertension Father    Dementia Father    Leukemia Brother    Stroke Brother    Colon cancer Neg Hx    Esophageal cancer Neg Hx    Pancreatic cancer Neg Hx    Stomach cancer Neg Hx  SOCIAL HISTORY: Social History   Socioeconomic History   Marital status: Married    Spouse name: Not on file   Number of children: 0   Years of education: Not on file   Highest education level: Not on file  Occupational History   Not on file  Tobacco Use   Smoking status: Former    Packs/day: 1.00    Years: 20.00    Additional pack years: 0.00    Total pack years: 20.00    Types: Cigarettes    Quit date: 45    Years since quitting: 44.2   Smokeless tobacco: Never  Vaping Use   Vaping Use: Never used  Substance and Sexual Activity   Alcohol use: Yes    Comment: social/rarely   Drug use: Never   Sexual activity: Yes  Other Topics Concern   Not on file  Social History Narrative   Not on file   Social Determinants of Health   Financial Resource Strain: Not on file  Food Insecurity: Not on file  Transportation Needs: Not on file  Physical Activity: Not on file  Stress: Not on file  Social Connections: Not on file  Intimate Partner Violence: Not on file      PHYSICAL EXAM  Vitals:   11/28/22 1002  Pulse: 82  Weight: 175 lb 3.2 oz (79.5 kg)  Height: 5\' 8"  (1.727 m)   Body mass index is 26.64 kg/m.  Generalized: Well developed, in no acute distress   Neurological examination  Mentation: Alert oriented to time, place, history taking. Follows all commands speech and language fluent Cranial nerve II-XII: Pupils were equal round reactive to light. Extraocular movements were full, visual field were full on confrontational test. Facial sensation and strength were normal. Uvula tongue midline. Head turning and shoulder shrug  were normal and symmetric. Motor: The motor  testing reveals 5 over 5 strength of all 4 extremities. Good symmetric motor tone is noted throughout.  Sensory: Sensory testing is intact to soft touch on all 4 extremities. No evidence of extinction is noted.  Coordination: Cerebellar testing reveals good finger-nose-finger and heel-to-shin bilaterally.  Gait and station: Uses a cane when ambulating.  Gait is slightly wide-based.   DIAGNOSTIC DATA (LABS, IMAGING, TESTING) - I reviewed patient records, labs, notes, testing and imaging myself where available.  Lab Results  Component Value Date   WBC 9.5 11/14/2022   HGB 13.1 11/14/2022   HCT 40.2 11/14/2022   MCV 95.3 11/14/2022   PLT 176 11/14/2022      Component Value Date/Time   NA 138 11/15/2022 0436   K 3.4 (L) 11/15/2022 0436   CL 102 11/15/2022 0436   CO2 25 11/15/2022 0436   GLUCOSE 135 (H) 11/15/2022 0436   BUN 14 11/15/2022 0436   CREATININE 1.02 (H) 11/15/2022 0436   CREATININE 1.04 12/25/2012 1609   CALCIUM 8.5 (L) 11/15/2022 0436   PROT 6.4 (L) 11/15/2022 0436   ALBUMIN 3.2 (L) 11/15/2022 0436   AST 18 11/15/2022 0436   ALT 14 11/15/2022 0436   ALKPHOS 33 (L) 11/15/2022 0436   BILITOT 0.8 11/15/2022 0436   GFRNONAA 56 (L) 11/15/2022 0436   GFRAA >60 10/30/2018 0743   Lab Results  Component Value Date   CHOL 172 06/05/2022   HDL 71 06/05/2022   LDLCALC 68 06/05/2022   TRIG 165 (H) 06/05/2022   CHOLHDL 2.4 06/05/2022   Lab Results  Component Value Date   HGBA1C 6.7 (H) 11/15/2022   No results  found for: "VITAMINB12" Lab Results  Component Value Date   TSH 2.406 06/04/2022      ASSESSMENT AND PLAN 77 y.o. 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 here with:  H/o of stroke    Continue aspirin 81 mg daily   for secondary stroke prevention.  Discussed secondary stroke prevention measures and importance of close PCP follow up for aggressive stroke risk factor management. I have gone over the pathophysiology of stroke, warning signs and symptoms, risk factors and their management in some detail with instructions to go to the closest emergency room for symptoms of concern. HTN: BP goal <130/90.   HLD: LDL goal <70.  DMII: A1c goal<7.0. Recent A1c 6.7.  Encouraged patient to monitor diet and encouraged exercise Patient was concerned that her loop recorder may not be transmitting as she has not gotten any feedback since it was placed.  Advised that I will send a message to her cardiologist to ensure that it is working properly FU with our office PRN      Ward Givens, MSN, NP-C 11/28/2022, 9:53 AM [facility] [address] [place], [place] [address] [phone]

## 2022-11-28 NOTE — Patient Instructions (Signed)
Your Plan:  Continue Aspirin  Blood pressure goal <130/90 Cholesterol LDL goal <70 Diabetes goal A1c <7 Monitor diet and try to exercise   Thank you for coming to see us at [facility]. I hope we have been able to provide you high quality care today.  You may receive a patient satisfaction survey over the next few weeks. We would appreciate your feedback and comments so that we may continue to improve ourselves and the health of our patients.  

## 2022-12-05 DIAGNOSIS — G510 Bell's palsy: Secondary | ICD-10-CM | POA: Diagnosis not present

## 2022-12-05 DIAGNOSIS — I10 Essential (primary) hypertension: Secondary | ICD-10-CM | POA: Diagnosis not present

## 2022-12-05 DIAGNOSIS — M1990 Unspecified osteoarthritis, unspecified site: Secondary | ICD-10-CM | POA: Diagnosis not present

## 2022-12-05 DIAGNOSIS — U071 COVID-19: Secondary | ICD-10-CM | POA: Diagnosis not present

## 2022-12-05 DIAGNOSIS — I700 Atherosclerosis of aorta: Secondary | ICD-10-CM | POA: Diagnosis not present

## 2022-12-05 DIAGNOSIS — E1140 Type 2 diabetes mellitus with diabetic neuropathy, unspecified: Secondary | ICD-10-CM | POA: Diagnosis not present

## 2022-12-07 DIAGNOSIS — M1990 Unspecified osteoarthritis, unspecified site: Secondary | ICD-10-CM | POA: Diagnosis not present

## 2022-12-07 DIAGNOSIS — G510 Bell's palsy: Secondary | ICD-10-CM | POA: Diagnosis not present

## 2022-12-07 DIAGNOSIS — I700 Atherosclerosis of aorta: Secondary | ICD-10-CM | POA: Diagnosis not present

## 2022-12-07 DIAGNOSIS — E1140 Type 2 diabetes mellitus with diabetic neuropathy, unspecified: Secondary | ICD-10-CM | POA: Diagnosis not present

## 2022-12-07 DIAGNOSIS — U071 COVID-19: Secondary | ICD-10-CM | POA: Diagnosis not present

## 2022-12-07 DIAGNOSIS — I10 Essential (primary) hypertension: Secondary | ICD-10-CM | POA: Diagnosis not present

## 2022-12-11 DIAGNOSIS — M1990 Unspecified osteoarthritis, unspecified site: Secondary | ICD-10-CM | POA: Diagnosis not present

## 2022-12-11 DIAGNOSIS — I10 Essential (primary) hypertension: Secondary | ICD-10-CM | POA: Diagnosis not present

## 2022-12-11 DIAGNOSIS — E1140 Type 2 diabetes mellitus with diabetic neuropathy, unspecified: Secondary | ICD-10-CM | POA: Diagnosis not present

## 2022-12-11 DIAGNOSIS — G510 Bell's palsy: Secondary | ICD-10-CM | POA: Diagnosis not present

## 2022-12-11 DIAGNOSIS — I700 Atherosclerosis of aorta: Secondary | ICD-10-CM | POA: Diagnosis not present

## 2022-12-11 DIAGNOSIS — U071 COVID-19: Secondary | ICD-10-CM | POA: Diagnosis not present

## 2022-12-14 DIAGNOSIS — I700 Atherosclerosis of aorta: Secondary | ICD-10-CM | POA: Diagnosis not present

## 2022-12-14 DIAGNOSIS — E1140 Type 2 diabetes mellitus with diabetic neuropathy, unspecified: Secondary | ICD-10-CM | POA: Diagnosis not present

## 2022-12-14 DIAGNOSIS — G510 Bell's palsy: Secondary | ICD-10-CM | POA: Diagnosis not present

## 2022-12-14 DIAGNOSIS — U071 COVID-19: Secondary | ICD-10-CM | POA: Diagnosis not present

## 2022-12-14 DIAGNOSIS — M1990 Unspecified osteoarthritis, unspecified site: Secondary | ICD-10-CM | POA: Diagnosis not present

## 2022-12-14 DIAGNOSIS — I10 Essential (primary) hypertension: Secondary | ICD-10-CM | POA: Diagnosis not present

## 2022-12-17 ENCOUNTER — Ambulatory Visit: Payer: MEDICARE | Admitting: Adult Health

## 2022-12-18 DIAGNOSIS — M1990 Unspecified osteoarthritis, unspecified site: Secondary | ICD-10-CM | POA: Diagnosis not present

## 2022-12-18 DIAGNOSIS — G510 Bell's palsy: Secondary | ICD-10-CM | POA: Diagnosis not present

## 2022-12-18 DIAGNOSIS — E1140 Type 2 diabetes mellitus with diabetic neuropathy, unspecified: Secondary | ICD-10-CM | POA: Diagnosis not present

## 2022-12-18 DIAGNOSIS — I10 Essential (primary) hypertension: Secondary | ICD-10-CM | POA: Diagnosis not present

## 2022-12-18 DIAGNOSIS — U071 COVID-19: Secondary | ICD-10-CM | POA: Diagnosis not present

## 2022-12-18 DIAGNOSIS — I700 Atherosclerosis of aorta: Secondary | ICD-10-CM | POA: Diagnosis not present

## 2022-12-20 ENCOUNTER — Ambulatory Visit (INDEPENDENT_AMBULATORY_CARE_PROVIDER_SITE_OTHER): Payer: MEDICARE

## 2022-12-20 DIAGNOSIS — I639 Cerebral infarction, unspecified: Secondary | ICD-10-CM | POA: Diagnosis not present

## 2022-12-20 DIAGNOSIS — E872 Acidosis, unspecified: Secondary | ICD-10-CM | POA: Diagnosis not present

## 2022-12-20 DIAGNOSIS — Z7985 Long-term (current) use of injectable non-insulin antidiabetic drugs: Secondary | ICD-10-CM | POA: Diagnosis not present

## 2022-12-20 DIAGNOSIS — M1990 Unspecified osteoarthritis, unspecified site: Secondary | ICD-10-CM | POA: Diagnosis not present

## 2022-12-20 DIAGNOSIS — G510 Bell's palsy: Secondary | ICD-10-CM | POA: Diagnosis not present

## 2022-12-20 DIAGNOSIS — Z8673 Personal history of transient ischemic attack (TIA), and cerebral infarction without residual deficits: Secondary | ICD-10-CM | POA: Diagnosis not present

## 2022-12-20 DIAGNOSIS — I10 Essential (primary) hypertension: Secondary | ICD-10-CM | POA: Diagnosis not present

## 2022-12-20 DIAGNOSIS — E1140 Type 2 diabetes mellitus with diabetic neuropathy, unspecified: Secondary | ICD-10-CM | POA: Diagnosis not present

## 2022-12-20 DIAGNOSIS — Z86718 Personal history of other venous thrombosis and embolism: Secondary | ICD-10-CM | POA: Diagnosis not present

## 2022-12-20 DIAGNOSIS — E785 Hyperlipidemia, unspecified: Secondary | ICD-10-CM | POA: Diagnosis not present

## 2022-12-20 DIAGNOSIS — Z7982 Long term (current) use of aspirin: Secondary | ICD-10-CM | POA: Diagnosis not present

## 2022-12-20 DIAGNOSIS — I700 Atherosclerosis of aorta: Secondary | ICD-10-CM | POA: Diagnosis not present

## 2022-12-20 DIAGNOSIS — Z7984 Long term (current) use of oral hypoglycemic drugs: Secondary | ICD-10-CM | POA: Diagnosis not present

## 2022-12-20 DIAGNOSIS — Z9181 History of falling: Secondary | ICD-10-CM | POA: Diagnosis not present

## 2022-12-20 DIAGNOSIS — U071 COVID-19: Secondary | ICD-10-CM | POA: Diagnosis not present

## 2022-12-21 DIAGNOSIS — M810 Age-related osteoporosis without current pathological fracture: Secondary | ICD-10-CM | POA: Diagnosis not present

## 2022-12-21 DIAGNOSIS — I10 Essential (primary) hypertension: Secondary | ICD-10-CM | POA: Diagnosis not present

## 2022-12-21 LAB — CUP PACEART REMOTE DEVICE CHECK: Date Time Interrogation Session: 20240418162414

## 2022-12-24 DIAGNOSIS — I700 Atherosclerosis of aorta: Secondary | ICD-10-CM | POA: Diagnosis not present

## 2022-12-24 DIAGNOSIS — I10 Essential (primary) hypertension: Secondary | ICD-10-CM | POA: Diagnosis not present

## 2022-12-24 DIAGNOSIS — R2689 Other abnormalities of gait and mobility: Secondary | ICD-10-CM | POA: Diagnosis not present

## 2022-12-24 DIAGNOSIS — G629 Polyneuropathy, unspecified: Secondary | ICD-10-CM | POA: Diagnosis not present

## 2022-12-24 DIAGNOSIS — Z0000 Encounter for general adult medical examination without abnormal findings: Secondary | ICD-10-CM | POA: Diagnosis not present

## 2022-12-24 DIAGNOSIS — N1831 Chronic kidney disease, stage 3a: Secondary | ICD-10-CM | POA: Diagnosis not present

## 2022-12-24 DIAGNOSIS — M810 Age-related osteoporosis without current pathological fracture: Secondary | ICD-10-CM | POA: Diagnosis not present

## 2022-12-24 DIAGNOSIS — E1121 Type 2 diabetes mellitus with diabetic nephropathy: Secondary | ICD-10-CM | POA: Diagnosis not present

## 2022-12-24 DIAGNOSIS — N3281 Overactive bladder: Secondary | ICD-10-CM | POA: Diagnosis not present

## 2022-12-24 DIAGNOSIS — Z8673 Personal history of transient ischemic attack (TIA), and cerebral infarction without residual deficits: Secondary | ICD-10-CM | POA: Diagnosis not present

## 2022-12-25 DIAGNOSIS — M6281 Muscle weakness (generalized): Secondary | ICD-10-CM | POA: Diagnosis not present

## 2022-12-26 DIAGNOSIS — M1990 Unspecified osteoarthritis, unspecified site: Secondary | ICD-10-CM | POA: Diagnosis not present

## 2022-12-26 DIAGNOSIS — G510 Bell's palsy: Secondary | ICD-10-CM | POA: Diagnosis not present

## 2022-12-26 DIAGNOSIS — I700 Atherosclerosis of aorta: Secondary | ICD-10-CM | POA: Diagnosis not present

## 2022-12-26 DIAGNOSIS — I10 Essential (primary) hypertension: Secondary | ICD-10-CM | POA: Diagnosis not present

## 2022-12-26 DIAGNOSIS — E1140 Type 2 diabetes mellitus with diabetic neuropathy, unspecified: Secondary | ICD-10-CM | POA: Diagnosis not present

## 2022-12-26 DIAGNOSIS — U071 COVID-19: Secondary | ICD-10-CM | POA: Diagnosis not present

## 2022-12-31 DIAGNOSIS — L304 Erythema intertrigo: Secondary | ICD-10-CM | POA: Diagnosis not present

## 2022-12-31 DIAGNOSIS — Z08 Encounter for follow-up examination after completed treatment for malignant neoplasm: Secondary | ICD-10-CM | POA: Diagnosis not present

## 2022-12-31 DIAGNOSIS — L218 Other seborrheic dermatitis: Secondary | ICD-10-CM | POA: Diagnosis not present

## 2022-12-31 DIAGNOSIS — L821 Other seborrheic keratosis: Secondary | ICD-10-CM | POA: Diagnosis not present

## 2022-12-31 DIAGNOSIS — L568 Other specified acute skin changes due to ultraviolet radiation: Secondary | ICD-10-CM | POA: Diagnosis not present

## 2022-12-31 DIAGNOSIS — Z85828 Personal history of other malignant neoplasm of skin: Secondary | ICD-10-CM | POA: Diagnosis not present

## 2022-12-31 DIAGNOSIS — Z1283 Encounter for screening for malignant neoplasm of skin: Secondary | ICD-10-CM | POA: Diagnosis not present

## 2023-01-01 DIAGNOSIS — U071 COVID-19: Secondary | ICD-10-CM | POA: Diagnosis not present

## 2023-01-01 DIAGNOSIS — I10 Essential (primary) hypertension: Secondary | ICD-10-CM | POA: Diagnosis not present

## 2023-01-01 DIAGNOSIS — E782 Mixed hyperlipidemia: Secondary | ICD-10-CM | POA: Diagnosis not present

## 2023-01-01 DIAGNOSIS — G510 Bell's palsy: Secondary | ICD-10-CM | POA: Diagnosis not present

## 2023-01-01 DIAGNOSIS — E1140 Type 2 diabetes mellitus with diabetic neuropathy, unspecified: Secondary | ICD-10-CM | POA: Diagnosis not present

## 2023-01-01 DIAGNOSIS — I700 Atherosclerosis of aorta: Secondary | ICD-10-CM | POA: Diagnosis not present

## 2023-01-01 DIAGNOSIS — M810 Age-related osteoporosis without current pathological fracture: Secondary | ICD-10-CM | POA: Diagnosis not present

## 2023-01-01 DIAGNOSIS — M159 Polyosteoarthritis, unspecified: Secondary | ICD-10-CM | POA: Diagnosis not present

## 2023-01-01 DIAGNOSIS — M1990 Unspecified osteoarthritis, unspecified site: Secondary | ICD-10-CM | POA: Diagnosis not present

## 2023-01-03 DIAGNOSIS — E1140 Type 2 diabetes mellitus with diabetic neuropathy, unspecified: Secondary | ICD-10-CM | POA: Diagnosis not present

## 2023-01-03 DIAGNOSIS — I700 Atherosclerosis of aorta: Secondary | ICD-10-CM | POA: Diagnosis not present

## 2023-01-03 DIAGNOSIS — M1990 Unspecified osteoarthritis, unspecified site: Secondary | ICD-10-CM | POA: Diagnosis not present

## 2023-01-03 DIAGNOSIS — I10 Essential (primary) hypertension: Secondary | ICD-10-CM | POA: Diagnosis not present

## 2023-01-03 DIAGNOSIS — U071 COVID-19: Secondary | ICD-10-CM | POA: Diagnosis not present

## 2023-01-03 DIAGNOSIS — G510 Bell's palsy: Secondary | ICD-10-CM | POA: Diagnosis not present

## 2023-01-10 DIAGNOSIS — U071 COVID-19: Secondary | ICD-10-CM | POA: Diagnosis not present

## 2023-01-10 DIAGNOSIS — I700 Atherosclerosis of aorta: Secondary | ICD-10-CM | POA: Diagnosis not present

## 2023-01-10 DIAGNOSIS — G510 Bell's palsy: Secondary | ICD-10-CM | POA: Diagnosis not present

## 2023-01-10 DIAGNOSIS — M1990 Unspecified osteoarthritis, unspecified site: Secondary | ICD-10-CM | POA: Diagnosis not present

## 2023-01-10 DIAGNOSIS — E1140 Type 2 diabetes mellitus with diabetic neuropathy, unspecified: Secondary | ICD-10-CM | POA: Diagnosis not present

## 2023-01-10 DIAGNOSIS — I10 Essential (primary) hypertension: Secondary | ICD-10-CM | POA: Diagnosis not present

## 2023-01-14 DIAGNOSIS — U071 COVID-19: Secondary | ICD-10-CM | POA: Diagnosis not present

## 2023-01-14 DIAGNOSIS — G510 Bell's palsy: Secondary | ICD-10-CM | POA: Diagnosis not present

## 2023-01-14 DIAGNOSIS — E1140 Type 2 diabetes mellitus with diabetic neuropathy, unspecified: Secondary | ICD-10-CM | POA: Diagnosis not present

## 2023-01-14 DIAGNOSIS — I10 Essential (primary) hypertension: Secondary | ICD-10-CM | POA: Diagnosis not present

## 2023-01-16 DIAGNOSIS — M1990 Unspecified osteoarthritis, unspecified site: Secondary | ICD-10-CM | POA: Diagnosis not present

## 2023-01-16 DIAGNOSIS — E1140 Type 2 diabetes mellitus with diabetic neuropathy, unspecified: Secondary | ICD-10-CM | POA: Diagnosis not present

## 2023-01-16 DIAGNOSIS — G510 Bell's palsy: Secondary | ICD-10-CM | POA: Diagnosis not present

## 2023-01-16 DIAGNOSIS — U071 COVID-19: Secondary | ICD-10-CM | POA: Diagnosis not present

## 2023-01-16 DIAGNOSIS — I700 Atherosclerosis of aorta: Secondary | ICD-10-CM | POA: Diagnosis not present

## 2023-01-16 DIAGNOSIS — I10 Essential (primary) hypertension: Secondary | ICD-10-CM | POA: Diagnosis not present

## 2023-01-21 NOTE — Progress Notes (Signed)
Carelink Summary Report / Loop Recorder 

## 2023-01-22 ENCOUNTER — Ambulatory Visit (INDEPENDENT_AMBULATORY_CARE_PROVIDER_SITE_OTHER): Payer: MEDICARE

## 2023-01-22 DIAGNOSIS — I639 Cerebral infarction, unspecified: Secondary | ICD-10-CM | POA: Diagnosis not present

## 2023-01-23 DIAGNOSIS — M6281 Muscle weakness (generalized): Secondary | ICD-10-CM | POA: Diagnosis not present

## 2023-01-23 LAB — CUP PACEART REMOTE DEVICE CHECK: Date Time Interrogation Session: 20240521162412

## 2023-02-04 DIAGNOSIS — M6281 Muscle weakness (generalized): Secondary | ICD-10-CM | POA: Diagnosis not present

## 2023-02-06 DIAGNOSIS — M6281 Muscle weakness (generalized): Secondary | ICD-10-CM | POA: Diagnosis not present

## 2023-02-11 DIAGNOSIS — M6281 Muscle weakness (generalized): Secondary | ICD-10-CM | POA: Diagnosis not present

## 2023-02-14 NOTE — Progress Notes (Signed)
Carelink Summary Report / Loop Recorder 

## 2023-02-20 DIAGNOSIS — M6281 Muscle weakness (generalized): Secondary | ICD-10-CM | POA: Diagnosis not present

## 2023-02-25 ENCOUNTER — Ambulatory Visit: Payer: MEDICARE

## 2023-02-25 DIAGNOSIS — I639 Cerebral infarction, unspecified: Secondary | ICD-10-CM | POA: Diagnosis not present

## 2023-02-25 DIAGNOSIS — M6281 Muscle weakness (generalized): Secondary | ICD-10-CM | POA: Diagnosis not present

## 2023-02-25 LAB — CUP PACEART REMOTE DEVICE CHECK: Date Time Interrogation Session: 20240623230905

## 2023-02-27 DIAGNOSIS — M6281 Muscle weakness (generalized): Secondary | ICD-10-CM | POA: Diagnosis not present

## 2023-03-04 DIAGNOSIS — M6281 Muscle weakness (generalized): Secondary | ICD-10-CM | POA: Diagnosis not present

## 2023-03-06 DIAGNOSIS — M6281 Muscle weakness (generalized): Secondary | ICD-10-CM | POA: Diagnosis not present

## 2023-03-11 DIAGNOSIS — M6281 Muscle weakness (generalized): Secondary | ICD-10-CM | POA: Diagnosis not present

## 2023-03-13 DIAGNOSIS — R351 Nocturia: Secondary | ICD-10-CM | POA: Diagnosis not present

## 2023-03-13 DIAGNOSIS — R3912 Poor urinary stream: Secondary | ICD-10-CM | POA: Diagnosis not present

## 2023-03-13 DIAGNOSIS — R350 Frequency of micturition: Secondary | ICD-10-CM | POA: Diagnosis not present

## 2023-03-13 DIAGNOSIS — M6281 Muscle weakness (generalized): Secondary | ICD-10-CM | POA: Diagnosis not present

## 2023-03-18 DIAGNOSIS — M6281 Muscle weakness (generalized): Secondary | ICD-10-CM | POA: Diagnosis not present

## 2023-03-18 NOTE — Progress Notes (Signed)
 Carelink Summary Report / Loop Recorder 

## 2023-03-20 DIAGNOSIS — M6281 Muscle weakness (generalized): Secondary | ICD-10-CM | POA: Diagnosis not present

## 2023-03-25 DIAGNOSIS — M6281 Muscle weakness (generalized): Secondary | ICD-10-CM | POA: Diagnosis not present

## 2023-03-26 DIAGNOSIS — E1169 Type 2 diabetes mellitus with other specified complication: Secondary | ICD-10-CM | POA: Diagnosis not present

## 2023-03-26 DIAGNOSIS — N3281 Overactive bladder: Secondary | ICD-10-CM | POA: Diagnosis not present

## 2023-03-26 DIAGNOSIS — E782 Mixed hyperlipidemia: Secondary | ICD-10-CM | POA: Diagnosis not present

## 2023-03-26 DIAGNOSIS — M791 Myalgia, unspecified site: Secondary | ICD-10-CM | POA: Diagnosis not present

## 2023-03-26 DIAGNOSIS — Z8673 Personal history of transient ischemic attack (TIA), and cerebral infarction without residual deficits: Secondary | ICD-10-CM | POA: Diagnosis not present

## 2023-03-26 DIAGNOSIS — E1129 Type 2 diabetes mellitus with other diabetic kidney complication: Secondary | ICD-10-CM | POA: Diagnosis not present

## 2023-03-26 DIAGNOSIS — I10 Essential (primary) hypertension: Secondary | ICD-10-CM | POA: Diagnosis not present

## 2023-03-26 DIAGNOSIS — T466X5A Adverse effect of antihyperlipidemic and antiarteriosclerotic drugs, initial encounter: Secondary | ICD-10-CM | POA: Diagnosis not present

## 2023-03-27 DIAGNOSIS — M6281 Muscle weakness (generalized): Secondary | ICD-10-CM | POA: Diagnosis not present

## 2023-04-01 ENCOUNTER — Ambulatory Visit (INDEPENDENT_AMBULATORY_CARE_PROVIDER_SITE_OTHER): Payer: MEDICARE

## 2023-04-01 DIAGNOSIS — I639 Cerebral infarction, unspecified: Secondary | ICD-10-CM

## 2023-04-03 DIAGNOSIS — M6281 Muscle weakness (generalized): Secondary | ICD-10-CM | POA: Diagnosis not present

## 2023-04-05 DIAGNOSIS — M6281 Muscle weakness (generalized): Secondary | ICD-10-CM | POA: Diagnosis not present

## 2023-04-10 DIAGNOSIS — M6281 Muscle weakness (generalized): Secondary | ICD-10-CM | POA: Diagnosis not present

## 2023-04-12 DIAGNOSIS — M6281 Muscle weakness (generalized): Secondary | ICD-10-CM | POA: Diagnosis not present

## 2023-04-16 DIAGNOSIS — M6281 Muscle weakness (generalized): Secondary | ICD-10-CM | POA: Diagnosis not present

## 2023-04-18 NOTE — Progress Notes (Signed)
Carelink Summary Report / Loop Recorder 

## 2023-04-19 DIAGNOSIS — M6281 Muscle weakness (generalized): Secondary | ICD-10-CM | POA: Diagnosis not present

## 2023-04-22 DIAGNOSIS — M6281 Muscle weakness (generalized): Secondary | ICD-10-CM | POA: Diagnosis not present

## 2023-04-24 DIAGNOSIS — M6281 Muscle weakness (generalized): Secondary | ICD-10-CM | POA: Diagnosis not present

## 2023-04-29 DIAGNOSIS — M6281 Muscle weakness (generalized): Secondary | ICD-10-CM | POA: Diagnosis not present

## 2023-05-01 DIAGNOSIS — M6281 Muscle weakness (generalized): Secondary | ICD-10-CM | POA: Diagnosis not present

## 2023-05-01 DIAGNOSIS — R350 Frequency of micturition: Secondary | ICD-10-CM | POA: Diagnosis not present

## 2023-05-01 DIAGNOSIS — R351 Nocturia: Secondary | ICD-10-CM | POA: Diagnosis not present

## 2023-05-02 LAB — CUP PACEART REMOTE DEVICE CHECK: Date Time Interrogation Session: 20240828230059

## 2023-05-07 ENCOUNTER — Ambulatory Visit (INDEPENDENT_AMBULATORY_CARE_PROVIDER_SITE_OTHER): Payer: MEDICARE

## 2023-05-07 DIAGNOSIS — I639 Cerebral infarction, unspecified: Secondary | ICD-10-CM | POA: Diagnosis not present

## 2023-05-08 DIAGNOSIS — Z01419 Encounter for gynecological examination (general) (routine) without abnormal findings: Secondary | ICD-10-CM | POA: Diagnosis not present

## 2023-05-08 DIAGNOSIS — M6281 Muscle weakness (generalized): Secondary | ICD-10-CM | POA: Diagnosis not present

## 2023-05-08 DIAGNOSIS — Z23 Encounter for immunization: Secondary | ICD-10-CM | POA: Diagnosis not present

## 2023-05-08 DIAGNOSIS — Z1212 Encounter for screening for malignant neoplasm of rectum: Secondary | ICD-10-CM | POA: Diagnosis not present

## 2023-05-08 DIAGNOSIS — M8580 Other specified disorders of bone density and structure, unspecified site: Secondary | ICD-10-CM | POA: Diagnosis not present

## 2023-05-08 DIAGNOSIS — Z780 Asymptomatic menopausal state: Secondary | ICD-10-CM | POA: Diagnosis not present

## 2023-05-10 DIAGNOSIS — M6281 Muscle weakness (generalized): Secondary | ICD-10-CM | POA: Diagnosis not present

## 2023-05-16 DIAGNOSIS — M6281 Muscle weakness (generalized): Secondary | ICD-10-CM | POA: Diagnosis not present

## 2023-05-17 DIAGNOSIS — M6281 Muscle weakness (generalized): Secondary | ICD-10-CM | POA: Diagnosis not present

## 2023-05-17 NOTE — Progress Notes (Signed)
Carelink Summary Report / Loop Recorder 

## 2023-05-20 DIAGNOSIS — M6281 Muscle weakness (generalized): Secondary | ICD-10-CM | POA: Diagnosis not present

## 2023-05-22 DIAGNOSIS — M6281 Muscle weakness (generalized): Secondary | ICD-10-CM | POA: Diagnosis not present

## 2023-05-27 DIAGNOSIS — M6281 Muscle weakness (generalized): Secondary | ICD-10-CM | POA: Diagnosis not present

## 2023-05-29 DIAGNOSIS — M6281 Muscle weakness (generalized): Secondary | ICD-10-CM | POA: Diagnosis not present

## 2023-06-03 ENCOUNTER — Telehealth: Payer: Self-pay

## 2023-06-03 DIAGNOSIS — M6281 Muscle weakness (generalized): Secondary | ICD-10-CM | POA: Diagnosis not present

## 2023-06-03 NOTE — Telephone Encounter (Signed)
Following alert received from [employer] received for a 5 second episode of VT detected that was 176 bpm.  Attempted to call patient. No answer, LMTCB.

## 2023-06-04 NOTE — Telephone Encounter (Signed)
Pt would like a call back

## 2023-06-04 NOTE — Telephone Encounter (Signed)
Spoke to patients family who states patient is not available at this time but will return shortly and call back.

## 2023-06-04 NOTE — Telephone Encounter (Signed)
Patient denies any symptoms and aware to call  in the future if she has any symptoms.

## 2023-06-05 DIAGNOSIS — M6281 Muscle weakness (generalized): Secondary | ICD-10-CM | POA: Diagnosis not present

## 2023-06-05 LAB — CUP PACEART REMOTE DEVICE CHECK
Date Time Interrogation Session: 20240930230945
Implantable Pulse Generator Implant Date: 20240212

## 2023-06-10 ENCOUNTER — Ambulatory Visit (INDEPENDENT_AMBULATORY_CARE_PROVIDER_SITE_OTHER): Payer: MEDICARE

## 2023-06-10 DIAGNOSIS — I639 Cerebral infarction, unspecified: Secondary | ICD-10-CM

## 2023-06-10 DIAGNOSIS — M6281 Muscle weakness (generalized): Secondary | ICD-10-CM | POA: Diagnosis not present

## 2023-06-12 DIAGNOSIS — M6281 Muscle weakness (generalized): Secondary | ICD-10-CM | POA: Diagnosis not present

## 2023-06-19 DIAGNOSIS — M6281 Muscle weakness (generalized): Secondary | ICD-10-CM | POA: Diagnosis not present

## 2023-06-21 DIAGNOSIS — E1169 Type 2 diabetes mellitus with other specified complication: Secondary | ICD-10-CM | POA: Diagnosis not present

## 2023-06-21 DIAGNOSIS — I10 Essential (primary) hypertension: Secondary | ICD-10-CM | POA: Diagnosis not present

## 2023-06-21 DIAGNOSIS — E782 Mixed hyperlipidemia: Secondary | ICD-10-CM | POA: Diagnosis not present

## 2023-06-21 DIAGNOSIS — M6281 Muscle weakness (generalized): Secondary | ICD-10-CM | POA: Diagnosis not present

## 2023-06-21 DIAGNOSIS — Z8673 Personal history of transient ischemic attack (TIA), and cerebral infarction without residual deficits: Secondary | ICD-10-CM | POA: Diagnosis not present

## 2023-06-24 DIAGNOSIS — M6281 Muscle weakness (generalized): Secondary | ICD-10-CM | POA: Diagnosis not present

## 2023-06-25 NOTE — Progress Notes (Signed)
Carelink Summary Report / Loop Recorder 

## 2023-06-26 DIAGNOSIS — M6281 Muscle weakness (generalized): Secondary | ICD-10-CM | POA: Diagnosis not present

## 2023-07-01 DIAGNOSIS — M6281 Muscle weakness (generalized): Secondary | ICD-10-CM | POA: Diagnosis not present

## 2023-07-03 DIAGNOSIS — M6281 Muscle weakness (generalized): Secondary | ICD-10-CM | POA: Diagnosis not present

## 2023-07-10 DIAGNOSIS — M6281 Muscle weakness (generalized): Secondary | ICD-10-CM | POA: Diagnosis not present

## 2023-07-10 LAB — CUP PACEART REMOTE DEVICE CHECK
Date Time Interrogation Session: 20241102230942
Implantable Pulse Generator Implant Date: 20240212

## 2023-07-15 ENCOUNTER — Ambulatory Visit (INDEPENDENT_AMBULATORY_CARE_PROVIDER_SITE_OTHER): Payer: MEDICARE

## 2023-07-15 DIAGNOSIS — I639 Cerebral infarction, unspecified: Secondary | ICD-10-CM

## 2023-07-15 DIAGNOSIS — M6281 Muscle weakness (generalized): Secondary | ICD-10-CM | POA: Diagnosis not present

## 2023-07-17 DIAGNOSIS — M6281 Muscle weakness (generalized): Secondary | ICD-10-CM | POA: Diagnosis not present

## 2023-07-22 DIAGNOSIS — M6281 Muscle weakness (generalized): Secondary | ICD-10-CM | POA: Diagnosis not present

## 2023-07-24 DIAGNOSIS — M6281 Muscle weakness (generalized): Secondary | ICD-10-CM | POA: Diagnosis not present

## 2023-07-29 DIAGNOSIS — M6281 Muscle weakness (generalized): Secondary | ICD-10-CM | POA: Diagnosis not present

## 2023-08-07 DIAGNOSIS — M6281 Muscle weakness (generalized): Secondary | ICD-10-CM | POA: Diagnosis not present

## 2023-08-09 NOTE — Progress Notes (Signed)
Carelink Summary Report / Loop Recorder 

## 2023-08-12 DIAGNOSIS — M6281 Muscle weakness (generalized): Secondary | ICD-10-CM | POA: Diagnosis not present

## 2023-08-14 DIAGNOSIS — M6281 Muscle weakness (generalized): Secondary | ICD-10-CM | POA: Diagnosis not present

## 2023-08-19 ENCOUNTER — Ambulatory Visit (INDEPENDENT_AMBULATORY_CARE_PROVIDER_SITE_OTHER): Payer: MEDICARE

## 2023-08-19 DIAGNOSIS — I639 Cerebral infarction, unspecified: Secondary | ICD-10-CM

## 2023-08-19 DIAGNOSIS — M6281 Muscle weakness (generalized): Secondary | ICD-10-CM | POA: Diagnosis not present

## 2023-08-19 LAB — CUP PACEART REMOTE DEVICE CHECK
Date Time Interrogation Session: 20241215231513
Implantable Pulse Generator Implant Date: 20240212

## 2023-08-20 DIAGNOSIS — R351 Nocturia: Secondary | ICD-10-CM | POA: Diagnosis not present

## 2023-08-20 DIAGNOSIS — R350 Frequency of micturition: Secondary | ICD-10-CM | POA: Diagnosis not present

## 2023-08-21 DIAGNOSIS — M6281 Muscle weakness (generalized): Secondary | ICD-10-CM | POA: Diagnosis not present

## 2023-08-26 DIAGNOSIS — M6281 Muscle weakness (generalized): Secondary | ICD-10-CM | POA: Diagnosis not present

## 2023-08-29 DIAGNOSIS — M6281 Muscle weakness (generalized): Secondary | ICD-10-CM | POA: Diagnosis not present

## 2023-09-23 ENCOUNTER — Ambulatory Visit (INDEPENDENT_AMBULATORY_CARE_PROVIDER_SITE_OTHER): Payer: MEDICARE

## 2023-09-23 DIAGNOSIS — I639 Cerebral infarction, unspecified: Secondary | ICD-10-CM

## 2023-09-23 LAB — CUP PACEART REMOTE DEVICE CHECK
Date Time Interrogation Session: 20250119232108
Implantable Pulse Generator Implant Date: 20240212

## 2023-09-24 NOTE — Progress Notes (Signed)
Carelink Summary Report / Loop Recorder 

## 2023-10-28 ENCOUNTER — Ambulatory Visit (INDEPENDENT_AMBULATORY_CARE_PROVIDER_SITE_OTHER): Payer: MEDICARE

## 2023-10-28 DIAGNOSIS — I639 Cerebral infarction, unspecified: Secondary | ICD-10-CM | POA: Diagnosis not present

## 2023-10-30 LAB — CUP PACEART REMOTE DEVICE CHECK
Date Time Interrogation Session: 20250223231802
Implantable Pulse Generator Implant Date: 20240212

## 2023-10-31 NOTE — Progress Notes (Signed)
 Carelink Summary Report / Loop Recorder

## 2023-12-02 ENCOUNTER — Ambulatory Visit (INDEPENDENT_AMBULATORY_CARE_PROVIDER_SITE_OTHER): Payer: MEDICARE

## 2023-12-02 DIAGNOSIS — E538 Deficiency of other specified B group vitamins: Secondary | ICD-10-CM | POA: Diagnosis not present

## 2023-12-02 DIAGNOSIS — R2689 Other abnormalities of gait and mobility: Secondary | ICD-10-CM | POA: Diagnosis not present

## 2023-12-02 DIAGNOSIS — R351 Nocturia: Secondary | ICD-10-CM | POA: Diagnosis not present

## 2023-12-02 DIAGNOSIS — I639 Cerebral infarction, unspecified: Secondary | ICD-10-CM

## 2023-12-02 DIAGNOSIS — R413 Other amnesia: Secondary | ICD-10-CM | POA: Diagnosis not present

## 2023-12-03 LAB — CUP PACEART REMOTE DEVICE CHECK
Date Time Interrogation Session: 20250330231427
Implantable Pulse Generator Implant Date: 20240212

## 2023-12-04 NOTE — Progress Notes (Signed)
 Carelink Summary Report / Loop Recorder

## 2023-12-04 NOTE — Addendum Note (Signed)
 Addended by: Geralyn Flash D on: 12/04/2023 11:32 AM   Modules accepted: Orders

## 2024-01-06 ENCOUNTER — Ambulatory Visit (INDEPENDENT_AMBULATORY_CARE_PROVIDER_SITE_OTHER): Payer: MEDICARE

## 2024-01-06 DIAGNOSIS — I639 Cerebral infarction, unspecified: Secondary | ICD-10-CM

## 2024-01-06 LAB — CUP PACEART REMOTE DEVICE CHECK
Date Time Interrogation Session: 20250504232720
Implantable Pulse Generator Implant Date: 20240212

## 2024-01-16 NOTE — Addendum Note (Signed)
 Addended by: Edra Govern D on: 01/16/2024 11:22 AM   Modules accepted: Orders

## 2024-01-16 NOTE — Progress Notes (Signed)
 Carelink Summary Report / Loop Recorder

## 2024-01-30 DIAGNOSIS — E1165 Type 2 diabetes mellitus with hyperglycemia: Secondary | ICD-10-CM | POA: Diagnosis not present

## 2024-01-30 DIAGNOSIS — M810 Age-related osteoporosis without current pathological fracture: Secondary | ICD-10-CM | POA: Diagnosis not present

## 2024-01-30 DIAGNOSIS — I10 Essential (primary) hypertension: Secondary | ICD-10-CM | POA: Diagnosis not present

## 2024-02-04 DIAGNOSIS — G629 Polyneuropathy, unspecified: Secondary | ICD-10-CM | POA: Diagnosis not present

## 2024-02-04 DIAGNOSIS — R413 Other amnesia: Secondary | ICD-10-CM | POA: Diagnosis not present

## 2024-02-04 DIAGNOSIS — I10 Essential (primary) hypertension: Secondary | ICD-10-CM | POA: Diagnosis not present

## 2024-02-04 DIAGNOSIS — M810 Age-related osteoporosis without current pathological fracture: Secondary | ICD-10-CM | POA: Diagnosis not present

## 2024-02-04 DIAGNOSIS — I8393 Asymptomatic varicose veins of bilateral lower extremities: Secondary | ICD-10-CM | POA: Diagnosis not present

## 2024-02-04 DIAGNOSIS — E118 Type 2 diabetes mellitus with unspecified complications: Secondary | ICD-10-CM | POA: Diagnosis not present

## 2024-02-04 DIAGNOSIS — I700 Atherosclerosis of aorta: Secondary | ICD-10-CM | POA: Diagnosis not present

## 2024-02-04 DIAGNOSIS — Z8673 Personal history of transient ischemic attack (TIA), and cerebral infarction without residual deficits: Secondary | ICD-10-CM | POA: Diagnosis not present

## 2024-02-04 DIAGNOSIS — N1831 Chronic kidney disease, stage 3a: Secondary | ICD-10-CM | POA: Diagnosis not present

## 2024-02-04 DIAGNOSIS — Z0000 Encounter for general adult medical examination without abnormal findings: Secondary | ICD-10-CM | POA: Diagnosis not present

## 2024-02-06 ENCOUNTER — Ambulatory Visit (INDEPENDENT_AMBULATORY_CARE_PROVIDER_SITE_OTHER): Payer: MEDICARE

## 2024-02-06 DIAGNOSIS — I639 Cerebral infarction, unspecified: Secondary | ICD-10-CM

## 2024-02-06 DIAGNOSIS — R413 Other amnesia: Secondary | ICD-10-CM | POA: Diagnosis not present

## 2024-02-06 LAB — CUP PACEART REMOTE DEVICE CHECK
Date Time Interrogation Session: 20250604232834
Implantable Pulse Generator Implant Date: 20240212

## 2024-02-14 ENCOUNTER — Ambulatory Visit: Payer: Self-pay | Admitting: Cardiology

## 2024-02-24 NOTE — Progress Notes (Signed)
 Carelink Summary Report / Loop Recorder

## 2024-03-09 ENCOUNTER — Ambulatory Visit (INDEPENDENT_AMBULATORY_CARE_PROVIDER_SITE_OTHER): Payer: MEDICARE

## 2024-03-09 DIAGNOSIS — I639 Cerebral infarction, unspecified: Secondary | ICD-10-CM

## 2024-03-09 LAB — CUP PACEART REMOTE DEVICE CHECK
Date Time Interrogation Session: 20250706233647
Implantable Pulse Generator Implant Date: 20240212

## 2024-03-14 ENCOUNTER — Other Ambulatory Visit: Payer: Self-pay

## 2024-03-14 ENCOUNTER — Emergency Department (HOSPITAL_COMMUNITY): Payer: MEDICARE

## 2024-03-14 ENCOUNTER — Encounter (HOSPITAL_COMMUNITY): Payer: Self-pay | Admitting: Internal Medicine

## 2024-03-14 ENCOUNTER — Observation Stay (HOSPITAL_COMMUNITY)
Admission: EM | Admit: 2024-03-14 | Discharge: 2024-03-15 | Disposition: A | Discharge status: Home Health | Payer: MEDICARE | Attending: Internal Medicine | Admitting: Internal Medicine

## 2024-03-14 DIAGNOSIS — M6281 Muscle weakness (generalized): Secondary | ICD-10-CM | POA: Diagnosis not present

## 2024-03-14 DIAGNOSIS — R531 Weakness: Secondary | ICD-10-CM | POA: Diagnosis not present

## 2024-03-14 DIAGNOSIS — F0390 Unspecified dementia without behavioral disturbance: Secondary | ICD-10-CM | POA: Insufficient documentation

## 2024-03-14 DIAGNOSIS — I129 Hypertensive chronic kidney disease with stage 1 through stage 4 chronic kidney disease, or unspecified chronic kidney disease: Secondary | ICD-10-CM | POA: Diagnosis not present

## 2024-03-14 DIAGNOSIS — D72829 Elevated white blood cell count, unspecified: Secondary | ICD-10-CM | POA: Insufficient documentation

## 2024-03-14 DIAGNOSIS — R1310 Dysphagia, unspecified: Secondary | ICD-10-CM | POA: Insufficient documentation

## 2024-03-14 DIAGNOSIS — E1140 Type 2 diabetes mellitus with diabetic neuropathy, unspecified: Secondary | ICD-10-CM | POA: Insufficient documentation

## 2024-03-14 DIAGNOSIS — E785 Hyperlipidemia, unspecified: Secondary | ICD-10-CM | POA: Diagnosis not present

## 2024-03-14 DIAGNOSIS — I6389 Other cerebral infarction: Principal | ICD-10-CM | POA: Insufficient documentation

## 2024-03-14 DIAGNOSIS — Z7984 Long term (current) use of oral hypoglycemic drugs: Secondary | ICD-10-CM | POA: Insufficient documentation

## 2024-03-14 DIAGNOSIS — R29898 Other symptoms and signs involving the musculoskeletal system: Principal | ICD-10-CM

## 2024-03-14 DIAGNOSIS — R231 Pallor: Secondary | ICD-10-CM | POA: Diagnosis not present

## 2024-03-14 DIAGNOSIS — Z7982 Long term (current) use of aspirin: Secondary | ICD-10-CM | POA: Insufficient documentation

## 2024-03-14 DIAGNOSIS — I959 Hypotension, unspecified: Secondary | ICD-10-CM | POA: Diagnosis not present

## 2024-03-14 DIAGNOSIS — I639 Cerebral infarction, unspecified: Principal | ICD-10-CM | POA: Diagnosis present

## 2024-03-14 DIAGNOSIS — Z79899 Other long term (current) drug therapy: Secondary | ICD-10-CM | POA: Diagnosis not present

## 2024-03-14 DIAGNOSIS — N1831 Chronic kidney disease, stage 3a: Secondary | ICD-10-CM | POA: Diagnosis not present

## 2024-03-14 DIAGNOSIS — E1122 Type 2 diabetes mellitus with diabetic chronic kidney disease: Secondary | ICD-10-CM | POA: Diagnosis not present

## 2024-03-14 LAB — COMPREHENSIVE METABOLIC PANEL WITH GFR
ALT: 24 U/L (ref 0–44)
AST: 27 U/L (ref 15–41)
Albumin: 3.8 g/dL (ref 3.5–5.0)
Alkaline Phosphatase: 36 U/L — ABNORMAL LOW (ref 38–126)
Anion gap: 14 (ref 5–15)
BUN: 26 mg/dL — ABNORMAL HIGH (ref 8–23)
CO2: 25 mmol/L (ref 22–32)
Calcium: 10.1 mg/dL (ref 8.9–10.3)
Chloride: 100 mmol/L (ref 98–111)
Creatinine, Ser: 1.65 mg/dL — ABNORMAL HIGH (ref 0.44–1.00)
GFR, Estimated: 31 mL/min — ABNORMAL LOW (ref >60)
Glucose, Bld: 110 mg/dL — ABNORMAL HIGH (ref 70–99)
Potassium: 4.2 mmol/L (ref 3.5–5.1)
Sodium: 139 mmol/L (ref 135–145)
Total Bilirubin: 1 mg/dL (ref 0.0–1.2)
Total Protein: 7.4 g/dL (ref 6.5–8.1)

## 2024-03-14 LAB — URINALYSIS, ROUTINE W REFLEX MICROSCOPIC
Bilirubin Urine: NEGATIVE
Glucose, UA: NEGATIVE mg/dL
Hgb urine dipstick: NEGATIVE
Ketones, ur: NEGATIVE mg/dL
Nitrite: NEGATIVE
Protein, ur: NEGATIVE mg/dL
Specific Gravity, Urine: 1.018 (ref 1.005–1.030)
pH: 5 (ref 5.0–8.0)

## 2024-03-14 LAB — CBC
HCT: 41.4 % (ref 36.0–46.0)
HCT: 35.4 % — ABNORMAL LOW (ref 36.0–46.0)
Hemoglobin: 13.8 g/dL (ref 12.0–15.0)
Hemoglobin: 12 g/dL (ref 12.0–15.0)
MCH: 31.2 pg (ref 26.0–34.0)
MCH: 31.1 pg (ref 26.0–34.0)
MCHC: 33.3 g/dL (ref 30.0–36.0)
MCHC: 33.9 g/dL (ref 30.0–36.0)
MCV: 93.7 fL (ref 80.0–100.0)
MCV: 91.7 fL (ref 80.0–100.0)
Platelets: 255 K/uL (ref 150–400)
Platelets: 221 K/uL (ref 150–400)
RBC: 4.42 MIL/uL (ref 3.87–5.11)
RBC: 3.86 MIL/uL — ABNORMAL LOW (ref 3.87–5.11)
RDW: 12.9 % (ref 11.5–15.5)
RDW: 13 % (ref 11.5–15.5)
WBC: 15.2 K/uL — ABNORMAL HIGH (ref 4.0–10.5)
WBC: 10.3 K/uL (ref 4.0–10.5)
nRBC: 0 % (ref 0.0–0.2)
nRBC: 0 % (ref 0.0–0.2)

## 2024-03-14 LAB — PROTIME-INR
INR: 1 (ref 0.8–1.2)
Prothrombin Time: 14 s (ref 11.4–15.2)

## 2024-03-14 LAB — CREATININE, SERUM
Creatinine, Ser: 1.37 mg/dL — ABNORMAL HIGH (ref 0.44–1.00)
GFR, Estimated: 39 mL/min — ABNORMAL LOW (ref >60)

## 2024-03-14 LAB — CBG MONITORING, ED: Glucose-Capillary: 100 mg/dL — ABNORMAL HIGH (ref 70–99)

## 2024-03-14 MED ORDER — SODIUM CHLORIDE 0.9 % IV SOLN: INTRAVENOUS | Status: DC

## 2024-03-14 MED ORDER — LORAZEPAM 2 MG/ML IJ SOLN
1.0000 mg | Freq: Once | INTRAMUSCULAR | Status: AC | PRN
Start: 2024-03-14 — End: 2024-03-14
  Administered 2024-03-14: 1 mg via INTRAVENOUS
  Filled 2024-03-14: qty 1

## 2024-03-14 MED ORDER — MELATONIN 5 MG PO TABS: 5.0000 mg | ORAL_TABLET | Freq: Every evening | ORAL | Status: DC | PRN

## 2024-03-14 MED ORDER — ACETAMINOPHEN 325 MG PO TABS: 650.0000 mg | ORAL_TABLET | Freq: Four times a day (QID) | ORAL | Status: DC | PRN

## 2024-03-14 MED ORDER — ATORVASTATIN CALCIUM 40 MG PO TABS
40.0000 mg | ORAL_TABLET | Freq: Every day | ORAL | Status: DC
  Administered 2024-03-15: 40 mg via ORAL
  Filled 2024-03-14: qty 1

## 2024-03-14 MED ORDER — CLOPIDOGREL BISULFATE 300 MG PO TABS
300.0000 mg | ORAL_TABLET | Freq: Once | ORAL | Status: AC
  Administered 2024-03-14: 300 mg via ORAL
  Filled 2024-03-14: qty 1

## 2024-03-14 MED ORDER — POLYETHYLENE GLYCOL 3350 17 G PO PACK: 17.0000 g | PACK | Freq: Every day | ORAL | Status: DC | PRN

## 2024-03-14 MED ORDER — PROCHLORPERAZINE EDISYLATE 10 MG/2ML IJ SOLN: 5.0000 mg | Freq: Four times a day (QID) | INTRAMUSCULAR | Status: DC | PRN

## 2024-03-14 MED ORDER — CLOPIDOGREL BISULFATE 75 MG PO TABS
75.0000 mg | ORAL_TABLET | Freq: Every day | ORAL | Status: DC
  Administered 2024-03-15: 75 mg via ORAL
  Filled 2024-03-14: qty 1

## 2024-03-14 MED ORDER — IOHEXOL 350 MG/ML SOLN
60.0000 mL | Freq: Once | INTRAVENOUS | Status: AC | PRN
  Administered 2024-03-14: 60 mL via INTRAVENOUS

## 2024-03-14 MED ORDER — ENOXAPARIN SODIUM 30 MG/0.3ML IJ SOSY
30.0000 mg | PREFILLED_SYRINGE | INTRAMUSCULAR | Status: DC
  Administered 2024-03-14: 30 mg via SUBCUTANEOUS
  Filled 2024-03-14: qty 0.3

## 2024-03-14 MED ORDER — STROKE: EARLY STAGES OF RECOVERY BOOK
Freq: Once | Status: AC
  Filled 2024-03-14: qty 1

## 2024-03-14 MED ORDER — ASPIRIN 81 MG PO TBEC
81.0000 mg | DELAYED_RELEASE_TABLET | Freq: Every day | ORAL | Status: DC
  Administered 2024-03-15: 81 mg via ORAL
  Filled 2024-03-14: qty 1

## 2024-03-14 MED ORDER — SODIUM CHLORIDE 0.9 % IV BOLUS
1000.0000 mL | Freq: Once | INTRAVENOUS | Status: AC
  Administered 2024-03-14: 1000 mL via INTRAVENOUS

## 2024-03-14 NOTE — ED Triage Notes (Addendum)
 Pt BIB EMS for bilateral leg weakness, hx of stroke over a year ago that started with the same symptoms. Says [place] [employer] and started feeling weak in her legs and off balance, bystanders took her home and she said she felt better but then she called 911 from home and said she was weak again. Aox4. Baseline facial droop and left leg more weak normally. LKW 1125  BP w EMS 134/72 laying down, 84/40 sitting up

## 2024-03-14 NOTE — ED Provider Notes (Signed)
  Physical Exam  BP 119/60   Pulse 78   Temp 98.8 F (37.1 C) (Oral)   Resp 14   SpO2 92%   Physical Exam Vitals reviewed.  Constitutional:      General: She is not in acute distress.    Appearance: She is not toxic-appearing or diaphoretic.  HENT:     Head: Normocephalic.  Eyes:     Extraocular Movements: Extraocular movements intact.     Conjunctiva/sclera: Conjunctivae normal.  Cardiovascular:     Rate and Rhythm: Normal rate and regular rhythm.  Pulmonary:     Effort: Pulmonary effort is normal. No respiratory distress.  Neurological:     Mental Status: She is alert. Mental status is at baseline.    ED Course / MDM   Clinical Course as of 03/15/24 1232  Sat Mar 14, 2024  1517 S. Difficulty walking, new RLE weakness, chronic LLE weakness from prior CVA. Neuro consulted, ambulated fine for them. CTH wnl.  - AKI to 1.65 from ~1.0. Getting fluids now - Pending UA. Likely add on MRI [AD]  1541 Spoke with neurology consult again, they rec MRI, likely add on CTA head/neck, resume DAPT, can do outpatient echo and f/u if scans here are reassuring [AD]  1850 CT ANGIO HEAD NECK W WO CM Critical stenosis versus occlusion of the left P1 and P2 segments of the PCA Severe stenosis of the distal M1 segment of the right MCA Moderate to severe stenoses of M2 and M3 branches of the left MCA. Mild calcific atheromatous disease within the cervical internal carotid arteries without significant stenosis.   [AD]  1850 MR BRAIN WO CONTRAST Acute nonhemorrhagic infarct within the left mid frontal lobe. Chronic encephalomalacia changes within the right occipital and right medial parietal lobes. Moderate periventricular white matter disease.   [AD]    Clinical Course User Index [AD] Beth Rake, MD   Medical Decision Making As above in ED course, patient initially presented for onset of new RLE weakness and difficulty ambulating around 1130 today. At time of prior ED provider  evaluation and neurology consult evaluation, patient's gait appeared to be at baseline. Initial CT head wo was negative for acute findings and other workup was notable for AKI for which the patient is getting fluids. At time of handoff, patient was pending completion of fluids, results of UA, and consideration of adding MRI to workup.  I added MRI brain to workup and discussed patient with neurology team as above shortly after handoff, who recommended adding on CTA head/neck and resuming plavix  for DAPT, admit if imaging c/f stroke and otherwise safe for discharge with outpatient echo if further imaging is negative.   Patient's MRI resulted as above with an acute ischemic infarct of the L mid frontal lobe, and CTA resulted with critical stenosis vs occlusion of the left P1/P2 segments of PCA and severe stenosis of the distal M1 segment of the right MCA. Patient and family were updated on these findings. She was given a plavix  load and was admitted for further stroke workup and AKI management.  Amount and/or Complexity of Data Reviewed Labs: ordered. Radiology: ordered. Decision-making details documented in ED Course.  Risk Prescription drug management. Decision regarding hospitalization.       Beth Rake, MD 03/15/24 1241    Beth Alm Macho, MD 03/15/24 1515

## 2024-03-14 NOTE — ED Notes (Signed)
 Received report from previous RN.  Pt resting, no distress noted. Denies needs.

## 2024-03-14 NOTE — Plan of Care (Signed)
 Discussed with ED team.  Patient with possibly fluctuating right-sided leg weakness.  Given her age and risk factors, certainly at risk for stuttering lacunar stroke  Patient expresses a strong wish to leave the hospital  Would at minimum pursue MRI brain without contrast and CTA head and neck to confirm no significant vascular stenoses and no acute stroke  If these studies are reassuring with discharge on dual antiplatelet therapy (aspirin  81 daily and plavix  75 mg daily x 21 days), close PCP follow-up for consideration of ECHO and loop recorder interrogation  If positive discuss with neurologist on call  Lola Jernigan MD-PhD [facility] [phone]

## 2024-03-14 NOTE — ED Notes (Signed)
 MD Margo Aye at bedside

## 2024-03-14 NOTE — ED Provider Notes (Signed)
 [place] EMERGENCY DEPARTMENT AT [facility] Provider Note   CSN: [id number] Arrival date & time: 03/14/24  1222     Patient presents with: Extremity Weakness   Beth Macdonald is a 78 y.o. female.   Patient is brought to the emergency department after becoming weak today while at the [employer].  Patient states that she feels fine.  Patient's wife tells me that patient was having difficulty walking.  She reports that patient was not using her right leg normally she states patient was having difficulty lifting her leg and difficulty taking a step.  Patient has a past medical history of a CVA and has had some left sided weakness since a previous CVA.  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  Patient states that her smile is never symmetric.  Patient has had a previous TIA.  Patient's wife reports patient does have a history of dementia.  She has problems with her balance on a daily basis.  Patient does use a walker and sometimes uses a cane.  Patient denies any fever or chills she is not having any chest or abdominal pain.  The history is provided by the patient. No language interpreter was used.  Extremity Weakness       Prior to Admission medications   Medication Sig Start Date End Date Taking? Authorizing Provider  aspirin  EC 81 MG tablet Take 1 tablet (81 mg total) by mouth daily. Swallow whole. 06/06/22   Rudy Sieving, MD  atorvastatin  (LIPITOR) 20 MG tablet Take 1 tablet (20 mg total) by mouth daily. Hold until you are taking Paxlovid  11/16/22   Caleen Qualia, MD  benzonatate  (TESSALON ) 100 MG capsule Take 1 capsule (100 mg total) by mouth 3 (three) times daily. 11/16/22   Amin, Sumayya, MD  Calcium  Carbonate Antacid (TUMS PO) Take 1 tablet by mouth daily as needed (acid control).    [provider]  Clobetasol  Prop Emollient Base (CLOBETASOL  PROPIONATE E) 0.05 % emollient cream Apply 1 Application topically 2  (two) times daily. Patient taking differently: Apply 1 Application topically 2 (two) times daily as needed (for rash). 04/18/22   Sheffield, Kelli R, PA-C  cycloSPORINE  (RESTASIS ) 0.05 % ophthalmic emulsion Place 1 drop into both eyes 2 (two) times daily.    [provider]  desonide  (DESOWEN ) 0.05 % cream Apply topically 2 (two) times daily as needed (Rash). 03/22/21   Sheffield, Kelli R, PA-C  ibuprofen  (ADVIL ) 800 MG tablet Take 1 tablet (800 mg total) by mouth every 6 (six) hours as needed for mild pain or moderate pain. 06/05/22   Rudy Sieving, MD  lisinopril  (ZESTRIL ) 10 MG tablet Take 10 mg by mouth daily. 12/30/20   [provider]  metFORMIN  (GLUCOPHAGE ) 500 MG tablet Take 1,000 mg by mouth 2 (two) times daily with a meal. 12/14/18   [provider]  ondansetron  (ZOFRAN ) 4 MG tablet Take 1 tablet (4 mg total) by mouth every 6 (six) hours as needed for nausea. 01/02/22   Edmisten, Kristie L, PA  Polyethyl Glycol-Propyl Glycol (SYSTANE FREE OP) Apply 1 drop to eye daily as needed (dryness/irritation).    [provider]  REPATHA  SURECLICK 140 MG/ML SOAJ Inject 140 mg into the skin every 14 (fourteen) days. Sunday 10/09/21   [provider]  Semaglutide ,0.25 or 0.5MG /DOS, 2 MG/3ML SOPN Inject 0.5 mg into the skin once a week. Tuesday    [provider]    Allergies: Clindamycin/lincomycin, Milk-related  compounds, and Tetracyclines & related    Review of Systems  Musculoskeletal:  Positive for extremity weakness.  All other systems reviewed and are negative.   Updated Vital Signs BP 119/60   Pulse 78   Temp 98.8 F (37.1 C) (Oral)   Resp 14   SpO2 92%   Physical Exam Vitals and nursing note reviewed.  Constitutional:      Appearance: She is well-developed.  HENT:     Head: Normocephalic.     Right Ear: External ear normal.     Left Ear: External ear normal.     Nose: Nose normal.     Mouth/Throat:     Mouth: Mucous membranes  are moist.     Comments: Asymmetric smile, Eyes:     Extraocular Movements: Extraocular movements intact.     Conjunctiva/sclera: Conjunctivae normal.     Pupils: Pupils are equal, round, and reactive to light.  Cardiovascular:     Rate and Rhythm: Normal rate and regular rhythm.  Pulmonary:     Effort: Pulmonary effort is normal.  Abdominal:     General: Abdomen is flat. There is no distension.  Musculoskeletal:        General: Normal range of motion.     Cervical back: Normal range of motion.  Skin:    General: Skin is warm.  Neurological:     General: No focal deficit present.     Mental Status: She is alert.     Comments: Pt able to ambulate.  Pt requires minimal support.    Psychiatric:        Mood and Affect: Mood normal.     (all labs ordered are listed, but only abnormal results are displayed) Labs Reviewed  COMPREHENSIVE METABOLIC PANEL WITH GFR - Abnormal; Notable for the following components:      Result Value   Glucose, Bld 110 (*)    BUN 26 (*)    Creatinine, Ser 1.65 (*)    Alkaline Phosphatase 36 (*)    GFR, Estimated 31 (*)    All other components within normal limits  CBC - Abnormal; Notable for the following components:   WBC 15.2 (*)    All other components within normal limits  CBG MONITORING, ED - Abnormal; Notable for the following components:   Glucose-Capillary 100 (*)    All other components within normal limits  PROTIME-INR  URINALYSIS, ROUTINE W REFLEX MICROSCOPIC    EKG: EKG Interpretation Date/Time:  Saturday March 14 2024 13:22:16 EDT Ventricular Rate:  69 PR Interval:  174 QRS Duration:  84 QT Interval:  390 QTC Calculation: 417 R Axis:   50  Text Interpretation: Normal sinus rhythm Normal ECG When compared with ECG of 14-Nov-2022 18:08, PREVIOUS ECG IS PRESENT Confirmed by Patt Alm DEL [id number]) on 03/14/2024 3:18:07 PM  Radiology: CT Head Wo Contrast Result Date: 03/14/2024 CLINICAL DATA:  Neuro deficit, acute, stroke suspected  EXAM: CT HEAD WITHOUT CONTRAST TECHNIQUE: Contiguous axial images were obtained from the base of the skull through the vertex without intravenous contrast. RADIATION DOSE REDUCTION: This exam was performed according to the departmental dose-optimization program which includes automated exposure control, adjustment of the mA and/or kV according to patient size and/or use of iterative reconstruction technique. COMPARISON:  CT the head dated November 15, 2022. FINDINGS: Brain: Age-related cerebral volume loss. Interval development of focal encephalomalacia changes within the right occipital lobe. Subtle encephalomalacia changes are also present in the right parietal lobe. Moderate diffuse cerebral white matter disease.  No evidence of hemorrhage, mass, acute cortical infarct or hydrocephalus. Vascular: Mild calcific plaque within the carotid siphons. Skull: Intact and unremarkable. Sinuses/Orbits: Polypoid mucosal disease within the right maxillary sinus. The paranasal sinuses are otherwise clear. Status post bilateral lens replacement. Other: None. IMPRESSION: 1. Right occipital and parietal encephalomalacia and moderate diffuse cerebral white matter disease. No apparent acute process. Electronically Signed   By: Evalene Coho M.D.   On: 03/14/2024 15:10     Procedures   Medications Ordered in the ED  sodium chloride  0.9 % bolus 1,000 mL (has no administration in time range)  LORazepam  (ATIVAN ) injection 1 mg (has no administration in time range)    Clinical Course as of 03/14/24 1545  Sat Mar 14, 2024  1517 S. Difficulty walking, new RLE weakness, chronic LLE weakness from prior CVA. Neuro consulted, ambulated fine for them. CTH wnl.  - AKI to 1.65 from ~1.0. Getting fluids now - Pending UA. Likely add on MRI [AD]  1541 Rec MRI, likely add on CTA head/neck, resume DAPT, can do outpatient echo and f/u if scans here are reassuring [AD]    Clinical Course User Index [AD] Raoul Rake, MD                                  Medical Decision Making Patient's wife reports patient had decreased ability to move her right leg.  [place] [employer] [place] and patient had trouble walking.  She required assistance to get into car and to get to the emergency department.  Amount and/or Complexity of Data Reviewed Independent Historian: spouse    Details: Patient's spouse gives a history that patient has some dementia. Labs: ordered. Decision-making details documented in ED Course.    Details: CBC shows white blood cell count 15.2.   Radiology: ordered and independent interpretation performed. Decision-making details documented in ED Course.    Details: Ct head shows right occipital and parietal encephalomalacia and moderate diffuse cerebral vascular matter disease Discussion of management or test interpretation with external provider(s): Pt seem by Dr. Jerrie who advised not a code stroke.    Risk Prescription drug management. Risk Details: Pt's care turned over to Dr. Patt and Dr. Molly.  Mri and labs pending         Final diagnoses:  Right leg weakness  Generalized weakness    ED Discharge Orders     None          Fl[place] Sonny MARLA DEVONNA 03/14/24 1549    Doretha Folks, MD 03/15/24 0730

## 2024-03-14 NOTE — ED Notes (Addendum)
 Pt taken to MRI. Fluids paused while in MRI. Will re start when she gets back.

## 2024-03-14 NOTE — H&P (Signed)
 History and Physical  Beth Macdonald:[id number] DOB: 1946-02-13 DOA: 03/14/2024  Referring physician: Isaiah Minion, PA-EDP  PCP: Clarice Nottingham, MD  Outpatient Specialists: None Patient coming from: Home with her spouse.  Chief Complaint: R leg weakness   HPI: Beth Macdonald is a 78 y.o. female with medical history significant for prior CVA, ambulatory dysfunction with abnormal balance, status post implanted loop recorder (5-second episode of VT detected that was 176 bpm 06/02/2023), hypertension, type 2 diabetes, hyperlipidemia, CKD 3A, history of facial paralysis/Bell's palsy, who presents to the ER due to right leg weakness and unsteady gait.  Last known well around 11:30 AM.  Due to persistent symptoms she activated EMS and was brought into the ER for further evaluation.  In the ER, her right lower extremity weakness improved.  Seen by neurology/stroke team.  Stat CT head was nonacute.  Further stroke workup revealed acute nonhemorrhagic infarct within the left mid frontal lobe seen on noncontrast brain MRI.  CT angio head and neck revealed critical stenosis versus occlusion of the left P1 and P2 segments of the posterior cerebral artery.  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  Moderate to severe stenosis of the M2 and M3 branches of the left middle cerebral artery.  Mild calcific atheromatous disease within the cervical internal carotid arteries without significant stenosis.  EDP requested admission for further management of acute CVA.  ED Course: Temperature 98.8.  BP 190/60, pulse 78, respiration rate 14, O2 saturation 92% on room air.  Lab studies notable for BUN 26, creatinine 1.65, GFR 31.  Glucose 110.  WBC 15.2.  Rare bacteria on UA.  Review of Systems: Review of systems as noted in the HPI. All other systems reviewed and are negative.   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/22/2021   Right forehead (in situ)   TIA (transient ischemic attack) 2020   Varicose vein of leg    Past Surgical History:  Procedure Laterality Date   ELBOW SURGERY Bilateral    20 yrs ago   ENDOVENOUS ABLATION SAPHENOUS VEIN W/ LASER Bilateral    ENDOVENOUS ABLATION SAPHENOUS VEIN W/ LASER Left 11/19/2019   endovenous laser ablation anterior accessory branch of left GSV and stab phlebectomy> 20 incisions left leg by Medford Blade MD    shoulder Bilateral    [phone]   TOENAIL EXCISION Right 2019   Big toe   TONSILLECTOMY Bilateral    TOTAL KNEE ARTHROPLASTY Right 01/01/2022   Procedure: TOTAL KNEE ARTHROPLASTY;  Surgeon: Melodi Lerner, MD;  Location: [facility];  Service: Orthopedics;  Laterality: Right;    Social History:  reports that she quit smoking about 45 years ago. Her smoking use included cigarettes. She started smoking about 65 years ago. She has a 20 pack-year smoking history. She has never used smokeless tobacco. She reports current alcohol use. She reports that she does not use drugs.   Allergies  Allergen Reactions   Clindamycin/Lincomycin Other (See Comments)    Cannot tolerate mycins causes C-Diff   Milk-Related Compounds Diarrhea   Tetracyclines & Related Other (See Comments)    Feel really nervous    Family History  Problem Relation Age of Onset   Stroke Mother    Hypertension Father    Dementia Father    Leukemia Brother    Stroke Brother    Colon cancer Neg Hx    Esophageal cancer Neg Hx    Pancreatic cancer Neg Hx    Stomach cancer Neg Hx       Prior  to Admission medications   Medication Sig Start Date End Date Taking? Authorizing Provider  aspirin  EC 81 MG tablet Take 1 tablet (81 mg total) by mouth daily. Swallow whole. 06/06/22   Rudy Sieving, MD  atorvastatin  (LIPITOR) 20 MG tablet Take 1 tablet (20 mg total) by mouth daily. Hold until you are taking Paxlovid  11/16/22   Amin, Sumayya, MD  benzonatate  (TESSALON ) 100 MG capsule Take 1 capsule (100 mg total) by mouth 3 (three) times daily. 11/16/22   Amin, Sumayya, MD  Calcium  Carbonate Antacid (TUMS PO) Take 1 tablet by mouth daily as needed (acid control).    [provider]  Clobetasol  Prop Emollient Base (CLOBETASOL  PROPIONATE E) 0.05 % emollient cream Apply 1 Application topically 2 (two) times daily. Patient taking differently: Apply 1 Application topically 2 (two) times daily as needed (for rash). 04/18/22   Sheffield, Kelli R, PA-C  cycloSPORINE  (RESTASIS ) 0.05 % ophthalmic emulsion Place 1 drop into both eyes 2 (two) times daily.    [provider]  desonide  (DESOWEN ) 0.05 % cream Apply topically 2 (two) times daily as needed (Rash). 03/22/21   Sheffield, Kelli R, PA-C  ibuprofen  (ADVIL ) 800 MG tablet Take 1 tablet (800 mg total) by mouth every 6 (six) hours as needed for mild pain or moderate pain. 06/05/22   Rudy Sieving, MD  lisinopril  (ZESTRIL ) 10 MG tablet Take 10 mg by mouth daily. 12/30/20   [provider]  metFORMIN  (GLUCOPHAGE ) 500 MG tablet Take 1,000 mg by mouth 2 (two) times daily with a meal. 12/14/18   [provider]  ondansetron  (ZOFRAN ) 4 MG tablet Take 1 tablet (4 mg total) by mouth every 6 (six) hours as needed for nausea. 01/02/22   Edmisten, Kristie L, PA  Polyethyl Glycol-Propyl Glycol (SYSTANE FREE OP) Apply 1 drop to eye daily as needed (dryness/irritation).    [provider]  REPATHA  SURECLICK 140 MG/ML SOAJ Inject 140 mg into the skin every 14 (fourteen) days. Sunday 10/09/21   [provider]  Semaglutide ,0.25 or  0.5MG /DOS, 2 MG/3ML SOPN Inject 0.5 mg into the skin once a week. Tuesday    [provider]    Physical Exam: BP 119/60   Pulse 78   Temp 98.8 F (37.1 C) (Oral)   Resp 14   SpO2 92%   General: 78 y.o. year-old female well developed well nourished in no acute distress.  Alert and oriented x3. Cardiovascular: Regular rate and rhythm with no rubs or gallops.  No thyromegaly or JVD noted.  No lower extremity edema. 2/4 pulses in all 4 extremities. Respiratory: Clear to auscultation with no wheezes or rales. Good inspiratory effort. Abdomen: Soft nontender nondistended with normal bowel sounds x4 quadrants. Muskuloskeletal: No cyanosis, clubbing or edema noted bilaterally Neuro: CN II-XII intact, mildly decreased strength in right lower extremity, sensation, reflexes Skin: No ulcerative lesions noted or rashes Psychiatry:  Judgement and insight appear normal. Mood is appropriate for condition and setting          Labs on Admission:  Basic Metabolic Panel: Recent Labs  Lab 03/14/24 1239  NA 139  K 4.2  CL 100  CO2 25  GLUCOSE 110*  BUN 26*  CREATININE 1.65*  CALCIUM  10.1   Liver Function Tests: Recent Labs  Lab 03/14/24 1239  AST 27  ALT 24  ALKPHOS 36*  BILITOT 1.0  PROT 7.4  ALBUMIN 3.8   No results for input(s): LIPASE, AMYLASE in the last 168 hours. No results for input(s): AMMONIA in the last 168 hours. CBC: Recent Labs  Lab 03/14/24 1239  WBC 15.2*  HGB 13.8  HCT 41.4  MCV 93.7  PLT 255   Cardiac Enzymes: No results for input(s): CKTOTAL, CKMB, CKMBINDEX, TROPONINI in the last 168 hours.  BNP (last 3 results) No results for input(s): BNP in the last 8760 hours.  ProBNP (last 3 results) No results for input(s): PROBNP in the last 8760 hours.  CBG: Recent Labs  Lab 03/14/24 1328  GLUCAP 100*    Radiological Exams on Admission: CT ANGIO HEAD NECK W WO CM Result Date: 03/14/2024 CLINICAL DATA:  Stroke/TIA, determine  embolic source EXAM: CT ANGIOGRAPHY HEAD AND NECK WITH CONTRAST TECHNIQUE: Multidetector CT imaging of the head and neck was performed using the standard protocol during bolus administration of intravenous contrast. Multiplanar CT image reconstructions and MIPs were obtained to evaluate the vascular anatomy. Carotid stenosis measurements (when applicable) are obtained utilizing NASCET criteria, using the distal internal carotid diameter as the denominator. RADIATION DOSE REDUCTION: This exam was performed according to the departmental dose-optimization program which includes automated exposure control, adjustment of the mA and/or kV according to patient size and/or use of iterative reconstruction technique. CONTRAST:  60mL OMNIPAQUE  IOHEXOL  350 MG/ML SOLN COMPARISON:  CT of the head and MRI of the head dated March 14, 2024. FINDINGS: CTA NECK FINDINGS Aortic arch: Mild calcific plaque. Right carotid system: Mild calcific plaque present posteriorly within the proximal internal carotid artery. No stenosis. The common carotid and internal carotid arteries are unremarkable otherwise. Left carotid system: The common carotid artery is normal in caliber and unremarkable. There is mild to moderate calcific plaque present proximally within the internal carotid artery, with less than 10% luminal stenosis. Normal caliber otherwise. Vertebral arteries: Normal in caliber and appearance throughout the respective courses. No stenosis. Skeleton: Mild to moderate degenerative changes within the cervical spine. Other neck: Negative. Upper chest: Clear lung apices. 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 There is mild stenosis of the distal M1 segment of the left middle cerebral artery. There is moderate focal stenosis of a sylvian M2 branch also present on image  106 of series 7. There is near occlusive stenosis of an M3 branch demonstrated on image 97. There is also near occlusive stenosis of the same branch on image 98. The anterior communicating arteries and their branches appear normal in caliber. Posterior circulation: Critical stenosis of the P1 and P2 segments of the left posterior cerebral artery is evident on image 110 of series 7. There is moderate stenosis of the P2 segment of the right posterior cerebral artery. The basilar artery is normal in caliber. The cerebellar arteries are patent. Venous sinuses: Patent. Anatomic variants: None. Review of  the MIP images confirms the above findings IMPRESSION: 1. Critical stenosis versus occlusion of the left P1 and P2 segments of the posterior cerebral artery. 2. 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 3. Moderate to severe stenoses of M2 and M3 branches of the left middle cerebral artery. 4. Mild calcific atheromatous disease within the cervical internal carotid arteries without significant stenosis. Electronically Signed   By: Evalene Coho M.D.   On: 03/14/2024 18:02   MR BRAIN WO CONTRAST Result Date: 03/14/2024 CLINICAL DATA:  Neuro deficit, acute, stroke suspected EXAM: MRI HEAD WITHOUT CONTRAST TECHNIQUE: Multiplanar, multiecho pulse sequences of the brain and surrounding structures were obtained without intravenous contrast. COMPARISON:  CT the head dated March 14, 2024. FINDINGS: Brain: There is restricted diffusion within the left middle frontal lobe compatible with acute nonhemorrhagic infarct. There are chronic encephalomalacia changes within the right occipital lobe and right medial parietal lobe. There is moderate periventricular white matter disease and mild subcortical white matter disease. Blooming artifact from likely prior hemorrhage is again noted medially within the left temporal lobe. There is no evidence of acute hemorrhage, mass or hydrocephalus. Vascular:  Normal vascular flow voids. Skull and upper cervical spine: Normal marrow signal. No osseous lesions. Sinuses/Orbits: Polypoid mucosal disease within the floor the right maxillary sinus. Status post bilateral lens replacement. Other: None. IMPRESSION: 1. Acute nonhemorrhagic infarct within the left mid frontal lobe. 2. Chronic encephalomalacia changes within the right occipital and right medial parietal lobes. Moderate periventricular white matter disease. Electronically Signed   By: Evalene Coho M.D.   On: 03/14/2024 16:43   CT Head Wo Contrast Result Date: 03/14/2024 CLINICAL DATA:  Neuro deficit, acute, stroke suspected EXAM: CT HEAD WITHOUT CONTRAST TECHNIQUE: Contiguous axial images were obtained from the base of the skull through the vertex without intravenous contrast. RADIATION DOSE REDUCTION: This exam was performed according to the departmental dose-optimization program which includes automated exposure control, adjustment of the mA and/or kV according to patient size and/or use of iterative reconstruction technique. COMPARISON:  CT the head dated November 15, 2022. FINDINGS: Brain: Age-related cerebral volume loss. Interval development of focal encephalomalacia changes within the right occipital lobe. Subtle encephalomalacia changes are also present in the right parietal lobe. Moderate diffuse cerebral white matter disease. No evidence of hemorrhage, mass, acute cortical infarct or hydrocephalus. Vascular: Mild calcific plaque within the carotid siphons. Skull: Intact and unremarkable. Sinuses/Orbits: Polypoid mucosal disease within the right maxillary sinus. The paranasal sinuses are otherwise clear. Status post bilateral lens replacement. Other: None. IMPRESSION: 1. Right occipital and parietal encephalomalacia and moderate diffuse cerebral white matter disease. No apparent acute process. Electronically Signed   By: Evalene Coho M.D.   On: 03/14/2024 15:10    EKG: I independently viewed the  EKG done and my findings are as followed: Normal sinus rhythm rate of 69.  Nonspecific ST-T changes QTc 417.  Assessment/Plan Present on Admission:  Acute CVA (cerebrovascular accident) [facility])  Principal Problem:   Acute CVA (cerebrovascular accident) (HCC)  Acute CVA involving left mid frontal lobe seen on noncontrast brain MRI, POA Ongoing stroke workup Follow 2D echo Frequent neurochecks PT/OT/speech therapy evaluation Permissive hypertension Treat SBP greater than 220 or DBP greater than 120 DAPT, high intensity statin Fall precautions Rest of management per neurology/stroke team  Cerebrovascular disease On CT angio head and neck: Critical stenosis versus occlusion of the left P1 and P2 segments of the posterior cerebral artery.  Severe stenosis of the  distal M1 segment of the right middle cerebral artery.  Moderate to severe stenosis of the M2 and M3 branches of the left middle cerebral artery. DAPT, high intensity statin Rest of management per neurology.  Hyperlipidemia Follow fasting lipid panel Goal LDL less than 70  Type 2 diabetes Follow A1c Goal A1c less than 7.0   Time: 75 minutes.   DVT prophylaxis: Subcu Lovenox  daily  Code Status: Full code  Family Communication: Spouse at bedside  Disposition Plan: Admitted to telemetry medical unit  Consults called: Neurology consulted by EDP.  Admission status: Observation status   Status is: Observation    Terry LOISE Hurst MD [facility] Pager [phone]  If 7PM-7AM, please contact night-coverage [email or web address] Password [facility]  03/14/2024, 7:33 PM

## 2024-03-14 NOTE — ED Notes (Signed)
 Called floor to advise patient will be in route, Floor receptive.

## 2024-03-14 NOTE — ED Notes (Signed)
 Pt to CT

## 2024-03-15 ENCOUNTER — Observation Stay (HOSPITAL_BASED_OUTPATIENT_CLINIC_OR_DEPARTMENT_OTHER): Payer: MEDICARE

## 2024-03-15 DIAGNOSIS — I69354 Hemiplegia and hemiparesis following cerebral infarction affecting left non-dominant side: Secondary | ICD-10-CM | POA: Diagnosis not present

## 2024-03-15 DIAGNOSIS — I639 Cerebral infarction, unspecified: Secondary | ICD-10-CM | POA: Diagnosis not present

## 2024-03-15 DIAGNOSIS — F0390 Unspecified dementia without behavioral disturbance: Secondary | ICD-10-CM

## 2024-03-15 DIAGNOSIS — I6389 Other cerebral infarction: Secondary | ICD-10-CM | POA: Diagnosis not present

## 2024-03-15 LAB — LIPID PANEL
Cholesterol: 95 mg/dL (ref 0–200)
HDL: 56 mg/dL (ref >40)
LDL Cholesterol: 13 mg/dL (ref 0–99)
Total CHOL/HDL Ratio: 1.7 ratio
Triglycerides: 132 mg/dL (ref <150)
VLDL: 26 mg/dL (ref 0–40)

## 2024-03-15 LAB — BASIC METABOLIC PANEL WITH GFR
Anion gap: 10 (ref 5–15)
BUN: 22 mg/dL (ref 8–23)
CO2: 21 mmol/L — ABNORMAL LOW (ref 22–32)
Calcium: 9.1 mg/dL (ref 8.9–10.3)
Chloride: 108 mmol/L (ref 98–111)
Creatinine, Ser: 1.29 mg/dL — ABNORMAL HIGH (ref 0.44–1.00)
GFR, Estimated: 42 mL/min — ABNORMAL LOW (ref >60)
Glucose, Bld: 115 mg/dL — ABNORMAL HIGH (ref 70–99)
Potassium: 3.9 mmol/L (ref 3.5–5.1)
Sodium: 139 mmol/L (ref 135–145)

## 2024-03-15 LAB — ECHOCARDIOGRAM COMPLETE
AR max vel: 2.12 cm2
AV Area VTI: 2.3 cm2
AV Area mean vel: 2.33 cm2
AV Mean grad: 5 mmHg
AV Peak grad: 10 mmHg
Ao pk vel: 1.58 m/s
Area-P 1/2: 3.03 cm2
Height: 68 in
S' Lateral: 2.8 cm
Weight: 2786.61 [oz_av]

## 2024-03-15 LAB — CBC
HCT: 36.7 % (ref 36.0–46.0)
Hemoglobin: 12.1 g/dL (ref 12.0–15.0)
MCH: 30.6 pg (ref 26.0–34.0)
MCHC: 33 g/dL (ref 30.0–36.0)
MCV: 92.7 fL (ref 80.0–100.0)
Platelets: 222 K/uL (ref 150–400)
RBC: 3.96 MIL/uL (ref 3.87–5.11)
RDW: 12.9 % (ref 11.5–15.5)
WBC: 7.5 K/uL (ref 4.0–10.5)
nRBC: 0 % (ref 0.0–0.2)

## 2024-03-15 LAB — MAGNESIUM: Magnesium: 1.4 mg/dL — ABNORMAL LOW (ref 1.7–2.4)

## 2024-03-15 LAB — PHOSPHORUS: Phosphorus: 3.3 mg/dL (ref 2.5–4.6)

## 2024-03-15 MED ORDER — CYCLOSPORINE 0.05 % OP EMUL
1.0000 [drp] | Freq: Two times a day (BID) | OPHTHALMIC | Status: DC
  Filled 2024-03-15 (×2): qty 30

## 2024-03-15 MED ORDER — DESONIDE 0.05 % EX CREA: TOPICAL_CREAM | Freq: Two times a day (BID) | CUTANEOUS | Status: DC | PRN

## 2024-03-15 MED ORDER — HYDROCORTISONE 1 % EX CREA: TOPICAL_CREAM | Freq: Two times a day (BID) | CUTANEOUS | Status: DC | PRN

## 2024-03-15 MED ORDER — CLOPIDOGREL BISULFATE 75 MG PO TABS: 75.0000 mg | ORAL_TABLET | Freq: Every day | ORAL | 1 refills | Status: DC

## 2024-03-15 MED ORDER — DONEPEZIL HCL 10 MG PO TABS: 5.0000 mg | ORAL_TABLET | Freq: Every day | ORAL | Status: DC

## 2024-03-15 MED ORDER — VITAMIN D 25 MCG (1000 UNIT) PO TABS
2000.0000 [IU] | ORAL_TABLET | Freq: Every day | ORAL | Status: DC
  Administered 2024-03-15: 2000 [IU] via ORAL
  Filled 2024-03-15: qty 2

## 2024-03-15 MED ORDER — ASPIRIN 81 MG PO TBEC: 81.0000 mg | DELAYED_RELEASE_TABLET | Freq: Every day | ORAL | 2 refills | Status: AC

## 2024-03-15 NOTE — Plan of Care (Signed)

## 2024-03-15 NOTE — Evaluation (Signed)
 Occupational Therapy Evaluation Patient Details Name: Beth Macdonald MRN: [id number] DOB: 1946/04/01 Today's Date: 03/15/2024   History of Present Illness   78 y.o. female presents to [facility] 03/14/24 with B LE weakness. MRI showed acute nonhemorrhagic infarct in L mid frontal lobe. CT angio head and neck showed critical stenosis vs. Occlusion of L P1 and P2 segments of PCA, severe stenosis in distal M1 segment of R MCA, mod/severe stenosis of M2 and M3 branches of L MCA. PMHx: Dementia, mitral valve insufficiency, CVA with L sided weakness, ITA, DM2, HTN, HLD, aortic atherosclerosis, fibromuscular hyperplasia of artery, diabetic neuropathy, history of DVT, osteoarthritis, right-sided facial nerve palsy     Clinical Impressions Patient admitted for the diagnosis above.  Patient has baseline dementia, and is a questionable historian.  Most likely she is close to her baseline, needing CGA to supervision with cues for sequencing and safety with in room mobility/toileting and Min A for ADL completion.  OT will continue efforts in the acute setting to address deficits, ensure baseline function, but no post acute OT is anticipated.       If plan is discharge home, recommend the following:   Assist for transportation;Direct supervision/assist for medications management;Supervision due to cognitive status;A little help with walking and/or transfers;A little help with bathing/dressing/bathroom;Help with stairs or ramp for entrance     Functional Status Assessment   Patient has had a recent decline in their functional status and demonstrates the ability to make significant improvements in function in a reasonable and predictable amount of time.     Equipment Recommendations   None recommended by OT     Recommendations for Other Services         Precautions/Restrictions   Precautions Precautions: Fall Recall of Precautions/Restrictions: Impaired Precaution/Restrictions Comments:  baseline dementia Restrictions Weight Bearing Restrictions Per Provider Order: No     Mobility Bed Mobility               General bed mobility comments: Patient up and on the toilet with NT    Transfers Overall transfer level: Needs assistance Equipment used: Rolling walker (2 wheels) Transfers: Sit to/from Stand, Bed to chair/wheelchair/BSC Sit to Stand: Contact guard assist     Step pivot transfers: Contact guard assist, Min assist            Balance Overall balance assessment: Needs assistance Sitting-balance support: Feet supported Sitting balance-Leahy Scale: Fair     Standing balance support: Reliant on assistive device for balance Standing balance-Leahy Scale: Fair                             ADL either performed or assessed with clinical judgement   ADL       Grooming: Contact guard assist;Cueing for safety;Cueing for sequencing               Lower Body Dressing: Minimal assistance;Cueing for compensatory techniques;Cueing for sequencing   Toilet Transfer: Regular Toilet;Rolling walker (2 wheels);Ambulation;Contact guard assist;Cueing for sequencing;Cueing for safety                   Vision   Vision Assessment?: No apparent visual deficits     Perception Perception: Within Functional Limits       Praxis Praxis: WFL       Pertinent Vitals/Pain Pain Assessment Pain Assessment: No/denies pain     Extremity/Trunk Assessment Upper Extremity Assessment Upper Extremity Assessment: Overall WFL for tasks assessed  Lower Extremity Assessment Lower Extremity Assessment: Defer to PT evaluation   Cervical / Trunk Assessment Cervical / Trunk Assessment: Normal   Communication Communication Communication: No apparent difficulties   Cognition Arousal: Alert Behavior During Therapy: WFL for tasks assessed/performed Cognition: Cognition impaired, History of cognitive impairments   Orientation impairments: Person    Memory impairment (select all impairments): Short-term memory, Working memory Attention impairment (select first level of impairment): Sustained attention Executive functioning impairment (select all impairments): Initiation, Sequencing, Reasoning, Problem solving                   Following commands: Impaired Following commands impaired: Follows one step commands with increased time, Follows one step commands inconsistently     Cueing  General Comments   Cueing Techniques: Verbal cues   VSS on RA   Exercises     Shoulder Instructions      Home Living Family/patient expects to be discharged to:: Private residence Living Arrangements: Spouse/significant other Available Help at Discharge: Family;Available 24 hours/day Type of Home: House Home Access: Level entry     Home Layout: One level     Bathroom Shower/Tub: Producer, television/film/video: Standard Bathroom Accessibility: Yes   Home Equipment: Cane - single Librarian, academic (2 wheels)          Prior Functioning/Environment Prior Level of Function : Patient poor historian/Family not available             Mobility Comments: Patient states she does not use any AD for mobility. ADLs Comments: Patient stating Ind with ADL,iADL, medications, states she continues to drive.  Would need to verify this with spouse.    OT Problem List: Decreased cognition;Decreased safety awareness;Impaired balance (sitting and/or standing)   OT Treatment/Interventions: Self-care/ADL training;Therapeutic activities;Patient/family education;DME and/or AE instruction;Balance training      OT Goals(Current goals can be found in the care plan section)   Acute Rehab OT Goals Patient Stated Goal: Return home OT Goal Formulation: With patient Time For Goal Achievement: 03/30/24 Potential to Achieve Goals: Good ADL Goals Pt Will Perform Grooming: with supervision;standing Pt Will Perform Lower Body Dressing: with  supervision;sit to/from stand Pt Will Transfer to Toilet: with supervision;ambulating;regular height toilet   OT Frequency:  Min 2X/week    Co-evaluation              AM-PAC OT 6 Clicks Daily Activity     Outcome Measure Help from another person eating meals?: None Help from another person taking care of personal grooming?: A Little Help from another person toileting, which includes using toliet, bedpan, or urinal?: A Little Help from another person bathing (including washing, rinsing, drying)?: A Little Help from another person to put on and taking off regular upper body clothing?: A Little Help from another person to put on and taking off regular lower body clothing?: A Little 6 Click Score: 19   End of Session Equipment Utilized During Treatment: Gait belt;Rolling walker (2 wheels) Nurse Communication: Mobility status  Activity Tolerance: Patient tolerated treatment well Patient left: in chair;with call bell/phone within reach;with chair alarm set  OT Visit Diagnosis: Unsteadiness on feet (R26.81);Other symptoms and signs involving cognitive function                Time: 1112-1130 OT Time Calculation (min): 18 min Charges:  OT General Charges $OT Visit: 1 Visit OT Evaluation $OT Eval Moderate Complexity: 1 Mod  03/15/2024  RP, OTR/L  Acute Rehabilitation Services  Office:  [phone]  Evett Kassa D Pansie Guggisberg 03/15/2024, 12:15 PM

## 2024-03-15 NOTE — Consult Note (Signed)
 NEUROLOGY CONSULT NOTE   Date of service: March 15, 2024 Patient Name: Beth Macdonald MRN:  [id number] DOB:  [birth year] Chief Complaint: BLE weakness Requesting Provider: Christobal Guadalajara, MD  History of Present Illness  Beth Macdonald is a 78 y.o. female with a PMHx of aortic atherosclerosis, dementia, DM2, diabetic neuropathy, DVT, Bell's palsy, fibromuscular hyperplasia of artery, HLD, mitral valve insufficiency, osteoarthritis, SCCA of skin (nose and forehead), skull fx at at 12, stroke over one year ago with residual left sided weakness, balance difficulties and TIA who presented to the ED on Saturday afternoon for BLE weakness. She [place] farmers market and started feeling weak in her legs and off balance. Bystanders took her home and she said she felt better but then she called 911 from home and said she was weak again. She has a baseline facial droop. LKW 1125 on Saturday morning. BP with EMS was 134/72 laying down, 84/40 sitting up. On further history-taking by Dr. Jerrie, it was revealed that the patient may have been having fluctuating right leg weakness - history taking is compromised by the patient's dementia - and recommended an MRI. SABRA   MRI revealed an acute nonhemorrhagic infarct within the left mid frontal lobe. Also noted were chronic encephalomalacia changes within the right occipital and right medial parietal lobes, in addition to moderate periventricular white matter disease.    ROS  Comprehensive ROS attempted in the context of the patient's dementia, which limits detail. She denies any limb numbness or weakness currently. Also denies vision changes and aphasia. Pertinent positives documented in HPI    Past History   Past Medical History:  Diagnosis Date   Aortic atherosclerosis (HCC)    Diabetes mellitus without complication (HCC)    Type 2   Diabetic neuropathy (HCC)    DVT of proximal lower limb (HCC)    Facial paralysis/Bells palsy    Fibromuscular hyperplasia of  artery (HCC)    Hyperlipidemia    Mitral valve insufficiency    Osteoarthritis    SCCA (squamous cell carcinoma) of skin 03/22/2021   Bridge of nose (in situ)   Skull fracture (HCC)    age 37   Squamous cell carcinoma of skin 03/22/2021   Right forehead (in situ)   TIA (transient ischemic attack) 2020   Varicose vein of leg    Past Surgical History:  Procedure Laterality Date   ELBOW SURGERY Bilateral    20 yrs ago   ENDOVENOUS ABLATION SAPHENOUS VEIN W/ LASER Bilateral    ENDOVENOUS ABLATION SAPHENOUS VEIN W/ LASER Left 11/19/2019   endovenous laser ablation anterior accessory branch of left GSV and stab phlebectomy> 20 incisions left leg by Medford Blade MD    shoulder Bilateral    [phone]   TOENAIL EXCISION Right 2019   Big toe   TONSILLECTOMY Bilateral    TOTAL KNEE ARTHROPLASTY Right 01/01/2022   Procedure: TOTAL KNEE ARTHROPLASTY;  Surgeon: Melodi Lerner, MD;  Location: [facility];  Service: Orthopedics;  Laterality: Right;    Family History: Family History  Problem Relation Age of Onset   Stroke Mother    Hypertension Father    Dementia Father    Leukemia Brother    Stroke Brother    Colon cancer Neg Hx    Esophageal cancer Neg Hx    Pancreatic cancer Neg Hx    Stomach cancer Neg Hx     Social History  reports that she quit smoking about 45 years ago. Her smoking use included  cigarettes. She started smoking about 65 years ago. She has a 20 pack-year smoking history. She has never used smokeless tobacco. She reports current alcohol use. She reports that she does not use drugs.  Allergies  Allergen Reactions   Clindamycin/Lincomycin Other (See Comments)    Cannot tolerate mycins causes C-Diff   Erythromycin     Other Reaction(s): Not available  erythromycin   Milk-Related Compounds Diarrhea   Tetracyclines & Related Other (See Comments)    Feel really nervous    Medications   Current Facility-Administered Medications:     stroke: early stages of  recovery book, , Does not apply, Once, Hall, Carole N, DO   0.9 %  sodium chloride  infusion, , Intravenous, Continuous, Shona Terry SAILOR, DO, Last Rate: 50 mL/hr at 03/15/24 0407, Infusion Verify at 03/15/24 0407   acetaminophen  (TYLENOL ) tablet 650 mg, 650 mg, Oral, Q6H PRN, Hall, Carole N, DO   aspirin  EC tablet 81 mg, 81 mg, Oral, Daily, Hall, Carole N, DO   atorvastatin  (LIPITOR) tablet 40 mg, 40 mg, Oral, Daily, Hall, Carole N, DO   clopidogrel  (PLAVIX ) tablet 75 mg, 75 mg, Oral, Daily, Hall, Carole N, DO   enoxaparin  (LOVENOX ) injection 30 mg, 30 mg, Subcutaneous, Q24H, Hall, Carole N, DO, 30 mg at 03/14/24 2033   melatonin tablet 5 mg, 5 mg, Oral, QHS PRN, Shona, Carole N, DO   polyethylene glycol (MIRALAX  / GLYCOLAX ) packet 17 g, 17 g, Oral, Daily PRN, Hall, Carole N, DO   prochlorperazine  (COMPAZINE ) injection 5 mg, 5 mg, Intravenous, Q6H PRN, Hall, Carole N, DO  No current facility-administered medications on file prior to encounter.   Current Outpatient Medications on File Prior to Encounter  Medication Sig Dispense Refill   aspirin  EC 81 MG tablet Take 1 tablet (81 mg total) by mouth daily. Swallow whole. 30 tablet 12   atorvastatin  (LIPITOR) 20 MG tablet Take 1 tablet (20 mg total) by mouth daily. Hold until you are taking Paxlovid      Cholecalciferol  (VITAMIN D3) 50 MCG (2000 UT) capsule Take 2,000 Units by mouth daily.     cycloSPORINE  (RESTASIS ) 0.05 % ophthalmic emulsion Place 1 drop into both eyes 2 (two) times daily.     desonide  (DESOWEN ) 0.05 % cream Apply topically 2 (two) times daily as needed (Rash). 60 g 11   donepezil  (ARICEPT ) 5 MG tablet Take 5 mg by mouth daily.     ibuprofen  (ADVIL ) 800 MG tablet Take 1 tablet (800 mg total) by mouth every 6 (six) hours as needed for mild pain or moderate pain. 30 tablet 0   lisinopril  (ZESTRIL ) 10 MG tablet Take 10 mg by mouth daily.     metFORMIN  (GLUCOPHAGE ) 500 MG tablet Take 1,000 mg by mouth 2 (two) times daily with a meal.      Polyethyl Glycol-Propyl Glycol (SYSTANE FREE OP) Apply 1 drop to eye daily as needed (dryness/irritation).     REPATHA  SURECLICK 140 MG/ML SOAJ Inject 140 mg into the skin every 14 (fourteen) days. Sunday     Semaglutide ,0.25 or 0.5MG /DOS, 2 MG/3ML SOPN Inject 0.5 mg into the skin once a week. Saturdays     benzonatate  (TESSALON ) 100 MG capsule Take 1 capsule (100 mg total) by mouth 3 (three) times daily. (Patient not taking: Reported on 03/14/2024) 20 capsule 0   Clobetasol  Prop Emollient Base (CLOBETASOL  PROPIONATE E) 0.05 % emollient cream Apply 1 Application topically 2 (two) times daily. (Patient taking differently: Apply 1 Application topically 2 (two) times daily  as needed (for rash).) 60 g 10   ondansetron  (ZOFRAN ) 4 MG tablet Take 1 tablet (4 mg total) by mouth every 6 (six) hours as needed for nausea. (Patient not taking: Reported on 05-Apr-2024) 20 tablet 0      Vitals   Vitals:   04-05-24 2238 April 05, 2024 2239 03/15/24 0029 03/15/24 0348  BP:   (!) 154/52 (!) 143/60  Pulse:   68 66  Resp:   18 18  Temp:   97.8 F (36.6 C) 98.5 F (36.9 C)  TempSrc:   Oral Oral  SpO2:   100% 97%  Weight: 79 kg     Height:  5' 8 (1.727 m)      Body mass index is 26.48 kg/m.   Physical Exam   Constitutional: Appears well-developed and well-nourished.  Psych: Affect appropriate to situation.  Eyes: No scleral injection.  HENT: No OP obstruction.  Head: Normocephalic.  Respiratory: Effort normal, non-labored breathing.   Neurologic Examination   Mental Status: Alert, pleasant and cooperative. Speech fluent with intact naming and comprehension. Able to follow all commands without difficulty. Oriented to her current hospitalization, but thinks that she is in [place]. Gives the day as Saturday and the month as May. States that the year is 52.  Cranial Nerves: II: Temporal visual fields intact with no extinction to DSS. PERRL  III,IV, VI: No ptosis but right palpebral fissure is  narrower in SI dimension than on the left; also tends to squint right eye at times. EOMI with subtle hesitancy when tracking to the right.  V: Temp sensation equal bilaterally  VII: Right facial droop, lower quadrant VIII: Hearing intact to voice IX,X: No hoarseness XI: Symmetric shoulder shrug XII: Midline tongue extension Motor: BUE 5/5 proximally and distally BLE 5/5 proximally and distally  No pronator drift.  Sensory: Temp and light touch intact throughout, bilaterally. No extinction to DSS.  Deep Tendon Reflexes: 2+ and symmetric BUE. Absent right patellar (prior right knee operation). Left patellar 1+.  Cerebellar: No ataxia with FNF bilaterally  Gait: Deferred   Labs/Imaging/Neurodiagnostic studies   CBC:  Recent Labs  Lab 04-05-24 1239 04-05-24 2136  WBC 15.2* 10.3  HGB 13.8 12.0  HCT 41.4 35.4*  MCV 93.7 91.7  PLT 255 221   Basic Metabolic Panel:  Lab Results  Component Value Date   NA 139 04-05-24   K 4.2 04-05-24   CO2 25 04-05-24   GLUCOSE 110 (H) 04-05-24   BUN 26 (H) 2024/04/05   CREATININE 1.37 (H) 2024/04/05   CALCIUM  10.1 April 05, 2024   GFRNONAA 39 (L) 04/05/2024   GFRAA >60 10/30/2018   Lipid Panel:  Lab Results  Component Value Date   LDLCALC 68 06/05/2022   HgbA1c:  Lab Results  Component Value Date   HGBA1C 6.7 (H) 11/15/2022   Urine Drug Screen: No results found for: LABOPIA, COCAINSCRNUR, LABBENZ, AMPHETMU, THCU, LABBARB  Alcohol Level No results found for: [facility] INR  Lab Results  Component Value Date   INR 1.0 2024-04-05   APTT  Lab Results  Component Value Date   APTT 26 10/29/2018     ASSESSMENT  78 y.o. 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  She was at a farmers market and started feeling weak in her legs and off balance. Bystanders took her home and she said she felt better but then she called 911 from home and said she was weak again. She has a baseline facial droop. LKW 1125 on Saturday morning. BP with EMS was 134/72 laying down, 84/40 sitting up. On further history-taking by Dr. Jerrie, it was revealed that the patient may have been having fluctuating right leg weakness - history taking is compromised by the patient's dementia - and recommended an MRI, which revealed an acute nonhemorrhagic infarct within the left mid frontal lobe. Also noted were chronic encephalomalacia changes within the right occipital and right medial parietal lobes, in addition to moderate periventricular white matter disease. - Exam reveals poor orientation to time and place. Speech is fluent with intact naming and comprehension. No limb weakness or numbness is noted.  - MRI brain as documented above.  - CT head: Right occipital and parietal encephalomalacia and moderate diffuse cerebral white matter disease. No apparent acute process. - CTA of head and neck: Critical stenosis versus occlusion of the left P1 and P2 segments of the posterior cerebral artery. 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  Moderate to severe stenoses of M2 and M3 branches of the left middle cerebral artery. Mild calcific atheromatous disease within the cervical internal carotid arteries without significant stenosis. - EKG: Normal sinus rhythm; Normal ECG - Impression: Acute left frontal lobe cortically based infarction. DDx for underlying etiology includes cardioembolic versus in situ thrombosis. Although her orthostatics were positive with EMS, the morphology and location of the stroke is not consistent with a watershed mechanism.   RECOMMENDATIONS  - Agree with  adding Plavix  to her home ASA regimen. Continue DAPT indefinitely.  - Continue her statin.  - BP management with permissive HTN until noon Sunday.  - HgbA1c, fasting lipid panel - PT consult, OT consult, Speech consult - TTE - Cardiac telemetry - Risk factor modification - Frequent neuro checks - NPO until passes stroke swallow screen   ______________________________________________________________________    Bonney SHARK, Dayn Barich, MD [facility]

## 2024-03-15 NOTE — Hospital Course (Addendum)
 78 year old female with history of prior CVA, type 2 diabetes, hyperlipidemia presents with right lower extremity weakness. workup revealed acute CVA involving left mid frontal lobe and vascular disease.  Neurology has been consulted Underwent a stroke workup with CT head MRI brain CT angio head and neck showing stenosis versus occlusion see report, Echo done with EF 60-10% no RWMA RV systolic function normal mitral valve normal aortic valve tricuspid Seen by PT OT did well> recommending home health PT.  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  MRI brain CTA CT head and neck reviewed reviewed Follow-up A1c 2D unremarkable, A1c 6.7 at goal, LDL is 68 at goal. Continue PT OT speech eval-passed bedside swallow screen will order diet. Allow permissive hypertension till 220/120,cont frequent neurochecks, cont fall precautions PTA on aspirin  81, Lipitor 20 ; Plan is to cont Plavix , aspirin  x 3 months then plavix  alone, continue statin.  Post stroke dysphagia: Speech consulted.  Dementia: Patient is alert awake pleasant.  Continue Aricept   Hyperlipidemia Goal less than 70, LDL at goal 13, PTA on Lipitor 20, Repatha  last done 03/07/24. Cont same  HTN: On discharge resume lisinopril   Type 2 diabetes PTA on metformin , semaglutide  goal A1c less than 7.0, follow A1c continue sliding scale insulin  Recent Labs  Lab 03/14/24 1328  GLUCAP 100*    AKI on CKD 3a: Last creatinine 1.0 in 3//24, on admission 1.6, monitor closely

## 2024-03-15 NOTE — Evaluation (Signed)
 Physical Therapy Evaluation Patient Details Name: Beth Macdonald MRN: [id number] DOB: March 14, 1946 Today's Date: 03/15/2024  History of Present Illness  78 y.o. female presents to [facility] 03/14/24 with B LE weakness. MRI showed acute nonhemorrhagic infarct in L mid frontal lobe. CT angio head and neck showed critical stenosis vs. Occlusion of L P1 and P2 segments of PCA, severe stenosis in distal M1 segment of R MCA, mod/severe stenosis of M2 and M3 branches of L MCA. PMHx: Dementia, mitral valve insufficiency, CVA with L sided weakness, ITA, DM2, HTN, HLD, aortic atherosclerosis, fibromuscular hyperplasia of artery, diabetic neuropathy, history of DVT, osteoarthritis, right-sided facial nerve palsy   Clinical Impression  Pt in bathroom with NT upon arrival and agreeable to PT eval. Pt is a questionable historian with pt reporting being independent for mobility with either no AD or SP cane. In today's session, pt required CGA for all mobility with ability to ambulate 120 ft with RW. Pt reports having 24/7 assist available from spouse upon d/c home. Recommending HHPT for home safety evaluation and to work towards independence with mobility. Intended progression to OP PT. Pt would benefit from acute skilled PT with current functional limitations listed below (see PT Problem List). Acute PT to follow.         If plan is discharge home, recommend the following: A lot of help with walking and/or transfers;A lot of help with bathing/dressing/bathroom;Assistance with cooking/housework;Direct supervision/assist for medications management;Direct supervision/assist for financial management;Assist for transportation;Help with stairs or ramp for entrance   Can travel by private vehicle    Yes    Equipment Recommendations None recommended by PT     Functional Status Assessment Patient has had a recent decline in their functional status and demonstrates the ability to make significant improvements in function in  a reasonable and predictable amount of time.     Precautions / Restrictions Precautions Precautions: Fall Recall of Precautions/Restrictions: Impaired Precaution/Restrictions Comments: baseline dementia Restrictions Weight Bearing Restrictions Per Provider Order: No      Mobility  Bed Mobility    General bed mobility comments: in bathroom with NT    Transfers Overall transfer level: Needs assistance Equipment used: Rolling walker (2 wheels) Transfers: Sit to/from Stand, Bed to chair/wheelchair/BSC Sit to Stand: Contact guard assist   Step pivot transfers: Contact guard assist, Min assist    General transfer comment: CGA for safety    Ambulation/Gait Ambulation/Gait assistance: Contact guard assist Gait Distance (Feet): 120 Feet Assistive device: Rolling walker (2 wheels) Gait Pattern/deviations: Step-through pattern, Decreased stride length, Drifts right/left Gait velocity: decr    General Gait Details: drifts right/left with cues for RW proximity. Attempted to take steps with no UE support, however, pt reaching for bed rails     Modified Rankin (Stroke Patients Only) Modified Rankin (Stroke Patients Only) Pre-Morbid Rankin Score: No symptoms (per pt report) Modified Rankin: Moderately severe disability     Balance Overall balance assessment: Needs assistance Sitting-balance support: Feet supported Sitting balance-Leahy Scale: Fair     Standing balance support: Reliant on assistive device for balance Standing balance-Leahy Scale: Fair Standing balance comment: able to stand statically with no AD, RW for gait          Pertinent Vitals/Pain Pain Assessment Pain Assessment: No/denies pain    Home Living Family/patient expects to be discharged to:: Private residence Living Arrangements: Spouse/significant other Available Help at Discharge: Family;Available 24 hours/day Type of Home: House Home Access: Level entry    Home Layout: One level Home  Equipment: Cane - single Librarian, academic (2 wheels);Shower seat;Grab bars - tub/shower      Prior Function Prior Level of Function : Patient poor historian/Family not available      Mobility Comments: Patient states she does not use any AD for mobility. ADLs Comments: Patient stating Ind with ADL,iADL, medications, states she continues to drive.  Would need to verify this with spouse.     Extremity/Trunk Assessment   Upper Extremity Assessment Upper Extremity Assessment: Defer to OT evaluation    Lower Extremity Assessment Lower Extremity Assessment: Generalized weakness    Cervical / Trunk Assessment Cervical / Trunk Assessment: Normal  Communication   Communication Communication: No apparent difficulties    Cognition Arousal: Alert Behavior During Therapy: WFL for tasks assessed/performed   PT - Cognitive impairments: History of cognitive impairments, Orientation, Memory, Sequencing, Problem solving, Safety/Judgement   Orientation impairments: Place, Time, Situation    Following commands: Impaired Following commands impaired: Follows one step commands with increased time, Follows one step commands inconsistently     Cueing Cueing Techniques: Verbal cues     General Comments General comments (skin integrity, edema, etc.): VSS on RA     PT Assessment Patient needs continued PT services  PT Problem List Decreased strength;Decreased activity tolerance;Decreased balance;Decreased mobility;Decreased cognition;Decreased knowledge of use of DME;Decreased safety awareness       PT Treatment Interventions DME instruction;Gait training;Stair training;Functional mobility training;Therapeutic activities;Therapeutic exercise;Balance training;Neuromuscular re-education;Patient/family education    PT Goals (Current goals can be found in the Care Plan section)  Acute Rehab PT Goals Patient Stated Goal: to get better PT Goal Formulation: With patient Time For Goal  Achievement: 03/29/24 Potential to Achieve Goals: Good    Frequency Min 2X/week        AM-PAC PT 6 Clicks Mobility  Outcome Measure Help needed turning from your back to your side while in a flat bed without using bedrails?: A Little Help needed moving from lying on your back to sitting on the side of a flat bed without using bedrails?: A Little Help needed moving to and from a bed to a chair (including a wheelchair)?: A Little Help needed standing up from a chair using your arms (e.g., wheelchair or bedside chair)?: A Little Help needed to walk in hospital room?: A Little Help needed climbing 3-5 steps with a railing? : A Lot 6 Click Score: 17    End of Session   Activity Tolerance: Patient tolerated treatment well Patient left: in chair;with call bell/phone within reach;with chair alarm set Nurse Communication: Mobility status PT Visit Diagnosis: Other abnormalities of gait and mobility (R26.89);Muscle weakness (generalized) (M62.81)    Time: 8886-8871 PT Time Calculation (min) (ACUTE ONLY): 15 min   Charges:   PT Evaluation $PT Eval Low Complexity: 1 Low   PT General Charges $$ ACUTE PT VISIT: 1 Visit        Kate ORN, PT, DPT Secure Chat Preferred  Rehab Office [phone]   Kate BRAVO Beth Macdonald 03/15/2024, 12:33 PM

## 2024-03-15 NOTE — Progress Notes (Signed)
 Echocardiogram 2D Echocardiogram has been performed.  Mitsuo Budnick N Radley Barto,RDCS 03/15/2024, 10:35 AM

## 2024-03-15 NOTE — Progress Notes (Addendum)
 STROKE TEAM PROGRESS NOTE   INTERIM HISTORY/SUBJECTIVE Patient presented with transient left leg weakness which appears to have improved.  MRI scan shows a subacute to acute left frontal cortical and subcortical infarct.  CT angiogram shows moderate left ICA, left PCA and bilateral MCA stenosis.  No family at the bedside.  Patient is sitting up in bed in no apparent distress. She is awake and alert.  Pleasantly confused stated she was 78 and Reagan was the president month she stated May or June.  Was only able to name 2 animals that will come for her legs  MRI brain with acute infarct in left frontal lobe. Recommend aspirin  and Plavix  for 3 months then Plavix  alone  OBJECTIVE  CBC    Component Value Date/Time   WBC 7.5 03/15/2024 0632   RBC 3.96 03/15/2024 0632   HGB 12.1 03/15/2024 0632   HCT 36.7 03/15/2024 0632   PLT 222 03/15/2024 0632   MCV 92.7 03/15/2024 0632   MCH 30.6 03/15/2024 0632   MCHC 33.0 03/15/2024 0632   RDW 12.9 03/15/2024 0632   LYMPHSABS 1.8 06/04/2022 1219   MONOABS 0.7 06/04/2022 1219   EOSABS 0.2 06/04/2022 1219   BASOSABS 0.1 06/04/2022 1219    BMET    Component Value Date/Time   NA 139 03/14/2024 1239   K 4.2 03/14/2024 1239   CL 100 03/14/2024 1239   CO2 25 03/14/2024 1239   GLUCOSE 110 (H) 03/14/2024 1239   BUN 26 (H) 03/14/2024 1239   CREATININE 1.37 (H) 03/14/2024 2136   CREATININE 1.04 12/25/2012 1609   CALCIUM  10.1 03/14/2024 1239   GFRNONAA 39 (L) 03/14/2024 2136    IMAGING past 24 hours CT ANGIO HEAD NECK W WO CM Result Date: 03/14/2024 CLINICAL DATA:  Stroke/TIA, determine embolic source EXAM: CT ANGIOGRAPHY HEAD AND NECK WITH CONTRAST TECHNIQUE: Multidetector CT imaging of the head and neck was performed using the standard protocol during bolus administration of intravenous contrast. Multiplanar CT image reconstructions and MIPs were obtained to evaluate the vascular anatomy. Carotid stenosis measurements (when applicable) are obtained  utilizing NASCET criteria, using the distal internal carotid diameter as the denominator. RADIATION DOSE REDUCTION: This exam was performed according to the departmental dose-optimization program which includes automated exposure control, adjustment of the mA and/or kV according to patient size and/or use of iterative reconstruction technique. CONTRAST:  60mL OMNIPAQUE  IOHEXOL  350 MG/ML SOLN COMPARISON:  CT of the head and MRI of the head dated March 14, 2024. FINDINGS: CTA NECK FINDINGS Aortic arch: Mild calcific plaque. Right carotid system: Mild calcific plaque present posteriorly within the proximal internal carotid artery. No stenosis. The common carotid and internal carotid arteries are unremarkable otherwise. Left carotid system: The common carotid artery is normal in caliber and unremarkable. There is mild to moderate calcific plaque present proximally within the internal carotid artery, with less than 10% luminal stenosis. Normal caliber otherwise. Vertebral arteries: Normal in caliber and appearance throughout the respective courses. No stenosis. Skeleton: Mild to moderate degenerative changes within the cervical spine. Other neck: Negative. Upper chest: Clear lung apices. 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 There is mild stenosis of the distal M1 segment of the left middle cerebral artery. There is moderate focal stenosis of a sylvian M2 branch also present on image 106 of series 7. There is near  occlusive stenosis of an M3 branch demonstrated on image 97. There is also near occlusive stenosis of the same branch on image 98. The anterior communicating arteries and their branches appear normal in caliber. Posterior circulation: Critical stenosis of the P1 and P2 segments of the left posterior cerebral artery is evident on image 110  of series 7. There is moderate stenosis of the P2 segment of the right posterior cerebral artery. The basilar artery is normal in caliber. The cerebellar arteries are patent. Venous sinuses: Patent. Anatomic variants: None. Review of the MIP images confirms the above findings IMPRESSION: 1. Critical stenosis versus occlusion of the left P1 and P2 segments of the posterior cerebral artery. 2. 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 3. Moderate to severe stenoses of M2 and M3 branches of the left middle cerebral artery. 4. Mild calcific atheromatous disease within the cervical internal carotid arteries without significant stenosis. Electronically Signed   By: Evalene Coho M.D.   On: 03/14/2024 18:02   MR BRAIN WO CONTRAST Result Date: 03/14/2024 CLINICAL DATA:  Neuro deficit, acute, stroke suspected EXAM: MRI HEAD WITHOUT CONTRAST TECHNIQUE: Multiplanar, multiecho pulse sequences of the brain and surrounding structures were obtained without intravenous contrast. COMPARISON:  CT the head dated March 14, 2024. FINDINGS: Brain: There is restricted diffusion within the left middle frontal lobe compatible with acute nonhemorrhagic infarct. There are chronic encephalomalacia changes within the right occipital lobe and right medial parietal lobe. There is moderate periventricular white matter disease and mild subcortical white matter disease. Blooming artifact from likely prior hemorrhage is again noted medially within the left temporal lobe. There is no evidence of acute hemorrhage, mass or hydrocephalus. Vascular: Normal vascular flow voids. Skull and upper cervical spine: Normal marrow signal. No osseous lesions. Sinuses/Orbits: Polypoid mucosal disease within the floor the right maxillary sinus. Status post bilateral lens replacement. Other: None. IMPRESSION: 1. Acute nonhemorrhagic infarct within the left mid frontal lobe. 2. Chronic encephalomalacia changes within the right  occipital and right medial parietal lobes. Moderate periventricular white matter disease. Electronically Signed   By: Evalene Coho M.D.   On: 03/14/2024 16:43   CT Head Wo Contrast Result Date: 03/14/2024 CLINICAL DATA:  Neuro deficit, acute, stroke suspected EXAM: CT HEAD WITHOUT CONTRAST TECHNIQUE: Contiguous axial images were obtained from the base of the skull through the vertex without intravenous contrast. RADIATION DOSE REDUCTION: This exam was performed according to the departmental dose-optimization program which includes automated exposure control, adjustment of the mA and/or kV according to patient size and/or use of iterative reconstruction technique. COMPARISON:  CT the head dated November 15, 2022. FINDINGS: Brain: Age-related cerebral volume loss. Interval development of focal encephalomalacia changes within the right occipital lobe. Subtle encephalomalacia changes are also present in the right parietal lobe. Moderate diffuse cerebral white matter disease. No evidence of hemorrhage, mass, acute cortical infarct or hydrocephalus. Vascular: Mild calcific plaque within the carotid siphons. Skull: Intact and unremarkable. Sinuses/Orbits: Polypoid mucosal disease within the right maxillary sinus. The paranasal sinuses are otherwise clear. Status post bilateral lens replacement. Other: None. IMPRESSION: 1. Right occipital and parietal encephalomalacia and moderate diffuse cerebral white matter disease. No apparent acute process. Electronically Signed   By: Evalene Coho M.D.   On: 03/14/2024 15:10    Vitals:   03/14/24 2239 03/15/24 0029 03/15/24 0348 03/15/24 0813  BP:  (!) 154/52 (!) 143/60 (!) 177/64  Pulse:  68 66 67  Resp:  18 18 18  Temp:  97.8 F (36.6 C) 98.5 F (36.9 C) (!) 97.5 F (36.4 C)  TempSrc:  Oral Oral Oral  SpO2:  100% 97% 99%  Weight:      Height: 5' 8 (1.727 m)        PHYSICAL EXAM General:  Alert, well-nourished, well-developed pleasant elderly Caucasian  lady in no acute distress Psych:  Mood and affect appropriate for situation CV: Regular rate and rhythm on monitor Respiratory:  Regular, unlabored respirations on room air GI: Abdomen soft and nontender   NEURO:  Mental Status: Awake alert, confused oriented to self.  Stated age as 81, president was [place], month as May or June.  She has no dysarthria or aphasia.  She was only able to name 2 animals that walk on 4 legs.  Clock drawing 3/4.  Cranial Nerves:  II: PERRL. Visual fields full.  III, IV, VI: EOMI. Eyelids elevate symmetrically.  V: Sensation is intact to light touch and symmetrical to face.  VII: Right facial droop VIII: hearing intact to voice. IX, X: Palate elevates symmetrically. Phonation is normal.  KP:Dynloizm shrug 5/5. XII: tongue is midline without fasciculations. Motor: 5/5 strength in left arm and leg, right arm 5/5, right leg 4/5 with distal weakness at the right ankle 3/5 Tone: is normal and bulk is normal Sensation- Intact to light touch bilaterally. Extinction absent to light touch to DSS.   Coordination: FTN intact bilaterally, HKS: no ataxia in BLE.No drift.  Gait- deferred   ASSESSMENT/PLAN  [name] is a 78 y.o. female with history of aortic atherosclerosis, dementia, DM2, diabetic neuropathy, DVT, Bell's palsy, fibromuscular hyperplasia of artery, HLD, mitral valve insufficiency, osteoarthritis, SCCA of skin (nose and forehead), skull fx at at 12, stroke over one year ago with residual left sided weakness, balance difficulties and TIA who presented to the ED on Saturday afternoon for BLE weakness.     Acute Ischemic Infarct:  left frontal  Etiology: Likely due to large vessel intracranial atherosclerosis CT head No acute abnormality.  Right occipital and parietal encephalomalacia and moderate diffuse cerebral white matter disease. CTA head & neck  Critical stenosis versus occlusion of the left P1 and P2 segments of the posterior cerebral  artery. 2. 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 3. Moderate to severe stenoses of M2 and M3 branches of the left middle cerebral artery. MRI acute left frontal infarct 2D Echo ordered LDL 68 HgbA1c 6.7 VTE prophylaxis -Lovenox  aspirin  81 mg daily prior to admission, now on aspirin  81 mg daily and clopidogrel  75 mg daily for 3 months and then Plavix  alone. Therapy recommendations:  Pending Disposition: Pending  Hx of Stroke/TIA Dementia TIA 10-2018  Right ACA territory infarct in 2023 On home Aricept   Hypertension Home meds: Lisinopril  10 mg Stable Blood Pressure Goal: SBP less than 160   Hyperlipidemia Home meds: Atorvastatin  20 mg Repatha ,  resumed in hospital LDL 68, goal < 70 Continue statin at discharge  Diabetes type II Controlled Home meds: Metformin , semaglutide   HgbA1c 6.7, goal < 7.0 CBGs SSI Recommend close follow-up with PCP for better DM control  Dysphagia Patient has post-stroke dysphagia, SLP consulted    Diet   Diet NPO time specified   Advance diet as tolerated  History of DVT -noted  History of Bell's palsy noted   other Active Problems Diabetic neuropathy  Hospital day # 0   Karna Geralds DNP, ACNPC-AG  [facility]  I have personally obtained  history,examined this patient, reviewed notes, independently viewed imaging studies, participated in medical decision making and plan of care.ROS completed by me personally and pertinent positives fully documented  I have made any additions or clarifications directly to the above note. Agree with note above.  Patient with known previous stroke and intracranial atherosclerosis presents with transient left leg weakness which appears to have resolved.  MRI scan shows a subacute left frontal infarct as well as CT angiogram showing diffuse intracranial atherosclerosis.  Recommend dual antiplatelet therapy aspirin  and Plavix  for 3 months followed by Plavix  alone  and aggressive risk factor modification.  Discussed with patient and Dr. Mennie.  I personally spent a total of 50 minutes in the care of the patient today including getting/reviewing separately obtained history, performing a medically appropriate exam/evaluation, counseling and educating, placing orders, referring and communicating with other health care professionals, documenting clinical information in the EHR, independently interpreting results, and coordinating care.         Eather Popp, MD Medical Director [facility] Pager: [phone] 03/15/2024 3:34 PM    To contact Stroke Continuity provider, please refer to [email or web address]. After hours, contact General Neurology

## 2024-03-15 NOTE — Discharge Summary (Signed)
 Physician Discharge Summary  Beth Macdonald FMW:[id number] DOB: [birth year] DOA: 03/14/2024  PCP: Clarice Nottingham, MD  Admit date: 03/14/2024 Discharge date: 03/15/2024 Recommendations for Outpatient Follow-up:  Follow up with PCP in 1 weeks-call for appointment Please obtain BMP/CBC in one week Follow-up with neurology  Discharge Dispo: Home with [facility] Discharge Condition: Stable Code Status:   Code Status: Full Code Diet recommendation:  Diet Order             Diet Carb Modified Fluid consistency: Thin; Room service appropriate? Yes  Diet effective now                    Brief/Interim Summary: 78 year old female with history of prior CVA, type 2 diabetes, hyperlipidemia presents with right lower extremity weakness. workup revealed acute CVA involving left mid frontal lobe and vascular disease.  Neurology has been consulted Underwent a stroke workup with CT head MRI brain CT angio head and neck showing stenosis versus occlusion see report, Echo done with EF 60-10% no RWMA RV systolic function normal mitral valve normal aortic valve tricuspid Seen by PT OT did well> recommending home health PT.  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  MRI brain CTA CT head and neck reviewed reviewed Follow-up A1c 2D unremarkable, A1c 6.7 at goal, LDL is 68 at goal. Continue PT OT speech eval-passed bedside swallow screen will order diet. Allow permissive hypertension till 220/120,cont frequent neurochecks, cont fall precautions PTA on aspirin  81, Lipitor 20 ; Plan is to cont Plavix , aspirin   x 3 months then plavix  alone, continue statin.  Post stroke dysphagia: Speech consulted.  Dementia: Patient is alert awake pleasant.  Continue Aricept   Hyperlipidemia Goal less than 70, LDL at goal 13, PTA on Lipitor 20, Repatha  last done 03/07/24. Cont same  HTN: On discharge resume lisinopril   Type 2 diabetes PTA on metformin , semaglutide  goal A1c less than 7.0, follow A1c continue sliding scale insulin  Recent Labs  Lab 03/14/24 1328  GLUCAP 100*    AKI on CKD 3a: Last creatinine 1.0 in 3//24, on admission 1.6, monitor closely    Discharge Exam: Vitals:   03/15/24 1151 03/15/24 1534  BP: (!) 175/61 (!) 161/66  Pulse: 75 79  Resp: 19 17  Temp: 98.2 F (36.8 C) 98.5 F (36.9 C)  SpO2: 97% 100%   General: Pt is alert, awake, not in acute distress Cardiovascular: RRR, S1/S2 +, no rubs, no gallops Respiratory: CTA bilaterally, no wheezing, no rhonchi Abdominal: Soft, NT, ND, bowel sounds + Extremities: no edema, no cyanosis  Discharge Instructions  Discharge Instructions     Ambulatory referral to Neurology   Complete by: As directed    An appointment is requested in approximately: 2 weeks   Discharge instructions   Complete by: As directed    Please call call MD or return to ER for similar or worsening recurring problem that brought you to hospital or if any fever,nausea/vomiting,abdominal pain, uncontrolled pain, chest pain,  shortness of breath or any other alarming symptoms.  Please follow-up your doctor as instructed in a week time and call the office for  appointment.  Please avoid alcohol, smoking, or any other illicit substance and maintain healthy habits including taking your regular medications as prescribed.  You were cared for by a hospitalist during your hospital stay. If you have any questions about your discharge medications or the care you received while you were in the hospital after you are discharged, you can call the unit and ask to speak with the  hospitalist on call if the hospitalist that took care of you is not available.  Once you are discharged, your primary care physician will handle any further medical issues. Please note that NO REFILLS for any discharge medications will be authorized once you are discharged, as it is imperative that you return to your primary care physician (or establish a relationship with a primary care physician if you do not have one) for your aftercare needs so that they can reassess your need for medications and monitor your lab values   Increase activity slowly   Complete by: As directed       Allergies as of 03/15/2024       Reactions   Clindamycin/lincomycin Other (See Comments)   Cannot tolerate mycins causes C-Diff   Erythromycin    Other Reaction(s): Not available erythromycin   Milk-related Compounds Diarrhea   Tetracyclines & Related Other (See Comments)   Feel really nervous        Medication List     STOP taking these medications    ibuprofen  800 MG tablet Commonly known as: ADVIL        TAKE these medications    aspirin  EC 81 MG tablet Take 1 tablet (81 mg total) by mouth daily. Swallow whole.   atorvastatin  20 MG tablet Commonly known as: LIPITOR Take 1 tablet (20 mg total) by mouth daily. Hold until you are taking Paxlovid    Clobetasol  Prop Emollient Base 0.05 % emollient cream Commonly known as: Clobetasol  Propionate E Apply 1 Application topically 2 (two) times daily. What changed:  when to take this reasons to take this   clopidogrel  75 MG tablet Commonly known as: PLAVIX  Take 1 tablet (75 mg total) by mouth daily. Start taking on: March 16, 2024   cycloSPORINE  0.05 % ophthalmic emulsion Commonly known as: RESTASIS  Place 1 drop into both eyes 2 (two) times daily.   desonide  0.05 % cream Commonly known as: DESOWEN  Apply topically 2 (two) times daily as needed (Rash).   donepezil  5 MG tablet Commonly known as: ARICEPT  Take 5 mg by mouth daily.    lisinopril  10 MG tablet Commonly known as: ZESTRIL  Take 10 mg by mouth daily.   metFORMIN  500 MG tablet Commonly known as: GLUCOPHAGE  Take 1,000 mg by mouth 2 (two) times daily with a meal.   Repatha  SureClick 140 MG/ML Soaj Generic drug: Evolocumab  Inject 140 mg into the skin every 14 (fourteen) days. Sunday   Semaglutide (0.25 or 0.5MG /DOS) 2 MG/3ML Sopn Inject 0.5 mg into the skin once a week. Saturdays   SYSTANE FREE OP Apply 1 drop to eye daily as needed (dryness/irritation).   Vitamin D3 50 MCG (2000 UT) capsule Take 2,000 Units by mouth daily.        Allergies  Allergen Reactions   Clindamycin/Lincomycin Other (See Comments)    Cannot tolerate mycins causes C-Diff   Erythromycin     Other Reaction(s): Not available  erythromycin   Milk-Related Compounds Diarrhea   Tetracyclines & Related Other (See Comments)    Feel really nervous    The results of significant diagnostics from  this hospitalization (including imaging, microbiology, ancillary and laboratory) are listed below for reference.    Microbiology: No results found for this or any previous visit (from the past 240 hours).  Procedures/Studies: ECHOCARDIOGRAM COMPLETE Result Date: 03/15/2024    ECHOCARDIOGRAM REPORT   Patient Name:   Lowana G Molinelli Date of Exam: 03/15/2024 Medical Rec #:  [id number]         Height:       68.0 in Accession #:    [id number]        Weight:       174.2 lb Date of Birth:  08/13/1946         BSA:          1.927 m Patient Age:    77 years          BP:           143/60 mmHg Patient Gender: F                 HR:           74 bpm. Exam Location:  Inpatient Procedure: 2D Echo, 3D Echo, Color Doppler and Cardiac Doppler (Both Spectral            and Color Flow Doppler were utilized during procedure). Indications:    Stroke  History:        Patient has prior history of Echocardiogram examinations, most                 recent 06/05/2022. DVT and TIA; Risk Factors:Diabetes,                  Dyslipidemia and Current Smoker.  Sonographer:    Logan Shove RDCS Referring Phys: [id number] CAROLE N HALL IMPRESSIONS  1. Left ventricular ejection fraction, by estimation, is 60 to 65%. Left ventricular ejection fraction by 3D volume is 61 %. The left ventricle has normal function. The left ventricle has no regional wall motion abnormalities. Left ventricular diastolic  parameters are consistent with Grade I diastolic dysfunction (impaired relaxation).  2. Right ventricular systolic function is normal. The right ventricular size is normal. There is mildly elevated pulmonary artery systolic pressure. The estimated right ventricular systolic pressure is 36.4 mmHg.  3. The mitral valve is grossly normal. Trivial mitral valve regurgitation. No evidence of mitral stenosis.  4. The aortic valve is tricuspid. There is mild calcification of the aortic valve. Aortic valve regurgitation is not visualized. No aortic stenosis is present.  5. The inferior vena cava is normal in size with greater than 50% respiratory variability, suggesting right atrial pressure of 3 mmHg. FINDINGS  Left Ventricle: Left ventricular ejection fraction, by estimation, is 60 to 65%. Left ventricular ejection fraction by 3D volume is 61 %. The left ventricle has normal function. The left ventricle has no regional wall motion abnormalities. The left ventricular internal cavity size was normal in size. There is borderline left ventricular hypertrophy. Left ventricular diastolic parameters are consistent with Grade I diastolic dysfunction (impaired relaxation). Right Ventricle: The right ventricular size is normal. No increase in right ventricular wall thickness. Right ventricular systolic function is normal. There is mildly elevated pulmonary artery systolic pressure. The tricuspid regurgitant velocity is 2.89  m/s, and with an assumed right atrial pressure of 3 mmHg, the estimated right ventricular systolic pressure is 36.4 mmHg. Left Atrium: Left atrial  size was normal in size. Right Atrium: Right atrial size was normal in size. Pericardium: There is no evidence of pericardial  effusion. Presence of epicardial fat layer. Mitral Valve: The mitral valve is grossly normal. Trivial mitral valve regurgitation. No evidence of mitral valve stenosis. Tricuspid Valve: The tricuspid valve is normal in structure. Tricuspid valve regurgitation is trivial. No evidence of tricuspid stenosis. Aortic Valve: The aortic valve is tricuspid. There is mild calcification of the aortic valve. Aortic valve regurgitation is not visualized. No aortic stenosis is present. Aortic valve mean gradient measures 5.0 mmHg. Aortic valve peak gradient measures 10.0 mmHg. Aortic valve area, by VTI measures 2.30 cm. Pulmonic Valve: The pulmonic valve was normal in structure. Pulmonic valve regurgitation is trivial. No evidence of pulmonic stenosis. Aorta: The aortic root is normal in size and structure. Venous: The inferior vena cava is normal in size with greater than 50% respiratory variability, suggesting right atrial pressure of 3 mmHg. IAS/Shunts: No atrial level shunt detected by color flow Doppler. Additional Comments: 3D was performed not requiring image post processing on an independent workstation and was normal.  LEFT VENTRICLE PLAX 2D LVIDd:         4.10 cm         Diastology LVIDs:         2.80 cm         LV e' medial:    3.92 cm/s LV PW:         1.00 cm         LV E/e' medial:  17.4 LV IVS:        0.90 cm         LV e' lateral:   6.85 cm/s LVOT diam:     1.90 cm         LV E/e' lateral: 10.0 LV SV:         85 LV SV Index:   44 LVOT Area:     2.84 cm        3D Volume EF                                LV 3D EF:    Left                                             ventricul                                             ar                                             ejection                                             fraction                                             by 3D  volume is                                             61 %.                                 3D Volume EF:                                3D EF:        61 %                                LV EDV:       128 ml                                LV ESV:       50 ml                                LV SV:        79 ml RIGHT VENTRICLE             IVC RV Basal diam:  3.10 cm     IVC diam: 2.40 cm RV S prime:     11.60 cm/s TAPSE (M-mode): 2.3 cm LEFT ATRIUM             Index        RIGHT ATRIUM           Index LA diam:        3.70 cm 1.92 cm/m   RA Area:     12.30 cm LA Vol (A2C):   88.9 ml 46.13 ml/m  RA Volume:   22.50 ml  11.68 ml/m LA Vol (A4C):   43.5 ml 22.57 ml/m LA Biplane Vol: 64.0 ml 33.21 ml/m  AORTIC VALVE AV Area (Vmax):    2.12 cm AV Area (Vmean):   2.33 cm AV Area (VTI):     2.30 cm AV Vmax:           158.00 cm/s AV Vmean:          107.000 cm/s AV VTI:            0.368 m AV Peak Grad:      10.0 mmHg AV Mean Grad:      5.0 mmHg LVOT Vmax:         118.00 cm/s LVOT Vmean:        87.900 cm/s LVOT VTI:          0.299 m LVOT/AV VTI ratio: 0.81  AORTA Ao Root diam: 2.80 cm Ao Asc diam:  3.30 cm MITRAL VALVE                TRICUSPID VALVE MV Area (PHT): 3.03 cm     TR Peak grad:   33.4 mmHg MV Decel Time: 250 msec     TR Vmax:        289.00 cm/s MV E velocity: 68.30 cm/s MV A velocity: 124.00 cm/s  SHUNTS MV E/A ratio:  0.55  Systemic VTI:  0.30 m                             Systemic Diam: 1.90 cm Soyla Merck MD Electronically signed by Soyla Merck MD Signature Date/Time: 03/15/2024/2:47:07 PM    Final    CT ANGIO HEAD NECK W WO CM Result Date: 03/14/2024 CLINICAL DATA:  Stroke/TIA, determine embolic source EXAM: CT ANGIOGRAPHY HEAD AND NECK WITH CONTRAST TECHNIQUE: Multidetector CT imaging of the head and neck was performed using the standard protocol during bolus administration of intravenous contrast. Multiplanar CT image reconstructions and MIPs were obtained to  evaluate the vascular anatomy. Carotid stenosis measurements (when applicable) are obtained utilizing NASCET criteria, using the distal internal carotid diameter as the denominator. RADIATION DOSE REDUCTION: This exam was performed according to the departmental dose-optimization program which includes automated exposure control, adjustment of the mA and/or kV according to patient size and/or use of iterative reconstruction technique. CONTRAST:  60mL OMNIPAQUE  IOHEXOL  350 MG/ML SOLN COMPARISON:  CT of the head and MRI of the head dated March 14, 2024. FINDINGS: CTA NECK FINDINGS Aortic arch: Mild calcific plaque. Right carotid system: Mild calcific plaque present posteriorly within the proximal internal carotid artery. No stenosis. The common carotid and internal carotid arteries are unremarkable otherwise. Left carotid system: The common carotid artery is normal in caliber and unremarkable. There is mild to moderate calcific plaque present proximally within the internal carotid artery, with less than 10% luminal stenosis. Normal caliber otherwise. Vertebral arteries: Normal in caliber and appearance throughout the respective courses. No stenosis. Skeleton: Mild to moderate degenerative changes within the cervical spine. Other neck: Negative. Upper chest: Clear lung apices. 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 There is mild stenosis of the distal M1 segment of the left middle cerebral artery. There is moderate focal stenosis of a sylvian M2 branch also present on image 106 of series 7. There is near occlusive stenosis of an M3 branch demonstrated on image 97. There is also near occlusive stenosis of the same branch on image 98. The anterior communicating arteries and their branches appear normal in caliber. Posterior circulation: Critical  stenosis of the P1 and P2 segments of the left posterior cerebral artery is evident on image 110 of series 7. There is moderate stenosis of the P2 segment of the right posterior cerebral artery. The basilar artery is normal in caliber. The cerebellar arteries are patent. Venous sinuses: Patent. Anatomic variants: None. Review of the MIP images confirms the above findings IMPRESSION: 1. Critical stenosis versus occlusion of the left P1 and P2 segments of the posterior cerebral artery. 2. 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 3. Moderate to severe stenoses of M2 and M3 branches of the left middle cerebral artery. 4. Mild calcific atheromatous disease within the cervical internal carotid arteries without significant stenosis. Electronically Signed   By: Evalene Coho M.D.   On: 03/14/2024 18:02   MR BRAIN WO CONTRAST Result Date: 03/14/2024 CLINICAL DATA:  Neuro deficit, acute, stroke suspected EXAM: MRI HEAD WITHOUT CONTRAST TECHNIQUE: Multiplanar, multiecho pulse sequences of the brain and surrounding structures were obtained without intravenous contrast. COMPARISON:  CT the head dated March 14, 2024. FINDINGS: Brain: There is restricted diffusion within the left middle  frontal lobe compatible with acute nonhemorrhagic infarct. There are chronic encephalomalacia changes within the right occipital lobe and right medial parietal lobe. There is moderate periventricular white matter disease and mild subcortical white matter disease. Blooming artifact from likely prior hemorrhage is again noted medially within the left temporal lobe. There is no evidence of acute hemorrhage, mass or hydrocephalus. Vascular: Normal vascular flow voids. Skull and upper cervical spine: Normal marrow signal. No osseous lesions. Sinuses/Orbits: Polypoid mucosal disease within the floor the right maxillary sinus. Status post bilateral lens replacement. Other: None. IMPRESSION: 1. Acute  nonhemorrhagic infarct within the left mid frontal lobe. 2. Chronic encephalomalacia changes within the right occipital and right medial parietal lobes. Moderate periventricular white matter disease. Electronically Signed   By: Evalene Coho M.D.   On: 03/14/2024 16:43   CT Head Wo Contrast Result Date: 03/14/2024 CLINICAL DATA:  Neuro deficit, acute, stroke suspected EXAM: CT HEAD WITHOUT CONTRAST TECHNIQUE: Contiguous axial images were obtained from the base of the skull through the vertex without intravenous contrast. RADIATION DOSE REDUCTION: This exam was performed according to the departmental dose-optimization program which includes automated exposure control, adjustment of the mA and/or kV according to patient size and/or use of iterative reconstruction technique. COMPARISON:  CT the head dated November 15, 2022. FINDINGS: Brain: Age-related cerebral volume loss. Interval development of focal encephalomalacia changes within the right occipital lobe. Subtle encephalomalacia changes are also present in the right parietal lobe. Moderate diffuse cerebral white matter disease. No evidence of hemorrhage, mass, acute cortical infarct or hydrocephalus. Vascular: Mild calcific plaque within the carotid siphons. Skull: Intact and unremarkable. Sinuses/Orbits: Polypoid mucosal disease within the right maxillary sinus. The paranasal sinuses are otherwise clear. Status post bilateral lens replacement. Other: None. IMPRESSION: 1. Right occipital and parietal encephalomalacia and moderate diffuse cerebral white matter disease. No apparent acute process. Electronically Signed   By: Evalene Coho M.D.   On: 03/14/2024 15:10   CUP PACEART REMOTE DEVICE CHECK Result Date: 03/09/2024 ILR summary report received. Battery status OK. Normal device function. No new symptom, tachy, brady, or pause episodes. No new AF episodes. Monthly summary reports and ROV/PRN. MC, CVRS   Labs: BNP (last 3 results) No results for  input(s): BNP in the last 8760 hours. Basic Metabolic Panel: Recent Labs  Lab 03/14/24 1239 03/14/24 2136 03/15/24 0632  NA 139  --  139  K 4.2  --  3.9  CL 100  --  108  CO2 25  --  21*  GLUCOSE 110*  --  115*  BUN 26*  --  22  CREATININE 1.65* 1.37* 1.29*  CALCIUM  10.1  --  9.1  MG  --   --  1.4*  PHOS  --   --  3.3   Liver Function Tests: Recent Labs  Lab 03/14/24 1239  AST 27  ALT 24  ALKPHOS 36*  BILITOT 1.0  PROT 7.4  ALBUMIN 3.8   No results for input(s): LIPASE, AMYLASE in the last 168 hours. No results for input(s): AMMONIA in the last 168 hours. CBC: Recent Labs  Lab 03/14/24 1239 03/14/24 2136 03/15/24 0632  WBC 15.2* 10.3 7.5  HGB 13.8 12.0 12.1  HCT 41.4 35.4* 36.7  MCV 93.7 91.7 92.7  PLT 255 221 222   CBG: Recent Labs  Lab 03/14/24 1328  GLUCAP 100*   Hgb A1c No results for input(s): HGBA1C in the last 72 hours. Anemia work up No results for input(s): VITAMINB12, FOLATE, FERRITIN, TIBC, IRON, RETICCTPCT  in the last 72 hours.  Cardiac Enzymes: No results for input(s): CKTOTAL, CKMB, CKMBINDEX, TROPONINI in the last 168 hours. BNP: Invalid input(s): POCBNP D-Dimer No results for input(s): DDIMER in the last 72 hours. Lipid Profile Recent Labs    03/15/24 0632  CHOL 95  HDL 56  LDLCALC 13  TRIG 132  CHOLHDL 1.7   Thyroid  function studies No results for input(s): TSH, T4TOTAL, T3FREE, THYROIDAB in the last 72 hours.  Invalid input(s): FREET3 Urinalysis    Component Value Date/Time   COLORURINE YELLOW 03/14/2024 1239   APPEARANCEUR HAZY (A) 03/14/2024 1239   LABSPEC 1.018 03/14/2024 1239   PHURINE 5.0 03/14/2024 1239   GLUCOSEU NEGATIVE 03/14/2024 1239   HGBUR NEGATIVE 03/14/2024 1239   BILIRUBINUR NEGATIVE 03/14/2024 1239   KETONESUR NEGATIVE 03/14/2024 1239   PROTEINUR NEGATIVE 03/14/2024 1239   NITRITE NEGATIVE 03/14/2024 1239   LEUKOCYTESUR TRACE (A) 03/14/2024 1239    Sepsis Labs Recent Labs  Lab 03/14/24 1239 03/14/24 2136 03/15/24 0632  WBC 15.2* 10.3 7.5   Microbiology No results found for this or any previous visit (from the past 240 hours).   Time coordinating discharge: 25 minutes  SIGNED: Mennie LAMY, MD  [facility] 03/15/2024, 3:38 PM  If 7PM-7AM, please contact night-coverage [email or web address]

## 2024-03-15 NOTE — TOC Transition Note (Signed)
 Transition of Care (TOC) - Discharge Note Rayfield Gobble RN, BSN Inpatient Care Management Unit 4E- RN Case Manager See Treatment Team for direct phone # [phone] weekend coverage  Patient Details  Name: Beth Macdonald MRN: [id number] Date of Birth: July 09, 1946  [facility] of [facility]) CM/SW Contact:  Gobble Rayfield Hurst, RN Phone Number: 03/15/2024, 3:52 PM   Clinical Narrative:    Pt stable for transition home today. Orders placed for HHPT/OT needs.   CM in to speak with pt and spouse at bedside. Per conversation  pt has had HH in past, they voiced that they do not have a preference on [facility] provider.  Spouse to transport home, pt has needed DME at home.   Address, phone # and PCP all confirmed- spouse asked that her # be the primary contact.   Call made to [facility]- referral has been accepted.   Pt and spouse updated and info placed on AVS for [facility] provider.    Final next level of care: Home w Home Health Services Barriers to Discharge: No Barriers Identified   Patient Goals and CMS Choice Patient states their goals for this hospitalization and ongoing recovery are:: return home CMS Medicare.gov Compare Post Acute Care list provided to:: Patient Choice offered to / list presented to : Patient, Spouse      Discharge Placement               Home w/ [facility]        Discharge Plan and Services Additional resources added to the After Visit Summary for     Discharge Planning Services: CM Consult Post Acute Care Choice: Home Health          DME Arranged: N/A DME Agency: NA       HH Arranged: PT, OT HH Agency: [facility] Date [facility] Agency Contacted: 03/15/24 Time HH Agency Contacted: [phone] Representative spoke with at [facility] Agency: Darleene  Social Drivers of Health (SDOH) Interventions SDOH Screenings   Food Insecurity: No Food Insecurity (03/14/2024)  Housing: Low Risk  (03/14/2024)  Transportation Needs: No Transportation Needs (03/14/2024)  Utilities: Not  At Risk (03/14/2024)  Depression (PHQ2-9): Low Risk  (12/15/2018)  Social Connections: Moderately Isolated (03/14/2024)  Tobacco Use: Medium Risk (03/14/2024)     Readmission Risk Interventions     No data to display

## 2024-03-15 NOTE — Care Management Obs Status (Signed)
 MEDICARE OBSERVATION STATUS NOTIFICATION   Patient Details  Name: Beth Macdonald MRN: [id number] Date of Birth: 05/11/1946   Medicare Observation Status Notification Given:  Yes    Charlann Rayfield Hurst, RN 03/15/2024, 3:41 PM

## 2024-03-15 NOTE — Progress Notes (Addendum)
 DISCHARGE NOTE HOME Beth Macdonald to be discharged Home per MD order. Discussed prescriptions and follow up appointments with the patient and room mate. Prescriptions given to patient; medication list explained in detail. Patient verbalized understanding.   Skin clean, dry and intact without evidence of skin break down, no evidence of skin tears noted. IV catheter discontinued intact. Site without signs and symptoms of complications. Dressing and pressure applied. Pt denies pain at the site currently. No complaints noted.  Patient free of lines, drains, and wounds.   An After Visit Summary (AVS) was printed and given to the patient. Patient escorted via wheelchair, and discharged home via private auto.  [name], RN

## 2024-03-16 DIAGNOSIS — I69354 Hemiplegia and hemiparesis following cerebral infarction affecting left non-dominant side: Secondary | ICD-10-CM | POA: Diagnosis not present

## 2024-03-16 DIAGNOSIS — I69398 Other sequelae of cerebral infarction: Secondary | ICD-10-CM | POA: Diagnosis not present

## 2024-03-16 LAB — HEMOGLOBIN A1C
Hgb A1c MFr Bld: 8.4 % — ABNORMAL HIGH (ref 4.8–5.6)
Mean Plasma Glucose: 194 mg/dL

## 2024-03-17 DIAGNOSIS — I69354 Hemiplegia and hemiparesis following cerebral infarction affecting left non-dominant side: Secondary | ICD-10-CM | POA: Diagnosis not present

## 2024-03-17 DIAGNOSIS — E782 Mixed hyperlipidemia: Secondary | ICD-10-CM | POA: Diagnosis not present

## 2024-03-17 DIAGNOSIS — E1169 Type 2 diabetes mellitus with other specified complication: Secondary | ICD-10-CM | POA: Diagnosis not present

## 2024-03-17 DIAGNOSIS — I10 Essential (primary) hypertension: Secondary | ICD-10-CM | POA: Diagnosis not present

## 2024-03-17 DIAGNOSIS — I69398 Other sequelae of cerebral infarction: Secondary | ICD-10-CM | POA: Diagnosis not present

## 2024-03-17 DIAGNOSIS — Z09 Encounter for follow-up examination after completed treatment for conditions other than malignant neoplasm: Secondary | ICD-10-CM | POA: Diagnosis not present

## 2024-03-17 DIAGNOSIS — E1165 Type 2 diabetes mellitus with hyperglycemia: Secondary | ICD-10-CM | POA: Diagnosis not present

## 2024-03-17 DIAGNOSIS — Z8673 Personal history of transient ischemic attack (TIA), and cerebral infarction without residual deficits: Secondary | ICD-10-CM | POA: Diagnosis not present

## 2024-03-19 ENCOUNTER — Ambulatory Visit (HOSPITAL_COMMUNITY)
Admission: EM | Admit: 2024-03-19 | Discharge: 2024-03-19 | Disposition: A | Discharge status: Home / Self Care | Payer: MEDICARE | Attending: Family Medicine | Admitting: Family Medicine

## 2024-03-19 ENCOUNTER — Encounter (HOSPITAL_COMMUNITY): Payer: Self-pay

## 2024-03-19 DIAGNOSIS — Z7901 Long term (current) use of anticoagulants: Secondary | ICD-10-CM

## 2024-03-19 DIAGNOSIS — T148XXA Other injury of unspecified body region, initial encounter: Secondary | ICD-10-CM

## 2024-03-19 NOTE — ED Triage Notes (Signed)
 Patient presenting with bruising around the IV site of her right forearm onset this past Saturday. Denies any pain or swelling. States the area is tender to the touch.  Prescriptions or OTC medications tried: No. Patient was started on Plavix  for the stroke.

## 2024-03-19 NOTE — ED Provider Notes (Signed)
 [facility]   [id number] 03/19/24 Arrival Time: 1531  ASSESSMENT & PLAN:  1. Bruising   2. Current use of anticoagulant therapy    Reassured this is from the Plavix . Minor bruising of bilateral forearms at recent IV site placement. Comfortable with home observation. No change in medications.   Follow-up Information     Schedule an appointment as soon as possible for a visit  with Clarice Nottingham, MD.   Specialty: Internal Medicine Why: For follow up. Contact information: [address] [place] [address] [phone]                 Reviewed expectations re: course of current medical issues. Questions answered. Outlined signs and symptoms indicating need for more acute intervention. Understanding verbalized. After Visit Summary given.   SUBJECTIVE: History from: Patient and Family. Beth Macdonald is a 78 y.o. female. Stroke approx one week ago; admitted. Patient presenting with bruising around the IV site of her right forearm onset this past Saturday. Denies any pain or swelling. States the area is tender to the touch.  Patient was started on Plavix  for the stroke.    OBJECTIVE:  Vitals:   03/19/24 1606  BP: (!) 145/80  Pulse: 77  Resp: 16  Temp: 98.2 F (36.8 C)  TempSrc: Oral  SpO2: 95%  Weight: 79 kg  Height: 5' 8 (1.727 m)    General appearance: alert; no distress Skin: warm and dry; bruising at IV sites on bilateral forearms Psychological: alert and cooperative; normal mood and affect  Labs:  Labs Reviewed - No data to display  Imaging: No results found.  Allergies  Allergen Reactions   Clindamycin/Lincomycin Other (See Comments)    Cannot tolerate mycins causes C-Diff   Erythromycin     Other Reaction(s): Not available  erythromycin   Milk-Related Compounds Diarrhea   Tetracyclines & Related Other (See Comments)    Feel really nervous    Past Medical History:  Diagnosis Date   Aortic  atherosclerosis (HCC)    Diabetes mellitus without complication (HCC)    Type 2   Diabetic neuropathy (HCC)    DVT of proximal lower limb (HCC)    Facial paralysis/Bells palsy    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HCC)    Hyperlipidemia    Mitral valve insufficiency    Osteoarthritis    SCCA (squamous cell carcinoma) of skin 03/22/2021   Bridge of nose (in situ)   Skull fracture (HCC)    age 32   Squamous cell carcinoma of skin 03/22/2021   Right forehead (in situ)   TIA (transient ischemic attack) 2020   Varicose vein of leg    Social History   Socioeconomic History   Marital status: Married    Spouse name: Not on file   Number of children: 0   Years of education: Not on file   Highest education level: Not on file  Occupational History   Not on file  Tobacco Use   Smoking status: Former    Current packs/day: 0.00    Average packs/day: 1 pack/day for 20.0 years (20.0 ttl pk-yrs)    Types: Cigarettes    Start date: 66    Quit date: 46    Years since quitting: 45.5   Smokeless tobacco: Never  Vaping Use   Vaping status: Never Used  Substance and Sexual Activity   Alcohol use: Yes    Comment: social/rarely   Drug use: Never   Sexual activity: Yes  Other  Topics Concern   Not on file  Social History Narrative   Not on file   Social Drivers of Health   Financial Resource Strain: Not on file  Food Insecurity: No Food Insecurity (03/14/2024)   Hunger Vital Sign    Worried About Running Out of Food in the Last Year: Never true    Ran Out of Food in the Last Year: Never true  Transportation Needs: No Transportation Needs (03/14/2024)   PRAPARE - Administrator, Civil Service (Medical): No    Lack of Transportation (Non-Medical): No  Physical Activity: Not on file  Stress: Not on file  Social Connections: Moderately Isolated (03/14/2024)   Social Connection and Isolation Panel    Frequency of Communication with Friends and Family: More than three  times a week    Frequency of Social Gatherings with Friends and Family: More than three times a week    Attends Religious Services: Never    Database administrator or Organizations: No    Attends Banker Meetings: Never    Marital Status: Married  Catering manager Violence: Not At Risk (03/14/2024)   Humiliation, Afraid, Rape, and Kick questionnaire    Fear of Current or Ex-Partner: No    Emotionally Abused: No    Physically Abused: No    Sexually Abused: No   Family History  Problem Relation Age of Onset   Stroke Mother    Hypertension Father    Dementia Father    Leukemia Brother    Stroke Brother    Colon cancer Neg Hx    Esophageal cancer Neg Hx    Pancreatic cancer Neg Hx    Stomach cancer Neg Hx    Past Surgical History:  Procedure Laterality Date   ELBOW SURGERY Bilateral    20 yrs ago   ENDOVENOUS ABLATION SAPHENOUS VEIN W/ LASER Bilateral    ENDOVENOUS ABLATION SAPHENOUS VEIN W/ LASER Left 11/19/2019   endovenous laser ablation anterior accessory branch of left GSV and stab phlebectomy> 20 incisions left leg by Medford Blade MD    shoulder Bilateral    [phone]   TOENAIL EXCISION Right 2019   Big toe   TONSILLECTOMY Bilateral    TOTAL KNEE ARTHROPLASTY Right 01/01/2022   Procedure: TOTAL KNEE ARTHROPLASTY;  Surgeon: Melodi Lerner, MD;  Location: [facility];  Service: Orthopedics;  Laterality: Right;     Rolinda Rogue, MD 03/19/24 1821

## 2024-03-20 DIAGNOSIS — I69354 Hemiplegia and hemiparesis following cerebral infarction affecting left non-dominant side: Secondary | ICD-10-CM | POA: Diagnosis not present

## 2024-03-20 DIAGNOSIS — I69398 Other sequelae of cerebral infarction: Secondary | ICD-10-CM | POA: Diagnosis not present

## 2024-03-24 ENCOUNTER — Encounter: Payer: Self-pay | Admitting: Neurology

## 2024-03-24 ENCOUNTER — Ambulatory Visit (INDEPENDENT_AMBULATORY_CARE_PROVIDER_SITE_OTHER): Payer: MEDICARE | Admitting: Neurology

## 2024-03-24 VITALS — BP 139/79 | HR 75 | Ht 71.0 in | Wt 171.0 lb

## 2024-03-24 DIAGNOSIS — I679 Cerebrovascular disease, unspecified: Secondary | ICD-10-CM

## 2024-03-24 DIAGNOSIS — F0150 Vascular dementia without behavioral disturbance: Secondary | ICD-10-CM

## 2024-03-24 DIAGNOSIS — G309 Alzheimer's disease, unspecified: Secondary | ICD-10-CM | POA: Diagnosis not present

## 2024-03-24 DIAGNOSIS — R29898 Other symptoms and signs involving the musculoskeletal system: Secondary | ICD-10-CM | POA: Diagnosis not present

## 2024-03-24 DIAGNOSIS — I63422 Cerebral infarction due to embolism of left anterior cerebral artery: Secondary | ICD-10-CM | POA: Diagnosis not present

## 2024-03-24 DIAGNOSIS — R413 Other amnesia: Secondary | ICD-10-CM | POA: Diagnosis not present

## 2024-03-24 DIAGNOSIS — F0280 Dementia in other diseases classified elsewhere without behavioral disturbance: Secondary | ICD-10-CM

## 2024-03-24 DIAGNOSIS — G3184 Mild cognitive impairment, so stated: Secondary | ICD-10-CM

## 2024-03-24 NOTE — Progress Notes (Signed)
 [facility] [address] [place]. [place] [address] [phone]       OFFICE FOLLOW-UP NOTE  [name] Date of Birth:  Jan 26, 1946 Medical Record Number:  [id number]   HPI: [name] is a pleasant 78 year old Caucasian lady seen today for initial office follow-up visit following hospital consultation for stroke in July 2025.  She is accompanied by her wife [name]. She has past medical history diabetes, diabetic neuropathy, DVT, Bell's palsy, fibromuscular hyperplasia of artery, aortic atherosclerosis, hyperlipidemia, mitral valve insufficiency, osteoarthritis, dementia, skull fracture prior stroke 1 year ago with some residual left-sided weakness.  The patient was with her wife at farmers market where all of a sudden she was unable to move her legs.  She was able to stand by holding onto a railing.  She stated she could not control the left leg.  EMS was called.  On arrival history was moderate as patient kept on saying fluctuating weakness in both legs and history taking was compromised by baseline dementia.  MRI scan revealed an acute nonhemorrhagic infarct involving the left mid frontal as well as left with changes of chronic encephalomalacia in involving right occipital and right medial parietal lobes as well as advanced changes of small vessel disease.  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  LDL cholesterol 68 mg percent.  Hemoglobin A1c of 6.7.  Echocardiogram showed ejection fraction 60 to 65% left atrial size is normal.  Patient started on dual antiplatelet therapy aspirin  and Plavix  for 3 months followed by Plavix  alone.  She was also started on Aricept  5 mg daily for dementia which she seems to be tolerating well so far.  Patient is currently at home.  She is doing home physical  occupational therapy.  She is able to ambulate well with a cane but she forgot a cane today and coming to the office.  Patient needs help with activities of daily living like dressing herself as well as shower.  She has poor short-term memory and is often disoriented at baseline.  She has not exhibited any delusions hallucination or behavioral abnormalities.  She has not exhibited unsafe behavior.  She has had a few falls but none recently.  She is tolerating aspirin  Plavix  well with minor bruising and no bleeding.  Blood pressure is well-controlled at home and today was 139/79.  She is tolerating Lipitor well without side effects.  There is no family history of dementia. ROS:   14 system review of systems is positive for memory loss, leg weakness, difficulty walking, forgetfulness, bruising all other systems negative  PMH:  Past Medical History:  Diagnosis Date   Aortic atherosclerosis (HCC)    Diabetes mellitus without complication (HCC)    Type 2   Diabetic neuropathy (HCC)    DVT of proximal lower limb (HCC)    Facial paralysis/Bells palsy    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HCC)    Hyperlipidemia    Mitral valve insufficiency    Osteoarthritis    SCCA (squamous cell carcinoma) of skin 03/22/2021   Bridge of nose (in situ)   Skull fracture (HCC)    age 25   Squamous cell carcinoma of skin 03/22/2021   Right forehead (in situ)   TIA (transient ischemic attack) 2020   Varicose vein of leg     Social History:  Social  History   Socioeconomic History   Marital status: Married    Spouse name: Not on file   Number of children: 0   Years of education: Not on file   Highest education level: Not on file  Occupational History   Not on file  Tobacco Use   Smoking status: Former    Current packs/day: 0.00    Average packs/day: 1 pack/day for 20.0 years (20.0 ttl pk-yrs)    Types: Cigarettes    Start date: 29    Quit date: 31    Years since quitting: 45.5   Smokeless  tobacco: Never  Vaping Use   Vaping status: Never Used  Substance and Sexual Activity   Alcohol use: Yes    Comment: social/rarely   Drug use: Never   Sexual activity: Yes  Other Topics Concern   Not on file  Social History Narrative   Not on file   Social Drivers of Health   Financial Resource Strain: Not on file  Food Insecurity: No Food Insecurity (03/14/2024)   Hunger Vital Sign    Worried About Running Out of Food in the Last Year: Never true    Ran Out of Food in the Last Year: Never true  Transportation Needs: No Transportation Needs (03/14/2024)   PRAPARE - Administrator, Civil Service (Medical): No    Lack of Transportation (Non-Medical): No  Physical Activity: Not on file  Stress: Not on file  Social Connections: Moderately Isolated (03/14/2024)   Social Connection and Isolation Panel    Frequency of Communication with Friends and Family: More than three times a week    Frequency of Social Gatherings with Friends and Family: More than three times a week    Attends Religious Services: Never    Database administrator or Organizations: No    Attends Banker Meetings: Never    Marital Status: Married  Catering manager Violence: Not At Risk (03/14/2024)   Humiliation, Afraid, Rape, and Kick questionnaire    Fear of Current or Ex-Partner: No    Emotionally Abused: No    Physically Abused: No    Sexually Abused: No    Medications:   Current Outpatient Medications on File Prior to Visit  Medication Sig Dispense Refill   aspirin  EC 81 MG tablet Take 1 tablet (81 mg total) by mouth daily. Swallow whole. 30 tablet 2   atorvastatin  (LIPITOR) 20 MG tablet Take 1 tablet (20 mg total) by mouth daily. Hold until you are taking Paxlovid      Cholecalciferol  (VITAMIN D3) 50 MCG (2000 UT) capsule Take 2,000 Units by mouth daily.     Clobetasol  Prop Emollient Base (CLOBETASOL  PROPIONATE E) 0.05 % emollient cream Apply 1 Application topically 2 (two) times  daily. (Patient taking differently: Apply 1 Application topically 2 (two) times daily as needed (for rash).) 60 g 10   clopidogrel  (PLAVIX ) 75 MG tablet Take 1 tablet (75 mg total) by mouth daily. 30 tablet 1   desonide  (DESOWEN ) 0.05 % cream Apply topically 2 (two) times daily as needed (Rash). 60 g 11   donepezil  (ARICEPT ) 5 MG tablet Take 5 mg by mouth daily.     lisinopril  (ZESTRIL ) 10 MG tablet Take 10 mg by mouth daily.     metFORMIN  (GLUCOPHAGE ) 500 MG tablet Take 1,000 mg by mouth 2 (two) times daily with a meal.     Polyethyl Glycol-Propyl Glycol (SYSTANE FREE OP) Apply 1 drop to eye daily as needed (dryness/irritation).  REPATHA  SURECLICK 140 MG/ML SOAJ Inject 140 mg into the skin every 14 (fourteen) days. Sunday     Semaglutide ,0.25 or 0.5MG /DOS, 2 MG/3ML SOPN Inject 0.5 mg into the skin once a week. Saturdays     cycloSPORINE  (RESTASIS ) 0.05 % ophthalmic emulsion Place 1 drop into both eyes 2 (two) times daily.     No current facility-administered medications on file prior to visit.    Allergies:   Allergies  Allergen Reactions   Clindamycin/Lincomycin Other (See Comments)    Cannot tolerate mycins causes C-Diff   Erythromycin     Other Reaction(s): Not available  erythromycin   Milk-Related Compounds Diarrhea   Tetracyclines & Related Other (See Comments)    Feel really nervous    Physical Exam General: well developed, well nourished elderly Caucasian lady, seated, in no evident distress Head: head normocephalic and atraumatic.  Neck: supple with no carotid or supraclavicular bruits Cardiovascular: regular rate and rhythm, no murmurs Musculoskeletal: no deformity Skin:  no rash/petichiae Vascular:  Normal pulses all extremities Vitals:   03/24/24 1314  BP: 139/79  Pulse: 75   Neurologic Exam Mental Status: Awake and fully alert. disoriented to place and time. Recent and remote memory . Attention span, concentration and fund of knowledge . Mood and affect  appropriate.  MMSE score 14/30 with deficits in orientation, attention calculation and recall.  Clock drawing 3/4.  Able to name only 3 animals which can walk on forelegs.  Geriatric depression scale 5 not depressed.  Recall 0/3.  Functional activity questionnaire she scored 26 exhibiting a high degree of dependence.   Cranial Nerves: Fundoscopic exam reveals sharp disc margins. Pupils equal, briskly reactive to light. Extraocular movements full without nystagmus. Visual fields full to confrontation. Hearing intact. Facial sensation intact. Face, tongue, palate moves normally and symmetrically.  Motor: Normal bulk and tone. Normal strength in all tested extremity muscles. Sensory.: intact to touch ,pinprick .position and vibratory sensation.  Coordination: Rapid alternating movements normal in all extremities. Finger-to-nose and heel-to-shin performed accurately bilaterally. Gait and Station: Arises from chair without difficulty. Stance is broad-based gait demonstrates mild left foot drop and dragging of the left leg. Reflexes: 1+ and symmetric. Toes downgoing.   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  She also has memory loss and mixed dementia vascular risk factors of hypertension, diabetes, hyperlipidemia     PLAN:I had a long discussion with the patient and her wife regarding her recent stroke, intracranial multivessel stenosis and memory loss and mixed dementia and answered questions.  I recommend aspirin  and Plavix  for 3 months  followed by Plavix  alone and aggressive risk factor modification.  Strict control of hypertension and blood pressure goal below 130/90, lipids with LDL cholesterol goal below 70 mg percent and diabetes with hemoglobin A1c goal below 6.5%.  She was encouraged to eat a healthy diet with lots of fruits, vegetables, cereals, whole grains and to be active and exercise regularly.  We  also discussed her memory loss and cognitive impairment and I recommend further evaluation by checking memory panel labs, EEG.  We discussed memory compensation strategies.  I encouraged her to increase participation in cognitively challenging activities like solving crossword puzzles, playing bridge and sudoku.  Continue Aricept  5 mg daily for a month and then increase to 10 mg daily for memory loss.  Return for follow-up in the future in 3 months with my nurse practitioner or call earlier if necessary.  Or call earlier if necessary.  Greater than 50% of time during this 40 minute visit was spent on counseling,explanation of diagnosis of stroke and dementia and intracranial stenosis, planning of further management, discussion with patient and family and coordination of care Eather Popp, MD Note: This document was prepared with digital dictation and possible smart phrase technology. Any transcriptional errors that result from this process are unintentional

## 2024-03-24 NOTE — Patient Instructions (Addendum)
 I had a long discussion with the patient and her wife regarding her recent stroke, intracranial multivessel stenosis and memory loss and mixed dementia and answered questions.  I recommend aspirin  and Plavix  for 3 months followed by Plavix  alone and aggressive risk factor modification.  Strict control of hypertension and blood pressure goal below 130/90, lipids with LDL cholesterol goal below 70 mg percent and diabetes with hemoglobin A1c goal below 6.5%.  She was encouraged to eat a healthy diet with lots of fruits, vegetables, cereals, whole grains and to be active and exercise regularly.  We also discussed her memory loss and cognitive impairment and I recommend further evaluation by checking memory panel labs, EEG.  We discussed memory compensation strategies.  I encouraged her to increase participation in cognitively challenging activities like solving crossword puzzles, playing bridge and sudoku.  Continue Aricept  5 mg daily for a month and then increase to 10 mg daily for memory loss.  Return for follow-up in the future in 3 months with my nurse practitioner or call earlier if necessary.  Or call earlier if necessary.  Dementia Caregiver Guide Dementia is a condition that affects the way the brain works. It often affects thinking and memory. A person with dementia may: Forget things. Have trouble talking or responding to your questions. Have trouble paying attention. Have trouble thinking clearly and making good decisions. Get lost or wander away from home or other places. Have big changes in their mood or emotions. They may: Feel very worried, nervous, or depressed. Have angry outbursts. Be suspicious or accuse you of things. Have childlike behavior and language. Taking care of someone with dementia can be a challenge. The tips below can help you care for the person. How to help manage lifestyle changes Dementia usually gets worse slowly over time. In the early stages, people with dementia  can stay safe and take care of themselves with some help. In later stages, they need help with daily tasks like getting dressed, grooming, and going to the bathroom. Communicating When the person is talking and seems frustrated, make eye contact and hold the person's hand. Ask questions that can be answered with a yes or no. Use simple words and a calm voice. Only give one direction at a time. Limit choices for the person. Too many choices can be stressful. Avoid correcting the person in a negative way. If the person can't find the right words, gently try to help. Preventing injury  Keep floors clear. Remove rugs, magazine racks, and floor lamps. Keep hallways well lit, especially at night. Put a handrail and nonslip mat in the bathtub or shower. Put childproof locks on cabinets that have dangerous items in them. These items include medicine, alcohol, guns, cleaning products, and sharp tools. For doors to the outside, put locks where the person can't see or reach them. This helps keep the person from going out of the house and getting lost. Be ready for emergencies. Keep a list of emergency phone numbers and addresses close by. Remove car keys and lock garage doors so the person doesn't try to drive. Have the person wear a bracelet that tracks where they are and shows that they're a person with memory loss. This should be worn at all times for safety. Helping with daily life  Keep the person on track with their daily routine. Try to identify areas where the person may need help. Be supportive, patient, calm, and encouraging. Gently remind the person that adjusting to changes takes time. Help  with the tasks that the person has asked for help with. Keep the person involved in daily tasks and decisions as much as you can. Encourage conversation, but try not to get frustrated if the person struggles to find words or doesn't seem to appreciate your help. Other tips Think about any safety risks  and take steps to avoid them. Keep things organized: Organize medicines in a pill box for each day of the week. Keep a calendar in a central place. Use it to remind the person of health care visits or other activities. Create a plan to handle any legal or financial matters. Get help from a professional if needed. Help make sure the person: Takes medicines only as told by their health care providers. Eats regular, healthy meals. They should also drink plenty of fluids. Goes to all scheduled health care appointments. Gets regular sleep. Taking care of yourself Being a caregiver for someone with dementia can be hard. You may feel stressed and have many other emotions. It's important to also take care of yourself. Here are some tips: Find out about services that can provide short-term care for the person. This is called respite care. It can allow you to take a break when you need one. Find healthy ways to deal with stress. Some ways include: Spending time with other people. Exercising. Meditating or doing deep breathing exercises. Take care of your own health by: Getting enough sleep. Eating healthy foods. Getting regular exercise. Join a support group with others who are caregivers. These groups can help you: Learn other ways to deal with stress. Share experiences with others. Get emotional comfort and support. Learn about caregiving as the disease gets worse. Find resources in your community. Where to find support: Many people and organizations offer support. These include: Support groups for people with dementia. Support groups for caregivers. Counselors or therapists. Home health care services. Adult day care centers. Where to find more information Alzheimer's Association: [employer] Family Caregiver Alliance: caregiver.org Alzheimer's Foundation of [place]: alzfdn.org Contact a health care provider if: The person's health is quickly getting worse. You're no longer able to care  for the person. Caring for the person is affecting your physical and emotional health. You're feeling worried, nervous, or depressed about caring for the person. Get help right away if: You feel like the person may hurt themselves or others. The person has talked about taking their own life. These symptoms may be an emergency. Take one of these steps right away: Go to your nearest emergency room. Call 911. Call the National Suicide Prevention Lifeline at 330-728-3923 or 988. Text the Crisis Text Line at 626-554-3436. This information is not intended to replace advice given to you by your health care provider. Make sure you discuss any questions you have with your health care provider. Document Revised: 11/30/2022 Document Reviewed: 11/30/2022 Elsevier Patient Education  2024 [employer].

## 2024-03-25 ENCOUNTER — Ambulatory Visit: Payer: Self-pay | Admitting: Neurology

## 2024-03-25 DIAGNOSIS — I69398 Other sequelae of cerebral infarction: Secondary | ICD-10-CM | POA: Diagnosis not present

## 2024-03-25 DIAGNOSIS — I69354 Hemiplegia and hemiparesis following cerebral infarction affecting left non-dominant side: Secondary | ICD-10-CM | POA: Diagnosis not present

## 2024-03-25 LAB — DEMENTIA PANEL
Homocysteine: 18.4 umol/L (ref 0.0–19.2)
RPR Ser Ql: NONREACTIVE
TSH: 1.77 u[IU]/mL (ref 0.450–4.500)
Vitamin B-12: 216 pg/mL — ABNORMAL LOW (ref 232–1245)

## 2024-03-25 NOTE — Progress Notes (Signed)
 Carelink Summary Report / Loop Recorder

## 2024-03-26 ENCOUNTER — Encounter: Payer: Self-pay | Admitting: *Deleted

## 2024-03-26 ENCOUNTER — Other Ambulatory Visit: Payer: MEDICARE | Admitting: *Deleted

## 2024-03-26 DIAGNOSIS — R29898 Other symptoms and signs involving the musculoskeletal system: Secondary | ICD-10-CM | POA: Diagnosis not present

## 2024-03-26 DIAGNOSIS — E1140 Type 2 diabetes mellitus with diabetic neuropathy, unspecified: Secondary | ICD-10-CM | POA: Diagnosis not present

## 2024-03-26 DIAGNOSIS — I69354 Hemiplegia and hemiparesis following cerebral infarction affecting left non-dominant side: Secondary | ICD-10-CM | POA: Diagnosis not present

## 2024-03-26 DIAGNOSIS — I69398 Other sequelae of cerebral infarction: Secondary | ICD-10-CM | POA: Diagnosis not present

## 2024-03-27 DIAGNOSIS — I69354 Hemiplegia and hemiparesis following cerebral infarction affecting left non-dominant side: Secondary | ICD-10-CM | POA: Diagnosis not present

## 2024-03-27 DIAGNOSIS — I69398 Other sequelae of cerebral infarction: Secondary | ICD-10-CM | POA: Diagnosis not present

## 2024-04-01 DIAGNOSIS — I69398 Other sequelae of cerebral infarction: Secondary | ICD-10-CM | POA: Diagnosis not present

## 2024-04-01 DIAGNOSIS — I69354 Hemiplegia and hemiparesis following cerebral infarction affecting left non-dominant side: Secondary | ICD-10-CM | POA: Diagnosis not present

## 2024-04-02 ENCOUNTER — Ambulatory Visit: Payer: MEDICARE | Admitting: Neurology

## 2024-04-02 DIAGNOSIS — G40009 Localization-related (focal) (partial) idiopathic epilepsy and epileptic syndromes with seizures of localized onset, not intractable, without status epilepticus: Secondary | ICD-10-CM | POA: Diagnosis not present

## 2024-04-03 DIAGNOSIS — I69398 Other sequelae of cerebral infarction: Secondary | ICD-10-CM | POA: Diagnosis not present

## 2024-04-03 DIAGNOSIS — I69354 Hemiplegia and hemiparesis following cerebral infarction affecting left non-dominant side: Secondary | ICD-10-CM | POA: Diagnosis not present

## 2024-04-04 ENCOUNTER — Ambulatory Visit: Payer: Self-pay | Admitting: Cardiology

## 2024-04-07 DIAGNOSIS — I69398 Other sequelae of cerebral infarction: Secondary | ICD-10-CM | POA: Diagnosis not present

## 2024-04-07 DIAGNOSIS — I69354 Hemiplegia and hemiparesis following cerebral infarction affecting left non-dominant side: Secondary | ICD-10-CM | POA: Diagnosis not present

## 2024-04-07 NOTE — Progress Notes (Signed)
  [facility] [address] [place]. [place] [address] [phone]       Electroencephalogram Procedure Note  [name] Date of Birth:  October 29, 1945 Medical Record Number:  [id number]   Indications: Diagnostic Date of Procedure 04/02/2024 Medications: none Clinical history : 78 year old patient being evaluated for seizures. Technical Description  This study was performed using 17 channel digital electroencephalographic recording equipment. International 10-20 electrode placement was used. The record was obtained with the patient awake, drowsy, and asleep.  The record is of fair technical quality for purposes of interpretation.   Activation Procedures:  photic stimulation . EEG Description Awake: Alpha Activity: The waking state record contains a well-defined bi-occipital alpha rhythm of  low amplitude with a dominant frequency of 8 Hz. Reactivity is uncertain.  No paroxsymal activity, spikes, or sharp waves are noted.  Intermittent 6 to 7 Hz theta range generalized diffuse slowing is noted. Technical component of study is adequate. EKG tracing shows regular sinus rhythm Length of this recording is 25 minutes and 58 seconds  Sleep: With drowsiness, there is attenuation of the background alpha activity. As the patient enters into light sleep, vertex waves and symmetrical spindles are noted. K complexes are noted in sleep. Transition to the waking state is unremarkable.   Result of Activation Procedures: Hyperventilation: N/A. Photo Stimulation: No photic driving response is noted.  Summary This is a mildly abnormal EEG due to the presence of mild bihemispheric slowing which is a nonspecific finding seen in a variety of conditions including old age, dementia, toxic, metabolic, hypoxic and ischemic conditions.  No definite epileptiform activity is noted.

## 2024-04-09 ENCOUNTER — Ambulatory Visit: Payer: MEDICARE

## 2024-04-09 ENCOUNTER — Ambulatory Visit: Payer: Self-pay | Admitting: Cardiology

## 2024-04-09 DIAGNOSIS — I639 Cerebral infarction, unspecified: Secondary | ICD-10-CM

## 2024-04-09 LAB — CUP PACEART REMOTE DEVICE CHECK
Date Time Interrogation Session: 20250806232527
Implantable Pulse Generator Implant Date: 20240212

## 2024-04-10 DIAGNOSIS — I69354 Hemiplegia and hemiparesis following cerebral infarction affecting left non-dominant side: Secondary | ICD-10-CM | POA: Diagnosis not present

## 2024-04-10 DIAGNOSIS — I69398 Other sequelae of cerebral infarction: Secondary | ICD-10-CM | POA: Diagnosis not present

## 2024-04-11 ENCOUNTER — Encounter (HOSPITAL_COMMUNITY): Payer: Self-pay

## 2024-04-11 ENCOUNTER — Ambulatory Visit (HOSPITAL_COMMUNITY)
Admission: EM | Admit: 2024-04-11 | Discharge: 2024-04-11 | Disposition: A | Discharge status: Other Acute Inpatient Hospital | Payer: MEDICARE | Attending: Family Medicine | Admitting: Family Medicine

## 2024-04-11 ENCOUNTER — Encounter (HOSPITAL_COMMUNITY): Payer: Self-pay | Admitting: Emergency Medicine

## 2024-04-11 ENCOUNTER — Emergency Department (HOSPITAL_COMMUNITY): Payer: MEDICARE

## 2024-04-11 ENCOUNTER — Inpatient Hospital Stay (HOSPITAL_COMMUNITY)
Admission: EM | Admit: 2024-04-11 | Discharge: 2024-04-13 | DRG: 064 | Disposition: A | Discharge status: Home Health | Payer: MEDICARE | Source: Skilled Nursing Facility | Attending: Internal Medicine | Admitting: Internal Medicine

## 2024-04-11 ENCOUNTER — Other Ambulatory Visit: Payer: Self-pay

## 2024-04-11 DIAGNOSIS — Z7902 Long term (current) use of antithrombotics/antiplatelets: Secondary | ICD-10-CM

## 2024-04-11 DIAGNOSIS — I639 Cerebral infarction, unspecified: Secondary | ICD-10-CM | POA: Diagnosis not present

## 2024-04-11 DIAGNOSIS — G510 Bell's palsy: Secondary | ICD-10-CM | POA: Diagnosis present

## 2024-04-11 DIAGNOSIS — R7989 Other specified abnormal findings of blood chemistry: Secondary | ICD-10-CM | POA: Diagnosis not present

## 2024-04-11 DIAGNOSIS — I340 Nonrheumatic mitral (valve) insufficiency: Secondary | ICD-10-CM | POA: Diagnosis not present

## 2024-04-11 DIAGNOSIS — M179 Osteoarthritis of knee, unspecified: Secondary | ICD-10-CM | POA: Diagnosis present

## 2024-04-11 DIAGNOSIS — I63521 Cerebral infarction due to unspecified occlusion or stenosis of right anterior cerebral artery: Secondary | ICD-10-CM | POA: Diagnosis not present

## 2024-04-11 DIAGNOSIS — Z91011 Allergy to milk products: Secondary | ICD-10-CM

## 2024-04-11 DIAGNOSIS — E1122 Type 2 diabetes mellitus with diabetic chronic kidney disease: Secondary | ICD-10-CM | POA: Diagnosis present

## 2024-04-11 DIAGNOSIS — R569 Unspecified convulsions: Secondary | ICD-10-CM

## 2024-04-11 DIAGNOSIS — I629 Nontraumatic intracranial hemorrhage, unspecified: Secondary | ICD-10-CM

## 2024-04-11 DIAGNOSIS — G9349 Other encephalopathy: Secondary | ICD-10-CM | POA: Diagnosis present

## 2024-04-11 DIAGNOSIS — R410 Disorientation, unspecified: Secondary | ICD-10-CM

## 2024-04-11 DIAGNOSIS — I129 Hypertensive chronic kidney disease with stage 1 through stage 4 chronic kidney disease, or unspecified chronic kidney disease: Secondary | ICD-10-CM | POA: Diagnosis present

## 2024-04-11 DIAGNOSIS — I5189 Other ill-defined heart diseases: Secondary | ICD-10-CM | POA: Diagnosis present

## 2024-04-11 DIAGNOSIS — I69392 Facial weakness following cerebral infarction: Secondary | ICD-10-CM

## 2024-04-11 DIAGNOSIS — F0150 Vascular dementia without behavioral disturbance: Secondary | ICD-10-CM | POA: Diagnosis not present

## 2024-04-11 DIAGNOSIS — E785 Hyperlipidemia, unspecified: Secondary | ICD-10-CM | POA: Diagnosis not present

## 2024-04-11 DIAGNOSIS — Z85828 Personal history of other malignant neoplasm of skin: Secondary | ICD-10-CM | POA: Diagnosis not present

## 2024-04-11 DIAGNOSIS — R29818 Other symptoms and signs involving the nervous system: Secondary | ICD-10-CM | POA: Diagnosis not present

## 2024-04-11 DIAGNOSIS — Z7984 Long term (current) use of oral hypoglycemic drugs: Secondary | ICD-10-CM

## 2024-04-11 DIAGNOSIS — I69391 Dysphagia following cerebral infarction: Secondary | ICD-10-CM

## 2024-04-11 DIAGNOSIS — E1169 Type 2 diabetes mellitus with other specified complication: Secondary | ICD-10-CM | POA: Diagnosis present

## 2024-04-11 DIAGNOSIS — Z87891 Personal history of nicotine dependence: Secondary | ICD-10-CM

## 2024-04-11 DIAGNOSIS — R29703 NIHSS score 3: Secondary | ICD-10-CM | POA: Diagnosis not present

## 2024-04-11 DIAGNOSIS — I618 Other nontraumatic intracerebral hemorrhage: Secondary | ICD-10-CM | POA: Diagnosis present

## 2024-04-11 DIAGNOSIS — Z8249 Family history of ischemic heart disease and other diseases of the circulatory system: Secondary | ICD-10-CM | POA: Diagnosis not present

## 2024-04-11 DIAGNOSIS — M159 Polyosteoarthritis, unspecified: Secondary | ICD-10-CM | POA: Diagnosis present

## 2024-04-11 DIAGNOSIS — Z8673 Personal history of transient ischemic attack (TIA), and cerebral infarction without residual deficits: Secondary | ICD-10-CM

## 2024-04-11 DIAGNOSIS — I6389 Other cerebral infarction: Principal | ICD-10-CM | POA: Diagnosis present

## 2024-04-11 DIAGNOSIS — Z806 Family history of leukemia: Secondary | ICD-10-CM

## 2024-04-11 DIAGNOSIS — Z96651 Presence of right artificial knee joint: Secondary | ICD-10-CM | POA: Diagnosis present

## 2024-04-11 DIAGNOSIS — K219 Gastro-esophageal reflux disease without esophagitis: Secondary | ICD-10-CM | POA: Diagnosis present

## 2024-04-11 DIAGNOSIS — I10 Essential (primary) hypertension: Secondary | ICD-10-CM | POA: Diagnosis present

## 2024-04-11 DIAGNOSIS — N1832 Chronic kidney disease, stage 3b: Secondary | ICD-10-CM | POA: Diagnosis present

## 2024-04-11 DIAGNOSIS — R2689 Other abnormalities of gait and mobility: Secondary | ICD-10-CM

## 2024-04-11 DIAGNOSIS — I6782 Cerebral ischemia: Secondary | ICD-10-CM | POA: Diagnosis not present

## 2024-04-11 DIAGNOSIS — Z79899 Other long term (current) drug therapy: Secondary | ICD-10-CM | POA: Diagnosis not present

## 2024-04-11 DIAGNOSIS — I69354 Hemiplegia and hemiparesis following cerebral infarction affecting left non-dominant side: Secondary | ICD-10-CM | POA: Diagnosis not present

## 2024-04-11 DIAGNOSIS — R4701 Aphasia: Secondary | ICD-10-CM | POA: Diagnosis present

## 2024-04-11 DIAGNOSIS — Z86718 Personal history of other venous thrombosis and embolism: Secondary | ICD-10-CM

## 2024-04-11 DIAGNOSIS — Z823 Family history of stroke: Secondary | ICD-10-CM

## 2024-04-11 DIAGNOSIS — Z7985 Long-term (current) use of injectable non-insulin antidiabetic drugs: Secondary | ICD-10-CM | POA: Diagnosis not present

## 2024-04-11 DIAGNOSIS — G9389 Other specified disorders of brain: Secondary | ICD-10-CM | POA: Diagnosis present

## 2024-04-11 DIAGNOSIS — I6523 Occlusion and stenosis of bilateral carotid arteries: Secondary | ICD-10-CM | POA: Diagnosis not present

## 2024-04-11 DIAGNOSIS — Z881 Allergy status to other antibiotic agents status: Secondary | ICD-10-CM

## 2024-04-11 DIAGNOSIS — E1140 Type 2 diabetes mellitus with diabetic neuropathy, unspecified: Secondary | ICD-10-CM | POA: Diagnosis present

## 2024-04-11 DIAGNOSIS — I7090 Unspecified atherosclerosis: Secondary | ICD-10-CM | POA: Diagnosis not present

## 2024-04-11 DIAGNOSIS — Z7982 Long term (current) use of aspirin: Secondary | ICD-10-CM

## 2024-04-11 DIAGNOSIS — R9431 Abnormal electrocardiogram [ECG] [EKG]: Secondary | ICD-10-CM | POA: Diagnosis not present

## 2024-04-11 DIAGNOSIS — R29705 NIHSS score 5: Secondary | ICD-10-CM | POA: Diagnosis present

## 2024-04-11 DIAGNOSIS — R1310 Dysphagia, unspecified: Secondary | ICD-10-CM | POA: Diagnosis present

## 2024-04-11 DIAGNOSIS — R9089 Other abnormal findings on diagnostic imaging of central nervous system: Secondary | ICD-10-CM | POA: Diagnosis not present

## 2024-04-11 DIAGNOSIS — Z0389 Encounter for observation for other suspected diseases and conditions ruled out: Secondary | ICD-10-CM | POA: Diagnosis not present

## 2024-04-11 DIAGNOSIS — E1151 Type 2 diabetes mellitus with diabetic peripheral angiopathy without gangrene: Secondary | ICD-10-CM | POA: Diagnosis not present

## 2024-04-11 LAB — COMPREHENSIVE METABOLIC PANEL WITH GFR
ALT: 18 U/L (ref 0–44)
AST: 25 U/L (ref 15–41)
Albumin: 3.8 g/dL (ref 3.5–5.0)
Alkaline Phosphatase: 33 U/L — ABNORMAL LOW (ref 38–126)
Anion gap: 15 (ref 5–15)
BUN: 26 mg/dL — ABNORMAL HIGH (ref 8–23)
CO2: 21 mmol/L — ABNORMAL LOW (ref 22–32)
Calcium: 9.8 mg/dL (ref 8.9–10.3)
Chloride: 103 mmol/L (ref 98–111)
Creatinine, Ser: 1.42 mg/dL — ABNORMAL HIGH (ref 0.44–1.00)
GFR, Estimated: 37 mL/min — ABNORMAL LOW (ref >60)
Glucose, Bld: 144 mg/dL — ABNORMAL HIGH (ref 70–99)
Potassium: 4.2 mmol/L (ref 3.5–5.1)
Sodium: 139 mmol/L (ref 135–145)
Total Bilirubin: 0.6 mg/dL (ref 0.0–1.2)
Total Protein: 7.2 g/dL (ref 6.5–8.1)

## 2024-04-11 LAB — DIFFERENTIAL
Abs Immature Granulocytes: 0.05 K/uL (ref 0.00–0.07)
Basophils Absolute: 0.1 K/uL (ref 0.0–0.1)
Basophils Relative: 1 %
Eosinophils Absolute: 0.1 K/uL (ref 0.0–0.5)
Eosinophils Relative: 1 %
Immature Granulocytes: 1 %
Lymphocytes Relative: 12 %
Lymphs Abs: 1.1 K/uL (ref 0.7–4.0)
Monocytes Absolute: 0.6 K/uL (ref 0.1–1.0)
Monocytes Relative: 6 %
Neutro Abs: 7.5 K/uL (ref 1.7–7.7)
Neutrophils Relative %: 79 %

## 2024-04-11 LAB — CBC
HCT: 41.7 % (ref 36.0–46.0)
Hemoglobin: 13.6 g/dL (ref 12.0–15.0)
MCH: 30.8 pg (ref 26.0–34.0)
MCHC: 32.6 g/dL (ref 30.0–36.0)
MCV: 94.6 fL (ref 80.0–100.0)
Platelets: 247 K/uL (ref 150–400)
RBC: 4.41 MIL/uL (ref 3.87–5.11)
RDW: 13.3 % (ref 11.5–15.5)
WBC: 9.5 K/uL (ref 4.0–10.5)
nRBC: 0 % (ref 0.0–0.2)

## 2024-04-11 LAB — I-STAT CHEM 8, ED
BUN: 28 mg/dL — ABNORMAL HIGH (ref 8–23)
Calcium, Ion: 1.19 mmol/L (ref 1.15–1.40)
Chloride: 104 mmol/L (ref 98–111)
Creatinine, Ser: 1.5 mg/dL — ABNORMAL HIGH (ref 0.44–1.00)
Glucose, Bld: 143 mg/dL — ABNORMAL HIGH (ref 70–99)
HCT: 42 % (ref 36.0–46.0)
Hemoglobin: 14.3 g/dL (ref 12.0–15.0)
Potassium: 4.2 mmol/L (ref 3.5–5.1)
Sodium: 139 mmol/L (ref 135–145)
TCO2: 22 mmol/L (ref 22–32)

## 2024-04-11 LAB — URINALYSIS, ROUTINE W REFLEX MICROSCOPIC
Bilirubin Urine: NEGATIVE
Glucose, UA: NEGATIVE mg/dL
Hgb urine dipstick: NEGATIVE
Ketones, ur: NEGATIVE mg/dL
Leukocytes,Ua: NEGATIVE
Nitrite: NEGATIVE
Protein, ur: NEGATIVE mg/dL
Specific Gravity, Urine: 1.018 (ref 1.005–1.030)
pH: 5 (ref 5.0–8.0)

## 2024-04-11 LAB — ETHANOL: Alcohol, Ethyl (B): <15 mg/dL (ref <15)

## 2024-04-11 LAB — GLUCOSE, CAPILLARY
Glucose-Capillary: 131 mg/dL — ABNORMAL HIGH (ref 70–99)
Glucose-Capillary: 131 mg/dL — ABNORMAL HIGH (ref 70–99)

## 2024-04-11 LAB — MAGNESIUM: Magnesium: 1.2 mg/dL — ABNORMAL LOW (ref 1.7–2.4)

## 2024-04-11 LAB — D-DIMER, QUANTITATIVE: D-Dimer, Quant: 3.81 ug{FEU}/mL — ABNORMAL HIGH (ref 0.00–0.50)

## 2024-04-11 LAB — LIPID PANEL
Cholesterol: 120 mg/dL (ref 0–200)
HDL: 57 mg/dL (ref >40)
LDL Cholesterol: 30 mg/dL (ref 0–99)
Total CHOL/HDL Ratio: 2.1 ratio
Triglycerides: 164 mg/dL — ABNORMAL HIGH (ref <150)
VLDL: 33 mg/dL (ref 0–40)

## 2024-04-11 LAB — CBG MONITORING, ED: Glucose-Capillary: 140 mg/dL — ABNORMAL HIGH (ref 70–99)

## 2024-04-11 LAB — PROTIME-INR
INR: 1 (ref 0.8–1.2)
Prothrombin Time: 14.1 s (ref 11.4–15.2)

## 2024-04-11 LAB — APTT: aPTT: 25 s (ref 24–36)

## 2024-04-11 LAB — POCT FASTING CBG KUC MANUAL ENTRY: POCT Glucose (KUC): 177 mg/dL — AB (ref 70–99)

## 2024-04-11 MED ORDER — SODIUM CHLORIDE 0.9% FLUSH: 3.0000 mL | Freq: Once | INTRAVENOUS | Status: DC

## 2024-04-11 MED ORDER — INSULIN ASPART 100 UNIT/ML IJ SOLN
0.0000 [IU] | Freq: Three times a day (TID) | INTRAMUSCULAR | Status: DC
  Administered 2024-04-12 – 2024-04-13 (×4): 2 [IU] via SUBCUTANEOUS

## 2024-04-11 MED ORDER — HYDRALAZINE HCL 20 MG/ML IJ SOLN: 10.0000 mg | Freq: Four times a day (QID) | INTRAMUSCULAR | Status: DC | PRN

## 2024-04-11 MED ORDER — STROKE: EARLY STAGES OF RECOVERY BOOK
Freq: Once | Status: DC
  Filled 2024-04-11: qty 1

## 2024-04-11 MED ORDER — ACETAMINOPHEN 650 MG RE SUPP: 650.0000 mg | RECTAL | Status: DC | PRN

## 2024-04-11 MED ORDER — CYANOCOBALAMIN 1000 MCG/ML IJ SOLN
1000.0000 ug | Freq: Once | INTRAMUSCULAR | Status: AC
  Administered 2024-04-11: 1000 ug via INTRAMUSCULAR
  Filled 2024-04-11: qty 1

## 2024-04-11 MED ORDER — SODIUM CHLORIDE 0.9 % IV SOLN: INTRAVENOUS | Status: DC

## 2024-04-11 MED ORDER — ACETAMINOPHEN 160 MG/5ML PO SOLN: 650.0000 mg | ORAL | Status: DC | PRN

## 2024-04-11 MED ORDER — ACETAMINOPHEN 325 MG PO TABS
650.0000 mg | ORAL_TABLET | ORAL | Status: DC | PRN
Start: 2024-04-11 — End: 2024-04-13

## 2024-04-11 NOTE — ED Notes (Signed)
 Patient is being discharged from the Urgent Care and sent to the Emergency Department via carelink . Per Dr Rolinda, patient is in need of higher level of care due to Code Stroke. Patient is aware and verbalizes understanding of plan of care.  Vitals:   04/11/24 1116  BP: (!) 129/53  Pulse: 80  Resp: 20  Temp: 97.8 F (36.6 C)  SpO2: 97%

## 2024-04-11 NOTE — Hospital Course (Addendum)
 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 In the ED afebrile hemodynamically stable.  Labs with creatinine 1.4 stable CBC LDL 30,UA unremarkable glucose 144 Patient was admitted to rule out a stroke and for further workup.  Neurology was consulted At this point complete stroke workup see below, Antiplatelet recommendation as per neurology see below  Subjective: Seen and examined Patient doing fairly well no new complaints Overnight blood pressure variable from 120s to 180s systolic Labs reviewed creatinine slightly better 1.2 normal CBC cbg 103  Discharge diagnoses: Acute ischemic stroke w/ hemorrhagic conversion-left caudate with mild hemorrhage Recent stroke in July/08/2024 with residual left-sided weakness 2/2 large vessel atherosclerosis: Patient underwent extensive workup recently, current workup as below- MRI Brain: Acute versus subacute infarct left caudate with mild hemorrhage no mass effect, remote infarcts chronic microvascular ischemic changes> follow-up CT head this morning with acute/subacute left periventricular white matter infarct stable chronic anterior left frontoparietal encephalomalacia and remote infarct right occipital lobe CTA head and neck: Not performed as patient not having symptoms of large vessel occlusion Echocardiogram: Completed on July/13/25 EF 60 to 65% no RWMA G1 DD AV tricuspid MV normal no atrial level shunt detected, Recently she was on aspirin /Plavix  from July 14- w/ plan to continue for 3 months then Plavix   alone Now No antiplatelets or anticoagulants due to hemorrhagic transformation> await final recommendation from neurology-who plans to resume  as planned before w/ 3 mo DAPT then Plavix  alone. Resume home lisinopril , continue home Lipitor 20 mg. LDL is at 30 goal<70, A1c 8.4 in July,goal< 7-continue risk factor modification EEG shows mild diffuse encephalopathy.  UA without evidence of UTI. Cont PT PT OT speech follow-up As previously planned states outpatient follow-up with [facility] in 12 weeks.  Elevated D-dimer 3.8: Workup shows negative VQ scan.  Duplex is pending.  Less likely reason for recurrent stroke given no PFO on her last echo AND her stroke was felt to be due to large vessel atherosclerosis last time   Baseline dementia: Alert very pleasant continue Aricept  aaox2 recent decline.  Recently seen by neurology she does have decline in her memory and cognition.  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  Avoid nephrotoxins, monitor renal function Recent Labs    03/14/24 1239 03/14/24 2136 03/15/24 0632 04/11/24 1148 04/11/24 1154 04/12/24 0807 04/13/24 0626  BUN 26*  --  22 26* 28* 22 16  CREATININE 1.65* 1.37* 1.29* 1.42* 1.50* 1.34* 1.27*  CO2 25  --  21* 21*  --  24 26  K 4.2  --  3.9 4.2 4.2 4.4 4.4    Hypomagnesemia 1.2 on admission: Replaced and now resolved   Hyperlipidemia: PTA on Lipitor and also takes outpatient Repatha .Continue home regimen  Type 2 diabetes w/ neuropathy: PTA on metformin  and semaglutide , A1c recently on 8.  Currently well-controlled on sliding scale continue CBG ACHS Recent Labs  Lab 04/12/24 1255 04/12/24 1759 04/12/24 2110 04/13/24 0732 04/13/24 1128  GLUCAP 137* 137* 103* 103* 126*    OA PT OT.  Symptomatic management  Essential hypertension: PTA on lisinopril  resumed and dose  doubled to 20 mg-follow-up with PCP to adjust medication if blood pressure not at goal for SBP goal <  160  Mobility: PT Orders: Active PT Follow up Rec: Home Health Pt8/07/2024 1228   DVT prophylaxis: enoxaparin  (LOVENOX ) injection 40 mg Start: 04/12/24 1645 Code Status:   Code Status: Full Code Family Communication: plan of care discussed with patient/spouse at bedside. Patient status is: Remains hospitalized because of severity of illness Level of care: Telemetry Medical   Dispo: The patient is from: home with spouse            Anticipated disposition: HOME W/ HH Objective: Vitals last 24 hrs: Vitals:   04/13/24 0733 04/13/24 1131 04/13/24 1305 04/13/24 1400  BP: (!) 188/86 (!) 182/60 (!) 180/80 (!) 141/58  Pulse: 64 61 62 75  Resp: 20 16 19 19   Temp: 97.8 F (36.6 C) 97.8 F (36.6 C)    TempSrc: Oral Oral    SpO2: 92% 97% 97% 96%  Weight:      Height:        Physical Examination: General exam: Alert oriented to self place  HEENT:Oral mucosa moist, Ear/Nose WNL grossly Respiratory system: Bilaterally clear BS,no use of accessory muscle Cardiovascular system: S1 & S2 +, No JVD. Gastrointestinal system: Abdomen soft,NT,ND, BS+ Nervous System: Alert, awake, pleasantly confused but able to interact. Rt facial weakness chronic. Extremities: LE edema neg, distal extremities warm.  Skin: No rashes,no icterus. MSK: Normal muscle bulk,tone, power   Medications reviewed:  Scheduled Meds:   stroke: early stages of recovery book   Does not apply Once   atorvastatin   20 mg Oral Daily   donepezil   5 mg Oral QHS   enoxaparin  (LOVENOX ) injection  40 mg Subcutaneous Daily   feeding supplement (KATE FARMS STANDARD 1.4)  325 mL Oral BID BM   insulin  aspart  0-15 Units Subcutaneous TID WC   lisinopril   10 mg Oral Daily   multivitamin with minerals  1 tablet Oral Daily   sodium chloride  flush  3 mL Intravenous Once   Continuous Infusions:  sodium chloride  Stopped (04/13/24 1235)   Diet: Diet Order             Diet Carb Modified Fluid consistency: Thin; Room service  appropriate? Yes with Assist  Diet effective now           Diet Carb Modified

## 2024-04-11 NOTE — ED Notes (Signed)
Patient ambulatory to bathroom with assistance

## 2024-04-11 NOTE — ED Notes (Signed)
 Jamie B., RN/Charge nurse notified of Code Stroke via Carelink.

## 2024-04-11 NOTE — ED Provider Notes (Signed)
 Patient was initially seen by Dr. Gennaro.  Please see her note.  Patient presented with acute stroke symptoms.  Case was discussed with neurology.  MRI was pending at the time of shift change.  MRI does show findings of acute versus subacute infarct in the left caudate with mild associated hemorrhage.  Dr. Deedra has reviewed these findings.  I will consult the medical service for admission further evaluation.   Randol Simmonds, MD 04/12/24 [phone]

## 2024-04-11 NOTE — Progress Notes (Signed)
 Routine EEG completed, results pending Neurology review and interpretation

## 2024-04-11 NOTE — ED Triage Notes (Signed)
 Pt to Urgent Care with spouse who st[place] and [place] started to fall due to weakness in bil legs.  Onset approx 9:00 am  AT this time pt has no weakness in arms or legs, no drift  Pt does have facial droop but st's this is not new.  Pt alert but disoriented  at this time

## 2024-04-11 NOTE — ED Notes (Signed)
 Patient transported to MRI

## 2024-04-11 NOTE — ED Notes (Signed)
 Pt taken to IP floor at this time.

## 2024-04-11 NOTE — Consult Note (Signed)
 NEUROLOGY CONSULT NOTE   Date of service: April 11, 2024 Patient Name: Beth Macdonald MRN:  [id number] DOB:  05-21-1946 Chief Complaint: Confusion Requesting Provider: Gennaro Duwaine CROME, DO  History of Present Illness  Beth Macdonald is a 78 y.o. female with hx of recent left frontal infarct likely due to large vessel intracranial atherosclerosis, dementia, diabetes, diabetic neuropathy, DVT, Bell's palsy with right-sided residual weakness, a prior stroke more remote than a month ago with residual left-sided weakness and balance difficulties along with history of TIAs, presents for evaluation from urgent care for concern for stroke. Code stroke was called by the urgent care due to acute loss of balance at 9 AM today which was told to be the last known well.  Facial weakness was also a factor in activating a code stroke but facial weakness is baseline for her from her known Bell's palsy. According to her spouse, she is at baseline, which does include some amount of confusion   LKW: Presumably 0900 hrs. Modified rankin score: 3-Moderate disability-requires help but walks WITHOUT assistance IV Thrombolysis: Recent stroke, baseline exam EVT: Clinical exam not consistent with LVO  NIHSS components Score: Comment  1a Level of Conscious 0[x]  1[]  2[]  3[]      1b LOC Questions 0[]  1[]  2[x]       1c LOC Commands 0[x]  1[]  2[]       2 Best Gaze 0[x]  1[]  2[]       3 Visual 0[x]  1[]  2[]  3[]      4 Facial Palsy 0[]  1[]  2[x]  3[]      5a Motor Arm - left 0[x]  1[]  2[]  3[]  4[]  UN[]    5b Motor Arm - Right 0[x]  1[]  2[]  3[]  4[]  UN[]    6a Motor Leg - Left 0[x]  1[]  2[]  3[]  4[]  UN[]    6b Motor Leg - Right 0[x]  1[]  2[]  3[]  4[]  UN[]    7 Limb Ataxia 0[x]  1[]  2[]  UN[]      8 Sensory 0[x]  1[]  2[]  UN[]      9 Best Language 0[x]  1[]  2[]  3[]      10 Dysarthria 0[]  1[x]  2[]  UN[]      11 Extinct. and Inattention 0[x]  1[]  2[]       TOTAL: 5      ROS  Comprehensive ROS performed and pertinent positives documented in  HPI   Past History   Past Medical History:  Diagnosis Date   Aortic atherosclerosis (HCC)    Diabetes mellitus without complication (HCC)    Type 2   Diabetic neuropathy (HCC)    DVT of proximal lower limb (HCC)    Facial paralysis/Bells palsy    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HCC)    Hyperlipidemia    Mitral valve insufficiency    Osteoarthritis    SCCA (squamous cell carcinoma) of skin 03/22/2021   Bridge of nose (in situ)   Skull fracture (HCC)    age 13   Squamous cell carcinoma of skin 03/22/2021   Right forehead (in situ)   TIA (transient ischemic attack) 2020   Varicose vein of leg     Past Surgical History:  Procedure Laterality Date   ELBOW SURGERY Bilateral    20 yrs ago   ENDOVENOUS ABLATION SAPHENOUS VEIN W/ LASER Bilateral    ENDOVENOUS ABLATION SAPHENOUS VEIN W/ LASER Left 11/19/2019   endovenous laser ablation anterior accessory branch of left GSV and stab phlebectomy> 20 incisions left leg by Medford Blade MD    shoulder Bilateral    [phone]   TOENAIL EXCISION Right 2019  Big toe   TONSILLECTOMY Bilateral    TOTAL KNEE ARTHROPLASTY Right 01/01/2022   Procedure: TOTAL KNEE ARTHROPLASTY;  Surgeon: Melodi Lerner, MD;  Location: [facility];  Service: Orthopedics;  Laterality: Right;    Family History: Family History  Problem Relation Age of Onset   Stroke Mother    Hypertension Father    Dementia Father    Leukemia Brother    Stroke Brother    Colon cancer Neg Hx    Esophageal cancer Neg Hx    Pancreatic cancer Neg Hx    Stomach cancer Neg Hx     Social History  reports that she quit smoking about 45 years ago. Her smoking use included cigarettes. She started smoking about 65 years ago. She has a 20 pack-year smoking history. She has never used smokeless tobacco. She reports current alcohol use. She reports that she does not use drugs.  Allergies  Allergen Reactions   Clindamycin/Lincomycin Other (See Comments)    Cannot tolerate mycins  causes C-Diff   Erythromycin     Other Reaction(s): Not available  erythromycin   Milk-Related Compounds Diarrhea   Tetracyclines & Related Other (See Comments)    Feel really nervous    Medications   Current Facility-Administered Medications:    sodium chloride  flush (NS) 0.9 % injection 3 mL, 3 mL, Intravenous, Once, Kammerer, Megan L, DO  Current Outpatient Medications:    aspirin  EC 81 MG tablet, Take 1 tablet (81 mg total) by mouth daily. Swallow whole., Disp: 30 tablet, Rfl: 2   atorvastatin  (LIPITOR) 20 MG tablet, Take 1 tablet (20 mg total) by mouth daily. Hold until you are taking Paxlovid , Disp: , Rfl:    Cholecalciferol  (VITAMIN D3) 50 MCG (2000 UT) capsule, Take 2,000 Units by mouth daily., Disp: , Rfl:    Clobetasol  Prop Emollient Base (CLOBETASOL  PROPIONATE E) 0.05 % emollient cream, Apply 1 Application topically 2 (two) times daily. (Patient taking differently: Apply 1 Application topically 2 (two) times daily as needed (for rash).), Disp: 60 g, Rfl: 10   clopidogrel  (PLAVIX ) 75 MG tablet, Take 1 tablet (75 mg total) by mouth daily., Disp: 30 tablet, Rfl: 1   cycloSPORINE  (RESTASIS ) 0.05 % ophthalmic emulsion, Place 1 drop into both eyes 2 (two) times daily., Disp: , Rfl:    desonide  (DESOWEN ) 0.05 % cream, Apply topically 2 (two) times daily as needed (Rash)., Disp: 60 g, Rfl: 11   donepezil  (ARICEPT ) 5 MG tablet, Take 5 mg by mouth daily., Disp: , Rfl:    lisinopril  (ZESTRIL ) 10 MG tablet, Take 10 mg by mouth daily., Disp: , Rfl:    metFORMIN  (GLUCOPHAGE ) 500 MG tablet, Take 1,000 mg by mouth 2 (two) times daily with a meal., Disp: , Rfl:    Polyethyl Glycol-Propyl Glycol (SYSTANE FREE OP), Apply 1 drop to eye daily as needed (dryness/irritation)., Disp: , Rfl:    REPATHA  SURECLICK 140 MG/ML SOAJ, Inject 140 mg into the skin every 14 (fourteen) days. Sunday, Disp: , Rfl:    Semaglutide ,0.25 or 0.5MG /DOS, 2 MG/3ML SOPN, Inject 0.5 mg into the skin once a week. Saturdays,  Disp: , Rfl:   Vitals   Vitals:   04/11/24 1158 04/11/24 1200 04/11/24 1200  BP:  (!) 151/65   Pulse: 70 67   Resp: 14 16   Temp: 97.6 F (36.4 C)    TempSrc: Oral    SpO2: 98% 100%   Weight: 77.1 kg  75.6 kg  Height: 5' 8 (1.727 m)  Body mass index is 25.34 kg/m.   Physical Exam  General: Awake alert in no distress HEENT: Normocephalic atraumatic Lungs: Clear Neurological exam Awake alert oriented to self Could not tell me the correct month or age Has some perseveration Mild dysarthria but no evidence of aphasia Cranial nerves: Pupils equal round react light, extraocular movements intact, visual fields full, face is symmetric with LMN type paralysis of the right face which is baseline from a prior Bell's palsy, tongue and palate midline Motor examination with no drift in any of the 4 extremities but has subtle left hemiparesis and left foot drop. Sensation intact with no extinction Coordination with no gross ataxia  Labs/Imaging/Neurodiagnostic studies   CBC:  Recent Labs  Lab 04-23-24 1148 23-Apr-2024 1154  WBC 9.5  --   NEUTROABS 7.5  --   HGB 13.6 14.3  HCT 41.7 42.0  MCV 94.6  --   PLT 247  --    Basic Metabolic Panel:  Lab Results  Component Value Date   NA 139 04/23/24   K 4.2 04-23-2024   CO2 21 (L) 03/15/2024   GLUCOSE 143 (H) 2024/04/23   BUN 28 (H) April 23, 2024   CREATININE 1.50 (H) 04/23/24   CALCIUM  9.1 03/15/2024   GFRNONAA 42 (L) 03/15/2024   GFRAA >60 10/30/2018   Lipid Panel:  Lab Results  Component Value Date   LDLCALC 13 03/15/2024   HgbA1c:  Lab Results  Component Value Date   HGBA1C 8.4 (H) 03/14/2024   INR  Lab Results  Component Value Date   INR 1.0 03/14/2024   APTT  Lab Results  Component Value Date   APTT 26 10/29/2018   CT Head without contrast(Personally reviewed): Aspects 10.  No acute abnormality, specifically no evidence of bleed  CT angio Head and Neck with contrast(Personally reviewed): Not  performed due to patient not having symptoms of large vessel occlusion, and also poor modified Rankin for intervention  ASSESSMENT   SHAKEMA SURITA is a 78 y.o. 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 At this time, she appears to be her baseline on examination which does include confusion and Bell's palsy related residual right face and prior stroke related left-sided mild hemiparesis.  CT head showed no acute changes and there was no bleed.  She has had confusion and balance issues in the past and the acute worsening could be multifactorial but a new stroke should be ruled out with an MRI-not a candidate for thrombectomy or IV thrombolysis due to the reasons listed above.  Recrudescence of old symptoms in the setting of an acute infection could also be the etiology of current presentation.  Seizures should also be considered in the differentials  Impression: Evaluate for new stroke versus recrudescence of old stroke symptoms Evaluate for seizure   RECOMMENDATIONS  MRI brain without contrast Stat EEG If either of above tests are abnormal, will provide further recommendations.  Otherwise check labs including urinalysis and treat according to the test results as you will. Needs follow-up with outpatient neurology as previously planned in about 12 weeks to follow-up with the stroke NP at [facility]. Plan discussed with  Dr. Gennaro ______________________________________________________________________  Signed, Eligio Lav, MD [facility]

## 2024-04-11 NOTE — ED Notes (Signed)
 CCMD called, pt on monitor

## 2024-04-11 NOTE — ED Notes (Addendum)
[facility] called for transport to  

## 2024-04-11 NOTE — Procedures (Signed)
 Patient Name: Beth Macdonald  MRN: [id number]  Epilepsy Attending: Arlin [name]  Referring Physician/Provider: Voncile Isles, MD  Date: 04/11/2024 Duration: 25.26 mins  Patient history: 78 y.o. female due to loss of balance and confusion. EEG to evaluate for seizure   Level of alertness: Awake  AEDs during EEG study: None  Technical aspects: This EEG study was done with scalp electrodes positioned according to the 10-20 International system of electrode placement. Electrical activity was reviewed with band pass filter of 1-70Hz , sensitivity of 7 uV/mm, display speed of 72mm/sec with a 60Hz  notched filter applied as appropriate. EEG data were recorded continuously and digitally stored.  Video monitoring was available and reviewed as appropriate.  Description: The posterior dominant rhythm consists of 8 Hz activity of moderate voltage (25-35 uV) seen predominantly in posterior head regions, symmetric and reactive to eye opening and eye closing. EEG showed intermittent generalized 3 to 6 Hz theta-delta slowing. Hyperventilation and photic stimulation were not performed.     ABNORMALITY - Intermittent slow, generalized  IMPRESSION: This study is suggestive of mild diffuse encephalopathy. No seizures or epileptiform discharges were seen throughout the recording.  Doylene Splinter O Marchetta Navratil

## 2024-04-11 NOTE — ED Notes (Signed)
 OK to eat and drink per Dr. Gennaro. RN provided pt with sandwich bag and water.

## 2024-04-11 NOTE — Code Documentation (Signed)
 Stroke Response Nurse Documentation Code Documentation  Beth Macdonald is a 78 y.o. female arriving to [facility]  via [facility] on 04/11/2024 with past medical hx of Bells Palsy, CVA, TIA, HLD, diabetes. On aspirin  81 mg daily and clopidogrel  75 mg daily. Code stroke was activated by Urgent Care.   Patient from urgent care where she was LKW at 0900 and now complaining of confusion and right facial droop. She was at the [employer] this morning when she had sudden onset of leg weakness and confusion.   Stroke team at the bedside on patient arrival. Labs drawn and patient cleared for CT by Dr. Gennaro. Patient to CT with team. NIHSS 7, see documentation for details and code stroke times. Patient with disoriented, right facial droop, right leg weakness, Expressive aphasia , and dysarthria  on exam.   The following imaging was completed:  CT Head.   Patient is not a candidate for IV Thrombolytic due to recent stroke. Patient is not a candidate for IR.   Care Plan: q2h NIHSS and VS.   Process Delays Noted: n/a  Bedside handoff with ED RN Omar.    Jaggar Benko L Tori Cupps  Rapid Response RN

## 2024-04-11 NOTE — ED Provider Notes (Signed)
 [facility]   [id number] 04/11/24 Arrival Time: 1101  ASSESSMENT & PLAN:  1. Loss of balance   2. Acute confusion    LKW 9 am today.  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 DC records revived. Baseline R facial droop. With acute loss of balance event approximately 9 am today and her admitting she feels confused, code stroke called. To ED vial [facility]. Stable upon discharge.  Labs Reviewed  POCT FASTING [facility]G [facility] MANUAL ENTRY - Abnormal; Notable for the following components:      Result Value   POCT Glucose ([facility]) 177 (*)    All other components within normal limits    Reviewed expectations re: course of current medical issues. Questions answered. Outlined signs and symptoms indicating need for more acute intervention. Patient verbalized understanding. After Visit Summary given.   SUBJECTIVE:  Beth Macdonald is a 78 y.o. female who suffered nonhemorrhagic stroke by MRI on 03/14/24. Spouse reports [place] cane [place] when she noted her legs weakening; denies arm weakness. About to fall but someone caught her. Baseline facial droop from last stroke. Now she is telling me that I feel confused. If you give me an instruction, I'm not sure what I should be doing. Spouse reports she is now at her baseline otherwise. Denies missing any doses of ASA or Plavix .  Social History   Substance and Sexual Activity  Alcohol Use Yes   Comment: social/rarely   Social History   Tobacco Use  Smoking Status Former   Current packs/day: 0.00   Average packs/day: 1 pack/day for 20.0 years (20.0 ttl pk-yrs)   Types: Cigarettes   Start date: 66   Quit date: 51   Years since quitting: 45.6  Smokeless Tobacco Never     OBJECTIVE:  Vitals:   04/11/24 1116  BP: (!) 129/53  Pulse: 80  Resp: 20  Temp: 97.8 F (36.6 C)  TempSrc: Oral  SpO2: 97%    General appearance: alert; no distress; in wheelchair now Eyes: PERRLA; EOMI; conjunctiva  normal HENT: normocephalic; atraumatic; TMs normal; nasal mucosa normal; oral mucosa normal Neck: supple with FROM Lungs: clear to auscultation bilaterally Heart: regular Abdomen: soft, Extremities: no gross deformities Skin: warm and dry Neurologic: R facial droop; speaks clearly; mild weakness in RLE but reports this is baseline; normal extremity sensation and grip strength; she is able to answer my questions clearly but I still feel confused Psychological: alert and cooperative; normal mood and affect  Investigations:  Labs Reviewed  POCT FASTING CBG [facility] MANUAL ENTRY   No results found.  Allergies  Allergen Reactions   Clindamycin/Lincomycin Other (See Comments)    Cannot tolerate mycins causes C-Diff   Erythromycin     Other Reaction(s): Not available  erythromycin   Milk-Related Compounds Diarrhea   Tetracyclines & Related Other (See Comments)    Feel really nervous    Past Medical History:  Diagnosis Date   Aortic atherosclerosis (HCC)    Diabetes mellitus without complication (HCC)    Type 2   Diabetic neuropathy (HCC)    DVT of proximal lower limb (HCC)    Facial paralysis/Bells palsy    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HCC)    Hyperlipidemia    Mitral valve insufficiency    Osteoarthritis    SCCA (squamous cell carcinoma) of skin 03/22/2021   Bridge of nose (in situ)   Skull fracture (HCC)    age 46   Squamous cell carcinoma of skin 03/22/2021   Right forehead (  in situ)   TIA (transient ischemic attack) 2020   Varicose vein of leg    Social History   Socioeconomic History   Marital status: Married    Spouse name: Not on file   Number of children: 0   Years of education: Not on file   Highest education level: Not on file  Occupational History   Not on file  Tobacco Use   Smoking status: Former    Current packs/day: 0.00    Average packs/day: 1 pack/day for 20.0 years (20.0 ttl pk-yrs)    Types: Cigarettes    Start date: 85    Quit  date: 56    Years since quitting: 45.6   Smokeless tobacco: Never  Vaping Use   Vaping status: Never Used  Substance and Sexual Activity   Alcohol use: Yes    Comment: social/rarely   Drug use: Never   Sexual activity: Yes  Other Topics Concern   Not on file  Social History Narrative   Not on file   Social Drivers of Health   Financial Resource Strain: Not on file  Food Insecurity: No Food Insecurity (03/14/2024)   Hunger Vital Sign    Worried About Running Out of Food in the Last Year: Never true    Ran Out of Food in the Last Year: Never true  Transportation Needs: No Transportation Needs (03/14/2024)   PRAPARE - Administrator, Civil Service (Medical): No    Lack of Transportation (Non-Medical): No  Physical Activity: Not on file  Stress: Not on file  Social Connections: Moderately Isolated (03/14/2024)   Social Connection and Isolation Panel    Frequency of Communication with Friends and Family: More than three times a week    Frequency of Social Gatherings with Friends and Family: More than three times a week    Attends Religious Services: Never    Database administrator or Organizations: No    Attends Banker Meetings: Never    Marital Status: Married  Catering manager Violence: Not At Risk (03/14/2024)   Humiliation, Afraid, Rape, and Kick questionnaire    Fear of Current or Ex-Partner: No    Emotionally Abused: No    Physically Abused: No    Sexually Abused: No   Family History  Problem Relation Age of Onset   Stroke Mother    Hypertension Father    Dementia Father    Leukemia Brother    Stroke Brother    Colon cancer Neg Hx    Esophageal cancer Neg Hx    Pancreatic cancer Neg Hx    Stomach cancer Neg Hx    Past Surgical History:  Procedure Laterality Date   ELBOW SURGERY Bilateral    20 yrs ago   ENDOVENOUS ABLATION SAPHENOUS VEIN W/ LASER Bilateral    ENDOVENOUS ABLATION SAPHENOUS VEIN W/ LASER Left 11/19/2019   endovenous  laser ablation anterior accessory branch of left GSV and stab phlebectomy> 20 incisions left leg by Medford Blade MD    shoulder Bilateral    [phone]   TOENAIL EXCISION Right 2019   Big toe   TONSILLECTOMY Bilateral    TOTAL KNEE ARTHROPLASTY Right 01/01/2022   Procedure: TOTAL KNEE ARTHROPLASTY;  Surgeon: Melodi Lerner, MD;  Location: [facility];  Service: Orthopedics;  Laterality: Right;       Rolinda Rogue, MD 04/11/24 1131

## 2024-04-11 NOTE — H&P (Signed)
 History and Physical    Patient: Beth Macdonald FMW:[id number] DOB: 03-21-1946 DOA: 04/11/2024 DOS: the patient was seen and examined on 04/11/2024 . PCP: Clarice Nottingham, MD  [place] Chief complaint: Chief Complaint  Patient presents with   Code Stroke   HPI:  Beth Macdonald is a 78 y.o. female with past medical history  of  prior CVA, ambulatory dysfunction with abnormal balance, status post implanted loop recorder (5-second episode of VT detected that was 176 bpm 06/02/2023), hypertension, type 2 diabetes, hyperlipidemia, CKD 3A, history of facial paralysis/Bell's palsy,   Around 9 AM today patient was at the [employer] where she was very weak in all began to fall because of the same.  Patient initially went to the urgent care and was sent to the ED where code stroke was activated with last known well at 9 AM today.  Patient had a right facial droop from h/o bells palsy but has also had a recent discharge on July 13 for acute CVA involving the left frontal lobe secondary to Critical stenosis vs occlusion of the left P1, P2 segment of PCA, severe stenosis distal M1 segment of R MCA, moderate to severe stenosis MPA and M3 where patient was discharged on dual antiplatelets. Patient was also noted to be confused and not able to follow instructions on initial presentation.  In addition to confusion patient had right facial droop and leg weakness and expressive aphasia and dysarthria.    ED Course:  Vital signs in the ED were notable for the following:  Vitals:   04/11/24 1515 04/11/24 1600 04/11/24 1730 04/11/24 1815  BP: (!) 181/64 (!) 180/59 (!) 131/92 124/70  Pulse: 64 67 71 73  Temp:      Resp: 17 17 (!) 21 (!) 21  Height:      Weight:      SpO2: 100% 97% 99% 100%  TempSrc:      BMI (Calculated):      >>ED evaluation thus far shows: CMP shows bicarb 21 glucose 144 CKD stage IIIb with a creatinine of 1.42 GFR 37 normal LFTs. Normal CBC. Dimer  pending-if not normal will consider CTA of the chest to evaluate for VTE. EKG today shows sinus rhythm 73 PR of 171 left atrial enlargement QRS of 92, QTc of 422 nonspecific ST changes-J-point elevation.  >>While in the ED patient received the following: Medications  sodium chloride  flush (NS) 0.9 % injection 3 mL (3 mLs Intravenous Not Given 04/11/24 1201)   Review of Systems  Neurological:  Positive for weakness.  All other systems reviewed and are negative.  Past Medical History:  Diagnosis Date   Aortic atherosclerosis (HCC)    Diabetes mellitus without complication (HCC)    Type 2   Diabetic neuropathy (HCC)    DVT of proximal lower limb (HCC)    Facial paralysis/Bells palsy    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HCC)    Hyperlipidemia    Mitral valve insufficiency    Osteoarthritis    SCCA (squamous cell carcinoma) of skin 03/22/2021   Bridge of nose (in situ)   Skull fracture (HCC)    age 1   Squamous cell carcinoma of skin 03/22/2021   Right forehead (in situ)   TIA (transient ischemic attack) 2020   Varicose vein of leg    Past Surgical History:  Procedure Laterality Date   ELBOW SURGERY Bilateral    20 yrs ago   ENDOVENOUS ABLATION SAPHENOUS VEIN W/ LASER Bilateral  ENDOVENOUS ABLATION SAPHENOUS VEIN W/ LASER Left 11/19/2019   endovenous laser ablation anterior accessory branch of left GSV and stab phlebectomy> 20 incisions left leg by Medford Blade MD    shoulder Bilateral    [phone]   TOENAIL EXCISION Right 2019   Big toe   TONSILLECTOMY Bilateral    TOTAL KNEE ARTHROPLASTY Right 01/01/2022   Procedure: TOTAL KNEE ARTHROPLASTY;  Surgeon: Melodi Lerner, MD;  Location: [facility];  Service: Orthopedics;  Laterality: Right;    reports that she quit smoking about 45 years ago. Her smoking use included cigarettes. She started smoking about 65 years ago. She has a 20 pack-year smoking history. She has never used smokeless tobacco. She reports current alcohol use.  She reports that she does not use drugs. Allergies  Allergen Reactions   Clindamycin/Lincomycin Other (See Comments)    Cannot tolerate mycins causes C-Diff   Erythromycin     Other Reaction(s): Not available  erythromycin   Milk-Related Compounds Diarrhea   Tetracyclines & Related Other (See Comments)    Feel really nervous   Family History  Problem Relation Age of Onset   Stroke Mother    Hypertension Father    Dementia Father    Leukemia Brother    Stroke Brother    Colon cancer Neg Hx    Esophageal cancer Neg Hx    Pancreatic cancer Neg Hx    Stomach cancer Neg Hx    Prior to Admission medications   Medication Sig Start Date End Date Taking? Authorizing Provider  aspirin  EC 81 MG tablet Take 1 tablet (81 mg total) by mouth daily. Swallow whole. 03/15/24 06/13/24 Yes Christobal Guadalajara, MD  atorvastatin  (LIPITOR) 20 MG tablet Take 1 tablet (20 mg total) by mouth daily. Hold until you are taking Paxlovid  11/16/22  Yes Caleen Qualia, MD  Cholecalciferol  (VITAMIN D3) 50 MCG (2000 UT) capsule Take 2,000 Units by mouth daily.   Yes [provider]  clopidogrel  (PLAVIX ) 75 MG tablet Take 1 tablet (75 mg total) by mouth daily. 03/16/24 05/15/24 Yes Christobal Guadalajara, MD  cycloSPORINE  (RESTASIS ) 0.05 % ophthalmic emulsion Place 1 drop into both eyes 2 (two) times daily as needed.   Yes [provider]  desonide  (DESOWEN ) 0.05 % cream Apply topically 2 (two) times daily as needed (Rash). 03/22/21  Yes Sheffield, Kelli R, PA-C  donepezil  (ARICEPT ) 5 MG tablet Take 5 mg by mouth at bedtime. 03/13/24  Yes [provider]  lisinopril  (ZESTRIL ) 10 MG tablet Take 10 mg by mouth daily. 12/30/20  Yes [provider]  metFORMIN  (GLUCOPHAGE -XR) 500 MG 24 hr tablet Take 1,000 mg by mouth 2 (two) times daily with a meal.   Yes [provider]  Polyethyl Glycol-Propyl Glycol (SYSTANE FREE OP) Apply 1 drop to eye daily as needed (dryness/irritation).   Yes [provider]  REPATHA  SURECLICK 140 MG/ML SOAJ Inject 140 mg into the skin every 14 (fourteen) days. Sunday 10/09/21  Yes [provider]  Semaglutide ,0.25 or 0.5MG /DOS, 2 MG/3ML SOPN Inject 0.5 mg into the skin once a week.   Yes [provider]  Clobetasol  Prop Emollient Base (CLOBETASOL  PROPIONATE E) 0.05 % emollient cream Apply 1 Application topically 2 (two) times daily. Patient taking differently: Apply 1 Application topically 2 (two) times daily as needed (for rash). 04/18/22   Sheffield, Kelli R, PA-C  Vitals:   04/11/24 1515 04/11/24 1600 04/11/24 1730 04/11/24 1815  BP: (!) 181/64 (!) 180/59 (!) 131/92 124/70  Pulse: 64 67 71 73  Resp: 17 17 (!) 21 (!) 21  Temp:      TempSrc:      SpO2: 100% 97% 99% 100%  Weight:      Height:       Physical Exam Vitals reviewed.  Constitutional:      General: She is not in acute distress.    Appearance: She is not ill-appearing.  HENT:     Head: Normocephalic and atraumatic.     Right Ear: External ear normal.     Left Ear: External ear normal.  Eyes:     Extraocular Movements: Extraocular movements intact.  Cardiovascular:     Rate and Rhythm: Normal rate and regular rhythm.     Heart sounds: Normal heart sounds.  Pulmonary:     Breath sounds: Normal breath sounds.  Abdominal:     General: There is no distension.     Palpations: Abdomen is soft.     Tenderness: There is no abdominal tenderness.  Musculoskeletal:     Right lower leg: No edema.     Left lower leg: No edema.  Neurological:     General: No focal deficit present.     Mental Status: She is alert and oriented to person, place, and time.     Cranial Nerves: Facial asymmetry present. No dysarthria.     Motor: Weakness present.     Coordination: Coordination normal. Finger-Nose-Finger Test and Heel to [facility] Test normal.     Deep Tendon Reflexes: Reflexes normal.     Reflex  Scores:      Bicep reflexes are 2+ on the right side and 2+ on the left side.      Patellar reflexes are 2+ on the right side and 2+ on the left side. Psychiatric:        Attention and Perception: Attention normal.        Mood and Affect: Mood normal.        Speech: Speech normal.        Behavior: Behavior normal. Behavior is cooperative.     Labs on Admission: I have personally reviewed following labs and imaging studies CBC: Recent Labs  Lab 04/11/24 1148 04/11/24 1154  WBC 9.5  --   NEUTROABS 7.5  --   HGB 13.6 14.3  HCT 41.7 42.0  MCV 94.6  --   PLT 247  --    Basic Metabolic Panel: Recent Labs  Lab 04/11/24 1148 04/11/24 1154  NA 139 139  K 4.2 4.2  CL 103 104  CO2 21*  --   GLUCOSE 144* 143*  BUN 26* 28*  CREATININE 1.42* 1.50*  CALCIUM  9.8  --   MG 1.2*  --    GFR: Estimated Creatinine Clearance: 30.2 mL/min (A) (by C-G formula based on SCr of 1.5 mg/dL (H)). Liver Function Tests: Recent Labs  Lab 04/11/24 1148  AST 25  ALT 18  ALKPHOS 33*  BILITOT 0.6  PROT 7.2  ALBUMIN  3.8   No results for input(s): LIPASE, AMYLASE in the last 168 hours. No results for input(s): AMMONIA in the last 168 hours. Recent Labs    03/14/24 1239 03/14/24 2136 03/15/24 0632 04/11/24 1148 04/11/24 1154  BUN 26*  --  22 26* 28*  CREATININE 1.65* 1.37* 1.29* 1.42* 1.50*    Estimated Creatinine Clearance: 30.2 mL/min (A) (by C-G formula  based on SCr of 1.5 mg/dL (H)).   Recent Labs    03/14/24 1239 03/14/24 2136 03/15/24 0632 04/11/24 1148 04/11/24 1154  BUN 26*  --  22 26* 28*  CREATININE 1.65* 1.37* 1.29* 1.42* 1.50*  CO2 25  --  21* 21*  --    Cardiac Enzymes: No results for input(s): CKTOTAL, CKMB, CKMBINDEX, TROPONINI in the last 168 hours. BNP (last 3 results) No results for input(s): PROBNP in the last 8760 hours. HbA1C: No results for input(s): HGBA1C in the last 72 hours. CBG: Recent Labs  Lab 04/11/24 1147 04/11/24 1900   GLUCAP 140* 131*   Lipid Profile: Recent Labs    04/11/24 1148  CHOL 120  HDL 57  LDLCALC 30  TRIG 164*  CHOLHDL 2.1   Thyroid  Function Tests: No results for input(s): TSH, T4TOTAL, FREET4, T3FREE, THYROIDAB in the last 72 hours. Anemia Panel: No results for input(s): VITAMINB12, FOLATE, FERRITIN, TIBC, IRON, RETICCTPCT in the last 72 hours. Urine analysis:    Component Value Date/Time   COLORURINE YELLOW 04/11/2024 1317   APPEARANCEUR CLEAR 04/11/2024 1317   LABSPEC 1.018 04/11/2024 1317   PHURINE 5.0 04/11/2024 1317   GLUCOSEU NEGATIVE 04/11/2024 1317   HGBUR NEGATIVE 04/11/2024 1317   BILIRUBINUR NEGATIVE 04/11/2024 1317   KETONESUR NEGATIVE 04/11/2024 1317   PROTEINUR NEGATIVE 04/11/2024 1317   NITRITE NEGATIVE 04/11/2024 1317   LEUKOCYTESUR NEGATIVE 04/11/2024 1317   Radiological Exams on Admission: MR BRAIN WO CONTRAST Result Date: 04/11/2024 EXAM: MRI BRAIN WITHOUT CONTRAST 04/11/2024 01:48:56 PM TECHNIQUE: Multiplanar multisequence MRI of the head/brain was performed without the administration of intravenous contrast. COMPARISON: None available. CLINICAL HISTORY: Neuro deficit, acute, stroke suspected. FINDINGS: BRAIN AND VENTRICLES: There is a small focus of restrictive diffusion within the left caudate concerning for focus of acute infarct. Within this region there is signal abnormality and susceptibility concerning for associated hemorrhage. No significant surrounding edema. No mass effect. Redemonstrated remote infarcts in the left frontal lobe, right occipital cortex, and right parietal lobe. Additional remote infarct in the right frontal lobe again noted. Generalized parenchymal volume loss. There are nonspecific hyperintense foci in the subcortical and periventricular white matter that most likely represent chronic microangiopathic ischemic changes in a patient of this age. Chronic microhemorrhage in the left frontal lobe. ORBITS: Bilateral lens  replacement. SINUSES AND MASTOIDS: Mucous retention cyst in the right maxillary sinus. BONES AND SOFT TISSUES: Normal marrow signal. No acute soft tissue abnormality. LIMITATIONS/ARTIFACTS: Examination is slightly limited due to motion artifact. IMPRESSION: 1. Acute versus subacute infarct in the left caudate with mild associated hemorrhage. No mass effect or midline shift. Recommend short interval follow up head CT. 2. Remote infarcts, chronic microvascular ischemic changes, and parenchymal volume loss. 3. Findings messaged to Dr. Arora via the [facility] messaging system at 3:59PM on 04/11/24. Electronically signed by: Donnice Mania MD 04/11/2024 04:00 PM EDT RP Workstation: [id number]   EEG adult Result Date: 04/11/2024 Shelton Arlin KIDD, MD     04/11/2024  1:22 PM Patient Name: Tawonna Esquer Rickles MRN: [id number] Epilepsy Attending: Arlin KIDD Shelton Referring Physician/Provider: Voncile Isles, MD Date: 04/11/2024 Duration: 25.26 mins Patient history: 78 y.o. female due to loss of balance and confusion. EEG to evaluate for seizure Level of alertness: Awake AEDs during EEG study: None Technical aspects: This EEG study was done with scalp electrodes positioned according to the 10-20 International system of electrode placement. Electrical activity was reviewed with band pass filter of 1-70Hz , sensitivity of 7 uV/mm, display speed of  34mm/sec with a 60Hz  notched filter applied as appropriate. EEG data were recorded continuously and digitally stored.  Video monitoring was available and reviewed as appropriate. Description: The posterior dominant rhythm consists of 8 Hz activity of moderate voltage (25-35 uV) seen predominantly in posterior head regions, symmetric and reactive to eye opening and eye closing. EEG showed intermittent generalized 3 to 6 Hz theta-delta slowing. Hyperventilation and photic stimulation were not performed.   ABNORMALITY - Intermittent slow, generalized IMPRESSION: This study is suggestive of mild diffuse  encephalopathy. No seizures or epileptiform discharges were seen throughout the recording. Arlin [name]   CT HEAD CODE STROKE WO CONTRAST Result Date: 04/11/2024 EXAM: CT HEAD WITHOUT 04/11/2024 12:01:30 PM TECHNIQUE: CT of the head was performed without the administration of intravenous contrast. Automated exposure control, iterative reconstruction, and/or weight based adjustment of the mA/kV was utilized to reduce the radiation dose to as low as reasonably achievable. COMPARISON: MRI head dated 03/14/2024. CLINICAL HISTORY: Neuro deficit, acute, stroke suspected. Non con. Code stroke. Words finding difficulty; hx prior stroke. FINDINGS: BRAIN AND VENTRICLES: No acute intracranial hemorrhage. No mass effect or midline shift. No extra-axial fluid collection. Gray-white differentiation is maintained. No hydrocephalus. Redemonstrated encephalomalacia in the left frontal lobe compatible with remote infarct. Additional remote cortical infarct in the right occipital lobe and in the high right parietal lobe near the vertex. Nonspecific hypoattenuation in the periventricular and subcortical white matter, most likely representing chronic small vessel disease. Generalized parenchymal volume loss. ORBITS: No acute abnormality. SINUSES AND MASTOIDS: Mucous retention cyst in the right maxillary sinus. SOFT TISSUES AND SKULL: No acute skull fracture. No acute soft tissue abnormality. Bilateral lens replacement. [place] stroke program early CT (ASPECT) score: Ganglionic (caudate, IC, lentiform nucleus, insula, M1-M3): 7 Supraganglionic (M4-M6): 3 Total: 10 IMPRESSION: 1. No acute intracranial abnormality. 2. Remote infarcts as above. 3. Chronic small vessel disease and generalized parenchymal volume loss. 4. Findings messaged to Dr. Arora via the [facility] messaging system at 12:13PM on 04/11/24. Electronically signed by: Donnice Mania MD 04/11/2024 12:15 PM EDT RP Workstation: [id number]   Data Reviewed: Relevant notes from  primary care and specialist visits, past discharge summaries as available in EHR, including Care Everywhere . Prior diagnostic testing as pertinent to current admission diagnoses, Updated medications and problem lists for reconciliation .ED course, including vitals, labs, imaging, treatment and response to treatment,Triage notes, nursing and pharmacy notes and ED provider's notes.Notable results as noted in HPI.Discussed case with EDMD/ ED APP/ or Specialty MD on call and as needed.  Assessment & Plan  >> Leg weakness/acute CVA: Will admit to med telemetry unit with continuous cardiac monitoring.  Negative EEG.  MRI showing acute versus subacute, which I suspect may be acute with her new clinical presentation of leg weakness along with remote infarcts and chronic small vessel disease.  Antiplatelet therapy recommendation per neurology along with statin therapy.  Permissive hypertension. Aspiration fall precautions. Speech therapy physical therapy Occupational Therapy and bedside swallow evaluation. Additional neuroimaging per neurology-stroke team.  With her recent discharge patient has already had a 2D echocardiogram no need for repeat currently. Per neurology recommendations patient is to have a head CT tomorrow at 6 in the morning and stroke team to follow. D-dimer is ordered and pending and will consider CT chest evaluation.  >> Essential hypertension: Will allow room for permissive hypertension.  Will treat systolic blood pressure greater than 220 and diastolic greater than 120.   >> Diabetes mellitus type 2: Glycemic protocol carb consistent diet  if patient passes bedside swallow.    DVT prophylaxis:  SCDs Consults:  Neurology  Advance Care Planning:    Code Status: Full Code   Family Communication:  Spouse Disposition Plan:  To determined Severity of Illness: The appropriate patient status for this patient is OBSERVATION. Observation status is judged to be reasonable and  necessary in order to provide the required intensity of service to ensure the patient's safety. The patient's presenting symptoms, physical exam findings, and initial radiographic and laboratory data in the context of their medical condition is felt to place them at decreased risk for further clinical deterioration. Furthermore, it is anticipated that the patient will be medically stable for discharge from the hospital within 2 midnights of admission.   Unresulted Labs (From admission, onward)     Start     Ordered   04/11/24 1935  D-dimer, quantitative  Add-on,   AD        04/11/24 1934            Meds ordered this encounter  Medications   sodium chloride  flush (NS) 0.9 % injection 3 mL   cyanocobalamin  (VITAMIN B12) injection 1,000 mcg    stroke: early stages of recovery book   0.9 %  sodium chloride  infusion   OR Linked Order Group    acetaminophen  (TYLENOL ) tablet 650 mg    acetaminophen  (TYLENOL ) 160 MG/5ML solution 650 mg    acetaminophen  (TYLENOL ) suppository 650 mg   insulin  aspart (novoLOG ) injection 0-15 Units    Correction coverage::   Moderate (average weight, post-op)    CBG < 70::   implement hypoglycemia protocol    CBG 70 - 120::   0 units    CBG 121 - 150::   2 units    CBG 151 - 200::   3 units    CBG 201 - 250::   5 units    CBG 251 - 300::   8 units    CBG 301 - 350::   11 units    CBG 351 - 400::   15 units    CBG > 400:   call MD and obtain STAT lab verification   hydrALAZINE  (APRESOLINE ) injection 10 mg     Orders Placed This Encounter  Procedures   CT HEAD CODE STROKE WO CONTRAST   MR BRAIN WO CONTRAST   CT HEAD POST STROKE FOLLOWUP/TIMED/STAT READ   CT HEAD WO CONTRAST ( )   Protime-INR   APTT   CBC   Differential   Comprehensive metabolic panel   Ethanol   Urinalysis, Routine w reflex microscopic -Urine, Clean Catch   Magnesium    Lipid panel   Glucose, capillary   D-dimer, quantitative   Diet Carb Modified Fluid consistency: Thin;  Room service appropriate? Yes   NIH Stroke Scale   Saline Lock IV, Maintain IV access   If O2 sat <94% Administer O2 @ 2 Liters/Minute   NIHSS score documentation NIHSS score range: 0-42   Vital signs   Notify physician (specify)   OOB with assistance   Activity as tolerated   Swallow screen - If patient does NOT pass this screen, place order for SLP eval and treat (SLP2) - swallowing evaluation (BSE, MBS and/or diet order as indicated)   NIH Stroke Scale   Intake and output   Cardiac Monitoring Continuous x 24 hours Indications for use: Acute neurological event   Apply Stroke Care Plan: Ischemic Stroke, TIA   Initiate Adult Central Line Maintenance and Catheter Protocol  for patients with central line (CVC, PICC, Port, Hemodialysis, Trialysis)   Discuss with patient and document patient's goals for stroke risk factor reduction   Initiate Oral Care Protocol   Initiate Carrier Fluid Protocol   Provide stroke education material to patient and family.   Nurse to provide smoking / tobacco cessation education   If the patient has passed the Stroke Swallow Screen or has a feeding tube, then RN may order General Admission PRN Orders (through manage orders) for the following patient needs: allergy symptoms (Claritin), cold sores (Carmex), cough (Robitussin DM), eye irritation (Liquifilm Tears), hemorrhoids (Tucks), indigestion (Maalox), minor skin irritation (hydrocortisone  cream), muscle pain Lucienne Gay), nose irritation (saline nasal spray) and sore throat (Chloraseptic spray).   Apply Diabetes Mellitus Care Plan   STAT CBG when hypoglycemia is suspected. If treated, recheck every 15 minutes after each treatment until CBG >/= 70 mg/dl   Refer to Hypoglycemia Protocol Sidebar Report for treatment of CBG < 70 mg/dl   No HS correction Insulin    Call the neuro / stroke provider once timed CT imaging is completed   Full code   Consult to hospitalist   Consult to Registered Dietitian   Consult to  Transition of Care Team   OT eval and treat   PT eval and treat   ED Pulse oximetry, continuous   Oxygen therapy Mode or (Route): Nasal cannula; Liters Per Minute: 2; Keep O2 saturation between: greater than 94 %   SLP eval and treat Reason for evaluation: Cognitive/Language evaluation   I-stat chem 8, ED   CBG monitoring, ED   I-Stat CG4 Lactic Acid   EKG 12-Lead   EEG adult   Place in observation (patient's expected length of stay will be less than 2 midnights)   Fall precautions    Author: Mario LULLA Blanch, MD 12 pm- 8 pm. [facility]. 04/11/2024 7:40 PM Please note for any communication after hours contact [facility] Assigned provider on call on Amion.

## 2024-04-11 NOTE — ED Notes (Signed)
CBG 140  

## 2024-04-11 NOTE — ED Provider Notes (Signed)
 [place] EMERGENCY DEPARTMENT AT [facility] Provider Note   CSN: [id number] Arrival date & time: 04/11/24  1146  An emergency department physician performed an initial assessment on this suspected stroke patient at 1147.  Patient presents with: Code Stroke   Beth Macdonald is a 78 y.o. female.   78 year old female presents from urgent care as a stroke alert.  [place] developed a facial droop.  Also had some acute confusion.  Patient has a history of stroke in the past with deficits on the same side.  She does seem somewhat confused, but has some waxing and waning orientation but is alert and talkative.  Neuro at bedside on patient's arrival.        Prior to Admission medications   Medication Sig Start Date End Date Taking? Authorizing Provider  aspirin  EC 81 MG tablet Take 1 tablet (81 mg total) by mouth daily. Swallow whole. 03/15/24 06/13/24 Yes Christobal Guadalajara, MD  atorvastatin  (LIPITOR) 20 MG tablet Take 1 tablet (20 mg total) by mouth daily. Hold until you are taking Paxlovid  11/16/22  Yes Caleen Qualia, MD  Cholecalciferol  (VITAMIN D3) 50 MCG (2000 UT) capsule Take 2,000 Units by mouth daily.   Yes [provider]  clopidogrel  (PLAVIX ) 75 MG tablet Take 1 tablet (75 mg total) by mouth daily. 03/16/24 05/15/24 Yes Christobal Guadalajara, MD  cycloSPORINE  (RESTASIS ) 0.05 % ophthalmic emulsion Place 1 drop into both eyes 2 (two) times daily as needed.   Yes [provider]  desonide  (DESOWEN ) 0.05 % cream Apply topically 2 (two) times daily as needed (Rash). 03/22/21  Yes Sheffield, Kelli R, PA-C  donepezil  (ARICEPT ) 5 MG tablet Take 5 mg by mouth at bedtime. 03/13/24  Yes [provider]  lisinopril  (ZESTRIL ) 10 MG tablet Take 10 mg by mouth daily. 12/30/20  Yes [provider]  metFORMIN  (GLUCOPHAGE -XR) 500 MG 24 hr tablet Take 1,000 mg by mouth 2 (two) times daily with a meal.   Yes [provider]  Polyethyl Glycol-Propyl Glycol (SYSTANE FREE OP) Apply 1 drop to eye daily as needed (dryness/irritation).   Yes [provider]  REPATHA  SURECLICK 140 MG/ML SOAJ Inject 140 mg into the skin every 14 (fourteen) days. Sunday 10/09/21  Yes [provider]  Semaglutide ,0.25 or 0.5MG /DOS, 2 MG/3ML SOPN Inject 0.5 mg into the skin once a week.   Yes [provider]  Clobetasol  Prop Emollient Base (CLOBETASOL  PROPIONATE E) 0.05 % emollient cream Apply 1 Application topically 2 (two) times daily. Patient taking differently: Apply 1 Application topically 2 (two) times daily as needed (for rash). 04/18/22   Sheffield, Kelli R, PA-C    Allergies: Clindamycin/lincomycin, Erythromycin, Milk-related compounds, and Tetracyclines & related    Review of Systems  Constitutional:  Negative for chills and fever.  HENT:  Negative for ear pain and sore throat.   Eyes:  Negative for pain and visual disturbance.  Respiratory:  Negative for cough and shortness of breath.   Cardiovascular:  Negative for chest pain and palpitations.  Gastrointestinal:  Negative for abdominal pain and vomiting.  Genitourinary:  Negative for dysuria and hematuria.  Musculoskeletal:  Negative for arthralgias and back pain.  Skin:  Negative for color change and rash.  Neurological:  Negative for seizures and syncope.  All other systems reviewed and are negative.   Updated Vital Signs BP (!) 176/68   Pulse 64   Temp 97.6 F (36.4 C) (Oral)  Resp 18   Ht 5' 8 (1.727 m)   Wt 75.6 kg   SpO2 100%   BMI 25.34 kg/m   Physical Exam Vitals and nursing note reviewed.  Constitutional:      General: She is not in acute distress.    Appearance: Normal appearance. She is well-developed. She is not ill-appearing.     Comments: Alert and oriented x 2-3 but answers questions appropriately  HENT:     Head: Normocephalic and atraumatic.  Eyes:     Conjunctiva/sclera: Conjunctivae normal.   Cardiovascular:     Rate and Rhythm: Normal rate and regular rhythm.     Heart sounds: No murmur heard. Pulmonary:     Effort: Pulmonary effort is normal. No respiratory distress.     Breath sounds: Normal breath sounds.  Abdominal:     Palpations: Abdomen is soft.     Tenderness: There is no abdominal tenderness.  Musculoskeletal:        General: No swelling.     Cervical back: Neck supple.  Skin:    General: Skin is warm and dry.     Capillary Refill: Capillary refill takes less than 2 seconds.  Neurological:     Mental Status: She is alert.     Comments: Facial droop that is patient's baseline from previous stroke/Bell's palsy, no focal weakness  Psychiatric:        Mood and Affect: Mood normal.     (all labs ordered are listed, but only abnormal results are displayed) Labs Reviewed  COMPREHENSIVE METABOLIC PANEL WITH GFR - Abnormal; Notable for the following components:      Result Value   CO2 21 (*)    Glucose, Bld 144 (*)    BUN 26 (*)    Creatinine, Ser 1.42 (*)    Alkaline Phosphatase 33 (*)    GFR, Estimated 37 (*)    All other components within normal limits  I-STAT CHEM 8, ED - Abnormal; Notable for the following components:   BUN 28 (*)    Creatinine, Ser 1.50 (*)    Glucose, Bld 143 (*)    All other components within normal limits  CBG MONITORING, ED - Abnormal; Notable for the following components:   Glucose-Capillary 140 (*)    All other components within normal limits  PROTIME-INR  APTT  CBC  DIFFERENTIAL  ETHANOL  URINALYSIS, ROUTINE W REFLEX MICROSCOPIC    EKG: EKG Interpretation Date/Time:  Saturday April 11 2024 11:59:15 EDT Ventricular Rate:  73 PR Interval:  171 QRS Duration:  92 QT Interval:  383 QTC Calculation: 422 R Axis:   67  Text Interpretation: Sinus rhythm Consider left atrial enlargement Compared with prior EKG from 03/14/2024 Confirmed by Gennaro Bouchard ([id number]) on 04/11/2024 12:21:00 PM  Radiology: EEG adult Result  Date: 04/11/2024 Shelton Arlin KIDD, MD     04/11/2024  1:22 PM Patient Name: Beth Macdonald MRN: [id number] Epilepsy Attending: Arlin KIDD Shelton Referring Physician/Provider: Voncile Isles, MD Date: 04/11/2024 Duration: 25.26 mins Patient history: 78 y.o. female due to loss of balance and confusion. EEG to evaluate for seizure Level of alertness: Awake AEDs during EEG study: None Technical aspects: This EEG study was done with scalp electrodes positioned according to the 10-20 International system of electrode placement. Electrical activity was reviewed with band pass filter of 1-70Hz , sensitivity of 7 uV/mm, display speed of 3mm/sec with a 60Hz  notched filter applied as appropriate. EEG data were recorded continuously and digitally stored.  Video monitoring was available  and reviewed as appropriate. Description: The posterior dominant rhythm consists of 8 Hz activity of moderate voltage (25-35 uV) seen predominantly in posterior head regions, symmetric and reactive to eye opening and eye closing. EEG showed intermittent generalized 3 to 6 Hz theta-delta slowing. Hyperventilation and photic stimulation were not performed.   ABNORMALITY - Intermittent slow, generalized IMPRESSION: This study is suggestive of mild diffuse encephalopathy. No seizures or epileptiform discharges were seen throughout the recording. Arlin [name]   CT HEAD CODE STROKE WO CONTRAST Result Date: 04/11/2024 EXAM: CT HEAD WITHOUT 04/11/2024 12:01:30 PM TECHNIQUE: CT of the head was performed without the administration of intravenous contrast. Automated exposure control, iterative reconstruction, and/or weight based adjustment of the mA/kV was utilized to reduce the radiation dose to as low as reasonably achievable. COMPARISON: MRI head dated 03/14/2024. CLINICAL HISTORY: Neuro deficit, acute, stroke suspected. Non con. Code stroke. Words finding difficulty; hx prior stroke. FINDINGS: BRAIN AND VENTRICLES: No acute intracranial hemorrhage. No  mass effect or midline shift. No extra-axial fluid collection. Gray-white differentiation is maintained. No hydrocephalus. Redemonstrated encephalomalacia in the left frontal lobe compatible with remote infarct. Additional remote cortical infarct in the right occipital lobe and in the high right parietal lobe near the vertex. Nonspecific hypoattenuation in the periventricular and subcortical white matter, most likely representing chronic small vessel disease. Generalized parenchymal volume loss. ORBITS: No acute abnormality. SINUSES AND MASTOIDS: Mucous retention cyst in the right maxillary sinus. SOFT TISSUES AND SKULL: No acute skull fracture. No acute soft tissue abnormality. Bilateral lens replacement. [place] stroke program early CT (ASPECT) score: Ganglionic (caudate, IC, lentiform nucleus, insula, M1-M3): 7 Supraganglionic (M4-M6): 3 Total: 10 IMPRESSION: 1. No acute intracranial abnormality. 2. Remote infarcts as above. 3. Chronic small vessel disease and generalized parenchymal volume loss. 4. Findings messaged to Dr. Arora via the [facility] messaging system at 12:13PM on 04/11/24. Electronically signed by: Donnice Mania MD 04/11/2024 12:15 PM EDT RP Workstation: [id number]     Procedures   Medications Ordered in the ED  sodium chloride  flush (NS) 0.9 % injection 3 mL (3 mLs Intravenous Not Given 04/11/24 1201)                                    Medical Decision Making Patient arrived to the ER as a stroke alert.  Has some facial droop and confusion but this seems to be her baseline.  She has a history of stroke.  CT head unremarkable.  Neurology consult as below and MRI pending at this time.  EEG showed some encephalopathy but no other acute abnormality.  Labs reviewed and unremarkable.  Patient seems to be improving, is asking for food and back to her baseline at this time. Patient signed out to oncoming provider at 3:30 PM pending remainder of workup and ultimate disposition.   Problems  Addressed: Confusion: acute illness or injury History of CVA (cerebrovascular accident): chronic illness or injury  Amount and/or Complexity of Data Reviewed Independent Historian: EMS    Details: EMS help provide history.  State patient was sent from urgent care for episode of confusion and possible facial droop that started about 2 hours prior to arrival External Data Reviewed: notes.    Details: Prior records reviewed and patient does have a history of CVA in the past with some deficits at baseline Labs: ordered. Decision-making details documented in ED Course.    Details: Ordered and reviewed by me and  unremarkable Radiology: ordered and independent interpretation performed. Decision-making details documented in ED Course.    Details: Ordered and reviewed by me and discussed with neurology CT head and CTA without any acute abnormality MRI pending at this time ECG/medicine tests: ordered and independent interpretation performed. Decision-making details documented in ED Course.    Details: Ordered and interpreted me in the absence of cardiology and shows sinus rhythm, no STEMI or acute abnormality when compared to previous Discussion of management or test interpretation with external provider(s): Dr. Voncile -neurology-I spoke with him regarding the patient and he evaluated her at bedside.  Recommended EEG and MRI and if these are unremarkable she can go home.  If there is any abnormality she can be admitted to them for further workup and management  Risk OTC drugs. Prescription drug management. Drug therapy requiring intensive monitoring for toxicity.     Final diagnoses:  Confusion  History of CVA (cerebrovascular accident)    ED Discharge Orders     None          Gennaro Duwaine CROME, DO 04/11/24 1520

## 2024-04-11 NOTE — Progress Notes (Addendum)
 Acute versus subacute infarct in the left caudate with mild associated hemorrhage.  No mass effect or midline shift.  Impression: Acute ischemic stroke with hemorrhagic transformation  Recommend CT at 0600 hrs. tomorrow.  No antiplatelets or anticoagulants.  No need to repeat full stroke workup Therapy assessments  Stroke team to follow  Plan relayed to Dr. Tobie

## 2024-04-12 ENCOUNTER — Observation Stay (HOSPITAL_COMMUNITY): Payer: MEDICARE

## 2024-04-12 DIAGNOSIS — I69391 Dysphagia following cerebral infarction: Secondary | ICD-10-CM

## 2024-04-12 DIAGNOSIS — I6389 Other cerebral infarction: Secondary | ICD-10-CM | POA: Diagnosis present

## 2024-04-12 DIAGNOSIS — Z96651 Presence of right artificial knee joint: Secondary | ICD-10-CM | POA: Diagnosis present

## 2024-04-12 DIAGNOSIS — R4701 Aphasia: Secondary | ICD-10-CM | POA: Diagnosis present

## 2024-04-12 DIAGNOSIS — I6523 Occlusion and stenosis of bilateral carotid arteries: Secondary | ICD-10-CM | POA: Diagnosis not present

## 2024-04-12 DIAGNOSIS — I129 Hypertensive chronic kidney disease with stage 1 through stage 4 chronic kidney disease, or unspecified chronic kidney disease: Secondary | ICD-10-CM | POA: Diagnosis present

## 2024-04-12 DIAGNOSIS — E1122 Type 2 diabetes mellitus with diabetic chronic kidney disease: Secondary | ICD-10-CM | POA: Diagnosis present

## 2024-04-12 DIAGNOSIS — R1310 Dysphagia, unspecified: Secondary | ICD-10-CM | POA: Diagnosis present

## 2024-04-12 DIAGNOSIS — G510 Bell's palsy: Secondary | ICD-10-CM | POA: Diagnosis present

## 2024-04-12 DIAGNOSIS — Z7984 Long term (current) use of oral hypoglycemic drugs: Secondary | ICD-10-CM | POA: Diagnosis not present

## 2024-04-12 DIAGNOSIS — I618 Other nontraumatic intracerebral hemorrhage: Secondary | ICD-10-CM | POA: Diagnosis present

## 2024-04-12 DIAGNOSIS — Z85828 Personal history of other malignant neoplasm of skin: Secondary | ICD-10-CM | POA: Diagnosis not present

## 2024-04-12 DIAGNOSIS — I7090 Unspecified atherosclerosis: Secondary | ICD-10-CM

## 2024-04-12 DIAGNOSIS — I639 Cerebral infarction, unspecified: Secondary | ICD-10-CM | POA: Diagnosis present

## 2024-04-12 DIAGNOSIS — E1151 Type 2 diabetes mellitus with diabetic peripheral angiopathy without gangrene: Secondary | ICD-10-CM

## 2024-04-12 DIAGNOSIS — Z7982 Long term (current) use of aspirin: Secondary | ICD-10-CM | POA: Diagnosis not present

## 2024-04-12 DIAGNOSIS — R29818 Other symptoms and signs involving the nervous system: Secondary | ICD-10-CM | POA: Diagnosis not present

## 2024-04-12 DIAGNOSIS — Z7985 Long-term (current) use of injectable non-insulin antidiabetic drugs: Secondary | ICD-10-CM | POA: Diagnosis not present

## 2024-04-12 DIAGNOSIS — N1832 Chronic kidney disease, stage 3b: Secondary | ICD-10-CM | POA: Diagnosis present

## 2024-04-12 DIAGNOSIS — I69354 Hemiplegia and hemiparesis following cerebral infarction affecting left non-dominant side: Secondary | ICD-10-CM | POA: Diagnosis not present

## 2024-04-12 DIAGNOSIS — I629 Nontraumatic intracranial hemorrhage, unspecified: Secondary | ICD-10-CM | POA: Diagnosis not present

## 2024-04-12 DIAGNOSIS — Z8249 Family history of ischemic heart disease and other diseases of the circulatory system: Secondary | ICD-10-CM | POA: Diagnosis not present

## 2024-04-12 DIAGNOSIS — R7989 Other specified abnormal findings of blood chemistry: Secondary | ICD-10-CM | POA: Diagnosis not present

## 2024-04-12 DIAGNOSIS — R29703 NIHSS score 3: Secondary | ICD-10-CM | POA: Diagnosis not present

## 2024-04-12 DIAGNOSIS — K219 Gastro-esophageal reflux disease without esophagitis: Secondary | ICD-10-CM | POA: Diagnosis present

## 2024-04-12 DIAGNOSIS — Z87891 Personal history of nicotine dependence: Secondary | ICD-10-CM | POA: Diagnosis not present

## 2024-04-12 DIAGNOSIS — Z79899 Other long term (current) drug therapy: Secondary | ICD-10-CM | POA: Diagnosis not present

## 2024-04-12 DIAGNOSIS — I340 Nonrheumatic mitral (valve) insufficiency: Secondary | ICD-10-CM | POA: Diagnosis present

## 2024-04-12 DIAGNOSIS — Z0389 Encounter for observation for other suspected diseases and conditions ruled out: Secondary | ICD-10-CM | POA: Diagnosis not present

## 2024-04-12 DIAGNOSIS — E785 Hyperlipidemia, unspecified: Secondary | ICD-10-CM | POA: Diagnosis present

## 2024-04-12 DIAGNOSIS — E1140 Type 2 diabetes mellitus with diabetic neuropathy, unspecified: Secondary | ICD-10-CM | POA: Diagnosis present

## 2024-04-12 DIAGNOSIS — G9349 Other encephalopathy: Secondary | ICD-10-CM | POA: Diagnosis present

## 2024-04-12 DIAGNOSIS — F0150 Vascular dementia without behavioral disturbance: Secondary | ICD-10-CM | POA: Diagnosis present

## 2024-04-12 LAB — GLUCOSE, CAPILLARY
Glucose-Capillary: 117 mg/dL — ABNORMAL HIGH (ref 70–99)
Glucose-Capillary: 137 mg/dL — ABNORMAL HIGH (ref 70–99)
Glucose-Capillary: 137 mg/dL — ABNORMAL HIGH (ref 70–99)
Glucose-Capillary: 103 mg/dL — ABNORMAL HIGH (ref 70–99)

## 2024-04-12 LAB — BASIC METABOLIC PANEL WITH GFR
Anion gap: 7 (ref 5–15)
BUN: 22 mg/dL (ref 8–23)
CO2: 24 mmol/L (ref 22–32)
Calcium: 9.1 mg/dL (ref 8.9–10.3)
Chloride: 107 mmol/L (ref 98–111)
Creatinine, Ser: 1.34 mg/dL — ABNORMAL HIGH (ref 0.44–1.00)
GFR, Estimated: 40 mL/min — ABNORMAL LOW (ref >60)
Glucose, Bld: 127 mg/dL — ABNORMAL HIGH (ref 70–99)
Potassium: 4.4 mmol/L (ref 3.5–5.1)
Sodium: 138 mmol/L (ref 135–145)

## 2024-04-12 LAB — MAGNESIUM: Magnesium: 1.2 mg/dL — ABNORMAL LOW (ref 1.7–2.4)

## 2024-04-12 MED ORDER — ENOXAPARIN SODIUM 40 MG/0.4ML IJ SOSY
40.0000 mg | PREFILLED_SYRINGE | Freq: Every day | INTRAMUSCULAR | Status: DC
  Administered 2024-04-12 – 2024-04-13 (×3): 40 mg via SUBCUTANEOUS
  Filled 2024-04-12 (×2): qty 0.4

## 2024-04-12 MED ORDER — DONEPEZIL HCL 10 MG PO TABS
5.0000 mg | ORAL_TABLET | Freq: Every day | ORAL | Status: DC
  Administered 2024-04-12: 5 mg via ORAL
  Filled 2024-04-12: qty 1

## 2024-04-12 MED ORDER — TECHNETIUM TO 99M ALBUMIN AGGREGATED
4.4000 | Freq: Once | INTRAVENOUS | Status: AC | PRN
  Administered 2024-04-12: 4.4 via INTRAVENOUS

## 2024-04-12 MED ORDER — MAGNESIUM SULFATE 4 GM/100ML IV SOLN
4.0000 g | Freq: Once | INTRAVENOUS | Status: AC
  Administered 2024-04-12: 4 g via INTRAVENOUS
  Filled 2024-04-12: qty 100

## 2024-04-12 MED ORDER — ATORVASTATIN CALCIUM 10 MG PO TABS
20.0000 mg | ORAL_TABLET | Freq: Every day | ORAL | Status: DC
  Administered 2024-04-12 – 2024-04-13 (×3): 20 mg via ORAL
  Filled 2024-04-12 (×2): qty 2

## 2024-04-12 NOTE — Progress Notes (Signed)
 0600-Patient off unit to CT  0630- Pt returned from CT

## 2024-04-12 NOTE — Progress Notes (Addendum)
 Patient has not voided all shift. RN got patient to [facility] with no success of voiding.    04/12/24 0530  Urine Measurement/Characteristics  Urinary Incontinence No  Urine Color Yellow/straw  Urine Appearance Clear  Urinary Interventions Bladder scan;Intermittent/Straight cath  Bladder Scan Volume (mL) 300 mL  Intermittent/Straight Cath (mL) 500 mL  Post Void Cath Residual (mL) 0 mL  Hygiene Peri care

## 2024-04-12 NOTE — Progress Notes (Addendum)
 STROKE TEAM PROGRESS NOTE    INTERIM HISTORY/SUBJECTIVE No family at the bedside.  Patient states that she was having trouble walking and was off balance MRI brain with acute/subacute left caudate infarct Patient has history of mixed vascular dementia was seen recently by me in the office 2 weeks ago and started on Aricept  5 mg daily. CBC    Component Value Date/Time   WBC 9.5 04/11/2024 1148   RBC 4.41 04/11/2024 1148   HGB 14.3 04/11/2024 1154   HCT 42.0 04/11/2024 1154   PLT 247 04/11/2024 1148   MCV 94.6 04/11/2024 1148   MCH 30.8 04/11/2024 1148   MCHC 32.6 04/11/2024 1148   RDW 13.3 04/11/2024 1148   LYMPHSABS 1.1 04/11/2024 1148   MONOABS 0.6 04/11/2024 1148   EOSABS 0.1 04/11/2024 1148   BASOSABS 0.1 04/11/2024 1148    BMET    Component Value Date/Time   NA 139 04/11/2024 1154   K 4.2 04/11/2024 1154   CL 104 04/11/2024 1154   CO2 21 (L) 04/11/2024 1148   GLUCOSE 143 (H) 04/11/2024 1154   BUN 28 (H) 04/11/2024 1154   CREATININE 1.50 (H) 04/11/2024 1154   CREATININE 1.04 12/25/2012 1609   CALCIUM  9.8 04/11/2024 1148   GFRNONAA 37 (L) 04/11/2024 1148    IMAGING past 24 hours CT HEAD POST STROKE FOLLOWUP/TIMED/STAT READ Result Date: 04/12/2024 EXAM: CT HEAD WITHOUT 04/12/2024 06:09:00 AM TECHNIQUE: CT of the head was performed without the administration of intravenous contrast. Automated exposure control, iterative reconstruction, and/or weight based adjustment of the mA/kV was utilized to reduce the radiation dose to as low as reasonably achievable. COMPARISON: CT head without contrast 04/11/2024. MR head without contrast 04/11/2024. CLINICAL HISTORY: Neuro deficit, acute, stroke suspected. FINDINGS: BRAIN AND VENTRICLES: No acute intracranial hemorrhage. No mass effect or midline shift. No extra-axial fluid collection. Gray-white differentiation is maintained. No hydrocephalus. Acute/subacute left periventricular white matter infarct is again noted. Chronic anterior  left frontoparietal encephalomalacia is stable. A remote infarct of the right occipital lobe is stable. ORBITS: Anterior lens replacement. SINUSES AND MASTOIDS: A polyp or mucous retention cyst is present in the right maxillary sinus. SOFT TISSUES AND SKULL: No acute skull fracture. No acute soft tissue abnormality. VASCULATURE: Atherosclerotic calcifications are present in the cavernous carotid arteries bilaterally. No hyperdense vessel is present. IMPRESSION: 1. Acute/subacute left periventricular white matter infarct. 2. Stable chronic anterior left frontaloparietal encephalomalacia and remote infarct of the right occipital lobe. Electronically signed by: Lonni Necessary MD 04/12/2024 06:24 AM EDT RP Workstation: [id number]   MR BRAIN WO CONTRAST Result Date: 04/11/2024 EXAM: MRI BRAIN WITHOUT CONTRAST 04/11/2024 01:48:56 PM TECHNIQUE: Multiplanar multisequence MRI of the head/brain was performed without the administration of intravenous contrast. COMPARISON: None available. CLINICAL HISTORY: Neuro deficit, acute, stroke suspected. FINDINGS: BRAIN AND VENTRICLES: There is a small focus of restrictive diffusion within the left caudate concerning for focus of acute infarct. Within this region there is signal abnormality and susceptibility concerning for associated hemorrhage. No significant surrounding edema. No mass effect. Redemonstrated remote infarcts in the left frontal lobe, right occipital cortex, and right parietal lobe. Additional remote infarct in the right frontal lobe again noted. Generalized parenchymal volume loss. There are nonspecific hyperintense foci in the subcortical and periventricular white matter that most likely represent chronic microangiopathic ischemic changes in a patient of this age. Chronic microhemorrhage in the left frontal lobe. ORBITS: Bilateral lens replacement. SINUSES AND MASTOIDS: Mucous retention cyst in the right maxillary sinus. BONES AND SOFT TISSUES: Normal  marrow  signal. No acute soft tissue abnormality. LIMITATIONS/ARTIFACTS: Examination is slightly limited due to motion artifact. IMPRESSION: 1. Acute versus subacute infarct in the left caudate with mild associated hemorrhage. No mass effect or midline shift. Recommend short interval follow up head CT. 2. Remote infarcts, chronic microvascular ischemic changes, and parenchymal volume loss. 3. Findings messaged to Dr. Arora via the [facility] messaging system at 3:59PM on 04/11/24. Electronically signed by: Donnice Mania MD 04/11/2024 04:00 PM EDT RP Workstation: [id number]   EEG adult Result Date: 04/11/2024 Shelton Arlin KIDD, MD     04/11/2024  1:22 PM Patient Name: Mikell [place] MRN: [id number] Epilepsy Attending: Arlin KIDD Shelton Referring Physician/Provider: Voncile Isles, MD Date: 04/11/2024 Duration: 25.26 mins Patient history: 78 y.o. female due to loss of balance and confusion. EEG to evaluate for seizure Level of alertness: Awake AEDs during EEG study: None Technical aspects: This EEG study was done with scalp electrodes positioned according to the 10-20 International system of electrode placement. Electrical activity was reviewed with band pass filter of 1-70Hz , sensitivity of 7 uV/mm, display speed of 30mm/sec with a 60Hz  notched filter applied as appropriate. EEG data were recorded continuously and digitally stored.  Video monitoring was available and reviewed as appropriate. Description: The posterior dominant rhythm consists of 8 Hz activity of moderate voltage (25-35 uV) seen predominantly in posterior head regions, symmetric and reactive to eye opening and eye closing. EEG showed intermittent generalized 3 to 6 Hz theta-delta slowing. Hyperventilation and photic stimulation were not performed.   ABNORMALITY - Intermittent slow, generalized IMPRESSION: This study is suggestive of mild diffuse encephalopathy. No seizures or epileptiform discharges were seen throughout the recording. Arlin KIDD Shelton   CT HEAD CODE  STROKE WO CONTRAST Result Date: 04/11/2024 EXAM: CT HEAD WITHOUT 04/11/2024 12:01:30 PM TECHNIQUE: CT of the head was performed without the administration of intravenous contrast. Automated exposure control, iterative reconstruction, and/or weight based adjustment of the mA/kV was utilized to reduce the radiation dose to as low as reasonably achievable. COMPARISON: MRI head dated 03/14/2024. CLINICAL HISTORY: Neuro deficit, acute, stroke suspected. Non con. Code stroke. Words finding difficulty; hx prior stroke. FINDINGS: BRAIN AND VENTRICLES: No acute intracranial hemorrhage. No mass effect or midline shift. No extra-axial fluid collection. Gray-white differentiation is maintained. No hydrocephalus. Redemonstrated encephalomalacia in the left frontal lobe compatible with remote infarct. Additional remote cortical infarct in the right occipital lobe and in the high right parietal lobe near the vertex. Nonspecific hypoattenuation in the periventricular and subcortical white matter, most likely representing chronic small vessel disease. Generalized parenchymal volume loss. ORBITS: No acute abnormality. SINUSES AND MASTOIDS: Mucous retention cyst in the right maxillary sinus. SOFT TISSUES AND SKULL: No acute skull fracture. No acute soft tissue abnormality. Bilateral lens replacement. [place] stroke program early CT (ASPECT) score: Ganglionic (caudate, IC, lentiform nucleus, insula, M1-M3): 7 Supraganglionic (M4-M6): 3 Total: 10 IMPRESSION: 1. No acute intracranial abnormality. 2. Remote infarcts as above. 3. Chronic small vessel disease and generalized parenchymal volume loss. 4. Findings messaged to Dr. Arora via the [facility] messaging system at 12:13PM on 04/11/24. Electronically signed by: Donnice Mania MD 04/11/2024 12:15 PM EDT RP Workstation: [id number]    Vitals:   04/11/24 1944 04/11/24 2249 04/12/24 0347 04/12/24 0813  BP:  (!) 156/58 134/67 (!) 147/73  Pulse: 74 74 63 62  Resp: 14 18 11 17   Temp: 98.2 F  (36.8 C) 98.1 F (36.7 C) 98.4 F (36.9 C) 98.1 F (36.7 C)  TempSrc: Oral Oral Oral  Oral  SpO2: 95% 95% 97% 96%  Weight:      Height:         PHYSICAL EXAM General:  Alert, well-nourished, well-developed patient in no acute distress Psych:  Mood and affect appropriate for situation CV: Regular rate and rhythm on monitor Respiratory:  Regular, unlabored respirations on room air GI: Abdomen soft and nontender   NEURO:  Mental Status: AA&Ox3, patient is able to give clear and coherent history Speech/Language: speech is without aphasia.  Mild dysarthria naming, repetition, fluency, and comprehension intact.  Cranial Nerves:  II: PERRL. Visual fields full.  III, IV, VI: EOMI. Eyelids elevate symmetrically.  V: Sensation is intact to light touch and symmetrical to face.  VII: Right facial droop VIII: hearing intact to voice. IX, X: Palate elevates symmetrically. Phonation is normal.  KP:Dynloizm shrug 5/5. XII: tongue is midline without fasciculations. Motor: 5/5 strength to all muscle groups tested.  Tone: is normal and bulk is normal Sensation- Intact to light touch bilaterally. Extinction absent to light touch to DSS.   Coordination: FTN intact bilaterally, HKS: no ataxia in BLE.No drift.  Gait- deferred  Most Recent NIH  1a Level of Conscious.:  1b LOC Questions:  1c LOC Commands:  2 Best Gaze:  3 Visual:  4 Facial Palsy: 2 5a Motor Arm - left:  5b Motor Arm - Right:  6a Motor Leg - Left:  6b Motor Leg - Right:  7 Limb Ataxia:  8 Sensory:  9 Best Language:  10 Dysarthria: 1 11 Extinct. and Inatten.:  TOTAL: 3   ASSESSMENT/PLAN  Beth Macdonald is a 78 y.o. 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   NIH on Admission 5  Acute Ischemic Infarct:  left caudate with mild hemorrhage  Etiology: Severe intracranial stenosis Code Stroke CT head No acute intracranial abnormality. 2. Remote infarcts as above. 3. Chronic small vessel disease and generalized parenchymal volume loss. CTA head & neck  Critical stenosis versus occlusion of the left P1 and P2 segments of the posterior cerebral artery. 2. 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 3. Moderate to severe stenoses of M2 and M3 branches of the left middle cerebral artery. MRI  Acute versus subacute infarct in the left caudate with mild associated hemorrhage. No mass effect or midline shift. Recommend short interval follow up head CT. 2. Remote infarcts, chronic microvascular ischemic changes, and parenchymal volume loss. Repeat CT head 1. Acute/subacute left periventricular white matter infarct. 2. Stable chronic anterior left frontaloparietal encephalomalacia and remote infarct of the right occipital lobe. 2D Echo (03/15/24) EF 60-65%. LV with grade I diastolic dysfunction  LDL 30 HgbA1c 8.4 VTE prophylaxis - SCD's aspirin  81 mg daily and clopidogrel  75 mg daily prior to admission, now on No antithrombotic, will hold in the setting of mild hemorrhage.  Consider restarting on discharge for 3 months then Plavix  alone Therapy recommendations:  Pending Disposition: Pending  Hx of Stroke/TIA Dementia July 2025 recent left frontal infarct with etiology due to large vessel intracranial stenosis Was discharged on aspirin  and Plavix  for 3 months then Plavix  alone.  Currently on hold due to hemorrhagic transformation.  Consider restarting  on discharge TIA 10-2018  Right ACA territory infarct in 2023 On home Aricept   Hypertension Home meds:  lisinopril  10mg ,  Stable Blood Pressure Goal: SBP less than  160   Hyperlipidemia Home meds:  repatha  and atorvastatin  20mg  ,  resumed in hospital LDL 30, goal < 70 Continue statin at discharge  Diabetes type II UncControlled Home meds:  metformin , semaglutide   HgbA1c 8.4, goal < 7.0 CBGs SSI Recommend close follow-up with PCP for better DM control  Dysphagia Patient has post-stroke dysphagia, SLP consulted    Diet   Diet Carb Modified Fluid consistency: Thin; Room service appropriate? Yes   Advance diet as tolerated   Other Active Problems History of DVT History of Bell's palsy Diabetic neuropathy  Hospital day # 0  I have personally obtained history,examined this patient, reviewed notes, independently viewed imaging studies, participated in medical decision making and plan of care.ROS completed by me personally and pertinent positives fully documented  I have made any additions or clarifications directly to the above note. Agree with note above.  Patient well-known to me from previous strokes and mixed dementia.  She presented with facial weakness and MRI scan shows new left frontal subcortical infarct.  CT angiogram showed multivessel intracranial stenosis.  Recommend aspirin  and Plavix  for 3 months and then Plavix  alone.  Continue Aricept  for dementia.  Mobilize out of bed.  Therapy consults.  Follow-up as an outpatient with stroke clinic.  Discussed with Dr. Jacalyn  I personally spent a total of 50 minutes in the care of the patient today including getting/reviewing separately obtained history, performing a medically appropriate exam/evaluation, counseling and educating, placing orders, referring and communicating with other health care professionals, documenting clinical information in the EHR, independently interpreting results, and coordinating care.         Eather Popp, MD Medical Director [facility] Pager: [phone] 04/12/2024 5:47 PM   To contact Stroke Continuity provider, please refer to [email or web address]. After  hours, contact General Neurology

## 2024-04-12 NOTE — Evaluation (Signed)
 Speech Language Pathology Evaluation Patient Details Name: Beth Macdonald MRN: [id number] DOB: 1946-03-11 Today's Date: 04/12/2024 Time: 8946-8885 SLP Time Calculation (min) (ACUTE ONLY): 21 min  Problem List:  Patient Active Problem List   Diagnosis Date Noted   Acute ischemic stroke (HCC) 03/14/2024   Weakness 11/16/2022   COVID-19 virus infection 11/15/2022   Sepsis (HCC) 11/15/2022   Emesis 11/15/2022   Metabolic acidosis 11/15/2022   CVA (cerebral vascular accident) (HCC) 06/05/2022   Essential hypertension 06/04/2022   Osteoarthritis of right knee 01/01/2022   Bilateral hip pain 11/23/2021   Murmur 03/16/2020   History of stroke 02/02/2019   Mixed hyperlipidemia 02/02/2019   TIA (transient ischemic attack) 10/29/2018   Dyslipidemia 10/29/2018   Type 2 diabetes mellitus with hyperlipidemia (HCC) 10/29/2018   Tendinopathy of hip 07/12/2016   OA (osteoarthritis) of knee 05/29/2016   Sleep disturbance 05/29/2016   Bilateral acromioclavicular joint arthritis 11/08/2015   Dizzy 07/21/2015   Rotator cuff syndrome of both shoulders 06/21/2015   Pain in joint, ankle and foot 10/20/2012   Bilateral leg weakness 10/20/2012   Past Medical History:  Past Medical History:  Diagnosis Date   Aortic atherosclerosis (HCC)    Diabetes mellitus without complication (HCC)    Type 2   Diabetic neuropathy (HCC)    DVT of proximal lower limb (HCC)    Facial paralysis/Bells palsy    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HCC)    Hyperlipidemia    Mitral valve insufficiency    Osteoarthritis    SCCA (squamous cell carcinoma) of skin 03/22/2021   Bridge of nose (in situ)   Skull fracture (HCC)    age 53   Squamous cell carcinoma of skin 03/22/2021   Right forehead (in situ)   TIA (transient ischemic attack) 2020   Varicose vein of leg    Past Surgical History:  Past Surgical History:  Procedure Laterality Date   ELBOW SURGERY Bilateral    20 yrs ago   ENDOVENOUS ABLATION  SAPHENOUS VEIN W/ LASER Bilateral    ENDOVENOUS ABLATION SAPHENOUS VEIN W/ LASER Left 11/19/2019   endovenous laser ablation anterior accessory branch of left GSV and stab phlebectomy> 20 incisions left leg by Medford Blade MD    shoulder Bilateral    7981,7980   TOENAIL EXCISION Right 2019   Big toe   TONSILLECTOMY Bilateral    TOTAL KNEE ARTHROPLASTY Right 01/01/2022   Procedure: TOTAL KNEE ARTHROPLASTY;  Surgeon: Melodi Lerner, MD;  Location: [facility];  Service: Orthopedics;  Laterality: Right;   HPI:  Beth Macdonald is a 78 y.o. female with past medical history of  prior CVA, ambulatory dysfunction with abnormal balance, status post implanted loop recorder, hypertension, type 2 diabetes, hyperlipidemia, CKD 3A, history of facial paralysis/Bell's palsy. Pt had signs of dysarthria and aphasia and right sided weaknesses [place]. Pt confirmed to have an acute CVA per head CT on 8/10 "Acute/subacute left periventricular white matter infarct. Stable chronic anterior left frontoparietal encephalomalacia and remote. Infarct of the right occipital lobe"  Assessment / Plan / Recommendation Clinical Impression   Pt seen for skilled ST for speech-language cognitive evaluation. The pt was previously assessed by speech on July 13th following a CVA- at the time the eval revealed mild dysarthria, some voice differences, and upper level cognitive impairment. Today, she presents with a moderate-severe cognitive impairment. She was given the [facility]. [facility] Mental Status)- she scored a 3/30 which is well below the range of normal  cognition and may be indicative of dementia range. The pt had overt difficulty with attending the [facility] and answering biographical questions and often relied on her wife and friend to interject and provide the accurate information. The pt had overt difficulty with orientation, delayed recall, immediate story recall,  word finding, and all executive function  skills (numeric calculations, clock drawing, problem solving, etc). The SLP reviewed the pt's clock with her, and she required moderate verbal and visual cues to identify her mistakes. She had poor orientation to her situation- being unable to identify The pt's voice per wife and pt reports is at baseline from prior CVA and is not much of a concern. Her speech remains very slightly dysarthric in speech- with 100% intelligibility. Per pt's wife report Beth Macdonald, they handle their own finances, medication, and iADLs within each other- her family friend reports that her children call her two times a day to remind her to take medication when the pt initially reports she doesn't take any meds. Per her friend Beth Macdonald reports, the pt's wife is also experiencing medical issues/cognitive decline and she expressed concern for all ADL/iADL management upon d/c. Social work to f/u with wife Beth Macdonald about concerns presented by friend Beth Macdonald. The pt presents with a significant cognitive impairment and would benefit from functional cognitive tx, recs for palcement d/c unknown at this time.    SLP Assessment  SLP Recommendation/Assessment: Patient needs continued Speech Language Pathology Services SLP Visit Diagnosis: Cognitive communication deficit (R41.841);Attention and concentration deficit     Assistance Recommended at Discharge  Frequent or constant Supervision/Assistance  Functional Status Assessment Patient has had a recent decline in their functional status and demonstrates the ability to make significant improvements in function in a reasonable and predictable amount of time.  Frequency and Duration min 2x/week  1 week      SLP Evaluation Cognition  Overall Cognitive Status: Impaired/Different from baseline (Likely mild-moderate cog deficits at baseline) Arousal/Alertness: Awake/alert Orientation Level: Oriented to person;Disoriented to situation;Oriented to place;Disoriented to time Year: Other (Comment)  [phone]... 1965) Month: May Day of Week: Incorrect Attention: Focused Focused Attention: Impaired Focused Attention Impairment: Verbal basic;Verbal complex;Functional basic;Functional complex Memory: Impaired Memory Impairment: Retrieval deficit;Decreased short term memory;Decreased long term memory Decreased Long Term Memory: Functional basic;Functional complex Decreased Short Term Memory: Functional basic;Functional complex Awareness: Impaired Awareness Impairment: Intellectual impairment;Emergent impairment;Anticipatory impairment Problem Solving: Impaired Problem Solving Impairment: Functional basic;Functional complex;Verbal basic;Verbal complex Executive Function: Organizing;Self Correcting;Sequencing Sequencing: Impaired Sequencing Impairment: Functional complex Organizing: Impaired Organizing Impairment: Functional basic;Functional complex Self Correcting: Impaired Self Correcting Impairment: Verbal basic;Functional basic;Functional complex;Verbal complex Safety/Judgment: Impaired       Comprehension  Auditory Comprehension Overall Auditory Comprehension: Appears within functional limits for tasks assessed Yes/No Questions: Impaired Basic Biographical Questions: 26-50% accurate Basic Immediate Environment Questions: 50-74% accurate Visual Recognition/Discrimination Discrimination: Exceptions to [facility] Other Visual Recogniton/Discrimination Comments: Unable to identify triangle in visual field of three Reading Comprehension Reading Status: Not tested    Expression Expression Primary Mode of Expression: Verbal Verbal Expression Overall Verbal Expression: Impaired at baseline Level of Generative/Spontaneous Verbalization: Music therapist Expression Dominant Hand: Right   Oral / Motor  Oral Motor/Sensory Function Overall Oral Motor/Sensory Function: Moderate impairment Facial ROM: Reduced right Facial Symmetry: Abnormal symmetry right Facial Strength: Reduced  right Motor Speech Overall Motor Speech: Impaired at baseline Respiration: Within functional limits Phonation: Normal Resonance: Within functional limits Articulation: Impaired Level of Impairment: Conversation Intelligibility: Intelligible            Manuelita Blew  M.S. CCC-SLP

## 2024-04-12 NOTE — Progress Notes (Signed)
 PT Cancellation Note  Patient Details Name: Beth Macdonald MRN: [id number] DOB: 1946/04/29   Cancelled Treatment:    Reason Eval/Treat Not Completed: (P) Patient not medically ready. Discussed case with MD with pt planning to have pulmonary perfusion test along with venous doppler today. Will plan to follow-up as able once medically cleared.   Theo Ferretti, PT, DPT Acute Rehabilitation Services  Office: [phone]     Theo CHRISTELLA Ferretti 04/12/2024, 8:23 AM

## 2024-04-12 NOTE — Progress Notes (Signed)
 OT Cancellation Note  Patient Details Name: Beth Macdonald MRN: [id number] DOB: Dec 13, 1945   Cancelled Treatment:    Reason Eval/Treat Not Completed: Patient not medically ready (Reason Eval/Treat Not Completed: (P) Patient not medically ready. Discussed case with MD with pt planning to have pulmonary perfusion test along with venous doppler today. Will plan to follow-up as able once medically cleared.)  [facility] 04/12/2024, 9:11 AM Kreg Sink, OT/L   Acute OT Clinical Specialist Acute Rehabilitation Services Pager [phone] Office [phone]

## 2024-04-12 NOTE — Care Management Obs Status (Signed)
 MEDICARE OBSERVATION STATUS NOTIFICATION   Patient Details  Name: KIPPY MELENA MRN: [id number] Date of Birth: 04-Jan-1946   Medicare Observation Status Notification Given:  No (Patient confused. Phone call to Spouse to explain MOON letter, voicemail left.)    Robynn Eileen Hoose, RN 04/12/2024, 2:17 PM

## 2024-04-12 NOTE — Progress Notes (Addendum)
 Stat order for pulmonary perfusion to be done by nuclear medicine. In amion there is no one signed in at this current time to contact to perform this test.   This RN secured chat Mario Blanch, MD of this information. Received orders to have the pulmonary perfusion test along with venous doppler to be done in the morning. As well as continuous pulse ox throughout night and notify abnormal findings to on-call provider.

## 2024-04-12 NOTE — Progress Notes (Signed)
 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 In the ED afebrile hemodynamically stable.  Labs with creatinine 1.4 stable CBC LDL 30,UA unremarkable glucose 144 Patient was admitted to rule out a stroke and for further workup.  Neurology was consulted  Subjective: Seen and examined today Remains afebrile saturating well on room air He spouse is in room.  She is on Bedside chair, aao to dob self people place but no to date and president, D-dimer elevated 3.8 on admission at 19:53 pm Patient otherwise denies any nausea, vomiting, chest pain, shortness of breath, leg swelling  Assessment and plan:  Acute ischemic stroke w/ hemorrhagic conversion Recent stroke in July/08/2024 with residual left-sided weakness/post history of dysphagia: Cont to monitor in tele BP goal : Permissive HTN upto 220/110 mmHg  MRI Brain: Acute versus subacute infarct left caudate with mild hemorrhage no mass effect, remote infarcts chronic microvascular ischemic changes> follow-up CT head this morning with acute/subacute left periventricular white matter infarct stable chronic anterior left frontoparietal encephalomalacia and remote infarct right occipital lobe CTA head and neck: Not performed as patient not having symptoms of large vessel occlusion Echocardiogram: Completed on July/13/25 EF 60 to 65% no RWMA G1 DD AV tricuspid MV normal no  atrial level shunt detected, Prophylactic therapy-no antiplatelets or anticoagulants due to hemorrhagic transformation.  Recently she was on aspirin /Plavix  from July 14- w/ plan to continue for 3 months then Plavix  alone LDL is at 30 goal less than 70, A1c 8.4 on seventh July 12 goal less than 7-continue risk factor modification EEG shows mild diffuse encephalopathy.  UA without evidence of UTI. Cont PT consult, OT consult, Speech consult Frequent neuro checks As previously planned states outpatient follow-up with [facility] in 12 weeks.  Elevated D-dimer 3.8: Patient had D-dimer checked for reason unclear to me-no presentation of chest pain or shortness of breath or hypoxia or leg swelling.It was elevated 3.8 on admission at 19:53 pm> patient has VQ scan and duplex of the leg ordered on admission>will follow-up.Clearly she is unable to get any anticoagulation at the moment regardless given hemorrhagic transformation on stroke.  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  Avoid nephrotoxins, monitor renal function Recent Labs    03/14/24 1239 03/14/24 2136 03/15/24 0632 04/11/24 1148 04/11/24 1154 04/12/24 0807  BUN 26*  --  22 26* 28* 22  CREATININE 1.65* 1.37* 1.29* 1.42* 1.50* 1.34*  CO2 25  --  21* 21*  --  24  K 4.2  --  3.9 4.2 4.2 4.4    Hypomagnesemia 1.2 on admission: Replete iv  Hyperlipidemia: PTA on Lipitor and also takes outpatient Repatha , LDL at goal.  Continue home regimen  Type 2 diabetes w/ neuropathy: PTA on metformin  and semaglutide , A1c recently on 8.  Continue SSI Recent Labs  Lab 04/11/24 1147 04/11/24 1900 04/11/24 2110 04/12/24 0822  GLUCAP 140* 131* 131* 117*    OA PT OT.  Symptomatic management  Essential hypertension: PTA on lisinopril  currently on hold  Mobility: PT Orders: Active PT Follow up Rec:    DVT prophylaxis:   Code Status:   Code Status: Full Code Family Communication: plan of care discussed with patient/spouse at bedside. Patient status is: Remains hospitalized because of severity of illness Level of care: Telemetry Medical   Dispo: The patient is from: home with spouse            Anticipated disposition: TBD Objective: Vitals last 24 hrs: Vitals:   04/11/24 1944 04/11/24 2249 04/12/24 0347 04/12/24 0813  BP:  (!) 156/58 134/67 (!) 147/73  Pulse: 74 74 63 62  Resp: 14 18 11 17   Temp: 98.2 F (36.8 C) 98.1 F (36.7 C) 98.4 F (36.9 C) 98.1 F (36.7 C)  TempSrc: Oral Oral Oral Oral  SpO2: 95% 95% 97% 96%  Weight:      Height:        Physical Examination: General exam: alert awake, oriented, older than stated age HEENT:Oral mucosa moist, Ear/Nose WNL grossly Respiratory system: Bilaterally clear BS,no use of accessory muscle Cardiovascular system: S1 & S2 +, No JVD. Gastrointestinal system: Abdomen soft,NT,ND, BS+ Nervous System: Alert, awake, moving all extremities,and following commands. Rt facial weakness chronic. Extremities: LE edema neg, distal extremities warm.  Skin: No rashes,no icterus. MSK: Normal muscle bulk,tone, power   Medications reviewed:  Scheduled Meds:   stroke: early stages of recovery book   Does not apply Once   atorvastatin   20 mg Oral Daily   donepezil   5 mg Oral QHS   insulin  aspart  0-15 Units Subcutaneous TID WC   sodium chloride  flush  3 mL Intravenous Once   Continuous Infusions:  sodium chloride  50 mL/hr at 04/11/24 1856   magnesium  sulfate bolus IVPB 4 g (04/12/24 1053)   Diet: Diet Order             Diet Carb Modified Fluid consistency: Thin; Room service appropriate? Yes  Diet effective now                      Data Reviewed: I have personally reviewed following labs and imaging studies ( see epic result tab) CBC: Recent Labs  Lab 04/11/24 1148 04/11/24 1154  WBC 9.5  --   NEUTROABS 7.5  --   HGB 13.6 14.3  HCT 41.7  42.0  MCV 94.6  --   PLT 247  --    CMP: Recent Labs  Lab 04/11/24 1148 04/11/24 1154 04/12/24 0807  NA 139 139 138  K 4.2 4.2 4.4  CL 103 104 107  CO2 21*  --  24  GLUCOSE 144* 143* 127*  BUN 26* 28* 22  CREATININE 1.42* 1.50* 1.34*  CALCIUM  9.8  --  9.1  MG 1.2*  --  1.2*   GFR: Estimated Creatinine Clearance: 33.8 mL/min (A) (by C-G formula based on SCr of 1.34 mg/dL (H)). Recent Labs  Lab 04/11/24 1148  AST 25  ALT 18  ALKPHOS 33*  BILITOT 0.6  PROT 7.2  ALBUMIN  3.8   No results for input(s): LIPASE, AMYLASE in the last 168 hours. No results for input(s): AMMONIA in the last 168 hours. Coagulation Profile:  Recent Labs  Lab 04/11/24 1148  INR 1.0   Unresulted Labs (From admission, onward)     Start     Ordered   04/13/24 0500  Basic metabolic panel with GFR  Daily,   R  Question:  Specimen collection method  Answer:  Lab=Lab collect   04/12/24 0806   04/13/24 0500  CBC  Daily,   R     Question:  Specimen collection method  Answer:  Lab=Lab collect   04/12/24 0806   04/13/24 0500  Magnesium   Tomorrow morning,   R       Question:  Specimen collection method  Answer:  Lab=Lab collect   04/12/24 0806           Antimicrobials/Microbiology: Anti-infectives (From admission, onward)    None         Component Value Date/Time   SDES BLOOD SITE NOT SPECIFIED 11/15/2022 0034   SPECREQUEST  11/15/2022 0034    BOTTLES DRAWN AEROBIC AND ANAEROBIC Blood Culture results may not be optimal due to an excessive volume of blood received in culture bottles   CULT  11/15/2022 0034    NO GROWTH 5 DAYS Performed at [facility], [address], [place], [place] [address]    REPTSTATUS 11/20/2022 FINAL 11/15/2022 0034    Procedures:  Mennie LAMY, MD [facility] 04/12/2024, 11:45 AM

## 2024-04-13 ENCOUNTER — Inpatient Hospital Stay (HOSPITAL_COMMUNITY): Payer: MEDICARE

## 2024-04-13 DIAGNOSIS — I6389 Other cerebral infarction: Secondary | ICD-10-CM | POA: Diagnosis not present

## 2024-04-13 DIAGNOSIS — R7989 Other specified abnormal findings of blood chemistry: Secondary | ICD-10-CM | POA: Diagnosis not present

## 2024-04-13 DIAGNOSIS — E1151 Type 2 diabetes mellitus with diabetic peripheral angiopathy without gangrene: Secondary | ICD-10-CM | POA: Diagnosis not present

## 2024-04-13 DIAGNOSIS — I629 Nontraumatic intracranial hemorrhage, unspecified: Secondary | ICD-10-CM | POA: Diagnosis not present

## 2024-04-13 DIAGNOSIS — I639 Cerebral infarction, unspecified: Secondary | ICD-10-CM | POA: Diagnosis not present

## 2024-04-13 DIAGNOSIS — R29703 NIHSS score 3: Secondary | ICD-10-CM | POA: Diagnosis not present

## 2024-04-13 LAB — CBC
HCT: 37.8 % (ref 36.0–46.0)
Hemoglobin: 12.7 g/dL (ref 12.0–15.0)
MCH: 31 pg (ref 26.0–34.0)
MCHC: 33.6 g/dL (ref 30.0–36.0)
MCV: 92.2 fL (ref 80.0–100.0)
Platelets: 248 K/uL (ref 150–400)
RBC: 4.1 MIL/uL (ref 3.87–5.11)
RDW: 13.4 % (ref 11.5–15.5)
WBC: 6.3 K/uL (ref 4.0–10.5)
nRBC: 0 % (ref 0.0–0.2)

## 2024-04-13 LAB — BASIC METABOLIC PANEL WITH GFR
Anion gap: 8 (ref 5–15)
BUN: 16 mg/dL (ref 8–23)
CO2: 26 mmol/L (ref 22–32)
Calcium: 9 mg/dL (ref 8.9–10.3)
Chloride: 107 mmol/L (ref 98–111)
Creatinine, Ser: 1.27 mg/dL — ABNORMAL HIGH (ref 0.44–1.00)
GFR, Estimated: 43 mL/min — ABNORMAL LOW (ref >60)
Glucose, Bld: 119 mg/dL — ABNORMAL HIGH (ref 70–99)
Potassium: 4.4 mmol/L (ref 3.5–5.1)
Sodium: 141 mmol/L (ref 135–145)

## 2024-04-13 LAB — MAGNESIUM: Magnesium: 1.9 mg/dL (ref 1.7–2.4)

## 2024-04-13 LAB — GLUCOSE, CAPILLARY
Glucose-Capillary: 103 mg/dL — ABNORMAL HIGH (ref 70–99)
Glucose-Capillary: 126 mg/dL — ABNORMAL HIGH (ref 70–99)

## 2024-04-13 MED ORDER — LISINOPRIL 5 MG PO TABS
10.0000 mg | ORAL_TABLET | Freq: Every day | ORAL | Status: DC
  Administered 2024-04-13 (×2): 10 mg via ORAL
  Filled 2024-04-13: qty 2

## 2024-04-13 MED ORDER — KATE FARMS STANDARD 1.4 PO LIQD
325.0000 mL | Freq: Two times a day (BID) | ORAL | Status: DC
  Administered 2024-04-13 (×2): 325 mL via ORAL
  Filled 2024-04-13: qty 325

## 2024-04-13 MED ORDER — LISINOPRIL 20 MG PO TABS: 20.0000 mg | ORAL_TABLET | Freq: Every day | ORAL | 0 refills | Status: DC

## 2024-04-13 MED ORDER — ADULT MULTIVITAMIN W/MINERALS CH
1.0000 | ORAL_TABLET | Freq: Every day | ORAL | Status: DC
  Administered 2024-04-13 (×2): 1 via ORAL
  Filled 2024-04-13: qty 1

## 2024-04-13 MED ORDER — KATE FARMS STANDARD 1.4 PO LIQD: 325.0000 mL | Freq: Two times a day (BID) | ORAL | 0 refills | Status: AC

## 2024-04-13 MED ORDER — HYDRALAZINE HCL 20 MG/ML IJ SOLN
5.0000 mg | Freq: Four times a day (QID) | INTRAMUSCULAR | Status: DC | PRN
  Administered 2024-04-13 (×2): 5 mg via INTRAVENOUS
  Filled 2024-04-13: qty 1

## 2024-04-13 NOTE — Progress Notes (Signed)
 Discharge instructions (including medications) discussed with and copy provided to patient/caregiver. Both verbalized understanding. PIV x1 removed and patient assisted with dressing.

## 2024-04-13 NOTE — Evaluation (Signed)
 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 MRI brain with acute/subacute left caudate infarct with mild hemorrhage. PMH:. 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     Clinical Impressions PTA pt lives with her wife Beth Macdonald who assists with ADL and IADL tasks as needed. States she doesn't use a RW within the home and has not had recent falls. Beth Macdonald states Beth Macdonald has been spending more time in the bed over the last few weeks and no longer watches TV. Enjoys spending time at the [employer] however fatigues easily. Overall CGA with ADL and mobility @ RW level. Recommend HHOT follow up. Pt/wife in agreement. Discussed possible use of a rollator due to fatigue - will further assess/discussed with PT. Acute OT to follow to facilitate safe DC home.      If plan is discharge home, recommend the following:   A little help with walking and/or transfers;A little help with bathing/dressing/bathroom;Assistance with cooking/housework;Direct supervision/assist for financial management;Direct supervision/assist for medications management;Assist for transportation     Functional Status Assessment   Patient has had a recent decline in their functional status and demonstrates the ability to make significant improvements in function in a reasonable and predictable amount of time.     Equipment Recommendations   Other (comment) (assess use of rollator)     Recommendations for Other Services   PT consult      Precautions/Restrictions   Precautions Precautions: Fall Restrictions Weight Bearing Restrictions Per Provider Order: No     Mobility Bed Mobility Overal bed mobility: Modified Independent                  Transfers Overall transfer level: Needs assistance Equipment used: Rolling walker (2 wheels) Transfers: Sit to/from Stand, Bed to chair/wheelchair/BSC Sit to Stand: Supervision     Step pivot transfers: Contact guard assist            Balance Overall balance assessment: Needs assistance   Sitting balance-Leahy Scale: Good       Standing balance-Leahy Scale: Fair                             ADL either performed or assessed with clinical judgement   ADL Overall ADL's : Needs assistance/impaired Eating/Feeding: Modified independent   Grooming: Set up;Supervision/safety;Standing   Upper Body Bathing: Set up;Sitting   Lower Body Bathing: Contact guard assist;Sit to/from stand   Upper Body Dressing : Set up;Sitting   Lower Body Dressing: Contact guard assist;Sit to/from stand   Toilet Transfer: Contact guard assist;Ambulation   Toileting- Clothing Manipulation and Hygiene: Supervision/safety       Functional mobility during ADLs: Contact guard assist;Rolling walker (2 wheels)       Vision Baseline Vision/History: 1 Wears glasses (reading glasses) Ability to See in Adequate Light: 0 Adequate Patient Visual Report: No change from baseline Vision Assessment?: Yes Eye Alignment: Within Functional Limits Ocular Range of Motion: Within Functional Limits Alignment/Gaze Preference: Within Defined Limits Tracking/Visual Pursuits: Able to track stimulus in all quads without difficulty Saccades: Within functional limits Convergence: Within functional limits Visual Fields: No  apparent deficits Additional Comments: decreased visual attention noted     Perception Perception: Within Functional Limits       Praxis Praxis: WFL        Pertinent Vitals/Pain Pain Assessment Pain Assessment: No/denies pain     Extremity/Trunk Assessment Upper Extremity Assessment Upper Extremity Assessment: Generalized weakness (but functional)   Lower Extremity Assessment Lower Extremity Assessment: Defer to PT evaluation   Cervical / Trunk Assessment Cervical / Trunk Assessment: Kyphotic;Other exceptions (foward head)   Communication Communication Communication: No apparent difficulties   Cognition Arousal: Alert Behavior During Therapy: WFL for tasks assessed/performed Cognition: Cognition impaired, History of cognitive impairments   Orientation impairments: Time Awareness: Intellectual awareness impaired, Online awareness impaired Memory impairment (select all impairments): Short-term memory, Working [employer] fast streamer, Conservation officer, historic buildings Attention impairment (select first level of impairment): Selective attention Executive functioning impairment (select all impairments): Reasoning, Problem solving OT - Cognition Comments: cognition impaired however pt ablet o follow commands and complete functional tasks                 Following commands: Intact       Cueing  General Comments          Exercises     Shoulder Instructions      Home Living Family/patient expects to be discharged to:: Private residence Living Arrangements: Spouse/significant other Available Help at Discharge: Family;Available 24 hours/day (Pt's wife available and support from friends) Type of Home: House Home Access: Level entry     Home Layout: One level     Bathroom Shower/Tub: Producer, television/film/video: Standard Bathroom Accessibility: Yes How Accessible: Accessible via walker Home Equipment: Cane - single point;Rolling Walker (2 wheels);Shower seat;Grab bars - tub/shower   Additional Comments: states RW is broken?  Lives With: Spouse Beth Macdonald)    Prior Functioning/Environment Prior Level of Function : Needs assist        Physical Assist : ADLs (physical) (wife helps as needed)   ADLs (physical): Dressing;Bathing;IADLs (if needed; family also calls and makes sure they are both takin gtheir medicine) Mobility Comments: uses a cane or RW at times      OT Problem List: Decreased strength;Decreased activity tolerance;Impaired balance (sitting and/or standing);Decreased cognition;Decreased safety awareness   OT Treatment/Interventions: Self-care/ADL training;Therapeutic exercise;Energy conservation;DME and/or AE instruction;Therapeutic activities;Patient/family education;Balance training      OT Goals(Current goals can be found in the care plan section)   Acute Rehab OT Goals Patient Stated Goal: home OT Goal Formulation: With patient/family Time For Goal Achievement: 04/27/24 Potential to Achieve Goals: Good   OT Frequency:  Min 2X/week    Co-evaluation              AM-PAC OT 6 Clicks Daily Activity     Outcome Measure Help from another person eating meals?: None Help from another person taking care of personal grooming?: A Little Help from another person toileting, which includes using toliet, bedpan, or urinal?: A Little Help from another person bathing (including washing, rinsing, drying)?: A Little Help from another person to put on and taking off regular upper body clothing?: A Little Help from another person to put on and taking off regular lower body clothing?: A Little 6 Click Score: 19   End of Session Equipment Utilized During Treatment: Gait belt;Rolling walker (2 wheels) Nurse Communication: Mobility status  Activity Tolerance: Patient tolerated treatment well Patient left: in chair;with call bell/phone within reach;with chair alarm set;with family/visitor present  OT Visit Diagnosis: Unsteadiness on feet (R26.81);Muscle  weakness (generalized) (M62.81);Other symptoms and signs involving cognitive function                Time: 1006-1036 OT Time Calculation (min): 30  min Charges:  OT General Charges $OT Visit: 1 Visit OT Evaluation $OT Eval Moderate Complexity: 1 Mod OT Treatments $Self Care/Home Management : 8-22 mins  Kreg Sink, OT/L   Acute OT Clinical Specialist Acute Rehabilitation Services Pager [phone] Office [phone]   [facility] 04/13/2024, 10:50 AM

## 2024-04-13 NOTE — Progress Notes (Signed)
 Initial Nutrition Assessment  DOCUMENTATION CODES:   Not applicable  INTERVENTION:   Encourage PO intake Room service with assist  [facility] 1.4 PO BID, each supplement provides 455 kcal and 20 grams protein. Encourage small frequent meals t help with early satiety  MVI with minerals daily   NUTRITION DIAGNOSIS:   Inadequate oral intake related to early satiety (from Ozempic ) as evidenced by  (Pt only eating 1 meal per day).   GOAL:   Patient will meet greater than or equal to 90% of their needs   MONITOR:   PO intake, Weight trends, Supplement acceptance  REASON FOR ASSESSMENT:   Consult Assessment of nutrition requirement/status  ASSESSMENT:  78 y.o. female with PMH of prior CVA (03/15/2024) with residual left-sided weakness/balance difficulties and dysphagia, multiple TIA,  HTN, T2DM with neuropathy, HLD,  CKD 3A, history of facial paralysis/Bell's palsy, dementia,   Presented with leg weakness, facial droop, aphasia, and dysarthria. Admitted for stroke.  Pt lying in bed, unable to provide accurate history as pt has history of dementia,obtained history from wife at bedside. Pt with hx of multiple strokes which has lead to balance difficulties and falls. Pt started Ozempic  4 months ago and since then she has only been eating 1 meal per day, does drink juice and soda. Pt and wife do not know her UBW however wife believes she has had to lose weight with how little she eats. 4 months ago pt was 175 lbs and she has intentionally lost 9 lbs, 5% since being on Ozempic . On exam pt with little to no muscle or fat deficits.    Had 75% of her breakfast this morning and had lunch at bedside. Encouraged pt to increase PO intake as only eating 1 meal a day is unsustainable and can lead to muscle loss.  Pt is at high risk of becoming malnourished.  Encouraged small frequent meals if pt is experiencing early satiety.  Pt agreeable to trying Mallie Pinion as pt is lactose intolerant and does not  want nay dairy based product.   Pt and family endorse no issues with chewing or swallowing. No noted nausea, vomiting or diarrhea. Pt to DC today.   Admit weight: 75.6 kg  Current weight: 75.6 kg   Average Meal Intake: No meal documented  Nutritionally Relevant Medications: Scheduled Meds:  feeding supplement (KATE FARMS STANDARD 1.4)  325 mL Oral BID BM   insulin  aspart  0-15 Units Subcutaneous TID WC   lisinopril   10 mg Oral Daily   multivitamin with minerals  1 tablet Oral Daily   Labs Reviewed: Creatinine 1.27  GFR 43 CBG ranges from 103-137  mg/dL over the last 24 hours HgbA1c 8.4  NUTRITION - FOCUSED PHYSICAL EXAM:  Flowsheet Row Most Recent Value  Orbital Region No depletion  Upper Arm Region No depletion  Thoracic and Lumbar Region No depletion  Buccal Region No depletion  Temple Region No depletion  Clavicle Bone Region No depletion  Clavicle and Acromion Bone Region No depletion  Scapular Bone Region No depletion  Dorsal Hand No depletion  Patellar Region No depletion  Anterior Thigh Region No depletion  Posterior Calf Region Unable to assess  Edema (RD Assessment) None  Hair Reviewed  Eyes Reviewed  Mouth Reviewed  Skin Reviewed  Nails Reviewed    Diet Order:   Diet Order             Diet Carb Modified Fluid consistency: Thin; Room service appropriate? Yes with Assist  Diet  effective now           Diet Carb Modified                   EDUCATION NEEDS:   Education needs have been addressed  Skin:  Skin Assessment: Reviewed RN Assessment  Last BM:  PTA  Height:   Ht Readings from Last 1 Encounters:  04/11/24 5' 8 (1.727 m)    Weight:   Wt Readings from Last 1 Encounters:  04/11/24 75.6 kg    Ideal Body Weight:  63.6 kg  BMI:  Body mass index is 25.34 kg/m.  Estimated Nutritional Needs:   Kcal:  1600-1800 kcal  Protein:  80-100 gm  Fluid:  >1.6L/day   Olivia Kenning, RD Registered Dietitian  See Amion for more  information

## 2024-04-13 NOTE — TOC Transition Note (Signed)
 Transition of Care (TOC) - Discharge Note Rayfield Gobble RN, BSN Inpatient Care Management Unit 4E- RN Case Manager See Treatment Team for direct phone #   Patient Details  Name: Beth Macdonald MRN: [id number] Date of Birth: Mar 13, 1946  Transition of [facility]) CM/SW Contact:  Gobble Rayfield Hurst, RN Phone Number: 04/13/2024, 2:00 PM   Clinical Narrative:    Pt stable for transition home. Orders placed for HHPT/OT.   CM in to see pt at bedside- spouse also present. Discussed HH needs- pt is active with [facility] for HHPT- pt/spouse voice that they would like to continue [facility] w/ [facility] at this time.  Declined any DME needs. Pt voiced she is ready to return home.   Call made to [facility] and confirm services- [facility] will resume HH and use new orders for both PT/OT.   Spouse to transport home. Meds sent to community pharmacy for pickup.   No further CM needs noted.   Final next level of care: Home w Home Health Services Barriers to Discharge: No Barriers Identified   Patient Goals and CMS Choice Patient states their goals for this hospitalization and ongoing recovery are:: return home CMS Medicare.gov Compare Post Acute Care list provided to:: Patient Choice offered to / list presented to : Patient, Spouse      Discharge Placement               Home w/ [facility]        Discharge Plan and Services Additional resources added to the After Visit Summary for     Discharge Planning Services: CM Consult Post Acute Care Choice: Home Health, Resumption of Svcs/PTA Provider          DME Arranged: N/A DME Agency: NA       HH Arranged: PT, OT HH Agency: [facility] Date [facility] Agency Contacted: 04/13/24 Time HH Agency Contacted: 1400 Representative spoke with at [facility] Agency: Darleene  Social Drivers of Health (SDOH) Interventions SDOH Screenings   Food Insecurity: Patient Unable To Answer (04/11/2024)  Housing: Patient Unable To Answer (04/11/2024)  Transportation  Needs: Patient Unable To Answer (04/11/2024)  Utilities: Patient Unable To Answer (04/11/2024)  Depression (PHQ2-9): Low Risk  (12/15/2018)  Social Connections: Patient Unable To Answer (04/11/2024)  Recent Concern: Social Connections - Moderately Isolated (03/14/2024)  Tobacco Use: Medium Risk (04/11/2024)     Readmission Risk Interventions    04/13/2024    2:00 PM  Readmission Risk Prevention Plan  Post Dischage Appt Complete  Medication Screening Complete  Transportation Screening Complete

## 2024-04-13 NOTE — Progress Notes (Signed)
   04/13/24 1307  Provider Notification  Provider Name/Title Dr. Mennie  Date Provider Notified 04/13/24  Time Provider Notified 1307  Method of Notification Page  Notification Reason Other (Comment) (Manual BP 180/80)  Provider response Evaluate remotely;See new orders (Per Dr. Mennie Let's give 5mg  of Hydralazine , if BP better later she could go home)  Date of Provider Response 04/13/24  Time of Provider Response 1309   Dr. Mennie notified of administration of IV hydralazine . Per Dr. Mennie Monitor for 1hr, and if BP better she could go home.  1437- Dr. Mennie notified of BP 135/80. Per Dr. Mennie BP better, she can go.

## 2024-04-13 NOTE — Evaluation (Signed)
 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 MRI brain with acute/subacute left caudate infarct with mild hemorrhage. PMH:. 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  Clinical Impression   Pt admitted secondary to problem above with deficits below. PTA patient lives with wife in one level home with level entry. She reports she mostly walked without a device, but did hold onto the furniture. She denies falls and endorses one near-fall at the [employer]. Pt currently requires CGA and cues for use of rollator. Discussed that she may not have room to use it in the house, and that it would be helpful in the community (her goal is to be able to return to [employer]). Wife reported pt's current RW does not fit into their bathroom. Demonstrated how to turn sideways with RW and side-step through doorway. Patient appreciative of all recommendations and agrees with HHPT for continued education in use of RW and rollator.  Anticipate patient will benefit from PT to address problems listed below. Will continue to follow acutely to maximize functional mobility, independence, and safety.           If plan is discharge home, recommend the following: A little help with walking and/or transfers;Assistance with cooking/housework   Can travel by private Data processing manager (4 wheels)  Recommendations for Other Services       Functional Status Assessment Patient has had a recent decline in their  functional status and demonstrates the ability to make significant improvements in function in a reasonable and predictable amount of time.     Precautions / Restrictions Precautions Precautions: Fall Precaution/Restrictions Comments: denies falls, but had a close call at the [employer] Bearing Restrictions Per Provider Order: No      Mobility  Bed Mobility Overal bed mobility: Modified Independent                  Transfers Overall transfer level: Needs assistance Equipment used: Rollator (4 wheels) Transfers: Sit to/from Stand, Bed to chair/wheelchair/BSC Sit to Stand: Contact guard assist   Step pivot transfers: Contact guard assist       General transfer comment: vc for safe use of rollator/brakes    Ambulation/Gait Ambulation/Gait assistance: Contact guard assist, Supervision Gait Distance (Feet): 60 Feet Assistive device: Rollator (4 wheels) Gait Pattern/deviations: Step-through pattern, Trunk flexed, Decreased stride length       General Gait Details: tends to let rollator get too far ahead; during one turn to left, she let her left foot step outside wheels  Stairs            Wheelchair Mobility     Tilt Bed    Modified Rankin (Stroke Patients Only)       Balance Overall balance assessment: Needs assistance   Sitting balance-Leahy Scale: Good       Standing balance-Leahy Scale: Fair                               Pertinent Vitals/Pain Pain Assessment Pain Assessment: No/denies pain    Home  Living Family/patient expects to be discharged to:: Private residence Living Arrangements: Spouse/significant other Available Help at Discharge: Family;Available 24 hours/day (Pt's wife available and support from friends) Type of Home: House Home Access: Level entry       Home Layout: One level Home Equipment: Cane - single Librarian, academic (2 wheels);Shower seat;Grab bars - tub/shower Additional  Comments: states RW is broken?    Prior Function Prior Level of Function : Needs assist       Physical Assist : ADLs (physical) (wife helps as needed)   ADLs (physical): Dressing;Bathing;IADLs (if needed; family also calls and makes sure they are both takin gtheir medicine) Mobility Comments: uses a cane or RW at times; reports often furniture surfs with no device       Extremity/Trunk Assessment   Upper Extremity Assessment Upper Extremity Assessment: Defer to OT evaluation    Lower Extremity Assessment Lower Extremity Assessment: Generalized weakness (RLE slightly weaker than LLE)    Cervical / Trunk Assessment Cervical / Trunk Assessment: Kyphotic;Other exceptions (forward head)  Communication   Communication Communication: No apparent difficulties    Cognition Arousal: Alert Behavior During Therapy: WFL for tasks assessed/performed                             Following commands: Intact       Cueing Cueing Techniques: Verbal cues     General Comments General comments (skin integrity, edema, etc.): Wife present.    Exercises     Assessment/Plan    PT Assessment Patient needs continued PT services  PT Problem List Decreased strength;Decreased balance;Decreased mobility;Decreased knowledge of use of DME       PT Treatment Interventions DME instruction;Gait training;Functional mobility training;Therapeutic activities;Therapeutic exercise;Balance training;Neuromuscular re-education;Patient/family education    PT Goals (Current goals can be found in the Care Plan section)  Acute Rehab PT Goals Patient Stated Goal: be able to return to [employer] PT Goal Formulation: With patient Time For Goal Achievement: 04/27/24 Potential to Achieve Goals: Good    Frequency Min 3X/week     Co-evaluation               AM-PAC PT 6 Clicks Mobility  Outcome Measure Help needed turning from your back to your side while in a flat bed without  using bedrails?: None Help needed moving from lying on your back to sitting on the side of a flat bed without using bedrails?: None Help needed moving to and from a bed to a chair (including a wheelchair)?: A Little Help needed standing up from a chair using your arms (e.g., wheelchair or bedside chair)?: A Little Help needed to walk in hospital room?: A Little Help needed climbing 3-5 steps with a railing? : A Little 6 Click Score: 20    End of Session   Activity Tolerance: Patient tolerated treatment well Patient left: in chair;with call bell/phone within reach;with chair alarm set;with family/visitor present Nurse Communication: Mobility status;Other (comment) (incontinent of urine) PT Visit Diagnosis: Unsteadiness on feet (R26.81);Difficulty in walking, not elsewhere classified (R26.2)    Time: 8850-8785 PT Time Calculation (min) (ACUTE ONLY): 25 min   Charges:   PT Evaluation $PT Eval Low Complexity: 1 Low PT Treatments $Gait Training: 8-22 mins PT General Charges $$ ACUTE PT VISIT: 1 Visit          Beth Macdonald, PT Acute Rehabilitation Services  Office [phone]   Beth Macdonald 04/13/2024, 12:39 PM

## 2024-04-13 NOTE — Progress Notes (Signed)
 BLE venous exam is completed. Bronda Alfred, RVT

## 2024-04-13 NOTE — Progress Notes (Signed)
 STROKE TEAM PROGRESS NOTE    INTERIM HISTORY/SUBJECTIVE No family at the bedside.  Patient lying comfortably in bed.  She is awake and interactive but is poor awareness of her condition.  Daughter is at the bedside.  CBC    Component Value Date/Time   WBC 6.3 04/13/2024 0626   RBC 4.10 04/13/2024 0626   HGB 12.7 04/13/2024 0626   HCT 37.8 04/13/2024 0626   PLT 248 04/13/2024 0626   MCV 92.2 04/13/2024 0626   MCH 31.0 04/13/2024 0626   MCHC 33.6 04/13/2024 0626   RDW 13.4 04/13/2024 0626   LYMPHSABS 1.1 04/11/2024 1148   MONOABS 0.6 04/11/2024 1148   EOSABS 0.1 04/11/2024 1148   BASOSABS 0.1 04/11/2024 1148    BMET    Component Value Date/Time   NA 141 04/13/2024 0626   K 4.4 04/13/2024 0626   CL 107 04/13/2024 0626   CO2 26 04/13/2024 0626   GLUCOSE 119 (H) 04/13/2024 0626   BUN 16 04/13/2024 0626   CREATININE 1.27 (H) 04/13/2024 0626   CREATININE 1.04 12/25/2012 1609   CALCIUM  9.0 04/13/2024 0626   GFRNONAA 43 (L) 04/13/2024 0626    IMAGING past 24 hours VAS US  LOWER EXTREMITY VENOUS (DVT) Result Date: 04/13/2024  Lower Venous DVT Study Patient Name:  Beth Macdonald  Date of Exam:   04/13/2024 Medical Rec #: [id number]          Accession #:    [id number] Date of Birth: [birth year]          Patient Gender: F Patient Age:   78 years Exam Location:  [facility] Procedure:      VAS US  LOWER EXTREMITY VENOUS (DVT) Referring Phys: EKTA PATEL --------------------------------------------------------------------------------  Indications: Positive D-dimer.  Performing Technologist: Elmarie Lindau, RVT  Examination Guidelines: A complete evaluation includes B-mode imaging, spectral Doppler, color Doppler, and power Doppler as needed of all accessible portions of each vessel. Bilateral testing is considered an integral part of a complete examination. Limited examinations for reoccurring indications may be performed as noted. The reflux portion of the exam is performed with the  patient in reverse Trendelenburg.  +---------+---------------+---------+-----------+----------+--------------+ RIGHT    CompressibilityPhasicitySpontaneityPropertiesThrombus Aging +---------+---------------+---------+-----------+----------+--------------+ CFV      Full           Yes      Yes                                 +---------+---------------+---------+-----------+----------+--------------+ SFJ      Full                                                        +---------+---------------+---------+-----------+----------+--------------+ FV Prox  Full                                                        +---------+---------------+---------+-----------+----------+--------------+ FV Mid   Full                                                        +---------+---------------+---------+-----------+----------+--------------+  FV DistalFull                                                        +---------+---------------+---------+-----------+----------+--------------+ PFV      Full                                                        +---------+---------------+---------+-----------+----------+--------------+ POP      Full           Yes      Yes                                 +---------+---------------+---------+-----------+----------+--------------+ PTV      Full                                                        +---------+---------------+---------+-----------+----------+--------------+ PERO     Full                                                        +---------+---------------+---------+-----------+----------+--------------+   +---------+---------------+---------+-----------+----------+--------------+ LEFT     CompressibilityPhasicitySpontaneityPropertiesThrombus Aging +---------+---------------+---------+-----------+----------+--------------+ CFV      Full           Yes      Yes                                  +---------+---------------+---------+-----------+----------+--------------+ SFJ      Full                                                        +---------+---------------+---------+-----------+----------+--------------+ FV Prox  Full                                                        +---------+---------------+---------+-----------+----------+--------------+ FV Mid   Full                                                        +---------+---------------+---------+-----------+----------+--------------+ FV DistalFull                                                        +---------+---------------+---------+-----------+----------+--------------+  PFV      Full                                                        +---------+---------------+---------+-----------+----------+--------------+ POP      Full           Yes      Yes                                 +---------+---------------+---------+-----------+----------+--------------+ PTV      Full                                                        +---------+---------------+---------+-----------+----------+--------------+ PERO     Full                                                        +---------+---------------+---------+-----------+----------+--------------+     Summary: RIGHT: - There is no evidence of deep vein thrombosis in the lower extremity.  - No cystic structure found in the popliteal fossa.  LEFT: - There is no evidence of deep vein thrombosis in the lower extremity.  - No cystic structure found in the popliteal fossa.  *See table(s) above for measurements and observations.    Preliminary    NM Pulmonary Perfusion Result Date: 04/12/2024 CLINICAL DATA:  Pulmonary embolism suspected, high probability. EXAM: NUCLEAR MEDICINE PERFUSION LUNG SCAN TECHNIQUE: Perfusion images were obtained in multiple projections after intravenous injection of radiopharmaceutical. Ventilation scans intentionally  deferred if perfusion scan and chest x-ray adequate for interpretation during COVID 19 epidemic. RADIOPHARMACEUTICALS:  4.0 mCi Tc-67m MAA IV COMPARISON:  Chest radiograph 04/12/2024. FINDINGS: Two or three vague small photopenic defects in the lung periphery on the oblique views. Perfusion imaging is otherwise within normal limits. IMPRESSION: Negative for pulmonary embolism. Electronically Signed   By: Newell Eke M.D.   On: 04/12/2024 16:18   DG CHEST PORT 1 VIEW Result Date: 04/12/2024 CLINICAL DATA:  141700 Pulmonary embolism (HCC) 141700 EXAM: PORTABLE CHEST - 1 VIEW COMPARISON:  November 14, 2022 FINDINGS: No focal airspace consolidation, pleural effusion, or pneumothorax. No cardiomegaly. Cardiac loop recorder device. Aortic atherosclerosis. No acute fracture or destructive lesions. Multilevel thoracic osteophytosis. IMPRESSION: No acute cardiopulmonary abnormality. Electronically Signed   By: Rogelia Myers M.D.   On: 04/12/2024 15:29    Vitals:   04/13/24 1131 04/13/24 1305 04/13/24 1400 04/13/24 1436  BP: (!) 182/60 (!) 180/80 (!) 141/58 135/80  Pulse: 61 62 75 76  Resp: 16 19 19 17   Temp: 97.8 F (36.6 C)   98.1 F (36.7 C)  TempSrc: Oral   Oral  SpO2: 97% 97% 96% 96%  Weight:      Height:         PHYSICAL EXAM General:  Alert, well-nourished, well-developed patient in no acute distress Psych:  Mood and affect appropriate for situation CV: Regular rate and rhythm on monitor Respiratory:  Regular, unlabored respirations  on room air GI: Abdomen soft and nontender   NEURO:  Mental Status: AA&Ox3, patient is able to give clear and coherent history Speech/Language: speech is without aphasia.  Mild dysarthria naming, repetition, fluency, and comprehension intact.  Poor insight into her condition.  Diminished attention and station recall.  Cranial Nerves:  II: PERRL. Visual fields full.  III, IV, VI: EOMI. Eyelids elevate symmetrically.  V: Sensation is intact to light  touch and symmetrical to face.  VII: Right facial droop VIII: hearing intact to voice. IX, X: Palate elevates symmetrically. Phonation is normal.  KP:Dynloizm shrug 5/5. XII: tongue is midline without fasciculations. Motor: 5/5 strength to all muscle groups tested.  Tone: is normal and bulk is normal Sensation- Intact to light touch bilaterally. Extinction absent to light touch to DSS.   Coordination: FTN intact bilaterally, HKS: no ataxia in BLE.No drift.  Gait- deferred  Most Recent NIH  1a Level of Conscious.:  1b LOC Questions:  1c LOC Commands:  2 Best Gaze:  3 Visual:  4 Facial Palsy: 2 5a Motor Arm - left:  5b Motor Arm - Right:  6a Motor Leg - Left:  6b Motor Leg - Right:  7 Limb Ataxia:  8 Sensory:  9 Best Language:  10 Dysarthria: 1 11 Extinct. and Inatten.:  TOTAL: 3   ASSESSMENT/PLAN  [name] is a 77 y.o. female with history of  recent left frontal infarct likely due to large vessel intracranial atherosclerosis, dementia, diabetes, diabetic neuropathy, DVT, Bell's palsy with right-sided residual weakness, a prior stroke more remote than a month ago with residual left-sided weakness and balance difficulties along with history of TIAs, presents for evaluation from urgent care for concern for stroke.   NIH on Admission 5  Acute Ischemic Infarct:  left caudate with mild hemorrhage  Etiology: Severe intracranial stenosis Code Stroke CT head No acute intracranial abnormality. 2. Remote infarcts as above. 3. Chronic small vessel disease and generalized parenchymal volume loss. CTA head & neck  Critical stenosis versus occlusion of the left P1 and P2 segments of the posterior cerebral artery. 2. 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 3. Moderate to severe stenoses of M2 and M3 branches of the left middle cerebral artery. MRI  Acute versus subacute infarct in the left caudate with mild associated hemorrhage. No mass  effect or midline shift. Recommend short interval follow up head CT. 2. Remote infarcts, chronic microvascular ischemic changes, and parenchymal volume loss. Repeat CT head 1. Acute/subacute left periventricular white matter infarct. 2. Stable chronic anterior left frontaloparietal encephalomalacia and remote infarct of the right occipital lobe. 2D Echo (03/15/24) EF 60-65%. LV with grade I diastolic dysfunction  LDL 30 HgbA1c 8.4 VTE prophylaxis - SCD's aspirin  81 mg daily and clopidogrel  75 mg daily prior to admission, now on No antithrombotic, will hold in the setting of mild hemorrhage.  Consider restarting on discharge for 3 months then Plavix  alone Therapy recommendations:  Pending Disposition: Pending  Hx of Stroke/TIA Dementia July 2025 recent left frontal infarct with etiology due to large vessel intracranial stenosis Was discharged on aspirin  and Plavix  for 3 months then Plavix  alone.  Currently on hold due to hemorrhagic transformation.  Consider restarting on discharge TIA 10-2018  Right ACA territory infarct in 2023 On home Aricept   Hypertension Home meds:  lisinopril  10mg ,  Stable Blood Pressure Goal: SBP less than 160   Hyperlipidemia Home meds:  repatha  and atorvastatin  20mg  ,  resumed in hospital LDL 30, goal < 70 Continue statin at discharge  Diabetes type II UncControlled Home meds:  metformin , semaglutide   HgbA1c 8.4, goal < 7.0 CBGs SSI Recommend close follow-up with PCP for better DM control  Dysphagia Patient has post-stroke dysphagia, SLP consulted    Diet   Diet Carb Modified Fluid consistency: Thin; Room service appropriate? Yes with Assist   Advance diet as tolerated   Other Active Problems History of DVT History of Bell's palsy Diabetic neuropathy  Hospital day # 1   .  Patient well-known to me from previous strokes and mixed dementia.  She presented with facial weakness and MRI scan shows new left frontal subcortical infarct.  CT  angiogram showed multivessel intracranial stenosis.  Recommend aspirin  and Plavix  for 3 months and then Plavix  alone.  Continue Aricept  for dementia.  Mobilize out of bed.  Therapy consults.  Follow-up as an outpatient with stroke clinic.  Discussed with Dr. Jacalyn Eather Popp, MD Medical Director [facility] Pager: [phone] 04/13/2024 3:11 PM   To contact Stroke Continuity provider, please refer to [email or web address]. After hours, contact General Neurology

## 2024-04-13 NOTE — Discharge Summary (Addendum)
 Physician Discharge Summary  Alechia Lezama Kramar FMW:[id number] DOB: [birth year] DOA: 04/11/2024  PCP: Clarice Nottingham, MD  Admit date: 04/11/2024 Discharge date: 04/13/2024 Recommendations for Outpatient Follow-up:  Follow up with PCP in 1 weeks-call for appointment Please obtain BMP/CBC in one week  Discharge Dispo: home w/ hh Discharge Condition: Stable Code Status:   Code Status: Full Code Diet recommendation:  Diet Order             Diet Carb Modified Fluid consistency: Thin; Room service appropriate? 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 In the ED afebrile hemodynamically stable.  Labs with creatinine 1.4 stable CBC LDL 30,UA unremarkable glucose 144 Patient was admitted to rule out a stroke and for further workup.  Neurology was consulted At this point complete stroke workup see below, Antiplatelet recommendation as per neurology see below  Subjective: Seen and examined Patient doing fairly well no new complaints Overnight blood pressure variable from 120s to 180s systolic Labs reviewed creatinine slightly better 1.2 normal CBC cbg 103  Discharge diagnoses: Acute ischemic stroke w/ hemorrhagic conversion-left caudate with mild hemorrhage Recent stroke in July/08/2024 with residual left-sided weakness 2/2 large vessel atherosclerosis: Patient underwent extensive workup recently, current workup as below- MRI Brain: Acute  versus subacute infarct left caudate with mild hemorrhage no mass effect, remote infarcts chronic microvascular ischemic changes> follow-up CT head this morning with acute/subacute left periventricular white matter infarct stable chronic anterior left frontoparietal encephalomalacia and remote infarct right occipital lobe CTA head and neck: Not performed as patient not having symptoms of large vessel occlusion Echocardiogram: Completed on July/13/25 EF 60 to 65% no RWMA G1 DD AV tricuspid MV normal no atrial level shunt detected, Recently she was on aspirin /Plavix  from July 14- w/ plan to continue for 3 months then Plavix  alone Now No antiplatelets or anticoagulants due to hemorrhagic transformation> await final recommendation from neurology-who plans to resume  as planned before w/ 3 mo DAPT then Plavix  alone. Resume home lisinopril , continue home Lipitor 20 mg. LDL is at 30 goal<70, A1c 8.4 in July,goal< 7-continue risk factor modification EEG shows mild diffuse encephalopathy.  UA without evidence of UTI. Cont PT PT OT speech follow-up As previously planned states outpatient follow-up with [facility] in 12 weeks.  Elevated D-dimer 3.8: Workup shows negative VQ scan.  Duplex is pending.  Less likely reason for recurrent stroke given no PFO on her last echo AND her stroke was felt to be due to large vessel atherosclerosis last time   Baseline dementia: Alert very pleasant continue Aricept  aaox2 recent decline.  Recently seen by neurology she does have decline in her memory and cognition.  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  Avoid nephrotoxins, monitor renal function Recent Labs    03/14/24 1239 03/14/24 2136 03/15/24 0632 04/11/24 1148 04/11/24 1154 04/12/24 0807 04/13/24 0626  BUN 26*  --  22 26* 28* 22 16  CREATININE 1.65* 1.37* 1.29* 1.42* 1.50* 1.34* 1.27*  CO2 25  --  21* 21*  --  24 26  K 4.2  --  3.9 4.2 4.2 4.4 4.4    Hypomagnesemia  1.2 on admission: Replaced and now resolved   Hyperlipidemia: PTA on Lipitor and also takes outpatient Repatha .Continue home regimen  Type 2 diabetes w/ neuropathy: PTA on metformin  and semaglutide , A1c recently on 8.  Currently well-controlled on sliding scale continue CBG ACHS Recent Labs  Lab 04/12/24 1255 04/12/24 1759 04/12/24 2110 04/13/24 0732 04/13/24 1128  GLUCAP 137* 137* 103* 103* 126*    OA PT OT.  Symptomatic management  Essential hypertension: PTA on lisinopril  resumed and dose doubled to 20 mg-follow-up with PCP to adjust medication if blood pressure not at goal for SBP goal < 160  Mobility: PT Orders: Active PT Follow up Rec: Home Health Pt8/07/2024 1228   DVT prophylaxis: enoxaparin  (LOVENOX ) injection 40 mg Start: 04/12/24 1645 Code Status:   Code Status: Full Code Family Communication: plan of care discussed with patient/spouse at bedside. Patient status is: Remains hospitalized because of severity of illness Level of care: Telemetry Medical   Dispo: The patient is from: home with spouse            Anticipated disposition: HOME W/ HH Objective: Vitals last 24 hrs: Vitals:   04/13/24 0733 04/13/24 1131 04/13/24 1305 04/13/24 1400  BP: (!) 188/86 (!) 182/60 (!) 180/80 (!) 141/58  Pulse: 64 61 62 75  Resp: 20 16 19 19   Temp: 97.8 F (36.6 C) 97.8 F (36.6 C)    TempSrc: Oral Oral    SpO2: 92% 97% 97% 96%  Weight:      Height:        Physical Examination: General exam: Alert oriented to self place  HEENT:Oral mucosa moist, Ear/Nose WNL grossly Respiratory system: Bilaterally clear BS,no use of accessory muscle Cardiovascular system: S1 & S2 +, No JVD. Gastrointestinal system: Abdomen soft,NT,ND, BS+ Nervous System: Alert, awake, pleasantly confused but able to interact. Rt facial weakness chronic. Extremities: LE edema neg, distal extremities warm.  Skin: No rashes,no icterus. MSK: Normal muscle bulk,tone, power   Medications reviewed:   Scheduled Meds:   stroke: early stages of recovery book   Does not apply Once   atorvastatin   20 mg Oral Daily   donepezil   5 mg Oral QHS   enoxaparin  (LOVENOX ) injection  40 mg Subcutaneous Daily   feeding supplement (KATE FARMS STANDARD 1.4)  325 mL Oral BID BM   insulin  aspart  0-15 Units Subcutaneous TID WC   lisinopril   10 mg Oral Daily   multivitamin with minerals  1 tablet Oral Daily   sodium chloride  flush  3 mL Intravenous Once   Continuous Infusions:  sodium chloride  Stopped (04/13/24 1235)   Diet: Diet Order             Diet Carb Modified Fluid consistency: Thin; Room service appropriate? Yes with Assist  Diet effective now           Diet Carb Modified                     Consultation: See note.  Discharge Instructions  Discharge Instructions     Ambulatory referral to Neurology   Complete by: As directed    An appointment is requested in approximately: 8 weeks   Diet Carb Modified   Complete by: As directed  Discharge instructions   Complete by: As directed    Please call call MD or return to ER for similar or worsening recurring problem that brought you to hospital or if any fever,nausea/vomiting,abdominal pain, uncontrolled pain, chest pain,  shortness of breath or any other alarming symptoms.  Please follow-up your doctor as instructed in a week time and call the office for appointment.  Please avoid alcohol, smoking, or any other illicit substance and maintain healthy habits including taking your regular medications as prescribed.  You were cared for by a hospitalist during your hospital stay. If you have any questions about your discharge medications or the care you received while you were in the hospital after you are discharged, you can call the unit and ask to speak with the hospitalist on call if the hospitalist that took care of you is not available.  Once you are discharged, your primary care physician will handle any further medical  issues. Please note that NO REFILLS for any discharge medications will be authorized once you are discharged, as it is imperative that you return to your primary care physician (or establish a relationship with a primary care physician if you do not have one) for your aftercare needs so that they can reassess your need for medications and monitor your lab values   Face-to-face encounter (required for Medicare/Medicaid patients)   Complete by: As directed    I Mennie LAMY certify that this patient is under my care and that I, or a nurse practitioner or physician's assistant working with me, had a face-to-face encounter that meets the physician face-to-face encounter requirements with this patient on 04/13/2024. The encounter with the patient was in whole, or in part for the following medical condition(s) which is the primary reason for home health care (List medical condition): Deconditioning, stroke   The encounter with the patient was in whole, or in part, for the following medical condition, which is the primary reason for home health care: Deconditioning, stroke   I certify that, based on my findings, the following services are medically necessary home health services: Physical therapy   Reason for Medically Necessary Home Health Services: Therapy- Therapeutic Exercises to Increase Strength and Endurance   My clinical findings support the need for the above services: Cognitive impairments, dementia, or mental confusion  that make it unsafe to leave home   Further, I certify that my clinical findings support that this patient is homebound due to: Mental confusion   Home Health   Complete by: As directed    To provide the following care/treatments:  PT OT     Increase activity slowly   Complete by: As directed       Allergies as of 04/13/2024       Reactions   Clindamycin/lincomycin Other (See Comments)   Cannot tolerate mycins causes C-Diff   Erythromycin    Other Reaction(s): Not  available erythromycin   Milk-related Compounds Diarrhea   Tetracyclines & Related Other (See Comments)   Feel really nervous        Medication List     TAKE these medications    aspirin  EC 81 MG tablet Take 1 tablet (81 mg total) by mouth daily. Swallow whole.   atorvastatin  20 MG tablet Commonly known as: LIPITOR Take 1 tablet (20 mg total) by mouth daily. Hold until you are taking Paxlovid    Clobetasol  Prop Emollient Base 0.05 % emollient cream Commonly known as: Clobetasol  Propionate E Apply 1 Application topically 2 (two) times daily.  What changed:  when to take this reasons to take this   clopidogrel  75 MG tablet Commonly known as: PLAVIX  Take 1 tablet (75 mg total) by mouth daily.   cycloSPORINE  0.05 % ophthalmic emulsion Commonly known as: RESTASIS  Place 1 drop into both eyes 2 (two) times daily as needed.   desonide  0.05 % cream Commonly known as: DESOWEN  Apply topically 2 (two) times daily as needed (Rash).   donepezil  5 MG tablet Commonly known as: ARICEPT  Take 5 mg by mouth at bedtime.   feeding supplement (KATE FARMS STANDARD 1.4) Liqd liquid Take 325 mLs by mouth 2 (two) times daily between meals for 7 days.   lisinopril  20 MG tablet Commonly known as: ZESTRIL  Take 1 tablet (20 mg total) by mouth daily. What changed:  medication strength how much to take   metFORMIN  500 MG 24 hr tablet Commonly known as: GLUCOPHAGE -XR Take 1,000 mg by mouth 2 (two) times daily with a meal.   Repatha  SureClick 140 MG/ML Soaj Generic drug: Evolocumab  Inject 140 mg into the skin every 14 (fourteen) days. Sunday   Semaglutide (0.25 or 0.5MG /DOS) 2 MG/3ML Sopn Inject 0.5 mg into the skin once a week.   SYSTANE FREE OP Apply 1 drop to eye daily as needed (dryness/irritation).   Vitamin D3 50 MCG (2000 UT) capsule Take 2,000 Units by mouth daily.        Follow-up Information     [facility]. Schedule an appointment as  soon as possible for a visit in 2 month(s).   Specialty: Neurology Why: Please call make an appointment for 8 weeks after discharge Contact information: [address] [place]  [address] [phone]        Clarice Nottingham, MD Follow up in 1 week(s).   Specialty: Internal Medicine Contact information: [address] [place] [address] [place] [address] [phone]         Care, [facility] Follow up.   Specialty: Home Health Services Why: Services to resume - HHPT/OT- they will contact you to schedule Contact information: [address] [place] [address] [phone]                Allergies  Allergen Reactions   Clindamycin/Lincomycin Other (See Comments)    Cannot tolerate mycins causes C-Diff   Erythromycin     Other Reaction(s): Not available  erythromycin   Milk-Related Compounds Diarrhea   Tetracyclines & Related Other (See Comments)    Feel really nervous    The results of significant diagnostics from this hospitalization (including imaging, microbiology, ancillary and laboratory) are listed below for reference.    Microbiology: No results found for this or any previous visit (from the past 240 hours).  Procedures/Studies: VAS US  LOWER EXTREMITY VENOUS (DVT) Result Date: 04/13/2024  Lower Venous DVT Study Patient Name:  Beth Macdonald  Date of Exam:   04/13/2024 Medical Rec #: [id number]          Accession #:    [id number] Date of Birth: 11/02/1945          Patient Gender: F Patient Age:   78 years Exam Location:  [facility] Procedure:      VAS US  LOWER EXTREMITY VENOUS (DVT) Referring Phys: EKTA PATEL --------------------------------------------------------------------------------  Indications: Positive D-dimer.  Performing Technologist: Elmarie Lindau, RVT  Examination Guidelines: A complete evaluation includes B-mode imaging, spectral Doppler, color Doppler, and power Doppler as needed of all  accessible portions of each vessel. Bilateral  testing is considered an integral part of a complete examination. Limited examinations for reoccurring indications may be performed as noted. The reflux portion of the exam is performed with the patient in reverse Trendelenburg.  +---------+---------------+---------+-----------+----------+--------------+ RIGHT    CompressibilityPhasicitySpontaneityPropertiesThrombus Aging +---------+---------------+---------+-----------+----------+--------------+ CFV      Full           Yes      Yes                                 +---------+---------------+---------+-----------+----------+--------------+ SFJ      Full                                                        +---------+---------------+---------+-----------+----------+--------------+ FV Prox  Full                                                        +---------+---------------+---------+-----------+----------+--------------+ FV Mid   Full                                                        +---------+---------------+---------+-----------+----------+--------------+ FV DistalFull                                                        +---------+---------------+---------+-----------+----------+--------------+ PFV      Full                                                        +---------+---------------+---------+-----------+----------+--------------+ POP      Full           Yes      Yes                                 +---------+---------------+---------+-----------+----------+--------------+ PTV      Full                                                        +---------+---------------+---------+-----------+----------+--------------+ PERO     Full                                                        +---------+---------------+---------+-----------+----------+--------------+   +---------+---------------+---------+-----------+----------+--------------+  LEFT     CompressibilityPhasicitySpontaneityPropertiesThrombus Aging +---------+---------------+---------+-----------+----------+--------------+ CFV  Full           Yes      Yes                                 +---------+---------------+---------+-----------+----------+--------------+ SFJ      Full                                                        +---------+---------------+---------+-----------+----------+--------------+ FV Prox  Full                                                        +---------+---------------+---------+-----------+----------+--------------+ FV Mid   Full                                                        +---------+---------------+---------+-----------+----------+--------------+ FV DistalFull                                                        +---------+---------------+---------+-----------+----------+--------------+ PFV      Full                                                        +---------+---------------+---------+-----------+----------+--------------+ POP      Full           Yes      Yes                                 +---------+---------------+---------+-----------+----------+--------------+ PTV      Full                                                        +---------+---------------+---------+-----------+----------+--------------+ PERO     Full                                                        +---------+---------------+---------+-----------+----------+--------------+     Summary: RIGHT: - There is no evidence of deep vein thrombosis in the lower extremity.  - No cystic structure found in the popliteal fossa.  LEFT: - There is no evidence of deep vein thrombosis in the lower extremity.  - No cystic structure found in the popliteal fossa.  *See table(s) above for measurements and observations.  Preliminary    NM Pulmonary Perfusion Result Date: 04/12/2024 CLINICAL DATA:  Pulmonary  embolism suspected, high probability. EXAM: NUCLEAR MEDICINE PERFUSION LUNG SCAN TECHNIQUE: Perfusion images were obtained in multiple projections after intravenous injection of radiopharmaceutical. Ventilation scans intentionally deferred if perfusion scan and chest x-ray adequate for interpretation during COVID 19 epidemic. RADIOPHARMACEUTICALS:  4.0 mCi Tc-30m MAA IV COMPARISON:  Chest radiograph 04/12/2024. FINDINGS: Two or three vague small photopenic defects in the lung periphery on the oblique views. Perfusion imaging is otherwise within normal limits. IMPRESSION: Negative for pulmonary embolism. Electronically Signed   By: Newell Eke M.D.   On: 04/12/2024 16:18   DG CHEST PORT 1 VIEW Result Date: 04/12/2024 CLINICAL DATA:  141700 Pulmonary embolism (HCC) 141700 EXAM: PORTABLE CHEST - 1 VIEW COMPARISON:  November 14, 2022 FINDINGS: No focal airspace consolidation, pleural effusion, or pneumothorax. No cardiomegaly. Cardiac loop recorder device. Aortic atherosclerosis. No acute fracture or destructive lesions. Multilevel thoracic osteophytosis. IMPRESSION: No acute cardiopulmonary abnormality. Electronically Signed   By: Rogelia Myers M.D.   On: 04/12/2024 15:29   CT HEAD POST STROKE FOLLOWUP/TIMED/STAT READ Result Date: 04/12/2024 EXAM: CT HEAD WITHOUT 04/12/2024 06:09:00 AM TECHNIQUE: CT of the head was performed without the administration of intravenous contrast. Automated exposure control, iterative reconstruction, and/or weight based adjustment of the mA/kV was utilized to reduce the radiation dose to as low as reasonably achievable. COMPARISON: CT head without contrast 04/11/2024. MR head without contrast 04/11/2024. CLINICAL HISTORY: Neuro deficit, acute, stroke suspected. FINDINGS: BRAIN AND VENTRICLES: No acute intracranial hemorrhage. No mass effect or midline shift. No extra-axial fluid collection. Gray-white differentiation is maintained. No hydrocephalus. Acute/subacute left  periventricular white matter infarct is again noted. Chronic anterior left frontoparietal encephalomalacia is stable. A remote infarct of the right occipital lobe is stable. ORBITS: Anterior lens replacement. SINUSES AND MASTOIDS: A polyp or mucous retention cyst is present in the right maxillary sinus. SOFT TISSUES AND SKULL: No acute skull fracture. No acute soft tissue abnormality. VASCULATURE: Atherosclerotic calcifications are present in the cavernous carotid arteries bilaterally. No hyperdense vessel is present. IMPRESSION: 1. Acute/subacute left periventricular white matter infarct. 2. Stable chronic anterior left frontaloparietal encephalomalacia and remote infarct of the right occipital lobe. Electronically signed by: Lonni Necessary MD 04/12/2024 06:24 AM EDT RP Workstation: [id number]   MR BRAIN WO CONTRAST Result Date: 04/11/2024 EXAM: MRI BRAIN WITHOUT CONTRAST 04/11/2024 01:48:56 PM TECHNIQUE: Multiplanar multisequence MRI of the head/brain was performed without the administration of intravenous contrast. COMPARISON: None available. CLINICAL HISTORY: Neuro deficit, acute, stroke suspected. FINDINGS: BRAIN AND VENTRICLES: There is a small focus of restrictive diffusion within the left caudate concerning for focus of acute infarct. Within this region there is signal abnormality and susceptibility concerning for associated hemorrhage. No significant surrounding edema. No mass effect. Redemonstrated remote infarcts in the left frontal lobe, right occipital cortex, and right parietal lobe. Additional remote infarct in the right frontal lobe again noted. Generalized parenchymal volume loss. There are nonspecific hyperintense foci in the subcortical and periventricular white matter that most likely represent chronic microangiopathic ischemic changes in a patient of this age. Chronic microhemorrhage in the left frontal lobe. ORBITS: Bilateral lens replacement. SINUSES AND MASTOIDS: Mucous retention cyst  in the right maxillary sinus. BONES AND SOFT TISSUES: Normal marrow signal. No acute soft tissue abnormality. LIMITATIONS/ARTIFACTS: Examination is slightly limited due to motion artifact. IMPRESSION: 1. Acute versus subacute infarct in the left caudate with mild associated hemorrhage. No mass effect or midline shift. Recommend short interval follow  up head CT. 2. Remote infarcts, chronic microvascular ischemic changes, and parenchymal volume loss. 3. Findings messaged to Dr. Arora via the [facility] messaging system at 3:59PM on 04/11/24. Electronically signed by: Donnice Mania MD 04/11/2024 04:00 PM EDT RP Workstation: [id number]   EEG adult Result Date: 04/11/2024 Shelton Arlin KIDD, MD     04/11/2024  1:22 PM Patient Name: Beth Macdonald MRN: [id number] Epilepsy Attending: Arlin KIDD Shelton Referring Physician/Provider: Voncile Isles, MD Date: 04/11/2024 Duration: 25.26 mins Patient history: 78 y.o. female due to loss of balance and confusion. EEG to evaluate for seizure Level of alertness: Awake AEDs during EEG study: None Technical aspects: This EEG study was done with scalp electrodes positioned according to the 10-20 International system of electrode placement. Electrical activity was reviewed with band pass filter of 1-70Hz , sensitivity of 7 uV/mm, display speed of 39mm/sec with a 60Hz  notched filter applied as appropriate. EEG data were recorded continuously and digitally stored.  Video monitoring was available and reviewed as appropriate. Description: The posterior dominant rhythm consists of 8 Hz activity of moderate voltage (25-35 uV) seen predominantly in posterior head regions, symmetric and reactive to eye opening and eye closing. EEG showed intermittent generalized 3 to 6 Hz theta-delta slowing. Hyperventilation and photic stimulation were not performed.   ABNORMALITY - Intermittent slow, generalized IMPRESSION: This study is suggestive of mild diffuse encephalopathy. No seizures or epileptiform discharges  were seen throughout the recording. Arlin KIDD Shelton   CT HEAD CODE STROKE WO CONTRAST Result Date: 04/11/2024 EXAM: CT HEAD WITHOUT 04/11/2024 12:01:30 PM TECHNIQUE: CT of the head was performed without the administration of intravenous contrast. Automated exposure control, iterative reconstruction, and/or weight based adjustment of the mA/kV was utilized to reduce the radiation dose to as low as reasonably achievable. COMPARISON: MRI head dated 03/14/2024. CLINICAL HISTORY: Neuro deficit, acute, stroke suspected. Non con. Code stroke. Words finding difficulty; hx prior stroke. FINDINGS: BRAIN AND VENTRICLES: No acute intracranial hemorrhage. No mass effect or midline shift. No extra-axial fluid collection. Gray-white differentiation is maintained. No hydrocephalus. Redemonstrated encephalomalacia in the left frontal lobe compatible with remote infarct. Additional remote cortical infarct in the right occipital lobe and in the high right parietal lobe near the vertex. Nonspecific hypoattenuation in the periventricular and subcortical white matter, most likely representing chronic small vessel disease. Generalized parenchymal volume loss. ORBITS: No acute abnormality. SINUSES AND MASTOIDS: Mucous retention cyst in the right maxillary sinus. SOFT TISSUES AND SKULL: No acute skull fracture. No acute soft tissue abnormality. Bilateral lens replacement. [place] stroke program early CT (ASPECT) score: Ganglionic (caudate, IC, lentiform nucleus, insula, M1-M3): 7 Supraganglionic (M4-M6): 3 Total: 10 IMPRESSION: 1. No acute intracranial abnormality. 2. Remote infarcts as above. 3. Chronic small vessel disease and generalized parenchymal volume loss. 4. Findings messaged to Dr. Arora via the [facility] messaging system at 12:13PM on 04/11/24. Electronically signed by: Donnice Mania MD 04/11/2024 12:15 PM EDT RP Workstation: [id number]   CUP PACEART REMOTE DEVICE CHECK Result Date: 04/09/2024 ILR summary report received. Battery  status OK. Normal device function. No new symptom, brady, or pause episodes. No new AF episodes. 1 False Tachy detection on 03/14/24 at 15:12 due to artifact oversensing. Monthly summary reports and ROV/PRN. MC, CVRS  ECHOCARDIOGRAM COMPLETE Result Date: 03/15/2024    ECHOCARDIOGRAM REPORT   Patient Name:   Beth Macdonald Date of Exam: 03/15/2024 Medical Rec #:  [id number]         Height:       68.0 in Accession #:  [id number]        Weight:       174.2 lb Date of Birth:  1946/04/25         BSA:          1.927 m Patient Age:    77 years          BP:           143/60 mmHg Patient Gender: F                 HR:           74 bpm. Exam Location:  Inpatient Procedure: 2D Echo, 3D Echo, Color Doppler and Cardiac Doppler (Both Spectral            and Color Flow Doppler were utilized during procedure). Indications:    Stroke  History:        Patient has prior history of Echocardiogram examinations, most                 recent 06/05/2022. DVT and TIA; Risk Factors:Diabetes,                 Dyslipidemia and Current Smoker.  Sonographer:    Logan Shove RDCS Referring Phys: [id number] CAROLE N HALL IMPRESSIONS  1. Left ventricular ejection fraction, by estimation, is 60 to 65%. Left ventricular ejection fraction by 3D volume is 61 %. The left ventricle has normal function. The left ventricle has no regional wall motion abnormalities. Left ventricular diastolic  parameters are consistent with Grade I diastolic dysfunction (impaired relaxation).  2. Right ventricular systolic function is normal. The right ventricular size is normal. There is mildly elevated pulmonary artery systolic pressure. The estimated right ventricular systolic pressure is 36.4 mmHg.  3. The mitral valve is grossly normal. Trivial mitral valve regurgitation. No evidence of mitral stenosis.  4. The aortic valve is tricuspid. There is mild calcification of the aortic valve. Aortic valve regurgitation is not visualized. No aortic stenosis is present.  5. The  inferior vena cava is normal in size with greater than 50% respiratory variability, suggesting right atrial pressure of 3 mmHg. FINDINGS  Left Ventricle: Left ventricular ejection fraction, by estimation, is 60 to 65%. Left ventricular ejection fraction by 3D volume is 61 %. The left ventricle has normal function. The left ventricle has no regional wall motion abnormalities. The left ventricular internal cavity size was normal in size. There is borderline left ventricular hypertrophy. Left ventricular diastolic parameters are consistent with Grade I diastolic dysfunction (impaired relaxation). Right Ventricle: The right ventricular size is normal. No increase in right ventricular wall thickness. Right ventricular systolic function is normal. There is mildly elevated pulmonary artery systolic pressure. The tricuspid regurgitant velocity is 2.89  m/s, and with an assumed right atrial pressure of 3 mmHg, the estimated right ventricular systolic pressure is 36.4 mmHg. Left Atrium: Left atrial size was normal in size. Right Atrium: Right atrial size was normal in size. Pericardium: There is no evidence of pericardial effusion. Presence of epicardial fat layer. Mitral Valve: The mitral valve is grossly normal. Trivial mitral valve regurgitation. No evidence of mitral valve stenosis. Tricuspid Valve: The tricuspid valve is normal in structure. Tricuspid valve regurgitation is trivial. No evidence of tricuspid stenosis. Aortic Valve: The aortic valve is tricuspid. There is mild calcification of the aortic valve. Aortic valve regurgitation is not visualized. No aortic stenosis is present. Aortic valve mean gradient measures 5.0 mmHg. Aortic valve peak gradient measures 10.0 mmHg. Aortic  valve area, by VTI measures 2.30 cm. Pulmonic Valve: The pulmonic valve was normal in structure. Pulmonic valve regurgitation is trivial. No evidence of pulmonic stenosis. Aorta: The aortic root is normal in size and structure. Venous: The  inferior vena cava is normal in size with greater than 50% respiratory variability, suggesting right atrial pressure of 3 mmHg. IAS/Shunts: No atrial level shunt detected by color flow Doppler. Additional Comments: 3D was performed not requiring image post processing on an independent workstation and was normal.  LEFT VENTRICLE PLAX 2D LVIDd:         4.10 cm         Diastology LVIDs:         2.80 cm         LV e' medial:    3.92 cm/s LV PW:         1.00 cm         LV E/e' medial:  17.4 LV IVS:        0.90 cm         LV e' lateral:   6.85 cm/s LVOT diam:     1.90 cm         LV E/e' lateral: 10.0 LV SV:         85 LV SV Index:   44 LVOT Area:     2.84 cm        3D Volume EF                                LV 3D EF:    Left                                             ventricul                                             ar                                             ejection                                             fraction                                             by 3D                                             volume is                                             61 %.  3D Volume EF:                                3D EF:        61 %                                LV EDV:       128 ml                                LV ESV:       50 ml                                LV SV:        79 ml RIGHT VENTRICLE             IVC RV Basal diam:  3.10 cm     IVC diam: 2.40 cm RV S prime:     11.60 cm/s TAPSE (M-mode): 2.3 cm LEFT ATRIUM             Index        RIGHT ATRIUM           Index LA diam:        3.70 cm 1.92 cm/m   RA Area:     12.30 cm LA Vol (A2C):   88.9 ml 46.13 ml/m  RA Volume:   22.50 ml  11.68 ml/m LA Vol (A4C):   43.5 ml 22.57 ml/m LA Biplane Vol: 64.0 ml 33.21 ml/m  AORTIC VALVE AV Area (Vmax):    2.12 cm AV Area (Vmean):   2.33 cm AV Area (VTI):     2.30 cm AV Vmax:           158.00 cm/s AV Vmean:          107.000 cm/s AV VTI:            0.368 m AV Peak Grad:       10.0 mmHg AV Mean Grad:      5.0 mmHg LVOT Vmax:         118.00 cm/s LVOT Vmean:        87.900 cm/s LVOT VTI:          0.299 m LVOT/AV VTI ratio: 0.81  AORTA Ao Root diam: 2.80 cm Ao Asc diam:  3.30 cm MITRAL VALVE                TRICUSPID VALVE MV Area (PHT): 3.03 cm     TR Peak grad:   33.4 mmHg MV Decel Time: 250 msec     TR Vmax:        289.00 cm/s MV E velocity: 68.30 cm/s MV A velocity: 124.00 cm/s  SHUNTS MV E/A ratio:  0.55         Systemic VTI:  0.30 m                             Systemic Diam: 1.90 cm Soyla Merck MD Electronically signed by Soyla Merck MD Signature Date/Time: 03/15/2024/2:47:07 PM    Final    CT ANGIO HEAD NECK W WO CM Result Date: 03/14/2024 CLINICAL DATA:  Stroke/TIA, determine embolic  source EXAM: CT ANGIOGRAPHY HEAD AND NECK WITH CONTRAST TECHNIQUE: Multidetector CT imaging of the head and neck was performed using the standard protocol during bolus administration of intravenous contrast. Multiplanar CT image reconstructions and MIPs were obtained to evaluate the vascular anatomy. Carotid stenosis measurements (when applicable) are obtained utilizing NASCET criteria, using the distal internal carotid diameter as the denominator. RADIATION DOSE REDUCTION: This exam was performed according to the departmental dose-optimization program which includes automated exposure control, adjustment of the mA and/or kV according to patient size and/or use of iterative reconstruction technique. CONTRAST:  60mL OMNIPAQUE  IOHEXOL  350 MG/ML SOLN COMPARISON:  CT of the head and MRI of the head dated March 14, 2024. FINDINGS: CTA NECK FINDINGS Aortic arch: Mild calcific plaque. Right carotid system: Mild calcific plaque present posteriorly within the proximal internal carotid artery. No stenosis. The common carotid and internal carotid arteries are unremarkable otherwise. Left carotid system: The common carotid artery is normal in caliber and unremarkable. There is mild to moderate calcific  plaque present proximally within the internal carotid artery, with less than 10% luminal stenosis. Normal caliber otherwise. Vertebral arteries: Normal in caliber and appearance throughout the respective courses. No stenosis. Skeleton: Mild to moderate degenerative changes within the cervical spine. Other neck: Negative. Upper chest: Clear lung apices. 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 There is mild stenosis of the distal M1 segment of the left middle cerebral artery. There is moderate focal stenosis of a sylvian M2 branch also present on image 106 of series 7. There is near occlusive stenosis of an M3 branch demonstrated on image 97. There is also near occlusive stenosis of the same branch on image 98. The anterior communicating arteries and their branches appear normal in caliber. Posterior circulation: Critical stenosis of the P1 and P2 segments of the left posterior cerebral artery is evident on image 110 of series 7. There is moderate stenosis of the P2 segment of the right posterior cerebral artery. The basilar artery is normal in caliber. The cerebellar arteries are patent. Venous sinuses: Patent. Anatomic variants: None. Review of the MIP images confirms the above findings IMPRESSION: 1. Critical stenosis versus occlusion of the left P1 and P2 segments of the posterior cerebral artery. 2. 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 3. Moderate to severe stenoses of M2 and M3 branches of the left middle cerebral artery. 4. Mild calcific atheromatous disease within the cervical internal carotid arteries without significant stenosis. Electronically Signed   By: Evalene Coho M.D.   On: 03/14/2024 18:02   MR BRAIN WO CONTRAST Result Date: 03/14/2024 CLINICAL DATA:  Neuro deficit, acute, stroke  suspected EXAM: MRI HEAD WITHOUT CONTRAST TECHNIQUE: Multiplanar, multiecho pulse sequences of the brain and surrounding structures were obtained without intravenous contrast. COMPARISON:  CT the head dated March 14, 2024. FINDINGS: Brain: There is restricted diffusion within the left middle frontal lobe compatible with acute nonhemorrhagic infarct. There are chronic encephalomalacia changes within the right occipital lobe and right medial parietal lobe. There is moderate periventricular white matter disease and mild subcortical white matter disease. Blooming artifact from likely prior hemorrhage is again noted medially within the left temporal lobe. There is no evidence of acute hemorrhage, mass or hydrocephalus. Vascular: Normal vascular flow voids. Skull and upper cervical spine: Normal marrow signal.  No osseous lesions. Sinuses/Orbits: Polypoid mucosal disease within the floor the right maxillary sinus. Status post bilateral lens replacement. Other: None. IMPRESSION: 1. Acute nonhemorrhagic infarct within the left mid frontal lobe. 2. Chronic encephalomalacia changes within the right occipital and right medial parietal lobes. Moderate periventricular white matter disease. Electronically Signed   By: Evalene Coho M.D.   On: 03/14/2024 16:43    Labs: BNP (last 3 results) No results for input(s): BNP in the last 8760 hours. Basic Metabolic Panel: Recent Labs  Lab 04/11/24 1148 04/11/24 1154 04/12/24 0807 04/13/24 0626  NA 139 139 138 141  K 4.2 4.2 4.4 4.4  CL 103 104 107 107  CO2 21*  --  24 26  GLUCOSE 144* 143* 127* 119*  BUN 26* 28* 22 16  CREATININE 1.42* 1.50* 1.34* 1.27*  CALCIUM  9.8  --  9.1 9.0  MG 1.2*  --  1.2* 1.9   Liver Function Tests: Recent Labs  Lab 04/11/24 1148  AST 25  ALT 18  ALKPHOS 33*  BILITOT 0.6  PROT 7.2  ALBUMIN  3.8   No results for input(s): LIPASE, AMYLASE in the last 168 hours. No results for input(s): AMMONIA in the last 168  hours. CBC: Recent Labs  Lab 04/11/24 1148 04/11/24 1154 04/13/24 0626  WBC 9.5  --  6.3  NEUTROABS 7.5  --   --   HGB 13.6 14.3 12.7  HCT 41.7 42.0 37.8  MCV 94.6  --  92.2  PLT 247  --  248   CBG: Recent Labs  Lab 04/12/24 1255 04/12/24 1759 04/12/24 2110 04/13/24 0732 04/13/24 1128  GLUCAP 137* 137* 103* 103* 126*   Hgb A1c No results for input(s): HGBA1C in the last 72 hours. Anemia work up No results for input(s): VITAMINB12, FOLATE, FERRITIN, TIBC, IRON, RETICCTPCT in the last 72 hours. Cardiac Enzymes: No results for input(s): CKTOTAL, CKMB, CKMBINDEX, TROPONINI in the last 168 hours. BNP: Invalid input(s): POCBNP D-Dimer Recent Labs    04/11/24 1953  DDIMER 3.81*   Lipid Profile Recent Labs    04/11/24 1148  CHOL 120  HDL 57  LDLCALC 30  TRIG 164*  CHOLHDL 2.1   Thyroid  function studies No results for input(s): TSH, T4TOTAL, T3FREE, THYROIDAB in the last 72 hours.  Invalid input(s): FREET3 Urinalysis    Component Value Date/Time   COLORURINE YELLOW 04/11/2024 1317   APPEARANCEUR CLEAR 04/11/2024 1317   LABSPEC 1.018 04/11/2024 1317   PHURINE 5.0 04/11/2024 1317   GLUCOSEU NEGATIVE 04/11/2024 1317   HGBUR NEGATIVE 04/11/2024 1317   BILIRUBINUR NEGATIVE 04/11/2024 1317   KETONESUR NEGATIVE 04/11/2024 1317   PROTEINUR NEGATIVE 04/11/2024 1317   NITRITE NEGATIVE 04/11/2024 1317   LEUKOCYTESUR NEGATIVE 04/11/2024 1317   Sepsis Labs Recent Labs  Lab 04/11/24 1148 04/13/24 0626  WBC 9.5 6.3   Microbiology No results found for this or any previous visit (from the past 240 hours).   Time coordinating discharge: 35  minutes  SIGNED: Mennie LAMY, MD  [facility] 04/13/2024, 2:35 PM  If 7PM-7AM, please contact night-coverage [email or web address]

## 2024-04-14 ENCOUNTER — Telehealth: Payer: Self-pay

## 2024-04-14 NOTE — Transitions of Care (Post Inpatient/ED Visit) (Signed)
   04/14/2024  Name: YUE FLANIGAN MRN: [id number] DOB: [birth year]  Today's TOC FU Call Status: Today's TOC FU Call Status:: Unsuccessful Call (1st Attempt) Unsuccessful Call (1st Attempt) Date: 04/14/24  Attempted to reach the patient regarding the most recent Inpatient/ED visit.  Follow Up Plan: Additional outreach attempts will be made to reach the patient to complete the Transitions of Care (Post Inpatient/ED visit) call.   Stachia Slutsky J. Cole Eastridge RN, MSN [facility], [facility] RN Care Manager Direct Dial: [phone]  Fax: [phone] Website: [email or web address]

## 2024-04-15 DIAGNOSIS — I69354 Hemiplegia and hemiparesis following cerebral infarction affecting left non-dominant side: Secondary | ICD-10-CM | POA: Diagnosis not present

## 2024-04-15 DIAGNOSIS — I69398 Other sequelae of cerebral infarction: Secondary | ICD-10-CM | POA: Diagnosis not present

## 2024-04-16 ENCOUNTER — Telehealth: Payer: Self-pay

## 2024-04-16 DIAGNOSIS — I69354 Hemiplegia and hemiparesis following cerebral infarction affecting left non-dominant side: Secondary | ICD-10-CM | POA: Diagnosis not present

## 2024-04-16 DIAGNOSIS — I69398 Other sequelae of cerebral infarction: Secondary | ICD-10-CM | POA: Diagnosis not present

## 2024-04-16 NOTE — Transitions of Care (Post Inpatient/ED Visit) (Signed)
 04/16/2024  Name: Beth Macdonald MRN: [id number] DOB: November 15, 1945  Today's TOC FU Call Status: Today's TOC FU Call Status:: Successful TOC FU Call Completed TOC FU Call Complete Date: 04/16/24 Patient's Name and Date of Birth confirmed.  Transition Care Management Follow-up Telephone Call Date of Discharge: 04/13/24 Discharge Facility: [facility]) Type of Discharge: Inpatient Admission Primary Inpatient Discharge Diagnosis:: Acute CVA How have you been since you were released from the hospital?: Better  Items Reviewed: Did you receive and understand the discharge instructions provided?: Yes Medications obtained,verified, and reconciled?: Yes (Medications Reviewed) Any new allergies since your discharge?: No Dietary orders reviewed?: Yes Type of Diet Ordered:: Low sodium, Carb modified Do you have support at home?: Yes People in Home [RPT]: spouse Name of Support/Comfort Primary Source: [name]  Medications Reviewed Today: Medications Reviewed Today     Reviewed by Dereon Corkery, RN (Case Manager) on 04/16/24 at 1459  Med List Status: <None>   Medication Order Taking? Sig Documenting Provider Last Dose Status Informant  aspirin  EC 81 MG tablet [id number] Yes Take 1 tablet (81 mg total) by mouth daily. Swallow whole. Christobal Guadalajara, MD  Active Spouse/Significant Other  atorvastatin  (LIPITOR) 20 MG tablet [id number] Yes Take 1 tablet (20 mg total) by mouth daily. Hold until you are taking Paxlovid  Amin, Sumayya, MD  Active Spouse/Significant Other  Cholecalciferol  (VITAMIN D3) 50 MCG (2000 UT) capsule [id number] Yes Take 2,000 Units by mouth daily. [provider]  Active Spouse/Significant Other  Clobetasol  Prop Emollient Base (CLOBETASOL  PROPIONATE E) 0.05 % emollient cream [id number]  Apply 1 Application topically 2 (two) times daily.  Patient taking differently: Apply 1 Application topically 2 (two) times daily as needed (for rash).   Sheffield, Kelli R, PA-C  Active  Spouse/Significant Other  clopidogrel  (PLAVIX ) 75 MG tablet [id number] Yes Take 1 tablet (75 mg total) by mouth daily. Christobal Guadalajara, MD  Active Spouse/Significant Other  cycloSPORINE  (RESTASIS ) 0.05 % ophthalmic emulsion [id number]  Place 1 drop into both eyes 2 (two) times daily as needed. [provider]  Active Spouse/Significant Other  desonide  (DESOWEN ) 0.05 % cream [id number]  Apply topically 2 (two) times daily as needed (Rash). Sheffield, Kelli R, PA-C  Active Spouse/Significant Other  donepezil  (ARICEPT ) 5 MG tablet [id number] Yes Take 5 mg by mouth at bedtime. [provider]  Active Spouse/Significant Other  lisinopril  (ZESTRIL ) 20 MG tablet [id number] Yes Take 1 tablet (20 mg total) by mouth daily. Christobal Guadalajara, MD  Active   metFORMIN  (GLUCOPHAGE -XR) 500 MG 24 hr tablet [id number] Yes Take 1,000 mg by mouth 2 (two) times daily with a meal. [provider]  Active Spouse/Significant Other  Nutritional Supplements (FEEDING SUPPLEMENT, KATE FARMS STANDARD 1.4,) LIQD liquid [id number]  Take 325 mLs by mouth 2 (two) times daily between meals for 7 days. Christobal Guadalajara, MD  Active   Polyethyl Glycol-Propyl Glycol (SYSTANE FREE OP) [id number] Yes Apply 1 drop to eye daily as needed (dryness/irritation). [provider]  Active Spouse/Significant Other  REPATHA  SURECLICK 140 MG/ML SOAJ [id number] Yes Inject 140 mg into the skin every 14 (fourteen) days. Sunday [provider]  Active Spouse/Significant Other  Semaglutide ,0.25 or 0.5MG /DOS, 2 MG/3ML SOPN [id number] Yes Inject 0.5 mg into the skin once a week.  Patient taking differently: Inject 1 mg into the skin once a week.   [provider]  Active Spouse/Significant Other           Med Note DINO, [facility]   Dju Mar 14, 2024  7:56 PM)              Home Care and Equipment/Supplies: Were Home Health Services Ordered?: Yes Name of Home Health Agency:: [facility] Has Agency set up a time to come  to your home?: Yes First Home Health Visit Date: 04/15/24 Any new equipment or medical supplies ordered?: No  Functional Questionnaire: Do you need assistance with bathing/showering or dressing?: Yes [name] as needed) Do you need assistance with meal preparation?: Yes [name] as needed) Do you need assistance with eating?: No Do you have difficulty maintaining continence: No Do you need assistance with getting out of bed/getting out of a chair/moving?: No Do you have difficulty managing or taking your medications?: Yes [name] as needed)  Follow up appointments reviewed: PCP Follow-up appointment confirmed?: Yes Date of PCP follow-up appointment?: 04/20/24 Follow-up Provider: Dr. Clarice Specialist [facility] Follow-up appointment confirmed?: No Reason Specialist Follow-Up Not Confirmed: Patient has Specialist Provider Number and will Call for Appointment Do you need transportation to your follow-up appointment?: No Do you understand care options if your condition(s) worsen?: Yes-patient verbalized understanding  SDOH Interventions Today    Flowsheet Row Most Recent Value  SDOH Interventions   Food Insecurity Interventions Intervention Not Indicated  Housing Interventions Intervention Not Indicated  Transportation Interventions Intervention Not Indicated  Utilities Interventions Intervention Not Indicated    Goals Addressed             This Visit's Progress    V[facility]I Transitions of Care (TOC) Care Plan       Problems:  Recent Hospitalization for treatment of CVA Knowledge Deficit Related to CVA  Goal:  Over the next 30 days, the patient will not experience hospital readmission  Interventions:  Transitions of Care: Doctor Visits  - discussed the importance of doctor visits  Stroke: Reviewed Importance of taking all medications as prescribed Reviewed Importance of attending all scheduled provider appointments Advised to report any changes in symptoms  or exercise tolerance Assessed for signs and symptoms of stroke Reviewed referrals to home health Assessed for management of bladder and/or bowel incontinence Assessed for cognitive impairment Assessed for fall status and safety in the home  Patient Self Care Activities:  Attend all scheduled provider appointments Call provider office for new concerns or questions  Notify RN Care Manager of TOC call rescheduling needs Participate in Transition of Care Program/Attend TOC scheduled calls Take medications as prescribed   Please monitor for symptoms: Sudden numbness or weakness in the face, arm, or leg, especially on one side of the body. Sudden confusion, trouble speaking, or difficulty understanding speech. Sudden trouble seeing in one or both eyes. Sudden trouble walking, dizziness, loss of balance, or lack of coordination. Sudden severe headache with no known cause. Call 9-1-1 right away if you or someone else has any of these symptoms.  Plan:  Telephone follow up appointment with care management team member scheduled for:  04/23/24 The patient has been provided with contact information for the care management team and has been advised to call with any health related questions or concerns.         Mishawn Hemann J. Salil Raineri RN, MSN [facility], [facility] RN Care Manager Direct Dial: [phone]  Fax: [phone] Website: [email or web address]

## 2024-04-16 NOTE — Patient Instructions (Signed)
 Visit Information  Thank you for taking time to visit with me today. Please don't hesitate to contact me if I can be of assistance to you before our next scheduled telephone appointment.  Our next appointment is by telephone on 04/23/24 at 2:00 pm  Following is a copy of your care plan:   Goals Addressed             This Visit's Progress    VBCI Transitions of Care (TOC) Care Plan       Problems:  Recent Hospitalization for treatment of CVA Knowledge Deficit Related to CVA  Goal:  Over the next 30 days, the patient will not experience hospital readmission  Interventions:  Transitions of Care: Doctor Visits  - discussed the importance of doctor visits  Stroke: Reviewed Importance of taking all medications as prescribed Reviewed Importance of attending all scheduled provider appointments Advised to report any changes in symptoms or exercise tolerance Assessed for signs and symptoms of stroke Reviewed referrals to home health Assessed for management of bladder and/or bowel incontinence Assessed for cognitive impairment Assessed for fall status and safety in the home  Patient Self Care Activities:  Attend all scheduled provider appointments Call provider office for new concerns or questions  Notify RN Care Manager of TOC call rescheduling needs Participate in Transition of Care Program/Attend TOC scheduled calls Take medications as prescribed   Please monitor for symptoms: Sudden numbness or weakness in the face, arm, or leg, especially on one side of the body. Sudden confusion, trouble speaking, or difficulty understanding speech. Sudden trouble seeing in one or both eyes. Sudden trouble walking, dizziness, loss of balance, or lack of coordination. Sudden severe headache with no known cause. Call 9-1-1 right away if you or someone else has any of these symptoms.  Plan:  Telephone follow up appointment with care management team member scheduled for:  04/23/24 The patient  has been provided with contact information for the care management team and has been advised to call with any health related questions or concerns.         Patient verbalizes understanding of instructions and care plan provided today and agrees to view in MyChart. Active MyChart status and patient understanding of how to access instructions and care plan via MyChart confirmed with patient.     The patient has been provided with contact information for the care management team and has been advised to call with any health related questions or concerns.   Please call the care guide team at [phone] if you need to cancel or reschedule your appointment.   Please call the Suicide and Crisis Lifeline: 988 if you are experiencing a Mental Health or Behavioral Health Crisis or need someone to talk to.  Elane Peabody J. Shavell Nored RN, MSN [facility], [facility] RN Care Manager Direct Dial: [phone]  Fax: [phone] Website: [email or web address]

## 2024-04-17 DIAGNOSIS — I69398 Other sequelae of cerebral infarction: Secondary | ICD-10-CM | POA: Diagnosis not present

## 2024-04-17 DIAGNOSIS — I69354 Hemiplegia and hemiparesis following cerebral infarction affecting left non-dominant side: Secondary | ICD-10-CM | POA: Diagnosis not present

## 2024-04-20 DIAGNOSIS — Z0000 Encounter for general adult medical examination without abnormal findings: Secondary | ICD-10-CM | POA: Diagnosis not present

## 2024-04-20 DIAGNOSIS — Z09 Encounter for follow-up examination after completed treatment for conditions other than malignant neoplasm: Secondary | ICD-10-CM | POA: Diagnosis not present

## 2024-04-20 DIAGNOSIS — Z8673 Personal history of transient ischemic attack (TIA), and cerebral infarction without residual deficits: Secondary | ICD-10-CM | POA: Diagnosis not present

## 2024-04-20 DIAGNOSIS — R413 Other amnesia: Secondary | ICD-10-CM | POA: Diagnosis not present

## 2024-04-20 DIAGNOSIS — I69354 Hemiplegia and hemiparesis following cerebral infarction affecting left non-dominant side: Secondary | ICD-10-CM | POA: Diagnosis not present

## 2024-04-20 DIAGNOSIS — I69398 Other sequelae of cerebral infarction: Secondary | ICD-10-CM | POA: Diagnosis not present

## 2024-04-20 DIAGNOSIS — I10 Essential (primary) hypertension: Secondary | ICD-10-CM | POA: Diagnosis not present

## 2024-04-21 DIAGNOSIS — I69354 Hemiplegia and hemiparesis following cerebral infarction affecting left non-dominant side: Secondary | ICD-10-CM | POA: Diagnosis not present

## 2024-04-21 DIAGNOSIS — I69398 Other sequelae of cerebral infarction: Secondary | ICD-10-CM | POA: Diagnosis not present

## 2024-04-22 DIAGNOSIS — I69398 Other sequelae of cerebral infarction: Secondary | ICD-10-CM | POA: Diagnosis not present

## 2024-04-22 DIAGNOSIS — R351 Nocturia: Secondary | ICD-10-CM | POA: Insufficient documentation

## 2024-04-22 DIAGNOSIS — I69354 Hemiplegia and hemiparesis following cerebral infarction affecting left non-dominant side: Secondary | ICD-10-CM | POA: Diagnosis not present

## 2024-04-22 DIAGNOSIS — E1165 Type 2 diabetes mellitus with hyperglycemia: Secondary | ICD-10-CM | POA: Diagnosis not present

## 2024-04-23 ENCOUNTER — Other Ambulatory Visit: Payer: Self-pay

## 2024-04-23 NOTE — Transitions of Care (Post Inpatient/ED Visit) (Signed)
 Transition of Care week 2  Visit Note  04/23/2024  Name: Beth Macdonald MRN: [id number]          DOB: Jun 23, 1946  Situation: Patient enrolled in [facility] 30-day program. Visit completed with spouse [name] by telephone.   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   Bridge of nose (in situ)   Skull fracture (HCC)    age 78   Squamous cell carcinoma of skin 03/22/2021   Right forehead (in situ)   TIA (transient ischemic attack) 2020   Varicose vein of leg     Assessment: Patient Reported Symptoms: Cognitive Cognitive Status: Alert and oriented to person, place, and time (spouse reports some issues with confusion)      Neurological Neurological Review of Symptoms: No symptoms reported    HEENT HEENT Symptoms Reported: No symptoms reported      Cardiovascular Cardiovascular Symptoms Reported: No symptoms reported Does patient have uncontrolled Hypertension?: Yes Is patient checking Blood Pressure at home?: Yes Patient's Recent BP reading at home: 134/71 most recent. Lisinopril  to be d/c'd. Advised to check blood pressure at least daily and record Cardiovascular Management Strategies: Diet modification, Routine screening Cardiovascular Self-Management Outcome: 3 (uncertain)  Respiratory Respiratory Symptoms Reported: No symptoms reported    Endocrine Endocrine Symptoms Reported: No symptoms reported Is patient diabetic?: Yes Is patient checking blood sugars at home?: No (Glucometer ordered by PCP)    Gastrointestinal Gastrointestinal Symptoms Reported:  Incontinence Additional Gastrointestinal Details: Episode of incontinence- discussed possiblity of being because of ozempic  and intake.      Genitourinary Genitourinary Symptoms Reported: No symptoms reported    Integumentary Integumentary Symptoms Reported: No symptoms reported    Musculoskeletal Musculoskelatal Symptoms Reviewed: Weakness Additional Musculoskeletal Details: Bayda Active Musculoskeletal Management Strategies: Exercise      Psychosocial Psychosocial Symptoms Reported: Not assessed Additional Psychological Details: spouse completes assessment         Vitals:   04/23/24 1454  BP: 134/71    Medications Reviewed Today     Reviewed by Jakeya Gherardi, RN (Case Manager) on 04/23/24 at 1436  Med List Status: <None>   Medication Order Taking? Sig Documenting Provider Last Dose Status Informant  aspirin  EC 81 MG tablet [id number] Yes Take 1 tablet (81 mg total) by mouth daily. Swallow whole. Christobal Guadalajara, MD  Active Spouse/Significant Other  atorvastatin  (LIPITOR) 20 MG tablet [id number] Yes Take 1 tablet (20 mg total) by mouth daily. Hold until you are taking Paxlovid  Amin, Sumayya, MD  Active Spouse/Significant Other  Cholecalciferol  (VITAMIN D3) 50 MCG (2000 UT) capsule [id number] Yes Take 2,000 Units by mouth daily. [provider]  Active Spouse/Significant Other  Clobetasol  Prop Emollient Base (CLOBETASOL  PROPIONATE E) 0.05 % emollient cream [id number] Yes Apply 1 Application topically 2 (two) times daily. Sheffield, Kelli R, PA-C  Active Spouse/Significant Other  clopidogrel  (PLAVIX ) 75 MG tablet [id number] Yes Take 1 tablet (75 mg total) by mouth daily. Christobal Guadalajara, MD  Active Spouse/Significant Other  cycloSPORINE  (RESTASIS ) 0.05 % ophthalmic emulsion [id number] Yes Place 1  drop into both eyes 2 (two) times daily as needed. [provider]  Active Spouse/Significant Other  desonide  (DESOWEN ) 0.05 % cream [id number] Yes Apply topically 2 (two) times daily as  needed (Rash). Sheffield, Kelli R, PA-C  Active Spouse/Significant Other  donepezil  (ARICEPT ) 5 MG tablet [id number] Yes Take 5 mg by mouth at bedtime.  Patient taking differently: Take 10 mg by mouth at bedtime.   [provider]  Active Spouse/Significant Other  lisinopril  (ZESTRIL ) 20 MG tablet [id number]  Take 1 tablet (20 mg total) by mouth daily.  Patient not taking: Reported on 04/23/2024   Christobal Guadalajara, MD  Active   metFORMIN  (GLUCOPHAGE -XR) 500 MG 24 hr tablet [id number] Yes Take 1,000 mg by mouth 2 (two) times daily with a meal. [provider]  Active Spouse/Significant Other  Polyethyl Glycol-Propyl Glycol (SYSTANE FREE OP) [id number] Yes Apply 1 drop to eye daily as needed (dryness/irritation). [provider]  Active Spouse/Significant Other  REPATHA  SURECLICK 140 MG/ML SOAJ [id number] Yes Inject 140 mg into the skin every 14 (fourteen) days. Sunday [provider]  Active Spouse/Significant Other  Semaglutide ,0.25 or 0.5MG /DOS, 2 MG/3ML SOPN [id number] Yes Inject 0.5 mg into the skin once a week.  Patient taking differently: Inject 1 mg into the skin once a week.   [provider]  Active Spouse/Significant Other           Med Note DINO, REGEENA   Sat Mar 14, 2024  7:56 PM)              Goals Addressed             This Visit's Progress    VBCI Transitions of Care (TOC) Care Plan       Problems:  Recent Hospitalization for treatment of CVA Knowledge Deficit Related to CVA  Goal:  Over the next 30 days, the patient will not experience hospital readmission  Interventions:  Transitions of Care: Doctor Visits  - discussed the importance of doctor visits Extensively reviewed medications with Spouse [name].  Advised of changes by PCP and Appointment for labs and follow up next week.  Discussed memory issues possibly being stroke related and how increase in Aricept  will possibly help  Stroke: Reviewed Importance of taking all  medications as prescribed Reviewed Importance of attending all scheduled provider appointments Advised to report any changes in symptoms or exercise tolerance Assessed for signs and symptoms of stroke Reviewed referrals to home health Assessed for management of bladder and/or bowel incontinence Assessed for cognitive impairment Assessed for fall status and safety in the home  Patient Self Care Activities:  Attend all scheduled provider appointments Call provider office for new concerns or questions  Notify RN Care Manager of TOC call rescheduling needs Participate in Transition of Care Program/Attend TOC scheduled calls Take medications as prescribed   Please monitor for symptoms: Sudden numbness or weakness in the face, arm, or leg, especially on one side of the body. Sudden confusion, trouble speaking, or difficulty understanding speech. Sudden trouble seeing in one or both eyes. Sudden trouble walking, dizziness, loss of balance, or lack of coordination. Sudden severe headache with no known cause. Call 9-1-1 right away if you or someone else has any of these symptoms.  Plan:  Telephone follow up appointment with care management team member scheduled for:  04/30/24 The patient has been provided with contact information for the care management team and has been advised to call with any health related questions or concerns.  Recommendation:   Continue Current Plan of Care  Follow Up Plan:   Telephone follow-up in 1 week  Gatha Mcnulty J. Dequon Schnebly RN, MSN [facility], [facility] RN Care Manager Direct Dial: [phone]  Fax: [phone] Website: [email or web address]

## 2024-04-23 NOTE — Patient Instructions (Signed)
 Visit Information  Thank you for taking time to visit with me today. Please don't hesitate to contact me if I can be of assistance to you before our next scheduled telephone appointment.  Our next appointment is by telephone on 04/30/24 at 100 pm  Following is a copy of your care plan:   Goals Addressed             This Visit's Progress    VBCI Transitions of Care (TOC) Care Plan       Problems:  Recent Hospitalization for treatment of CVA Knowledge Deficit Related to CVA  Goal:  Over the next 30 days, the patient will not experience hospital readmission  Interventions:  Transitions of Care: Doctor Visits  - discussed the importance of doctor visits Extensively reviewed medications with Spouse [name].  Advised of changes by PCP and Appointment for labs and follow up next week.  Discussed memory issues possibly being stroke related and how increase in Aricept  will possibly help  Stroke: Reviewed Importance of taking all medications as prescribed Reviewed Importance of attending all scheduled provider appointments Advised to report any changes in symptoms or exercise tolerance Assessed for signs and symptoms of stroke Reviewed referrals to home health Assessed for management of bladder and/or bowel incontinence Assessed for cognitive impairment Assessed for fall status and safety in the home  Patient Self Care Activities:  Attend all scheduled provider appointments Call provider office for new concerns or questions  Notify RN Care Manager of TOC call rescheduling needs Participate in Transition of Care Program/Attend TOC scheduled calls Take medications as prescribed   Please monitor for symptoms: Sudden numbness or weakness in the face, arm, or leg, especially on one side of the body. Sudden confusion, trouble speaking, or difficulty understanding speech. Sudden trouble seeing in one or both eyes. Sudden trouble walking, dizziness, loss of balance, or lack of  coordination. Sudden severe headache with no known cause. Call 9-1-1 right away if you or someone else has any of these symptoms.  Plan:  Telephone follow up appointment with care management team member scheduled for:  04/30/24 The patient has been provided with contact information for the care management team and has been advised to call with any health related questions or concerns.         Patient verbalizes understanding of instructions and care plan provided today and agrees to view in MyChart. Active MyChart status and patient understanding of how to access instructions and care plan via MyChart confirmed with patient.     The patient has been provided with contact information for the care management team and has been advised to call with any health related questions or concerns.   Please call the care guide team at [phone] if you need to cancel or reschedule your appointment.   Please call the Suicide and Crisis Lifeline: 988 if you are experiencing a Mental Health or Behavioral Health Crisis or need someone to talk to.  Libertie Hausler J. Storie Heffern RN, MSN [facility], [facility] RN Care Manager Direct Dial: [phone]  Fax: [phone] Website: [email or web address]

## 2024-04-24 ENCOUNTER — Telehealth: Payer: Self-pay

## 2024-04-24 DIAGNOSIS — I69398 Other sequelae of cerebral infarction: Secondary | ICD-10-CM | POA: Diagnosis not present

## 2024-04-24 DIAGNOSIS — I69354 Hemiplegia and hemiparesis following cerebral infarction affecting left non-dominant side: Secondary | ICD-10-CM | POA: Diagnosis not present

## 2024-04-24 NOTE — Patient Instructions (Signed)
 Visit Information  Thank you for taking time to visit with me today. Please don't hesitate to contact me if I can be of assistance to you before our next scheduled telephone appointment.  Our next appointment is by telephone on 04/30/24 at 100 pm  Following is a copy of your care plan:   Goals Addressed             This Visit's Progress    VBCI Transitions of Care (TOC) Care Plan       Problems:  Recent Hospitalization for treatment of CVA Knowledge Deficit Related to CVA  Goal:  Over the next 30 days, the patient will not experience hospital readmission  Interventions:  Transitions of Care: Doctor Visits  - discussed the importance of doctor visits RN CM again Extensively reviewed medications with Spouse [name].  Advised of changes by PCP and Appointment for labs and follow up next week.  Discussed memory issues possibly being stroke related and how increase in Aricept  will possibly help  Stroke: Reviewed Importance of taking all medications as prescribed Reviewed Importance of attending all scheduled provider appointments Advised to report any changes in symptoms or exercise tolerance Assessed for signs and symptoms of stroke Reviewed referrals to home health Assessed for management of bladder and/or bowel incontinence Assessed for cognitive impairment Assessed for fall status and safety in the home  Patient Self Care Activities:  Attend all scheduled provider appointments Call provider office for new concerns or questions  Notify RN Care Manager of TOC call rescheduling needs Participate in Transition of Care Program/Attend TOC scheduled calls Take medications as prescribed   Please monitor for symptoms: Sudden numbness or weakness in the face, arm, or leg, especially on one side of the body. Sudden confusion, trouble speaking, or difficulty understanding speech. Sudden trouble seeing in one or both eyes. Sudden trouble walking, dizziness, loss of balance, or lack of  coordination. Sudden severe headache with no known cause. Call 9-1-1 right away if you or someone else has any of these symptoms.  Plan:  Telephone follow up appointment with care management team member scheduled for:  04/30/24 The patient has been provided with contact information for the care management team and has been advised to call with any health related questions or concerns.         Patient verbalizes understanding of instructions and care plan provided today and agrees to view in MyChart. Active MyChart status and patient understanding of how to access instructions and care plan via MyChart confirmed with patient.     The patient has been provided with contact information for the care management team and has been advised to call with any health related questions or concerns.   Please call the care guide team at [phone] if you need to cancel or reschedule your appointment.   Please call the Suicide and Crisis Lifeline: 988 if you are experiencing a Mental Health or Behavioral Health Crisis or need someone to talk to.  Barnet Benavides J. Shirely Toren RN, MSN [facility], [facility] RN Care Manager Direct Dial: [phone]  Fax: [phone] Website: [email or web address]

## 2024-04-24 NOTE — Transitions of Care (Post Inpatient/ED Visit) (Signed)
 Transition of Care week 2 Follow up  Visit Note  04/24/2024  Name: Beth Macdonald MRN: [id number]          DOB: Jan 11, 1946  Situation: Patient enrolled in [facility] 30-day program. Visit completed with spouse Beth Macdonald by telephone. She needed reinforcement on medication changes.  Reviewed with her medications and doses.  She verbalized understanding and appreciative of information.  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  Medications Reviewed Today     Reviewed by Beth Garland, RN (Case Manager) on 04/24/24 at 1345  Med List Status: <None>   Medication Order Taking? Sig Documenting Provider Last Dose Status Informant  aspirin  EC 81 MG tablet [id number]  Take 1 tablet (81 mg total) by mouth daily. Swallow whole. Beth Guadalajara, MD  Active Spouse/Significant Other  atorvastatin  (LIPITOR) 20 MG tablet [id number]  Take 1 tablet (20 mg total) by mouth daily. Hold until you are taking Paxlovid  Amin, Sumayya, MD  Active Spouse/Significant Other   Cholecalciferol  (VITAMIN D3) 50 MCG (2000 UT) capsule [id number]  Take 2,000 Units by mouth daily. [provider]  Active Spouse/Significant Other  Clobetasol  Prop Emollient Base (CLOBETASOL  PROPIONATE E) 0.05 % emollient cream [id number]  Apply 1 Application topically 2 (two) times daily. Sheffield, Kelli R, PA-C  Active Spouse/Significant Other  clopidogrel  (PLAVIX ) 75 MG tablet [id number]  Take 1 tablet (75 mg total) by mouth daily. Beth Guadalajara, MD  Active Spouse/Significant Other  cycloSPORINE  (RESTASIS ) 0.05 % ophthalmic emulsion [id number]  Place 1 drop into both eyes 2 (two) times daily as needed. [provider]  Active Spouse/Significant Other  desonide  (DESOWEN ) 0.05 % cream [id number]  Apply topically 2 (two) times daily as needed (Rash). Sheffield, Kelli R, PA-C  Active Spouse/Significant Other  donepezil  (ARICEPT ) 5 MG tablet [id number]  Take 5 mg by mouth at bedtime.  Patient taking differently: Take 10 mg by mouth at bedtime.   [provider]  Active Spouse/Significant Other  lisinopril  (ZESTRIL ) 20 MG tablet [id number]  Take 1 tablet (20 mg total) by mouth daily.  Patient not taking: Reported on 04/23/2024   Beth Guadalajara, MD  Active   metFORMIN  (GLUCOPHAGE -XR) 500 MG 24 hr tablet [id number]  Take 1,000  mg by mouth 2 (two) times daily with a meal. [provider]  Active Spouse/Significant Other  Polyethyl Glycol-Propyl Glycol (SYSTANE FREE OP) [id number]  Apply 1 drop to eye daily as needed (dryness/irritation). [provider]  Active Spouse/Significant Other  REPATHA  SURECLICK 140 MG/ML SOAJ [id number]  Inject 140 mg into the skin every 14 (fourteen) days. Sunday [provider]  Active Spouse/Significant Other  Semaglutide ,0.25 or 0.5MG /DOS, 2 MG/3ML SOPN [id number]  Inject 0.5 mg into the skin once a week.  Patient taking differently: Inject 1 mg into the skin once a week.   [provider]  Active Spouse/Significant Other            Med Note Beth Macdonald   Sat Mar 14, 2024  7:56 PM)              Goals Addressed             This Visit's Progress    VBCI Transitions of Care (TOC) Care Plan       Problems:  Recent Hospitalization for treatment of CVA Knowledge Deficit Related to CVA  Goal:  Over the next 30 days, the patient will not experience hospital readmission  Interventions:  Transitions of Care: Doctor Visits  - discussed the importance of doctor visits RN CM again Extensively reviewed medications with Spouse Beth Macdonald.  Advised of changes by PCP and Appointment for labs and follow up next week.  Discussed memory issues possibly being stroke related and how increase in Aricept  will possibly help  Stroke: Reviewed Importance of taking all medications as prescribed Reviewed Importance of attending all scheduled provider appointments Advised to report any changes in symptoms or exercise tolerance Assessed for signs and symptoms of stroke Reviewed referrals to home health Assessed for management of bladder and/or bowel incontinence Assessed for cognitive impairment Assessed for fall status and safety in the home  Patient Self Care Activities:  Attend all scheduled provider appointments Call provider office for new concerns or questions  Notify RN Care Manager of TOC call rescheduling needs Participate in Transition of Care Program/Attend TOC scheduled calls Take medications as prescribed   Please monitor for symptoms: Sudden numbness or weakness in the face, arm, or leg, especially on one side of the body. Sudden confusion, trouble speaking, or difficulty understanding speech. Sudden trouble seeing in one or both eyes. Sudden trouble walking, dizziness, loss of balance, or lack of coordination. Sudden severe headache with no known cause. Call 9-1-1 right away if you or someone else has any of these symptoms.  Plan:  Telephone follow up appointment with care management team member  scheduled for:  04/30/24 The patient has been provided with contact information for the care management team and has been advised to call with any health related questions or concerns.         Recommendation:   Continue Current Plan of Care  Follow Up Plan:   Telephone follow-up in 1 week   Karen Huhta J. Avner Stroder RN, MSN [facility], [facility] RN Care Manager Direct Dial: [phone]  Fax: [phone] Website: [email or web address]

## 2024-04-27 DIAGNOSIS — R29898 Other symptoms and signs involving the musculoskeletal system: Secondary | ICD-10-CM | POA: Diagnosis not present

## 2024-04-27 DIAGNOSIS — I69398 Other sequelae of cerebral infarction: Secondary | ICD-10-CM | POA: Diagnosis not present

## 2024-04-27 DIAGNOSIS — I69354 Hemiplegia and hemiparesis following cerebral infarction affecting left non-dominant side: Secondary | ICD-10-CM | POA: Diagnosis not present

## 2024-04-27 DIAGNOSIS — I69254 Hemiplegia and hemiparesis following other nontraumatic intracranial hemorrhage affecting left non-dominant side: Secondary | ICD-10-CM | POA: Diagnosis not present

## 2024-04-27 DIAGNOSIS — I69391 Dysphagia following cerebral infarction: Secondary | ICD-10-CM | POA: Diagnosis not present

## 2024-04-28 DIAGNOSIS — I10 Essential (primary) hypertension: Secondary | ICD-10-CM | POA: Diagnosis not present

## 2024-04-29 DIAGNOSIS — R159 Full incontinence of feces: Secondary | ICD-10-CM | POA: Diagnosis not present

## 2024-04-29 DIAGNOSIS — N1832 Chronic kidney disease, stage 3b: Secondary | ICD-10-CM | POA: Diagnosis not present

## 2024-04-29 DIAGNOSIS — I69398 Other sequelae of cerebral infarction: Secondary | ICD-10-CM | POA: Diagnosis not present

## 2024-04-29 DIAGNOSIS — E1121 Type 2 diabetes mellitus with diabetic nephropathy: Secondary | ICD-10-CM | POA: Diagnosis not present

## 2024-04-29 DIAGNOSIS — R32 Unspecified urinary incontinence: Secondary | ICD-10-CM | POA: Diagnosis not present

## 2024-04-29 DIAGNOSIS — I69354 Hemiplegia and hemiparesis following cerebral infarction affecting left non-dominant side: Secondary | ICD-10-CM | POA: Diagnosis not present

## 2024-04-30 ENCOUNTER — Other Ambulatory Visit: Payer: MEDICARE

## 2024-04-30 DIAGNOSIS — I69398 Other sequelae of cerebral infarction: Secondary | ICD-10-CM | POA: Diagnosis not present

## 2024-04-30 DIAGNOSIS — I69354 Hemiplegia and hemiparesis following cerebral infarction affecting left non-dominant side: Secondary | ICD-10-CM | POA: Diagnosis not present

## 2024-04-30 NOTE — Transitions of Care (Post Inpatient/ED Visit) (Signed)
  Transition of Care week 3  Visit Note  04/30/2024  Name: Beth Macdonald MRN: [id number]          DOB: 01/09/1946  Situation: Patient enrolled in [facility] 30-day program. Visit completed with spouse [name] by telephone.  She states that she wants to talk but needs to run out.  Advised her to call CM back possibly tomorrow afternoon.  She verbalized understanding.    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

## 2024-05-01 DIAGNOSIS — I69398 Other sequelae of cerebral infarction: Secondary | ICD-10-CM | POA: Diagnosis not present

## 2024-05-01 DIAGNOSIS — I69354 Hemiplegia and hemiparesis following cerebral infarction affecting left non-dominant side: Secondary | ICD-10-CM | POA: Diagnosis not present

## 2024-05-05 ENCOUNTER — Encounter (HOSPITAL_COMMUNITY): Payer: Self-pay

## 2024-05-05 ENCOUNTER — Other Ambulatory Visit: Payer: Self-pay

## 2024-05-05 ENCOUNTER — Telehealth: Payer: Self-pay

## 2024-05-05 ENCOUNTER — Emergency Department (HOSPITAL_COMMUNITY)
Admission: EM | Admit: 2024-05-05 | Discharge: 2024-05-06 | Disposition: A | Discharge status: Home / Self Care | Payer: MEDICARE | Attending: Emergency Medicine | Admitting: Emergency Medicine

## 2024-05-05 DIAGNOSIS — E119 Type 2 diabetes mellitus without complications: Secondary | ICD-10-CM | POA: Insufficient documentation

## 2024-05-05 DIAGNOSIS — Z7984 Long term (current) use of oral hypoglycemic drugs: Secondary | ICD-10-CM | POA: Insufficient documentation

## 2024-05-05 DIAGNOSIS — Z7982 Long term (current) use of aspirin: Secondary | ICD-10-CM | POA: Insufficient documentation

## 2024-05-05 DIAGNOSIS — I69354 Hemiplegia and hemiparesis following cerebral infarction affecting left non-dominant side: Secondary | ICD-10-CM | POA: Diagnosis not present

## 2024-05-05 DIAGNOSIS — N3090 Cystitis, unspecified without hematuria: Secondary | ICD-10-CM | POA: Diagnosis not present

## 2024-05-05 DIAGNOSIS — D72829 Elevated white blood cell count, unspecified: Secondary | ICD-10-CM | POA: Insufficient documentation

## 2024-05-05 DIAGNOSIS — I69398 Other sequelae of cerebral infarction: Secondary | ICD-10-CM | POA: Diagnosis not present

## 2024-05-05 DIAGNOSIS — R5383 Other fatigue: Secondary | ICD-10-CM | POA: Diagnosis present

## 2024-05-05 DIAGNOSIS — R410 Disorientation, unspecified: Secondary | ICD-10-CM | POA: Diagnosis not present

## 2024-05-05 DIAGNOSIS — R531 Weakness: Secondary | ICD-10-CM | POA: Diagnosis not present

## 2024-05-05 LAB — URINALYSIS, ROUTINE W REFLEX MICROSCOPIC
Bilirubin Urine: NEGATIVE
Glucose, UA: NEGATIVE mg/dL
Ketones, ur: 5 mg/dL — AB
Nitrite: POSITIVE — AB
Protein, ur: >=300 mg/dL — AB
Specific Gravity, Urine: 1.013 (ref 1.005–1.030)
WBC, UA: >50 WBC/hpf (ref 0–5)
pH: 5 (ref 5.0–8.0)

## 2024-05-05 LAB — CBC WITH DIFFERENTIAL/PLATELET
Abs Immature Granulocytes: 0.07 K/uL (ref 0.00–0.07)
Basophils Absolute: 0.1 K/uL (ref 0.0–0.1)
Basophils Relative: 1 %
Eosinophils Absolute: 0.1 K/uL (ref 0.0–0.5)
Eosinophils Relative: 0 %
HCT: 42.4 % (ref 36.0–46.0)
Hemoglobin: 14.4 g/dL (ref 12.0–15.0)
Immature Granulocytes: 1 %
Lymphocytes Relative: 9 %
Lymphs Abs: 1.3 K/uL (ref 0.7–4.0)
MCH: 31.2 pg (ref 26.0–34.0)
MCHC: 34 g/dL (ref 30.0–36.0)
MCV: 91.8 fL (ref 80.0–100.0)
Monocytes Absolute: 1.2 K/uL — ABNORMAL HIGH (ref 0.1–1.0)
Monocytes Relative: 8 %
Neutro Abs: 11.4 K/uL — ABNORMAL HIGH (ref 1.7–7.7)
Neutrophils Relative %: 81 %
Platelets: 318 K/uL (ref 150–400)
RBC: 4.62 MIL/uL (ref 3.87–5.11)
RDW: 13.2 % (ref 11.5–15.5)
WBC: 14 K/uL — ABNORMAL HIGH (ref 4.0–10.5)
nRBC: 0 % (ref 0.0–0.2)

## 2024-05-05 LAB — BASIC METABOLIC PANEL WITH GFR
Anion gap: 17 — ABNORMAL HIGH (ref 5–15)
BUN: 16 mg/dL (ref 8–23)
CO2: 20 mmol/L — ABNORMAL LOW (ref 22–32)
Calcium: 9.7 mg/dL (ref 8.9–10.3)
Chloride: 102 mmol/L (ref 98–111)
Creatinine, Ser: 1.51 mg/dL — ABNORMAL HIGH (ref 0.44–1.00)
GFR, Estimated: 35 mL/min — ABNORMAL LOW (ref >60)
Glucose, Bld: 122 mg/dL — ABNORMAL HIGH (ref 70–99)
Potassium: 3.7 mmol/L (ref 3.5–5.1)
Sodium: 139 mmol/L (ref 135–145)

## 2024-05-05 MED ORDER — SODIUM CHLORIDE 0.9 % IV SOLN
1.0000 g | Freq: Once | INTRAVENOUS | Status: AC
  Administered 2024-05-05: 1 g via INTRAVENOUS
  Filled 2024-05-05: qty 10

## 2024-05-05 NOTE — ED Triage Notes (Signed)
 PT was brought in by [facility], for complaint of weakness and worried due to having a history of strokes. PT is confused at baseline and is at baseline per EMS/family. PT states she feels weak all over. PT was able to walk to EMS triage room. PT states she has a right sided weakness in facial muscles and smile since a kid. PT NIH per EMS was negative and was also negative upon arrival to ED.

## 2024-05-05 NOTE — Patient Instructions (Signed)
 Visit Information  Thank you for taking time to visit with me today. Please don't hesitate to contact me if I can be of assistance to you before our next scheduled telephone appointment.  Our next appointment is by telephone on 05/07/24 at 100 pm  Following is a copy of your care plan:   Goals Addressed             This Visit's Progress    VBCI Transitions of Care (TOC) Care Plan       Problems:  Recent Hospitalization for treatment of CVA Knowledge Deficit Related to CVA  Goal:  Over the next 30 days, the patient will not experience hospital readmission  Interventions:  Transitions of Care: Doctor Visits  - discussed the importance of doctor visits RN CM reviewed medications extensively only change from MD was deceasing metformin  to 2 tab once a day to possibly help with diarrhea.  Appointment for labs and follow up this week. Patient with frequent urination, UA ordered.  Advised on worsening UTI symptoms.   Reviewed Blood pressure readings.   Discussed patient appetite as patient really not eating.  Discussed use of supplement such as boost.   Stroke: Reviewed Importance of taking all medications as prescribed Reviewed Importance of attending all scheduled provider appointments Advised to report any changes in symptoms or exercise tolerance Assessed for signs and symptoms of stroke Reviewed referrals to home health Assessed for management of bladder and/or bowel incontinence Assessed for cognitive impairment Assessed for fall status and safety in the home  Patient Self Care Activities:  Attend all scheduled provider appointments Call provider office for new concerns or questions  Notify RN Care Manager of TOC call rescheduling needs Participate in Transition of Care Program/Attend TOC scheduled calls Take medications as prescribed   Please monitor for symptoms: Sudden numbness or weakness in the face, arm, or leg, especially on one side of the body. Sudden confusion,  trouble speaking, or difficulty understanding speech. Sudden trouble seeing in one or both eyes. Sudden trouble walking, dizziness, loss of balance, or lack of coordination. Sudden severe headache with no known cause. Call 9-1-1 right away if you or someone else has any of these symptoms.  Plan:  Telephone follow up appointment with care management team member scheduled for:  05/07/24 The patient has been provided with contact information for the care management team and has been advised to call with any health related questions or concerns.         Patient verbalizes understanding of instructions and care plan provided today and agrees to view in MyChart. Active MyChart status and patient understanding of how to access instructions and care plan via MyChart confirmed with patient.     The patient has been provided with contact information for the care management team and has been advised to call with any health related questions or concerns.   Please call the care guide team at [phone] if you need to cancel or reschedule your appointment.   Please call the Suicide and Crisis Lifeline: 988 if you are experiencing a Mental Health or Behavioral Health Crisis or need someone to talk to.  Aneya Daddona J. Vetra Shinall RN, MSN [facility], [facility] RN Care Manager Direct Dial: [phone]  Fax: [phone] Website: [email or web address]

## 2024-05-05 NOTE — Transitions of Care (Post Inpatient/ED Visit) (Signed)
 Transition of Care week 3  Visit Note  05/05/2024  Name: Beth Macdonald MRN: [id number]          DOB: 12/09/1945  Situation: Patient enrolled in [facility] 30-day program. Visit completed with spouse [name] by telephone.   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   Bridge of nose (in situ)   Skull fracture (HCC)    age 78   Squamous cell carcinoma of skin 03/22/2021   Right forehead (in situ)   TIA (transient ischemic attack) 2020   Varicose vein of leg     Assessment: Patient Reported Symptoms: Cognitive Cognitive Status: Alert and oriented to person, place, and time (Confusion at times)      Neurological Neurological Review of Symptoms: No symptoms reported    HEENT HEENT Symptoms Reported: No symptoms reported      Cardiovascular Cardiovascular Symptoms Reported: No symptoms reported Does patient have uncontrolled Hypertension?: Yes Is patient checking Blood Pressure at home?: Yes Patient's Recent BP reading at home: Spouse reports blood pressure has been good.  Last reading  128/82 Cardiovascular Management Strategies: Routine screening Cardiovascular Self-Management Outcome: 3 (uncertain)  Respiratory Respiratory Symptoms Reported: No symptoms reported    Endocrine Endocrine Symptoms Reported: No symptoms reported Is patient diabetic?: Yes Is patient checking blood sugars at home?: No (No recent readings.  Encouraged to on checking blood sugar.) Endocrine Self-Management Outcome: 4 (good)  Gastrointestinal Gastrointestinal Symptoms Reported:  Incontinence Gastrointestinal Management Strategies: Incontinence garment/pad    Genitourinary Genitourinary Symptoms Reported: Incontinence, Frequency Additional Genitourinary Details: UA ordered by PCP. Home Health to collect per spouse Genitourinary Management Strategies: Incontinence garment/pad Genitourinary Self-Management Outcome: 3 (uncertain)  Integumentary Integumentary Symptoms Reported: No symptoms reported    Musculoskeletal Musculoskelatal Symptoms Reviewed: Weakness Additional Musculoskeletal Details: [facility] Active Musculoskeletal Management Strategies: Exercise Musculoskeletal Self-Management Outcome: 3 (uncertain)      Psychosocial Psychosocial Symptoms Reported: Not assessed Additional Psychological Details: spouse completes the assessment         There were no vitals filed for this visit.  Medications Reviewed Today     Reviewed by Johncarlos Holtsclaw, RN (Case Manager) on 05/05/24 at 1506  Med List Status: <None>   Medication Order Taking? Sig Documenting Provider Last Dose Status Informant  aspirin  EC 81 MG tablet [id number] Yes Take 1 tablet (81 mg total) by mouth daily. Swallow whole. Christobal Guadalajara, MD  Active Spouse/Significant Other  atorvastatin  (LIPITOR) 20 MG tablet [id number] Yes Take 1 tablet (20 mg total) by mouth daily. Hold until you are taking Paxlovid  Amin, Sumayya, MD  Active Spouse/Significant Other  Cholecalciferol  (VITAMIN D3) 50 MCG (2000 UT) capsule [id number] Yes Take 2,000 Units by mouth daily. [provider]  Active Spouse/Significant Other  Clobetasol  Prop Emollient Base (CLOBETASOL  PROPIONATE E) 0.05 % emollient cream [id number] Yes Apply 1 Application topically 2 (two) times daily. Sheffield, Kelli R, PA-C  Active Spouse/Significant Other  clopidogrel  (PLAVIX ) 75 MG tablet [id number] Yes Take 1 tablet (75 mg total) by mouth daily. Christobal Guadalajara, MD  Active Spouse/Significant  Other  cycloSPORINE  (RESTASIS ) 0.05 % ophthalmic emulsion  [id number] Yes Place 1 drop into both eyes 2 (two) times daily as needed. [provider]  Active Spouse/Significant Other  desonide  (DESOWEN ) 0.05 % cream [id number] Yes Apply topically 2 (two) times daily as needed (Rash). Sheffield, Kelli R, PA-C  Active Spouse/Significant Other  donepezil  (ARICEPT ) 5 MG tablet [id number] Yes Take 5 mg by mouth at bedtime.  Patient taking differently: Take 10 mg by mouth at bedtime.   [provider]  Active Spouse/Significant Other  lisinopril  (ZESTRIL ) 20 MG tablet [id number]  Take 1 tablet (20 mg total) by mouth daily.  Patient not taking: Reported on 05/05/2024   Christobal Guadalajara, MD  Active   magnesium  oxide (MAG-OX) 400 (240 Mg) MG tablet [id number] Yes Take 400 mg by mouth daily. [provider]  Active   metFORMIN  (GLUCOPHAGE -XR) 500 MG 24 hr tablet [id number] Yes Take 1,000 mg by mouth 2 (two) times daily with a meal.  Patient taking differently: Take 1,000 mg by mouth daily.   [provider]  Active Spouse/Significant Other  Polyethyl Glycol-Propyl Glycol (SYSTANE FREE OP) [id number] Yes Apply 1 drop to eye daily as needed (dryness/irritation). [provider]  Active Spouse/Significant Other  REPATHA  SURECLICK 140 MG/ML SOAJ [id number] Yes Inject 140 mg into the skin every 14 (fourteen) days. Sunday [provider]  Active Spouse/Significant Other  Semaglutide ,0.25 or 0.5MG /DOS, 2 MG/3ML SOPN [id number] Yes Inject 0.5 mg into the skin once a week.  Patient taking differently: Inject 1 mg into the skin once a week.   [provider]  Active Spouse/Significant Other           Med Note DINO, REGEENA   Sat Mar 14, 2024  7:56 PM)              Goals Addressed             This Visit's Progress    VBCI Transitions of Care (TOC) Care Plan       Problems:  Recent Hospitalization for treatment of CVA Knowledge Deficit Related to CVA  Goal:  Over the next 30 days, the patient will not  experience hospital readmission  Interventions:  Transitions of Care: Doctor Visits  - discussed the importance of doctor visits RN CM reviewed medications extensively only change from MD was deceasing metformin  to 2 tab once a day to possibly help with diarrhea.  Appointment for labs and follow up this week. Patient with frequent urination, UA ordered.  Advised on worsening UTI symptoms.   Reviewed Blood pressure readings.   Discussed patient appetite as patient really not eating.  Discussed use of supplement such as boost.   Stroke: Reviewed Importance of taking all medications as prescribed Reviewed Importance of attending all scheduled provider appointments Advised to report any changes in symptoms or exercise tolerance Assessed for signs and symptoms of stroke Reviewed referrals to home health Assessed for management of bladder and/or bowel incontinence Assessed for cognitive impairment Assessed for fall status and safety in the home  Patient Self Care Activities:  Attend all scheduled provider appointments Call provider office for new concerns or questions  Notify RN Care Manager of TOC call rescheduling needs Participate in Transition of Care Program/Attend TOC scheduled calls Take medications as prescribed   Please monitor for symptoms: Sudden numbness or weakness in the face, arm, or leg, especially on one side of the body. Sudden confusion, trouble speaking, or difficulty understanding speech. Sudden trouble seeing in one or  both eyes. Sudden trouble walking, dizziness, loss of balance, or lack of coordination. Sudden severe headache with no known cause. Call 9-1-1 right away if you or someone else has any of these symptoms.  Plan:  Telephone follow up appointment with care management team member scheduled for:  05/07/24 The patient has been provided with contact information for the care management team and has been advised to call with any health related questions or  concerns.         Recommendation:   Continue Current Plan of Care  Follow Up Plan:   Telephone follow-up 05/07/24  Lavaun DOROTHA Seeds RN, MSN [place]  [facility], [facility] RN Care Manager Direct Dial: [phone]  Fax: [phone] Website: [email or web address]

## 2024-05-05 NOTE — ED Notes (Signed)
 Pt assisted to bathroom in wheelchair, Pt had one episode of fecal incontinence. Peri-care provided and new brief placed.

## 2024-05-05 NOTE — ED Provider Triage Note (Signed)
 Emergency Medicine Provider Triage Evaluation Note  Beth Macdonald , a 78 y.o. female  was evaluated in triage.  Pt complains of accident at home, having incontinence at home (bladder). With dysuria, no fevers. Reports normal PO intake.   Review of Systems  Positive:  Negative:   Physical Exam  BP (!) 168/76   Pulse 87   Temp 98.2 F (36.8 C)   Resp 18   Ht 5' 8 (1.727 m)   Wt 76 kg   SpO2 98%   BMI 25.48 kg/m  Gen:   Awake, no distress   Resp:  Normal effort  MSK:   Moves extremities without difficulty  Other:  Facial asymmetry from prior stroke   Medical Decision Making  Medically screening exam initiated at 10:31 PM.  Appropriate orders placed.  Beth Macdonald was informed that the remainder of the evaluation will be completed by another provider, this initial triage assessment does not replace that evaluation, and the importance of remaining in the ED until their evaluation is complete.     Beth Macdonald LABOR, PA-C 05/05/24 2233

## 2024-05-05 NOTE — ED Notes (Signed)
 Attempted to stick pt for labs, unsuccessful, phlebotomy contacted to collect.

## 2024-05-06 ENCOUNTER — Emergency Department (HOSPITAL_COMMUNITY): Payer: MEDICARE

## 2024-05-06 DIAGNOSIS — S199XXA Unspecified injury of neck, initial encounter: Secondary | ICD-10-CM | POA: Diagnosis not present

## 2024-05-06 DIAGNOSIS — N3090 Cystitis, unspecified without hematuria: Secondary | ICD-10-CM | POA: Diagnosis not present

## 2024-05-06 DIAGNOSIS — R29818 Other symptoms and signs involving the nervous system: Secondary | ICD-10-CM | POA: Diagnosis not present

## 2024-05-06 DIAGNOSIS — I69398 Other sequelae of cerebral infarction: Secondary | ICD-10-CM | POA: Diagnosis not present

## 2024-05-06 DIAGNOSIS — R9089 Other abnormal findings on diagnostic imaging of central nervous system: Secondary | ICD-10-CM | POA: Diagnosis not present

## 2024-05-06 DIAGNOSIS — I69354 Hemiplegia and hemiparesis following cerebral infarction affecting left non-dominant side: Secondary | ICD-10-CM | POA: Diagnosis not present

## 2024-05-06 DIAGNOSIS — G9389 Other specified disorders of brain: Secondary | ICD-10-CM | POA: Diagnosis not present

## 2024-05-06 DIAGNOSIS — R5383 Other fatigue: Secondary | ICD-10-CM | POA: Diagnosis not present

## 2024-05-06 DIAGNOSIS — S0990XA Unspecified injury of head, initial encounter: Secondary | ICD-10-CM | POA: Diagnosis not present

## 2024-05-06 MED ORDER — SODIUM CHLORIDE 0.9 % IV BOLUS
500.0000 mL | Freq: Once | INTRAVENOUS | Status: AC
  Administered 2024-05-06: 500 mL via INTRAVENOUS

## 2024-05-06 MED ORDER — CEPHALEXIN 500 MG PO CAPS: 500.0000 mg | ORAL_CAPSULE | Freq: Two times a day (BID) | ORAL | 0 refills | Status: DC

## 2024-05-06 NOTE — ED Notes (Signed)
 Pt ambulated to the bathroom.

## 2024-05-06 NOTE — ED Notes (Signed)
 Pt transported to MRI

## 2024-05-06 NOTE — ED Provider Notes (Incomplete)
 [place] EMERGENCY DEPARTMENT AT [facility] Provider Note   CSN: [id number] Arrival date & time: 05/05/24  2056     Patient presents with: Fatigue   Beth Macdonald is a 78 y.o. female.  {Add pertinent medical, surgical, social history, OB history to HPI:32947} HPI     Prior to Admission medications   Medication Sig Start Date End Date Taking? Authorizing Provider  aspirin  EC 81 MG tablet Take 1 tablet (81 mg total) by mouth daily. Swallow whole. 03/15/24 06/13/24  Christobal Guadalajara, MD  atorvastatin  (LIPITOR) 20 MG tablet Take 1 tablet (20 mg total) by mouth daily. Hold until you are taking Paxlovid  11/16/22   Caleen Qualia, MD  Cholecalciferol  (VITAMIN D3) 50 MCG (2000 UT) capsule Take 2,000 Units by mouth daily.    [provider]  Clobetasol  Prop Emollient Base (CLOBETASOL  PROPIONATE E) 0.05 % emollient cream Apply 1 Application topically 2 (two) times daily. 04/18/22   Sheffield, Kelli R, PA-C  clopidogrel  (PLAVIX ) 75 MG tablet Take 1 tablet (75 mg total) by mouth daily. 03/16/24 05/15/24  Christobal Guadalajara, MD  cycloSPORINE  (RESTASIS ) 0.05 % ophthalmic emulsion Place 1 drop into both eyes 2 (two) times daily as needed.    [provider]  desonide  (DESOWEN ) 0.05 % cream Apply topically 2 (two) times daily as needed (Rash). 03/22/21   Sheffield, Kelli R, PA-C  donepezil  (ARICEPT ) 5 MG tablet Take 5 mg by mouth at bedtime. Patient taking differently: Take 10 mg by mouth at bedtime. 03/13/24   [provider]  lisinopril  (ZESTRIL ) 20 MG tablet Take 1 tablet (20 mg total) by mouth daily. Patient not taking: Reported on 05/05/2024 04/13/24 04/13/25  Christobal Guadalajara, MD  magnesium  oxide (MAG-OX) 400 (240 Mg) MG tablet Take 400 mg by mouth daily.    [provider]  metFORMIN  (GLUCOPHAGE -XR) 500 MG 24 hr tablet Take 1,000 mg by mouth 2 (two) times daily with a meal. Patient taking differently: Take 1,000 mg by mouth daily.    [provider]  Polyethyl  Glycol-Propyl Glycol (SYSTANE FREE OP) Apply 1 drop to eye daily as needed (dryness/irritation).    [provider]  REPATHA  SURECLICK 140 MG/ML SOAJ Inject 140 mg into the skin every 14 (fourteen) days. Sunday 10/09/21   [provider]  Semaglutide ,0.25 or 0.5MG /DOS, 2 MG/3ML SOPN Inject 0.5 mg into the skin once a week. Patient taking differently: Inject 1 mg into the skin once a week.    [provider]    Allergies: Clindamycin/lincomycin, Erythromycin, Milk-related compounds, and Tetracyclines & related    Review of Systems  Updated Vital Signs BP (!) 172/72 (BP Location: Right Arm)   Pulse 74   Temp 97.6 F (36.4 C) (Oral)   Resp (!) 24   Ht 5' 8 (1.727 m)   Wt 76 kg   SpO2 98%   BMI 25.48 kg/m   Physical Exam  (all labs ordered are listed, but only abnormal results are displayed) Labs Reviewed  CBC WITH DIFFERENTIAL/PLATELET - Abnormal; Notable for the following components:      Result Value   WBC 14.0 (*)    Neutro Abs 11.4 (*)    Monocytes Absolute 1.2 (*)    All other components within normal limits  BASIC METABOLIC PANEL WITH GFR - Abnormal; Notable for the following components:   CO2 20 (*)    Glucose, Bld 122 (*)    Creatinine, Ser 1.51 (*)    GFR, Estimated 35 (*)  Anion gap 17 (*)    All other components within normal limits  URINALYSIS, ROUTINE W REFLEX MICROSCOPIC - Abnormal; Notable for the following components:   Color, Urine AMBER (*)    APPearance TURBID (*)    Hgb urine dipstick MODERATE (*)    Ketones, ur 5 (*)    Protein, ur >=300 (*)    Nitrite POSITIVE (*)    Leukocytes,Ua MODERATE (*)    Bacteria, UA MANY (*)    All other components within normal limits    EKG: None  Radiology: No results found.  {Document cardiac monitor, telemetry assessment procedure when appropriate:32947} Procedures   Medications Ordered in the ED  cefTRIAXone  (ROCEPHIN ) 1 g in sodium chloride  0.9 % 100 mL IVPB (1 g Intravenous  New Bag/Given 05/05/24 2247)      {Click here for ABCD2, HEART and other calculators REFRESH Note before signing:1}                              Medical Decision Making  ***  {Document critical care time when appropriate  Document review of labs and clinical decision tools ie CHADS2VASC2, etc  Document your independent review of radiology images and any outside records  Document your discussion with family members, caretakers and with consultants  Document social determinants of health affecting pt's care  Document your decision making why or why not admission, treatments were needed:32947:::1}   Final diagnoses:  None    ED Discharge Orders     None

## 2024-05-06 NOTE — Discharge Instructions (Signed)
 Take antibiotics as prescribed and complete the full course. Recheck with your primary care provider in 2 days. Return to the ER for any worsening or concerning symptoms.

## 2024-05-06 NOTE — ED Provider Notes (Signed)
 [place] EMERGENCY DEPARTMENT AT [facility] Provider Note   CSN: [id number] Arrival date & time: 05/05/24  2056     Patient presents with: Fatigue   Beth Macdonald is a 78 y.o. female.   78 year old female brought in by EMS from home where she lives with her wife. Pt complains of accident at home, having incontinence at home (bladder). With dysuria, no fevers. Reports normal PO intake.   Patient's wife, Beth Macdonald, arrives at bedside and states that patient has had 3 strokes in the past.  Patient was seen by home health nurse today who was concerned that patient may have a urinary tract infection as she is having urinary frequency and incontinence.  Patient had an episode of stool incontinence, wife took patient to the bathroom to give her a shower and patient had an episode where her legs became weak and she fell.  Did not hit head, no loss of conscious.  Fire department called to help get patient back up.  Patient's wife became more concerned that maybe she had had another stroke with his leg weakness episode as that was similar to her prior strokes.  No new deficits appreciated.  Patient is a poor historian, wife notes that patient is at mental status baseline.       Prior to Admission medications   Medication Sig Start Date End Date Taking? Authorizing Provider  cephALEXin  (KEFLEX ) 500 MG capsule Take 1 capsule (500 mg total) by mouth 2 (two) times daily for 5 days. 05/06/24 05/11/24 Yes Beverley Leita LABOR, PA-C  aspirin  EC 81 MG tablet Take 1 tablet (81 mg total) by mouth daily. Swallow whole. 03/15/24 06/13/24  Christobal Guadalajara, MD  atorvastatin  (LIPITOR) 20 MG tablet Take 1 tablet (20 mg total) by mouth daily. Hold until you are taking Paxlovid  11/16/22   Caleen Qualia, MD  Cholecalciferol  (VITAMIN D3) 50 MCG (2000 UT) capsule Take 2,000 Units by mouth daily.    [provider]  Clobetasol  Prop Emollient Base (CLOBETASOL  PROPIONATE E) 0.05 % emollient cream Apply 1 Application  topically 2 (two) times daily. 04/18/22   Sheffield, Kelli R, PA-C  clopidogrel  (PLAVIX ) 75 MG tablet Take 1 tablet (75 mg total) by mouth daily. 03/16/24 05/15/24  Christobal Guadalajara, MD  cycloSPORINE  (RESTASIS ) 0.05 % ophthalmic emulsion Place 1 drop into both eyes 2 (two) times daily as needed.    [provider]  desonide  (DESOWEN ) 0.05 % cream Apply topically 2 (two) times daily as needed (Rash). 03/22/21   Sheffield, Kelli R, PA-C  donepezil  (ARICEPT ) 5 MG tablet Take 5 mg by mouth at bedtime. Patient taking differently: Take 10 mg by mouth at bedtime. 03/13/24   [provider]  lisinopril  (ZESTRIL ) 20 MG tablet Take 1 tablet (20 mg total) by mouth daily. Patient not taking: Reported on 05/05/2024 04/13/24 04/13/25  Christobal Guadalajara, MD  magnesium  oxide (MAG-OX) 400 (240 Mg) MG tablet Take 400 mg by mouth daily.    [provider]  metFORMIN  (GLUCOPHAGE -XR) 500 MG 24 hr tablet Take 1,000 mg by mouth 2 (two) times daily with a meal. Patient taking differently: Take 1,000 mg by mouth daily.    [provider]  Polyethyl Glycol-Propyl Glycol (SYSTANE FREE OP) Apply 1 drop to eye daily as needed (dryness/irritation).    [provider]  REPATHA  SURECLICK 140 MG/ML SOAJ Inject 140 mg into the skin every 14 (fourteen) days. Sunday 10/09/21   [provider]  Semaglutide ,0.25 or 0.5MG /DOS, 2 MG/3ML SOPN  Inject 0.5 mg into the skin once a week. Patient taking differently: Inject 1 mg into the skin once a week.    [provider]    Allergies: Clindamycin/lincomycin, Erythromycin, Milk-related compounds, and Tetracyclines & related    Review of Systems Negative except as per HPI Updated Vital Signs BP (!) 135/41   Pulse 82   Temp (!) 97.5 F (36.4 C) (Oral)   Resp 20   Ht 5' 8 (1.727 m)   Wt 76 kg   SpO2 96%   BMI 25.48 kg/m   Physical Exam Vitals and nursing note reviewed.  Constitutional:      General: She is not in acute distress.     Appearance: She is well-developed. She is not diaphoretic.  HENT:     Head: Normocephalic and atraumatic.     Comments: Left-sided facial weakness, reported baseline    Mouth/Throat:     Mouth: Mucous membranes are moist.  Eyes:     Extraocular Movements: Extraocular movements intact.     Pupils: Pupils are equal, round, and reactive to light.  Cardiovascular:     Rate and Rhythm: Normal rate and regular rhythm.     Pulses: Normal pulses.     Heart sounds: Normal heart sounds.  Pulmonary:     Effort: Pulmonary effort is normal.     Breath sounds: Normal breath sounds.  Abdominal:     Palpations: Abdomen is soft.     Tenderness: There is no abdominal tenderness.  Musculoskeletal:     Cervical back: Neck supple.     Right lower leg: No edema.     Left lower leg: No edema.  Skin:    General: Skin is warm and dry.     Findings: No erythema or rash.  Neurological:     General: No focal deficit present.     Mental Status: She is alert. Mental status is at baseline.  Psychiatric:        Behavior: Behavior normal.     (all labs ordered are listed, but only abnormal results are displayed) Labs Reviewed  CBC WITH DIFFERENTIAL/PLATELET - Abnormal; Notable for the following components:      Result Value   WBC 14.0 (*)    Neutro Abs 11.4 (*)    Monocytes Absolute 1.2 (*)    All other components within normal limits  BASIC METABOLIC PANEL WITH GFR - Abnormal; Notable for the following components:   CO2 20 (*)    Glucose, Bld 122 (*)    Creatinine, Ser 1.51 (*)    GFR, Estimated 35 (*)    Anion gap 17 (*)    All other components within normal limits  URINALYSIS, ROUTINE W REFLEX MICROSCOPIC - Abnormal; Notable for the following components:   Color, Urine AMBER (*)    APPearance TURBID (*)    Hgb urine dipstick MODERATE (*)    Ketones, ur 5 (*)    Protein, ur >=300 (*)    Nitrite POSITIVE (*)    Leukocytes,Ua MODERATE (*)    Bacteria, UA MANY (*)    All other components  within normal limits    EKG: None  Radiology: MR BRAIN WO CONTRAST Result Date: 05/06/2024 EXAM: MRI BRAIN WITHOUT CONTRAST 05/06/2024 03:57:00 AM TECHNIQUE: Multiplanar multisequence MRI of the head/brain was performed without the administration of intravenous contrast. COMPARISON: 04/11/2024 CLINICAL HISTORY: Neuro deficit, acute, stroke suspected. FINDINGS: BRAIN AND VENTRICLES: Right greater than left occipital encephalomalacia. Old infarct of the left frontal operculum. Mild generalized  volume loss. Subcortical chronic infarcts within the medial right frontal and parietal lobes. Multifocal hyperintense T2-weighted signal within the cerebral white matter, most commonly due to chronic small vessel disease. No acute infarct. No intracranial hemorrhage. No mass. No midline shift. No hydrocephalus. The sella is unremarkable. Normal flow voids. ORBITS: Ocular lens replacements. No acute abnormality. SINUSES AND MASTOIDS: No acute abnormality. BONES AND SOFT TISSUES: Normal marrow signal. No acute soft tissue abnormality. IMPRESSION: 1. No acute intracranial abnormality. 2. Right greater than left occipital encephalomalacia, old infarct of the left frontal operculum, and subcortical chronic infarcts within the medial right frontal and parietal lobes. 3. Mild generalized volume loss. 4. Multifocal hyperintense T2-weighted signal within the cerebral white matter, most commonly due to chronic small vessel disease. Electronically signed by: Franky Stanford MD 05/06/2024 04:05 AM EDT RP Workstation: [id number]   CT Head Wo Contrast Result Date: 05/06/2024 EXAM: CT HEAD AND CERVICAL SPINE 05/06/2024 01:36:51 AM TECHNIQUE: CT of the head and cervical spine was performed without the administration of intravenous contrast. Multiplanar reformatted images are provided for review. Automated exposure control, iterative reconstruction, and/or weight based adjustment of the mA/kV was utilized to reduce the radiation dose to as  low as reasonably achievable. COMPARISON: 05/06/2024 CLINICAL HISTORY: Head trauma, minor (Age >= 65y). Head trauma, neck trauma/ fatigue FINDINGS: CT HEAD BRAIN AND VENTRICLES: No acute intracranial hemorrhage. No mass effect or midline shift. No abnormal extra-axial fluid collection. No evidence of acute infarct. No hydrocephalus. There are old infarcts of the right occipital lobe and left frontal white matter. Generalized volume loss. Chronic ischemic white matter changes. ORBITS: No acute abnormality. SINUSES AND MASTOIDS: No acute abnormality. SOFT TISSUES AND SKULL: No acute skull fracture. No acute soft tissue abnormality. CT CERVICAL SPINE BONES AND ALIGNMENT: No acute fracture or traumatic malalignment. DEGENERATIVE CHANGES: No significant degenerative changes. SOFT TISSUES: No prevertebral soft tissue swelling. IMPRESSION: 1. No acute intracranial abnormality. 2. Old infarcts of the right occipital lobe and left frontal white matter. 3. Generalized volume loss and chronic ischemic white matter changes. 4. No acute abnormality of the cervical spine. Electronically signed by: Franky Stanford MD 05/06/2024 02:07 AM EDT RP Workstation: [id number]   CT Cervical Spine Wo Contrast Result Date: 05/06/2024 EXAM: CT HEAD AND CERVICAL SPINE 05/06/2024 01:36:51 AM TECHNIQUE: CT of the head and cervical spine was performed without the administration of intravenous contrast. Multiplanar reformatted images are provided for review. Automated exposure control, iterative reconstruction, and/or weight based adjustment of the mA/kV was utilized to reduce the radiation dose to as low as reasonably achievable. COMPARISON: 05/06/2024 CLINICAL HISTORY: Head trauma, minor (Age >= 65y). Head trauma, neck trauma/ fatigue FINDINGS: CT HEAD BRAIN AND VENTRICLES: No acute intracranial hemorrhage. No mass effect or midline shift. No abnormal extra-axial fluid collection. No evidence of acute infarct. No hydrocephalus. There are old  infarcts of the right occipital lobe and left frontal white matter. Generalized volume loss. Chronic ischemic white matter changes. ORBITS: No acute abnormality. SINUSES AND MASTOIDS: No acute abnormality. SOFT TISSUES AND SKULL: No acute skull fracture. No acute soft tissue abnormality. CT CERVICAL SPINE BONES AND ALIGNMENT: No acute fracture or traumatic malalignment. DEGENERATIVE CHANGES: No significant degenerative changes. SOFT TISSUES: No prevertebral soft tissue swelling. IMPRESSION: 1. No acute intracranial abnormality. 2. Old infarcts of the right occipital lobe and left frontal white matter. 3. Generalized volume loss and chronic ischemic white matter changes. 4. No acute abnormality of the cervical spine. Electronically signed by: Franky Stanford MD 05/06/2024 02:07 AM  EDT RP Workstation: [id number]     Procedures   Medications Ordered in the ED  cefTRIAXone  (ROCEPHIN ) 1 g in sodium chloride  0.9 % 100 mL IVPB (0 g Intravenous Stopped 05/05/24 2317)  sodium chloride  0.9 % bolus 500 mL (0 mLs Intravenous Stopped 05/06/24 0319)                                    Medical Decision Making Amount and/or Complexity of Data Reviewed Radiology: ordered.  Risk Prescription drug management.   This patient presents to the ED for concern of urinary incontinence and dysuria, this involves an extensive number of treatment options, and is a complaint that carries with it a high risk of complications and morbidity.  The differential diagnosis includes but not limited to UTI, metabolic/electrolyte disturbance, CVA, intracerebral hemorrhage, c-spine injury   Co morbidities / Chronic conditions that complicate the patient evaluation  DM, HLD, CVA    Additional history obtained:  Additional history obtained from EMR External records from outside source obtained and reviewed including prior labs on file Prior echo 03/15/24 with EF 60-65%   Lab Tests:  I Ordered, and personally interpreted labs.   The pertinent results include:  CBC with mild leukocytosis at 14 with increase in neutrophils. BMP with gap 17, bicarb 20 with glucose of 122 and Cr 1.51. UA with ketones, protein, nitrites, leukocytes, RBC and WBC.    Imaging Studies ordered:  I ordered imaging studies including CT head, CT c-spine, MRI brain I independently visualized and interpreted imaging which showed no acute process I agree with the radiologist interpretation   Problem List / ED Course / Critical interventions / Medication management  78 year old female brought in by EMS from home as above.  She states that she has had urinary incontinence and frequency.  Additional history is provided by wife as above.  She is found have a urinary tract infection however wife is concerned that her prior 3 strokes have all presented with leg weakness.  She also reported fall in the shower due to the weakness.  For this reason, CT of the head and C-spine was obtained and is negative for acute traumatic findings.  MRI brain for further evaluation does not show acute stroke.  Patient is ambulatory at baseline, ambulates at home with a cane.  She is provided with Rocephin  for her urinary tract infection, small fluid bolus and discharged with prescription for antibiotics with recommendation to recheck with PCP in 2 days and return to ER for worsening or concerning symptoms. I ordered medication including rocephin    Reevaluation of the patient after these medicines showed that the patient improved, ambulatory with minimal assistance/at baseline I have reviewed the patients home medicines and have made adjustments as needed   Social Determinants of Health:  Lives at home with wife   Test / Admission - Considered:  Stable for dc      Final diagnoses:  Cystitis  Weakness    ED Discharge Orders          Ordered    cephALEXin  (KEFLEX ) 500 MG capsule  2 times daily        05/06/24 0426               Beverley Leita LABOR,  PA-C 05/06/24 0514    Haze Lonni PARAS, MD 05/06/24 [phone]

## 2024-05-07 ENCOUNTER — Other Ambulatory Visit: Payer: Self-pay

## 2024-05-07 DIAGNOSIS — L304 Erythema intertrigo: Secondary | ICD-10-CM | POA: Diagnosis not present

## 2024-05-07 DIAGNOSIS — N3000 Acute cystitis without hematuria: Secondary | ICD-10-CM | POA: Diagnosis not present

## 2024-05-07 DIAGNOSIS — Z01419 Encounter for gynecological examination (general) (routine) without abnormal findings: Secondary | ICD-10-CM | POA: Diagnosis not present

## 2024-05-07 NOTE — Patient Instructions (Signed)
 Visit Information  Thank you for taking time to visit with me today. Please don't hesitate to contact me if I can be of assistance to you before our next scheduled telephone appointment.  Our next appointment is by telephone on 05/12/24 at 100 pm  Following is a copy of your care plan:   Goals Addressed             This Visit's Progress    VBCI Transitions of Care (TOC) Care Plan       Problems:  Recent Hospitalization for treatment of CVA Knowledge Deficit Related to CVA  05/07/24 Spoke with patient and spouse.  She went to the ED due to fall in shower.  No injury reported.  However, patient treated for UTI. She is still having some incontinence.  Seen at PCP office today.  Patient reported to have yeast under her breast and under arms.  Diflucan ordered.    Goal:  Over the next 30 days, the patient will not experience hospital readmission  Interventions:  Transitions of Care: Doctor Visits  - discussed the importance of doctor visits Communication with PCP office re: adding aide order to home health orders. Message left.    Discussed with caregiver in home help such as an aide.  She has someone personal in mind. Discussed private versus agency.   Discussed patient appetite as patient really not eating.  Reviewed the use of supplement such as boost, ensure or premier protein.   Stroke: Reviewed Importance of taking all medications as prescribed Reviewed Importance of attending all scheduled provider appointments Advised to report any changes in symptoms or exercise tolerance Assessed for signs and symptoms of stroke Reviewed referrals to home health Assessed for management of bladder and/or bowel incontinence Assessed for cognitive impairment Assessed for fall status and safety in the home  Patient Self Care Activities:  Attend all scheduled provider appointments Call provider office for new concerns or questions  Notify RN Care Manager of TOC call rescheduling  needs Participate in Transition of Care Program/Attend TOC scheduled calls Take medications as prescribed   Please monitor for symptoms: Sudden numbness or weakness in the face, arm, or leg, especially on one side of the body. Sudden confusion, trouble speaking, or difficulty understanding speech. Sudden trouble seeing in one or both eyes. Sudden trouble walking, dizziness, loss of balance, or lack of coordination. Sudden severe headache with no known cause. Call 9-1-1 right away if you or someone else has any of these symptoms.  Plan:  The patient has been provided with contact information for the care management team and has been advised to call with any health related questions or concerns.         Patient verbalizes understanding of instructions and care plan provided today and agrees to view in MyChart. Active MyChart status and patient understanding of how to access instructions and care plan via MyChart confirmed with patient.     The patient has been provided with contact information for the care management team and has been advised to call with any health related questions or concerns.   Please call the care guide team at [phone] if you need to cancel or reschedule your appointment.   Please call the Suicide and Crisis Lifeline: 988 if you are experiencing a Mental Health or Behavioral Health Crisis or need someone to talk to.  Abdelrahman Nair J. Deaundra Dupriest RN, MSN [facility], [facility] RN Care Manager Direct Dial: [phone]  Fax: [phone] Website: [email or web address]

## 2024-05-07 NOTE — Transitions of Care (Post Inpatient/ED Visit) (Signed)
 Transition of Care Week 3 Follow up  Visit Note  05/07/2024  Name: Beth Macdonald MRN: [id number]          DOB: Oct 06, 1945  Situation: Patient enrolled in [facility] 30-day program. Visit completed with patient and spouse by telephone.   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 Sig Documenting Provider Last Dose Status Informant  aspirin  EC 81 MG tablet [id number]  Take 1 tablet (81 mg total) by mouth daily. Swallow whole. Christobal Guadalajara, MD  Active Spouse/Significant Other  atorvastatin  (LIPITOR) 20 MG tablet [id number]  Take 1 tablet (20 mg total) by mouth daily. Hold until you are taking Paxlovid  Amin, Sumayya, MD  Active Spouse/Significant Other  cephALEXin  (KEFLEX ) 500 MG capsule [id number]  Take 1 capsule (500 mg total) by mouth 2 (two) times daily for 5 days. Beverley Leita LABOR, PA-C  Active   Cholecalciferol  (VITAMIN D3) 50 MCG (2000 UT) capsule [id number]  Take 2,000 Units by mouth daily. [provider]  Active Spouse/Significant Other  Clobetasol  Prop Emollient Base (CLOBETASOL  PROPIONATE E) 0.05 % emollient cream [id number]  Apply 1 Application topically 2 (two) times daily. Sheffield, Kelli R, PA-C  Active  Spouse/Significant Other  clopidogrel  (PLAVIX ) 75 MG tablet [id number]  Take 1 tablet (75 mg total) by mouth daily. Christobal Guadalajara, MD  Active Spouse/Significant Other  cycloSPORINE  (RESTASIS ) 0.05 % ophthalmic emulsion [id number]  Place 1 drop into both eyes 2 (two) times daily as needed. [provider]  Active Spouse/Significant Other  desonide  (DESOWEN ) 0.05 % cream [id number]  Apply topically 2 (two) times daily as needed (Rash). Sheffield, Kelli R, PA-C  Active Spouse/Significant Other  donepezil  (ARICEPT ) 5 MG tablet [id number]  Take 5 mg by mouth at bedtime.  Patient taking differently: Take 10 mg by mouth at bedtime.   [provider]  Active Spouse/Significant Other  lisinopril  (ZESTRIL ) 20 MG tablet [id number]  Take 1 tablet (20 mg total) by mouth daily.  Patient not taking: Reported on 05/05/2024   Christobal Guadalajara, MD  Active   magnesium  oxide (MAG-OX) 400 (240 Mg) MG tablet [id number]  Take 400 mg by mouth daily. [provider]  Active   metFORMIN  (GLUCOPHAGE -XR) 500 MG 24 hr tablet [id number]  Take 1,000 mg by mouth 2 (two) times daily with a meal.  Patient taking differently: Take 1,000 mg by mouth daily.   [provider]  Active Spouse/Significant Other  Polyethyl Glycol-Propyl Glycol (SYSTANE FREE OP) [id number]  Apply 1 drop to eye daily as needed (dryness/irritation). [provider]  Active Spouse/Significant Other  REPATHA  SURECLICK 140 MG/ML  SOAJ [id number]  Inject 140 mg into the skin every 14 (fourteen) days. Sunday [provider]  Active Spouse/Significant Other  Semaglutide ,0.25 or 0.5MG /DOS, 2 MG/3ML SOPN [id number]  Inject 0.5 mg into the skin once a week.  Patient taking differently: Inject 1 mg into the skin once a week.   [provider]  Active Spouse/Significant Other           Med Note DINO, NEVADA   Dju Mar 14, 2024  7:56 PM)              Goals Addressed             This Visit's Progress    VBCI  Transitions of Care (TOC) Care Plan       Problems:  Recent Hospitalization for treatment of CVA Knowledge Deficit Related to CVA  05/07/24 Spoke with patient and spouse.  She went to the ED due to fall in shower.  No injury reported.  However, patient treated for UTI. She is still having some incontinence.  Seen at PCP office today.  Patient reported to have yeast under her breast and under arms.  Diflucan ordered.    Goal:  Over the next 30 days, the patient will not experience hospital readmission  Interventions:  Transitions of Care: Doctor Visits  - discussed the importance of doctor visits Communication with PCP office re: adding aide order to home health orders. Message left.    Discussed with caregiver in home help such as an aide.  She has someone personal in mind. Discussed private versus agency.   Discussed patient appetite as patient really not eating.  Reviewed the use of supplement such as boost, ensure or premier protein.   Stroke: Reviewed Importance of taking all medications as prescribed Reviewed Importance of attending all scheduled provider appointments Advised to report any changes in symptoms or exercise tolerance Assessed for signs and symptoms of stroke Reviewed referrals to home health Assessed for management of bladder and/or bowel incontinence Assessed for cognitive impairment Assessed for fall status and safety in the home  Patient Self Care Activities:  Attend all scheduled provider appointments Call provider office for new concerns or questions  Notify RN Care Manager of TOC call rescheduling needs Participate in Transition of Care Program/Attend TOC scheduled calls Take medications as prescribed   Please monitor for symptoms: Sudden numbness or weakness in the face, arm, or leg, especially on one side of the body. Sudden confusion, trouble speaking, or difficulty understanding speech. Sudden trouble seeing in one or both eyes. Sudden trouble walking,  dizziness, loss of balance, or lack of coordination. Sudden severe headache with no known cause. Call 9-1-1 right away if you or someone else has any of these symptoms.  Plan:  The patient has been provided with contact information for the care management team and has been advised to call with any health related questions or concerns.         Recommendation:   Home Health requests: Aide Continue Current Plan of Care  Follow Up Plan:   Telephone follow-up in 1 week  Keoshia Steinmetz J. Aitanna Haubner RN, MSN [facility], [facility] RN Care Manager Direct Dial: [phone]  Fax: [phone] Website: [email or web address]

## 2024-05-08 ENCOUNTER — Telehealth: Payer: Self-pay

## 2024-05-08 DIAGNOSIS — I69354 Hemiplegia and hemiparesis following cerebral infarction affecting left non-dominant side: Secondary | ICD-10-CM | POA: Diagnosis not present

## 2024-05-08 DIAGNOSIS — I69398 Other sequelae of cerebral infarction: Secondary | ICD-10-CM | POA: Diagnosis not present

## 2024-05-08 NOTE — Transitions of Care (Post Inpatient/ED Visit) (Signed)
 05/08/2024  Patient ID: Candis MATSU Lantz, female   DOB: 1946/09/01, 78 y.o.   MRN: [id number]  Incoming call from [place] at [facility] about adding aide order to home health.  Advised that ordered needed to be called to [facility] who is servicing patient currently.  [facility] contact information given to contact.  She states she will call to give further orders.    Zamyiah Tino J. Malayzia Laforte RN, MSN [facility], [facility] RN Care Manager Direct Dial: [phone]  Fax: [phone] Website: [email or web address]

## 2024-05-11 ENCOUNTER — Encounter (HOSPITAL_COMMUNITY): Payer: MEDICARE

## 2024-05-11 ENCOUNTER — Emergency Department (HOSPITAL_COMMUNITY): Payer: MEDICARE

## 2024-05-11 ENCOUNTER — Other Ambulatory Visit: Payer: Self-pay

## 2024-05-11 ENCOUNTER — Ambulatory Visit: Payer: MEDICARE

## 2024-05-11 ENCOUNTER — Inpatient Hospital Stay (HOSPITAL_COMMUNITY): Payer: MEDICARE

## 2024-05-11 ENCOUNTER — Inpatient Hospital Stay (HOSPITAL_COMMUNITY)
Admission: EM | Admit: 2024-05-11 | Discharge: 2024-05-20 | DRG: 100 | Disposition: A | Discharge status: Rehabilitation Facility | Payer: MEDICARE | Attending: Internal Medicine | Admitting: Internal Medicine

## 2024-05-11 ENCOUNTER — Encounter (HOSPITAL_COMMUNITY): Payer: Self-pay | Admitting: Radiology

## 2024-05-11 ENCOUNTER — Emergency Department (HOSPITAL_COMMUNITY)
Admission: EM | Admit: 2024-05-11 | Discharge: 2024-05-11 | Disposition: A | Discharge status: Home / Self Care | Payer: MEDICARE | Attending: Emergency Medicine | Admitting: Emergency Medicine

## 2024-05-11 DIAGNOSIS — N1832 Chronic kidney disease, stage 3b: Secondary | ICD-10-CM | POA: Diagnosis not present

## 2024-05-11 DIAGNOSIS — R7989 Other specified abnormal findings of blood chemistry: Secondary | ICD-10-CM

## 2024-05-11 DIAGNOSIS — Z781 Physical restraint status: Secondary | ICD-10-CM

## 2024-05-11 DIAGNOSIS — E1142 Type 2 diabetes mellitus with diabetic polyneuropathy: Secondary | ICD-10-CM | POA: Diagnosis not present

## 2024-05-11 DIAGNOSIS — J9600 Acute respiratory failure, unspecified whether with hypoxia or hypercapnia: Secondary | ICD-10-CM | POA: Diagnosis not present

## 2024-05-11 DIAGNOSIS — I213 ST elevation (STEMI) myocardial infarction of unspecified site: Secondary | ICD-10-CM | POA: Diagnosis not present

## 2024-05-11 DIAGNOSIS — I5181 Takotsubo syndrome: Secondary | ICD-10-CM | POA: Diagnosis not present

## 2024-05-11 DIAGNOSIS — R29818 Other symptoms and signs involving the nervous system: Secondary | ICD-10-CM | POA: Diagnosis not present

## 2024-05-11 DIAGNOSIS — I959 Hypotension, unspecified: Secondary | ICD-10-CM | POA: Diagnosis not present

## 2024-05-11 DIAGNOSIS — I6782 Cerebral ischemia: Secondary | ICD-10-CM | POA: Diagnosis not present

## 2024-05-11 DIAGNOSIS — N189 Chronic kidney disease, unspecified: Secondary | ICD-10-CM | POA: Diagnosis not present

## 2024-05-11 DIAGNOSIS — Z85828 Personal history of other malignant neoplasm of skin: Secondary | ICD-10-CM

## 2024-05-11 DIAGNOSIS — Z7401 Bed confinement status: Secondary | ICD-10-CM | POA: Diagnosis not present

## 2024-05-11 DIAGNOSIS — I130 Hypertensive heart and chronic kidney disease with heart failure and stage 1 through stage 4 chronic kidney disease, or unspecified chronic kidney disease: Secondary | ICD-10-CM | POA: Diagnosis not present

## 2024-05-11 DIAGNOSIS — E119 Type 2 diabetes mellitus without complications: Secondary | ICD-10-CM | POA: Diagnosis not present

## 2024-05-11 DIAGNOSIS — R918 Other nonspecific abnormal finding of lung field: Secondary | ICD-10-CM | POA: Diagnosis not present

## 2024-05-11 DIAGNOSIS — Z7901 Long term (current) use of anticoagulants: Secondary | ICD-10-CM | POA: Insufficient documentation

## 2024-05-11 DIAGNOSIS — K5730 Diverticulosis of large intestine without perforation or abscess without bleeding: Secondary | ICD-10-CM | POA: Diagnosis not present

## 2024-05-11 DIAGNOSIS — G459 Transient cerebral ischemic attack, unspecified: Secondary | ICD-10-CM

## 2024-05-11 DIAGNOSIS — Z8673 Personal history of transient ischemic attack (TIA), and cerebral infarction without residual deficits: Secondary | ICD-10-CM | POA: Diagnosis not present

## 2024-05-11 DIAGNOSIS — I69398 Other sequelae of cerebral infarction: Secondary | ICD-10-CM | POA: Diagnosis not present

## 2024-05-11 DIAGNOSIS — R531 Weakness: Secondary | ICD-10-CM | POA: Diagnosis not present

## 2024-05-11 DIAGNOSIS — R0902 Hypoxemia: Secondary | ICD-10-CM | POA: Diagnosis not present

## 2024-05-11 DIAGNOSIS — E1140 Type 2 diabetes mellitus with diabetic neuropathy, unspecified: Secondary | ICD-10-CM | POA: Diagnosis not present

## 2024-05-11 DIAGNOSIS — N390 Urinary tract infection, site not specified: Secondary | ICD-10-CM | POA: Diagnosis present

## 2024-05-11 DIAGNOSIS — M25559 Pain in unspecified hip: Secondary | ICD-10-CM | POA: Diagnosis not present

## 2024-05-11 DIAGNOSIS — R4701 Aphasia: Secondary | ICD-10-CM | POA: Diagnosis present

## 2024-05-11 DIAGNOSIS — I5022 Chronic systolic (congestive) heart failure: Secondary | ICD-10-CM | POA: Diagnosis not present

## 2024-05-11 DIAGNOSIS — J9601 Acute respiratory failure with hypoxia: Secondary | ICD-10-CM | POA: Diagnosis not present

## 2024-05-11 DIAGNOSIS — R579 Shock, unspecified: Secondary | ICD-10-CM | POA: Diagnosis not present

## 2024-05-11 DIAGNOSIS — R569 Unspecified convulsions: Secondary | ICD-10-CM | POA: Diagnosis not present

## 2024-05-11 DIAGNOSIS — I69354 Hemiplegia and hemiparesis following cerebral infarction affecting left non-dominant side: Secondary | ICD-10-CM

## 2024-05-11 DIAGNOSIS — I5021 Acute systolic (congestive) heart failure: Secondary | ICD-10-CM

## 2024-05-11 DIAGNOSIS — R1310 Dysphagia, unspecified: Secondary | ICD-10-CM | POA: Diagnosis not present

## 2024-05-11 DIAGNOSIS — Z794 Long term (current) use of insulin: Secondary | ICD-10-CM | POA: Diagnosis not present

## 2024-05-11 DIAGNOSIS — I672 Cerebral atherosclerosis: Secondary | ICD-10-CM | POA: Diagnosis present

## 2024-05-11 DIAGNOSIS — Z7985 Long-term (current) use of injectable non-insulin antidiabetic drugs: Secondary | ICD-10-CM | POA: Diagnosis not present

## 2024-05-11 DIAGNOSIS — Z806 Family history of leukemia: Secondary | ICD-10-CM

## 2024-05-11 DIAGNOSIS — I340 Nonrheumatic mitral (valve) insufficiency: Secondary | ICD-10-CM | POA: Diagnosis present

## 2024-05-11 DIAGNOSIS — Z7982 Long term (current) use of aspirin: Secondary | ICD-10-CM | POA: Insufficient documentation

## 2024-05-11 DIAGNOSIS — Z8249 Family history of ischemic heart disease and other diseases of the circulatory system: Secondary | ICD-10-CM

## 2024-05-11 DIAGNOSIS — Z8349 Family history of other endocrine, nutritional and metabolic diseases: Secondary | ICD-10-CM

## 2024-05-11 DIAGNOSIS — Z7902 Long term (current) use of antithrombotics/antiplatelets: Secondary | ICD-10-CM

## 2024-05-11 DIAGNOSIS — E440 Moderate protein-calorie malnutrition: Secondary | ICD-10-CM | POA: Diagnosis present

## 2024-05-11 DIAGNOSIS — R410 Disorientation, unspecified: Secondary | ICD-10-CM | POA: Diagnosis not present

## 2024-05-11 DIAGNOSIS — I6601 Occlusion and stenosis of right middle cerebral artery: Secondary | ICD-10-CM | POA: Diagnosis not present

## 2024-05-11 DIAGNOSIS — F0390 Unspecified dementia without behavioral disturbance: Secondary | ICD-10-CM | POA: Diagnosis present

## 2024-05-11 DIAGNOSIS — Z96651 Presence of right artificial knee joint: Secondary | ICD-10-CM | POA: Diagnosis present

## 2024-05-11 DIAGNOSIS — R9431 Abnormal electrocardiogram [ECG] [EKG]: Secondary | ICD-10-CM | POA: Diagnosis not present

## 2024-05-11 DIAGNOSIS — E876 Hypokalemia: Secondary | ICD-10-CM | POA: Diagnosis not present

## 2024-05-11 DIAGNOSIS — E1122 Type 2 diabetes mellitus with diabetic chronic kidney disease: Secondary | ICD-10-CM | POA: Diagnosis not present

## 2024-05-11 DIAGNOSIS — E785 Hyperlipidemia, unspecified: Secondary | ICD-10-CM | POA: Diagnosis present

## 2024-05-11 DIAGNOSIS — R29810 Facial weakness: Secondary | ICD-10-CM | POA: Diagnosis present

## 2024-05-11 DIAGNOSIS — E870 Hyperosmolality and hypernatremia: Secondary | ICD-10-CM | POA: Diagnosis not present

## 2024-05-11 DIAGNOSIS — G40909 Epilepsy, unspecified, not intractable, without status epilepticus: Secondary | ICD-10-CM | POA: Diagnosis not present

## 2024-05-11 DIAGNOSIS — R000 Tachycardia, unspecified: Secondary | ICD-10-CM | POA: Diagnosis not present

## 2024-05-11 DIAGNOSIS — I82612 Acute embolism and thrombosis of superficial veins of left upper extremity: Secondary | ICD-10-CM | POA: Diagnosis not present

## 2024-05-11 DIAGNOSIS — I63522 Cerebral infarction due to unspecified occlusion or stenosis of left anterior cerebral artery: Secondary | ICD-10-CM | POA: Diagnosis not present

## 2024-05-11 DIAGNOSIS — Z7984 Long term (current) use of oral hypoglycemic drugs: Secondary | ICD-10-CM | POA: Diagnosis not present

## 2024-05-11 DIAGNOSIS — I639 Cerebral infarction, unspecified: Secondary | ICD-10-CM

## 2024-05-11 DIAGNOSIS — Z87891 Personal history of nicotine dependence: Secondary | ICD-10-CM | POA: Diagnosis not present

## 2024-05-11 DIAGNOSIS — D72829 Elevated white blood cell count, unspecified: Secondary | ICD-10-CM | POA: Insufficient documentation

## 2024-05-11 DIAGNOSIS — Z4682 Encounter for fitting and adjustment of non-vascular catheter: Secondary | ICD-10-CM | POA: Diagnosis not present

## 2024-05-11 DIAGNOSIS — E8889 Other specified metabolic disorders: Secondary | ICD-10-CM | POA: Diagnosis not present

## 2024-05-11 DIAGNOSIS — G9340 Encephalopathy, unspecified: Secondary | ICD-10-CM | POA: Diagnosis not present

## 2024-05-11 DIAGNOSIS — G40101 Localization-related (focal) (partial) symptomatic epilepsy and epileptic syndromes with simple partial seizures, not intractable, with status epilepticus: Secondary | ICD-10-CM | POA: Diagnosis not present

## 2024-05-11 DIAGNOSIS — R0989 Other specified symptoms and signs involving the circulatory and respiratory systems: Secondary | ICD-10-CM | POA: Diagnosis not present

## 2024-05-11 DIAGNOSIS — G319 Degenerative disease of nervous system, unspecified: Secondary | ICD-10-CM | POA: Diagnosis not present

## 2024-05-11 DIAGNOSIS — J90 Pleural effusion, not elsewhere classified: Secondary | ICD-10-CM | POA: Diagnosis not present

## 2024-05-11 DIAGNOSIS — I10 Essential (primary) hypertension: Secondary | ICD-10-CM | POA: Diagnosis not present

## 2024-05-11 DIAGNOSIS — Z6823 Body mass index (BMI) 23.0-23.9, adult: Secondary | ICD-10-CM

## 2024-05-11 DIAGNOSIS — R300 Dysuria: Secondary | ICD-10-CM | POA: Diagnosis not present

## 2024-05-11 DIAGNOSIS — K5900 Constipation, unspecified: Secondary | ICD-10-CM | POA: Diagnosis not present

## 2024-05-11 DIAGNOSIS — I700 Atherosclerosis of aorta: Secondary | ICD-10-CM | POA: Diagnosis present

## 2024-05-11 DIAGNOSIS — J690 Pneumonitis due to inhalation of food and vomit: Secondary | ICD-10-CM | POA: Diagnosis not present

## 2024-05-11 DIAGNOSIS — Z91011 Allergy to milk products: Secondary | ICD-10-CM

## 2024-05-11 DIAGNOSIS — R471 Dysarthria and anarthria: Secondary | ICD-10-CM | POA: Diagnosis present

## 2024-05-11 DIAGNOSIS — E1169 Type 2 diabetes mellitus with other specified complication: Secondary | ICD-10-CM | POA: Diagnosis not present

## 2024-05-11 DIAGNOSIS — G9389 Other specified disorders of brain: Secondary | ICD-10-CM | POA: Diagnosis not present

## 2024-05-11 DIAGNOSIS — N179 Acute kidney failure, unspecified: Secondary | ICD-10-CM | POA: Diagnosis not present

## 2024-05-11 DIAGNOSIS — Z452 Encounter for adjustment and management of vascular access device: Secondary | ICD-10-CM | POA: Diagnosis not present

## 2024-05-11 DIAGNOSIS — Z79899 Other long term (current) drug therapy: Secondary | ICD-10-CM

## 2024-05-11 DIAGNOSIS — R609 Edema, unspecified: Secondary | ICD-10-CM | POA: Diagnosis not present

## 2024-05-11 DIAGNOSIS — N3289 Other specified disorders of bladder: Secondary | ICD-10-CM | POA: Diagnosis not present

## 2024-05-11 DIAGNOSIS — G40109 Localization-related (focal) (partial) symptomatic epilepsy and epileptic syndromes with simple partial seizures, not intractable, without status epilepticus: Secondary | ICD-10-CM | POA: Diagnosis not present

## 2024-05-11 DIAGNOSIS — J189 Pneumonia, unspecified organism: Secondary | ICD-10-CM | POA: Diagnosis not present

## 2024-05-11 DIAGNOSIS — Z823 Family history of stroke: Secondary | ICD-10-CM

## 2024-05-11 DIAGNOSIS — Z881 Allergy status to other antibiotic agents status: Secondary | ICD-10-CM

## 2024-05-11 DIAGNOSIS — M1990 Unspecified osteoarthritis, unspecified site: Secondary | ICD-10-CM | POA: Diagnosis present

## 2024-05-11 LAB — I-STAT CHEM 8, ED
BUN: 16 mg/dL (ref 8–23)
Calcium, Ion: 1.08 mmol/L — ABNORMAL LOW (ref 1.15–1.40)
Chloride: 102 mmol/L (ref 98–111)
Creatinine, Ser: 1.1 mg/dL — ABNORMAL HIGH (ref 0.44–1.00)
Glucose, Bld: 233 mg/dL — ABNORMAL HIGH (ref 70–99)
HCT: 47 % — ABNORMAL HIGH (ref 36.0–46.0)
Hemoglobin: 16 g/dL — ABNORMAL HIGH (ref 12.0–15.0)
Potassium: 3.4 mmol/L — ABNORMAL LOW (ref 3.5–5.1)
Sodium: 138 mmol/L (ref 135–145)
TCO2: 14 mmol/L — ABNORMAL LOW (ref 22–32)

## 2024-05-11 LAB — CBC
HCT: 43.9 % (ref 36.0–46.0)
HCT: 45.7 % (ref 36.0–46.0)
Hemoglobin: 14.8 g/dL (ref 12.0–15.0)
Hemoglobin: 14.2 g/dL (ref 12.0–15.0)
MCH: 30.5 pg (ref 26.0–34.0)
MCH: 30.7 pg (ref 26.0–34.0)
MCHC: 33.7 g/dL (ref 30.0–36.0)
MCHC: 31.1 g/dL (ref 30.0–36.0)
MCV: 90.5 fL (ref 80.0–100.0)
MCV: 98.7 fL (ref 80.0–100.0)
Platelets: 345 K/uL (ref 150–400)
Platelets: 389 K/uL (ref 150–400)
RBC: 4.85 MIL/uL (ref 3.87–5.11)
RBC: 4.63 MIL/uL (ref 3.87–5.11)
RDW: 13 % (ref 11.5–15.5)
RDW: 13 % (ref 11.5–15.5)
WBC: 13.5 K/uL — ABNORMAL HIGH (ref 4.0–10.5)
WBC: 23.5 K/uL — ABNORMAL HIGH (ref 4.0–10.5)
nRBC: 0 % (ref 0.0–0.2)
nRBC: 0 % (ref 0.0–0.2)

## 2024-05-11 LAB — COMPREHENSIVE METABOLIC PANEL WITH GFR
ALT: 17 U/L (ref 0–44)
AST: 37 U/L (ref 15–41)
Albumin: 3.3 g/dL — ABNORMAL LOW (ref 3.5–5.0)
Alkaline Phosphatase: 52 U/L (ref 38–126)
Anion gap: 33 — ABNORMAL HIGH (ref 5–15)
BUN: 13 mg/dL (ref 8–23)
CO2: 10 mmol/L — ABNORMAL LOW (ref 22–32)
Calcium: 9.5 mg/dL (ref 8.9–10.3)
Chloride: 97 mmol/L — ABNORMAL LOW (ref 98–111)
Creatinine, Ser: 1.5 mg/dL — ABNORMAL HIGH (ref 0.44–1.00)
GFR, Estimated: 35 mL/min — ABNORMAL LOW (ref >60)
Glucose, Bld: 248 mg/dL — ABNORMAL HIGH (ref 70–99)
Potassium: 3.5 mmol/L (ref 3.5–5.1)
Sodium: 140 mmol/L (ref 135–145)
Total Bilirubin: 0.8 mg/dL (ref 0.0–1.2)
Total Protein: 7.4 g/dL (ref 6.5–8.1)

## 2024-05-11 LAB — BASIC METABOLIC PANEL WITH GFR
Anion gap: 19 — ABNORMAL HIGH (ref 5–15)
BUN: 13 mg/dL (ref 8–23)
CO2: 24 mmol/L (ref 22–32)
Calcium: 9.9 mg/dL (ref 8.9–10.3)
Chloride: 97 mmol/L — ABNORMAL LOW (ref 98–111)
Creatinine, Ser: 0.92 mg/dL (ref 0.44–1.00)
GFR, Estimated: >60 mL/min (ref >60)
Glucose, Bld: 113 mg/dL — ABNORMAL HIGH (ref 70–99)
Potassium: 3.4 mmol/L — ABNORMAL LOW (ref 3.5–5.1)
Sodium: 139 mmol/L (ref 135–145)

## 2024-05-11 LAB — HEPATIC FUNCTION PANEL
ALT: 13 U/L (ref 0–44)
AST: 20 U/L (ref 15–41)
Albumin: 4.1 g/dL (ref 3.5–5.0)
Alkaline Phosphatase: 58 U/L (ref 38–126)
Bilirubin, Direct: 0.2 mg/dL (ref 0.0–0.2)
Indirect Bilirubin: 0.4 mg/dL (ref 0.3–0.9)
Total Bilirubin: 0.7 mg/dL (ref 0.0–1.2)
Total Protein: 7.6 g/dL (ref 6.5–8.1)

## 2024-05-11 LAB — URINALYSIS, ROUTINE W REFLEX MICROSCOPIC
Bacteria, UA: NONE SEEN
Bilirubin Urine: NEGATIVE
Glucose, UA: NEGATIVE mg/dL
Hgb urine dipstick: NEGATIVE
Ketones, ur: 20 mg/dL — AB
Leukocytes,Ua: NEGATIVE
Nitrite: NEGATIVE
Protein, ur: 30 mg/dL — AB
Specific Gravity, Urine: 1.015 (ref 1.005–1.030)
pH: 5 (ref 5.0–8.0)

## 2024-05-11 LAB — POCT I-STAT 7, (LYTES, BLD GAS, ICA,H+H)
Acid-base deficit: 6 mmol/L — ABNORMAL HIGH (ref 0.0–2.0)
Bicarbonate: 19 mmol/L — ABNORMAL LOW (ref 20.0–28.0)
Calcium, Ion: 1.19 mmol/L (ref 1.15–1.40)
HCT: 39 % (ref 36.0–46.0)
Hemoglobin: 13.3 g/dL (ref 12.0–15.0)
O2 Saturation: 100 %
Patient temperature: 36.9
Potassium: 3.2 mmol/L — ABNORMAL LOW (ref 3.5–5.1)
Sodium: 137 mmol/L (ref 135–145)
TCO2: 20 mmol/L — ABNORMAL LOW (ref 22–32)
pCO2 arterial: 34.4 mmHg (ref 32–48)
pH, Arterial: 7.349 — ABNORMAL LOW (ref 7.35–7.45)
pO2, Arterial: 574 mmHg — ABNORMAL HIGH (ref 83–108)

## 2024-05-11 LAB — CUP PACEART REMOTE DEVICE CHECK
Date Time Interrogation Session: 20250906231744
Implantable Pulse Generator Implant Date: 20240212

## 2024-05-11 LAB — APTT: aPTT: 22 s — ABNORMAL LOW (ref 24–36)

## 2024-05-11 LAB — DIFFERENTIAL
Abs Immature Granulocytes: 0.47 K/uL — ABNORMAL HIGH (ref 0.00–0.07)
Basophils Absolute: 0.2 K/uL — ABNORMAL HIGH (ref 0.0–0.1)
Basophils Relative: 1 %
Eosinophils Absolute: 0.1 K/uL (ref 0.0–0.5)
Eosinophils Relative: 0 %
Immature Granulocytes: 2 %
Lymphocytes Relative: 16 %
Lymphs Abs: 3.8 K/uL (ref 0.7–4.0)
Monocytes Absolute: 1.1 K/uL — ABNORMAL HIGH (ref 0.1–1.0)
Monocytes Relative: 5 %
Neutro Abs: 17.8 K/uL — ABNORMAL HIGH (ref 1.7–7.7)
Neutrophils Relative %: 76 %

## 2024-05-11 LAB — GLUCOSE, CAPILLARY: Glucose-Capillary: 166 mg/dL — ABNORMAL HIGH (ref 70–99)

## 2024-05-11 LAB — CBG MONITORING, ED: Glucose-Capillary: 237 mg/dL — ABNORMAL HIGH (ref 70–99)

## 2024-05-11 LAB — PROTIME-INR
INR: 1.2 (ref 0.8–1.2)
Prothrombin Time: 16.1 s — ABNORMAL HIGH (ref 11.4–15.2)

## 2024-05-11 LAB — ETHANOL: Alcohol, Ethyl (B): <15 mg/dL (ref <15)

## 2024-05-11 LAB — LACTIC ACID, PLASMA: Lactic Acid, Venous: 2.1 mmol/L (ref 0.5–1.9)

## 2024-05-11 LAB — PROCALCITONIN: Procalcitonin: 2.3 ng/mL

## 2024-05-11 LAB — MRSA NEXT GEN BY PCR, NASAL: MRSA by PCR Next Gen: NOT DETECTED

## 2024-05-11 MED ORDER — KETAMINE HCL 50 MG/5ML IJ SOSY
PREFILLED_SYRINGE | INTRAMUSCULAR | Status: AC
  Filled 2024-05-11: qty 10

## 2024-05-11 MED ORDER — LORAZEPAM 2 MG/ML IJ SOLN
INTRAMUSCULAR | Status: AC
  Administered 2024-05-11: 2 mg via INTRAVENOUS
  Filled 2024-05-11: qty 1

## 2024-05-11 MED ORDER — CHLORHEXIDINE GLUCONATE CLOTH 2 % EX PADS
6.0000 | MEDICATED_PAD | Freq: Every day | CUTANEOUS | Status: DC
  Administered 2024-05-11 – 2024-05-18 (×8): 6 via TOPICAL

## 2024-05-11 MED ORDER — FENTANYL CITRATE PF 50 MCG/ML IJ SOSY
25.0000 ug | PREFILLED_SYRINGE | Freq: Once | INTRAMUSCULAR | Status: AC
  Administered 2024-05-11: 25 ug via INTRAVENOUS

## 2024-05-11 MED ORDER — FENTANYL CITRATE PF 50 MCG/ML IJ SOSY: 50.0000 ug | PREFILLED_SYRINGE | INTRAMUSCULAR | Status: DC | PRN

## 2024-05-11 MED ORDER — NYSTATIN 100000 UNIT/GM EX POWD
Freq: Two times a day (BID) | CUTANEOUS | Status: DC
Start: 2024-05-11 — End: 2024-05-19
  Administered 2024-05-12 – 2024-05-15 (×2): 1 via TOPICAL
  Filled 2024-05-11: qty 15

## 2024-05-11 MED ORDER — MIDAZOLAM HCL 2 MG/2ML IJ SOLN
1.0000 mg | INTRAMUSCULAR | Status: DC | PRN
  Administered 2024-05-14: 2 mg via INTRAVENOUS
  Administered 2024-05-14: 1 mg via INTRAVENOUS
  Administered 2024-05-15 (×2): 2 mg via INTRAVENOUS
  Filled 2024-05-11 (×4): qty 2

## 2024-05-11 MED ORDER — DOCUSATE SODIUM 50 MG/5ML PO LIQD: 100.0000 mg | Freq: Two times a day (BID) | ORAL | Status: DC

## 2024-05-11 MED ORDER — LEVETIRACETAM (KEPPRA) 500 MG/5 ML ADULT IV PUSH
4500.0000 mg | Freq: Once | INTRAVENOUS | Status: AC
  Administered 2024-05-11: 4500 mg via INTRAVENOUS
  Filled 2024-05-11: qty 45

## 2024-05-11 MED ORDER — POLYETHYLENE GLYCOL 3350 17 G PO PACK
17.0000 g | PACK | Freq: Every day | ORAL | Status: DC
  Administered 2024-05-11 – 2024-05-14 (×4): 17 g
  Filled 2024-05-11 (×4): qty 1

## 2024-05-11 MED ORDER — POLYETHYLENE GLYCOL 3350 17 G PO PACK: 17.0000 g | PACK | Freq: Every day | ORAL | Status: DC | PRN

## 2024-05-11 MED ORDER — SODIUM CHLORIDE 0.9% FLUSH
3.0000 mL | Freq: Once | INTRAVENOUS | Status: AC
  Administered 2024-05-11: 3 mL via INTRAVENOUS

## 2024-05-11 MED ORDER — ROCURONIUM BROMIDE 10 MG/ML (PF) SYRINGE
PREFILLED_SYRINGE | INTRAVENOUS | Status: AC
  Filled 2024-05-11: qty 10

## 2024-05-11 MED ORDER — INSULIN ASPART 100 UNIT/ML IJ SOLN
0.0000 [IU] | INTRAMUSCULAR | Status: DC
  Administered 2024-05-11 – 2024-05-12 (×2): 2 [IU] via SUBCUTANEOUS
  Administered 2024-05-12: 3 [IU] via SUBCUTANEOUS
  Administered 2024-05-12: 2 [IU] via SUBCUTANEOUS
  Administered 2024-05-12: 3 [IU] via SUBCUTANEOUS
  Administered 2024-05-12: 2 [IU] via SUBCUTANEOUS
  Administered 2024-05-12: 3 [IU] via SUBCUTANEOUS
  Administered 2024-05-12 – 2024-05-13 (×2): 2 [IU] via SUBCUTANEOUS
  Administered 2024-05-13: 3 [IU] via SUBCUTANEOUS

## 2024-05-11 MED ORDER — ETOMIDATE 2 MG/ML IV SOLN
INTRAVENOUS | Status: DC | PRN
Start: 2024-05-11 — End: 2024-05-11
  Administered 2024-05-11: 20 mg via INTRAVENOUS

## 2024-05-11 MED ORDER — FENTANYL 2500MCG IN NS 250ML (10MCG/ML) PREMIX INFUSION
0.0000 ug/h | INTRAVENOUS | Status: DC
  Administered 2024-05-11: 50 ug/h via INTRAVENOUS
  Filled 2024-05-11: qty 250

## 2024-05-11 MED ORDER — FENTANYL BOLUS VIA INFUSION: 25.0000 ug | INTRAVENOUS | Status: DC | PRN

## 2024-05-11 MED ORDER — DOCUSATE SODIUM 100 MG PO CAPS: 100.0000 mg | ORAL_CAPSULE | Freq: Two times a day (BID) | ORAL | Status: DC | PRN

## 2024-05-11 MED ORDER — SUCCINYLCHOLINE CHLORIDE 200 MG/10ML IV SOSY
PREFILLED_SYRINGE | INTRAVENOUS | Status: AC
  Filled 2024-05-11: qty 10

## 2024-05-11 MED ORDER — CEPHALEXIN 500 MG PO CAPS: 500.0000 mg | ORAL_CAPSULE | Freq: Two times a day (BID) | ORAL | 0 refills | Status: DC

## 2024-05-11 MED ORDER — PROPOFOL 1000 MG/100ML IV EMUL
INTRAVENOUS | Status: AC
  Filled 2024-05-11: qty 100

## 2024-05-11 MED ORDER — HYDROCODONE-ACETAMINOPHEN 5-325 MG PO TABS: ORAL_TABLET | ORAL | 0 refills | Status: DC

## 2024-05-11 MED ORDER — ONDANSETRON HCL 4 MG/2ML IJ SOLN
4.0000 mg | Freq: Four times a day (QID) | INTRAMUSCULAR | Status: DC | PRN
  Administered 2024-05-13: 4 mg via INTRAVENOUS
  Filled 2024-05-11: qty 2

## 2024-05-11 MED ORDER — ROCURONIUM BROMIDE 10 MG/ML (PF) SYRINGE
PREFILLED_SYRINGE | INTRAVENOUS | Status: DC | PRN
Start: 2024-05-11 — End: 2024-05-11
  Administered 2024-05-11: 100 mg via INTRAVENOUS

## 2024-05-11 MED ORDER — ORAL CARE MOUTH RINSE: 15.0000 mL | OROMUCOSAL | Status: DC | PRN

## 2024-05-11 MED ORDER — SODIUM CHLORIDE 0.9 % IV BOLUS
1000.0000 mL | Freq: Once | INTRAVENOUS | Status: AC
  Administered 2024-05-11: 1000 mL via INTRAVENOUS

## 2024-05-11 MED ORDER — PROPOFOL 1000 MG/100ML IV EMUL
0.0000 ug/kg/min | INTRAVENOUS | Status: DC
  Administered 2024-05-11: 10 ug/kg/min via INTRAVENOUS
  Administered 2024-05-12: 20 ug/kg/min via INTRAVENOUS
  Filled 2024-05-11 (×2): qty 100

## 2024-05-11 MED ORDER — POLYETHYLENE GLYCOL 3350 17 G PO PACK: 17.0000 g | PACK | Freq: Every day | ORAL | Status: DC

## 2024-05-11 MED ORDER — LORAZEPAM 2 MG/ML IJ SOLN: 2.0000 mg | Freq: Once | INTRAMUSCULAR | Status: AC

## 2024-05-11 MED ORDER — ORAL CARE MOUTH RINSE
15.0000 mL | OROMUCOSAL | Status: DC
  Administered 2024-05-11 – 2024-05-17 (×72): 15 mL via OROMUCOSAL

## 2024-05-11 MED ORDER — SODIUM CHLORIDE 0.9 % IV BOLUS
500.0000 mL | Freq: Once | INTRAVENOUS | Status: AC
Start: 2024-05-12 — End: 2024-05-12
  Administered 2024-05-12: 500 mL via INTRAVENOUS

## 2024-05-11 MED ORDER — PANTOPRAZOLE SODIUM 40 MG IV SOLR
40.0000 mg | Freq: Every day | INTRAVENOUS | Status: DC
  Administered 2024-05-11 – 2024-05-16 (×6): 40 mg via INTRAVENOUS
  Filled 2024-05-11 (×6): qty 10

## 2024-05-11 MED ORDER — DOCUSATE SODIUM 50 MG/5ML PO LIQD
100.0000 mg | Freq: Two times a day (BID) | ORAL | Status: DC
  Administered 2024-05-11 – 2024-05-16 (×10): 100 mg
  Filled 2024-05-11 (×10): qty 10

## 2024-05-11 MED ORDER — IOHEXOL 350 MG/ML SOLN
75.0000 mL | Freq: Once | INTRAVENOUS | Status: AC | PRN
  Administered 2024-05-11: 75 mL via INTRAVENOUS

## 2024-05-11 NOTE — ED Provider Notes (Signed)
 [place] EMERGENCY DEPARTMENT AT [facility] Provider Note   CSN: [id number] Arrival date & time: 05/11/24  9045     Patient presents with: Dysuria   Beth Macdonald is a 78 y.o. female.  {Add pertinent medical, surgical, social history, OB history to YEP:[id number]} Patient is being treated for UTI and complains of back discomfort.   Dysuria      Prior to Admission medications   Medication Sig Start Date End Date Taking? Authorizing Provider  cephALEXin  (KEFLEX ) 500 MG capsule Take 1 capsule (500 mg total) by mouth 2 (two) times daily. 05/11/24  Yes Trinna Kunst, MD  HYDROcodone -acetaminophen  (NORCO/VICODIN) 5-325 MG tablet Take 1 every 6 hours for pain that is not relieved by Tylenol  alone 05/11/24  Yes Tatyana Biber, MD  aspirin  EC 81 MG tablet Take 1 tablet (81 mg total) by mouth daily. Swallow whole. 03/15/24 06/13/24  Christobal Guadalajara, MD  atorvastatin  (LIPITOR) 20 MG tablet Take 1 tablet (20 mg total) by mouth daily. Hold until you are taking Paxlovid  11/16/22   Caleen Qualia, MD  cephALEXin  (KEFLEX ) 500 MG capsule Take 1 capsule (500 mg total) by mouth 2 (two) times daily for 5 days. 05/06/24 05/11/24  Beverley Leita LABOR, PA-C  Cholecalciferol  (VITAMIN D3) 50 MCG (2000 UT) capsule Take 2,000 Units by mouth daily.    [provider]  Clobetasol  Prop Emollient Base (CLOBETASOL  PROPIONATE E) 0.05 % emollient cream Apply 1 Application topically 2 (two) times daily. 04/18/22   Sheffield, Kelli R, PA-C  clopidogrel  (PLAVIX ) 75 MG tablet Take 1 tablet (75 mg total) by mouth daily. 03/16/24 05/15/24  Christobal Guadalajara, MD  cycloSPORINE  (RESTASIS ) 0.05 % ophthalmic emulsion Place 1 drop into both eyes 2 (two) times daily as needed.    [provider]  desonide  (DESOWEN ) 0.05 % cream Apply topically 2 (two) times daily as needed (Rash). 03/22/21   Sheffield, Kelli R, PA-C  donepezil  (ARICEPT ) 5 MG tablet Take 5 mg by mouth at bedtime. Patient taking differently: Take 10 mg by mouth  at bedtime. 03/13/24   [provider]  fluconazole (DIFLUCAN) 150 MG tablet Take 150 mg by mouth daily. 05/07/24 05/15/24  [provider]  lisinopril  (ZESTRIL ) 20 MG tablet Take 1 tablet (20 mg total) by mouth daily. Patient not taking: Reported on 05/05/2024 04/13/24 04/13/25  Christobal Guadalajara, MD  magnesium  oxide (MAG-OX) 400 (240 Mg) MG tablet Take 400 mg by mouth daily.    [provider]  metFORMIN  (GLUCOPHAGE -XR) 500 MG 24 hr tablet Take 1,000 mg by mouth 2 (two) times daily with a meal. Patient taking differently: Take 1,000 mg by mouth daily.    [provider]  Polyethyl Glycol-Propyl Glycol (SYSTANE FREE OP) Apply 1 drop to eye daily as needed (dryness/irritation).    [provider]  REPATHA  SURECLICK 140 MG/ML SOAJ Inject 140 mg into the skin every 14 (fourteen) days. Sunday 10/09/21   [provider]  Semaglutide ,0.25 or 0.5MG /DOS, 2 MG/3ML SOPN Inject 0.5 mg into the skin once a week. Patient taking differently: Inject 1 mg into the skin once a week.    [provider]    Allergies: Clindamycin/lincomycin, Erythromycin, Milk-related compounds, and Tetracyclines & related    Review of Systems  Genitourinary:  Positive for dysuria.    Updated Vital Signs BP (!) 198/81 (BP Location: Right Arm)   Pulse 77   Temp 98.6 F (37 C) (Oral)   Resp 18   SpO2 94%   Physical Exam  (  all labs ordered are listed, but only abnormal results are displayed) Labs Reviewed  URINALYSIS, ROUTINE W REFLEX MICROSCOPIC - Abnormal; Notable for the following components:      Result Value   Ketones, ur 20 (*)    Protein, ur 30 (*)    All other components within normal limits  BASIC METABOLIC PANEL WITH GFR - Abnormal; Notable for the following components:   Potassium 3.4 (*)    Chloride 97 (*)    Glucose, Bld 113 (*)    Anion gap 19 (*)    All other components within normal limits  CBC - Abnormal; Notable for the following components:   WBC  13.5 (*)    All other components within normal limits  URINE CULTURE  HEPATIC FUNCTION PANEL    EKG: None  Radiology: CT Renal Stone Study Result Date: 05/11/2024 CLINICAL DATA:  Recently diagnosed UTI with persistent burning with urination and lower back pain. EXAM: CT ABDOMEN AND PELVIS WITHOUT CONTRAST TECHNIQUE: Multidetector CT imaging of the abdomen and pelvis was performed following the standard protocol without IV contrast. RADIATION DOSE REDUCTION: This exam was performed according to the departmental dose-optimization program which includes automated exposure control, adjustment of the mA and/or kV according to patient size and/or use of iterative reconstruction technique. COMPARISON:  March 03, 2007 FINDINGS: Lower chest: A stable, benign 5 mm anterior right middle lobe pulmonary nodule is seen. Hepatobiliary: No focal liver abnormality is seen. No gallstones, gallbladder wall thickening, or biliary dilatation. Pancreas: Unremarkable. No pancreatic ductal dilatation or surrounding inflammatory changes. Spleen: Normal in size without focal abnormality. Adrenals/Urinary Tract: Adrenal glands are unremarkable. Kidneys are normal in size, without renal calculi, focal lesion, or hydronephrosis. Mild urinary bladder wall thickening is seen along the anterior aspect of the bladder dome. A mild amount of adjacent inflammatory fat stranding is present (coronal reformatted images 36 through 52, CT series 6). Stomach/Bowel: Stomach is within normal limits. Appendix appears normal. No evidence of bowel wall thickening, distention, or inflammatory changes. Noninflamed diverticula are seen throughout the sigmoid colon. Vascular/Lymphatic: Aortic atherosclerosis. No enlarged abdominal or pelvic lymph nodes. Reproductive: Uterus and bilateral adnexa are unremarkable. Other: No abdominal wall hernia or abnormality. No abdominopelvic ascites. Musculoskeletal: Multilevel degenerative changes are seen throughout the  thoracic spine IMPRESSION: 1. Mild urinary bladder wall thickening with adjacent inflammatory fat stranding, consistent with cystitis. Correlation with urinalysis is recommended. 2. Sigmoid diverticulosis. 3. Aortic atherosclerosis. Electronically Signed   By: Suzen Dials M.D.   On: 05/11/2024 12:47    {Document cardiac monitor, telemetry assessment procedure when appropriate:32947} Procedures   Medications Ordered in the ED  sodium chloride  0.9 % bolus 1,000 mL (1,000 mLs Intravenous New Bag/Given 05/11/24 1329)      {Click here for ABCD2, HEART and other calculators REFRESH Note before signing:1}                              Medical Decision Making Amount and/or Complexity of Data Reviewed Labs: ordered. Radiology: ordered.  Risk Prescription drug management.   Patient with UTI and back pain.  Labs improving.  Patient will be given an additional 5 days of Keflex  and some pain medicine for back and follow-up with PCP  {Document critical care time when appropriate  Document review of labs and clinical decision tools ie CHADS2VASC2, etc  Document your independent review of radiology images and any outside records  Document your discussion with family members, caretakers  and with consultants  Document social determinants of health affecting pt's care  Document your decision making why or why not admission, treatments were needed:32947:::1}   Final diagnoses:  Lower urinary tract infectious disease    ED Discharge Orders          Ordered    cephALEXin  (KEFLEX ) 500 MG capsule  2 times daily        05/11/24 1408    HYDROcodone -acetaminophen  (NORCO/VICODIN) 5-325 MG tablet        05/11/24 1409

## 2024-05-11 NOTE — ED Provider Notes (Signed)
 [place] EMERGENCY DEPARTMENT AT [facility] Provider Note   CSN: [id number] Arrival date & time: 05/11/24  1759  An emergency department physician performed an initial assessment on this suspected stroke patient at 56.  Patient presents with: Code Stroke   Beth Macdonald is a 78 y.o. female.   78 year old female presents for evaluation of left-sided weakness.  Last known well was around 3 PM.  Per EMS patient's wife said she is usually functional but developed some left-sided weakness and some shaking on the left.  EMS states that she has some aphasia and slurred speech and when he looked to the right side.  Patient does answer some questions appropriately but seems confused.  Neurology at bedside on patient's arrival.        Prior to Admission medications   Medication Sig Start Date End Date Taking? Authorizing Provider  aspirin  EC 81 MG tablet Take 1 tablet (81 mg total) by mouth daily. Swallow whole. 03/15/24 06/13/24  Christobal Guadalajara, MD  atorvastatin  (LIPITOR) 20 MG tablet Take 1 tablet (20 mg total) by mouth daily. Hold until you are taking Paxlovid  11/16/22   Caleen Qualia, MD  cephALEXin  (KEFLEX ) 500 MG capsule Take 1 capsule (500 mg total) by mouth 2 (two) times daily for 5 days. 05/06/24 05/11/24  Beverley Leita LABOR, PA-C  cephALEXin  (KEFLEX ) 500 MG capsule Take 1 capsule (500 mg total) by mouth 2 (two) times daily. 05/11/24   Zammit, Joseph, MD  Cholecalciferol  (VITAMIN D3) 50 MCG (2000 UT) capsule Take 2,000 Units by mouth daily.    [provider]  Clobetasol  Prop Emollient Base (CLOBETASOL  PROPIONATE E) 0.05 % emollient cream Apply 1 Application topically 2 (two) times daily. 04/18/22   Sheffield, Kelli R, PA-C  clopidogrel  (PLAVIX ) 75 MG tablet Take 1 tablet (75 mg total) by mouth daily. 03/16/24 05/15/24  Christobal Guadalajara, MD  cycloSPORINE  (RESTASIS ) 0.05 % ophthalmic emulsion Place 1 drop into both eyes 2 (two) times daily as needed.    [provider]   desonide  (DESOWEN ) 0.05 % cream Apply topically 2 (two) times daily as needed (Rash). 03/22/21   Sheffield, Kelli R, PA-C  donepezil  (ARICEPT ) 5 MG tablet Take 5 mg by mouth at bedtime. Patient taking differently: Take 10 mg by mouth at bedtime. 03/13/24   [provider]  fluconazole (DIFLUCAN) 150 MG tablet Take 150 mg by mouth daily. 05/07/24 05/15/24  [provider]  HYDROcodone -acetaminophen  (NORCO/VICODIN) 5-325 MG tablet Take 1 every 6 hours for pain that is not relieved by Tylenol  alone 05/11/24   Zammit, Joseph, MD  lisinopril  (ZESTRIL ) 20 MG tablet Take 1 tablet (20 mg total) by mouth daily. Patient not taking: Reported on 05/05/2024 04/13/24 04/13/25  Christobal Guadalajara, MD  magnesium  oxide (MAG-OX) 400 (240 Mg) MG tablet Take 400 mg by mouth daily.    [provider]  metFORMIN  (GLUCOPHAGE -XR) 500 MG 24 hr tablet Take 1,000 mg by mouth 2 (two) times daily with a meal. Patient taking differently: Take 1,000 mg by mouth daily.    [provider]  Polyethyl Glycol-Propyl Glycol (SYSTANE FREE OP) Apply 1 drop to eye daily as needed (dryness/irritation).    [provider]  REPATHA  SURECLICK 140 MG/ML SOAJ Inject 140 mg into the skin every 14 (fourteen) days. Sunday 10/09/21   [provider]  Semaglutide ,0.25 or 0.5MG /DOS, 2 MG/3ML SOPN Inject 0.5 mg into the skin once a week. Patient taking differently: Inject 1 mg into the skin once a week.  [provider]    Allergies: Clindamycin/lincomycin, Erythromycin, Milk-related compounds, and Tetracyclines & related    Review of Systems  Reason unable to perform ROS: Unable to be obtained due to patient's mental status, aphasia.    Updated Vital Signs BP (!) 127/91   Pulse (!) 106   Temp 98.5 F (36.9 C) (Axillary)   Resp 16   Ht 5' 8 (1.727 m)   Wt 76 kg   SpO2 97%   BMI 25.48 kg/m   Physical Exam Vitals and nursing note reviewed.  Constitutional:      General: She is not in  acute distress.    Appearance: She is well-developed. She is ill-appearing.  HENT:     Head: Normocephalic and atraumatic.  Eyes:     Conjunctiva/sclera: Conjunctivae normal.  Cardiovascular:     Rate and Rhythm: Normal rate and regular rhythm.     Heart sounds: No murmur heard. Pulmonary:     Effort: Pulmonary effort is normal. No respiratory distress.     Breath sounds: Normal breath sounds.  Abdominal:     Palpations: Abdomen is soft.     Tenderness: There is no abdominal tenderness.  Musculoskeletal:        General: No swelling.     Cervical back: Neck supple.  Skin:    General: Skin is warm and dry.     Capillary Refill: Capillary refill takes less than 2 seconds.  Neurological:     Mental Status: She is alert.     Comments: Patient with rightward gaze, she answers questions when I speak to her on the right side but does not respond on the left, left-sided neglect, there is left arm and leg weakness but there is some clonus in the left lower extremity, possible seizure activity     (all labs ordered are listed, but only abnormal results are displayed) Labs Reviewed  PROTIME-INR - Abnormal; Notable for the following components:      Result Value   Prothrombin Time 16.1 (*)    All other components within normal limits  APTT - Abnormal; Notable for the following components:   aPTT 22 (*)    All other components within normal limits  CBC - Abnormal; Notable for the following components:   WBC 23.5 (*)    All other components within normal limits  DIFFERENTIAL - Abnormal; Notable for the following components:   Neutro Abs 17.8 (*)    Monocytes Absolute 1.1 (*)    Basophils Absolute 0.2 (*)    Abs Immature Granulocytes 0.47 (*)    All other components within normal limits  COMPREHENSIVE METABOLIC PANEL WITH GFR - Abnormal; Notable for the following components:   Chloride 97 (*)    CO2 10 (*)    Glucose, Bld 248 (*)    Creatinine, Ser 1.50 (*)    Albumin  3.3 (*)     GFR, Estimated 35 (*)    Anion gap 33 (*)    All other components within normal limits  GLUCOSE, CAPILLARY - Abnormal; Notable for the following components:   Glucose-Capillary 166 (*)    All other components within normal limits  I-STAT CHEM 8, ED - Abnormal; Notable for the following components:   Potassium 3.4 (*)    Creatinine, Ser 1.10 (*)    Glucose, Bld 233 (*)    Calcium , Ion 1.08 (*)    TCO2 14 (*)    Hemoglobin 16.0 (*)    HCT 47.0 (*)    All other  components within normal limits  CBG MONITORING, ED - Abnormal; Notable for the following components:   Glucose-Capillary 237 (*)    All other components within normal limits  POCT I-STAT 7, (LYTES, BLD GAS, ICA,H+H) - Abnormal; Notable for the following components:   pH, Arterial 7.349 (*)    pO2, Arterial 574 (*)    Bicarbonate 19.0 (*)    TCO2 20 (*)    Acid-base deficit 6.0 (*)    Potassium 3.2 (*)    All other components within normal limits  MRSA NEXT GEN BY PCR, NASAL  CULTURE, BLOOD (ROUTINE X 2)  CULTURE, BLOOD (ROUTINE X 2)  ETHANOL  CBC  BASIC METABOLIC PANEL WITH GFR  MAGNESIUM   PHOSPHORUS  TRIGLYCERIDES  BLOOD GAS, ARTERIAL  LACTIC ACID, PLASMA  PROCALCITONIN  I-STAT CG4 LACTIC ACID, ED  I-STAT CG4 LACTIC ACID, ED    EKG: None  Radiology:    Procedures   Medications Ordered in the ED  rocuronium  (ZEMURON ) 100 MG/10ML injection (has no administration in time range)  succinylcholine  (ANECTINE ) 200 MG/10ML syringe (has no administration in time range)  propofol  (DIPRIVAN ) 1000 MG/100ML infusion (20 mcg/kg/min  76 kg Intravenous Infusion Verify 05/11/24 2200)  propofol  (DIPRIVAN ) 1000 MG/100ML infusion (has no administration in time range)  Chlorhexidine  Gluconate Cloth 2 % PADS 6 each (6 each Topical Given 05/11/24 2115)  pantoprazole  (PROTONIX ) injection 40 mg (40 mg Intravenous Given 05/11/24 2124)  ondansetron  (ZOFRAN ) injection 4 mg (has no administration in time range)  insulin  aspart (novoLOG )  injection 0-9 Units (2 Units Subcutaneous Given 05/11/24 2122)  midazolam  (VERSED ) injection 1-2 mg (has no administration in time range)  Oral care mouth rinse (15 mLs Mouth Rinse Given 05/11/24 2156)  Oral care mouth rinse (has no administration in time range)  nystatin  (MYCOSTATIN /NYSTOP ) topical powder ( Topical Given 05/11/24 2222)  docusate (COLACE) 50 MG/5ML liquid 100 mg (100 mg Per Tube Given 05/11/24 2123)  polyethylene glycol (MIRALAX  / GLYCOLAX ) packet 17 g (17 g Per Tube Given 05/11/24 2123)  fentaNYL  in NS (30mcg/ml) infusion-PREMIX (50 mcg/hr Intravenous Infusion Verify 05/11/24 2200)  fentaNYL  (SUBLIMAZE ) bolus via infusion 25-100 mcg (has no administration in time range)  sodium chloride  flush (NS) 0.9 % injection 3 mL (3 mLs Intravenous Given 05/11/24 1903)  LORazepam  (ATIVAN ) injection 2 mg (2 mg Intravenous Given 05/11/24 1800)  levETIRAcetam  (KEPPRA ) undiluted injection 4,500 mg (4,500 mg Intravenous Given 05/11/24 1815)  iohexol  (OMNIPAQUE ) 350 MG/ML injection 75 mL (75 mLs Intravenous Contrast Given 05/11/24 1826)  fentaNYL  (SUBLIMAZE ) injection 25-50 mcg (25 mcg Intravenous Given 05/11/24 2127)                                    Medical Decision Making Cardiac monitor interpretation: Sinus tachycardia, no ectopy  Patient arrived as a stroke alert, she was noticed to have left-sided focal seizure activity.  She was taken immediately to CT scanner.  Neurology ordered Ativan  and Keppra .  On reevaluation patient was not protecting her airway, semiunresponsive likely from the Ativan .  She became tachycardic.  Decision was made to subsequently intubate the patient.  She was given etomidate  and rocuronium  for RSI.  Please see separate procedure note from the PA for details on the procedure.  Patient was started on a propofol  drip.  I did discussion with critical care as well as neurology patient will be mated to the ICU for further workup and management  and neurology will plan to  consult and get EEG and MRI when able.  I do long discussion with patient's spouse and friend at bedside and they are agreeable with plan for admission.  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    Details: Previous ER visits reviewed and patient was recently admitted for stroke workup about a month ago Labs: ordered. Decision-making details documented in ED Course.    Details: Ordered and reviewed by me and patient with significant leukocytosis, labs fairly unremarkable otherwise Radiology: ordered and independent interpretation performed. Decision-making details documented in ED Course.    Details: CT head, CT angiogram of the head and neck ordered and reviewed by me and show evidence of new M2 occlusion, these were also discussed with neurology ECG/medicine tests: ordered and independent interpretation performed. Decision-making details documented in ED Course.    Details: Ordered and interpreted by me in the absence of cardiology and shows sinus tachycardia, no STEMI and no acute change when compared to prior Discussion of management or test interpretation with external provider(s): Neurology attending on call-I spoke with him in person and he evaluated the patient at bedside and recommended admission for MRI.  He gave the patient Ativan  and a Keppra  load  Dr. Marshall-critical care-I spoke with her on the phone regarding the patient's case that she will admit the patient to the ICU for further workup and management  Risk OTC drugs. Prescription drug  management. Parenteral controlled substances. Drug therapy requiring intensive monitoring for toxicity. Decision regarding hospitalization. Risk Details: CRITICAL CARE Performed by: Duwaine LITTIE Fusi   Total critical care time: 77 minutes  Critical care time was exclusive of separately billable procedures and treating other patients.  Critical care was necessary to treat or prevent imminent or life-threatening deterioration.  Critical care was time spent personally by me on the following activities: development of treatment plan with patient and/or surrogate as well as nursing, discussions with consultants, evaluation of patient's response to treatment, examination of patient, obtaining history from patient or surrogate, ordering and performing treatments and interventions, ordering and review of laboratory studies, ordering and review of radiographic studies, pulse oximetry and re-evaluation of patient's condition.      Final diagnoses:  Acute respiratory failure, unspecified whether with hypoxia or hypercapnia (HCC)  Focal seizures [facility])    ED Discharge Orders     None          Fusi Duwaine LITTIE, DO 05/11/24 2330

## 2024-05-11 NOTE — Discharge Instructions (Signed)
 Take the additional antibiotics after you finish your initial antibiotics.  Follow-up with your doctor in a week for recheck.

## 2024-05-11 NOTE — ED Triage Notes (Signed)
 Pt BIBA from home for continuing malaise. Pt has been declining a few months. Dx with UTI, on last day of abx but still having burning with urination and possible incontinence. Reports low back pain. Denies CP, SHOB. 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   164/90 HR 82 CBG 128 98 temp 95% RA

## 2024-05-11 NOTE — H&P (Signed)
 NAME:  Beth Macdonald, MRN:  [id number], DOB:  07/26/1946, LOS: 0 ADMISSION DATE:  05/11/2024 CONSULTATION DATE:  05/11/2024 REFERRING MD:  Gennaro - EDP, CHIEF COMPLAINT: Code Stroke, seizures   History of Present Illness:  78 year old woman who presented to [facility] ED 9/8 via EMS as a Code Stroke, concern for seizure-like activity. PMHx significant for HLD, CVA (recent L frontal infarct), TIAs, Bell's palsy, DVT, T2DM c/b neuropathy, SCC (skin). Presented to [facility] earlier on day of admission for low back pain and UTI, prescribed additional-day course of Keflex .   Patient is intubated, therefore history is obtained primarily from chart review and family (wife, [name]) at bedside. Patient's wife states that she had recently been feeling poorly with concern for UTI, states kidneys were a concern due to associated back pain. Presented to [facility] earlier in the day 9/8; wife states she noticed rhythmic L shoulder movement c/f seizure. No history of seizures. This worsened at home and speech became slurred. Patient was then BIB EMS from home with seizure-like activity, weakness and R gaze preference. LKW 1500. NIHSS 38 with symptoms including decreased LOC with disorientation/inability to follow commands, R gaze preference, L facial droop, bilateral UE/LE weakness, global aphasia/dysarthria. Labs were notable for WBC 23.5, Hgb/Plt WNL. INR 1.2. Na 140, K 3.5, CO2 10, BUN/Cr 13/1.50 (baseline 1.3-1.5), LFTs WNL. Ethanol WNL. UA earlier in the day with trace ketones and protein. CT Head was negative for ICH, +chronic small vessel disease with parenchymal volume loss and encephalomalacia of the L frontal lobe/R parietal lobe 2/2 remote infarcts. 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 Neurology was consulted.   Patient was administered Ativan  2mg  and Keppra  load initiated.  Patient mental status declined and she was no longer able to protect her airway; she was subsequently intubated in ED.  PCCM consulted for admission.  Pertinent Medical History:   Past Medical History:  Diagnosis Date   Aortic atherosclerosis (HCC)    Diabetes mellitus without complication (HCC)    Type 2   Diabetic neuropathy (HCC)    DVT of proximal lower limb (HCC)    Facial paralysis/Bells palsy    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HCC)    Hyperlipidemia    Mitral valve insufficiency    Osteoarthritis    SCCA (squamous cell carcinoma) of skin 03/22/2021   Bridge of nose (in situ)   Skull fracture (HCC)    age 23   Squamous cell carcinoma of skin 03/22/2021   Right forehead (in situ)   TIA (transient ischemic attack) 2020   Varicose vein of leg    Significant Hospital Events: Including procedures, antibiotic start and stop dates in addition to other pertinent events   9/8 - Presented to [facility] ED for Code Stroke, seizure-like activity. Previously seen at [facility] ED for UTI, prescribed additional Keflex  x 5-day course. CT Head/CTA Head/Neck as above. Received Ativan /Keppra  load. Required intubation for airway protection. PCCM consulted.  Interim History / Subjective:  PCCM consulted for ICU admission.  Objective:  Blood pressure (!) 142/82, pulse (!) 127, temperature (!) 97.5 F (36.4 C), temperature source Axillary, resp. rate 16, height 5' 8 (1.727 m), weight 76 kg, SpO2 100%.    Vent Mode: PRVC FiO2 (%):  [100 %] 100 % Set Rate:  [16 bmp] 16 bmp Vt Set:  [510 mL] 510 mL PEEP:  [5 cmH20] 5 cmH20 Plateau Pressure:  [  13 cmH20] 13 cmH20  No intake or output data in the 24 hours ending 05/11/24 1930 Filed Weights   05/11/24 1854  Weight: 76 kg   Physical Examination: General: Acutely ill-appearing elderly woman in NAD. HEENT: [place]/AT, anicteric sclera, PERRL 5mm, moist mucous membranes. Scant blood in oropharynx. Neuro: Intubated, sedated. Does not respond to verbal,  tactile or noxious stimuli. Does not withdraw to pain. Not following commands. No spontaneous movement of extremities on my exam. +Corneal, +Cough, and +Gag  CV: Mildly tachycardic to 100s, no m/g/r. PULM: Breathing even and unlabored on vent (PEEP 5, FiO2 40%). Lung fields CTAB. GI: Soft, nontender, nondistended. Normoactive bowel sounds. Extremities: No LE edema noted. Skin: Warm/dry, no rashes.  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 Two clusters of seizure-like activity noted (home, ED). CT Head negative for ICH, +chronic small vessel disease with parenchymal volume loss and encephalomalacia of the L frontal lobe/R parietal lobe 2/2 remote infarcts. 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 - Admit to 4N Neuro ICU - Neuro following, appreciate recommendations - F/u MRI Brain, loop recorder noted to be in place, had recent MRI 9/3 with no issue - LTM EEG - AEDs per Neuro (Keppra , Versed  PRN) - Seizure precautions - Frequent neuro checks - Neuroprotective measures: HOB > 30 degrees, normoglycemia, normothermia, electrolytes WNL - PT/OT/SLP when able to participate in care  Acute respiratory insufficiency in the setting of obtundation requiring mechanical ventilation - Continue full vent support (4-8cc/kg IBW) - Wean FiO2 for O2 sat > 90% - Daily WUA/SBT once appropriate from a mental status standpoint - VAP bundle - Pulmonary hygiene - PAD protocol for sedation: Propofol  and Fentanyl  for goal RASS 0 to -1; low threshold to change Prop to alternative agent given history of elevated TG, will trend  HLD Mitral valve  insufficiency, mild Recent lipid panel 8/9 with total cholesterol 120, TG 164, HDL/LDL/VLDL WNL. - Consider repeat Echo - Resume ASA/Plavix /statin as clinically appropriate - Hold home Repatha  for now - Cardiac monitoring  T2DM Diabetic peripheral neuropathy - SSI - CBGs Q4H - Goal CBG 140-180 - Hold home metformin , semaglutide   Recent UTI Prescribed Keflex  x additional 5-day course for persistent symptoms (initial course 9/3-9/8). ?Cephalexin  lowering seizure threshold, but not an agent strongly associated with this in the absence of high doses/significant renal impairment. - UA from [facility] ED 9/8AM unimpressive - Hold cephalexin  for now, consider alternative agent - Trend WBC, fever curve - F/u Cx data  Dementia, baseline - Resume home Aricept   Labs:  CBC: Recent Labs  Lab 05/05/24 2130 05/11/24 1030 05/11/24 1800 05/11/24 1805  WBC 14.0* 13.5* 23.5*  --   NEUTROABS 11.4*  --  17.8*  --   HGB 14.4 14.8 14.2 16.0*  HCT 42.4 43.9 45.7 47.0*  MCV 91.8 90.5 98.7  --   PLT 318 345 389  --    Basic Metabolic Panel: Recent Labs  Lab 05/05/24 2130 05/11/24 1030 05/11/24 1800 05/11/24 1805  NA 139 139 140 138  K 3.7 3.4* 3.5 3.4*  CL 102 97* 97* 102  CO2 20* 24 10*  --   GLUCOSE 122* 113* 248* 233*  BUN 16 13 13  16  CREATININE 1.51* 0.92 1.50* 1.10*  CALCIUM  9.7 9.9 9.5  --    GFR: Estimated Creatinine Clearance: 41.1 mL/min (A) (by C-G formula based on SCr of 1.1 mg/dL (H)). Recent Labs  Lab 05/05/24 2130 05/11/24 1030 05/11/24 1800  WBC 14.0* 13.5* 23.5*   Liver Function Tests: Recent Labs  Lab 05/11/24 1030 05/11/24 1800  AST 20 37  ALT 13 17  ALKPHOS 58 52  BILITOT 0.7 0.8  PROT 7.6 7.4  ALBUMIN  4.1 3.3*   No results for input(s): LIPASE, AMYLASE in the last 168 hours. No results for input(s): AMMONIA in the last 168 hours.  ABG:    Component Value Date/Time   TCO2 14 (L) 05/11/2024 1805    Coagulation Profile: Recent Labs  Lab  05/11/24 1800  INR 1.2   Cardiac Enzymes: No results for input(s): CKTOTAL, CKMB, CKMBINDEX, TROPONINI in the last 168 hours.  HbA1C: Hgb A1c MFr Bld  Date/Time Value Ref Range Status  03/14/2024 09:36 PM 8.4 (H) 4.8 - 5.6 % Final    Comment:    (NOTE)         Prediabetes: 5.7 - 6.4         Diabetes: >6.4         Glycemic control for adults with diabetes: <7.0   11/15/2022 04:36 AM 6.7 (H) 4.8 - 5.6 % Final    Comment:    (NOTE)         Prediabetes: 5.7 - 6.4         Diabetes: >6.4         Glycemic control for adults with diabetes: <7.0    CBG: Recent Labs  Lab 05/11/24 1759  GLUCAP 237*   Review of Systems:   Patient is encephalopathic and/or intubated; therefore, history has been obtained from chart review.   Past Medical History:  She,  has a past medical history of Aortic atherosclerosis (HCC), Diabetes mellitus without complication (HCC), Diabetic neuropathy (HCC), DVT of proximal lower limb (HCC), Facial paralysis/Bells palsy, 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HCC), Hyperlipidemia, Mitral valve insufficiency, Osteoarthritis, SCCA (squamous cell carcinoma) of skin (03/22/2021), Skull fracture (HCC), Squamous cell carcinoma of skin (03/22/2021), TIA (transient ischemic attack) (2020), and Varicose vein of leg.   Surgical History:   Past Surgical History:  Procedure Laterality Date   ELBOW SURGERY Bilateral    20 yrs ago   ENDOVENOUS ABLATION SAPHENOUS VEIN W/ LASER Bilateral    ENDOVENOUS ABLATION SAPHENOUS VEIN W/ LASER Left 11/19/2019   endovenous laser ablation anterior accessory branch of left GSV and stab phlebectomy> 20 incisions left leg by Medford Blade MD    shoulder Bilateral    [phone]   TOENAIL EXCISION Right 2019   Big toe   TONSILLECTOMY Bilateral    TOTAL KNEE ARTHROPLASTY Right 01/01/2022   Procedure: TOTAL KNEE ARTHROPLASTY;  Surgeon: Melodi Lerner, MD;  Location: [facility] ORS;  Service: Orthopedics;  Laterality: Right;   Social  History:   reports that she quit smoking about 45 years ago. Her smoking use included cigarettes. She started smoking about 65 years ago. She has a 20 pack-year smoking history. She has never used smokeless tobacco. She reports current alcohol use. She reports that she does not use drugs.   Family History:  Her family history includes Dementia in her father; Hypertension in her father; Leukemia in her brother; Stroke in her brother and mother. There is no history of Colon cancer, Esophageal cancer, Pancreatic cancer, or Stomach cancer.   Allergies:  Allergies  Allergen Reactions   Clindamycin/Lincomycin Other (See Comments)    Cannot tolerate mycins causes C-Diff   Erythromycin     Other Reaction(s): Not available  erythromycin   Milk-Related Compounds Diarrhea   Tetracyclines & Related Other (See Comments)    Feel really nervous   Home Medications: Prior to Admission medications   Medication Sig Start Date End Date Taking? Authorizing Provider  aspirin  EC 81 MG tablet Take 1 tablet (81 mg total) by mouth daily. Swallow whole. 03/15/24 06/13/24  Christobal Guadalajara, MD  atorvastatin  (LIPITOR) 20 MG tablet Take 1 tablet (20 mg total) by mouth daily. Hold until you are taking Paxlovid  11/16/22   Amin, Sumayya, MD  cephALEXin  (KEFLEX ) 500 MG capsule Take 1 capsule (500 mg total) by mouth 2 (two) times daily for 5 days. 05/06/24 05/11/24  Beverley Leita LABOR, PA-C  cephALEXin  (KEFLEX ) 500 MG capsule Take 1 capsule (500 mg total) by mouth 2 (two) times daily. 05/11/24   Zammit, Joseph, MD  Cholecalciferol  (VITAMIN D3) 50 MCG (2000 UT) capsule Take 2,000 Units by mouth daily.    [provider]  Clobetasol  Prop Emollient Base (CLOBETASOL  PROPIONATE E) 0.05 % emollient cream Apply 1 Application topically 2 (two) times daily. 04/18/22   Sheffield, Kelli R, PA-C  clopidogrel  (PLAVIX ) 75 MG tablet Take 1 tablet (75 mg total) by mouth daily. 03/16/24 05/15/24  Christobal Guadalajara, MD  cycloSPORINE  (RESTASIS ) 0.05 %  ophthalmic emulsion Place 1 drop into both eyes 2 (two) times daily as needed.    [provider]  desonide  (DESOWEN ) 0.05 % cream Apply topically 2 (two) times daily as needed (Rash). 03/22/21   Sheffield, Kelli R, PA-C  donepezil  (ARICEPT ) 5 MG tablet Take 5 mg by mouth at bedtime. Patient taking differently: Take 10 mg by mouth at bedtime. 03/13/24   [provider]  fluconazole (DIFLUCAN) 150 MG tablet Take 150 mg by mouth daily. 05/07/24 05/15/24  [provider]  HYDROcodone -acetaminophen  (NORCO/VICODIN) 5-325 MG tablet Take 1 every 6 hours for pain that is not relieved by Tylenol  alone 05/11/24   Zammit, Joseph, MD  lisinopril  (ZESTRIL ) 20 MG tablet Take 1 tablet (20 mg total) by mouth daily. Patient not taking: Reported on 05/05/2024 04/13/24 04/13/25  Christobal Guadalajara, MD  magnesium  oxide (MAG-OX) 400 (240 Mg) MG tablet Take 400 mg by mouth daily.    [provider]  metFORMIN  (GLUCOPHAGE -XR) 500 MG 24 hr tablet Take 1,000 mg by mouth 2 (two) times daily with a meal. Patient taking differently: Take 1,000 mg by mouth daily.    [provider]  Polyethyl Glycol-Propyl Glycol (SYSTANE FREE OP) Apply 1 drop to eye daily as needed (dryness/irritation).    [provider]  REPATHA  SURECLICK 140 MG/ML SOAJ Inject 140 mg into the skin every 14 (fourteen) days. Sunday 10/09/21   [provider]  Semaglutide ,0.25 or 0.5MG /DOS, 2 MG/3ML SOPN Inject 0.5 mg into the skin once a week. Patient taking differently: Inject 1 mg into the skin once a week.    [provider]   Critical care time:   The patient is critically ill with multiple organ system failure and requires high complexity decision making for assessment and support, frequent evaluation and titration of therapies, advanced monitoring, review of radiographic studies and interpretation of complex data.   Critical Care Time devoted to patient care services, exclusive of separately billable  procedures, described in this note is 41 minutes.  Corean CHRISTELLA Oreoluwa Aigner, PA-C [facility]  05/11/24 7:30 PM  Please see [email or web address] for pager details.  From 7A-7P if no response, please call [phone] After hours, please call ELink [phone]

## 2024-05-11 NOTE — Progress Notes (Signed)
 RT NOTE:  Pt transported to 4N16 on vent without event. Report given to Tim, RRT.

## 2024-05-11 NOTE — ED Provider Notes (Signed)
 Procedure Name: Intubation Date/Time: 05/11/2024 6:45 PM  Performed by: Francis Ileana SAILOR, PA-CPre-anesthesia Checklist: Patient identified, Patient being monitored, Emergency Drugs available, Timeout performed and Suction available Oxygen Delivery Method: Ambu bag Preoxygenation: Pre-oxygenation with 100% oxygen Induction Type: Rapid sequence, Cricoid Pressure applied and IV induction Ventilation: Mask ventilation without difficulty Laryngoscope Size: Mac and 3 Grade View: Grade II Tube size: 7.0 mm Number of attempts: 1 Airway Equipment and Method: Stylet Placement Confirmation: ETT inserted through vocal cords under direct vision, CO2 detector and Breath sounds checked- equal and bilateral Secured at: 24 cm Tube secured with: ETT holder Difficulty Due To: Difficult Airway- due to anterior larynx        Francis Ileana SAILOR, PA-C 05/11/24 1847    Kammerer, Megan L, DO 05/11/24 2324

## 2024-05-11 NOTE — Progress Notes (Signed)
 Patient had inpt room assigned.  EEG set will take place in the inpatient room,due to pt being moved. Patient has been measure and marked for EEG application.  Family at bedside states she has metal in chest, a loop.

## 2024-05-11 NOTE — Progress Notes (Signed)
 eLink Physician-Brief Progress Note Patient Name: Beth Macdonald DOB: 03-Dec-1945 MRN: [id number]   Date of Service  05/11/2024  HPI/Events of Note  78/F with history of stroke, seen earlier at [facility] for UTI, presents this evening for possible seizures. On arrival to the ED, she was started on ativan , loaded with Keppra . CT head showed prior strokes. During her course in the ED, she was noted to have increased lethargy,  amd was ultimately intubated for airway protection.   eICU Interventions  Seizures - ?Etiology   - CT head reported to have R MVA M2 occlusion, believed to be progression of stenosis rather than acute occlusion   - No hypoglycemia   - No severe electrolyte abnormalities.    - ?sepsis      - With leukocytosis, but this may be reactive in nature      - No fevers noted.       - Most recent UA shows no pyuria. CXR without new infiltrate.      - Will check lactate, procalcitonin.      - Will continue to monitor for now. Maintain low threshold to initiate antibiotics if with evidence of acute infection.  - Intubated for airway protection - Will continue sedation, plan to give ativan  for breakthrough seizures.  - Continue Keppra .  - Planned for MRI/EEG - Will follow furhther neurology plans/recommendations  Acute respiratory failure - TV 4-3ml/kg PBW, target plateau pressures <30 - Titrate FiO2, PEEP to target SpO2 >90% - Serial ABG, will make further vent setting changes as warranted.  - Plan for daily sedation interruption to facilitate evaluation of mental status.         Beth Macdonald 05/11/2024, 9:22 PM

## 2024-05-11 NOTE — Sedation Documentation (Signed)
 24

## 2024-05-11 NOTE — Consult Note (Signed)
 NEUROLOGY CONSULT NOTE   Date of service: May 11, 2024 Patient Name: Beth Macdonald MRN:  [id number] DOB:  05-02-1946 Chief Complaint: Seizure Requesting Provider: Gennaro Duwaine CROME, DO  History of Present Illness  Beth Macdonald is a 78 y.o. 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 She got home at 1500 and then brought in as a code stroke for L sided weakness, R gaze. She reportedly had a 30-40 sec seizure at home, another seizure enroute again lasting 30-40 secs and then a witnesssed seizure at the Ed arrival with L sided twitching.  She was given Ativan  2mg  and loaded with Keppra  60mg /Kg. CT head w/o contrast with ASPECT of 10. Noted prior strokes. CTA with multifocal multivessel stenosis with concern initially for R MCA M2 branch occlusion. However, discussed with Dr. Perley with Neuro-radiology and noted to have good distal flow and the vessel better traced on sagittal images and suspect this is likely progression of stenosis rather than a occlusion.  LKW: 1500 Modified rankin score: 3-Moderate disability-requires help but walks WITHOUT assistance IV Thrombolysis: not offered, recent strokes. EVT: not offered, poor baseline mRS. Initially reported R MCA M2 branch occlusion. However, discussed with Dr. Perley with Neuro-radiology and noted to have good distal flow and the vessel better traced on sagittal images and suspect this is likely progression of stenosis rather than a occlusion.  NIHSS components Score: Comment  1a Level of Conscious 0[]  1[]  2[x]  3[]       1b LOC Questions 0[]  1[]  2[x]       1c LOC Commands 0[]  1[x]  2[]       2 Best Gaze 0[]  1[]  2[x]       3 Visual 0[]  1[]  2[x]  3[]      4 Facial Palsy 0[x]  1[]  2[]  3[]      5a Motor Arm - left 0[]  1[]  2[]  3[]  4[x]  UN[]    5b Motor Arm - Right 0[]  1[]  2[]  3[]  4[x]  UN[]    6a Motor Leg - Left 0[]  1[]  2[]  3[]  4[x]  UN[]    6b Motor Leg - Right 0[]  1[]  2[]  3[]  4[x]  UN[]    7 Limb Ataxia 0[x]  1[]  2[]  UN[]      8 Sensory 0[]  1[]  2[x]  UN[]      9 Best Language 0[]  1[]  2[]  3[x]      10 Dysarthria 0[]  1[]  2[x]  UN[]      11 Extinct. and Inattention 0[]  1[]  2[x]       TOTAL:       ROS  Unable to ascertain due to encephalopathy.  Past History   Past Medical History:  Diagnosis Date   Aortic atherosclerosis (HCC)    Diabetes mellitus without complication (HCC)    Type 2   Diabetic neuropathy (HCC)    DVT of proximal lower limb (HCC)    Facial paralysis/Bells palsy    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HCC)    Hyperlipidemia    Mitral valve insufficiency    Osteoarthritis    SCCA (squamous cell carcinoma) of skin 03/22/2021   Bridge of nose (in situ)   Skull fracture (HCC)    age 4   Squamous cell carcinoma of skin  03/22/2021   Right forehead (in situ)   TIA (transient ischemic attack) 2020   Varicose vein of leg     Past Surgical History:  Procedure Laterality Date   ELBOW SURGERY Bilateral    20 yrs ago   ENDOVENOUS ABLATION SAPHENOUS VEIN W/ LASER Bilateral    ENDOVENOUS ABLATION SAPHENOUS VEIN W/ LASER Left 11/19/2019   endovenous laser ablation anterior accessory branch of left GSV and stab phlebectomy> 20 incisions left leg by Medford Blade MD    shoulder Bilateral    [phone]   TOENAIL EXCISION Right 2019   Big toe   TONSILLECTOMY Bilateral    TOTAL KNEE ARTHROPLASTY Right 01/01/2022   Procedure: TOTAL KNEE ARTHROPLASTY;  Surgeon: Melodi Lerner, MD;  Location: [facility];  Service: Orthopedics;  Laterality: Right;    Family History: Family History  Problem Relation Age of  Onset   Stroke Mother    Hypertension Father    Dementia Father    Leukemia Brother    Stroke Brother    Colon cancer Neg Hx    Esophageal cancer Neg Hx    Pancreatic cancer Neg Hx    Stomach cancer Neg Hx     Social History  reports that she quit smoking about 45 years ago. Her smoking use included cigarettes. She started smoking about 65 years ago. She has a 20 pack-year smoking history. She has never used smokeless tobacco. She reports current alcohol use. She reports that she does not use drugs.  Allergies  Allergen Reactions   Clindamycin/Lincomycin Other (See Comments)    Cannot tolerate mycins causes C-Diff   Erythromycin     Other Reaction(s): Not available  erythromycin   Milk-Related Compounds Diarrhea   Tetracyclines & Related Other (See Comments)    Feel really nervous    Medications   Current Facility-Administered Medications:    levETIRAcetam  (KEPPRA ) undiluted injection 4,500 mg, 4,500 mg, Intravenous, Once, Lillyonna Armstead, MD   LORazepam  (ATIVAN ) 2 MG/ML injection, , , ,    LORazepam  (ATIVAN ) injection 2 mg, 2 mg, Intravenous, Once, Osiris Odriscoll, MD   sodium chloride  flush (NS) 0.9 % injection 3 mL, 3 mL, Intravenous, Once, Kammerer, Megan L, DO  Current Outpatient Medications:    aspirin  EC 81 MG tablet, Take 1 tablet (81 mg total) by mouth daily. Swallow whole., Disp: 30 tablet, Rfl: 2   atorvastatin  (LIPITOR) 20 MG tablet, Take 1 tablet (20 mg total) by mouth daily. Hold until you are taking Paxlovid , Disp: , Rfl:    cephALEXin  (KEFLEX ) 500 MG capsule, Take 1 capsule (500 mg total) by mouth 2 (two) times daily for 5 days., Disp: 10 capsule, Rfl: 0   cephALEXin  (KEFLEX ) 500 MG capsule, Take 1 capsule (500 mg total) by mouth 2 (two) times daily., Disp: 10 capsule, Rfl: 0   Cholecalciferol  (VITAMIN D3) 50 MCG (2000 UT) capsule, Take 2,000 Units by mouth daily., Disp: , Rfl:    Clobetasol  Prop Emollient Base (CLOBETASOL  PROPIONATE E) 0.05 %  emollient cream, Apply 1 Application topically 2 (two) times daily., Disp: 60 g, Rfl: 10   clopidogrel  (PLAVIX ) 75 MG tablet, Take 1 tablet (75 mg total) by mouth daily., Disp: 30 tablet, Rfl: 1   cycloSPORINE  (RESTASIS ) 0.05 % ophthalmic emulsion, Place 1 drop into both eyes 2 (two) times daily as needed., Disp: , Rfl:    desonide  (DESOWEN ) 0.05 % cream, Apply topically 2 (two) times daily as needed (Rash)., Disp: 60 g, Rfl: 11   donepezil  (ARICEPT ) 5 MG  tablet, Take 5 mg by mouth at bedtime. (Patient taking differently: Take 10 mg by mouth at bedtime.), Disp: , Rfl:    fluconazole (DIFLUCAN) 150 MG tablet, Take 150 mg by mouth daily., Disp: , Rfl:    HYDROcodone -acetaminophen  (NORCO/VICODIN) 5-325 MG tablet, Take 1 every 6 hours for pain that is not relieved by Tylenol  alone, Disp: 20 tablet, Rfl: 0   lisinopril  (ZESTRIL ) 20 MG tablet, Take 1 tablet (20 mg total) by mouth daily. (Patient not taking: Reported on 05/31/24), Disp: 30 tablet, Rfl: 0   magnesium  oxide (MAG-OX) 400 (240 Mg) MG tablet, Take 400 mg by mouth daily., Disp: , Rfl:    metFORMIN  (GLUCOPHAGE -XR) 500 MG 24 hr tablet, Take 1,000 mg by mouth 2 (two) times daily with a meal. (Patient taking differently: Take 1,000 mg by mouth daily.), Disp: , Rfl:    Polyethyl Glycol-Propyl Glycol (SYSTANE FREE OP), Apply 1 drop to eye daily as needed (dryness/irritation)., Disp: , Rfl:    REPATHA  SURECLICK 140 MG/ML SOAJ, Inject 140 mg into the skin every 14 (fourteen) days. Sunday, Disp: , Rfl:    Semaglutide ,0.25 or 0.5MG /DOS, 2 MG/3ML SOPN, Inject 0.5 mg into the skin once a week. (Patient taking differently: Inject 1 mg into the skin once a week.), Disp: , Rfl:   Vitals   There were no vitals filed for this visit.  There is no height or weight on file to calculate BMI.   Physical Exam   General: appears frail; in no acute distress.  HENT: Normal oropharynx and mucosa. Normal external appearance of ears and nose.  Neck: Supple, no pain  or tenderness  CV: No JVD. No peripheral edema.  Pulmonary: Symmetric Chest rise. Normal respiratory effort.  Abdomen: Soft to touch, non-tender.  Ext: No cyanosis, edema, or deformity  Skin: No rash. Normal palpation of skin.   Musculoskeletal: Normal digits and nails by inspection. No clubbing.   Neurologic Examination  Mental status/Cognition: lethargic, with a lot of stimulation, oriented to self. Speech/language: limited speech output. Was able to squeeze my finger on command. Cranial nerves:   CN II Pupils equal and reactive to light, does not blink to threat on the left.   CN III,IV,VI R gaze deviation noted after the seizure. Looking straight ahead with eyes open during the seizure.   CN V Corneals intact   CN VII no asymmetry, no nasolabial fold flattening    CN VIII normal hearing to speech   CN IX & X Protecting her airway initially   CN XI Head slightly turned to left   CN XII Does not protrude tongue on command.   Motor:  Muscle bulk: poor, tone increased in LUE. LUE and LLE rhythmic twitching concerning for seizure. Moving RUE and RLE spotaneously with slight twitching in RUE. After the seizure, noted to be flaccid in LUE and LLE. Withdraws all extremities to proximal pinch.  Coordination/Complex Motor:  - Unable to assess.  Labs/Imaging/Neurodiagnostic studies   CBC:  Recent Labs  Lab 2024/05/31 2130 05/11/24 1030 05/11/24 1805  WBC 14.0* 13.5*  --   NEUTROABS 11.4*  --   --   HGB 14.4 14.8 16.0*  HCT 42.4 43.9 47.0*  MCV 91.8 90.5  --   PLT 318 345  --    Basic Metabolic Panel:  Lab Results  Component Value Date   NA 138 05/11/2024   K 3.4 (L) 05/11/2024   CO2 24 05/11/2024   GLUCOSE 233 (H) 05/11/2024   BUN 16 05/11/2024  CREATININE 1.10 (H) 05/11/2024   CALCIUM  9.9 05/11/2024   GFRNONAA >60 05/11/2024   GFRAA >60 10/30/2018   Lipid Panel:  Lab Results  Component Value Date   LDLCALC 30 04/11/2024   HgbA1c:  Lab Results  Component Value  Date   HGBA1C 8.4 (H) 03/14/2024   Urine Drug Screen: No results found for: LABOPIA, COCAINSCRNUR, LABBENZ, AMPHETMU, THCU, LABBARB  Alcohol Level     Component Value Date/Time   ETH <15 04/11/2024 1148   INR  Lab Results  Component Value Date   INR 1.0 04/11/2024   APTT  Lab Results  Component Value Date   APTT 25 04/11/2024   AED levels: No results found for: PHENYTOIN, ZONISAMIDE, LAMOTRIGINE, LEVETIRACETA  CT Head without contrast(Personally reviewed): 1. No acute intracranial hemorrhage. 2. Chronic small vessel disease. 3. Mild parenchymal volume loss. 4. Encephalomalacia in the left frontal lobe and within the right parietal occipital lobes compatible with remote infarcts. 5. ASPECT score: 10.  CT angio Head and Neck with contrast(Personally reviewed): Short-segment occlusion versus severe stenosis of proximal M2 branch of the right MCA which is progressed since 03/14/14 CTA. The occluded/stenosed portion of the vessel extends over a length of at most 1.52mm. The vessel immediately reconstitutes distal to this point.  MRI Brain(Personally reviewed): pending  Neurodiagnostics cEEG:  pending  ASSESSMENT   CHELAN HERINGER is a 78 y.o. 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 She was given Ativan  and Keppra  load and obtunded afterwards. CT Head with ASPECTs of 10, CTA initially reported R MCA M2 branch occlusion. However, discussed with Dr. Perley with Neuro-radiology and noted to have good distal flow and the vessel better traced on sagittal images and suspect this is likely progression of stenosis rather than a occlusion.  She was not a candidate for tnkase  due to recent stroke, she is not a candidate for thrombectomy due to poor baseline mRS and suspicion that the noted R M2 occlusion is just progression of stenosis given good distal reconstitution.  RECOMMENDATIONS  - MRI Brain w/o contrast - LTM EEG - Keppra  500mg  BID - Further AEDs pending LTM EEG. - patient signed out to Dr. Merrianne to follow up on LTM EEG overnight. - Eeg tech notified of LTM EEG order. - neurology will continue to follow along. ______________________________________________________________________  This patient is critically ill and at significant risk of neurological worsening, death and care requires constant monitoring of vital signs, hemodynamics,respiratory and cardiac monitoring, neurological assessment, discussion with family, other specialists and medical decision making of high complexity. I spent 75 minutes of neurocritical care time  in the care of  this patient. This was time spent independent of any time provided by nurse practitioner or PA.  Mililani Murthy [facility] 05/11/2024  7:57 PM   Signed, Tsion Inghram, MD [facility]

## 2024-05-11 NOTE — Progress Notes (Signed)
 Patient is not available at the moment for EEG lead placement, going to MRI.

## 2024-05-11 NOTE — Code Documentation (Signed)
 Stroke Response Nurse Documentation Code Documentation  MARIBETH JILES is a 78 y.o. female arriving to [facility]  via [facility] on 05/11/2024 with past medical hx of recent CVA, dementia, DM, DVT, Bell's Palsy, seen at [facility] for UTI today. On No antithrombotic that I know of. Code stroke was activated by EMS.   Patient from home where she was LKW at 1500 and now complaining of seizures, lt weakness and rt gaze.  Stroke team at the bedside on patient arrival. Labs drawn and patient cleared for CT by EDP. Patient to CT with team. NIHSS 38, see documentation for details and code stroke times. Patient with decreased LOC, disoriented, not following commands, right gaze preference , bilateral hemianopia, left facial droop, bilateral arm weakness, bilateral leg weakness, bilateral decreased sensation, Global aphasia , dysarthria , and Sensory  neglect on exam. The following imaging was completed:  CT Head and CTA. Patient is not a candidate for IV Thrombolytic due to recent stroke. Patient is not a candidate for IR due to CTA neg for LVO.   Care Plan:   No acute treatment: q2h x 12 hours NIHSS & VS, then q4h  Bedside handoff with ED RN Dozier.    Priyansh Pry Livengood  Stroke Response RN

## 2024-05-12 ENCOUNTER — Inpatient Hospital Stay (HOSPITAL_COMMUNITY): Payer: MEDICARE

## 2024-05-12 ENCOUNTER — Telehealth: Payer: MEDICARE

## 2024-05-12 ENCOUNTER — Ambulatory Visit: Payer: Self-pay | Admitting: Cardiology

## 2024-05-12 DIAGNOSIS — E876 Hypokalemia: Secondary | ICD-10-CM | POA: Diagnosis not present

## 2024-05-12 DIAGNOSIS — G40109 Localization-related (focal) (partial) symptomatic epilepsy and epileptic syndromes with simple partial seizures, not intractable, without status epilepticus: Secondary | ICD-10-CM

## 2024-05-12 DIAGNOSIS — N390 Urinary tract infection, site not specified: Secondary | ICD-10-CM

## 2024-05-12 DIAGNOSIS — D72829 Elevated white blood cell count, unspecified: Secondary | ICD-10-CM | POA: Diagnosis not present

## 2024-05-12 DIAGNOSIS — R569 Unspecified convulsions: Secondary | ICD-10-CM | POA: Diagnosis not present

## 2024-05-12 DIAGNOSIS — Z8673 Personal history of transient ischemic attack (TIA), and cerebral infarction without residual deficits: Secondary | ICD-10-CM | POA: Diagnosis not present

## 2024-05-12 LAB — MENINGITIS/ENCEPHALITIS PANEL (CSF)

## 2024-05-12 LAB — CBC
HCT: 40.3 % (ref 36.0–46.0)
Hemoglobin: 13.7 g/dL (ref 12.0–15.0)
MCH: 30.6 pg (ref 26.0–34.0)
MCHC: 34 g/dL (ref 30.0–36.0)
MCV: 90 fL (ref 80.0–100.0)
Platelets: 355 K/uL (ref 150–400)
RBC: 4.48 MIL/uL (ref 3.87–5.11)
RDW: 13.2 % (ref 11.5–15.5)
WBC: 24.4 K/uL — ABNORMAL HIGH (ref 4.0–10.5)
nRBC: 0 % (ref 0.0–0.2)

## 2024-05-12 LAB — GLUCOSE, CAPILLARY
Glucose-Capillary: 153 mg/dL — ABNORMAL HIGH (ref 70–99)
Glucose-Capillary: 154 mg/dL — ABNORMAL HIGH (ref 70–99)
Glucose-Capillary: 155 mg/dL — ABNORMAL HIGH (ref 70–99)
Glucose-Capillary: 199 mg/dL — ABNORMAL HIGH (ref 70–99)
Glucose-Capillary: 202 mg/dL — ABNORMAL HIGH (ref 70–99)
Glucose-Capillary: 214 mg/dL — ABNORMAL HIGH (ref 70–99)
Glucose-Capillary: 216 mg/dL — ABNORMAL HIGH (ref 70–99)

## 2024-05-12 LAB — CSF CELL COUNT WITH DIFFERENTIAL
RBC Count, CSF: 1 /mm3 — ABNORMAL HIGH
RBC Count, CSF: 2 /mm3 — ABNORMAL HIGH
Tube #: 1
Tube #: 4
WBC, CSF: 2 /mm3 (ref 0–5)
WBC, CSF: 2 /mm3 (ref 0–5)

## 2024-05-12 LAB — URINE CULTURE: Culture: NO GROWTH

## 2024-05-12 LAB — BASIC METABOLIC PANEL WITH GFR
Anion gap: 20 — ABNORMAL HIGH (ref 5–15)
BUN: 16 mg/dL (ref 8–23)
CO2: 19 mmol/L — ABNORMAL LOW (ref 22–32)
Calcium: 9.4 mg/dL (ref 8.9–10.3)
Chloride: 99 mmol/L (ref 98–111)
Creatinine, Ser: 1.17 mg/dL — ABNORMAL HIGH (ref 0.44–1.00)
GFR, Estimated: 47 mL/min — ABNORMAL LOW (ref >60)
Glucose, Bld: 159 mg/dL — ABNORMAL HIGH (ref 70–99)
Potassium: 3.4 mmol/L — ABNORMAL LOW (ref 3.5–5.1)
Sodium: 138 mmol/L (ref 135–145)

## 2024-05-12 LAB — PHOSPHORUS: Phosphorus: 3.3 mg/dL (ref 2.5–4.6)

## 2024-05-12 LAB — LACTIC ACID, PLASMA: Lactic Acid, Venous: 2.1 mmol/L (ref 0.5–1.9)

## 2024-05-12 LAB — MAGNESIUM: Magnesium: 1.5 mg/dL — ABNORMAL LOW (ref 1.7–2.4)

## 2024-05-12 LAB — PROTEIN AND GLUCOSE, CSF
Glucose, CSF: 108 mg/dL — ABNORMAL HIGH (ref 40–70)
Total  Protein, CSF: 39 mg/dL (ref 15–45)

## 2024-05-12 LAB — TRIGLYCERIDES: Triglycerides: 139 mg/dL (ref <150)

## 2024-05-12 MED ORDER — MAGNESIUM SULFATE 4 GM/100ML IV SOLN
4.0000 g | Freq: Once | INTRAVENOUS | Status: AC
  Administered 2024-05-12: 4 g via INTRAVENOUS
  Filled 2024-05-12: qty 100

## 2024-05-12 MED ORDER — LEVETIRACETAM (KEPPRA) 500 MG/5 ML ADULT IV PUSH
500.0000 mg | Freq: Two times a day (BID) | INTRAVENOUS | Status: DC
  Administered 2024-05-12 – 2024-05-17 (×12): 500 mg via INTRAVENOUS
  Filled 2024-05-12 (×12): qty 5

## 2024-05-12 MED ORDER — POTASSIUM CHLORIDE 20 MEQ PO PACK
40.0000 meq | PACK | Freq: Once | ORAL | Status: AC
  Administered 2024-05-12: 40 meq
  Filled 2024-05-12: qty 2

## 2024-05-12 MED ORDER — LIDOCAINE HCL (PF) 1 % IJ SOLN
INTRAMUSCULAR | Status: AC
  Filled 2024-05-12: qty 30

## 2024-05-12 MED ORDER — DEXTROSE IN LACTATED RINGERS 5 % IV SOLN: INTRAVENOUS | Status: AC

## 2024-05-12 NOTE — Progress Notes (Signed)
 Subjective: Weaned off propofol  this morning.  No further seizures.  Starting to follow some commands but still has some left-sided  ROS: Unable to obtain due to poor mental status  Examination  Vital signs in last 24 hours: Temp:  [97.5 F (36.4 C)-98.6 F (37 C)] 98.4 F (36.9 C) (09/09 0815) Pulse Rate:  [76-128] 105 (09/09 0700) Resp:  [12-21] 14 (09/09 0700) BP: (107-198)/(69-99) 128/82 (09/09 0700) SpO2:  [94 %-100 %] 100 % (09/09 0747) FiO2 (%):  [40 %-100 %] 40 % (09/09 0747) Weight:  [69.5 kg-76 kg] 71.6 kg (09/09 0500)  General: lying in bed, NAD Neuro: Patient was undergoing lumbar puncture therefore exam was limited.  Did appear awake.  Per RN patient was following some commands but appeared to be weaker on left side  Basic Metabolic Panel: Recent Labs  Lab 05/05/24 2130 05/11/24 1030 05/11/24 1800 05/11/24 1805 05/11/24 2211 05/12/24 0554  NA 139 139 140 138 137 138  K 3.7 3.4* 3.5 3.4* 3.2* 3.4*  CL 102 97* 97* 102  --  99  CO2 20* 24 10*  --   --  19*  GLUCOSE 122* 113* 248* 233*  --  159*  BUN 16 13 13 16   --  16  CREATININE 1.51* 0.92 1.50* 1.10*  --  1.17*  CALCIUM  9.7 9.9 9.5  --   --  9.4  MG  --   --   --   --   --  1.5*  PHOS  --   --   --   --   --  3.3    CBC: Recent Labs  Lab 05/05/24 2130 05/11/24 1030 05/11/24 1800 05/11/24 1805 05/11/24 2211 05/12/24 0554  WBC 14.0* 13.5* 23.5*  --   --  24.4*  NEUTROABS 11.4*  --  17.8*  --   --   --   HGB 14.4 14.8 14.2 16.0* 13.3 13.7  HCT 42.4 43.9 45.7 47.0* 39.0 40.3  MCV 91.8 90.5 98.7  --   --  90.0  PLT 318 345 389  --   --  355     Coagulation Studies: Recent Labs    05/11/24 1800  LABPROT 16.1*  INR 1.2    Imaging personally reviewed  MRI brain without contrast 05/11/2024:. Abnormal diffusion signal abnormality involving the mesial right temporal lobe/right hippocampal formation. Finding is nonspecific,but favored to reflect changes related to seizure/postictal changes.  Sequelae of acute ischemia or encephalitis, including herpes encephalitis, would be the primary differential considerations. No other acute intracranial abnormality. Underlying age-related cerebral atrophy with chronic small vessel ischemic disease, with multiple scattered chronic ischemic infarcts as above.    ASSESSMENT AND PLAN: 78 year old female with recent left frontal infarct, prior multiple strokes, recent UTI on Keflex  who presented with status epilepticus which has since resolved  Focal convulsive status epilepticus, resolved - Etiology: Likely in the setting of UTI in a patient with underlying strokes - However due to leukocytosis, cannot rule out meningitis/encephalitis.  Therefore recommend lumbar puncture - Continue Keppra  500 mg twice daily - Wean propofol  to stop - As needed IV Versed  for clinical seizures - Continue LTM EEG today.  Will likely DC tomorrow if no seizures - Discussed with ICU team  CRITICAL CARE Performed by: Arlin [name]   Total critical care time: 35 minutes  Critical care time was exclusive of separately billable procedures and treating other patients.  Critical care was necessary to treat or prevent imminent or life-threatening deterioration.  Critical  care was time spent personally by me on the following activities: development of treatment plan with patient and/or surrogate as well as nursing, discussions with consultants, evaluation of patient's response to treatment, examination of patient, obtaining history from patient or surrogate, ordering and performing treatments and interventions, ordering and review of laboratory studies, ordering and review of radiographic studies, pulse oximetry and re-evaluation of patient's condition.    [name] Epilepsy [facility] For questions after 5pm please refer to AMION to reach the Neurologist on call

## 2024-05-12 NOTE — Progress Notes (Signed)
 Pharmacy Electrolyte Replacement  Recent Labs:  Recent Labs    05/12/24 0554  K 3.4*  MG 1.5*  PHOS 3.3  CREATININE 1.17*    Low Critical Values (K </= 2.5, Phos </= 1, Mg </= 1) Present: None  Plan: Give KCL 40 meq per tube and magnesium  sulfate 4 g IV.   Weslie Rasmus, PharmD

## 2024-05-12 NOTE — Plan of Care (Signed)
  Problem: Education: Goal: Ability to describe self-care measures that may prevent or decrease complications (Diabetes Survival Skills Education) will improve Outcome: Progressing Goal: Individualized Educational Video(s) Outcome: Progressing   Problem: Coping: Goal: Ability to adjust to condition or change in health will improve Outcome: Progressing   Problem: Fluid Volume: Goal: Ability to maintain a balanced intake and output will improve Outcome: Progressing   Problem: Health Behavior/Discharge Planning: Goal: Ability to identify and utilize available resources and services will improve Outcome: Progressing Goal: Ability to manage health-related needs will improve Outcome: Progressing   Problem: Metabolic: Goal: Ability to maintain appropriate glucose levels will improve Outcome: Not Progressing   Problem: Nutritional: Goal: Maintenance of adequate nutrition will improve Outcome: Progressing Goal: Progress toward achieving an optimal weight will improve Outcome: Progressing   Problem: Skin Integrity: Goal: Risk for impaired skin integrity will decrease Outcome: Progressing   Problem: Tissue Perfusion: Goal: Adequacy of tissue perfusion will improve Outcome: Not Progressing   Problem: Education: Goal: Knowledge of General Education information will improve Description: Including pain rating scale, medication(s)/side effects and non-pharmacologic comfort measures Outcome: Progressing   Problem: Health Behavior/Discharge Planning: Goal: Ability to manage health-related needs will improve Outcome: Progressing   Problem: Clinical Measurements: Goal: Ability to maintain clinical measurements within normal limits will improve Outcome: Progressing Goal: Will remain free from infection Outcome: Not Progressing Goal: Diagnostic test results will improve Outcome: Progressing Goal: Respiratory complications will improve Outcome: Not Progressing Goal: Cardiovascular  complication will be avoided Outcome: Not Progressing   Problem: Activity: Goal: Risk for activity intolerance will decrease Outcome: Not Progressing   Problem: Nutrition: Goal: Adequate nutrition will be maintained Outcome: Not Progressing   Problem: Coping: Goal: Level of anxiety will decrease Outcome: Progressing   Problem: Elimination: Goal: Will not experience complications related to bowel motility Outcome: Progressing Goal: Will not experience complications related to urinary retention Outcome: Progressing   Problem: Pain Managment: Goal: General experience of comfort will improve and/or be controlled Outcome: Progressing   Problem: Safety: Goal: Ability to remain free from injury will improve Outcome: Not Progressing   Problem: Skin Integrity: Goal: Risk for impaired skin integrity will decrease Outcome: Progressing   Problem: Education: Goal: Knowledge of disease or condition will improve Outcome: Progressing Goal: Knowledge of secondary prevention will improve (MUST DOCUMENT ALL) Outcome: Progressing Goal: Knowledge of patient specific risk factors will improve (DELETE if not current risk factor) Outcome: Progressing   Problem: Ischemic Stroke/TIA Tissue Perfusion: Goal: Complications of ischemic stroke/TIA will be minimized Outcome: Progressing   Problem: Coping: Goal: Will verbalize positive feelings about self Outcome: Not Progressing Goal: Will identify appropriate support needs Outcome: Not Progressing   Problem: Health Behavior/Discharge Planning: Goal: Ability to manage health-related needs will improve Outcome: Progressing Goal: Goals will be collaboratively established with patient/family Outcome: Progressing   Problem: Self-Care: Goal: Ability to participate in self-care as condition permits will improve Outcome: Not Progressing Goal: Verbalization of feelings and concerns over difficulty with self-care will improve Outcome: Not  Progressing Goal: Ability to communicate needs accurately will improve Outcome: Not Progressing  Patient total care follows commands unable to wean from vent today not enough effort. Patient now moving all ext to commands

## 2024-05-12 NOTE — Progress Notes (Addendum)
 0800 patient open eyes to her name and tracks able to follow commands with right arm only. Patient nods yes to feeling in left and and bilateral legs. However she doesn't respond to pain in those extremities. IV team to place additional PIV sedation decreased for vent weaning Rt aware. 0930 patient had 1 episode of vomit while she ws moving ETT in mouth with her tongue after giving her miralax  OG tube placed to LIS notified CC team. 1110 am consent for LP from [name] patients wife via phone 1130 am LP completed pt tolerated well 1530 patient now able to move left side to commands left arm to gravity BLE just wiggles toes at this time 1640 Spouse karen at bedside updated on Plan of care and events of the day.

## 2024-05-12 NOTE — TOC CM/SW Note (Signed)
 Transition of Care [facility]) - Inpatient Brief Assessment   Patient Details  Name: BLANCA CARREON MRN: [id number] Date of Birth: [birth year]  Transition of Care [facility]) CM/SW Contact:    Darrien Laakso M, RN Phone Number: 05/12/2024, 4:46 PM   Clinical Narrative: 78 year old woman who presented to [facility] ED 9/8 via EMS as a Code Stroke, concern for seizure-like activity. PMHx significant for HLD, CVA (recent L frontal infarct), TIAs, Bell's palsy, DVT, T2DM c/b neuropathy, SCC (skin). Presented to [facility] earlier on day of admission for low back pain and UTI.  Patient resides at home with spouse who provides assistance with ADLS.  She is active with [facility] for HHPT/OT; will need resumption of care orders prior to discharge.    Transition of Care Asessment: Insurance and Status: Insurance coverage has been reviewed Patient has primary care physician: Yes (Dr. Ryan Hives) Home environment has been reviewed: Lives with spouse; friends and family assist as needed Prior level of function:: needs assistance with ADLS (wife helps) Prior/Current Home Services: Current home services [facility] for HHPT/OT) Social Drivers of Health Review: SDOH reviewed no interventions necessary Readmission risk has been reviewed: Yes Transition of care needs: transition of care needs identified, TOC will continue to follow   Mliss MICAEL Fass, RN, BSN  Trauma/Neuro ICU Case Manager [phone]

## 2024-05-12 NOTE — Progress Notes (Signed)
   05/12/24 1039  Daily Weaning Assessment  Daily Assessment of Readiness to Wean Wean protocol criteria met (SBT performed)  SBT Method CPAP 5 cm H20 and PS 5 cm H20 (10/5)  Weaning Start Time 1039  Patient response Passed (Tolerated well)

## 2024-05-12 NOTE — Progress Notes (Signed)
 NAME:  Beth Macdonald, MRN:  [id number], DOB:  [birth year], LOS: 1 ADMISSION DATE:  05/11/2024 CONSULTATION DATE:  05/11/2024 REFERRING MD:  Gennaro - EDP, CHIEF COMPLAINT: Code Stroke, seizures   History of Present Illness:  78 year old woman who presented to [facility] ED 9/8 via EMS as a Code Stroke, concern for seizure-like activity. PMHx significant for HLD, CVA (recent L frontal infarct), TIAs, Bell's palsy, DVT, T2DM c/b neuropathy, SCC (skin). Presented to [facility] earlier on day of admission for low back pain and UTI, prescribed additional-day course of Keflex .   Patient is intubated, therefore history is obtained primarily from chart review and family (wife, [name]) at bedside. Patient's wife states that she had recently been feeling poorly with concern for UTI, states kidneys were a concern due to associated back pain. Presented to [facility] earlier in the day 9/8; wife states she noticed rhythmic L shoulder movement c/f seizure. No history of seizures. This worsened at home and speech became slurred. Patient was then BIB EMS from home with seizure-like activity, weakness and R gaze preference. LKW 1500. NIHSS 38 with symptoms including decreased LOC with disorientation/inability to follow commands, R gaze preference, L facial droop, bilateral UE/LE weakness, global aphasia/dysarthria. Labs were notable for WBC 23.5, Hgb/Plt WNL. INR 1.2. Na 140, K 3.5, CO2 10, BUN/Cr 13/1.50 (baseline 1.3-1.5), LFTs WNL. Ethanol WNL. UA earlier in the day with trace ketones and protein. CT Head was negative for ICH, +chronic small vessel disease with parenchymal volume loss and encephalomalacia of the L frontal lobe/R parietal lobe 2/2 remote infarcts. 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 Neurology was consulted.   Patient was administered Ativan  2mg  and Keppra  load initiated.  Patient mental status declined and she was no longer able to protect her airway; she was subsequently intubated in ED.  PCCM consulted for admission.  Pertinent Medical History:   Past Medical History:  Diagnosis Date   Aortic atherosclerosis (HCC)    Diabetes mellitus without complication (HCC)    Type 2   Diabetic neuropathy (HCC)    DVT of proximal lower limb (HCC)    Facial paralysis/Bells palsy    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HCC)    Hyperlipidemia    Mitral valve insufficiency    Osteoarthritis    SCCA (squamous cell carcinoma) of skin 03/22/2021   Bridge of nose (in situ)   Skull fracture (HCC)    age 38   Squamous cell carcinoma of skin 03/22/2021   Right forehead (in situ)   TIA (transient ischemic attack) 2020   Varicose vein of leg    Significant Hospital Events: Including procedures, antibiotic start and stop dates in addition to other pertinent events   9/8 - Presented to [facility] ED for Code Stroke, seizure-like activity. Previously seen at [facility] ED for UTI, prescribed additional Keflex  x 5-day course. CT Head/CTA Head/Neck as above. Received Ativan /Keppra  load. Required intubation for airway protection. PCCM consulted. 9/9 No seizure activity noted on cEEG.  Interim History / Subjective:   She is waking up and tracking this morning with sedation wean. No movement of lower extremities and left arm per nursing IV team at bedside  Objective:  Blood pressure 128/82, pulse (!) 105, temperature 98.6 F (37 C), temperature source Oral, resp. rate 14, height 5' 8 (1.727 m), weight 71.6 kg, SpO2 99%.    Vent Mode: PRVC FiO2 (%):  [40 %-  100 %] 40 % Set Rate:  [16 bmp] 16 bmp Vt Set:  [510 mL] 510 mL PEEP:  [5 cmH20] 5 cmH20 Plateau Pressure:  [8 cmH20-13 cmH20] 8 cmH20   Intake/Output Summary (Last 24 hours) at 05/12/2024 0758 Last data filed at 05/12/2024 0700 Gross per 24 hour  Intake 625.65 ml  Output 350 ml  Net 275.65 ml   Filed Weights   05/11/24 1854  05/11/24 2200 05/12/24 0500  Weight: 76 kg 69.5 kg 71.6 kg   Physical Examination: General: Acutely ill-appearing elderly woman in NAD. HEENT: [place]/AT, anicteric sclera, PERRL 5mm, moist mucous membranes Neuro: Intubated, lightly sedated. Withdraws to pain in right upper extremities. Does not withdraw to pain in lower extremities and left upper extremity. Not following commands. No spontaneous movement of extremities on my exam. +Corneal, +Cough, and +Gag  CV: tachycardic, no m/g/r. PULM: clear to auscultation GI: Soft, nontender, nondistended. Normoactive bowel sounds. Extremities: No LE edema noted. Skin: Warm/dry, no rashes.  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 Two clusters of seizure-like activity noted (home, ED). CT Head negative for ICH, +chronic small vessel disease with parenchymal volume loss and encephalomalacia of the L frontal lobe/R parietal lobe 2/2 remote infarcts. 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 - Neuro following, appreciate recommendations - MRI brain reveals acute findings most consistent with seizure-related edema in the right hippocampal formation. The overall distribution of the DWI signal changes, restricted to the hippocampal formation without accompanying hemorrhage, does not appear consistent with herpes encephalitis.  - cEEG, no seizures noted - AEDs per Neuro (Keppra , Versed  PRN) - Seizure precautions - Frequent neuro checks - Neuroprotective measures: HOB > 30 degrees, normoglycemia, normothermia, electrolytes WNL - PT/OT/SLP when able to participate in care  Acute respiratory insufficiency  in the setting of obtundation requiring mechanical ventilation - Continue full vent support (4-8cc/kg IBW) - Wean FiO2 for O2 sat > 90% - WUA/SBT today AM as no seizures noted on cEEG - VAP bundle - Pulmonary hygiene - PAD protocol for sedation: Propofol  and Fentanyl  for goal RASS 0 to -1; low threshold to change Prop to alternative agent given history of elevated TG, will trend  HLD Mitral valve insufficiency, mild Recent lipid panel 8/9 with total cholesterol 120, TG 164, HDL/LDL/VLDL WNL. - Consider repeat Echo - Resume ASA/Plavix /statin as clinically appropriate - Hold home Repatha  for now - Cardiac monitoring  T2DM Diabetic peripheral neuropathy - SSI - CBGs Q4H - Goal CBG 140-180 - Hold home metformin , semaglutide   Recent UTI Prescribed Keflex  x additional 5-day course for persistent symptoms (initial course 9/3-9/8). ?Cephalexin  lowering seizure threshold, but not an agent strongly associated with this in the absence of high doses/significant renal impairment. - UA from [facility] ED 9/8AM unimpressive - Hold cephalexin  for now, consider alternative agent - Trend WBC, fever curve - F/u Cx data  Dementia, baseline - Resume home Aricept   Hypokalemia Hypomagnesemia - replete as needed  Ketonuria - likely starvation ketosis, start D5LR 62mL/hr  Labs:  CBC: Recent Labs  Lab 05/05/24 2130 05/11/24 1030 05/11/24 1800 05/11/24 1805 05/11/24 2211 05/12/24 0554  WBC 14.0* 13.5* 23.5*  --   --  24.4*  NEUTROABS 11.4*  --  17.8*  --   --   --   HGB 14.4 14.8 14.2 16.0* 13.3 13.7  HCT 42.4 43.9 45.7 47.0* 39.0 40.3  MCV 91.8 90.5 98.7  --   --  90.0  PLT 318 345 389  --   --  355   Basic Metabolic Panel: Recent Labs  Lab 05/05/24 2130 05/11/24 1030 05/11/24 1800 05/11/24 1805 05/11/24 2211 05/12/24 0554  NA 139 139 140 138 137 138  K 3.7 3.4* 3.5 3.4* 3.2* 3.4*  CL 102 97* 97* 102  --  99  CO2 20* 24 10*  --   --  19*  GLUCOSE 122* 113* 248* 233*  --  159*   BUN 16 13 13 16   --  16  CREATININE 1.51* 0.92 1.50* 1.10*  --  1.17*  CALCIUM  9.7 9.9 9.5  --   --  9.4  MG  --   --   --   --   --  1.5*  PHOS  --   --   --   --   --  3.3   GFR: Estimated Creatinine Clearance: 38.7 mL/min (A) (by C-G formula based on SCr of 1.17 mg/dL (H)). Recent Labs  Lab 05/05/24 2130 05/11/24 1030 05/11/24 1800 05/11/24 2234 05/12/24 0554  PROCALCITON  --   --   --  2.30  --   WBC 14.0* 13.5* 23.5*  --  24.4*  LATICACIDVEN  --   --   --  2.1* 2.1*   Liver Function Tests: Recent Labs  Lab 05/11/24 1030 05/11/24 1800  AST 20 37  ALT 13 17  ALKPHOS 58 52  BILITOT 0.7 0.8  PROT 7.6 7.4  ALBUMIN  4.1 3.3*   No results for input(s): LIPASE, AMYLASE in the last 168 hours. No results for input(s): AMMONIA in the last 168 hours.  ABG:    Component Value Date/Time   PHART 7.349 (L) 05/11/2024 2211   PCO2ART 34.4 05/11/2024 2211   PO2ART 574 (H) 05/11/2024 2211   HCO3 19.0 (L) 05/11/2024 2211   TCO2 20 (L) 05/11/2024 2211   ACIDBASEDEF 6.0 (H) 05/11/2024 2211   O2SAT 100 05/11/2024 2211    Coagulation Profile: Recent Labs  Lab 05/11/24 1800  INR 1.2   Cardiac Enzymes: No results for input(s): CKTOTAL, CKMB, CKMBINDEX, TROPONINI in the last 168 hours.  HbA1C: Hgb A1c MFr Bld  Date/Time Value Ref Range Status  03/14/2024 09:36 PM 8.4 (H) 4.8 - 5.6 % Final    Comment:    (NOTE)         Prediabetes: 5.7 - 6.4         Diabetes: >6.4         Glycemic control for adults with diabetes: <7.0   11/15/2022 04:36 AM 6.7 (H) 4.8 - 5.6 % Final    Comment:    (NOTE)         Prediabetes: 5.7 - 6.4         Diabetes: >6.4         Glycemic control for adults with diabetes: <7.0    CBG: Recent Labs  Lab 05/11/24 1759 05/11/24 2102 05/12/24 0026 05/12/24 0337 05/12/24 0751  GLUCAP 237* 166* 155* 153* 154*    Critical care time:    The patient is critically ill with multiple organ system failure and requires high complexity  decision making for assessment and support, frequent evaluation and titration of therapies, advanced monitoring, review of radiographic studies and interpretation of complex  data.   Critical Care Time devoted to patient care services, exclusive of separately billable procedures, described in this note is 35 minutes.  Dorn Chill, MD [facility] Office: [phone]   See Amion for personal pager PCCM on call pager [phone] until 7pm. Please call Elink 7p-7a. [phone]

## 2024-05-12 NOTE — Progress Notes (Signed)
 LTM EEG hooked up and running - no initial skin breakdown - push button tested - [facility] monitoring.

## 2024-05-12 NOTE — Progress Notes (Signed)
 S: Sedated on propofol  at a rate of 20 and fentanyl  at a rate of 50  O: BP 114/82   Pulse 92   Temp 98.5 F (36.9 C) (Axillary)   Resp 16   Ht 5' 8 (1.727 m)   Wt 76 kg   SpO2 97%   BMI 25.48 kg/m   Exam reveals a heavily sedated elderly female with symmetric pupils. No blink to threat. No clinical seizure activity seen.   EEG preliminary review: Diffuse slowing. No electrographic seizure activity seen.   MRI brain (images personally reviewed):   1. Abnormal diffusion signal abnormality involving the mesial right temporal lobe/right hippocampal formation. Finding is nonspecific, but favored to reflect changes related to seizure/postictal changes. Sequelae of acute ischemia or encephalitis, including herpes encephalitis, would be the primary differential considerations. 2. No other acute intracranial abnormality. 3. Underlying age-related cerebral atrophy with chronic small vessel ischemic disease, with multiple scattered chronic ischemic infarcts.  A/R:  78 y.o. 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 She was given Ativan  and Keppra  load and obtunded afterwards.  - On follow up exam she is intubated and sedated on propofol  and fentanyl  with no clinical seizure activity seen.  - EEG preliminary review with no electrographic seizure activity seen.  - MRI brain reveals acute findings most consistent with seizure-related edema in the right hippocampal formation. The overall distribution of the DWI signal changes, restricted to the hippocampal formation without accompanying hemorrhage, does not appear consistent with herpes encephalitis.  - Continue Keppra  at 500 mg BID - Continue LTM EEG and attempt to wean off sedation in the AM.   15 minutes of critical care time.   Electronically signed:  Dr. Akhila Mahnken

## 2024-05-12 NOTE — Procedures (Signed)
 Patient Name: Beth Macdonald  MRN: [id number]  Epilepsy Attending: Arlin [name]  Referring Physician/Provider: Khaliqdina, Salman, MD  Duration: 05/12/2024 0206 to 05/13/2024 0206  Patient history: 78 y.o. 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  EEG to evaluate for seizure  Level of alertness: comatose/ lethargic   AEDs during EEG study: Propofol , LEV  Technical aspects: This EEG study was done with scalp electrodes positioned according to the 10-20 International system of electrode placement. Electrical activity was reviewed with band pass filter of 1-70Hz , sensitivity of 7 uV/mm, display speed of 24mm/sec with a 60Hz  notched filter applied as appropriate. EEG data were recorded continuously and digitally stored.  Video monitoring was available and reviewed as appropriate.  Description: EEG showed continuous generalized and lateralized right hemisphere 3 to 6 Hz theta-delta slowing with overriding 13 to 15 Hz beta activity. Lateralized periodic discharges were noted in right hemisphere at 0.5 to 1.5 Hz. Hyperventilation and photic stimulation were not performed.     ABNORMALITY - Lateralized periodic discharges ( LPD ) left  hemisphere - Continuous slow, generalized and lateralized right hemisphere  IMPRESSION: This study showed evidence of epileptogenicity and cortical dysfunction arising from right hemisphere, likely secondary to underlying structural abnormality.  Additionally there is a diffuse encephalopathy, likely related to sedation.  No seizures were noted during the study.  Yeraldine Forney O Hutch Rhett

## 2024-05-12 NOTE — Procedures (Signed)
 Lumbar Puncture Procedure Note  Beth Macdonald  [id number]  Jul 07, 1946  Date:05/12/24  Time:11:29 AM   Provider Performing:Eleanora Guinyard   Procedure: Lumbar Puncture (37729)  Indication(s) Rule out meningitis  Consent Risks of the procedure as well as the alternatives and risks of each were explained to the patient and/or caregiver.  Consent for the procedure was obtained and is signed in the bedside chart  Anesthesia Topical only with 1% lidocaine     Time Out Verified patient identification, verified procedure, site/side was marked, verified correct patient position, special equipment/implants available, medications/allergies/relevant history reviewed, required imaging and test results available.   Sterile Technique Maximal sterile technique including sterile barrier drape, hand hygiene, sterile gown, sterile gloves, mask, hair covering.    Procedure Description Using palpation, approximate location of L3-L4 space identified.   Lidocaine  used to anesthetize skin and subcutaneous tissue overlying this area.  A 20g spinal needle was then used to access the subarachnoid space. Opening pressure:18 cm H2O. Closing pressure:Not obtained. Clear CSF obtained.  Complications/Tolerance None; patient tolerated the procedure well.   EBL Minimal   Specimen(s) CSF

## 2024-05-12 NOTE — Progress Notes (Signed)
 eLink Physician-Brief Progress Note Patient Name: Beth Macdonald DOB: 16-Jul-1946 MRN: [id number]   Date of Service  05/12/2024  HPI/Events of Note  No urine output since admission > 400 cc on bladder scan Patient seen intubated due to seizures/airway protection  eICU Interventions  Foley cath insertion ordered for accurate input and output monitoring in this critically ill patient Bedside rounding team to reassess when Foley catheter can be appropriately removed     Intervention Category Intermediate Interventions: Other:  Damien ONEIDA Grout 05/12/2024, 4:44 AM

## 2024-05-13 ENCOUNTER — Inpatient Hospital Stay (HOSPITAL_COMMUNITY): Payer: MEDICARE

## 2024-05-13 DIAGNOSIS — G9340 Encephalopathy, unspecified: Secondary | ICD-10-CM | POA: Diagnosis not present

## 2024-05-13 DIAGNOSIS — G40109 Localization-related (focal) (partial) symptomatic epilepsy and epileptic syndromes with simple partial seizures, not intractable, without status epilepticus: Secondary | ICD-10-CM | POA: Diagnosis not present

## 2024-05-13 DIAGNOSIS — R569 Unspecified convulsions: Secondary | ICD-10-CM | POA: Diagnosis not present

## 2024-05-13 DIAGNOSIS — G319 Degenerative disease of nervous system, unspecified: Secondary | ICD-10-CM | POA: Diagnosis not present

## 2024-05-13 DIAGNOSIS — Z8673 Personal history of transient ischemic attack (TIA), and cerebral infarction without residual deficits: Secondary | ICD-10-CM | POA: Diagnosis not present

## 2024-05-13 DIAGNOSIS — E876 Hypokalemia: Secondary | ICD-10-CM | POA: Diagnosis not present

## 2024-05-13 DIAGNOSIS — N390 Urinary tract infection, site not specified: Secondary | ICD-10-CM | POA: Diagnosis not present

## 2024-05-13 DIAGNOSIS — D72829 Elevated white blood cell count, unspecified: Secondary | ICD-10-CM | POA: Diagnosis not present

## 2024-05-13 DIAGNOSIS — I63522 Cerebral infarction due to unspecified occlusion or stenosis of left anterior cerebral artery: Secondary | ICD-10-CM | POA: Diagnosis not present

## 2024-05-13 DIAGNOSIS — G40909 Epilepsy, unspecified, not intractable, without status epilepticus: Secondary | ICD-10-CM | POA: Diagnosis not present

## 2024-05-13 LAB — CBC WITH DIFFERENTIAL/PLATELET
Abs Immature Granulocytes: 0.16 K/uL — ABNORMAL HIGH (ref 0.00–0.07)
Basophils Absolute: 0 K/uL (ref 0.0–0.1)
Basophils Relative: 0 %
Eosinophils Absolute: 0 K/uL (ref 0.0–0.5)
Eosinophils Relative: 0 %
HCT: 41.2 % (ref 36.0–46.0)
Hemoglobin: 13.9 g/dL (ref 12.0–15.0)
Immature Granulocytes: 1 %
Lymphocytes Relative: 4 %
Lymphs Abs: 0.8 K/uL (ref 0.7–4.0)
MCH: 30.3 pg (ref 26.0–34.0)
MCHC: 33.7 g/dL (ref 30.0–36.0)
MCV: 89.8 fL (ref 80.0–100.0)
Monocytes Absolute: 1.7 K/uL — ABNORMAL HIGH (ref 0.1–1.0)
Monocytes Relative: 8 %
Neutro Abs: 17.2 K/uL — ABNORMAL HIGH (ref 1.7–7.7)
Neutrophils Relative %: 87 %
Platelets: 379 K/uL (ref 150–400)
RBC: 4.59 MIL/uL (ref 3.87–5.11)
RDW: 13.5 % (ref 11.5–15.5)
WBC: 19.9 K/uL — ABNORMAL HIGH (ref 4.0–10.5)
nRBC: 0 % (ref 0.0–0.2)

## 2024-05-13 LAB — BASIC METABOLIC PANEL WITH GFR
Anion gap: 16 — ABNORMAL HIGH (ref 5–15)
BUN: 23 mg/dL (ref 8–23)
CO2: 21 mmol/L — ABNORMAL LOW (ref 22–32)
Calcium: 9.4 mg/dL (ref 8.9–10.3)
Chloride: 105 mmol/L (ref 98–111)
Creatinine, Ser: 1.33 mg/dL — ABNORMAL HIGH (ref 0.44–1.00)
GFR, Estimated: 40 mL/min — ABNORMAL LOW (ref >60)
Glucose, Bld: 211 mg/dL — ABNORMAL HIGH (ref 70–99)
Potassium: 3.3 mmol/L — ABNORMAL LOW (ref 3.5–5.1)
Sodium: 142 mmol/L (ref 135–145)

## 2024-05-13 LAB — GLUCOSE, CAPILLARY
Glucose-Capillary: 236 mg/dL — ABNORMAL HIGH (ref 70–99)
Glucose-Capillary: 192 mg/dL — ABNORMAL HIGH (ref 70–99)
Glucose-Capillary: 188 mg/dL — ABNORMAL HIGH (ref 70–99)
Glucose-Capillary: 211 mg/dL — ABNORMAL HIGH (ref 70–99)
Glucose-Capillary: 251 mg/dL — ABNORMAL HIGH (ref 70–99)

## 2024-05-13 LAB — PHOSPHORUS: Phosphorus: 2.2 mg/dL — ABNORMAL LOW (ref 2.5–4.6)

## 2024-05-13 LAB — MAGNESIUM: Magnesium: 2.4 mg/dL (ref 1.7–2.4)

## 2024-05-13 MED ORDER — ADULT MULTIVITAMIN W/MINERALS CH
1.0000 | ORAL_TABLET | Freq: Every day | ORAL | Status: DC
  Administered 2024-05-13 – 2024-05-17 (×5): 1
  Filled 2024-05-13 (×5): qty 1

## 2024-05-13 MED ORDER — INSULIN ASPART 100 UNIT/ML IJ SOLN
0.0000 [IU] | INTRAMUSCULAR | Status: DC
  Administered 2024-05-13: 3 [IU] via SUBCUTANEOUS
  Administered 2024-05-13: 5 [IU] via SUBCUTANEOUS
  Administered 2024-05-14: 3 [IU] via SUBCUTANEOUS
  Administered 2024-05-14 (×2): 5 [IU] via SUBCUTANEOUS
  Administered 2024-05-14: 8 [IU] via SUBCUTANEOUS
  Administered 2024-05-14: 3 [IU] via SUBCUTANEOUS
  Administered 2024-05-15 (×2): 8 [IU] via SUBCUTANEOUS
  Administered 2024-05-15: 11 [IU] via SUBCUTANEOUS

## 2024-05-13 MED ORDER — VITAL HP 1.0 CAL PO LIQD
1000.0000 mL | ORAL | Status: DC
Start: 2024-05-13 — End: 2024-05-13

## 2024-05-13 MED ORDER — GADOBUTROL 1 MMOL/ML IV SOLN
7.0000 mL | Freq: Once | INTRAVENOUS | Status: AC | PRN
  Administered 2024-05-13: 7 mL via INTRAVENOUS

## 2024-05-13 MED ORDER — HEPARIN SODIUM (PORCINE) 5000 UNIT/ML IJ SOLN
5000.0000 [IU] | Freq: Three times a day (TID) | INTRAMUSCULAR | Status: DC
  Administered 2024-05-13 – 2024-05-19 (×18): 5000 [IU] via SUBCUTANEOUS
  Filled 2024-05-13 (×18): qty 1

## 2024-05-13 MED ORDER — CLOPIDOGREL BISULFATE 75 MG PO TABS
75.0000 mg | ORAL_TABLET | Freq: Every day | ORAL | Status: DC
  Administered 2024-05-13 – 2024-05-17 (×5): 75 mg
  Filled 2024-05-13 (×5): qty 1

## 2024-05-13 MED ORDER — ASPIRIN 81 MG PO CHEW
81.0000 mg | CHEWABLE_TABLET | Freq: Every day | ORAL | Status: DC
  Administered 2024-05-13 – 2024-05-17 (×5): 81 mg
  Filled 2024-05-13 (×5): qty 1

## 2024-05-13 MED ORDER — POTASSIUM & SODIUM PHOSPHATES 280-160-250 MG PO PACK
2.0000 | PACK | ORAL | Status: AC
  Administered 2024-05-13 (×3): 2
  Filled 2024-05-13 (×3): qty 2

## 2024-05-13 MED ORDER — THIAMINE MONONITRATE 100 MG PO TABS
100.0000 mg | ORAL_TABLET | Freq: Every day | ORAL | Status: DC
Start: 2024-05-13 — End: 2024-05-18
  Administered 2024-05-13 – 2024-05-17 (×5): 100 mg
  Filled 2024-05-13 (×5): qty 1

## 2024-05-13 MED ORDER — KATE FARMS STANDARD 1.4 EN LIQD
1000.0000 mL | ENTERAL | Status: DC
  Administered 2024-05-13 – 2024-05-17 (×4): 1000 mL
  Filled 2024-05-13 (×6): qty 1000

## 2024-05-13 MED ORDER — ATORVASTATIN CALCIUM 10 MG PO TABS
20.0000 mg | ORAL_TABLET | Freq: Every day | ORAL | Status: DC
  Administered 2024-05-13 – 2024-05-17 (×5): 20 mg
  Filled 2024-05-13 (×5): qty 2

## 2024-05-13 NOTE — Progress Notes (Signed)
   05/13/24 0755  Daily Weaning Assessment  Daily Assessment of Readiness to Wean Wean protocol criteria met (SBT performed)  SBT Method CPAP 5 cm H20 and PS 5 cm H20 (10/5)  Weaning Start Time 0755  Patient response Passed (Tolerated well)

## 2024-05-13 NOTE — Progress Notes (Addendum)
 LTM maint complete - no skin breakdown seen. [facility] monitored, Event button test confirmed by [facility].

## 2024-05-13 NOTE — Progress Notes (Signed)
 NAME:  Beth Macdonald, MRN:  [id number], DOB:  Dec 13, 1945, LOS: 2 ADMISSION DATE:  05/11/2024 CONSULTATION DATE:  05/11/2024 REFERRING MD:  Gennaro - EDP, CHIEF COMPLAINT: Code Stroke, seizures   History of Present Illness:  78 year old woman who presented to [facility] ED 9/8 via EMS as a Code Stroke, concern for seizure-like activity. PMHx significant for HLD, CVA (recent L frontal infarct), TIAs, Bell's palsy, DVT, T2DM c/b neuropathy, SCC (skin). Presented to [facility] earlier on day of admission for low back pain and UTI, prescribed additional-day course of Keflex .   Patient is intubated, therefore history is obtained primarily from chart review and family (wife, [name]) at bedside. Patient's wife states that she had recently been feeling poorly with concern for UTI, states kidneys were a concern due to associated back pain. Presented to [facility] earlier in the day 9/8; wife states she noticed rhythmic L shoulder movement c/f seizure. No history of seizures. This worsened at home and speech became slurred. Patient was then BIB EMS from home with seizure-like activity, weakness and R gaze preference. LKW 1500. NIHSS 38 with symptoms including decreased LOC with disorientation/inability to follow commands, R gaze preference, L facial droop, bilateral UE/LE weakness, global aphasia/dysarthria. Labs were notable for WBC 23.5, Hgb/Plt WNL. INR 1.2. Na 140, K 3.5, CO2 10, BUN/Cr 13/1.50 (baseline 1.3-1.5), LFTs WNL. Ethanol WNL. UA earlier in the day with trace ketones and protein. CT Head was negative for ICH, +chronic small vessel disease with parenchymal volume loss and encephalomalacia of the L frontal lobe/R parietal lobe 2/2 remote infarcts. 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 Neurology was consulted.   Patient was administered Ativan  2mg  and Keppra  load initiated.  Patient mental status declined and she was no longer able to protect her airway; she was subsequently intubated in ED.  PCCM consulted for admission.  Pertinent Medical History:   Past Medical History:  Diagnosis Date   Aortic atherosclerosis (HCC)    Diabetes mellitus without complication (HCC)    Type 2   Diabetic neuropathy (HCC)    DVT of proximal lower limb (HCC)    Facial paralysis/Bells palsy    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HCC)    Hyperlipidemia    Mitral valve insufficiency    Osteoarthritis    SCCA (squamous cell carcinoma) of skin 03/22/2021   Bridge of nose (in situ)   Skull fracture (HCC)    age 2   Squamous cell carcinoma of skin 03/22/2021   Right forehead (in situ)   TIA (transient ischemic attack) 2020   Varicose vein of leg    Significant Hospital Events: Including procedures, antibiotic start and stop dates in addition to other pertinent events   9/8 - Presented to [facility] ED for Code Stroke, seizure-like activity. Previously seen at [facility] ED for UTI, prescribed additional Keflex  x 5-day course. CT Head/CTA Head/Neck as above. Received Ativan /Keppra  load. Required intubation for airway protection. PCCM consulted. 9/9 No seizure activity noted on cEEG. LP performed, low concern for CNS infection based on CSF results.  Interim History / Subjective:   Decreased UOP overnight, added fluids On PSV 10/5 but not following commands LP performed yesterday, low concern for CSF infection   Objective:  Blood pressure (!) 143/99, pulse (!) 123, temperature 98.9 F (37.2 C), temperature source Axillary, resp. rate (!) 25, height 5' 8 (1.727 m), weight 70.9 kg, SpO2 99%.  Vent Mode: PRVC FiO2 (%):  [40 %] 40 % Set Rate:  [16 bmp] 16 bmp Vt Set:  [510 mL] 510 mL PEEP:  [5 cmH20] 5 cmH20 Pressure Support:  [10 cmH20] 10 cmH20 Plateau Pressure:  [14 cmH20-15 cmH20] 15 cmH20   Intake/Output Summary (Last 24 hours) at 05/13/2024 0734 Last data filed at  05/13/2024 0700 Gross per 24 hour  Intake 1001.01 ml  Output 475 ml  Net 526.01 ml   Filed Weights   05/11/24 2200 05/12/24 0500 05/13/24 0500  Weight: 69.5 kg 71.6 kg 70.9 kg   Physical Examination: General: Acutely ill-appearing elderly woman, intubated HEENT: [place]/AT, anicteric sclera, PERRL, moist mucous membranes Neuro: Lethargic. Does not withdraw to pain in lower extremities. Cough present with deep sution. Not following commands. No spontaneous movement of extremities on my exam. +Corneal, +Cough, and +Gag  CV: tachycardic, no m/g/r. PULM: clear to auscultation GI: Soft, nontender, nondistended. Normoactive bowel sounds. Extremities: No LE edema noted. Skin: Warm/dry, no rashes.  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 Two clusters of seizure-like activity noted (home, ED). CT Head negative for ICH, +chronic small vessel disease with parenchymal volume loss and encephalomalacia of the L frontal lobe/R parietal lobe 2/2 remote infarcts. 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 - Neuro following, appreciate recommendations - MRI brain reveals acute findings most consistent with seizure-related edema in the right hippocampal formation. The overall distribution of the DWI signal changes, restricted to the hippocampal formation without accompanying hemorrhage, does not appear consistent with herpes encephalitis.  - CSF negative for infection - AEDs per Neuro (Keppra , Versed  PRN) - on going encephalopathy due to seizures - Seizure precautions - Frequent neuro checks - Neuroprotective measures: HOB > 30  degrees, normoglycemia, normothermia, electrolytes WNL - PT/OT/SLP when able to participate in care  Acute respiratory insufficiency in the setting of obtundation requiring mechanical ventilation - Continue full vent support (4-8cc/kg IBW) - Wean FiO2 for O2 sat > 90% - WUA/SBT today AM as no seizures noted on cEEG - VAP bundle - Pulmonary hygiene - PAD protocol for sedation: Propofol  and Fentanyl  for goal RASS 0 to -1; low threshold to change Prop to alternative agent given history of elevated TG, will trend - check tracheal aspirate  HLD Mitral valve insufficiency, mild Recent lipid panel 8/9 with total cholesterol 120, TG 164, HDL/LDL/VLDL WNL. - Consider repeat Echo - Resume ASA/Plavix /statin as clinically appropriate - Hold home Repatha  for now - Cardiac monitoring  T2DM Diabetic peripheral neuropathy - SSI - CBGs Q4H - Goal CBG 140-180 - Hold home metformin , semaglutide   Recent UTI Prescribed Keflex  x additional 5-day course for persistent symptoms (initial course 9/3-9/8). ?Cephalexin  lowering seizure threshold, but not an agent strongly associated with this in the absence of high doses/significant renal impairment. - UA from [facility] ED 9/8AM unimpressive - Hold cephalexin  for now, consider alternative agent - Trend WBC, fever curve - F/u Cx data  Dementia, baseline - Resume home Aricept   Hypokalemia Hypomagnesemia - replete as needed  Ketonuria - likely starvation ketosis, start D5LR 40mL/hr yesterday - adding tube feeds today  Labs:  CBC: Recent Labs  Lab 05/11/24 1030 05/11/24 1800 05/11/24 1805 05/11/24  2211 05/12/24 0554  WBC 13.5* 23.5*  --   --  24.4*  NEUTROABS  --  17.8*  --   --   --   HGB 14.8 14.2 16.0* 13.3 13.7  HCT 43.9 45.7 47.0* 39.0 40.3  MCV 90.5 98.7  --   --  90.0  PLT 345 389  --   --  355   Basic Metabolic Panel: Recent Labs  Lab 05/11/24 1030 05/11/24 1800 05/11/24 1805 05/11/24 2211 05/12/24 0554  NA 139 140 138 137 138   K 3.4* 3.5 3.4* 3.2* 3.4*  CL 97* 97* 102  --  99  CO2 24 10*  --   --  19*  GLUCOSE 113* 248* 233*  --  159*  BUN 13 13 16   --  16  CREATININE 0.92 1.50* 1.10*  --  1.17*  CALCIUM  9.9 9.5  --   --  9.4  MG  --   --   --   --  1.5*  PHOS  --   --   --   --  3.3   GFR: Estimated Creatinine Clearance: 38.7 mL/min (A) (by C-G formula based on SCr of 1.17 mg/dL (H)). Recent Labs  Lab 05/11/24 1030 05/11/24 1800 05/11/24 2234 05/12/24 0554  PROCALCITON  --   --  2.30  --   WBC 13.5* 23.5*  --  24.4*  LATICACIDVEN  --   --  2.1* 2.1*   Liver Function Tests: Recent Labs  Lab 05/11/24 1030 05/11/24 1800  AST 20 37  ALT 13 17  ALKPHOS 58 52  BILITOT 0.7 0.8  PROT 7.6 7.4  ALBUMIN  4.1 3.3*   No results for input(s): LIPASE, AMYLASE in the last 168 hours. No results for input(s): AMMONIA in the last 168 hours.  ABG:    Component Value Date/Time   PHART 7.349 (L) 05/11/2024 2211   PCO2ART 34.4 05/11/2024 2211   PO2ART 574 (H) 05/11/2024 2211   HCO3 19.0 (L) 05/11/2024 2211   TCO2 20 (L) 05/11/2024 2211   ACIDBASEDEF 6.0 (H) 05/11/2024 2211   O2SAT 100 05/11/2024 2211    Coagulation Profile: Recent Labs  Lab 05/11/24 1800  INR 1.2   Cardiac Enzymes: No results for input(s): CKTOTAL, CKMB, CKMBINDEX, TROPONINI in the last 168 hours.  HbA1C: Hgb A1c MFr Bld  Date/Time Value Ref Range Status  03/14/2024 09:36 PM 8.4 (H) 4.8 - 5.6 % Final    Comment:    (NOTE)         Prediabetes: 5.7 - 6.4         Diabetes: >6.4         Glycemic control for adults with diabetes: <7.0   11/15/2022 04:36 AM 6.7 (H) 4.8 - 5.6 % Final    Comment:    (NOTE)         Prediabetes: 5.7 - 6.4         Diabetes: >6.4         Glycemic control for adults with diabetes: <7.0    CBG: Recent Labs  Lab 05/12/24 1516 05/12/24 1917 05/12/24 2316 05/13/24 0314 05/13/24 0732  GLUCAP 214* 199* 216* 236* 192*    Critical care time:    The patient is critically ill with  multiple organ system failure and requires high complexity decision making for assessment and support, frequent evaluation and titration of therapies, advanced monitoring, review of radiographic studies and interpretation of complex data.   Critical Care Time devoted to patient care  services, exclusive of separately billable procedures, described in this note is 32 minutes.  Dorn Chill, MD [facility] Office: [phone]   See Amion for personal pager PCCM on call pager [phone] until 7pm. Please call Elink 7p-7a. [phone]

## 2024-05-13 NOTE — Procedures (Addendum)
 Patient Name: Beth Macdonald  MRN: [id number]  Epilepsy Attending: Arlin [name]  Referring Physician/Provider: Khaliqdina, Salman, MD  Duration: 05/13/2024 0206 to 05/13/2024 1157   Patient history: 78 y.o. 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  EEG to evaluate for seizure   Level of alertness: lethargic    AEDs during EEG study:  LEV   Technical aspects: This EEG study was done with scalp electrodes positioned according to the 10-20 International system of electrode placement. Electrical activity was reviewed with band pass filter of 1-70Hz , sensitivity of 7 uV/mm, display speed of 2mm/sec with a 60Hz  notched filter applied as appropriate. EEG data were recorded continuously and digitally stored.  Video monitoring was available and reviewed as appropriate.   Description: EEG showed continuous generalized and lateralized right hemisphere 3 to 6 Hz theta-delta slowing with overriding 13 to 15 Hz beta activity. Sharp waves were noted in right hemisphere, every 2-15 seconds. Hyperventilation and photic stimulation were not performed.      ABNORMALITY - Sharp waves, left hemisphere - Continuous slow, generalized and lateralized right hemisphere   IMPRESSION: This study showed evidence of epileptogenicity and cortical dysfunction arising from right hemisphere, likely secondary to underlying structural abnormality.  Additionally there is a diffuse encephalopathy, likely related to sedation.  No seizures were noted during the study.   Torren Maffeo O Jasey Cortez

## 2024-05-13 NOTE — Progress Notes (Signed)
 Pt transported on vent to MRI and back without incident.

## 2024-05-13 NOTE — Progress Notes (Signed)
 Initial Nutrition Assessment  DOCUMENTATION CODES:   Non-severe (moderate) malnutrition in context of chronic illness  INTERVENTION:   Initiate tube feeding via OG tube: [employer] 1.4 at 20 ml/h and increase by 10 ml every 8 hours to goal rate of 50 ml/hr (1200 ml per day)  Provides 1680 kcal, 74 gm protein, 852 ml free water  daily  100 mg thiamine  daily  MVI with minerals daily    NUTRITION DIAGNOSIS:   Moderate Malnutrition related to chronic illness as evidenced by moderate muscle depletion, mild fat depletion.  GOAL:   Patient will meet greater than or equal to 90% of their needs  MONITOR:   TF tolerance  REASON FOR ASSESSMENT:   Consult Enteral/tube feeding initiation and management  ASSESSMENT:   Pt with PMH of HLD, CVA (recent L frontal infarct), TIAs, Bell's palsy, DVT, DM, SCC (skin) admitted with seizure-like activity.    Pt discussed during ICU rounds and with RN and MD.  No sedation, pt did open eyes when called by name during visit.   Medications reviewed and include: colace BID, SSI every 4 hours, protonix , miralax , Phos-nak 2 packets every 4 hours x 4  Labs reviewed:  K 3.4 -> 3.3 Phos 3.3 -> 2.2 CBG's: 188-236  18 OG tube; gastric per xray    NUTRITION - FOCUSED PHYSICAL EXAM:  Flowsheet Row Most Recent Value  Orbital Region Moderate depletion  Upper Arm Region Mild depletion  Thoracic and Lumbar Region No depletion  Buccal Region Moderate depletion  Temple Region Severe depletion  Clavicle Bone Region Mild depletion  Clavicle and Acromion Bone Region Severe depletion  Scapular Bone Region Moderate depletion  Dorsal Hand Moderate depletion  Patellar Region Moderate depletion  Anterior Thigh Region Moderate depletion  Posterior Calf Region Severe depletion  Edema (RD Assessment) None  Hair Reviewed  Eyes Reviewed  Mouth Unable to assess  Skin Reviewed  Nails Reviewed    Diet Order:   Diet Order             Diet NPO time  specified  Diet effective now                   EDUCATION NEEDS:   Not appropriate for education at this time  Skin:  Skin Assessment: Reviewed RN Assessment  Last BM:  unknown  Height:   Ht Readings from Last 1 Encounters:  05/11/24 5' 8 (1.727 m)    Weight:   Wt Readings from Last 1 Encounters:  05/13/24 70.9 kg    Ideal Body Weight:     BMI:  Body mass index is 23.77 kg/m.  Estimated Nutritional Needs:   Kcal:  1500-1700  Protein:  85-100 grams  Fluid:  >1.5 L/day  Powell SQUIBB., RD, LDN, CNSC See AMiON for contact information

## 2024-05-13 NOTE — Progress Notes (Signed)
 Subjective: had lumbar puncture yesterday, no CSF pleocytosis.  Continues to be encephalopathic today  ROS: Unable to obtain due to poor mental status  Examination  Vital signs in last 24 hours: Temp:  [97.9 F (36.6 C)-100.2 F (37.9 C)] 97.9 F (36.6 C) (09/10 1200) Pulse Rate:  [105-123] 113 (09/10 1200) Resp:  [15-25] 16 (09/10 1200) BP: (126-149)/(71-106) 134/90 (09/10 1200) SpO2:  [98 %-100 %] 98 % (09/10 1200) FiO2 (%):  [40 %] 40 % (09/10 1139) Weight:  [70.9 kg] 70.9 kg (09/10 0500)  General: lying in bed, intubated Neuro: Eyes appear to be open, pupils equally round and reactive to light, however not tracking examiner, did squeeze my hand with her right hand but did not follow any other commands, withdraws to noxious stimuli in right upper and right lower extremity, no movement in left upper and left lower extremity  Basic Metabolic Panel: Recent Labs  Lab 05/11/24 1030 05/11/24 1800 05/11/24 1805 05/11/24 2211 05/12/24 0554 05/13/24 0825  NA 139 140 138 137 138 142  K 3.4* 3.5 3.4* 3.2* 3.4* 3.3*  CL 97* 97* 102  --  99 105  CO2 24 10*  --   --  19* 21*  GLUCOSE 113* 248* 233*  --  159* 211*  BUN 13 13 16   --  16 23  CREATININE 0.92 1.50* 1.10*  --  1.17* 1.33*  CALCIUM  9.9 9.5  --   --  9.4 9.4  MG  --   --   --   --  1.5* 2.4  PHOS  --   --   --   --  3.3 2.2*    CBC: Recent Labs  Lab 05/11/24 1030 05/11/24 1800 05/11/24 1805 05/11/24 2211 05/12/24 0554 05/13/24 0825  WBC 13.5* 23.5*  --   --  24.4* 19.9*  NEUTROABS  --  17.8*  --   --   --  17.2*  HGB 14.8 14.2 16.0* 13.3 13.7 13.9  HCT 43.9 45.7 47.0* 39.0 40.3 41.2  MCV 90.5 98.7  --   --  90.0 89.8  PLT 345 389  --   --  355 379     Coagulation Studies: Recent Labs    05/11/24 1800  LABPROT 16.1*  INR 1.2    Imaging No new brain imaging overnight   ASSESSMENT AND PLAN: 78 year old female with recent left frontal infarct, prior multiple strokes, recent UTI on Keflex  who presented  with status epilepticus which has since resolved   Focal convulsive status epilepticus, resolved Leukocytosis, improving UTI Acute encephalopathy - Etiology of seizure: Likely in the setting of UTI in a patient with underlying strokes - Unclear why patient is so encephalopathic.  Will repeat MRI brain with and without contrast to look for any acute abnormality -If exam does not improve, will consider autoimmune workup and empiric steroids - Continue Keppra  500 mg twice daily - As needed IV Versed  for clinical seizures - DC LTM EEG - Discussed with ICU team   CRITICAL CARE Performed by: Arlin [name]     Total critical care time: 35 minutes   Critical care time was exclusive of separately billable procedures and treating other patients.   Critical care was necessary to treat or prevent imminent or life-threatening deterioration.   Critical care was time spent personally by me on the following activities: development of treatment plan with patient and/or surrogate as well as nursing, discussions with consultants, evaluation of patient's response to treatment, examination of patient, obtaining  history from patient or surrogate, ordering and performing treatments and interventions, ordering and review of laboratory studies, ordering and review of radiographic studies, pulse oximetry and re-evaluation of patient's condition.      [name] Epilepsy [facility] For questions after 5pm please refer to AMION to reach the Neurologist on call

## 2024-05-13 NOTE — Progress Notes (Signed)
vLTM discontinued  No skin breakdown noted at d/c  [facility] notified

## 2024-05-14 ENCOUNTER — Inpatient Hospital Stay: Payer: MEDICARE | Admitting: Adult Health

## 2024-05-14 ENCOUNTER — Inpatient Hospital Stay (HOSPITAL_COMMUNITY): Payer: MEDICARE

## 2024-05-14 DIAGNOSIS — R569 Unspecified convulsions: Secondary | ICD-10-CM | POA: Diagnosis not present

## 2024-05-14 DIAGNOSIS — E440 Moderate protein-calorie malnutrition: Secondary | ICD-10-CM | POA: Insufficient documentation

## 2024-05-14 DIAGNOSIS — Z452 Encounter for adjustment and management of vascular access device: Secondary | ICD-10-CM | POA: Diagnosis not present

## 2024-05-14 DIAGNOSIS — Z4682 Encounter for fitting and adjustment of non-vascular catheter: Secondary | ICD-10-CM | POA: Diagnosis not present

## 2024-05-14 DIAGNOSIS — G40109 Localization-related (focal) (partial) symptomatic epilepsy and epileptic syndromes with simple partial seizures, not intractable, without status epilepticus: Secondary | ICD-10-CM | POA: Diagnosis not present

## 2024-05-14 DIAGNOSIS — D72829 Elevated white blood cell count, unspecified: Secondary | ICD-10-CM | POA: Diagnosis not present

## 2024-05-14 DIAGNOSIS — I959 Hypotension, unspecified: Secondary | ICD-10-CM

## 2024-05-14 DIAGNOSIS — G9340 Encephalopathy, unspecified: Secondary | ICD-10-CM | POA: Diagnosis not present

## 2024-05-14 DIAGNOSIS — J189 Pneumonia, unspecified organism: Secondary | ICD-10-CM

## 2024-05-14 DIAGNOSIS — I213 ST elevation (STEMI) myocardial infarction of unspecified site: Secondary | ICD-10-CM

## 2024-05-14 DIAGNOSIS — N390 Urinary tract infection, site not specified: Secondary | ICD-10-CM | POA: Diagnosis not present

## 2024-05-14 LAB — GLUCOSE, CAPILLARY
Glucose-Capillary: 106 mg/dL — ABNORMAL HIGH (ref 70–99)
Glucose-Capillary: 171 mg/dL — ABNORMAL HIGH (ref 70–99)
Glucose-Capillary: 180 mg/dL — ABNORMAL HIGH (ref 70–99)
Glucose-Capillary: 236 mg/dL — ABNORMAL HIGH (ref 70–99)
Glucose-Capillary: 225 mg/dL — ABNORMAL HIGH (ref 70–99)
Glucose-Capillary: 268 mg/dL — ABNORMAL HIGH (ref 70–99)
Glucose-Capillary: 287 mg/dL — ABNORMAL HIGH (ref 70–99)

## 2024-05-14 LAB — SEDIMENTATION RATE: Sed Rate: 83 mm/h — ABNORMAL HIGH (ref 0–22)

## 2024-05-14 LAB — CBC WITH DIFFERENTIAL/PLATELET
Abs Immature Granulocytes: 0.11 K/uL — ABNORMAL HIGH (ref 0.00–0.07)
Basophils Absolute: 0 K/uL (ref 0.0–0.1)
Basophils Relative: 0 %
Eosinophils Absolute: 0 K/uL (ref 0.0–0.5)
Eosinophils Relative: 0 %
HCT: 41.5 % (ref 36.0–46.0)
Hemoglobin: 13.9 g/dL (ref 12.0–15.0)
Immature Granulocytes: 1 %
Lymphocytes Relative: 5 %
Lymphs Abs: 0.8 K/uL (ref 0.7–4.0)
MCH: 30.6 pg (ref 26.0–34.0)
MCHC: 33.5 g/dL (ref 30.0–36.0)
MCV: 91.4 fL (ref 80.0–100.0)
Monocytes Absolute: 1.1 K/uL — ABNORMAL HIGH (ref 0.1–1.0)
Monocytes Relative: 6 %
Neutro Abs: 15.2 K/uL — ABNORMAL HIGH (ref 1.7–7.7)
Neutrophils Relative %: 88 %
Platelets: 344 K/uL (ref 150–400)
RBC: 4.54 MIL/uL (ref 3.87–5.11)
RDW: 13.9 % (ref 11.5–15.5)
WBC: 17.3 K/uL — ABNORMAL HIGH (ref 4.0–10.5)
nRBC: 0 % (ref 0.0–0.2)

## 2024-05-14 LAB — PHOSPHORUS: Phosphorus: 3.1 mg/dL (ref 2.5–4.6)

## 2024-05-14 LAB — BASIC METABOLIC PANEL WITH GFR
Anion gap: 15 (ref 5–15)
BUN: 42 mg/dL — ABNORMAL HIGH (ref 8–23)
CO2: 23 mmol/L (ref 22–32)
Calcium: 9.2 mg/dL (ref 8.9–10.3)
Chloride: 110 mmol/L (ref 98–111)
Creatinine, Ser: 1.53 mg/dL — ABNORMAL HIGH (ref 0.44–1.00)
GFR, Estimated: 34 mL/min — ABNORMAL LOW (ref >60)
Glucose, Bld: 181 mg/dL — ABNORMAL HIGH (ref 70–99)
Potassium: 3.2 mmol/L — ABNORMAL LOW (ref 3.5–5.1)
Sodium: 148 mmol/L — ABNORMAL HIGH (ref 135–145)

## 2024-05-14 LAB — RPR: RPR Ser Ql: NONREACTIVE

## 2024-05-14 LAB — HIV ANTIBODY (ROUTINE TESTING W REFLEX): HIV Screen 4th Generation wRfx: NONREACTIVE

## 2024-05-14 LAB — C-REACTIVE PROTEIN: CRP: 21.1 mg/dL — ABNORMAL HIGH (ref <1.0)

## 2024-05-14 LAB — MAGNESIUM: Magnesium: 2.2 mg/dL (ref 1.7–2.4)

## 2024-05-14 LAB — CK: Total CK: 42 U/L (ref 38–234)

## 2024-05-14 LAB — FERRITIN: Ferritin: 757 ng/mL — ABNORMAL HIGH (ref 11–307)

## 2024-05-14 MED ORDER — POLYETHYLENE GLYCOL 3350 17 G PO PACK
17.0000 g | PACK | Freq: Two times a day (BID) | ORAL | Status: DC
  Administered 2024-05-14 – 2024-05-16 (×3): 17 g
  Filled 2024-05-14 (×3): qty 1

## 2024-05-14 MED ORDER — POTASSIUM CHLORIDE 20 MEQ PO PACK
40.0000 meq | PACK | Freq: Once | ORAL | Status: AC
  Administered 2024-05-14: 40 meq
  Filled 2024-05-14: qty 2

## 2024-05-14 MED ORDER — FENTANYL CITRATE PF 50 MCG/ML IJ SOSY
50.0000 ug | PREFILLED_SYRINGE | INTRAMUSCULAR | Status: DC | PRN
  Administered 2024-05-14: 100 ug via INTRAVENOUS
  Administered 2024-05-15: 50 ug via INTRAVENOUS
  Filled 2024-05-14: qty 1
  Filled 2024-05-14 (×2): qty 2

## 2024-05-14 MED ORDER — POTASSIUM CHLORIDE 20 MEQ PO PACK: 40.0000 meq | PACK | ORAL | Status: DC

## 2024-05-14 MED ORDER — FENTANYL CITRATE PF 50 MCG/ML IJ SOSY: 50.0000 ug | PREFILLED_SYRINGE | Freq: Once | INTRAMUSCULAR | Status: AC

## 2024-05-14 MED ORDER — INSULIN GLARGINE 100 UNIT/ML ~~LOC~~ SOLN
5.0000 [IU] | Freq: Every day | SUBCUTANEOUS | Status: DC
  Administered 2024-05-14: 5 [IU] via SUBCUTANEOUS
  Filled 2024-05-14 (×2): qty 0.05

## 2024-05-14 MED ORDER — SODIUM CHLORIDE 0.9 % IV SOLN
1000.0000 mg | INTRAVENOUS | Status: AC
  Administered 2024-05-14 – 2024-05-16 (×3): 1000 mg via INTRAVENOUS
  Filled 2024-05-14 (×3): qty 16

## 2024-05-14 MED ORDER — SODIUM CHLORIDE 0.9 % IV SOLN
2.0000 g | INTRAVENOUS | Status: AC
  Administered 2024-05-14 – 2024-05-18 (×5): 2 g via INTRAVENOUS
  Filled 2024-05-14 (×5): qty 20

## 2024-05-14 MED ORDER — LACTATED RINGERS IV BOLUS
1000.0000 mL | Freq: Once | INTRAVENOUS | Status: AC
  Administered 2024-05-14: 1000 mL via INTRAVENOUS

## 2024-05-14 MED ORDER — NOREPINEPHRINE 4 MG/250ML-% IV SOLN
0.0000 ug/min | INTRAVENOUS | Status: DC
  Administered 2024-05-14 – 2024-05-15 (×2): 5 ug/min via INTRAVENOUS
  Filled 2024-05-14: qty 250

## 2024-05-14 MED ORDER — FENTANYL CITRATE PF 50 MCG/ML IJ SOSY
PREFILLED_SYRINGE | INTRAMUSCULAR | Status: AC
  Administered 2024-05-15: 50 ug via INTRAVENOUS
  Filled 2024-05-14: qty 1

## 2024-05-14 MED ORDER — DEXMEDETOMIDINE HCL IN NACL 400 MCG/100ML IV SOLN
0.0000 ug/kg/h | INTRAVENOUS | Status: DC
  Administered 2024-05-14: 0.4 ug/kg/h via INTRAVENOUS
  Administered 2024-05-15: 0.6 ug/kg/h via INTRAVENOUS
  Administered 2024-05-15 – 2024-05-17 (×3): 0.4 ug/kg/h via INTRAVENOUS
  Filled 2024-05-14: qty 200
  Filled 2024-05-14 (×4): qty 100

## 2024-05-14 MED ORDER — FREE WATER
300.0000 mL | Status: DC
  Administered 2024-05-14 – 2024-05-17 (×18): 300 mL

## 2024-05-14 MED ORDER — NOREPINEPHRINE 4 MG/250ML-% IV SOLN
INTRAVENOUS | Status: AC
  Filled 2024-05-14: qty 250

## 2024-05-14 NOTE — Progress Notes (Addendum)
 eLink Physician-Brief Progress Note Patient Name: Beth Macdonald DOB: 06-09-1946 MRN: [id number]   Date of Service  05/14/2024  HPI/Events of Note  Patient with hypotension, SBP 68. Following aggressive resuscitation attempts with Fluids and Levophed  patient now with antero-septal ST segment changes.  eICU Interventions  LR 500 ml fluid bolus stat, Levophed  gtt. Troponin ordered, Cardiology consulted. PCCM ground crew requested to come and place a central line due to limited vascular access.        [name] 05/14/2024, 11:23 PM

## 2024-05-14 NOTE — Procedures (Signed)
 Central Venous Catheter Insertion Procedure Note  Beth Macdonald  [id number]  Jul 11, 1946  Date:05/14/24  Time:11:54 PM   Provider Performing:Thelton Graca D Emilio   Procedure: Insertion of Non-tunneled Central Venous Catheter(36556) with US  guidance (23062)   Indication(s) Medication administration  Consent Risks of the procedure as well as the alternatives and risks of each were explained to the patient and/or caregiver.  Consent for the procedure was obtained and is signed in the bedside chart  Anesthesia Topical only with 1% lidocaine    Timeout Verified patient identification, verified procedure, site/side was marked, verified correct patient position, special equipment/implants available, medications/allergies/relevant history reviewed, required imaging and test results available.  Sterile Technique Maximal sterile technique including full sterile barrier drape, hand hygiene, sterile gown, sterile gloves, mask, hair covering, sterile ultrasound probe cover (if used).  Procedure Description Area of catheter insertion was cleaned with chlorhexidine  and draped in sterile fashion.  With real-time ultrasound guidance a central venous catheter was placed into the left internal jugular vein. Nonpulsatile blood flow and easy flushing noted in all ports.  The catheter was sutured in place and sterile dressing applied.  Complications/Tolerance None; patient tolerated the procedure well. Chest X-ray is ordered to verify placement for internal jugular or subclavian cannulation.   Chest x-ray is not ordered for femoral cannulation.  EBL Minimal  Specimen(s) None  JD Emilio RIGGERS [facility] 05/14/2024, 11:55 PM  Please see [email or web address] for pager details.  From 7A-7P if no response, please call [phone]. After hours, please call ELink [phone].

## 2024-05-14 NOTE — Consult Note (Signed)
 [facility]    Date of Admission:  05/11/2024      Total days of antibiotics 1  Ceftriaxone               Reason for Consult: R?O meningitis     Referring Provider: Kara  Primary Care Provider: Clarice Nottingham, MD   Assessment: Beth Macdonald is a 78 y.o. female admitted with    Focal convulsive status epilepticus - Neurology following and with some concern with new seizures may be bacterial meningoencephalitis. ME PCR panel is negative which was collected off antibiotics. Does have some leukocytosis and clinical findings concerning for pneumonia which could explain. MRI with some concern for infectious etiology with neuro contrast enhancement. This could also be explained by possible autoimmune / post-ictal changes.  With LP done off antibiotics < 5 WBC and normal protein, matched glucose to serum agree with neurology this does not clinically correlate to be infectious etiology. *Request to consider steroids for consideration of autoimmune causes - no absolute contraindications. Suspect we can continue her antibiotics a total of 7 days given hospitalized. CXR without any complicating feature. She is not on much regarding vent support, fortunately and starting to wake.   Pneumonia concern, GNRs Growing from Sputum -  UTI? Unable to differentiate symptoms with her but well documented recent acute dysuria/lower abd pain PTA.  Continue the course with Ceftriaxone   Nasal MRSA (-) so no vancomycin needed.  PCCM is following and will adjust as needed.     Plan: No contraindications to steroid use based on review of available information and no microbiologic evidence for infection  Would do 7d course pneumonia treatment  PCCM to follow micro and adjust as indicated   Principal Problem:   Seizure (HCC) Active Problems:   Malnutrition of moderate degree    aspirin   81 mg Per Tube Daily   atorvastatin   20 mg Per Tube Daily   Chlorhexidine   Gluconate Cloth  6 each Topical Daily   clopidogrel   75 mg Per Tube Daily   docusate  100 mg Per Tube BID   free water   300 mL Per Tube Q4H   heparin  injection (subcutaneous)  5,000 Units Subcutaneous Q8H   insulin  aspart  0-15 Units Subcutaneous Q4H   levETIRAcetam   500 mg Intravenous Q12H   multivitamin with minerals  1 tablet Per Tube Daily   nystatin    Topical BID   mouth rinse  15 mL Mouth Rinse Q2H   pantoprazole  (PROTONIX ) IV  40 mg Intravenous QHS   polyethylene glycol  17 g Per Tube Daily   thiamine   100 mg Per Tube Daily    HPI: Beth Macdonald is a 78 y.o. female admitted from home with code stroke / seizures.   Brought to hospital 9.8 via EMS with concern for seizure like activity. Was at [facility] earlier that same day with low back pain and suspected UTI (Rx for cephalexin  provided).   She was intubated in the field so history is only from wife (karen) and chart. 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   She has a history of dementia and brain loop recorder.    Review of Systems: ROS  Past Medical History:  Diagnosis Date   Aortic atherosclerosis (HCC)    Diabetes mellitus without complication (HCC)    Type 2  Diabetic neuropathy (HCC)    DVT of proximal lower limb (HCC)    Facial paralysis/Bells palsy    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HCC)    Hyperlipidemia    Mitral valve insufficiency    Osteoarthritis    SCCA (squamous cell carcinoma) of skin 03/22/2021   Bridge of nose (in situ)   Skull fracture [facility])    age 40   Squamous cell carcinoma of skin 03/22/2021   Right forehead (in situ)   TIA (transient ischemic attack) 2020   Varicose vein of leg     Social History   Tobacco Use   Smoking status: Former    Current packs/day: 0.00    Average packs/day: 1 pack/day for 20.0 years (20.0 ttl pk-yrs)     Types: Cigarettes    Start date: 36    Quit date: 39    Years since quitting: 45.7   Smokeless tobacco: Never  Vaping Use   Vaping status: Never Used  Substance Use Topics   Alcohol use: Yes    Comment: social/rarely   Drug use: Never    Family History  Problem Relation Age of Onset   Stroke Mother    Hypertension Father    Dementia Father    Leukemia Brother    Stroke Brother    Colon cancer Neg Hx    Esophageal cancer Neg Hx    Pancreatic cancer Neg Hx    Stomach cancer Neg Hx    Allergies  Allergen Reactions   Clindamycin/Lincomycin Other (See Comments)    Cannot tolerate mycins causes C-Diff   Erythromycin     Other Reaction(s): Not available  erythromycin   Milk-Related Compounds Diarrhea   Tetracyclines & Related Other (See Comments)    Feel really nervous    OBJECTIVE: Blood pressure 119/75, pulse (!) 111, temperature 98.7 F (37.1 C), temperature source Axillary, resp. rate 18, height 5' 8 (1.727 m), weight 70.9 kg, SpO2 96%.  Physical Exam Constitutional:      Appearance: She is ill-appearing.  Eyes:     Pupils: Pupils are equal, round, and reactive to light.  Cardiovascular:     Rate and Rhythm: Tachycardia present.  Pulmonary:     Effort: Pulmonary effort is normal.     Breath sounds: Normal breath sounds.     Comments: Coarse BS on vent Abdominal:     General: There is no distension.     Tenderness: There is no abdominal tenderness.  Skin:    Capillary Refill: Capillary refill takes less than 2 seconds.  Neurological:     Comments: sedated     Lab Results Lab Results  Component Value Date   WBC 17.3 (H) 05/14/2024   HGB 13.9 05/14/2024   HCT 41.5 05/14/2024   MCV 91.4 05/14/2024   PLT 344 05/14/2024    Lab Results  Component Value Date   CREATININE 1.53 (H) 05/14/2024   BUN 42 (H) 05/14/2024   NA 148 (H) 05/14/2024   K 3.2 (L) 05/14/2024   CL 110 05/14/2024   CO2 23 05/14/2024    Lab Results  Component Value Date    ALT 17 05/11/2024   AST 37 05/11/2024   ALKPHOS 52 05/11/2024   BILITOT 0.8 05/11/2024     Microbiology: Recent Results (from the past 240 hours)  Urine Culture     Status: None   Collection Time: 05/11/24  1:27 PM   Specimen: Urine, Clean Catch  Result Value Ref Range Status   Specimen Description  Final    URINE, CLEAN CATCH Performed at [facility], [address], [place], [place] [address]    Special Requests   Final    NONE Performed at [facility], [address] [place], [place] [address]    Culture   Final    NO GROWTH Performed at [facility], [address], [place], [place] [address]    Report Status 05/12/2024 FINAL  Final  MRSA Next Gen by PCR, Nasal     Status: None   Collection Time: 05/11/24  7:49 PM   Specimen: Nasal Mucosa; Nasal Swab  Result Value Ref Range Status   MRSA by PCR Next Gen NOT DETECTED NOT DETECTED Final    Comment: (NOTE) The GeneXpert MRSA Assay (FDA approved for NASAL specimens only), is one component of a comprehensive MRSA colonization surveillance program. It is not intended to diagnose MRSA infection nor to guide or monitor treatment for MRSA infections. Test performance is not FDA approved in patients less than 1 years old. Performed at [facility], [address] [place], [place] [address]   Culture, blood (Routine X 2) w Reflex to ID Panel     Status: None (Preliminary result)   Collection Time: 05/11/24 10:22 PM   Specimen: BLOOD RIGHT HAND  Result Value Ref Range Status   Specimen Description BLOOD RIGHT HAND  Final   Special Requests   Final    BOTTLES DRAWN AEROBIC AND ANAEROBIC Blood Culture results may not be optimal due to an inadequate volume of blood received in culture bottles   Culture   Final    NO GROWTH 3 DAYS Performed at [facility], [address] [place], [place] [address]    Report Status PENDING  Incomplete  Culture, blood (Routine X 2) w  Reflex to ID Panel     Status: None (Preliminary result)   Collection Time: 05/11/24 10:34 PM   Specimen: BLOOD LEFT HAND  Result Value Ref Range Status   Specimen Description BLOOD LEFT HAND  Final   Special Requests   Final    BOTTLES DRAWN AEROBIC AND ANAEROBIC Blood Culture results may not be optimal due to an inadequate volume of blood received in culture bottles   Culture   Final    NO GROWTH 3 DAYS Performed at [facility], [address] [place], [place] [address]    Report Status PENDING  Incomplete  CSF culture w Gram Stain     Status: None (Preliminary result)   Collection Time: 05/12/24 11:34 AM   Specimen: CSF; Cerebrospinal Fluid  Result Value Ref Range Status   Specimen Description CSF  Final   Special Requests LP  Final   Gram Stain   Final    WBC PRESENT, PREDOMINANTLY MONONUCLEAR NO ORGANISMS SEEN CYTOSPIN SMEAR    Culture   Final    NO GROWTH < 24 HOURS Performed at [facility], [address], [place], [place] [address]    Report Status PENDING  Incomplete  Culture, Respiratory w Gram Stain     Status: None (Preliminary result)   Collection Time: 05/13/24  8:47 AM   Specimen: Tracheal Aspirate; Respiratory  Result Value Ref Range Status   Specimen Description TRACHEAL ASPIRATE  Final   Special Requests NONE  Final   Gram Stain   Final    ABUNDANT WBC PRESENT, PREDOMINANTLY PMN MODERATE GRAM POSITIVE COCCI IN CLUSTERS IN CHAINS FEW GRAM NEGATIVE  RODS Performed at [facility], [address], [place], [place] [address]    Culture PENDING  Incomplete   Report Status PENDING  Incomplete    Corean Fireman, MSN, NP-C [facility] Pager: [phone]  05/14/2024 10:16 AM    Total Encounter Time: 20

## 2024-05-14 NOTE — Progress Notes (Signed)
 Patient having seizure activity when RN came into the room at 2200 for night time medications. Patient was having rhythmic seizures LUE. SBP was in the 100's. 1 mg of Versed  given prn; seizure activity stopped. The patient had 2 IVs and one was leaking and infiltrated. IV pulled, and attempted IV start X2, other RN tried X2 as well, all IVs blown. IV team consulted and came to beside. SBP dropped to 59/48 (53.). Elink MD called on the camera. Orders to start Levophed  and LR bolus at 999. IV team unable to get access. Verbal orders to crank levophed  up to 30mcg/min because BP was not responding. Orders for ground team to come and place central line. ST changes seen on monitor, 12 lead done. Cardiology consulted by CCM. Wife Beth Macdonald updated and consent signed for [employer] insertion. Troponin and other labs sent down.

## 2024-05-14 NOTE — Progress Notes (Signed)
 NAME:  Beth Macdonald, MRN:  [id number], DOB:  May 22, 1946, LOS: 3 ADMISSION DATE:  05/11/2024 CONSULTATION DATE:  05/11/2024 REFERRING MD:  Gennaro - EDP, CHIEF COMPLAINT: Code Stroke, seizures   History of Present Illness:  78 year old woman who presented to [facility] ED 9/8 via EMS as a Code Stroke, concern for seizure-like activity. PMHx significant for HLD, CVA (recent L frontal infarct), TIAs, Bell's palsy, DVT, T2DM c/b neuropathy, SCC (skin). Presented to [facility] earlier on day of admission for low back pain and UTI, prescribed additional-day course of Keflex .   Patient is intubated, therefore history is obtained primarily from chart review and family (wife, [name]) at bedside. Patient's wife states that she had recently been feeling poorly with concern for UTI, states kidneys were a concern due to associated back pain. Presented to [facility] earlier in the day 9/8; wife states she noticed rhythmic L shoulder movement c/f seizure. No history of seizures. This worsened at home and speech became slurred. Patient was then BIB EMS from home with seizure-like activity, weakness and R gaze preference. LKW 1500. NIHSS 38 with symptoms including decreased LOC with disorientation/inability to follow commands, R gaze preference, L facial droop, bilateral UE/LE weakness, global aphasia/dysarthria. Labs were notable for WBC 23.5, Hgb/Plt WNL. INR 1.2. Na 140, K 3.5, CO2 10, BUN/Cr 13/1.50 (baseline 1.3-1.5), LFTs WNL. Ethanol WNL. UA earlier in the day with trace ketones and protein. CT Head was negative for ICH, +chronic small vessel disease with parenchymal volume loss and encephalomalacia of the L frontal lobe/R parietal lobe 2/2 remote infarcts. 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 Neurology was consulted.   Patient was administered Ativan  2mg  and Keppra  load initiated.  Patient mental status declined and she was no longer able to protect her airway; she was subsequently intubated in ED.  PCCM consulted for admission.  Pertinent Medical History:   Past Medical History:  Diagnosis Date   Aortic atherosclerosis (HCC)    Diabetes mellitus without complication (HCC)    Type 2   Diabetic neuropathy (HCC)    DVT of proximal lower limb (HCC)    Facial paralysis/Bells palsy    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HCC)    Hyperlipidemia    Mitral valve insufficiency    Osteoarthritis    SCCA (squamous cell carcinoma) of skin 03/22/2021   Bridge of nose (in situ)   Skull fracture (HCC)    age 78   Squamous cell carcinoma of skin 03/22/2021   Right forehead (in situ)   TIA (transient ischemic attack) 2020   Varicose vein of leg    Significant Hospital Events: Including procedures, antibiotic start and stop dates in addition to other pertinent events   9/8 - Presented to [facility] ED for Code Stroke, seizure-like activity. Previously seen at [facility] ED for UTI, prescribed additional Keflex  x 5-day course. CT Head/CTA Head/Neck as above. Received Ativan /Keppra  load. Required intubation for airway protection. PCCM consulted. 9/9 No seizure activity noted on cEEG. LP performed, low concern for CNS infection based on CSF results.  Interim History / Subjective:   MRI Completed overnight, discussed with Neurology. Concern for infectious vs inflammatory findings. Will consult ID for further infectious evaluation.   Squeezed right hand for RN this AM. Moved both arms last evening spontaneously.   TF started yesterday.   Objective:  Blood pressure (!) 96/56, pulse (!) 103, temperature 98.7 F (37.1  C), temperature source Axillary, resp. rate 16, height 5' 8 (1.727 m), weight 70.9 kg, SpO2 96%.    Vent Mode: PRVC FiO2 (%):  [40 %] 40 % Set Rate:  [16 bmp] 16 bmp Vt Set:  [510 mL] 510 mL PEEP:  [5 cmH20] 5 cmH20 Plateau Pressure:  [15 cmH20-16 cmH20] 15 cmH20    Intake/Output Summary (Last 24 hours) at 05/14/2024 0758 Last data filed at 05/14/2024 0600 Gross per 24 hour  Intake 441.53 ml  Output 505 ml  Net -63.47 ml   Filed Weights   05/11/24 2200 05/12/24 0500 05/13/24 0500  Weight: 69.5 kg 71.6 kg 70.9 kg   Physical Examination: General: Acutely ill-appearing elderly woman, intubated HEENT: [place]/AT, anicteric sclera, PERRL, moist mucous membranes Neuro: Lethargic. Does not withdraw to pain in lower extremities. Cough present with deep sution. Not following commands. No spontaneous movement of extremities on my exam. +Corneal, +Cough, and +Gag  CV: tachycardic, no m/g/r. PULM: clear to auscultation GI: Soft, nontender, nondistended. Normoactive bowel sounds. Extremities: No LE edema noted. Skin: Warm/dry, no rashes.  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 Two clusters of seizure-like activity noted (home, ED). CT Head negative for ICH, +chronic small vessel disease with parenchymal volume loss and encephalomalacia of the L frontal lobe/R parietal lobe 2/2 remote infarcts. 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 MRI 9/10 shows abnormal diffusion signal within the mesial right temporal lobe and hippocampus, with more pronounced T2 and FLAIR signal, could be seizure activity related but neurocontrast enhancement is present and concerning for infectious etiology. - Neuro following, appreciate recommendations - CSF negative for infection at this time - Given concern for infectious etiology on repeat MRI, will consult ID for further input. - AEDs per  Neuro (Keppra , Versed  PRN) - on going encephalopathy due to infectious vs inflammatory etiology vs post-ictal from seizures - check autoimmune panel - Seizure precautions - Frequent neuro checks - Neuroprotective measures: HOB > 30 degrees, normoglycemia, normothermia, electrolytes WNL - PT/OT/SLP when able to participate in care  Acute respiratory insufficiency in the setting of obtundation requiring mechanical ventilation Pneumonia, gram + cocci and gram negative rods on respiratory aspirate - Continue full vent support (4-8cc/kg IBW) - Wean FiO2 for O2 sat > 90% - Shallow breaths on 5/5 PSV - VAP bundle - Pulmonary hygiene - start ceftriaxone  IV 2g daily - follow up tracheal aspirate cultures  HLD Mitral valve insufficiency, mild Recent lipid panel 8/9 with total cholesterol 120, TG 164, HDL/LDL/VLDL WNL. - Resumed ASA/Plavix /statin  - Hold home Repatha  for now - Cardiac monitoring  T2DM Diabetic peripheral neuropathy - SSI - CBGs Q4H - Goal CBG 140-180 - Hold home metformin , semaglutide   Dementia, baseline - Resumed home Aricept   Hypokalemia Hypomagnesemia - replete as needed   Labs:  CBC: Recent Labs  Lab 05/11/24 1030 05/11/24 1800 05/11/24 1805 05/11/24 2211 05/12/24 0554 05/13/24 0825  WBC 13.5* 23.5*  --   --  24.4* 19.9*  NEUTROABS  --  17.8*  --   --   --  17.2*  HGB 14.8 14.2 16.0* 13.3 13.7 13.9  HCT 43.9 45.7 47.0* 39.0 40.3 41.2  MCV 90.5 98.7  --   --  90.0 89.8  PLT 345 389  --   --  355 379   Basic Metabolic Panel: Recent Labs  Lab 05/11/24 1030 05/11/24 1800 05/11/24 1805 05/11/24 2211 05/12/24 0554 05/13/24 0825  NA 139 140 138 137 138 142  K 3.4* 3.5 3.4* 3.2* 3.4* 3.3*  CL 97* 97* 102  --  99 105  CO2 24 10*  --   --  19* 21*  GLUCOSE 113* 248* 233*  --  159* 211*  BUN 13 13 16   --  16 23  CREATININE 0.92 1.50* 1.10*  --  1.17* 1.33*  CALCIUM  9.9 9.5  --   --  9.4 9.4  MG  --   --   --   --  1.5* 2.4  PHOS  --   --   --    --  3.3 2.2*   GFR: Estimated Creatinine Clearance: 34 mL/min (A) (by C-G formula based on SCr of 1.33 mg/dL (H)). Recent Labs  Lab 05/11/24 1030 05/11/24 1800 05/11/24 2234 05/12/24 0554 05/13/24 0825  PROCALCITON  --   --  2.30  --   --   WBC 13.5* 23.5*  --  24.4* 19.9*  LATICACIDVEN  --   --  2.1* 2.1*  --    Liver Function Tests: Recent Labs  Lab 05/11/24 1030 05/11/24 1800  AST 20 37  ALT 13 17  ALKPHOS 58 52  BILITOT 0.7 0.8  PROT 7.6 7.4  ALBUMIN  4.1 3.3*   No results for input(s): LIPASE, AMYLASE in the last 168 hours. No results for input(s): AMMONIA in the last 168 hours.  ABG:    Component Value Date/Time   PHART 7.349 (L) 05/11/2024 2211   PCO2ART 34.4 05/11/2024 2211   PO2ART 574 (H) 05/11/2024 2211   HCO3 19.0 (L) 05/11/2024 2211   TCO2 20 (L) 05/11/2024 2211   ACIDBASEDEF 6.0 (H) 05/11/2024 2211   O2SAT 100 05/11/2024 2211    Coagulation Profile: Recent Labs  Lab 05/11/24 1800  INR 1.2   Cardiac Enzymes: No results for input(s): CKTOTAL, CKMB, CKMBINDEX, TROPONINI in the last 168 hours.  HbA1C: Hgb A1c MFr Bld  Date/Time Value Ref Range Status  03/14/2024 09:36 PM 8.4 (H) 4.8 - 5.6 % Final    Comment:    (NOTE)         Prediabetes: 5.7 - 6.4         Diabetes: >6.4         Glycemic control for adults with diabetes: <7.0   11/15/2022 04:36 AM 6.7 (H) 4.8 - 5.6 % Final    Comment:    (NOTE)         Prediabetes: 5.7 - 6.4         Diabetes: >6.4         Glycemic control for adults with diabetes: <7.0    CBG: Recent Labs  Lab 05/13/24 1212 05/13/24 1916 05/13/24 2307 05/14/24 0319 05/14/24 0735  GLUCAP 188* 211* 251* 106* 171*    Critical care time:    The patient is critically ill with multiple organ system failure and requires high complexity decision making for assessment and support, frequent evaluation and titration of therapies, advanced monitoring, review of radiographic studies and interpretation of  complex data.   Critical Care Time devoted to patient care services, exclusive of separately billable procedures, described in this note is 38 minutes.  Dorn Chill, MD [facility] Office: [phone]   See Amion for personal  pager PCCM on call pager [phone] until 7pm. Please call Elink 7p-7a. [phone]

## 2024-05-14 NOTE — Progress Notes (Deleted)
 Pharmacy Electrolyte Replacement  Recent Labs:  Recent Labs    05/14/24 0817  K 3.2*  MG 2.2  PHOS 3.1  CREATININE 1.53*    Low Critical Values (K </= 2.5, Phos </= 1, Mg </= 1) Present: None  Plan: Give KCL 40 meq per tube x 2 doses.   Javis Abboud, PharmD

## 2024-05-14 NOTE — Progress Notes (Signed)
 Subjective: No acute events overnight.  Slightly more awake today, making eye contact, following commands consistently but does require some repetition.  ROS: Unable to obtain due to poor mental status  Examination  Vital signs in last 24 hours: Temp:  [97.9 F (36.6 C)-99.4 F (37.4 C)] 98.7 F (37.1 C) (09/11 0753) Pulse Rate:  [101-209] 111 (09/11 0800) Resp:  [16-21] 18 (09/11 0800) BP: (88-136)/(56-96) 119/75 (09/11 0800) SpO2:  [91 %-99 %] 96 % (09/11 0800) FiO2 (%):  [40 %] 40 % (09/11 0832)  General: lying in bed, intubated Neuro: Opens eyes to noxious stimulation, looks at examiner, however does not track examiner in room, did raise her right arm and wiggle her toes on command, antigravity strength in right upper extremity, withdraws to noxious stimuli in right lower extremity, did not see any movement in left lower extremity, did withdraw to noxious stimuli in left upper extremity  Basic Metabolic Panel: Recent Labs  Lab 05/11/24 1030 05/11/24 1800 05/11/24 1805 05/11/24 2211 05/12/24 0554 05/13/24 0825 05/14/24 0817  NA 139 140 138 137 138 142 148*  K 3.4* 3.5 3.4* 3.2* 3.4* 3.3* 3.2*  CL 97* 97* 102  --  99 105 110  CO2 24 10*  --   --  19* 21* 23  GLUCOSE 113* 248* 233*  --  159* 211* 181*  BUN 13 13 16   --  16 23 42*  CREATININE 0.92 1.50* 1.10*  --  1.17* 1.33* 1.53*  CALCIUM  9.9 9.5  --   --  9.4 9.4 9.2  MG  --   --   --   --  1.5* 2.4 2.2  PHOS  --   --   --   --  3.3 2.2* 3.1    CBC: Recent Labs  Lab 05/11/24 1030 05/11/24 1800 05/11/24 1805 05/11/24 2211 05/12/24 0554 05/13/24 0825 05/14/24 0817  WBC 13.5* 23.5*  --   --  24.4* 19.9* 17.3*  NEUTROABS  --  17.8*  --   --   --  17.2* 15.2*  HGB 14.8 14.2 16.0* 13.3 13.7 13.9 13.9  HCT 43.9 45.7 47.0* 39.0 40.3 41.2 41.5  MCV 90.5 98.7  --   --  90.0 89.8 91.4  PLT 345 389  --   --  355 379 344     Coagulation Studies: Recent Labs    05/11/24 1800  LABPROT 16.1*  INR 1.2    Imaging  personally reviewed   MRI brain with and without contrast 05/13/2024: Abnormal diffusion trace signal within the mesial right temporal lobe and hippocampus, with more pronounced T2 and FLAIR signal than on the prior scan. The finding is likely related to seizure activity. Neurocontrast enhancement is present to suggest an infectious etiology.  Stable remote cortical infarcts as described above No pathologic enhancement.       ASSESSMENT AND PLAN:78 year old female with recent left frontal infarct, prior multiple strokes, recent UTI on Keflex  who presented with status epilepticus which has since resolved   Focal convulsive status epilepticus, resolved Leukocytosis, improving UTI Acute encephalopathy - Etiology of seizure: Likely in the setting of UTI in a patient with underlying strokes - Patient has had some improvement in mental status.  However still very hard to arouse, requires repetition for following commands and not tracking examiner in the room.  Per ICU team, she does have a pneumonia and is on antibiotics for that as well and.  - On EEG she had LPD's.  However meningitis encephalitis panel  has been negative including HSV.  In the absence of CSF pleocytosis, normal protein and elevated glucose, unlikely that this is infection.  However will request ID consult to confirm -Encephalopathy could have been secondary to UTI as well as pneumonia.  However per MRI report there has been more pronounced degenerative changes on the new MRI rather than.  Therefore we will order some autoimmune workup and consider IV Solu-Medrol  1000 mg for 3 days if cleared by ID - Continue Keppra  500 mg twice daily -I was able to call and update patient's significant other - As needed IV Versed  for clinical seizures - DC LTM EEG - Discussed with ICU team   CRITICAL CARE Performed by: Arlin [name]     Total critical care time: 40 minutes   Critical care time was exclusive of separately billable  procedures and treating other patients.   Critical care was necessary to treat or prevent imminent or life-threatening deterioration.   Critical care was time spent personally by me on the following activities: development of treatment plan with patient and/or surrogate as well as nursing, discussions with consultants, evaluation of patient's response to treatment, examination of patient, obtaining history from patient or surrogate, ordering and performing treatments and interventions, ordering and review of laboratory studies, ordering and review of radiographic studies, pulse oximetry and re-evaluation of patient's condition.    [name] Epilepsy [facility] For questions after 5pm please refer to AMION to reach the Neurologist on call

## 2024-05-14 NOTE — Plan of Care (Signed)
  Problem: Education: Goal: Ability to describe self-care measures that may prevent or decrease complications (Diabetes Survival Skills Education) will improve Outcome: Progressing Goal: Individualized Educational Video(s) Outcome: Progressing   Problem: Coping: Goal: Ability to adjust to condition or change in health will improve Outcome: Progressing   Problem: Fluid Volume: Goal: Ability to maintain a balanced intake and output will improve Outcome: Progressing   Problem: Health Behavior/Discharge Planning: Goal: Ability to identify and utilize available resources and services will improve Outcome: Progressing Goal: Ability to manage health-related needs will improve Outcome: Progressing   Problem: Metabolic: Goal: Ability to maintain appropriate glucose levels will improve Outcome: Progressing   Problem: Nutritional: Goal: Maintenance of adequate nutrition will improve Outcome: Progressing Goal: Progress toward achieving an optimal weight will improve Outcome: Progressing   Problem: Skin Integrity: Goal: Risk for impaired skin integrity will decrease Outcome: Progressing   Problem: Tissue Perfusion: Goal: Adequacy of tissue perfusion will improve Outcome: Progressing   Problem: Education: Goal: Knowledge of General Education information will improve Description: Including pain rating scale, medication(s)/side effects and non-pharmacologic comfort measures Outcome: Progressing   Problem: Health Behavior/Discharge Planning: Goal: Ability to manage health-related needs will improve Outcome: Progressing   Problem: Clinical Measurements: Goal: Ability to maintain clinical measurements within normal limits will improve Outcome: Progressing Goal: Will remain free from infection Outcome: Progressing Goal: Diagnostic test results will improve Outcome: Progressing Goal: Respiratory complications will improve Outcome: Progressing Goal: Cardiovascular complication will  be avoided Outcome: Progressing   Problem: Activity: Goal: Risk for activity intolerance will decrease Outcome: Progressing   Problem: Nutrition: Goal: Adequate nutrition will be maintained Outcome: Progressing   Problem: Coping: Goal: Level of anxiety will decrease Outcome: Progressing   Problem: Elimination: Goal: Will not experience complications related to bowel motility Outcome: Progressing Goal: Will not experience complications related to urinary retention Outcome: Progressing   Problem: Pain Managment: Goal: General experience of comfort will improve and/or be controlled Outcome: Progressing   Problem: Safety: Goal: Ability to remain free from injury will improve Outcome: Progressing   Problem: Skin Integrity: Goal: Risk for impaired skin integrity will decrease Outcome: Progressing   Problem: Education: Goal: Knowledge of disease or condition will improve Outcome: Progressing Goal: Knowledge of secondary prevention will improve (MUST DOCUMENT ALL) Outcome: Progressing Goal: Knowledge of patient specific risk factors will improve (DELETE if not current risk factor) Outcome: Progressing   Problem: Ischemic Stroke/TIA Tissue Perfusion: Goal: Complications of ischemic stroke/TIA will be minimized Outcome: Progressing

## 2024-05-14 NOTE — Progress Notes (Signed)
 Responded to ventilator alarm and observed patient reaching up with right arm and hand (with mitten on) towards ET tube. Then observed rhythmic twitching of left hand and forearm. PRN medication given with relief.

## 2024-05-14 NOTE — Consult Note (Signed)
 Brief Cardiology Consult Note  Asked to see this patients for ST elevations that are new in V1 and V2 this evening.    Strokes in July and again in August of this year, the latter with hemorrhagic conversion.  Admitted this time with seizure activity, intubated for airway protection.  Per RN, had seizure activity earlier this evening and received benzodiazepine, complicated by a profound drop in BP that was resuscitated with IVF and norepinephrine  (currently at 30). Patient intubated and not able to describe symptoms.  Unable to examine patient as she is currently undergoing a sterile procedure.  ECG reviewed and clear ST elevation in V1 and V2, different from her prior ECG. Reviewed with interventionalist on call, Dr. Barbette, who concurred that in an actively seizing patient, with recent strokes, one of which involved hemorrhagic conversation, the risk of a cath outweighs any potential benefit. We would also not be able to confidently use dual antiplatelet therapy given bleeding history.  At this time, Do not recommend heparinization given imaging of head suggests active changes. Echo in the AM can help with cardiac prognostication. BP support as you are doing.  Andee Flatten, MD Cardiology on call.

## 2024-05-14 NOTE — Inpatient Diabetes Management (Signed)
 Inpatient Diabetes Program Recommendations  AACE/ADA: New Consensus Statement on Inpatient Glycemic Control (2015)  Target Ranges:  Prepandial:   less than 140 mg/dL      Peak postprandial:   less than 180 mg/dL (1-2 hours)      Critically ill patients:  140 - 180 mg/dL   Lab Results  Component Value Date   GLUCAP 171 (H) 05/14/2024   HGBA1C 8.4 (H) 03/14/2024    Review of Glycemic Control  Latest Reference Range & Units 05/13/24 07:32 05/13/24 12:12 05/13/24 19:16 05/13/24 23:07 05/14/24 03:19 05/14/24 07:35  Glucose-Capillary 70 - 99 mg/dL 807 (H) 811 (H) 788 (H) 251 (H) 106 (H) 171 (H)   Diabetes history: DM 2 Outpatient Diabetes medications: metformin  1000 mg Daily, Ozempic  1 mg Weekly Current orders for Inpatient glycemic control:  Lantus  5 units Daily (started today) Novolog  0-15 units Q4  Tube Feeds: Kate farms 40 ml/hour  Inpatient Diabetes Program Recommendations:    -   instead of increasing basal insulin  please consider adding Novolog  Tube Feed Coverage Q4 hours as Tube Feed reaches goal rate 50 ml/hour  Thanks,  Clotilda Bull RN, MSN, BC-ADM Inpatient Diabetes Coordinator Team Pager [phone] (8a-5p)

## 2024-05-14 NOTE — Progress Notes (Signed)
 eLink Physician-Brief Progress Note Patient Name: Beth Macdonald DOB: [birth year] MRN: [id number]   Date of Service  05/14/2024  HPI/Events of Note  Patient with sub-optimal sedation on the ventilator.  eICU Interventions  Precedex  gtt ordered.        Marcellina PENNER Yuriana Gaal 05/14/2024, 8:06 PM

## 2024-05-15 ENCOUNTER — Encounter (HOSPITAL_COMMUNITY): Payer: MEDICARE

## 2024-05-15 DIAGNOSIS — R579 Shock, unspecified: Secondary | ICD-10-CM

## 2024-05-15 DIAGNOSIS — G9340 Encephalopathy, unspecified: Secondary | ICD-10-CM | POA: Diagnosis not present

## 2024-05-15 DIAGNOSIS — R9431 Abnormal electrocardiogram [ECG] [EKG]: Secondary | ICD-10-CM | POA: Diagnosis not present

## 2024-05-15 DIAGNOSIS — R569 Unspecified convulsions: Secondary | ICD-10-CM | POA: Diagnosis not present

## 2024-05-15 DIAGNOSIS — R7989 Other specified abnormal findings of blood chemistry: Secondary | ICD-10-CM | POA: Diagnosis not present

## 2024-05-15 DIAGNOSIS — G40109 Localization-related (focal) (partial) symptomatic epilepsy and epileptic syndromes with simple partial seizures, not intractable, without status epilepticus: Secondary | ICD-10-CM | POA: Diagnosis not present

## 2024-05-15 DIAGNOSIS — D72829 Elevated white blood cell count, unspecified: Secondary | ICD-10-CM | POA: Diagnosis not present

## 2024-05-15 DIAGNOSIS — N390 Urinary tract infection, site not specified: Secondary | ICD-10-CM | POA: Diagnosis not present

## 2024-05-15 LAB — CBC WITH DIFFERENTIAL/PLATELET
Abs Immature Granulocytes: 0.11 K/uL — ABNORMAL HIGH (ref 0.00–0.07)
Basophils Absolute: 0 K/uL (ref 0.0–0.1)
Basophils Relative: 0 %
Eosinophils Absolute: 0 K/uL (ref 0.0–0.5)
Eosinophils Relative: 0 %
HCT: 35.7 % — ABNORMAL LOW (ref 36.0–46.0)
Hemoglobin: 11.5 g/dL — ABNORMAL LOW (ref 12.0–15.0)
Immature Granulocytes: 1 %
Lymphocytes Relative: 3 %
Lymphs Abs: 0.5 K/uL — ABNORMAL LOW (ref 0.7–4.0)
MCH: 30.6 pg (ref 26.0–34.0)
MCHC: 32.2 g/dL (ref 30.0–36.0)
MCV: 94.9 fL (ref 80.0–100.0)
Monocytes Absolute: 0.5 K/uL (ref 0.1–1.0)
Monocytes Relative: 3 %
Neutro Abs: 14.7 K/uL — ABNORMAL HIGH (ref 1.7–7.7)
Neutrophils Relative %: 93 %
Platelets: 299 K/uL (ref 150–400)
RBC: 3.76 MIL/uL — ABNORMAL LOW (ref 3.87–5.11)
RDW: 13.9 % (ref 11.5–15.5)
WBC: 15.8 K/uL — ABNORMAL HIGH (ref 4.0–10.5)
nRBC: 0.1 % (ref 0.0–0.2)

## 2024-05-15 LAB — COMPREHENSIVE METABOLIC PANEL WITH GFR
ALT: 77 U/L — ABNORMAL HIGH (ref 0–44)
ALT: 85 U/L — ABNORMAL HIGH (ref 0–44)
AST: 62 U/L — ABNORMAL HIGH (ref 15–41)
AST: 60 U/L — ABNORMAL HIGH (ref 15–41)
Albumin: 2 g/dL — ABNORMAL LOW (ref 3.5–5.0)
Albumin: 2 g/dL — ABNORMAL LOW (ref 3.5–5.0)
Alkaline Phosphatase: 54 U/L (ref 38–126)
Alkaline Phosphatase: 58 U/L (ref 38–126)
Anion gap: 13 (ref 5–15)
Anion gap: 13 (ref 5–15)
BUN: 49 mg/dL — ABNORMAL HIGH (ref 8–23)
BUN: 57 mg/dL — ABNORMAL HIGH (ref 8–23)
CO2: 22 mmol/L (ref 22–32)
CO2: 21 mmol/L — ABNORMAL LOW (ref 22–32)
Calcium: 8.2 mg/dL — ABNORMAL LOW (ref 8.9–10.3)
Calcium: 8 mg/dL — ABNORMAL LOW (ref 8.9–10.3)
Chloride: 107 mmol/L (ref 98–111)
Chloride: 104 mmol/L (ref 98–111)
Creatinine, Ser: 1.68 mg/dL — ABNORMAL HIGH (ref 0.44–1.00)
Creatinine, Ser: 1.69 mg/dL — ABNORMAL HIGH (ref 0.44–1.00)
GFR, Estimated: 31 mL/min — ABNORMAL LOW (ref >60)
GFR, Estimated: 30 mL/min — ABNORMAL LOW (ref >60)
Glucose, Bld: 301 mg/dL — ABNORMAL HIGH (ref 70–99)
Glucose, Bld: 360 mg/dL — ABNORMAL HIGH (ref 70–99)
Potassium: 4.1 mmol/L (ref 3.5–5.1)
Potassium: 4 mmol/L (ref 3.5–5.1)
Sodium: 142 mmol/L (ref 135–145)
Sodium: 138 mmol/L (ref 135–145)
Total Bilirubin: 0.5 mg/dL (ref 0.0–1.2)
Total Bilirubin: 0.4 mg/dL (ref 0.0–1.2)
Total Protein: 5.5 g/dL — ABNORMAL LOW (ref 6.5–8.1)
Total Protein: 5.5 g/dL — ABNORMAL LOW (ref 6.5–8.1)

## 2024-05-15 LAB — TROPONIN I (HIGH SENSITIVITY)
Troponin I (High Sensitivity): 1817 ng/L (ref <18)
Troponin I (High Sensitivity): 1390 ng/L (ref <18)

## 2024-05-15 LAB — ANCA PROFILE
Anti-MPO Antibodies: <0.2 U (ref 0.0–0.9)
Anti-PR3 Antibodies: <0.2 U (ref 0.0–0.9)
Atypical P-ANCA titer: <1:20 {titer}
C-ANCA: <1:20 {titer}
P-ANCA: <1:20 {titer}

## 2024-05-15 LAB — ALDOLASE: Aldolase: 7.7 U/L (ref 3.3–10.3)

## 2024-05-15 LAB — GLUCOSE, CAPILLARY
Glucose-Capillary: 303 mg/dL — ABNORMAL HIGH (ref 70–99)
Glucose-Capillary: 267 mg/dL — ABNORMAL HIGH (ref 70–99)
Glucose-Capillary: 344 mg/dL — ABNORMAL HIGH (ref 70–99)
Glucose-Capillary: 298 mg/dL — ABNORMAL HIGH (ref 70–99)
Glucose-Capillary: 212 mg/dL — ABNORMAL HIGH (ref 70–99)
Glucose-Capillary: 200 mg/dL — ABNORMAL HIGH (ref 70–99)

## 2024-05-15 LAB — CYCLIC CITRUL PEPTIDE ANTIBODY, IGG/IGA: CCP Antibodies IgG/IgA: 9 U (ref 0–19)

## 2024-05-15 LAB — CSF CULTURE W GRAM STAIN: Culture: NO GROWTH

## 2024-05-15 LAB — CBC
HCT: 39.8 % (ref 36.0–46.0)
Hemoglobin: 12.9 g/dL (ref 12.0–15.0)
MCH: 30.7 pg (ref 26.0–34.0)
MCHC: 32.4 g/dL (ref 30.0–36.0)
MCV: 94.8 fL (ref 80.0–100.0)
Platelets: 285 K/uL (ref 150–400)
RBC: 4.2 MIL/uL (ref 3.87–5.11)
RDW: 14.1 % (ref 11.5–15.5)
WBC: 12.8 K/uL — ABNORMAL HIGH (ref 4.0–10.5)
nRBC: 0 % (ref 0.0–0.2)

## 2024-05-15 LAB — LACTIC ACID, PLASMA
Lactic Acid, Venous: 1.9 mmol/L (ref 0.5–1.9)
Lactic Acid, Venous: 2.7 mmol/L (ref 0.5–1.9)

## 2024-05-15 LAB — FANA STAINING PATTERNS: Speckled Pattern: 24529

## 2024-05-15 LAB — C4 COMPLEMENT: Complement C4, Body Fluid: 32 mg/dL (ref 12–38)

## 2024-05-15 LAB — RHEUMATOID FACTOR: Rheumatoid fact SerPl-aCnc: 16 [IU]/mL — ABNORMAL HIGH (ref <14.0)

## 2024-05-15 LAB — TRIGLYCERIDES: Triglycerides: 129 mg/dL (ref <150)

## 2024-05-15 LAB — ANTINUCLEAR ANTIBODIES, IFA: ANA Ab, IFA: POSITIVE — AB

## 2024-05-15 LAB — C3 COMPLEMENT: C3 Complement: 179 mg/dL — ABNORMAL HIGH (ref 82–167)

## 2024-05-15 LAB — ANTI-SCLERODERMA ANTIBODY: Scleroderma (Scl-70) (ENA) Antibody, IgG: <0.2 AI (ref 0.0–0.9)

## 2024-05-15 LAB — MAGNESIUM: Magnesium: 2.1 mg/dL (ref 1.7–2.4)

## 2024-05-15 MED ORDER — INSULIN GLARGINE 100 UNIT/ML ~~LOC~~ SOLN: 10.0000 [IU] | Freq: Every day | SUBCUTANEOUS | Status: DC

## 2024-05-15 MED ORDER — LACTATED RINGERS IV BOLUS
1000.0000 mL | Freq: Once | INTRAVENOUS | Status: AC
  Administered 2024-05-15: 1000 mL via INTRAVENOUS

## 2024-05-15 MED ORDER — INSULIN ASPART 100 UNIT/ML IJ SOLN
6.0000 [IU] | INTRAMUSCULAR | Status: DC
  Administered 2024-05-15 – 2024-05-17 (×11): 6 [IU] via SUBCUTANEOUS

## 2024-05-15 MED ORDER — INSULIN ASPART 100 UNIT/ML IJ SOLN
0.0000 [IU] | INTRAMUSCULAR | Status: DC
  Administered 2024-05-15: 4 [IU] via SUBCUTANEOUS
  Administered 2024-05-15: 20 [IU] via SUBCUTANEOUS
  Administered 2024-05-15: 7 [IU] via SUBCUTANEOUS
  Administered 2024-05-15: 15 [IU] via SUBCUTANEOUS
  Administered 2024-05-16: 4 [IU] via SUBCUTANEOUS
  Administered 2024-05-16 (×2): 7 [IU] via SUBCUTANEOUS
  Administered 2024-05-16 – 2024-05-17 (×3): 4 [IU] via SUBCUTANEOUS
  Administered 2024-05-17: 3 [IU] via SUBCUTANEOUS
  Administered 2024-05-17: 7 [IU] via SUBCUTANEOUS
  Administered 2024-05-17 (×2): 4 [IU] via SUBCUTANEOUS
  Administered 2024-05-18: 3 [IU] via SUBCUTANEOUS
  Administered 2024-05-18: 11 [IU] via SUBCUTANEOUS
  Administered 2024-05-18: 7 [IU] via SUBCUTANEOUS
  Administered 2024-05-19: 3 [IU] via SUBCUTANEOUS
  Administered 2024-05-19: 11 [IU] via SUBCUTANEOUS
  Administered 2024-05-19 – 2024-05-20 (×4): 3 [IU] via SUBCUTANEOUS
  Administered 2024-05-20: 4 [IU] via SUBCUTANEOUS

## 2024-05-15 MED ORDER — INSULIN ASPART 100 UNIT/ML IJ SOLN
3.0000 [IU] | INTRAMUSCULAR | Status: DC
  Administered 2024-05-15: 3 [IU] via SUBCUTANEOUS

## 2024-05-15 MED ORDER — INSULIN GLARGINE 100 UNIT/ML ~~LOC~~ SOLN
15.0000 [IU] | Freq: Every day | SUBCUTANEOUS | Status: DC
  Administered 2024-05-15 – 2024-05-16 (×2): 15 [IU] via SUBCUTANEOUS
  Filled 2024-05-15 (×2): qty 0.15

## 2024-05-15 NOTE — Progress Notes (Signed)
 eLink Physician-Brief Progress Note Patient Name: Beth Macdonald DOB: March 20, 1946 MRN: [id number]   Date of Service  05/15/2024  HPI/Events of Note  Patient needs wrist restraints to prevent self-extubation.  eICU Interventions  Bilateral wrist restraints ordered.        Beth Macdonald 05/15/2024, 11:30 PM

## 2024-05-15 NOTE — Progress Notes (Signed)
 NAME:  Beth Macdonald, MRN:  [id number], DOB:  06-Oct-1945, LOS: 4 ADMISSION DATE:  05/11/2024 CONSULTATION DATE:  05/11/2024 REFERRING MD:  Gennaro - EDP, CHIEF COMPLAINT: Code Stroke, seizures   History of Present Illness:  78 year old woman who presented to [facility] ED 9/8 via EMS as a Code Stroke, concern for seizure-like activity. PMHx significant for HLD, CVA (recent L frontal infarct), TIAs, Bell's palsy, DVT, T2DM c/b neuropathy, SCC (skin). Presented to [facility] earlier on day of admission for low back pain and UTI, prescribed additional-day course of Keflex .   Patient is intubated, therefore history is obtained primarily from chart review and family (wife, [name]) at bedside. Patient's wife states that she had recently been feeling poorly with concern for UTI, states kidneys were a concern due to associated back pain. Presented to [facility] earlier in the day 9/8; wife states she noticed rhythmic L shoulder movement c/f seizure. No history of seizures. This worsened at home and speech became slurred. Patient was then BIB EMS from home with seizure-like activity, weakness and R gaze preference. LKW 1500. NIHSS 38 with symptoms including decreased LOC with disorientation/inability to follow commands, R gaze preference, L facial droop, bilateral UE/LE weakness, global aphasia/dysarthria. Labs were notable for WBC 23.5, Hgb/Plt WNL. INR 1.2. Na 140, K 3.5, CO2 10, BUN/Cr 13/1.50 (baseline 1.3-1.5), LFTs WNL. Ethanol WNL. UA earlier in the day with trace ketones and protein. CT Head was negative for ICH, +chronic small vessel disease with parenchymal volume loss and encephalomalacia of the L frontal lobe/R parietal lobe 2/2 remote infarcts. 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 Neurology was consulted.   Patient was administered Ativan  2mg  and Keppra  load initiated.  Patient mental status declined and she was no longer able to protect her airway; she was subsequently intubated in ED.  PCCM consulted for admission.  Pertinent Medical History:   Past Medical History:  Diagnosis Date   Aortic atherosclerosis (HCC)    Diabetes mellitus without complication (HCC)    Type 2   Diabetic neuropathy (HCC)    DVT of proximal lower limb (HCC)    Facial paralysis/Bells palsy    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HCC)    Hyperlipidemia    Mitral valve insufficiency    Osteoarthritis    SCCA (squamous cell carcinoma) of skin 03/22/2021   Bridge of nose (in situ)   Skull fracture (HCC)    age 74   Squamous cell carcinoma of skin 03/22/2021   Right forehead (in situ)   TIA (transient ischemic attack) 2020   Varicose vein of leg    Significant Hospital Events: Including procedures, antibiotic start and stop dates in addition to other pertinent events   9/8 - Presented to [facility] ED for Code Stroke, seizure-like activity. Previously seen at [facility] ED for UTI, prescribed additional Keflex  x 5-day course. CT Head/CTA Head/Neck as above. Received Ativan /Keppra  load. Required intubation for airway protection. PCCM consulted. 9/9 No seizure activity noted on cEEG. LP performed, low concern for CNS infection based on CSF results. 9/11 seizure activity overnight, started on pulse dose steroids, ID consulted - low concern for infection  Interim History / Subjective:   Started on high dose steroids yesterday for inflammatory concern of her neurologic issues  Overnight, started to have seizure activity. Lost IV access and central line was placed.   Cardiology consulted for ST elevations in V1 and  V2. Did not recommend heparinizing her de to concern of strokes. Echo has been ordered.   Objective:  Blood pressure (!) 88/51, pulse 75, temperature 98.2 F (36.8 C), temperature source Axillary, resp. rate 18, height 5' 8 (1.727 m), weight 70.9 kg, SpO2 99%.    Vent Mode:  PSV;CPAP FiO2 (%):  [40 %] 40 % Set Rate:  [16 bmp] 16 bmp Vt Set:  [510 mL] 510 mL PEEP:  [5 cmH20] 5 cmH20 Pressure Support:  [10 cmH20] 10 cmH20 Plateau Pressure:  [14 cmH20-17 cmH20] 14 cmH20   Intake/Output Summary (Last 24 hours) at 05/15/2024 0840 Last data filed at 05/15/2024 9173 Gross per 24 hour  Intake 5315.25 ml  Output 555 ml  Net 4760.25 ml   Filed Weights   05/11/24 2200 05/12/24 0500 05/13/24 0500  Weight: 69.5 kg 71.6 kg 70.9 kg   Physical Examination: General: Acutely ill-appearing elderly woman, intubated HEENT: [place]/AT, anicteric sclera, PERRL, moist mucous membranes Neuro: Not awake, Pupils pinpoint.  CV: rrr, no m/g/r. PULM: clear to auscultation GI: Soft, nontender, nondistended. Normoactive bowel sounds. Extremities: No LE edema noted. Skin: Warm/dry, no rashes.  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 Two clusters of seizure-like activity noted (home, ED). CT Head negative for ICH, +chronic small vessel disease with parenchymal volume loss and encephalomalacia of the L frontal lobe/R parietal lobe 2/2 remote infarcts. 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 MRI 9/10 shows abnormal diffusion signal within the mesial right temporal lobe and hippocampus, with more pronounced T2 and FLAIR signal, could be seizure activity. - Neuro following, appreciate recommendations - CSF negative for infection at this time. ID consult with low concern for infectious etiology at this time. HIV screen negative. RPR is negative. - AEDs per Neuro (Keppra , Versed  PRN) - on going  encephalopathy due to infectious vs inflammatory etiology vs post-ictal from seizures - autoimmune panel pending - started 1g solumedrol daily for concern of inflammatory etiology - Seizure precautions - Frequent neuro checks - Neuroprotective measures: HOB > 30 degrees, normoglycemia, normothermia, electrolytes WNL - PT/OT/SLP when able to participate in care  Acute respiratory insufficiency in the setting of obtundation requiring mechanical ventilation Pneumonia, gram + cocci and gram negative rods on respiratory aspirate - Continue full vent support (4-8cc/kg IBW) - Wean FiO2 for O2 sat > 90% - Shallow breaths on 5/5 PSV - VAP bundle - Pulmonary hygiene - ceftriaxone  IV 2g daily - follow up tracheal aspirate cultures  Shock - in setting of seizure activity overnight - continue levophed  for MAP 65 or greater  ST Elevations V1 and V2 - cardiology consulted - echo pending  HLD Mitral valve insufficiency, mild Recent lipid panel 8/9 with total cholesterol 120, TG 164, HDL/LDL/VLDL WNL. - Resumed ASA/Plavix /statin  - Hold home Repatha  for now - Cardiac monitoring  T2DM Diabetic peripheral neuropathy - SSI, aggressive scale - 15 units lantus  - 4 units q4 for TF coverage - CBGs Q4H - Goal CBG 140-180 - Hold home metformin , semaglutide   Dementia, baseline - Resumed home Aricept   Hypokalemia Hypomagnesemia - replete as needed   Labs:  CBC: Recent Labs  Lab 05/11/24 1800 05/11/24 1805 05/11/24 2211 05/12/24  9445 05/13/24 0825 05/14/24 0817 05/14/24 2344  WBC 23.5*  --   --  24.4* 19.9* 17.3* 12.8*  NEUTROABS 17.8*  --   --   --  17.2* 15.2*  --   HGB 14.2   < > 13.3 13.7 13.9 13.9 12.9  HCT 45.7   < > 39.0 40.3 41.2 41.5 39.8  MCV 98.7  --   --  90.0 89.8 91.4 94.8  PLT 389  --   --  355 379 344 285   < > = values in this interval not displayed.   Basic Metabolic Panel: Recent Labs  Lab 05/11/24 1800 05/11/24 1805 05/11/24 2211 05/12/24 0554  05/13/24 0825 05/14/24 0817 05/14/24 2344  NA 140 138 137 138 142 148* 142  K 3.5 3.4* 3.2* 3.4* 3.3* 3.2* 4.1  CL 97* 102  --  99 105 110 107  CO2 10*  --   --  19* 21* 23 22  GLUCOSE 248* 233*  --  159* 211* 181* 301*  BUN 13 16  --  16 23 42* 49*  CREATININE 1.50* 1.10*  --  1.17* 1.33* 1.53* 1.68*  CALCIUM  9.5  --   --  9.4 9.4 9.2 8.2*  MG  --   --   --  1.5* 2.4 2.2 2.1  PHOS  --   --   --  3.3 2.2* 3.1  --    GFR: Estimated Creatinine Clearance: 26.9 mL/min (A) (by C-G formula based on SCr of 1.68 mg/dL (H)). Recent Labs  Lab 05/11/24 2234 05/12/24 0554 05/13/24 0825 05/14/24 0817 05/14/24 2341 05/14/24 2344 05/15/24 0512  PROCALCITON 2.30  --   --   --   --   --   --   WBC  --  24.4* 19.9* 17.3*  --  12.8*  --   LATICACIDVEN 2.1* 2.1*  --   --  2.7*  --  1.9   Liver Function Tests: Recent Labs  Lab 05/11/24 1030 05/11/24 1800 05/14/24 2344  AST 20 37 62*  ALT 13 17 77*  ALKPHOS 58 52 54  BILITOT 0.7 0.8 0.5  PROT 7.6 7.4 5.5*  ALBUMIN  4.1 3.3* 2.0*   No results for input(s): LIPASE, AMYLASE in the last 168 hours. No results for input(s): AMMONIA in the last 168 hours.  ABG:    Component Value Date/Time   PHART 7.349 (L) 05/11/2024 2211   PCO2ART 34.4 05/11/2024 2211   PO2ART 574 (H) 05/11/2024 2211   HCO3 19.0 (L) 05/11/2024 2211   TCO2 20 (L) 05/11/2024 2211   ACIDBASEDEF 6.0 (H) 05/11/2024 2211   O2SAT 100 05/11/2024 2211    Coagulation Profile: Recent Labs  Lab 05/11/24 1800  INR 1.2   Cardiac Enzymes: Recent Labs  Lab 05/14/24 0933  CKTOTAL 42    HbA1C: Hgb A1c MFr Bld  Date/Time Value Ref Range Status  03/14/2024 09:36 PM 8.4 (H) 4.8 - 5.6 % Final    Comment:    (NOTE)         Prediabetes: 5.7 - 6.4         Diabetes: >6.4         Glycemic control for adults with diabetes: <7.0   11/15/2022 04:36 AM 6.7 (H) 4.8 - 5.6 % Final    Comment:    (NOTE)         Prediabetes: 5.7 - 6.4         Diabetes: >6.4  Glycemic control for adults with diabetes: <7.0    CBG: Recent Labs  Lab 05/14/24 1808 05/14/24 1922 05/14/24 2317 05/15/24 0330 05/15/24 0733  GLUCAP 225* 268* 287* 303* 267*    Critical care time:    The patient is critically ill with multiple organ system failure and requires high complexity decision making for assessment and support, frequent evaluation and titration of therapies, advanced monitoring, review of radiographic studies and interpretation of complex data.   Critical Care Time devoted to patient care services, exclusive of separately billable procedures, described in this note is 45 minutes.  Dorn Chill, MD [facility] Office: [phone]   See Amion for personal pager PCCM on call pager [phone] until 7pm. Please call Elink 7p-7a. [phone]

## 2024-05-15 NOTE — Progress Notes (Signed)
 Subjective: Has had 2 seizure-like episodes described as jerking in left upper and left lower extremity overnight requiring Versed .  ROS: Unable to obtain due to poor mental status  Examination  Vital signs in last 24 hours: Temp:  [97.5 F (36.4 C)-99.1 F (37.3 C)] 98.2 F (36.8 C) (09/12 0748) Pulse Rate:  [71-116] 89 (09/12 1030) Resp:  [14-29] 26 (09/12 1030) BP: (59-177)/(41-124) 119/69 (09/12 1030) SpO2:  [81 %-100 %] 100 % (09/12 1030) FiO2 (%):  [40 %] 40 % (09/12 0740)  General: lying in bed, NAD Neuro: Received Versed  prior to my exam therefore this could have contributed but patient was more drowsy compared to yesterday, did not open eyes to noxious stimulation, did not follow commands, antigravity strength in both upper extremities today, did not appreciate any withdrawal in bilateral lower extremity  Basic Metabolic Panel: Recent Labs  Lab 05/11/24 1800 05/11/24 1805 05/11/24 2211 05/12/24 0554 05/13/24 0825 05/14/24 0817 05/14/24 2344  NA 140 138 137 138 142 148* 142  K 3.5 3.4* 3.2* 3.4* 3.3* 3.2* 4.1  CL 97* 102  --  99 105 110 107  CO2 10*  --   --  19* 21* 23 22  GLUCOSE 248* 233*  --  159* 211* 181* 301*  BUN 13 16  --  16 23 42* 49*  CREATININE 1.50* 1.10*  --  1.17* 1.33* 1.53* 1.68*  CALCIUM  9.5  --   --  9.4 9.4 9.2 8.2*  MG  --   --   --  1.5* 2.4 2.2 2.1  PHOS  --   --   --  3.3 2.2* 3.1  --     CBC: Recent Labs  Lab 05/11/24 1800 05/11/24 1805 05/11/24 2211 05/12/24 0554 05/13/24 0825 05/14/24 0817 05/14/24 2344  WBC 23.5*  --   --  24.4* 19.9* 17.3* 12.8*  NEUTROABS 17.8*  --   --   --  17.2* 15.2*  --   HGB 14.2   < > 13.3 13.7 13.9 13.9 12.9  HCT 45.7   < > 39.0 40.3 41.2 41.5 39.8  MCV 98.7  --   --  90.0 89.8 91.4 94.8  PLT 389  --   --  355 379 344 285   < > = values in this interval not displayed.     Coagulation Studies: No results for input(s): LABPROT, INR in the last 72 hours.  Imaging No new brain imaging  overnight   ASSESSMENT AND PLAN:78 year old female with recent left frontal infarct, prior multiple strokes, recent UTI on Keflex  who presented with status epilepticus which has since resolved   Focal convulsive status epilepticus, resolved Leukocytosis, improving UTI Acute encephalopathy - Etiology of seizure: Likely in the setting of UTI in a patient with underlying strokes - Patient has had some improvement in mental status.  However still very hard to arouse, requires repetition for following commands and not tracking examiner in the room.  Per ICU team, she does have a pneumonia and is on antibiotics for that as well and.  - Autoimmune workup ordered and pending.  On IV Solu-Medrol  1000 mg D2/3.  Depending on response may consider completing a 5-day course - Continue Keppra  500 mg twice daily for now.  Will also resume video EEG to confirm if patient is having seizures.  If confirmed, will increase Keppra  to 750 mg twice daily - As needed IV Versed  for clinical seizures - Discussed with ICU team   CRITICAL CARE Performed by: Beth Macdonald  Beth Macdonald     Total critical care time: 38 minutes   Critical care time was exclusive of separately billable procedures and treating other patients.   Critical care was necessary to treat or prevent imminent or life-threatening deterioration.   Critical care was time spent personally by me on the following activities: development of treatment plan with patient and/or surrogate as well as nursing, discussions with consultants, evaluation of patient's response to treatment, examination of patient, obtaining history from patient or surrogate, ordering and performing treatments and interventions, ordering and review of laboratory studies, ordering and review of radiographic studies, pulse oximetry and re-evaluation of patient's condition.        Beth Macdonald Epilepsy [facility] For questions after 5pm please refer to AMION to reach the  Neurologist on call

## 2024-05-15 NOTE — Progress Notes (Signed)
 STAT LTM recording with CT compatible leads. [facility] monitoring.

## 2024-05-15 NOTE — Progress Notes (Signed)
 Patient Name: Kiandria Clum Krinsky Date of Encounter: 05/15/2024 [facility] Cardiologist: None   Interval Summary  .    No new events since initial finding of ST elevation on EKG  Vital Signs .    Vitals:   05/15/24 0930 05/15/24 0945 05/15/24 1015 05/15/24 1030  BP: 101/60 (!) 116/95 119/71 119/69  Pulse: 71 88 87 89  Resp: 17 (!) 26 (!) 26 (!) 26  Temp:      TempSrc:      SpO2: 99% 99% 100% 100%  Weight:      Height:        Intake/Output Summary (Last 24 hours) at 05/15/2024 1100 Last data filed at 05/15/2024 1030 Gross per 24 hour  Intake 4934.87 ml  Output 495 ml  Net 4439.87 ml      05/13/2024    5:00 AM 05/12/2024    5:00 AM 05/11/2024   10:00 PM  Last 3 Weights  Weight (lbs) 156 lb 4.9 oz 157 lb 13.6 oz 153 lb 3.5 oz  Weight (kg) 70.9 kg 71.6 kg 69.5 kg      Telemetry/ECG    05/15/2024 - Personally Reviewed No significant arrhythmia  EKG 05/14/2024: Sinus rhythm Anterior ST elevation, consider acute myocardial injury  Echocardiogram 03/2024:  1. Left ventricular ejection fraction, by estimation, is 60 to 65%. Left  ventricular ejection fraction by 3D volume is 61 %. The left ventricle has  normal function. The left ventricle has no regional wall motion  abnormalities. Left ventricular diastolic   parameters are consistent with Grade I diastolic dysfunction (impaired  relaxation).   2. Right ventricular systolic function is normal. The right ventricular  size is normal. There is mildly elevated pulmonary artery systolic  pressure. The estimated right ventricular systolic pressure is 36.4 mmHg.   3. The mitral valve is grossly normal. Trivial mitral valve  regurgitation. No evidence of mitral stenosis.   4. The aortic valve is tricuspid. There is mild calcification of the  aortic valve. Aortic valve regurgitation is not visualized. No aortic  stenosis is present.   5. The inferior vena cava is normal in size with greater than 50%  respiratory  variability, suggesting right atrial pressure of 3 mmHg.   Physical Exam .   Physical Exam Vitals and nursing note reviewed.  Constitutional:      General: She is not in acute distress. Neck:     Vascular: No JVD.  Cardiovascular:     Rate and Rhythm: Normal rate and regular rhythm.     Heart sounds: Normal heart sounds. No murmur heard. Pulmonary:     Effort: Pulmonary effort is normal.     Breath sounds: Normal breath sounds. No wheezing or rales.  Neurological:     Comments: Intubated      Assessment & Plan .     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 New ST elevation on EKG on 05/14/2024, that were not present on EKG on 05/11/2024. Recent stroke, hemorrhagic conversion, seizures could all lead to ST elevation without having STEMI. Modest and downtrending troponin elevation suggests that true STEMI is unlikely. Risks of invasive workup outweigh benefits at this time. She is on Aspirin  and Palvix without any immediate bleeding concerns.  Echocardiogram  pending, but unlikely to change above recommendations.  Continue Crestor. Remains on norepi, hope to be weaned off.   Rest of the management as per Neurology team.  Cardiology will sign off. Please call us  back in case of any questions.   For questions or updates, please contact [facility] Please consult [email or web address] for contact info under        Signed, Newman JINNY Lawrence, MD

## 2024-05-16 ENCOUNTER — Inpatient Hospital Stay (HOSPITAL_COMMUNITY): Payer: MEDICARE

## 2024-05-16 DIAGNOSIS — I5021 Acute systolic (congestive) heart failure: Secondary | ICD-10-CM

## 2024-05-16 DIAGNOSIS — G9340 Encephalopathy, unspecified: Secondary | ICD-10-CM | POA: Diagnosis not present

## 2024-05-16 DIAGNOSIS — G40109 Localization-related (focal) (partial) symptomatic epilepsy and epileptic syndromes with simple partial seizures, not intractable, without status epilepticus: Secondary | ICD-10-CM | POA: Diagnosis not present

## 2024-05-16 DIAGNOSIS — N390 Urinary tract infection, site not specified: Secondary | ICD-10-CM | POA: Diagnosis not present

## 2024-05-16 DIAGNOSIS — R7989 Other specified abnormal findings of blood chemistry: Secondary | ICD-10-CM | POA: Diagnosis not present

## 2024-05-16 DIAGNOSIS — D72829 Elevated white blood cell count, unspecified: Secondary | ICD-10-CM | POA: Diagnosis not present

## 2024-05-16 LAB — CULTURE, RESPIRATORY W GRAM STAIN: Culture: NORMAL

## 2024-05-16 LAB — CBC
HCT: 35.4 % — ABNORMAL LOW (ref 36.0–46.0)
Hemoglobin: 11.7 g/dL — ABNORMAL LOW (ref 12.0–15.0)
MCH: 30.5 pg (ref 26.0–34.0)
MCHC: 33.1 g/dL (ref 30.0–36.0)
MCV: 92.4 fL (ref 80.0–100.0)
Platelets: 283 K/uL (ref 150–400)
RBC: 3.83 MIL/uL — ABNORMAL LOW (ref 3.87–5.11)
RDW: 13.5 % (ref 11.5–15.5)
WBC: 15.4 K/uL — ABNORMAL HIGH (ref 4.0–10.5)
nRBC: 0.1 % (ref 0.0–0.2)

## 2024-05-16 LAB — CULTURE, BLOOD (ROUTINE X 2)
Culture: NO GROWTH
Culture: NO GROWTH

## 2024-05-16 LAB — ECHOCARDIOGRAM LIMITED
Area-P 1/2: 4.89 cm2
Calc EF: 22.2 %
Height: 68 in
S' Lateral: 4 cm
Single Plane A2C EF: 22.5 %
Single Plane A4C EF: 22.6 %
Weight: 2800.72 [oz_av]

## 2024-05-16 LAB — GLUCOSE, CAPILLARY
Glucose-Capillary: 183 mg/dL — ABNORMAL HIGH (ref 70–99)
Glucose-Capillary: 197 mg/dL — ABNORMAL HIGH (ref 70–99)
Glucose-Capillary: 239 mg/dL — ABNORMAL HIGH (ref 70–99)
Glucose-Capillary: 202 mg/dL — ABNORMAL HIGH (ref 70–99)
Glucose-Capillary: 166 mg/dL — ABNORMAL HIGH (ref 70–99)
Glucose-Capillary: 167 mg/dL — ABNORMAL HIGH (ref 70–99)

## 2024-05-16 LAB — BASIC METABOLIC PANEL WITH GFR
Anion gap: 12 (ref 5–15)
BUN: 54 mg/dL — ABNORMAL HIGH (ref 8–23)
CO2: 23 mmol/L (ref 22–32)
Calcium: 8 mg/dL — ABNORMAL LOW (ref 8.9–10.3)
Chloride: 102 mmol/L (ref 98–111)
Creatinine, Ser: 1.43 mg/dL — ABNORMAL HIGH (ref 0.44–1.00)
GFR, Estimated: 37 mL/min — ABNORMAL LOW (ref >60)
Glucose, Bld: 220 mg/dL — ABNORMAL HIGH (ref 70–99)
Potassium: 4.4 mmol/L (ref 3.5–5.1)
Sodium: 137 mmol/L (ref 135–145)

## 2024-05-16 LAB — MAGNESIUM: Magnesium: 2.2 mg/dL (ref 1.7–2.4)

## 2024-05-16 MED ORDER — POLYETHYLENE GLYCOL 3350 17 G PO PACK: 17.0000 g | PACK | Freq: Every day | ORAL | Status: DC

## 2024-05-16 MED ORDER — ACETAMINOPHEN 500 MG PO TABS
1000.0000 mg | ORAL_TABLET | Freq: Three times a day (TID) | ORAL | Status: DC | PRN
  Administered 2024-05-16: 1000 mg
  Filled 2024-05-16: qty 2

## 2024-05-16 MED ORDER — MIDODRINE HCL 5 MG PO TABS
5.0000 mg | ORAL_TABLET | Freq: Three times a day (TID) | ORAL | Status: DC
  Administered 2024-05-16 – 2024-05-17 (×3): 5 mg
  Filled 2024-05-16 (×3): qty 1

## 2024-05-16 MED ORDER — INSULIN GLARGINE 100 UNIT/ML ~~LOC~~ SOLN
15.0000 [IU] | Freq: Two times a day (BID) | SUBCUTANEOUS | Status: DC
  Administered 2024-05-16 – 2024-05-17 (×3): 15 [IU] via SUBCUTANEOUS
  Filled 2024-05-16 (×5): qty 0.15

## 2024-05-16 MED ORDER — PERFLUTREN LIPID MICROSPHERE
1.0000 mL | INTRAVENOUS | Status: AC | PRN
  Administered 2024-05-16: 4 mL via INTRAVENOUS

## 2024-05-16 NOTE — Progress Notes (Signed)
 NAME:  Beth Macdonald, MRN:  [id number], DOB:  08/31/1946, LOS: 5 ADMISSION DATE:  05/11/2024 CONSULTATION DATE:  05/11/2024 REFERRING MD:  Gennaro - EDP, CHIEF COMPLAINT: Code Stroke, seizures   History of Present Illness:  78 year old woman who presented to [facility] ED 9/8 via EMS as a Code Stroke, concern for seizure-like activity. PMHx significant for HLD, CVA (recent L frontal infarct), TIAs, Bell's palsy, DVT, T2DM c/b neuropathy, SCC (skin). Presented to [facility] earlier on day of admission for low back pain and UTI, prescribed additional-day course of Keflex .   Patient is intubated, therefore history is obtained primarily from chart review and family (wife, [name]) at bedside. Patient's wife states that she had recently been feeling poorly with concern for UTI, states kidneys were a concern due to associated back pain. Presented to [facility] earlier in the day 9/8; wife states she noticed rhythmic L shoulder movement c/f seizure. No history of seizures. This worsened at home and speech became slurred. Patient was then BIB EMS from home with seizure-like activity, weakness and R gaze preference. LKW 1500. NIHSS 38 with symptoms including decreased LOC with disorientation/inability to follow commands, R gaze preference, L facial droop, bilateral UE/LE weakness, global aphasia/dysarthria. Labs were notable for WBC 23.5, Hgb/Plt WNL. INR 1.2. Na 140, K 3.5, CO2 10, BUN/Cr 13/1.50 (baseline 1.3-1.5), LFTs WNL. Ethanol WNL. UA earlier in the day with trace ketones and protein. CT Head was negative for ICH, +chronic small vessel disease with parenchymal volume loss and encephalomalacia of the L frontal lobe/R parietal lobe 2/2 remote infarcts. 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 Neurology was consulted.   Patient was administered Ativan  2mg  and Keppra  load initiated.  Patient mental status declined and she was no longer able to protect her airway; she was subsequently intubated in ED.  PCCM consulted for admission.  Pertinent Medical History:   Past Medical History:  Diagnosis Date   Aortic atherosclerosis (HCC)    Diabetes mellitus without complication (HCC)    Type 2   Diabetic neuropathy (HCC)    DVT of proximal lower limb (HCC)    Facial paralysis/Bells palsy    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HCC)    Hyperlipidemia    Mitral valve insufficiency    Osteoarthritis    SCCA (squamous cell carcinoma) of skin 03/22/2021   Bridge of nose (in situ)   Skull fracture (HCC)    age 70   Squamous cell carcinoma of skin 03/22/2021   Right forehead (in situ)   TIA (transient ischemic attack) 2020   Varicose vein of leg    Significant Hospital Events: Including procedures, antibiotic start and stop dates in addition to other pertinent events   9/8 - Presented to [facility] ED for Code Stroke, seizure-like activity. Previously seen at [facility] ED for UTI, prescribed additional Keflex  x 5-day course. CT Head/CTA Head/Neck as above. Received Ativan /Keppra  load. Required intubation for airway protection. PCCM consulted. 9/9 No seizure activity noted on cEEG. LP performed, low concern for CNS infection based on CSF results. 9/11 seizure activity overnight, started on pulse dose steroids, ID consulted - low concern for infection 9/12 cEEG put back in place  Interim History / Subjective:   No seizures noted on cEEG  Patient following commands this morning  Weaned off levo this AM   Objective:  Blood pressure 107/67, pulse 65, temperature (!) 97.5 F (36.4 C),  temperature source Axillary, resp. rate 17, height 5' 8 (1.727 m), weight 79.4 kg, SpO2 98%.    Vent Mode: PRVC FiO2 (%):  [40 %] 40 % Set Rate:  [16 bmp] 16 bmp Vt Set:  [510 mL] 510 mL PEEP:  [5 cmH20] 5 cmH20 Pressure Support:  [10 cmH20] 10 cmH20 Plateau Pressure:  [9 cmH20-15 cmH20] 9 cmH20    Intake/Output Summary (Last 24 hours) at 05/16/2024 0743 Last data filed at 05/16/2024 0700 Gross per 24 hour  Intake 3667.67 ml  Output 1160 ml  Net 2507.67 ml   Filed Weights   05/12/24 0500 05/13/24 0500 05/16/24 0500  Weight: 71.6 kg 70.9 kg 79.4 kg   Physical Examination: General: Acutely ill-appearing elderly woman, intubated HEENT: [place]/AT, anicteric sclera, PERRL, moist mucous membranes Neuro: awake, PERRL, following commands, not moving left extremities CV: rrr, no m/g/r. PULM: clear to auscultation GI: Soft, nontender, nondistended. Normoactive bowel sounds. Extremities: No LE edema noted. Skin: Warm/dry, no rashes.  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 Two clusters of seizure-like activity noted (home, ED). CT Head negative for ICH, +chronic small vessel disease with parenchymal volume loss and encephalomalacia of the L frontal lobe/R parietal lobe 2/2 remote infarcts. 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 MRI 9/10 shows abnormal diffusion signal within the mesial right temporal lobe and hippocampus, with more pronounced T2 and FLAIR signal, could be seizure activity. - Neuro following, appreciate recommendations - CSF negative for infection at this time. ID consult with low concern for infectious etiology at this time. HIV screen negative. RPR is negative. - AEDs per Neuro (Keppra , Versed  PRN) - on going encephalopathy due to infectious vs inflammatory etiology vs post-ictal from seizures - autoimmune panel pending - started 1g solumedrol daily for concern of inflammatory  etiology on 9/10 - Seizure precautions - Frequent neuro checks - Neuroprotective measures: HOB > 30 degrees, normoglycemia, normothermia, electrolytes WNL - PT/OT/SLP when able to participate in care  Acute respiratory insufficiency in the setting of obtundation requiring mechanical ventilation Pneumonia, gram + cocci and gram negative rods on respiratory aspirate - Continue full vent support (4-8cc/kg IBW) - Wean FiO2 for O2 sat > 90% - Tolerating 5/5 but tachypnea present - VAP bundle - Pulmonary hygiene - ceftriaxone  IV 2g daily for 5 days  Shock - in setting of seizure activity overnight - continue levophed  for MAP 65 or greater  ST Elevations V1 and V2 - cardiology consulted - echo pending  HLD Mitral valve insufficiency, mild Recent lipid panel 8/9 with total cholesterol 120, TG 164, HDL/LDL/VLDL WNL. - Resumed ASA/Plavix /statin  - Hold home Repatha  for now - Cardiac monitoring  T2DM Diabetic peripheral neuropathy - SSI, aggressive scale - 15 units lantus  - 4 units q4 for TF coverage - CBGs Q4H - Goal CBG 140-180 - Hold home metformin , semaglutide   Dementia, baseline - Resumed home Aricept   Hypokalemia Hypomagnesemia - replete as needed   Labs:  CBC: Recent Labs  Lab 05/11/24 1800 05/11/24 1805 05/13/24 0825 05/14/24 0817 05/14/24 2344 05/15/24 1056 05/16/24 0537  WBC 23.5*   < > 19.9* 17.3* 12.8* 15.8* 15.4*  NEUTROABS 17.8*  --  17.2* 15.2*  --  14.7*  --   HGB 14.2   < > 13.9 13.9 12.9 11.5* 11.7*  HCT 45.7   < > 41.2 41.5 39.8 35.7* 35.4*  MCV 98.7   < > 89.8 91.4 94.8 94.9 92.4  PLT 389   < > 379 344 285 299 283   < > = values in this interval not displayed.   Basic Metabolic Panel: Recent Labs  Lab 05/12/24 0554 05/13/24 0825 05/14/24 0817 05/14/24 2344 05/15/24 1056 05/16/24 0537  NA 138 142 148* 142 138 137  K 3.4* 3.3* 3.2* 4.1 4.0 4.4  CL 99 105 110 107 104 102  CO2 19* 21* 23 22 21* 23  GLUCOSE 159* 211* 181* 301* 360*  220*  BUN 16 23 42* 49* 57* 54*  CREATININE 1.17* 1.33* 1.53* 1.68* 1.69* 1.43*  CALCIUM  9.4 9.4 9.2 8.2* 8.0* 8.0*  MG 1.5* 2.4 2.2 2.1  --  2.2  PHOS 3.3 2.2* 3.1  --   --   --    GFR: Estimated Creatinine Clearance: 34.7 mL/min (A) (by C-G formula based on SCr of 1.43 mg/dL (H)). Recent Labs  Lab 05/11/24 2234 05/12/24 0554 05/13/24 0825 05/14/24 0817 05/14/24 2341 05/14/24 2344 05/15/24 0512 05/15/24 1056 05/16/24 0537  PROCALCITON 2.30  --   --   --   --   --   --   --   --   WBC  --  24.4*   < > 17.3*  --  12.8*  --  15.8* 15.4*  LATICACIDVEN 2.1* 2.1*  --   --  2.7*  --  1.9  --   --    < > = values in this interval not displayed.   Liver Function Tests: Recent Labs  Lab 05/11/24 1030 05/11/24 1800 05/14/24 2344 05/15/24 1056  AST 20 37 62* 60*  ALT 13 17 77* 85*  ALKPHOS 58 52 54 58  BILITOT 0.7 0.8 0.5 0.4  PROT 7.6 7.4 5.5* 5.5*  ALBUMIN  4.1 3.3* 2.0* 2.0*   No results for input(s): LIPASE, AMYLASE in the last 168 hours. No results for input(s): AMMONIA in the last 168 hours.  ABG:    Component Value Date/Time   PHART 7.349 (L) 05/11/2024 2211   PCO2ART 34.4 05/11/2024 2211   PO2ART 574 (H) 05/11/2024 2211   HCO3 19.0 (L) 05/11/2024 2211   TCO2 20 (L) 05/11/2024 2211   ACIDBASEDEF 6.0 (H) 05/11/2024 2211   O2SAT 100 05/11/2024 2211    Coagulation Profile: Recent Labs  Lab 05/11/24 1800  INR 1.2   Cardiac Enzymes: Recent Labs  Lab 05/14/24 0933  CKTOTAL 42    HbA1C: Hgb A1c MFr Bld  Date/Time Value Ref Range Status  03/14/2024 09:36 PM 8.4 (H) 4.8 - 5.6 % Final    Comment:    (NOTE)         Prediabetes: 5.7 - 6.4         Diabetes: >6.4         Glycemic control for adults with diabetes: <7.0   11/15/2022 04:36 AM 6.7 (H) 4.8 - 5.6 % Final    Comment:    (NOTE)         Prediabetes: 5.7 - 6.4         Diabetes: >6.4         Glycemic control for adults with diabetes: <7.0    CBG: Recent Labs  Lab 05/15/24 1123  05/15/24 1536 05/15/24 1926 05/15/24 2321 05/16/24 0316  GLUCAP 344*  298* 212* 200* 183*    Critical care time:    The patient is critically ill with multiple organ system failure and requires high complexity decision making for assessment and support, frequent evaluation and titration of therapies, advanced monitoring, review of radiographic studies and interpretation of complex data.   Critical Care Time devoted to patient care services, exclusive of separately billable procedures, described in this note is 35 minutes.  Dorn Chill, MD [facility] Office: [phone]   See Amion for personal pager PCCM on call pager [phone] until 7pm. Please call Elink 7p-7a. [phone]

## 2024-05-16 NOTE — Progress Notes (Signed)
 Subjective: No sedation infusing, day 3 of steroids 9/13  Following simple commands  Moves right upper extremities better than left Endorses equal sensation in all extremities   ROS: Shakes head no to questions regarding pain  Examination  Vital signs in last 24 hours: Temp:  [97.4 F (36.3 C)-97.6 F (36.4 C)] 97.5 F (36.4 C) (09/13 0400) Pulse Rate:  [64-110] 73 (09/13 0758) Resp:  [15-30] 19 (09/13 0758) BP: (84-159)/(50-148) 107/67 (09/13 0700) SpO2:  [94 %-100 %] 100 % (09/13 0758) FiO2 (%):  [40 %] 40 % (09/13 0758) Weight:  [79.4 kg] 79.4 kg (09/13 0500)  General: lying in bed, NAD Neuro: Opens eyes to voice, nods appropriately, antigravity strength in both upper extremities today. Drift noted in left upper extremity, and wiggles toes in bilateral lower extremities   Basic Metabolic Panel: Recent Labs  Lab 05/12/24 0554 05/13/24 0825 05/14/24 0817 05/14/24 2344 05/15/24 1056 05/16/24 0537  NA 138 142 148* 142 138 137  K 3.4* 3.3* 3.2* 4.1 4.0 4.4  CL 99 105 110 107 104 102  CO2 19* 21* 23 22 21* 23  GLUCOSE 159* 211* 181* 301* 360* 220*  BUN 16 23 42* 49* 57* 54*  CREATININE 1.17* 1.33* 1.53* 1.68* 1.69* 1.43*  CALCIUM  9.4 9.4 9.2 8.2* 8.0* 8.0*  MG 1.5* 2.4 2.2 2.1  --  2.2  PHOS 3.3 2.2* 3.1  --   --   --     CBC: Recent Labs  Lab 05/11/24 1800 05/11/24 1805 05/13/24 0825 05/14/24 0817 05/14/24 2344 05/15/24 1056 05/16/24 0537  WBC 23.5*   < > 19.9* 17.3* 12.8* 15.8* 15.4*  NEUTROABS 17.8*  --  17.2* 15.2*  --  14.7*  --   HGB 14.2   < > 13.9 13.9 12.9 11.5* 11.7*  HCT 45.7   < > 41.2 41.5 39.8 35.7* 35.4*  MCV 98.7   < > 89.8 91.4 94.8 94.9 92.4  PLT 389   < > 379 344 285 299 283   < > = values in this interval not displayed.     Coagulation Studies: No results for input(s): LABPROT, INR in the last 72 hours.  Imaging No new brain imaging overnight  EEG: This study showed evidence of epileptogenicity and cortical dysfunction  arising from right hemisphere, likely secondary to underlying structural abnormality. Additionally there is a diffuse encephalopathy. No seizures were noted during the study.   ASSESSMENT AND PLAN:78 year old female with recent left frontal infarct, prior multiple strokes, recent UTI on Keflex  who presented with status epilepticus which has since resolved   Focal convulsive status epilepticus, resolved Leukocytosis, improving UTI Acute encephalopathy - Etiology of seizure: Likely in the setting of UTI in a patient with underlying strokes - Patient has had some improvement in mental status.  However still very hard to arouse, requires repetition for following commands and not tracking examiner in the room.  Per ICU team, she does have a pneumonia and is on antibiotics for that as well and.  - Autoimmune workup ordered and pending.  On IV Solu-Medrol  1000 mg D2/3.  Depending on response may consider completing a 5-day course - Continue Keppra  500 mg twice daily for now.  Will also resume video EEG to confirm if patient is having seizures.  If confirmed, will increase Keppra  to 750 mg twice daily - As needed IV Versed  for clinical seizures - Discussed with ICU team  Patient seen and examined by NP/APP with MD. MD to update note as needed.  Jorene Last, DNP, FNP-BC [facility] Pager: [phone]  NEUROHOSPITALIST ADDENDUM Performed a face to face diagnostic evaluation.   I have reviewed the contents of history and physical exam as documented by PA/ARNP/Resident and agree with above documentation.  I have discussed and formulated the above plan as documented. Edits to the note have been made as needed.  Impression/Key exam findings/Plan: improved, following commands on vent. Moves R side more than Left but hen requested will lift LUE off the bed and wiggles toes in LLE. 3rd dose of Solumedrol scheduled for afternoon today.  Bibi Economos, MD [facility] [phone]   If 7pm to 7am, please call on call as listed on AMION.

## 2024-05-16 NOTE — Progress Notes (Signed)
 Echocardiogram pending, will f/u results.  Will repeat EKG today  Lonni LITTIE Nanas, MD

## 2024-05-16 NOTE — Progress Notes (Signed)
 LTM maint complete - no skin breakdown under:  Fp1,Fp2

## 2024-05-16 NOTE — Plan of Care (Signed)
 Patient has remained synchronous with ventilator, follows commands and participates in conversation as able with family, expresses pain and pain management needs

## 2024-05-16 NOTE — Procedures (Signed)
 Patient Name: Beth Macdonald  MRN: [id number]  Epilepsy Attending: Arlin [name]  Referring Physician/Provider: Krebs Arlin MALVA, MD  Duration: 05/15/2024 1229 to 05/16/2024 1229   Patient history: 78 y.o. 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  EEG to evaluate for seizure   Level of alertness: lethargic    AEDs during EEG study:  LEV   Technical aspects: This EEG study was done with scalp electrodes positioned according to the 10-20 International system of electrode placement. Electrical activity was reviewed with band pass filter of 1-70Hz , sensitivity of 7 uV/mm, display speed of 23mm/sec with a 60Hz  notched filter applied as appropriate. EEG data were recorded continuously and digitally stored.  Video monitoring was available and reviewed as appropriate.   Description: EEG showed continuous generalized and lateralized right hemisphere 3 to 6 Hz theta-delta slowing with overriding 13 to 15 Hz beta activity. Sharp waves were noted in right hemisphere. Hyperventilation and photic stimulation were not performed.      ABNORMALITY - Sharp waves, left hemisphere - Continuous slow, generalized and lateralized right hemisphere   IMPRESSION: This study showed evidence of epileptogenicity and cortical dysfunction arising from right hemisphere, likely secondary to underlying structural abnormality.  Additionally there is a diffuse encephalopathy.  No seizures were noted during the study.   Alajia Schmelzer O Lawarence Meek

## 2024-05-16 NOTE — Progress Notes (Signed)
 Echocardiogram 2D Echocardiogram has been performed with image enhancement.  Elohim Brune N Emerita Berkemeier,RDCS 05/16/2024, 2:55 PM

## 2024-05-17 ENCOUNTER — Inpatient Hospital Stay (HOSPITAL_COMMUNITY): Payer: MEDICARE

## 2024-05-17 DIAGNOSIS — R9431 Abnormal electrocardiogram [ECG] [EKG]: Secondary | ICD-10-CM | POA: Diagnosis not present

## 2024-05-17 DIAGNOSIS — I5021 Acute systolic (congestive) heart failure: Secondary | ICD-10-CM | POA: Diagnosis not present

## 2024-05-17 DIAGNOSIS — N390 Urinary tract infection, site not specified: Secondary | ICD-10-CM | POA: Diagnosis not present

## 2024-05-17 DIAGNOSIS — R7989 Other specified abnormal findings of blood chemistry: Secondary | ICD-10-CM

## 2024-05-17 DIAGNOSIS — G9340 Encephalopathy, unspecified: Secondary | ICD-10-CM | POA: Diagnosis not present

## 2024-05-17 DIAGNOSIS — D72829 Elevated white blood cell count, unspecified: Secondary | ICD-10-CM | POA: Diagnosis not present

## 2024-05-17 DIAGNOSIS — G40109 Localization-related (focal) (partial) symptomatic epilepsy and epileptic syndromes with simple partial seizures, not intractable, without status epilepticus: Secondary | ICD-10-CM | POA: Diagnosis not present

## 2024-05-17 LAB — CBC
HCT: 34 % — ABNORMAL LOW (ref 36.0–46.0)
Hemoglobin: 11.2 g/dL — ABNORMAL LOW (ref 12.0–15.0)
MCH: 30.4 pg (ref 26.0–34.0)
MCHC: 32.9 g/dL (ref 30.0–36.0)
MCV: 92.4 fL (ref 80.0–100.0)
Platelets: 288 K/uL (ref 150–400)
RBC: 3.68 MIL/uL — ABNORMAL LOW (ref 3.87–5.11)
RDW: 13.5 % (ref 11.5–15.5)
WBC: 15.2 K/uL — ABNORMAL HIGH (ref 4.0–10.5)
nRBC: 0.1 % (ref 0.0–0.2)

## 2024-05-17 LAB — GLUCOSE, CAPILLARY
Glucose-Capillary: 170 mg/dL — ABNORMAL HIGH (ref 70–99)
Glucose-Capillary: 188 mg/dL — ABNORMAL HIGH (ref 70–99)
Glucose-Capillary: 135 mg/dL — ABNORMAL HIGH (ref 70–99)
Glucose-Capillary: 85 mg/dL (ref 70–99)
Glucose-Capillary: 206 mg/dL — ABNORMAL HIGH (ref 70–99)
Glucose-Capillary: 136 mg/dL — ABNORMAL HIGH (ref 70–99)

## 2024-05-17 LAB — MAGNESIUM: Magnesium: 2.2 mg/dL (ref 1.7–2.4)

## 2024-05-17 LAB — BASIC METABOLIC PANEL WITH GFR
Anion gap: 10 (ref 5–15)
BUN: 54 mg/dL — ABNORMAL HIGH (ref 8–23)
CO2: 24 mmol/L (ref 22–32)
Calcium: 8.1 mg/dL — ABNORMAL LOW (ref 8.9–10.3)
Chloride: 103 mmol/L (ref 98–111)
Creatinine, Ser: 1.25 mg/dL — ABNORMAL HIGH (ref 0.44–1.00)
GFR, Estimated: 44 mL/min — ABNORMAL LOW (ref >60)
Glucose, Bld: 249 mg/dL — ABNORMAL HIGH (ref 70–99)
Potassium: 4.8 mmol/L (ref 3.5–5.1)
Sodium: 137 mmol/L (ref 135–145)

## 2024-05-17 MED ORDER — ORAL CARE MOUTH RINSE: 15.0000 mL | OROMUCOSAL | Status: DC | PRN

## 2024-05-17 MED ORDER — PANTOPRAZOLE SODIUM 40 MG IV SOLR
40.0000 mg | INTRAVENOUS | Status: AC
  Administered 2024-05-17 – 2024-05-18 (×2): 40 mg via INTRAVENOUS
  Filled 2024-05-17 (×2): qty 10

## 2024-05-17 MED ORDER — SODIUM CHLORIDE 0.9 % IV SOLN
1000.0000 mg | INTRAVENOUS | Status: AC
  Administered 2024-05-17 – 2024-05-18 (×2): 1000 mg via INTRAVENOUS
  Filled 2024-05-17 (×2): qty 16

## 2024-05-17 MED ORDER — QUETIAPINE FUMARATE 25 MG PO TABS
25.0000 mg | ORAL_TABLET | Freq: Once | ORAL | Status: AC
  Administered 2024-05-17: 25 mg via ORAL
  Filled 2024-05-17: qty 1

## 2024-05-17 MED ORDER — HALOPERIDOL LACTATE 5 MG/ML IJ SOLN
2.0000 mg | Freq: Four times a day (QID) | INTRAMUSCULAR | Status: DC | PRN
  Administered 2024-05-18: 2 mg via INTRAVENOUS
  Filled 2024-05-17: qty 1

## 2024-05-17 MED ORDER — ORAL CARE MOUTH RINSE
15.0000 mL | OROMUCOSAL | Status: DC
  Administered 2024-05-17 – 2024-05-19 (×6): 15 mL via OROMUCOSAL

## 2024-05-17 NOTE — Evaluation (Addendum)
 Physical Therapy Evaluation Patient Details Name: Beth Macdonald MRN: [id number] DOB: [birth year] Today's Date: 05/17/2024  History of Present Illness  Pt is an 78 y.o. female who presented 05/11/24 with L-sided weakness after d/c'ing home earlier that day from presenting with back pain and UTI. Pt also with seizure activity at home, en route, and in ED. Pt also admitted with Acute systolic heart failure/ST Elevation/Troponin elevation. ETT 9/8 - 9/14. S/p lumbar puncture 9/9. 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   Clinical Impression  Pt presents with condition above and deficits mentioned below, see PT Problem List. PTA, she was mod I using a RW vs cane vs furniture for functional mobility until she recently got weak with a UTI and then she needed assistance to mobilize with a RW. She lives with her wife in a 1-level house with a level entry and has 24/7 support from family and friends. Currently, the pt is displaying deficits in cognition, balance, coordination, strength, power, and activity tolerance. She is at high risk for falls. She also displays motor apraxia along with L-inattention. Her strength in her legs seemed fairly symmetrically weak and she was noted to have decreased sensation at her bil dorsal feet compared to baseline. The pt is currently requiring maxA for all bed mobility and functional transfers. She was unable to safely progress to taking any steps this date. As the pt has had a drastic functional decline, is highly motivated to participate and improve, and has great social support at d/c, she could greatly benefit from intensive inpatient rehab, > 3 hours/day. Will continue to follow acutely.       If plan is discharge home, recommend the following: Two  people to help with walking and/or transfers;Two people to help with bathing/dressing/bathroom;Assistance with cooking/housework;Direct supervision/assist for medications management;Direct supervision/assist for financial management;Assist for transportation;Supervision due to cognitive status   Can travel by private vehicle        Equipment Recommendations Wheelchair (measurements PT);Wheelchair cushion (measurements PT);BSC/3in1;Hospital bed;Hoyer lift (pending progress)  Recommendations for Other Services  Rehab consult;OT consult    Functional Status Assessment Patient has had a recent decline in their functional status and demonstrates the ability to make significant improvements in function in a reasonable and predictable amount of time.     Precautions / Restrictions Precautions Precautions: Fall;Other (comment) Recall of Precautions/Restrictions: Impaired Precaution/Restrictions Comments: L inattention Restrictions Weight Bearing Restrictions Per Provider Order: No      Mobility  Bed Mobility Overal bed mobility: Needs Assistance Bed Mobility: Supine to Sit, Rolling Rolling: Max assist, Used rails   Supine to sit: Max assist, HOB elevated     General bed mobility comments: Pt flexed R leg when cued and reached R UE to therapist's hand then L rail as cued to roll to L 1x, maxA at trunk to roll though. Cued pt to move each leg off L EOB one at a time but pt often moved legs that direction then back to the R, displaying apraxia. Pt needed maxA to manage and keep bil legs off L EOB and to ascend trunk to sit up L EOB.    Transfers Overall transfer level: Needs assistance Equipment used: 1 person hand held assist Transfers: Sit to/from Stand, Bed to chair/wheelchair/BSC Sit to Stand: Max assist Stand pivot transfers: Max assist  General transfer comment: Bil knees blocked and cues pt to hold onto and pull on therapist's trunk to power up to stand, x1 rep from  EOB and x1 rep from recliner. Pt maintained a flexed trunk posture despite max cues to correct. Pt required maxA to transfer to stand and stand pivot to L.    Ambulation/Gait               General Gait Details: unable at this time  Stairs            Wheelchair Mobility     Tilt Bed    Modified Rankin (Stroke Patients Only) Modified Rankin (Stroke Patients Only) Pre-Morbid Rankin Score: Moderate disability Modified Rankin: Severe disability     Balance Overall balance assessment: Needs assistance Sitting-balance support: Bilateral upper extremity supported, Feet supported Sitting balance-Leahy Scale: Poor Sitting balance - Comments: Pt leans laterally to the R, needing max multi-modal cues and min-modA to correct momentarily and to maintain her static sitting balance at EOB. Postural control: Right lateral lean Standing balance support: Bilateral upper extremity supported, During functional activity Standing balance-Leahy Scale: Poor Standing balance comment: reliant on UE support and maxA to stand with pt maintaining a flexed trunk posture                             Pertinent Vitals/Pain Pain Assessment Pain Assessment: Faces Faces Pain Scale: Hurts little more Pain Location: generalized Pain Descriptors / Indicators: Discomfort, Grimacing, Guarding Pain Intervention(s): Limited activity within patient's tolerance, Monitored during session, Repositioned    Home Living Family/patient expects to be discharged to:: Private residence Living Arrangements: Spouse/significant other Available Help at Discharge: Family;Available 24 hours/day;Friend(s) Type of Home: House Home Access: Level entry       Home Layout: One level Home Equipment: Cane - single Librarian, academic (2 wheels);Shower seat;Grab bars - tub/shower;Toilet riser      Prior Function Prior Level of Function : Needs assist             Mobility Comments: In last week, pt has  needed more assist of RW and +1 assist for mobility when she had a UTI. Prior to that pt was mod I using cane vs RW vs furniture surfs ADLs Comments: Wife assisting with dressing and bathing since recent strokes     Extremity/Trunk Assessment   Upper Extremity Assessment Upper Extremity Assessment: Defer to OT evaluation    Lower Extremity Assessment Lower Extremity Assessment: Generalized weakness;RLE deficits/detail;LLE deficits/detail RLE Deficits / Details: difficult to fully assess due to cognitive deficits and poor command following along with L inattention, but grossly symmetrical in strength bil with gross MMT scores of 3+ to 4-; denied feeling light touch at dorsal feet bil but detected touch at lower legs, wife reports hx of plantar feet sensation deficits but not dorsal deficits in sensation RLE Sensation: decreased light touch RLE Coordination: decreased fine motor;decreased gross motor LLE Deficits / Details: difficult to fully assess due to cognitive deficits and poor command following along with L inattention, but grossly symmetrical in strength bil with gross MMT scores of 3+ to 4-; denied feeling light touch at dorsal feet bil but detected touch at lower legs, wife reports hx of plantar feet sensation deficits but not dorsal deficits in sensation LLE Sensation: decreased light touch LLE Coordination: decreased gross motor;decreased fine motor       Communication   Communication Communication: Impaired Factors Affecting Communication: Reduced clarity of speech  Cognition Arousal: Alert Behavior During Therapy: WFL for tasks assessed/performed   PT - Cognitive impairments: Awareness, Attention, Initiation, Memory, Sequencing, Problem solving, Safety/Judgement                       PT - Cognition Comments: Pt with decreased attention to L side, favoring leaning to R and moving R extremities when cued to move L. Pt slow to process cues and initiate mobility,  displaying motor apraxia often moving the opposite direction that directed. Pt needed cues to sequence and maintain safety with all mobility. Pt unaware of her safety, trying to pivot her L buttocks off EOB often to turn to the R while sitting EOB, needing cues to face forward instead. Pt follows simple multi-modal cues ~50% of the time Following commands: Impaired Following commands impaired: Follows one step commands inconsistently, Follows one step commands with increased time     Cueing Cueing Techniques: Verbal cues, Gestural cues, Tactile cues, Visual cues     General Comments General comments (skin integrity, edema, etc.): VSS on RA, on 2L start and end of session; encouraged family to sit to pt's L and try to improve L attention    Exercises     Assessment/Plan    PT Assessment Patient needs continued PT services  PT Problem List Decreased strength;Decreased activity tolerance;Decreased balance;Decreased mobility;Decreased coordination;Decreased cognition;Decreased knowledge of use of DME;Decreased safety awareness;Impaired sensation       PT Treatment Interventions DME instruction;Gait training;Functional mobility training;Therapeutic activities;Therapeutic exercise;Balance training;Neuromuscular re-education;Cognitive remediation;Patient/family education;Wheelchair mobility training    PT Goals (Current goals can be found in the Care Plan section)  Acute Rehab PT Goals Patient Stated Goal: to improve PT Goal Formulation: With patient/family Time For Goal Achievement: 05/31/24 Potential to Achieve Goals: Good    Frequency Min 3X/week     Co-evaluation               AM-PAC PT 6 Clicks Mobility  Outcome Measure Help needed turning from your back to your side while in a flat bed without using bedrails?: A Lot Help needed moving from lying on your back to sitting on the side of a flat bed without using bedrails?: A Lot Help needed moving to and from a bed to a  chair (including a wheelchair)?: A Lot Help needed standing up from a chair using your arms (e.g., wheelchair or bedside chair)?: A Lot Help needed to walk in hospital room?: Total Help needed climbing 3-5 steps with a railing? : Total 6 Click Score: 10    End of Session Equipment Utilized During Treatment: Gait belt;Oxygen Activity Tolerance: Patient tolerated treatment well Patient left: in chair;with call bell/phone within reach;with chair alarm set;with family/visitor present Nurse Communication: Mobility status;Need for lift equipment PT Visit Diagnosis: Unsteadiness on feet (R26.81);Other abnormalities of gait and mobility (R26.89);Muscle weakness (generalized) (M62.81);Difficulty in walking, not elsewhere classified (R26.2);Other symptoms and signs involving the nervous system (R29.898)    Time: 8441-8361 PT Time Calculation (min) (ACUTE ONLY): 40 min   Charges:   PT Evaluation $PT Eval Moderate Complexity: 1 Mod PT Treatments $Therapeutic Activity: 23-37 mins PT General Charges $$ ACUTE PT VISIT: 1 Visit         Theo Ferretti, PT, DPT Acute Rehabilitation Services  Office: [phone]   Theo CHRISTELLA Ferretti 05/17/2024, 5:03 PM

## 2024-05-17 NOTE — Procedures (Addendum)
 Patient Name: Beth Macdonald  MRN: [id number]  Epilepsy Attending: Arlin [name]  Referring Physician/Provider: Krebs Arlin MALVA, MD  Duration: 05/16/2024 1229 to 05/17/2024 1025   Patient history: 78 y.o. 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  EEG to evaluate for seizure   Level of alertness: lethargic    AEDs during EEG study:  LEV   Technical aspects: This EEG study was done with scalp electrodes positioned according to the 10-20 International system of electrode placement. Electrical activity was reviewed with band pass filter of 1-70Hz , sensitivity of 7 uV/mm, display speed of 49mm/sec with a 60Hz  notched filter applied as appropriate. EEG data were recorded continuously and digitally stored.  Video monitoring was available and reviewed as appropriate.   Description: EEG showed continuous generalized and lateralized right hemisphere 3 to 6 Hz theta-delta slowing. Hyperventilation and photic stimulation were not performed.      ABNORMALITY - Continuous slow, generalized and lateralized right hemisphere   IMPRESSION: This study showed evidence of cortical dysfunction arising from right hemisphere, likely secondary to underlying structural abnormality. Additionally there is a diffuse encephalopathy.  No seizures were noted during the study.   [name]

## 2024-05-17 NOTE — Progress Notes (Signed)
 Rounding Note   Patient Name: Beth Macdonald Date of Encounter: 05/17/2024  [facility] Cardiologist: None   Subjective Extubated this morning.  Denies any chest pain or dyspnea  Scheduled Meds:  aspirin   81 mg Per Tube Daily   atorvastatin   20 mg Per Tube Daily   Chlorhexidine  Gluconate Cloth  6 each Topical Daily   clopidogrel   75 mg Per Tube Daily   free water   300 mL Per Tube Q4H   heparin  injection (subcutaneous)  5,000 Units Subcutaneous Q8H   insulin  aspart  0-20 Units Subcutaneous Q4H   insulin  glargine  15 Units Subcutaneous BID   levETIRAcetam   500 mg Intravenous Q12H   midodrine   5 mg Per Tube Q8H   multivitamin with minerals  1 tablet Per Tube Daily   nystatin    Topical BID   mouth rinse  15 mL Mouth Rinse Q2H   pantoprazole  (PROTONIX ) IV  40 mg Intravenous Q24H   polyethylene glycol  17 g Per Tube Daily   thiamine   100 mg Per Tube Daily   Continuous Infusions:  cefTRIAXone  (ROCEPHIN )  IV 200 mL/hr at 05/17/24 1000   feeding supplement (KATE FARMS STANDARD ENT 1.4) Stopped (05/17/24 1008)   methylPREDNISolone  (SOLU-MEDROL ) injection     norepinephrine  (LEVOPHED ) Adult infusion Stopped (05/16/24 0814)   PRN Meds: acetaminophen , fentaNYL  (SUBLIMAZE ) injection, midazolam , ondansetron  (ZOFRAN ) IV, mouth rinse   Vital Signs  Vitals:   05/17/24 0930 05/17/24 0945 05/17/24 0949 05/17/24 1000  BP: 121/67   118/65  Pulse: 71 71 76 69  Resp: 18 19 17 20   Temp:      TempSrc:      SpO2: 100% 100% 99% 99%  Weight:      Height:        Intake/Output Summary (Last 24 hours) at 05/17/2024 1032 Last data filed at 05/17/2024 1000 Gross per 24 hour  Intake 3404.09 ml  Output 900 ml  Net 2504.09 ml      05/17/2024    4:40 AM 05/16/2024    5:00 AM 05/13/2024    5:00 AM  Last 3 Weights  Weight (lbs) 170 lb 3.1 oz 175 lb 0.7 oz 156 lb 4.9 oz  Weight (kg) 77.2 kg 79.4 kg 70.9 kg      Telemetry NSR - Personally Reviewed  ECG  Improvement in ST  elevation in V1/2 - Personally Reviewed  Physical Exam  GEN: Chronically ill appearing Neck: No JVD Cardiac: RRR, no murmurs, rubs, or gallops.  Respiratory: Clear to auscultation bilaterally. GI: Soft, nontender, non-distended  MS: No edema; No deformity. Neuro:  Nonfocal  Psych: Normal affect   Labs High Sensitivity Troponin:   Recent Labs  Lab 05/14/24 2344 05/15/24 0512  TROPONINIHS 1,817* 1,390*     Chemistry Recent Labs  Lab 05/11/24 1800 05/11/24 1805 05/14/24 2344 05/15/24 1056 05/16/24 0537 05/17/24 0440  NA 140   < > 142 138 137 137  K 3.5   < > 4.1 4.0 4.4 4.8  CL 97*   < > 107 104 102 103  CO2 10*   < > 22 21* 23 24  GLUCOSE 248*   < > 301* 360* 220* 249*  BUN 13   < > 49* 57* 54* 54*  CREATININE 1.50*   < > 1.68* 1.69* 1.43* 1.25*  CALCIUM  9.5   < > 8.2* 8.0* 8.0* 8.1*  MG  --    < > 2.1  --  2.2 2.2  PROT 7.4  --  5.5* 5.5*  --   --   ALBUMIN  3.3*  --  2.0* 2.0*  --   --   AST 37  --  62* 60*  --   --   ALT 17  --  77* 85*  --   --   ALKPHOS 52  --  54 58  --   --   BILITOT 0.8  --  0.5 0.4  --   --   GFRNONAA 35*   < > 31* 30* 37* 44*  ANIONGAP 33*   < > 13 13 12 10    < > = values in this interval not displayed.    Lipids  Recent Labs  Lab 05/15/24 0512  TRIG 129    Hematology Recent Labs  Lab 05/15/24 1056 05/16/24 0537 05/17/24 0440  WBC 15.8* 15.4* 15.2*  RBC 3.76* 3.83* 3.68*  HGB 11.5* 11.7* 11.2*  HCT 35.7* 35.4* 34.0*  MCV 94.9 92.4 92.4  MCH 30.6 30.5 30.4  MCHC 32.2 33.1 32.9  RDW 13.9 13.5 13.5  PLT 299 283 288   Thyroid  No results for input(s): TSH, FREET4 in the last 168 hours.  BNPNo results for input(s): BNP, PROBNP in the last 168 hours.  DDimer No results for input(s): DDIMER in the last 168 hours.   Radiology  ECHOCARDIOGRAM LIMITED Result Date: 05/16/2024    ECHOCARDIOGRAM LIMITED REPORT   Patient Name:   Beth Macdonald Date of Exam: 05/16/2024 Medical Rec #:  [id number]         Height:       68.0  in Accession #:    [id number]        Weight:       175.0 lb Date of Birth:  12-23-1945         BSA:          1.931 m Patient Age:    78 years          BP:           105/56 mmHg Patient Gender: F                 HR:           83 bpm. Exam Location:  Inpatient Procedure: Limited Echo, Cardiac Doppler, Limited Color Doppler and Intracardiac            Opacification Agent (Both Spectral and Color Flow Doppler were            utilized during procedure). Indications:    Elevated Troponin  History:        Patient has prior history of Echocardiogram examinations, most                 recent 03/15/2024. DVT and TIA; Risk Factors:Dyslipidemia,                 Current Smoker and Diabetes.  Sonographer:    Logan Shove RDCS Referring Phys: [id number] LONNI CROME [facility]  Sonographer Comments: Technically challenging study due to limited acoustic windows. IMPRESSIONS  1. Limited Echocardiogram - see study dated 03/15/2024 for additional details.  2. Left ventricular ejection fraction, by estimation, is 25 to 30%. The left ventricle has severely decreased function. The left ventricle demonstrates regional wall motion abnormalities (see scoring diagram/findings for description). The anterior wall and entire septum are akinetic. The entire lateral wall, entire inferior wall, apical anterior segment, and apex are hypokinetic.  3. Right ventricular systolic function is normal. The right ventricular size is  normal.  4. The mitral valve is degenerative. Moderate mitral valve regurgitation. Comparison(s): A prior study was performed on 03/15/2024. LVEF 60-65%, no RWMA, G1DD, mild PASP , RAP . Change in LVEF and RWMA are new comapred to prior study. FINDINGS  Left Ventricle: Left ventricular ejection fraction, by estimation, is 25 to 30%. The left ventricle has severely decreased function. The left ventricle demonstrates regional wall motion abnormalities. The left ventricular internal cavity size was normal  in size. There is no  left ventricular hypertrophy.  LV Wall Scoring: The anterior wall and entire septum are akinetic. The entire lateral wall, entire inferior wall, apical anterior segment, and apex are hypokinetic. The anterior wall and entire septum are akinetic. The entire lateral wall, entire inferior wall, apical anterior segment, and apex are hypokinetic. Right Ventricle: The right ventricular size is normal. Right ventricular systolic function is normal. Left Atrium: Left atrial size was normal in size. Right Atrium: Right atrial size was normal in size. Mitral Valve: The mitral valve is degenerative in appearance. Moderate mitral valve regurgitation. Tricuspid Valve: The tricuspid valve is grossly normal. Tricuspid valve regurgitation is mild . No evidence of tricuspid stenosis. Aorta: The aortic root is normal in size and structure. Venous: IVC assessment for right atrial pressure unable to be performed due to mechanical ventilation. IAS/Shunts: The interatrial septum was not well visualized. LEFT VENTRICLE PLAX 2D LVIDd:         4.40 cm      Diastology LVIDs:         4.00 cm      LV e' medial:    5.77 cm/s LV PW:         0.90 cm      LV E/e' medial:  9.2 LV IVS:        1.20 cm      LV e' lateral:   7.83 cm/s                             LV E/e' lateral: 6.8  LV Volumes (MOD) LV vol d, MOD A2C: 142.0 ml LV vol d, MOD A4C: 177.0 ml LV vol s, MOD A2C: 110.0 ml LV vol s, MOD A4C: 137.0 ml LV SV MOD A2C:     32.0 ml LV SV MOD A4C:     177.0 ml LV SV MOD BP:      35.5 ml RIGHT VENTRICLE          IVC RV Basal diam:  2.60 cm  IVC diam: 1.80 cm LEFT ATRIUM             Index        RIGHT ATRIUM          Index LA diam:        2.40 cm 1.24 cm/m   RA Area:     6.78 cm LA Vol (A2C):   76.7 ml 39.72 ml/m  RA Volume:   10.10 ml 5.23 ml/m LA Vol (A4C):   37.8 ml 19.57 ml/m LA Biplane Vol: 55.9 ml 28.95 ml/m   AORTA Ao Root diam: 2.20 cm MITRAL VALVE MV Area (PHT): 4.89 cm MV Decel Time: 155 msec MV E velocity: 52.90 cm/s MV A velocity:  81.80 cm/s MV E/A ratio:  0.65 Sunit Tolia Electronically signed by Madonna Large Signature Date/Time: 05/16/2024/3:59:38 PM    Final    Overnight EEG with video Result Date: 05/16/2024 Shelton Arlin KIDD, MD  05/17/2024  7:12 AM Patient Name: Beth Macdonald MRN: [id number] Epilepsy Attending: Arlin [name] Referring Physician/Provider: Krebs Arlin MALVA, MD Duration: 05/15/2024 1229 to 05/16/2024 1229  Patient history: 77 y.o. 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  EEG to evaluate for seizure  Level of alertness: lethargic  AEDs during EEG study:  LEV  Technical aspects: This EEG study was done with scalp electrodes positioned according to the 10-20 International system of electrode placement. Electrical activity was reviewed with band pass filter of 1-70Hz , sensitivity of 7 uV/mm, display speed of 87mm/sec with a 60Hz  notched filter applied as appropriate. EEG data were recorded continuously and digitally stored.  Video monitoring was available and reviewed as appropriate.  Description: EEG showed continuous generalized and lateralized right hemisphere 3 to 6 Hz theta-delta slowing with overriding 13 to 15 Hz beta activity. Sharp waves were noted in right hemisphere. Hyperventilation and photic stimulation were not performed.    ABNORMALITY - Sharp waves, left hemisphere - Continuous slow, generalized and lateralized right hemisphere  IMPRESSION: This study showed evidence of epileptogenicity and cortical dysfunction arising from right hemisphere, likely secondary to underlying structural abnormality.  Additionally there is a diffuse encephalopathy.  No seizures were noted during the study.  Arlin [name]    Cardiac Studies   Patient Profile   78 y.o. 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  Downtrending troponin ([id number]).  Cardiology was consulted but given recent CVA with hemorrhagic conversion and presentation with status epilepticus, felt that risk of invasive workup outweigh benefits and did not recommend heart catheterization  - Echocardiogram yesterday shows severe systolic heart failure.  Differential includes LAD infarct versus Takotsubo cardiomyopathy.  Ideally would proceed with cardiac catheterization to rule out LAD infarct but complicated situation given her recent CVA with hemorrhagic conversion.  Takotsubo can also cause ST elevations and given relatively modest troponin elevation that was downtrending at time of ST elevations, argues more for Takotsubos rather than LAD territory infarct.  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 Would recommend checking cardiac MRI to evaluate for LAD infarct versus Takotsubos; she is still somewhat confused and I do not think she would be a good candidate for CMR right now as will need to cooperate with breath holds for the exam, but may be to obtain CMR in next few days as she recovers -Can add GDMT as tolerated  CVA/seizures: Neurology following.  On IV Solu-Medrol , Keppra   Acute respiratory failure: Intubated due to obtundation.  PCCM managing, extubated today  For questions or updates, please contact [facility] Please consult [email or web address] for contact info under       Signed, Lonni LITTIE Nanas, MD  05/17/2024, 10:32 AM

## 2024-05-17 NOTE — Progress Notes (Signed)
 Subjective:  Extubate today. Steroids for 5 days.  Following simple commands  Moves right upper extremities better than left Endorses equal sensation in all extremities   ROS: Shakes head no to questions regarding pain  Examination  Vital signs in last 24 hours: Temp:  [97.5 F (36.4 C)-98.1 F (36.7 C)] 97.5 F (36.4 C) (09/14 0757) Pulse Rate:  [57-94] 69 (09/14 1000) Resp:  [13-26] 20 (09/14 1000) BP: (103-135)/(54-91) 118/65 (09/14 1000) SpO2:  [98 %-100 %] 99 % (09/14 1000) FiO2 (%):  [40 %] 40 % (09/14 0710) Weight:  [77.2 kg] 77.2 kg (09/14 0440)  General: lying in bed, NAD Neuro: Opens eyes to voice, nods appropriately, antigravity strength in both upper extremities today. Drift noted in left upper extremity, and wiggles toes in bilateral lower extremities   Basic Metabolic Panel: Recent Labs  Lab 05/12/24 0554 05/13/24 0825 05/14/24 0817 05/14/24 2344 05/15/24 1056 05/16/24 0537 05/17/24 0440  NA 138 142 148* 142 138 137 137  K 3.4* 3.3* 3.2* 4.1 4.0 4.4 4.8  CL 99 105 110 107 104 102 103  CO2 19* 21* 23 22 21* 23 24  GLUCOSE 159* 211* 181* 301* 360* 220* 249*  BUN 16 23 42* 49* 57* 54* 54*  CREATININE 1.17* 1.33* 1.53* 1.68* 1.69* 1.43* 1.25*  CALCIUM  9.4 9.4 9.2 8.2* 8.0* 8.0* 8.1*  MG 1.5* 2.4 2.2 2.1  --  2.2 2.2  PHOS 3.3 2.2* 3.1  --   --   --   --     CBC: Recent Labs  Lab 05/11/24 1800 05/11/24 1805 05/13/24 0825 05/14/24 0817 05/14/24 2344 05/15/24 1056 05/16/24 0537 05/17/24 0440  WBC 23.5*   < > 19.9* 17.3* 12.8* 15.8* 15.4* 15.2*  NEUTROABS 17.8*  --  17.2* 15.2*  --  14.7*  --   --   HGB 14.2   < > 13.9 13.9 12.9 11.5* 11.7* 11.2*  HCT 45.7   < > 41.2 41.5 39.8 35.7* 35.4* 34.0*  MCV 98.7   < > 89.8 91.4 94.8 94.9 92.4 92.4  PLT 389   < > 379 344 285 299 283 288   < > = values in this interval not displayed.     Coagulation Studies: No results for input(s): LABPROT, INR in the last 72 hours.  Imaging No new brain  imaging overnight  EEG: This study showed evidence of epileptogenicity and cortical dysfunction arising from right hemisphere, likely secondary to underlying structural abnormality. Additionally there is a diffuse encephalopathy. No seizures were noted during the study.   ASSESSMENT AND PLAN:78 year old female with recent left frontal infarct, prior multiple strokes, recent UTI on Keflex  who presented with status epilepticus which has since resolved   Focal convulsive status epilepticus, resolved Leukocytosis, improving UTI Acute encephalopathy - Etiology of seizure: Likely in the setting of UTI in a patient with underlying strokes - Patient has had some improvement in mental status.  However still very hard to arouse, requires repetition for following commands and not tracking examiner in the room.  Per ICU team, she does have a pneumonia and is on antibiotics for that as well and.  - Autoimmune workup ordered and pending.  On IV Solu-Medrol  1000 mg D2/3.  Depending on response may consider completing a 5-day course - D/c LTM  - Continue Keppra  500 mg twice daily for now.  Will also resume video EEG to confirm if patient is having seizures.  If confirmed, will increase Keppra  to 750 mg twice daily -  As needed IV Versed  for clinical seizures - Discussed with ICU team  Patient seen and examined by NP/APP with MD. MD to update note as needed.   Jorene Last, DNP, FNP-BC [facility] Pager: [phone]   NEUROHOSPITALIST ADDENDUM Performed a face to face diagnostic evaluation.   I have reviewed the contents of history and physical exam as documented by PA/ARNP/Resident and agree with above documentation.  I have discussed and formulated the above plan as documented. Edits to the note have been made as needed.  Impression/Key exam findings/Plan: started on precedex  but still following commands. Discussed with PCCM team, plan to extubate today. Will do 2 additional days of  IVMP given significant improvement. PPI while on steroids.  Continue Keppra  500mg  BID Discontinue LTM EEG. Autoimmune workup is pending. ANA IFA positive.  Malayia Spizzirri, MD [facility] [phone]   If 7pm to 7am, please call on call as listed on AMION.

## 2024-05-17 NOTE — Evaluation (Signed)
 Clinical/Bedside Swallow Evaluation Patient Details  Name: Beth Macdonald MRN: [id number] Date of Birth: 1945-09-17  Today's Date: 05/17/2024 Time: SLP Start Time (ACUTE ONLY): 1449 SLP Stop Time (ACUTE ONLY): 1510 SLP Time Calculation (min) (ACUTE ONLY): 21 min  Past Medical History:  Past Medical History:  Diagnosis Date   Aortic atherosclerosis (HCC)    Diabetes mellitus without complication (HCC)    Type 2   Diabetic neuropathy (HCC)    DVT of proximal lower limb (HCC)    Facial paralysis/Bells palsy    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HCC)    Hyperlipidemia    Mitral valve insufficiency    Osteoarthritis    SCCA (squamous cell carcinoma) of skin 03/22/2021   Bridge of nose (in situ)   Skull fracture (HCC)    age 78   Squamous cell carcinoma of skin 03/22/2021   Right forehead (in situ)   TIA (transient ischemic attack) 2020   Varicose vein of leg    Past Surgical History:  Past Surgical History:  Procedure Laterality Date   ELBOW SURGERY Bilateral    20 yrs ago   ENDOVENOUS ABLATION SAPHENOUS VEIN W/ LASER Bilateral    ENDOVENOUS ABLATION SAPHENOUS VEIN W/ LASER Left 11/19/2019   endovenous laser ablation anterior accessory branch of left GSV and stab phlebectomy> 20 incisions left leg by Medford Blade MD    shoulder Bilateral    [phone]   TOENAIL EXCISION Right 2019   Big toe   TONSILLECTOMY Bilateral    TOTAL KNEE ARTHROPLASTY Right 01/01/2022   Procedure: TOTAL KNEE ARTHROPLASTY;  Surgeon: Melodi Lerner, MD;  Location: [facility];  Service: Orthopedics;  Laterality: Right;   HPI:  78 year old woman who presented to [facility] ED 9/8 via EMS as a Code Stroke, concern for seizure-like activity. PMHx significant for HLD, CVA (recent L frontal infarct), TIAs, Bell's palsy, DVT, T2DM c/b neuropathy, SCC (skin). Presented to [facility] earlier on day of admission for low back pain and UTI, prescribed additional-day course of Keflex . Pt was intubated 9/8-9/14.    Assessment /  Plan / Recommendation  Clinical Impression  Pt was seen for a clinical swallow evaluation and presents with supsected mild oropharyngeal dysphagia in the setting of recent intubation.  Pt was encountered awake/alert with family and friends at bedside.  She exhibited intermittent immediate coughing with ice chips and thin liquid.  No overt s/sx of aspiration with nectar-thick liquid, puree, or regular solids.  Mildly prolonged mastication of regular solids noted.  Unable to fully assess solid intake as pt politely declined additional trials after first small trial.  Pt's cough was noted to be very weak and baseline vocal quality was breathy/hoarse.  Recommend initiation of Dysphagia 1 (puree) solids and nectar-thick liquid with medication administered whole in puree (cut/crush large pills).  SLP will f/u to monitor diet tolerance and adjust as appropriate.  SLP Visit Diagnosis: Dysphagia, oropharyngeal phase (R13.12)    Aspiration Risk  Mild aspiration risk    Diet Recommendation Dysphagia 1 (Puree);Nectar-thick liquid    Liquid Administration via: Cup;Straw Medication Administration: Whole meds with puree (cut/crush large pills) Supervision: Patient able to self feed;Full supervision/cueing for compensatory strategies Compensations: Minimize environmental distractions;Slow rate;Small sips/bites    Other  Recommendations Oral Care Recommendations: Oral care BID Caregiver Recommendations: Avoid jello, ice cream, thin soups, popsicles;Remove water  pitcher     Assistance Recommended at Discharge    Functional Status Assessment Patient has had a recent decline in their functional status and demonstrates the ability to  make significant improvements in function in a reasonable and predictable amount of time.  Frequency and Duration min 2x/week  2 weeks       Prognosis        Swallow Study   General Date of Onset: 05/17/24 HPI: 78 year old woman who presented to [facility] ED 9/8 via EMS as a Code  Stroke, concern for seizure-like activity. PMHx significant for HLD, CVA (recent L frontal infarct), TIAs, Bell's palsy, DVT, T2DM c/b neuropathy, SCC (skin). Presented to [facility] earlier on day of admission for low back pain and UTI, prescribed additional-day course of Keflex . Pt was intubated 9/8-9/14. Type of Study: Bedside Swallow Evaluation Previous Swallow Assessment: None Diet Prior to this Study: NPO Temperature Spikes Noted: No Respiratory Status: Room air History of Recent Intubation: Yes Total duration of intubation (days): 6 days Date extubated: 05/17/24 Behavior/Cognition: Alert;Cooperative Oral Cavity Assessment: Within Functional Limits Oral Care Completed by SLP: No Oral Cavity - Dentition: Adequate natural dentition Vision: Functional for self-feeding Self-Feeding Abilities: Needs assist Patient Positioning: Upright in bed Baseline Vocal Quality: Breathy;Hoarse;Low vocal intensity Volitional Cough: Weak Volitional Swallow: Able to elicit    Oral/Motor/Sensory Function Overall Oral Motor/Sensory Function: Moderate impairment Facial ROM: Reduced right Facial Symmetry: Within Functional Limits Facial Strength: Reduced right   Ice Chips Ice chips: Impaired Presentation: Spoon Pharyngeal Phase Impairments: Cough - Immediate   Thin Liquid Thin Liquid: Impaired Presentation: Spoon;Cup Pharyngeal  Phase Impairments: Cough - Immediate    Nectar Thick Nectar Thick Liquid: Within functional limits Presentation: Spoon;Cup;Straw   Honey Thick Honey Thick Liquid: Not tested   Puree Puree: Within functional limits Presentation: Spoon   Solid     Solid: Impaired Oral Phase Functional Implications: Prolonged oral transit     Earnie Cable, M.S., CCC-SLP Acute Rehabilitation Services Office: [phone]  Earnie SQUIBB Ashima Shrake 05/17/2024,3:19 PM

## 2024-05-17 NOTE — Progress Notes (Signed)
 05/17/2024 Agitated, picking at lines. Seroquel  x 1, PRN haldol  Try to avoid over-sedation if we can.  Can use soft restraints if really needed.

## 2024-05-17 NOTE — Progress Notes (Signed)
 vLTM discontinued  No skin breakdown noted at all skin sites  [facility] notified

## 2024-05-17 NOTE — Progress Notes (Incomplete)
 05/17/2024 Increased agitation.

## 2024-05-17 NOTE — Procedures (Signed)
 Extubation Procedure Note  Patient Details:   Name: Beth Macdonald DOB: 1945-11-03 MRN: [id number]   Airway Documentation:    Vent end date: 05/17/24 Vent end time: 0953   Evaluation  O2 sats: stable throughout Complications: No apparent complications Patient did tolerate procedure well. Bilateral Breath Sounds: Diminished, Clear   Yes  [name] Cavalier 05/17/2024, 10:04 AM

## 2024-05-17 NOTE — Progress Notes (Signed)
 NAME:  Beth Macdonald, MRN:  [id number], DOB:  1946/09/01, LOS: 6 ADMISSION DATE:  05/11/2024 CONSULTATION DATE:  05/11/2024 REFERRING MD:  Gennaro - EDP, CHIEF COMPLAINT: Code Stroke, seizures   History of Present Illness:  78 year old woman who presented to [facility] ED 9/8 via EMS as a Code Stroke, concern for seizure-like activity. PMHx significant for HLD, CVA (recent L frontal infarct), TIAs, Bell's palsy, DVT, T2DM c/b neuropathy, SCC (skin). Presented to [facility] earlier on day of admission for low back pain and UTI, prescribed additional-day course of Keflex .   Patient is intubated, therefore history is obtained primarily from chart review and family (wife, [name]) at bedside. Patient's wife states that she had recently been feeling poorly with concern for UTI, states kidneys were a concern due to associated back pain. Presented to [facility] earlier in the day 9/8; wife states she noticed rhythmic L shoulder movement c/f seizure. No history of seizures. This worsened at home and speech became slurred. Patient was then BIB EMS from home with seizure-like activity, weakness and R gaze preference. LKW 1500. NIHSS 38 with symptoms including decreased LOC with disorientation/inability to follow commands, R gaze preference, L facial droop, bilateral UE/LE weakness, global aphasia/dysarthria. Labs were notable for WBC 23.5, Hgb/Plt WNL. INR 1.2. Na 140, K 3.5, CO2 10, BUN/Cr 13/1.50 (baseline 1.3-1.5), LFTs WNL. Ethanol WNL. UA earlier in the day with trace ketones and protein. CT Head was negative for ICH, +chronic small vessel disease with parenchymal volume loss and encephalomalacia of the L frontal lobe/R parietal lobe 2/2 remote infarcts. 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 Neurology was consulted.   Patient was administered Ativan  2mg  and Keppra  load initiated.  Patient mental status declined and she was no longer able to protect her airway; she was subsequently intubated in ED.  PCCM consulted for admission.  Pertinent Medical History:   Past Medical History:  Diagnosis Date   Aortic atherosclerosis (HCC)    Diabetes mellitus without complication (HCC)    Type 2   Diabetic neuropathy (HCC)    DVT of proximal lower limb (HCC)    Facial paralysis/Bells palsy    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HCC)    Hyperlipidemia    Mitral valve insufficiency    Osteoarthritis    SCCA (squamous cell carcinoma) of skin 03/22/2021   Bridge of nose (in situ)   Skull fracture (HCC)    age 78   Squamous cell carcinoma of skin 03/22/2021   Right forehead (in situ)   TIA (transient ischemic attack) 2020   Varicose vein of leg    Significant Hospital Events: Including procedures, antibiotic start and stop dates in addition to other pertinent events   9/8 - Presented to [facility] ED for Code Stroke, seizure-like activity. Previously seen at [facility] ED for UTI, prescribed additional Keflex  x 5-day course. CT Head/CTA Head/Neck as above. Received Ativan /Keppra  load. Required intubation for airway protection. PCCM consulted. 9/9 No seizure activity noted on cEEG. LP performed, low concern for CNS infection based on CSF results. 9/11 seizure activity overnight, started on pulse dose steroids, ID consulted - low concern for infection 9/12 cEEG put back in place  Interim History / Subjective:   No seizures noted on cEEG  Patient following commands and tolerating 5/5 PSV  No acute events overnight  Objective:  Blood pressure 116/64, pulse 70, temperature (!) 97.5 F (36.4 C),  temperature source Axillary, resp. rate (!) 21, height 5' 8 (1.727 m), weight 77.2 kg, SpO2 100%.    Vent Mode: PSV;CPAP FiO2 (%):  [40 %] 40 % PEEP:  [5 cmH20] 5 cmH20 Pressure Support:  [5 cmH20-10 cmH20] 5 cmH20   Intake/Output Summary (Last 24 hours) at 05/17/2024 0713 Last data  filed at 05/17/2024 0700 Gross per 24 hour  Intake 3330.41 ml  Output 1040 ml  Net 2290.41 ml   Filed Weights   05/13/24 0500 05/16/24 0500 05/17/24 0440  Weight: 70.9 kg 79.4 kg 77.2 kg   Physical Examination: General: Acutely ill-appearing elderly woman, intubated HEENT: [place]/AT, anicteric sclera, PERRL, moist mucous membranes Neuro: awake, PERRL, following commands, not moving left extremities CV: rrr, no m/g/r. PULM: clear to auscultation GI: Soft, nontender, nondistended. Normoactive bowel sounds. Extremities: No LE edema noted. Skin: Warm/dry, no rashes.  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 Two clusters of seizure-like activity noted (home, ED). CT Head negative for ICH, +chronic small vessel disease with parenchymal volume loss and encephalomalacia of the L frontal lobe/R parietal lobe 2/2 remote infarcts. 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 MRI 9/10 shows abnormal diffusion signal within the mesial right temporal lobe and hippocampus, with more pronounced T2 and FLAIR signal, could be seizure activity. - Neuro following, appreciate recommendations - CSF negative for infection at this time. ID consult with low concern for infectious etiology at this time. HIV screen negative. RPR is negative. - AEDs per Neuro (Keppra , Versed  PRN) - on going encephalopathy due to infectious vs inflammatory etiology vs post-ictal from seizures - autoimmune panel pending - started 1g solumedrol daily for concern of inflammatory etiology on 9/10, plan for 2 more doses totaling 5 days of  pulse dose steroids - Seizure precautions - Frequent neuro checks - Neuroprotective measures: HOB > 30 degrees, normoglycemia, normothermia, electrolytes WNL - PT/OT/SLP when able to participate in care  Acute respiratory insufficiency in the setting of obtundation requiring mechanical ventilation Pneumonia, gram + cocci and gram negative rods on respiratory aspirate - Continue full vent support (4-8cc/kg IBW) - Wean FiO2 for O2 sat > 90% - Tolerating 5/5 - extubation order placed - VAP bundle - Pulmonary hygiene - ceftriaxone  IV 2g daily for 5 days  Hypotension - continue midodrine  5mg  TID  Acute Systolic Heart Failure HLD Mitral valve insufficiency, mild - Anterior wall and septum are akinetic. Lateral wall, inferior wall, apical segment and apex are hypokinetic - ST Elevations V1 and V2 - cardiology consulted, follow up recs - Resumed ASA/Plavix /statin  - Hold home Repatha  for now - Cardiac monitoring  T2DM Diabetic peripheral neuropathy - SSI, aggressive scale - 15 units lantus  BID - CBGs Q4H - Goal CBG 140-180 - Hold home metformin , semaglutide   Dementia, baseline - Resumed home Aricept   Hypokalemia Hypomagnesemia - replete as needed   Labs:  CBC: Recent Labs  Lab 05/11/24 1800 05/11/24 1805 05/13/24 0825 05/14/24 0817 05/14/24 2344 05/15/24 1056 05/16/24 0537 05/17/24 0440  WBC 23.5*   < > 19.9* 17.3* 12.8* 15.8* 15.4* 15.2*  NEUTROABS 17.8*  --  17.2* 15.2*  --  14.7*  --   --  HGB 14.2   < > 13.9 13.9 12.9 11.5* 11.7* 11.2*  HCT 45.7   < > 41.2 41.5 39.8 35.7* 35.4* 34.0*  MCV 98.7   < > 89.8 91.4 94.8 94.9 92.4 92.4  PLT 389   < > 379 344 285 299 283 288   < > = values in this interval not displayed.   Basic Metabolic Panel: Recent Labs  Lab 05/12/24 0554 05/13/24 0825 05/14/24 0817 05/14/24 2344 05/15/24 1056 05/16/24 0537 05/17/24 0440  NA 138 142 148* 142 138 137 137  K 3.4* 3.3* 3.2* 4.1 4.0 4.4 4.8  CL 99 105 110 107 104 102  103  CO2 19* 21* 23 22 21* 23 24  GLUCOSE 159* 211* 181* 301* 360* 220* 249*  BUN 16 23 42* 49* 57* 54* 54*  CREATININE 1.17* 1.33* 1.53* 1.68* 1.69* 1.43* 1.25*  CALCIUM  9.4 9.4 9.2 8.2* 8.0* 8.0* 8.1*  MG 1.5* 2.4 2.2 2.1  --  2.2 2.2  PHOS 3.3 2.2* 3.1  --   --   --   --    GFR: Estimated Creatinine Clearance: 39.2 mL/min (A) (by C-G formula based on SCr of 1.25 mg/dL (H)). Recent Labs  Lab 05/11/24 2234 05/12/24 0554 05/13/24 0825 05/14/24 2341 05/14/24 2344 05/15/24 0512 05/15/24 1056 05/16/24 0537 05/17/24 0440  PROCALCITON 2.30  --   --   --   --   --   --   --   --   WBC  --  24.4*   < >  --  12.8*  --  15.8* 15.4* 15.2*  LATICACIDVEN 2.1* 2.1*  --  2.7*  --  1.9  --   --   --    < > = values in this interval not displayed.   Liver Function Tests: Recent Labs  Lab 05/11/24 1030 05/11/24 1800 05/14/24 2344 05/15/24 1056  AST 20 37 62* 60*  ALT 13 17 77* 85*  ALKPHOS 58 52 54 58  BILITOT 0.7 0.8 0.5 0.4  PROT 7.6 7.4 5.5* 5.5*  ALBUMIN  4.1 3.3* 2.0* 2.0*   No results for input(s): LIPASE, AMYLASE in the last 168 hours. No results for input(s): AMMONIA in the last 168 hours.  ABG:    Component Value Date/Time   PHART 7.349 (L) 05/11/2024 2211   PCO2ART 34.4 05/11/2024 2211   PO2ART 574 (H) 05/11/2024 2211   HCO3 19.0 (L) 05/11/2024 2211   TCO2 20 (L) 05/11/2024 2211   ACIDBASEDEF 6.0 (H) 05/11/2024 2211   O2SAT 100 05/11/2024 2211    Coagulation Profile: Recent Labs  Lab 05/11/24 1800  INR 1.2   Cardiac Enzymes: Recent Labs  Lab 05/14/24 0933  CKTOTAL 42    HbA1C: Hgb A1c MFr Bld  Date/Time Value Ref Range Status  03/14/2024 09:36 PM 8.4 (H) 4.8 - 5.6 % Final    Comment:    (NOTE)         Prediabetes: 5.7 - 6.4         Diabetes: >6.4         Glycemic control for adults with diabetes: <7.0   11/15/2022 04:36 AM 6.7 (H) 4.8 - 5.6 % Final    Comment:    (NOTE)         Prediabetes: 5.7 - 6.4         Diabetes: >6.4          Glycemic control for adults with diabetes: <7.0    CBG: Recent Labs  Lab  05/16/24 1221 05/16/24 1527 05/16/24 1914 05/16/24 2311 05/17/24 0322  GLUCAP 239* 202* 166* 167* 170*    Critical care time:    The patient is critically ill with multiple organ system failure and requires high complexity decision making for assessment and support, frequent evaluation and titration of therapies, advanced monitoring, review of radiographic studies and interpretation of complex data.   Critical Care Time devoted to patient care services, exclusive of separately billable procedures, described in this note is 40 minutes.  Dorn Chill, MD [facility] Office: [phone]   See Amion for personal pager PCCM on call pager [phone] until 7pm. Please call Elink 7p-7a. [phone]

## 2024-05-18 ENCOUNTER — Other Ambulatory Visit (HOSPITAL_COMMUNITY): Payer: Self-pay

## 2024-05-18 ENCOUNTER — Telehealth (HOSPITAL_COMMUNITY): Payer: Self-pay | Admitting: Pharmacy Technician

## 2024-05-18 DIAGNOSIS — J9601 Acute respiratory failure with hypoxia: Secondary | ICD-10-CM

## 2024-05-18 DIAGNOSIS — N390 Urinary tract infection, site not specified: Secondary | ICD-10-CM | POA: Diagnosis not present

## 2024-05-18 DIAGNOSIS — Z8673 Personal history of transient ischemic attack (TIA), and cerebral infarction without residual deficits: Secondary | ICD-10-CM | POA: Diagnosis not present

## 2024-05-18 DIAGNOSIS — N1832 Chronic kidney disease, stage 3b: Secondary | ICD-10-CM

## 2024-05-18 DIAGNOSIS — G40109 Localization-related (focal) (partial) symptomatic epilepsy and epileptic syndromes with simple partial seizures, not intractable, without status epilepticus: Secondary | ICD-10-CM | POA: Diagnosis not present

## 2024-05-18 DIAGNOSIS — E440 Moderate protein-calorie malnutrition: Secondary | ICD-10-CM

## 2024-05-18 LAB — CBC
HCT: 35 % — ABNORMAL LOW (ref 36.0–46.0)
Hemoglobin: 11.8 g/dL — ABNORMAL LOW (ref 12.0–15.0)
MCH: 30.7 pg (ref 26.0–34.0)
MCHC: 33.7 g/dL (ref 30.0–36.0)
MCV: 91.1 fL (ref 80.0–100.0)
Platelets: 391 K/uL (ref 150–400)
RBC: 3.84 MIL/uL — ABNORMAL LOW (ref 3.87–5.11)
RDW: 13.5 % (ref 11.5–15.5)
WBC: 18.7 K/uL — ABNORMAL HIGH (ref 4.0–10.5)
nRBC: 0.1 % (ref 0.0–0.2)

## 2024-05-18 LAB — GLUCOSE, CAPILLARY
Glucose-Capillary: 75 mg/dL (ref 70–99)
Glucose-Capillary: 81 mg/dL (ref 70–99)
Glucose-Capillary: 112 mg/dL — ABNORMAL HIGH (ref 70–99)
Glucose-Capillary: 288 mg/dL — ABNORMAL HIGH (ref 70–99)
Glucose-Capillary: 221 mg/dL — ABNORMAL HIGH (ref 70–99)

## 2024-05-18 LAB — BASIC METABOLIC PANEL WITH GFR
Anion gap: 11 (ref 5–15)
BUN: 57 mg/dL — ABNORMAL HIGH (ref 8–23)
CO2: 24 mmol/L (ref 22–32)
Calcium: 8.7 mg/dL — ABNORMAL LOW (ref 8.9–10.3)
Chloride: 107 mmol/L (ref 98–111)
Creatinine, Ser: 1.34 mg/dL — ABNORMAL HIGH (ref 0.44–1.00)
GFR, Estimated: 40 mL/min — ABNORMAL LOW (ref >60)
Glucose, Bld: 88 mg/dL (ref 70–99)
Potassium: 4.5 mmol/L (ref 3.5–5.1)
Sodium: 142 mmol/L (ref 135–145)

## 2024-05-18 LAB — MAGNESIUM: Magnesium: 2.1 mg/dL (ref 1.7–2.4)

## 2024-05-18 LAB — PHOSPHORUS: Phosphorus: 3.6 mg/dL (ref 2.5–4.6)

## 2024-05-18 MED ORDER — POLYETHYLENE GLYCOL 3350 17 G PO PACK: 17.0000 g | PACK | Freq: Every day | ORAL | Status: DC

## 2024-05-18 MED ORDER — INSULIN GLARGINE 100 UNIT/ML ~~LOC~~ SOLN
12.0000 [IU] | Freq: Two times a day (BID) | SUBCUTANEOUS | Status: DC
  Filled 2024-05-18 (×2): qty 0.12

## 2024-05-18 MED ORDER — ATORVASTATIN CALCIUM 10 MG PO TABS
20.0000 mg | ORAL_TABLET | Freq: Every day | ORAL | Status: DC
  Administered 2024-05-18 – 2024-05-20 (×3): 20 mg via ORAL
  Filled 2024-05-18 (×3): qty 2

## 2024-05-18 MED ORDER — THIAMINE MONONITRATE 100 MG PO TABS
100.0000 mg | ORAL_TABLET | Freq: Every day | ORAL | Status: AC
  Administered 2024-05-18 – 2024-05-19 (×2): 100 mg via ORAL
  Filled 2024-05-18 (×2): qty 1

## 2024-05-18 MED ORDER — QUETIAPINE FUMARATE 25 MG PO TABS
25.0000 mg | ORAL_TABLET | Freq: Two times a day (BID) | ORAL | Status: DC | PRN
Start: 2024-05-18 — End: 2024-05-19

## 2024-05-18 MED ORDER — LEVETIRACETAM 500 MG PO TABS
500.0000 mg | ORAL_TABLET | Freq: Two times a day (BID) | ORAL | Status: DC
  Administered 2024-05-18: 500 mg via ORAL
  Filled 2024-05-18: qty 1

## 2024-05-18 MED ORDER — ACETAMINOPHEN 500 MG PO TABS
1000.0000 mg | ORAL_TABLET | Freq: Three times a day (TID) | ORAL | Status: DC | PRN
  Administered 2024-05-20: 1000 mg via ORAL
  Filled 2024-05-18: qty 2

## 2024-05-18 MED ORDER — ASPIRIN 81 MG PO CHEW
81.0000 mg | CHEWABLE_TABLET | Freq: Every day | ORAL | Status: DC
  Administered 2024-05-18 – 2024-05-20 (×3): 81 mg via ORAL
  Filled 2024-05-18 (×3): qty 1

## 2024-05-18 MED ORDER — INSULIN GLARGINE 100 UNIT/ML ~~LOC~~ SOLN
8.0000 [IU] | Freq: Two times a day (BID) | SUBCUTANEOUS | Status: DC
  Administered 2024-05-18 – 2024-05-20 (×4): 8 [IU] via SUBCUTANEOUS
  Filled 2024-05-18 (×7): qty 0.08

## 2024-05-18 MED ORDER — MIDODRINE HCL 5 MG PO TABS: 5.0000 mg | ORAL_TABLET | Freq: Three times a day (TID) | ORAL | Status: DC

## 2024-05-18 MED ORDER — LEVETIRACETAM (KEPPRA) 500 MG/5 ML ADULT IV PUSH
500.0000 mg | Freq: Two times a day (BID) | INTRAVENOUS | Status: DC
  Administered 2024-05-18 – 2024-05-19 (×2): 500 mg via INTRAVENOUS
  Filled 2024-05-18 (×2): qty 5

## 2024-05-18 MED ORDER — ADULT MULTIVITAMIN W/MINERALS CH
1.0000 | ORAL_TABLET | Freq: Every day | ORAL | Status: DC
  Administered 2024-05-18 – 2024-05-20 (×3): 1 via ORAL
  Filled 2024-05-18 (×3): qty 1

## 2024-05-18 MED ORDER — KATE FARMS STANDARD 1.4 EN LIQD
325.0000 mL | Freq: Two times a day (BID) | ENTERAL | Status: DC
  Administered 2024-05-18 – 2024-05-20 (×3): 325 mL via ORAL
  Filled 2024-05-18 (×6): qty 325

## 2024-05-18 MED ORDER — CLOPIDOGREL BISULFATE 75 MG PO TABS
75.0000 mg | ORAL_TABLET | Freq: Every day | ORAL | Status: DC
  Administered 2024-05-18 – 2024-05-20 (×3): 75 mg via ORAL
  Filled 2024-05-18 (×3): qty 1

## 2024-05-18 NOTE — Evaluation (Signed)
 Occupational Therapy Evaluation Patient Details Name: Beth Macdonald MRN: [id number] DOB: Dec 24, 1945 Today's Date: 05/18/2024   History of Present Illness   Pt is an 78 y.o. female who presented 05/11/24 with L-sided weakness after d/c'ing home earlier that day from presenting with back pain and UTI. Pt also with seizure activity at home, en route, and in ED. Pt also admitted with Acute systolic heart failure/ST Elevation/Troponin elevation. ETT 9/8 - 9/14. S/p lumbar puncture 9/9. 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     Clinical Impressions Pt has assist at baseline for ADLs and was using RW for mobility, lives with spouse. Pt currently with L deficits, needs up to max A for ADLs, mod-max A for bed mobility and max +2 for transfers with 2 person HHA. Pt presenting with impairments listed below, will follow acutely. Patient will benefit from intensive inpatient follow-up therapy, >3 hours/day to maximize safety/ind with ADL/functional mobility.      If plan is discharge home, recommend the following:   Two people to help with walking and/or transfers;A lot of help with bathing/dressing/bathroom;Assistance with cooking/housework;Direct supervision/assist for medications management;Direct supervision/assist for financial management;Assist for transportation;Help with stairs or ramp for entrance     Functional Status Assessment   Patient has had a recent decline in their functional status and demonstrates the ability to make significant improvements in function in a reasonable and predictable amount of time.     Equipment Recommendations   Other (comment) (defer)     Recommendations for Other Services   PT consult;Rehab consult      Precautions/Restrictions   Precautions Precautions: Fall;Other (comment) Recall of Precautions/Restrictions: Impaired Precaution/Restrictions Comments: L inattention Restrictions Weight Bearing Restrictions Per Provider Order: No     Mobility Bed Mobility Overal bed mobility: Needs Assistance Bed Mobility: Supine to Sit, Rolling Rolling: Used rails, Mod assist   Supine to sit: Max assist, HOB elevated, +2 for physical assistance          Transfers Overall transfer level: Needs assistance Equipment used: Pushed w/c Transfers: Sit to/from Stand Sit to Stand: Max assist, +2 safety/equipment                  Balance Overall balance assessment: Needs assistance Sitting-balance support: Bilateral upper extremity supported, Feet supported Sitting balance-Leahy Scale: Poor Sitting balance - Comments: Pt leans laterally to the R, needing max multi-modal cues and min-modA to correct momentarily and to maintain her static sitting balance at EOB. Postural control: Right lateral lean, Posterior lean, Other (comment) (anterior lean) Standing balance support: Bilateral upper extremity supported, During functional activity Standing balance-Leahy Scale: Poor Standing balance comment: reliant on UE support and maxA to stand with pt maintaining a flexed trunk posture                           ADL either performed or assessed with clinical judgement   ADL Overall ADL's : Needs assistance/impaired Eating/Feeding: Set up;Sitting   Grooming: Set up;Sitting   Upper Body Bathing: Minimal assistance;Sitting   Lower Body Bathing: Moderate assistance   Upper Body Dressing : Minimal assistance   Lower Body Dressing: Maximal assistance   Toilet Transfer: Maximal assistance;+2 for physical assistance   Toileting- Clothing Manipulation and Hygiene: Maximal assistance       Functional mobility  during ADLs: Maximal assistance;+2 for physical assistance        Vision   Vision Assessment?: Vision impaired- to be further tested in functional context Additional Comments: pt reports blurred vision, noted L inattention     Perception Perception: Not tested       Praxis Praxis: Not tested       Pertinent Vitals/Pain Pain Assessment Pain Assessment: No/denies pain     Extremity/Trunk Assessment Upper Extremity Assessment Upper Extremity Assessment: Generalized weakness;LUE deficits/detail;Right hand dominant LUE Deficits / Details: 3/5 globally weak compared to RUE LUE Sensation: WNL LUE Coordination: decreased fine motor;decreased gross motor   Lower Extremity Assessment Lower Extremity Assessment: Defer to PT evaluation   Cervical / Trunk Assessment Cervical / Trunk Assessment: Normal   Communication Communication Communication: Impaired Factors Affecting Communication: Reduced clarity of speech   Cognition Arousal: Alert Behavior During Therapy: WFL for tasks assessed/performed Cognition: Cognition impaired   Orientation impairments: Situation (states she is at hospital for a stroke) Awareness: Online awareness impaired, Intellectual awareness impaired Memory impairment (select all impairments): Short-term memory Attention impairment (select first level of impairment): Sustained attention Executive functioning impairment (select all impairments): Problem solving OT - Cognition Comments: mild impulsivity, oriented to place and self, does not provide accurate PLOF/home setup, states she lives with her mom, frequently asking about spouse, Beth Macdonald                 Following commands: Impaired Following commands impaired: Follows one step commands inconsistently, Follows one step commands with increased time     Cueing  General Comments   Cueing Techniques: Verbal cues;Gestural cues;Tactile cues;Visual cues  VSS on RA   Exercises     Shoulder Instructions      Home Living Family/patient expects to be  discharged to:: Private residence Living Arrangements: Spouse/significant other Available Help at Discharge: Family;Available 24 hours/day;Friend(s) Type of Home: House Home Access: Level entry     Home Layout: One level     Bathroom Shower/Tub: Producer, television/film/video: Standard     Home Equipment: Cane - single Librarian, academic (2 wheels);Shower seat;Grab bars - tub/shower;Toilet riser          Prior Functioning/Environment Prior Level of Function : Needs assist             Mobility Comments: In last week, pt has needed more assist of RW and +1 assist for mobility when she had a UTI. Prior to that pt was mod I using cane vs RW vs furniture surfs ADLs Comments: Wife assisting with dressing and bathing since recent strokes    OT Problem List: Decreased strength;Decreased range of motion;Decreased activity tolerance;Impaired balance (sitting and/or standing);Decreased cognition;Decreased coordination;Impaired vision/perception;Decreased safety awareness   OT Treatment/Interventions: Self-care/ADL training;Therapeutic exercise;Energy conservation;DME and/or AE instruction;Therapeutic activities;Patient/family education;Balance training      OT Goals(Current goals can be found in the care plan section)   Acute Rehab OT Goals Patient Stated Goal: none stated OT Goal Formulation: With patient Time For Goal Achievement: 06/01/24 Potential to Achieve Goals: Good   OT Frequency:  Min 2X/week    Co-evaluation              AM-PAC OT 6 Clicks Daily Activity     Outcome Measure Help from another person eating meals?: A Little Help from another person taking care of personal grooming?: A Little Help from another person toileting, which includes using toliet, bedpan, or urinal?: A Lot Help from another person bathing (including washing, rinsing, drying)?:  A Lot Help from another person to put on and taking off regular upper body clothing?: A Lot Help  from another person to put on and taking off regular lower body clothing?: A Lot 6 Click Score: 14   End of Session Nurse Communication: Mobility status  Activity Tolerance: Patient tolerated treatment well Patient left: in bed;with call bell/phone within reach;with bed alarm set  OT Visit Diagnosis: Unsteadiness on feet (R26.81);Other abnormalities of gait and mobility (R26.89);Muscle weakness (generalized) (M62.81)                Time: 9184-9164 OT Time Calculation (min): 20 min Charges:  OT General Charges $OT Visit: 1 Visit OT Evaluation $OT Eval Moderate Complexity: 1 Mod  Kahlen Morais K, OTD, OTR/L SecureChat Preferred Acute Rehab [phone]   Laneta POUR Koonce 05/18/2024, 10:47 AM

## 2024-05-18 NOTE — Progress Notes (Addendum)
 NAME:  Beth Macdonald, MRN:  [id number], DOB:  1945/09/16, LOS: 7 ADMISSION DATE:  05/11/2024 CONSULTATION DATE:  05/11/2024 REFERRING MD:  Gennaro - EDP, CHIEF COMPLAINT: Code Stroke, seizures   History of Present Illness:  78 year old woman who presented to [facility] ED 9/8 via EMS as a Code Stroke, concern for seizure-like activity. PMHx significant for HLD, CVA (recent L frontal infarct), TIAs, Bell's palsy, DVT, T2DM c/b neuropathy, SCC (skin). Presented to [facility] earlier on day of admission for low back pain and UTI, prescribed additional-day course of Keflex .   Patient is intubated, therefore history is obtained primarily from chart review and family (wife, [name]) at bedside. Patient's wife states that she had recently been feeling poorly with concern for UTI, states kidneys were a concern due to associated back pain. Presented to [facility] earlier in the day 9/8; wife states she noticed rhythmic L shoulder movement c/f seizure. No history of seizures. This worsened at home and speech became slurred. Patient was then BIB EMS from home with seizure-like activity, weakness and R gaze preference. LKW 1500. NIHSS 38 with symptoms including decreased LOC with disorientation/inability to follow commands, R gaze preference, L facial droop, bilateral UE/LE weakness, global aphasia/dysarthria. Labs were notable for WBC 23.5, Hgb/Plt WNL. INR 1.2. Na 140, K 3.5, CO2 10, BUN/Cr 13/1.50 (baseline 1.3-1.5), LFTs WNL. Ethanol WNL. UA earlier in the day with trace ketones and protein. CT Head was negative for ICH, +chronic small vessel disease with parenchymal volume loss and encephalomalacia of the L frontal lobe/R parietal lobe 2/2 remote infarcts. 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 Neurology was consulted.   Patient was administered Ativan  2mg  and Keppra  load initiated.  Patient mental status declined and she was no longer able to protect her airway; she was subsequently intubated in ED.  PCCM consulted for admission.  Pertinent Medical History:   Past Medical History:  Diagnosis Date   Aortic atherosclerosis (HCC)    Diabetes mellitus without complication (HCC)    Type 2   Diabetic neuropathy (HCC)    DVT of proximal lower limb (HCC)    Facial paralysis/Bells palsy    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HCC)    Hyperlipidemia    Mitral valve insufficiency    Osteoarthritis    SCCA (squamous cell carcinoma) of skin 03/22/2021   Bridge of nose (in situ)   Skull fracture (HCC)    age 79   Squamous cell carcinoma of skin 03/22/2021   Right forehead (in situ)   TIA (transient ischemic attack) 2020   Varicose vein of leg    Significant Hospital Events: Including procedures, antibiotic start and stop dates in addition to other pertinent events   9/8 - Presented to [facility] ED for Code Stroke, seizure-like activity. Previously seen at [facility] ED for UTI, prescribed additional Keflex  x 5-day course. CT Head/CTA Head/Neck as above. Received Ativan /Keppra  load. Required intubation for airway protection. PCCM consulted. 9/9 No seizure activity noted on cEEG. LP performed, low concern for CNS infection based on CSF results. 9/11 seizure activity overnight, started on pulse dose steroids, ID consulted - low concern for infection 9/12 cEEG put back in place 9/14 extubated  Interim History / Subjective:  Patient was extubated yesterday, currently on room air Overnight she was agitated requiring Seroquel  Denies any complaint at this time   Objective:  Blood pressure (!) 158/77, pulse 92, temperature  97.6 F (36.4 C), temperature source Oral, resp. rate (!) 21, height 5' 8 (1.727 m), weight 77.2 kg, SpO2 100%.        Intake/Output Summary (Last 24 hours) at 05/18/2024 0746 Last data filed at 05/18/2024 0600 Gross per 24 hour  Intake 640.96 ml  Output 1350 ml   Net -709.04 ml   Filed Weights   05/13/24 0500 05/16/24 0500 05/17/24 0440  Weight: 70.9 kg 79.4 kg 77.2 kg   Physical Examination: General: Acute on chronically ill-appearing elderly female, lying on the bed HEENT: /AT, eyes anicteric.  moist mucus membranes Neuro: Awake, disoriented to place, has complete right facial droop, left upper EXTR with drift, 2/5 on left lower extremity, antigravity on right side Chest: Coarse breath sounds, no wheezes or rhonchi Heart: Regular rate and rhythm, no murmurs or gallops Abdomen: Soft, nontender, nondistended, bowel sounds present  Labs and images reviewed  Patient Lines/Drains/Airways Status     Active Line/Drains/Airways     Name Placement date Placement time Site Days   CVC Triple Lumen 05/14/24 Left Internal jugular 05/14/24  2200  -- 4   External Urinary Catheter 05/16/24  1100  --  2        Resolved Hospital Problem List:  Recent UTI Ketonuria Shock Hypokalemia/hypomagnesemia/Hypernatremia   Assessment & Plan:  Focal status epilepticus, resolved R MCA M2 stenosis, severe with progression from prior 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 Two clusters of seizure-like activity noted (home, ED). CT Head negative for ICH, +chronic small vessel disease with parenchymal volume loss and encephalomalacia of the L frontal lobe/R parietal lobe 2/2 remote infarcts.  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  MRI 9/10 shows abnormal diffusion signal within the mesial right temporal lobe and hippocampus, with more pronounced T2 and FLAIR signal, could be seizure  activity Appreciate neurology follow-up Concern for autoimmune encephalitis, workup is pending Currently on high-dose steroid 1 g daily day 3/3, will start 1 g/kg twice daily by tomorrow and slowly taper down She is still confused but awake and following commands Continue Keppra  500 mg twice daily Continue as needed Seroquel  Avoid deep sedation  Acute respiratory failure with hypoxia Pneumonia with gram-positive cocci in cluster Patient was successfully extubated yesterday, currently on room air She is afebrile Continue ceftriaxone  to complete 5 days therapy Follow-up sensitivity on respiratory culture  Acute Systolic Heart Failure with ST elevations could be due to acute STEMI versus Takotsubo cardiomyopathy HLD Appreciate cardiology follow-up EF is 25 to 30% with Anterior wall and septum are akinetic. Lateral wall, inferior wall, apical segment and apex are hypokinetic Continue aspirin , Plavix  and statin Patient will need cardiac MRI once clinically stable Continue telemetry monitoring  T2DM, complicated with diabetic peripheral neuropathy Blood sugars are controlled Decrease Lantus  to 12 units twice daily Continue sliding scale insulin  with CBG goal 140-180 Hold home metformin , semaglutide   Bell's palsy Patient has complete right-sided facial paralysis  CKD stage IIIb Serum creatinine is at baseline Closely monitor  Moderate Malnutrition  Continue dysphagia diet and supplement  Labs:  CBC: Recent Labs  Lab 05/11/24 1800 05/11/24 1805 05/13/24 0825 05/14/24 0817 05/14/24 2344 05/15/24 1056 05/16/24 0537 05/17/24 0440 05/18/24 0530  WBC 23.5*   < >  19.9* 17.3* 12.8* 15.8* 15.4* 15.2* 18.7*  NEUTROABS 17.8*  --  17.2* 15.2*  --  14.7*  --   --   --   HGB 14.2   < > 13.9 13.9 12.9 11.5* 11.7* 11.2* 11.8*  HCT 45.7   < > 41.2 41.5 39.8 35.7* 35.4* 34.0* 35.0*  MCV 98.7   < > 89.8 91.4 94.8 94.9 92.4 92.4 91.1  PLT 389   < > 379 344 285 299 283 288 391   < > =  values in this interval not displayed.   Basic Metabolic Panel: Recent Labs  Lab 05/12/24 0554 05/13/24 0825 05/14/24 0817 05/14/24 2344 05/15/24 1056 05/16/24 0537 05/17/24 0440 05/18/24 0530  NA 138 142 148* 142 138 137 137 142  K 3.4* 3.3* 3.2* 4.1 4.0 4.4 4.8 4.5  CL 99 105 110 107 104 102 103 107  CO2 19* 21* 23 22 21* 23 24 24   GLUCOSE 159* 211* 181* 301* 360* 220* 249* 88  BUN 16 23 42* 49* 57* 54* 54* 57*  CREATININE 1.17* 1.33* 1.53* 1.68* 1.69* 1.43* 1.25* 1.34*  CALCIUM  9.4 9.4 9.2 8.2* 8.0* 8.0* 8.1* 8.7*  MG 1.5* 2.4 2.2 2.1  --  2.2 2.2 2.1  PHOS 3.3 2.2* 3.1  --   --   --   --  3.6   GFR: Estimated Creatinine Clearance: 36.6 mL/min (A) (by C-G formula based on SCr of 1.34 mg/dL (H)). Recent Labs  Lab 05/11/24 2234 05/12/24 0554 05/13/24 0825 05/14/24 2341 05/14/24 2344 05/15/24 0512 05/15/24 1056 05/16/24 0537 05/17/24 0440 05/18/24 0530  PROCALCITON 2.30  --   --   --   --   --   --   --   --   --   WBC  --  24.4*   < >  --    < >  --  15.8* 15.4* 15.2* 18.7*  LATICACIDVEN 2.1* 2.1*  --  2.7*  --  1.9  --   --   --   --    < > = values in this interval not displayed.   Liver Function Tests: Recent Labs  Lab 05/11/24 1030 05/11/24 1800 05/14/24 2344 05/15/24 1056  AST 20 37 62* 60*  ALT 13 17 77* 85*  ALKPHOS 58 52 54 58  BILITOT 0.7 0.8 0.5 0.4  PROT 7.6 7.4 5.5* 5.5*  ALBUMIN  4.1 3.3* 2.0* 2.0*   No results for input(s): LIPASE, AMYLASE in the last 168 hours. No results for input(s): AMMONIA in the last 168 hours.  ABG:    Component Value Date/Time   PHART 7.349 (L) 05/11/2024 2211   PCO2ART 34.4 05/11/2024 2211   PO2ART 574 (H) 05/11/2024 2211   HCO3 19.0 (L) 05/11/2024 2211   TCO2 20 (L) 05/11/2024 2211   ACIDBASEDEF 6.0 (H) 05/11/2024 2211   O2SAT 100 05/11/2024 2211    Coagulation Profile: Recent Labs  Lab 05/11/24 1800  INR 1.2   Cardiac Enzymes: Recent Labs  Lab 05/14/24 0933  CKTOTAL 42    HbA1C: Hgb  A1c MFr Bld  Date/Time Value Ref Range Status  03/14/2024 09:36 PM 8.4 (H) 4.8 - 5.6 % Final    Comment:    (NOTE)         Prediabetes: 5.7 - 6.4         Diabetes: >6.4         Glycemic control for adults with diabetes: <7.0   11/15/2022 04:36 AM 6.7 (H) 4.8 -  5.6 % Final    Comment:    (NOTE)         Prediabetes: 5.7 - 6.4         Diabetes: >6.4         Glycemic control for adults with diabetes: <7.0    CBG: Recent Labs  Lab 05/17/24 1118 05/17/24 1521 05/17/24 1925 05/17/24 2309 05/18/24 0310  GLUCAP 135* 85 206* 136* 75      Valinda Novas, MD [facility] See Amion for pager If no response to pager, please call [phone] until 7pm After 7pm, Please call E-link [phone]

## 2024-05-18 NOTE — PMR Pre-admission (Shared)
 PMR Admission Coordinator Pre-Admission Assessment  Patient: Beth Macdonald is an 78 y.o., female MRN: [id number] DOB: 07-16-1946 Height: 5' 8 (172.7 cm) Weight: 77.2 kg  Insurance Information HMO:     PPO:      PCP:      IPA:      80/20:      OTHER:  PRIMARY: [employer]      Policy#: [id number]      Subscriber: patient Eff. Date: 03/03/2009     Deduct: $1,676       CIR: 100%      SNF: full 20 days Outpatient: 80%      Home Health: 100%       DME: 80%       SECONDARY: [employer]      Policy#: [id number]     Phone#: [phone]    The "Data Collection Information Summary" for patients in Inpatient Rehabilitation Facilities with attached "Privacy Act Statement-Health Care Records" was provided and verbally reviewed with: Family  Emergency Contact Information Contact Information     Name Relation Home Work [place] Spouse   [phone]      Other Contacts     Name Relation Home Work Mobile   Shur,[name] Friend   [phone]   Sibley,Susan Niece   [phone]       Current Medical History  Patient Admitting Diagnosis: CVA History of Present Illness: Pt is an 78 y.o. female who presented to [facility] 05/11/24 with L-sided weakness after d/c'ing home earlier that day from presenting with back pain and UTI. Pt also with seizure activity at home, en route, and in ED. Pt also admitted with Acute systolic heart failure/ST Elevation/Troponin elevation. Patient was intubated on arrival to protect airway, was extubated 05/17/24.  ETT 9/8 - 9/14. S/p lumbar puncture 9/9. 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  MRI 9/10 shows abnormal diffusion signal within the mesial right temporal lobe and hippocampus, with more pronounced T2 and FLAIR signal, could be seizure activity. 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 Currently on high-dose steroid 1 g daily day 3/3, will start 1 g/kg twice daily by tomorrow and slowly taper down. EF is 25 to 30% with Anterior wall and septum are akinetic. Lateral wall, inferior wall, apical segment and apex are hypokinetic. 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 Therapies are recommending intensive inpatient rehab.  Complete NIHSS TOTAL: 11  Patient's medical record from [facility] has been reviewed by the rehabilitation admission coordinator and physician.  Past Medical History  Past Medical History:  Diagnosis Date   Aortic atherosclerosis (HCC)    Diabetes mellitus without complication (HCC)    Type 2   Diabetic neuropathy (HCC)    DVT of proximal lower limb (HCC)    Facial paralysis/Bells palsy    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HCC)    Hyperlipidemia    Mitral valve insufficiency    Osteoarthritis    SCCA (squamous cell carcinoma) of skin 03/22/2021   Bridge of nose (in situ)   Skull fracture (HCC)    age 53   Squamous cell carcinoma of skin 03/22/2021   Right forehead (in situ)   TIA (transient ischemic attack) 2020  Varicose vein of leg     Has the patient had major surgery during 100 days prior to admission? No  Family History   family history includes Dementia in her father; Hypertension in her father; Leukemia in her brother; Stroke in her brother and mother.  Current Medications  Current Facility-Administered Medications:    acetaminophen  (TYLENOL ) tablet 1,000 mg, 1,000 mg, Oral, Q8H PRN, Dewald, Jonathan B, MD   aspirin  chewable tablet 81 mg, 81 mg, Oral, Daily, Kara Carrier B, MD, 81 mg at 05/18/24 0910   atorvastatin  (LIPITOR) tablet 20 mg, 20 mg, Oral, Daily, Dewald, Jonathan B, MD, 20 mg at  05/18/24 0910   Chlorhexidine  Gluconate Cloth 2 % PADS 6 each, 6 each, Topical, Daily, Ilah Krabbe M, PA-C, 6 each at 05/18/24 9088   clopidogrel  (PLAVIX ) tablet 75 mg, 75 mg, Oral, Daily, Kara Carrier B, MD, 75 mg at 05/18/24 0910   feeding supplement (KATE FARMS STANDARD ENT 1.4) liquid 325 mL, 325 mL, Oral, BID BM, Chand, Sudham, MD, 325 mL at 05/18/24 1229   haloperidol  lactate (HALDOL ) injection 2-5 mg, 2-5 mg, Intravenous, Q6H PRN, Claudene Toribio BROCKS, MD   heparin  injection 5,000 Units, 5,000 Units, Subcutaneous, Q8H, Dewald, Jonathan B, MD, 5,000 Units at 05/18/24 1417   insulin  aspart (novoLOG ) injection 0-20 Units, 0-20 Units, Subcutaneous, Q4H, Dewald, Jonathan B, MD, 3 Units at 05/18/24 0006   insulin  glargine (LANTUS ) injection 8 Units, 8 Units, Subcutaneous, BID, Harold Scholz, MD   levETIRAcetam  (KEPPRA ) tablet 500 mg, 500 mg, Oral, BID, Harold Scholz, MD, 500 mg at 05/18/24 0910   methylPREDNISolone  sodium succinate (SOLU-MEDROL ) 1,000 mg in sodium chloride  0.9 % 50 mL IVPB, 1,000 mg, Intravenous, Q24H, Last Rate: 66 mL/hr at 05/18/24 1423, 1,000 mg at 05/18/24 1423 **AND** [COMPLETED] pantoprazole  (PROTONIX ) injection 40 mg, 40 mg, Intravenous, Q24H, Khaliqdina, Salman, MD, 40 mg at 05/18/24 1141   multivitamin with minerals tablet 1 tablet, 1 tablet, Oral, Daily, Kara Carrier NOVAK, MD, 1 tablet at 05/18/24 0910   nystatin  (MYCOSTATIN /NYSTOP ) topical powder, , Topical, BID, Ilah Krabbe M, PA-C, Given at 05/18/24 9088   ondansetron  (ZOFRAN ) injection 4 mg, 4 mg, Intravenous, Q6H PRN, Ilah Krabbe M, PA-C, 4 mg at 05/13/24 1717   Oral care mouth rinse, 15 mL, Mouth Rinse, 4 times per day, Kara Carrier NOVAK, MD, 15 mL at 05/18/24 1141   Oral care mouth rinse, 15 mL, Mouth Rinse, PRN, Kara Carrier NOVAK, MD   QUEtiapine  (SEROQUEL ) tablet 25 mg, 25 mg, Oral, BID PRN, Harold Scholz, MD   thiamine  (VITAMIN B1) tablet 100 mg, 100 mg, Oral, Daily, Kara Carrier NOVAK, MD, 100  mg at 05/18/24 0910  Patients Current Diet:  Diet Order             DIET - DYS 1 Room service appropriate? No; Fluid consistency: Nectar Thick  Diet effective now                   Precautions / Restrictions Precautions Precautions: Fall, Other (comment) Precaution/Restrictions Comments: L inattention Restrictions Weight Bearing Restrictions Per Provider Order: No   Has the patient had 2 or more falls or a fall with injury in the past year? No  Prior Activity Level    Prior Functional Level Self Care: Did the patient need help bathing, dressing, using the toilet or eating? Needed some help  Indoor Mobility: Did the patient need assistance with walking from room to room (with or without device)? Needed some help  Stairs: Did  the patient need assistance with internal or external stairs (with or without device)? Needed some help  Functional Cognition: Did the patient need help planning regular tasks such as shopping or remembering to take medications? Needed some help  Patient Information Are you of Hispanic, Latino/a,or Spanish origin?: A. No, not of Hispanic, Latino/a, or Spanish origin What is your race?: A. White Do you need or want an interpreter to communicate with a doctor or health care staff?: 0. No Patient information obtained via proxy : yes, spouse  Patient's Response To:  Health Literacy and Transportation Is the patient able to respond to health literacy and transportation needs?: No Health Literacy - How often do you need to have someone help you when you read instructions, pamphlets, or other written material from your doctor or pharmacy?: Never In the past 12 months, has lack of transportation kept you from medical appointments or from getting medications?: No In the past 12 months, has lack of transportation kept you from meetings, work, or from getting things needed for daily living?: No Higher education careers adviser obtained via proxy: yes,  spouse  Journalist, newspaper / Equipment Home Equipment: Cane - single point, Agricultural consultant (2 wheels), Shower seat, Grab bars - tub/shower, Government social research officer  Prior Device Use: Indicate devices/aids used by the patient prior to current illness, exacerbation or injury? Walker  Current Functional Level Cognition  Orientation Level: Oriented to person, Disoriented to time, Disoriented to situation, Disoriented to place    Extremity Assessment (includes Sensation/Coordination)  Upper Extremity Assessment: Generalized weakness, LUE deficits/detail, Right hand dominant LUE Deficits / Details: 3/5 globally weak compared to RUE LUE Sensation: WNL LUE Coordination: decreased fine motor, decreased gross motor  Lower Extremity Assessment: Defer to PT evaluation RLE Deficits / Details: difficult to fully assess due to cognitive deficits and poor command following along with L inattention, but grossly symmetrical in strength bil with gross MMT scores of 3+ to 4-; denied feeling light touch at dorsal feet bil but detected touch at lower legs, wife reports hx of plantar feet sensation deficits but not dorsal deficits in sensation RLE Sensation: decreased light touch RLE Coordination: decreased fine motor, decreased gross motor LLE Deficits / Details: difficult to fully assess due to cognitive deficits and poor command following along with L inattention, but grossly symmetrical in strength bil with gross MMT scores of 3+ to 4-; denied feeling light touch at dorsal feet bil but detected touch at lower legs, wife reports hx of plantar feet sensation deficits but not dorsal deficits in sensation LLE Sensation: decreased light touch LLE Coordination: decreased gross motor, decreased fine motor    ADLs  Overall ADL's : Needs assistance/impaired Eating/Feeding: Set up, Sitting Grooming: Set up, Sitting Upper Body Bathing: Minimal assistance, Sitting Lower Body Bathing: Moderate assistance Upper Body Dressing  : Minimal assistance Lower Body Dressing: Maximal assistance Toilet Transfer: Maximal assistance, +2 for physical assistance Toileting- Clothing Manipulation and Hygiene: Maximal assistance Functional mobility during ADLs: Maximal assistance, +2 for physical assistance    Mobility  Overal bed mobility: Needs Assistance Bed Mobility: Supine to Sit, Rolling Rolling: Used rails, Mod assist Supine to sit: Max assist, HOB elevated, +2 for physical assistance General bed mobility comments: Pt flexed R leg when cued and reached R UE to therapist's hand then L rail as cued to roll to L 1x, maxA at trunk to roll though. Cued pt to move each leg off L EOB one at a time but pt often moved legs that  direction then back to the R, displaying apraxia. Pt needed maxA to manage and keep bil legs off L EOB and to ascend trunk to sit up L EOB.    Transfers  Overall transfer level: Needs assistance Equipment used: Pushed w/c Transfers: Sit to/from Stand Sit to Stand: Max assist, +2 safety/equipment Bed to/from chair/wheelchair/BSC transfer type:: Stand pivot Stand pivot transfers: Max assist General transfer comment: Bil knees blocked and cues pt to hold onto and pull on therapist's trunk to power up to stand, x1 rep from EOB and x1 rep from recliner. Pt maintained a flexed trunk posture despite max cues to correct. Pt required maxA to transfer to stand and stand pivot to L.    Ambulation / Gait / Stairs / Wheelchair Mobility  Ambulation/Gait General Gait Details: unable at this time    Posture / Balance Dynamic Sitting Balance Sitting balance - Comments: Pt leans laterally to the R, needing max multi-modal cues and min-modA to correct momentarily and to maintain her static sitting balance at EOB. Balance Overall balance assessment: Needs assistance Sitting-balance support: Bilateral upper extremity supported, Feet supported Sitting balance-Leahy Scale: Poor Sitting balance - Comments: Pt leans laterally  to the R, needing max multi-modal cues and min-modA to correct momentarily and to maintain her static sitting balance at EOB. Postural control: Right lateral lean, Posterior lean, Other (comment) (anterior lean) Standing balance support: Bilateral upper extremity supported, During functional activity Standing balance-Leahy Scale: Poor Standing balance comment: reliant on UE support and maxA to stand with pt maintaining a flexed trunk posture    Special considerations/life events  Skin ***   Previous Home Environment (from acute therapy documentation) Living Arrangements: Spouse/significant other  Lives With: Spouse Available Help at Discharge: Family, Available 24 hours/day, Friend(s) Type of Home: House Home Layout: One level Home Access: Level entry Bathroom Shower/Tub: Health visitor: Standard Bathroom Accessibility: Yes How Accessible: Accessible via walker  Discharge Living Setting Plans for Discharge Living Setting: Patient's home, Lives with (comment) (spouse) Type of Home at Discharge: House Discharge Home Layout: One level Discharge Home Access: Level entry Discharge Bathroom Shower/Tub: Walk-in shower Discharge Bathroom Toilet: Standard Discharge Bathroom Accessibility: Yes How Accessible: Accessible via walker  Social/Family/Support Systems Anticipated Caregiver: Alfreda Pao Anticipated Caregiver's Contact Information: [phone] Ability/Limitations of Caregiver: can provide min A Caregiver Availability: 24/7 Discharge Plan Discussed with Primary Caregiver: Yes Is Caregiver In Agreement with Plan?: Yes Does Caregiver/Family have Issues with Lodging/Transportation while Pt is in Rehab?: No  Goals Patient/Family Goal for Rehab: min A PT, OT, independent with SLP Expected length of stay: 14-21 days Pt/Family Agrees to Admission and willing to participate: Yes Program Orientation Provided & Reviewed with Pt/Caregiver Including Roles  &  Responsibilities: Yes  Barriers to Discharge: Insurance for SNF coverage  Decrease burden of Care through IP rehab admission: Othern/a  Possible need for SNF placement upon discharge: not anticipated  Patient Condition: I have reviewed medical records from [facility], spoken with TOC, and patient and spouse. I met with patient at the bedside for inpatient rehabilitation assessment.  Patient will benefit from ongoing PT, OT, and SLP, can actively participate in 3 hours of therapy a day 5 days of the week, and can make measurable gains during the admission.  Patient will also benefit from the coordinated team approach during an Inpatient Acute Rehabilitation admission.  The patient will receive intensive therapy as well as Rehabilitation physician, nursing, social worker, and care management interventions.  Due to bladder management, bowel management,  safety, skin/wound care, disease management, medication administration, pain management, and patient education the patient requires 24 hour a day rehabilitation nursing.  The patient is currently Max A +2 with mobility and basic ADLs.  Discharge setting and therapy post discharge at home with home health is anticipated.  Patient has agreed to participate in the Acute Inpatient Rehabilitation Program and will admit ***.  Preadmission Screen Completed By:  COSMO VEAR PLATTER, 05/18/2024 2:32 PM ______________________________________________________________________   Discussed status with Dr. PIERRETTE on *** at *** and received approval for admission today.  Admission Coordinator:  COSMO VEAR PLATTER, time ***/Date ***   Assessment/Plan: Diagnosis: *** Does the need for close, 24 hr/day Medical supervision in concert with the patient's rehab needs make it unreasonable for this patient to be served in a less intensive setting? {yes_no_potentially:3041433} Co-Morbidities requiring supervision/potential complications: *** Due to {due un:6958565}, does the patient  require 24 hr/day rehab nursing? {yes_no_potentially:3041433} Does the patient require coordinated care of a physician, rehab nurse, PT, OT, and SLP to address physical and functional deficits in the context of the above medical diagnosis(es)? {yes_no_potentially:3041433} Addressing deficits in the following areas: {deficits:3041436} Can the patient actively participate in an intensive therapy program of at least 3 hrs of therapy 5 days a week? {yes_no_potentially:3041433} The potential for patient to make measurable gains while on inpatient rehab is {potential:3041437} Anticipated functional outcomes upon discharge from inpatient rehab: {functional outcomes:304600100} PT, {functional outcomes:304600100} OT, {functional outcomes:304600100} SLP Estimated rehab length of stay to reach the above functional goals is: *** Anticipated discharge destination: {anticipated dc setting:21604} 10. Overall Rehab/Functional Prognosis: {potential:3041437}   MD Signature: ***

## 2024-05-18 NOTE — Progress Notes (Signed)
 Inpatient Rehab Coordinator Note:  I met with patient and spouse at bedside to discuss CIR recommendations and goals/expectations of CIR stay.  We reviewed 3 hrs/day of therapy, physician follow up, and average length of stay 2 weeks (dependent upon progress) with goals of min A. Spouse will think things over. She will be able to provide 24/7 assist at home. Will follow up tomorrow.   Rehab Admissons Coordinator Christain Niznik, [place], [place] [phone]

## 2024-05-18 NOTE — Evaluation (Signed)
 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   Acute CVA (cerebrovascular accident) (HCC) 04/12/2024   Acute ischemic stroke (HCC) 03/14/2024   Weakness 11/16/2022   COVID-19 virus infection 11/15/2022   Sepsis (HCC) 11/15/2022   Emesis 11/15/2022   Metabolic acidosis 11/15/2022   CVA (cerebral vascular accident) (HCC) 06/05/2022   Essential hypertension 06/04/2022   Osteoarthritis of right knee 01/01/2022   Bilateral hip pain 11/23/2021   Murmur 03/16/2020   History of stroke 02/02/2019   Mixed hyperlipidemia 02/02/2019   TIA (transient ischemic attack) 10/29/2018   Dyslipidemia 10/29/2018   Type 2 diabetes mellitus with hyperlipidemia (HCC) 10/29/2018   Tendinopathy of hip 07/12/2016   OA (osteoarthritis) of knee 05/29/2016   Sleep disturbance 05/29/2016   Bilateral acromioclavicular joint arthritis 11/08/2015   Dizzy 07/21/2015   Rotator cuff syndrome of both shoulders 06/21/2015   Pain in joint, ankle and foot 10/20/2012   Bilateral leg weakness 10/20/2012   Past Medical History:  Past Medical History:  Diagnosis Date   Aortic atherosclerosis (HCC)    Diabetes mellitus without complication (HCC)    Type 2   Diabetic neuropathy (HCC)    DVT of proximal lower limb (HCC)    Facial paralysis/Bells palsy    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HCC)    Hyperlipidemia    Mitral valve insufficiency    Osteoarthritis    SCCA (squamous cell carcinoma) of skin 03/22/2021   Bridge of nose (in situ)   Skull fracture (HCC)    age 21   Squamous cell  carcinoma of skin 03/22/2021   Right forehead (in situ)   TIA (transient ischemic attack) 2020   Varicose vein of leg    Past Surgical History:  Past Surgical History:  Procedure Laterality Date   ELBOW SURGERY Bilateral    20 yrs ago   ENDOVENOUS ABLATION SAPHENOUS VEIN W/ LASER Bilateral    ENDOVENOUS ABLATION SAPHENOUS VEIN W/ LASER Left 11/19/2019   endovenous laser ablation anterior accessory branch of left GSV and stab phlebectomy> 20 incisions left leg by Medford Blade MD    shoulder Bilateral    7981,7980   TOENAIL EXCISION Right 2019   Big toe   TONSILLECTOMY Bilateral    TOTAL KNEE ARTHROPLASTY Right 01/01/2022   Procedure: TOTAL KNEE ARTHROPLASTY;  Surgeon: Melodi Lerner, MD;  Location: [facility] ORS;  Service: Orthopedics;  Laterality: Right;   HPI:  Pt is an 78 yo female presenting to ED 9/8 with L sided weakness and seizure activity after discharging home earlier in the day from [facility] with back pain and UTI. ETT 9/8-9/14. LP 9/9. 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 PMH includes HLD, CVA, TIA, Bell's palsy, DVT, T2DM c/b neuropathy, SCC   Assessment / Plan / Recommendation Clinical Impression  Pt has a history of cognitive impairments since recent CVA August 2025. She presents with deficits related to attention, awareness, and problem solving observed during functional tasks. She intermittently needs cueing to express herself verbally rather than gesturing and in these instances, she appears to have difficulty with word finding  leading to circumlocution and repetition of incorrect targets. Cueing is beneficial when sequencing functional tasks, including self-feeding. Pt's responses to questions related to her awareness of situation and performance with POs over the weekend varied with inconsistent reports throughout. Family not present to confirm pt's baseline and although baseline cognitive deficits are suspected, her current  presentation may be benefited from SLP f/u for participation in functional tasks while admitted. Will continue following.     SLP Assessment  SLP Recommendation/Assessment: Patient needs continued Speech Language Pathology Services SLP Visit Diagnosis: Cognitive communication deficit (R41.841)     Assistance Recommended at Discharge  Frequent or constant Supervision/Assistance  Functional Status Assessment Patient has had a recent decline in their functional status and demonstrates the ability to make significant improvements in function in a reasonable and predictable amount of time.  Frequency and Duration min 2x/week  2 weeks      SLP Evaluation Cognition  Overall Cognitive Status: History of cognitive impairments - at baseline Arousal/Alertness: Awake/alert Orientation Level: Oriented to person;Disoriented to time;Disoriented to situation;Disoriented to place Attention: Sustained Sustained Attention: Impaired Sustained Attention Impairment: Functional basic Memory: Impaired Memory Impairment: Decreased recall of new information Awareness: Impaired Awareness Impairment: Intellectual impairment Problem Solving: Impaired Problem Solving Impairment: Functional basic       Comprehension  Auditory Comprehension Overall Auditory Comprehension: Appears within functional limits for tasks assessed    Expression Expression Primary Mode of Expression: Verbal Verbal Expression Overall Verbal Expression: Appears within functional limits for tasks assessed   Oral / Motor  Oral Motor/Sensory Function Overall Oral Motor/Sensory Function: Moderate impairment Facial ROM: Reduced right Facial Symmetry: Within Functional Limits Facial Strength: Reduced right Motor Speech Overall Motor Speech: Impaired Respiration: Within functional limits Phonation: Low vocal intensity Resonance: Within functional limits Articulation: Within functional limitis Intelligibility: Intelligible             Damien Blumenthal, M.A., CCC-SLP Speech Language Pathology, Acute Rehabilitation Services  Secure Chat preferred [phone]  05/18/2024, 3:38 PM

## 2024-05-18 NOTE — Progress Notes (Signed)
 Patient's wife notified staffing that patient is becoming agitated, threw a comb at her, picking at her PIV. RN rounded, patient removed gown and telemetry box and was removing the kerlex over the top of the PIV site. RN administered IV 2mg  haldol  for agitation, RN replace the patients gown and telemonitor. Mittens applied. Floor mats continued, bed in lowest positions, call bell within reach. Emotional support given to patient's wife.

## 2024-05-18 NOTE — Telephone Encounter (Signed)
 Patient Product/process development scientist completed.    The patient is insured through [employer]. Patient has Medicare and is not eligible for a copay card, but may be able to apply for patient assistance or Medicare RX Payment Plan (Patient Must reach out to their plan, if eligible for payment plan), if available.    Ran test claim for sacubitril-valsartan (Entresto) 24-26 mg and the current 30 day co-pay is $15.81.  Ran test claim for Farxiga 10 mg and the current 30 day co-pay is $99.55  Ran test claim for Jardiance 10 mg and the current 30 day co-pay is $104.48.  This test claim was processed through [facility]- copay amounts may vary at other pharmacies due to pharmacy/plan contracts, or as the patient moves through the different stages of their insurance plan.     Beth Macdonald, CPHT Pharmacy Technician III Certified Patient Advocate [facility] Patient Advocate Team Direct Number: [phone]  Fax: [phone]

## 2024-05-18 NOTE — Progress Notes (Signed)
 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   Not a candidate for angio or coronray CTA in view of neurologic stability and ability to hold still.  When appropriate and BP is stable could consider Metoprolol succinate 25 mg dialy and Losartan 12.5 to 25 mg daily and add Spironolactone  when BP permits.  Otherwise will signoff for now and please call if questions. D/W Dr. Valinda Novas.    Also diagnostic coronary angiogram is feasible when medically stable to exclude significant CAD.    Gordy Bergamo, MD, [facility] 05/18/2024, 12:00 PM [facility] [address] [place], [place] [address] Phone: [phone]. Fax:  [phone]

## 2024-05-18 NOTE — Progress Notes (Signed)

## 2024-05-18 NOTE — Progress Notes (Signed)
 Speech Language Pathology Treatment: Dysphagia  Patient Details Name: Beth Macdonald MRN: [id number] DOB: 1946-08-19 Today's Date: 05/18/2024 Time: 1420-1430 SLP Time Calculation (min) (ACUTE ONLY): 10 min  Assessment / Plan / Recommendation Clinical Impression  Beth Macdonald is alert but confused, requiring assistance with self-feeding. Her voice remains hypophonic despite increased effort and her cough is weak with voicing present intermittently. She presents with R sided facial weakness secondary to Bell's palsy, though masticated bite-sized solids functionally. Coughing was noted following sips of thin and nectar thick liquids today, raising concern for airway invasion despite the use of thickened liquids. Recommend MBS for further assessment given recent CVA with resultant cognitive impairment and prolonged intubation, all of which increase risk for adverse events in the presence of aspiration. Pending results, recommend NPO except meds crushed in puree. Discussed with MD and RN, will f/u.    HPI HPI: Beth Macdonald is an 78 yo female presenting to ED 9/8 with L sided weakness and seizure activity after discharging home earlier in the day from [facility] with back pain and UTI. ETT 9/8-9/14. LP 9/9. 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 PMH includes HLD, CVA, TIA, Bell's palsy, DVT, T2DM c/b neuropathy, SCC      SLP Plan  MBS          Recommendations  Diet recommendations: NPO Medication Administration: Whole meds with puree                  Oral care QID;Oral care prior to ice chip/H20   Frequent or constant Supervision/Assistance Dysphagia, oropharyngeal phase (R13.12)     MBS     Damien Blumenthal, M.A., CCC-SLP Speech Language Pathology, Acute Rehabilitation Services  Secure Chat preferred [phone]   05/18/2024, 3:05 PM

## 2024-05-18 NOTE — Progress Notes (Signed)
 Nutrition Follow-up  DOCUMENTATION CODES:   Non-severe (moderate) malnutrition in context of chronic illness  INTERVENTION:  Encourage PO intake - Currently on Dysphagia 1, nectar thick liquids  House trays for now  [employer] 1.4 PO BID, each supplement provides 455 kcal and 20 grams protein. * Thickened to correct consistency  MVI with minerals daily  100 mg Thiamine  daily   NUTRITION DIAGNOSIS:   Moderate Malnutrition related to chronic illness as evidenced by moderate muscle depletion, mild fat depletion. - Ongoing  GOAL:   Patient will meet greater than or equal to 90% of their needs - Progressing  MONITOR:   TF tolerance  REASON FOR ASSESSMENT:   Consult Enteral/tube feeding initiation and management  ASSESSMENT:   Pt with PMH of HLD, CVA (recent L frontal infarct), TIAs, Bell's palsy, DVT, DM, SCC (skin) admitted with seizure-like activity.  9/8 - Intubated  9/10 - Tube feeds initiated  9/11 - seizure activity overnight,  9/12 - cEEG put back in place 9/14 extubated, advanced to DYS1 diet, Tube feeds discontinued    Pt was extubated yesterday, currently on room air. Overnight pt was agitated and required Seroquel . Pt lying in bed no family at bedside. Pt with history of dementia, history obtained was limited. Pt is also soft spoken.   Pt last seen by RD 8/11 where history was obtained through pt's wife. Pt still endorses taking Ozempic  and eating 1-2 meals per day consisting of chicken, vegetables, and a starch. Not taking supplements PTA. Pt noted to be lactose intolerant wand was on [employer] po on last admission. Will order these to supplement intake. Pt did not have breakfast ordered this morning. RD ordered breakfast for pt. Notified nursing pt will require assistance with meals. Per nursing pt doe snot like the texture of her diet/liquids and refuses them.   Weight history appears stable however suspect pt with weight loss as prior admission pt with 9 lb  weight loss. Will try to get updated weight history on follow up.   Disposition: CIR  Admit weight: 76 kg Current weight: 77.2 kg   Average Meal Intake: No meals recorded   Nutritionally Relevant Medications: Scheduled Meds:  insulin  aspart  0-20 Units Subcutaneous Q4H   insulin  glargine  8 Units Subcutaneous BID   multivitamin with minerals  1 tablet Oral Daily   thiamine   100 mg Oral Daily   Continuous Infusions:  methylPREDNISolone  (SOLU-MEDROL ) injection Stopped (05/17/24 1638)   Labs Reviewed: BUN 57 Creatinine 1.34 Calcium  8.7  GFR 40  CBG ranges from 75-206 mg/dL over the last 24 hours HgbA1c 8.4  Diet Order:   Diet Order             DIET - DYS 1 Room service appropriate? Yes with Assist; Fluid consistency: Nectar Thick  Diet effective now                   EDUCATION NEEDS:   Not appropriate for education at this time  Skin:  Skin Assessment: Reviewed RN Assessment  Last BM:  9/14 x2 , type 7  Height:   Ht Readings from Last 1 Encounters:  05/11/24 5' 8 (1.727 m)    Weight:   Wt Readings from Last 1 Encounters:  05/17/24 77.2 kg    Ideal Body Weight:  63.6 kg  BMI:  Body mass index is 25.88 kg/m.  Estimated Nutritional Needs:   Kcal:  1600-1800 KCAL  Protein:  85-100 gm  Fluid:  >1.6L/day  Olivia Kenning, RD Registered Dietitian  See Amion for more information

## 2024-05-18 NOTE — Progress Notes (Signed)
 Subjective: No acute events over night.  No new concerns.  Wife and niece at bedside.  Patient is asking if she can eat solid food.  ROS: negative except above  Examination  Vital signs in last 24 hours: Temp:  [97.6 F (36.4 C)-98.7 F (37.1 C)] 98.7 F (37.1 C) (09/15 1200) Pulse Rate:  [71-102] 88 (09/15 1200) Resp:  [16-25] 21 (09/15 1200) BP: (89-174)/(65-142) 122/107 (09/15 1200) SpO2:  [90 %-100 %] 99 % (09/15 1200)  General: Sitting in bed, not in apparent distress Neuro: MS: Alert, oriented, follows commands CN: pupils equal and reactive,  EOMI, face symmetric, tongue midline, normal sensation over face, Motor: 5/5 in right upper extremity, 4/5 in left upper extremity, minimal antigravity strength in right lower extremity, 2/5 in left lower extremity Gait: not tested  Basic Metabolic Panel: Recent Labs  Lab 05/12/24 0554 05/13/24 0825 05/14/24 0817 05/14/24 2344 05/15/24 1056 05/16/24 0537 05/17/24 0440 05/18/24 0530  NA 138 142 148* 142 138 137 137 142  K 3.4* 3.3* 3.2* 4.1 4.0 4.4 4.8 4.5  CL 99 105 110 107 104 102 103 107  CO2 19* 21* 23 22 21* 23 24 24   GLUCOSE 159* 211* 181* 301* 360* 220* 249* 88  BUN 16 23 42* 49* 57* 54* 54* 57*  CREATININE 1.17* 1.33* 1.53* 1.68* 1.69* 1.43* 1.25* 1.34*  CALCIUM  9.4 9.4 9.2 8.2* 8.0* 8.0* 8.1* 8.7*  MG 1.5* 2.4 2.2 2.1  --  2.2 2.2 2.1  PHOS 3.3 2.2* 3.1  --   --   --   --  3.6    CBC: Recent Labs  Lab 05/11/24 1800 05/11/24 1805 05/13/24 0825 05/14/24 0817 05/14/24 2344 05/15/24 1056 05/16/24 0537 05/17/24 0440 05/18/24 0530  WBC 23.5*   < > 19.9* 17.3* 12.8* 15.8* 15.4* 15.2* 18.7*  NEUTROABS 17.8*  --  17.2* 15.2*  --  14.7*  --   --   --   HGB 14.2   < > 13.9 13.9 12.9 11.5* 11.7* 11.2* 11.8*  HCT 45.7   < > 41.2 41.5 39.8 35.7* 35.4* 34.0* 35.0*  MCV 98.7   < > 89.8 91.4 94.8 94.9 92.4 92.4 91.1  PLT 389   < > 379 344 285 299 283 288 391   < > = values in this interval not displayed.      Coagulation Studies: No results for input(s): LABPROT, INR in the last 72 hours.  Imaging No new brain imaging overnight    ASSESSMENT AND PLAN:78 year old female with recent left frontal infarct, prior multiple strokes, recent UTI on Keflex  who presented with status epilepticus which has since resolved   Focal convulsive status epilepticus, resolved Acute encephalopathy, resolved - Etiology of seizure: Likely in the setting of UTI in a patient with underlying strokes - Continue Keppra  500 mg twice daily.   - Last dose of IV solumedrol today - As needed IV Versed  for clinical seizures - Discussed with ICU team - F/u with neuro in 2-3 months  Seizure precautions: Per [place]  DMV statutes, patients with seizures are not allowed to drive until they have been seizure-free for six months and cleared by a physician    Use caution when using heavy equipment or power tools. Avoid working on ladders or at heights. Take showers instead of baths. Ensure the water  temperature is not too high on the home water  heater. Do not go swimming alone. Do not lock yourself in a room alone (i.e. bathroom). When caring for infants  or small children, sit down when holding, feeding, or changing them to minimize risk of injury to the child in the event you have a seizure. Maintain good sleep hygiene. Avoid alcohol.    If patient has another seizure, call 911 and bring them back to the ED if: A.  The seizure lasts longer than 5 minutes.      B.  The patient doesn't wake shortly after the seizure or has new problems such as difficulty seeing, speaking or moving following the seizure C.  The patient was injured during the seizure D.  The patient has a temperature over 102 F (39C) E.  The patient vomited during the seizure and now is having trouble breathing    During the Seizure   - First, ensure adequate ventilation and place patients on the floor on their left side  Loosen clothing around  the neck and ensure the airway is patent. If the patient is clenching the teeth, do not force the mouth open with any object as this can cause severe damage - Remove all items from the surrounding that can be hazardous. The patient may be oblivious to what's happening and may not even know what he or she is doing. If the patient is confused and wandering, either gently guide him/her away and block access to outside areas - Reassure the individual and be comforting - Call 911. In most cases, the seizure ends before EMS arrives. However, there are cases when seizures may last over 3 to 5 minutes. Or the individual may have developed breathing difficulties or severe injuries. If a pregnant patient or a person with diabetes develops a seizure, it is prudent to call an ambulance.     After the Seizure (Postictal Stage)   After a seizure, most patients experience confusion, fatigue, muscle pain and/or a headache. Thus, one should permit the individual to sleep. For the next few days, reassurance is essential. Being calm and helping reorient the person is also of importance.   Most seizures are painless and end spontaneously. Seizures are not harmful to others but can lead to complications such as stress on the lungs, brain and the heart. Individuals with prior lung problems may develop labored breathing and respiratory distress.       I personally spent a total of 38 minutes in the care of the patient today including getting/reviewing separately obtained history, performing a medically appropriate exam/evaluation, counseling and educating, placing orders, referring and communicating with other health care professionals, documenting clinical information in the EHR, independently interpreting results, and coordinating care.       [name] Epilepsy [facility] For questions after 5pm please refer to AMION to reach the Neurologist on call

## 2024-05-19 ENCOUNTER — Inpatient Hospital Stay (HOSPITAL_COMMUNITY): Payer: MEDICARE

## 2024-05-19 ENCOUNTER — Other Ambulatory Visit (HOSPITAL_COMMUNITY): Payer: Self-pay

## 2024-05-19 ENCOUNTER — Telehealth (HOSPITAL_COMMUNITY): Payer: Self-pay | Admitting: Pharmacy Technician

## 2024-05-19 DIAGNOSIS — R9431 Abnormal electrocardiogram [ECG] [EKG]: Secondary | ICD-10-CM | POA: Diagnosis not present

## 2024-05-19 DIAGNOSIS — R569 Unspecified convulsions: Secondary | ICD-10-CM | POA: Diagnosis not present

## 2024-05-19 DIAGNOSIS — J90 Pleural effusion, not elsewhere classified: Secondary | ICD-10-CM | POA: Diagnosis not present

## 2024-05-19 DIAGNOSIS — R918 Other nonspecific abnormal finding of lung field: Secondary | ICD-10-CM | POA: Diagnosis not present

## 2024-05-19 DIAGNOSIS — R0989 Other specified symptoms and signs involving the circulatory and respiratory systems: Secondary | ICD-10-CM | POA: Diagnosis not present

## 2024-05-19 DIAGNOSIS — R7989 Other specified abnormal findings of blood chemistry: Secondary | ICD-10-CM | POA: Diagnosis not present

## 2024-05-19 DIAGNOSIS — R410 Disorientation, unspecified: Secondary | ICD-10-CM

## 2024-05-19 DIAGNOSIS — R0902 Hypoxemia: Secondary | ICD-10-CM | POA: Diagnosis not present

## 2024-05-19 LAB — CBC
HCT: 35.9 % — ABNORMAL LOW (ref 36.0–46.0)
Hemoglobin: 12.3 g/dL (ref 12.0–15.0)
MCH: 30.9 pg (ref 26.0–34.0)
MCHC: 34.3 g/dL (ref 30.0–36.0)
MCV: 90.2 fL (ref 80.0–100.0)
Platelets: 421 K/uL — ABNORMAL HIGH (ref 150–400)
RBC: 3.98 MIL/uL (ref 3.87–5.11)
RDW: 13.8 % (ref 11.5–15.5)
WBC: 20.4 K/uL — ABNORMAL HIGH (ref 4.0–10.5)
nRBC: 0.1 % (ref 0.0–0.2)

## 2024-05-19 LAB — URINALYSIS, ROUTINE W REFLEX MICROSCOPIC
Bilirubin Urine: NEGATIVE
Glucose, UA: NEGATIVE mg/dL
Hgb urine dipstick: NEGATIVE
Ketones, ur: NEGATIVE mg/dL
Leukocytes,Ua: NEGATIVE
Nitrite: NEGATIVE
Protein, ur: NEGATIVE mg/dL
Specific Gravity, Urine: 1.01 (ref 1.005–1.030)
pH: 5 (ref 5.0–8.0)

## 2024-05-19 LAB — GLUCOSE, CAPILLARY
Glucose-Capillary: 114 mg/dL — ABNORMAL HIGH (ref 70–99)
Glucose-Capillary: 134 mg/dL — ABNORMAL HIGH (ref 70–99)
Glucose-Capillary: 134 mg/dL — ABNORMAL HIGH (ref 70–99)
Glucose-Capillary: 136 mg/dL — ABNORMAL HIGH (ref 70–99)
Glucose-Capillary: 139 mg/dL — ABNORMAL HIGH (ref 70–99)
Glucose-Capillary: 158 mg/dL — ABNORMAL HIGH (ref 70–99)
Glucose-Capillary: 287 mg/dL — ABNORMAL HIGH (ref 70–99)

## 2024-05-19 LAB — BASIC METABOLIC PANEL WITH GFR
Anion gap: 15 (ref 5–15)
BUN: 69 mg/dL — ABNORMAL HIGH (ref 8–23)
CO2: 23 mmol/L (ref 22–32)
Calcium: 9.2 mg/dL (ref 8.9–10.3)
Chloride: 107 mmol/L (ref 98–111)
Creatinine, Ser: 1.51 mg/dL — ABNORMAL HIGH (ref 0.44–1.00)
GFR, Estimated: 35 mL/min — ABNORMAL LOW (ref >60)
Glucose, Bld: 106 mg/dL — ABNORMAL HIGH (ref 70–99)
Potassium: 4.6 mmol/L (ref 3.5–5.1)
Sodium: 145 mmol/L (ref 135–145)

## 2024-05-19 MED ORDER — ENOXAPARIN SODIUM 40 MG/0.4ML IJ SOSY
40.0000 mg | PREFILLED_SYRINGE | INTRAMUSCULAR | Status: DC
Start: 2024-05-19 — End: 2024-05-20
  Administered 2024-05-19: 40 mg via SUBCUTANEOUS
  Filled 2024-05-19: qty 0.4

## 2024-05-19 MED ORDER — QUETIAPINE FUMARATE 25 MG PO TABS
12.5000 mg | ORAL_TABLET | Freq: Every evening | ORAL | Status: DC | PRN
  Administered 2024-05-20: 12.5 mg via ORAL
  Filled 2024-05-19: qty 1

## 2024-05-19 MED ORDER — NYSTATIN 100000 UNIT/GM EX POWD
Freq: Two times a day (BID) | CUTANEOUS | Status: DC
  Filled 2024-05-19: qty 15

## 2024-05-19 MED ORDER — LEVETIRACETAM (KEPPRA) 500 MG/5 ML ADULT IV PUSH
500.0000 mg | Freq: Two times a day (BID) | INTRAVENOUS | Status: DC
  Administered 2024-05-19 – 2024-05-20 (×2): 500 mg via INTRAVENOUS
  Filled 2024-05-19 (×2): qty 5

## 2024-05-19 MED ORDER — ORAL CARE MOUTH RINSE
15.0000 mL | OROMUCOSAL | Status: DC
  Administered 2024-05-19 – 2024-05-20 (×4): 15 mL via OROMUCOSAL

## 2024-05-19 MED ORDER — FUROSEMIDE 10 MG/ML IJ SOLN
40.0000 mg | Freq: Two times a day (BID) | INTRAMUSCULAR | Status: DC
  Administered 2024-05-19 – 2024-05-20 (×3): 40 mg via INTRAVENOUS
  Filled 2024-05-19 (×3): qty 4

## 2024-05-19 MED ORDER — QUETIAPINE FUMARATE 25 MG PO TABS
12.5000 mg | ORAL_TABLET | Freq: Every day | ORAL | Status: DC
  Administered 2024-05-19: 12.5 mg via ORAL
  Filled 2024-05-19: qty 1

## 2024-05-19 NOTE — Progress Notes (Addendum)
 PROGRESS NOTE    Beth Macdonald  FMW:[id number] DOB: [birth year] DOA: 05/11/2024 PCP: Clarice Nottingham, MD   78/F w type 2 diabetes mellitus, neuropathy, DVT, fibromuscular hyperplasia, left frontal stroke, admitted on 9/8 with slurred speech focal seizure and right gaze preference, loaded with Keppra , Ativan , CTA head and neck showed right MCA M2 branch occlusion with progression , intubated in the ED for airway protection. - Also treated for UTI day of admission - Felt to have focal status epilepticus secondary to UTI versus autoimmune encephalitis, neurology following, Rx w high-dose steroids and Keppra , along with ABX for UTI. 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 - Concern for aspiration, now n.p.o. 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 Lateral wall,  inferior wall, apical segment and apex are hypokinetic Continue aspirin , Plavix  and statin -Cards signed off, not felt to be a candidate for ischemic workup at this time, recommended metoprolol losartan and Aldactone  when blood pressure tolerates -vol overloaded, 8L Pos, add Iv lasix    T2DM, complicated with diabetic peripheral neuropathy -Either stable, continue current dose of Lantus , metformin  and semaglutide  on hold   CKD stage IIIb Serum creatinine is at baseline Closely monitor   Moderate Malnutrition  -now w/ dysphagia  Worsening leukocytosis - Check UA and chest x-ray    DVT prophylaxis: lovenox  Code Status: Full Code Family Communication: No fam at bedside Disposition Plan:   Consultants:    Procedures:   Antimicrobials:    Objective: Vitals:   05/18/24 2304 05/19/24 0332 05/19/24 0430 05/19/24 0743  BP: (!) 168/70 (!) 145/82  (!) 141/56  Pulse: 98 89  95  Resp: 18 18  19   Temp: 97.9 F (36.6 C) 98.6 F (37 C)  97.7 F (36.5 C)  TempSrc: Oral Oral  Oral  SpO2: 99%   97%  Weight:   75 kg   Height:        Intake/Output Summary (Last 24 hours) at 05/19/2024 1126 Last data filed at 05/19/2024 0941 Gross per 24 hour  Intake 260 ml  Output 1250 ml  Net -990 ml   Filed Weights   05/16/24 0500 05/17/24 0440 05/19/24 0430  Weight: 79.4 kg 77.2 kg 75 kg    Examination:  General exam: Chronically ill sitting up in bed, alert, oriented to self and place, no distress, dysarthria HEENT: No JVD CVS: S1-S2, regular rhythm Lungs: Decreased breath sounds at the bases Abdomen soft, nontender, bowel sounds present Extremities: Left lower extremity weakness Psychiatry: Flat affect  Data Reviewed:   CBC: Recent Labs  Lab 05/13/24 0825 05/14/24 0817 05/14/24 2344 05/15/24 1056 05/16/24 0537 05/17/24 0440 05/18/24 0530 05/19/24 0858  WBC 19.9* 17.3*   < > 15.8* 15.4* 15.2* 18.7* 20.4*  NEUTROABS 17.2* 15.2*  --  14.7*  --   --   --   --   HGB  13.9 13.9   < > 11.5* 11.7* 11.2* 11.8* 12.3  HCT 41.2 41.5   < > 35.7* 35.4* 34.0* 35.0* 35.9*  MCV 89.8 91.4   < > 94.9 92.4 92.4 91.1 90.2  PLT 379 344   < > 299 283 288 391 421*   < > = values in this interval not displayed.   Basic Metabolic Panel: Recent Labs  Lab 05/13/24 0825 05/14/24 0817 05/14/24 2344 05/15/24 1056 05/16/24 0537 05/17/24 0440 05/18/24 0530 05/19/24 0858  NA 142 148* 142 138 137 137 142 145  K 3.3* 3.2* 4.1 4.0 4.4 4.8 4.5 4.6  CL 105 110 107 104 102 103 107 107  CO2 21* 23 22 21* 23 24 24 23   GLUCOSE 211* 181* 301* 360* 220* 249* 88 106*  BUN 23 42* 49* 57* 54* 54* 57* 69*  CREATININE 1.33* 1.53* 1.68* 1.69* 1.43* 1.25* 1.34* 1.51*  CALCIUM  9.4 9.2 8.2* 8.0* 8.0* 8.1* 8.7* 9.2  MG 2.4 2.2 2.1  --  2.2 2.2 2.1  --   PHOS 2.2* 3.1  --   --   --   --  3.6  --    GFR: Estimated Creatinine Clearance: 30 mL/min (A) (by C-G formula based on SCr of 1.51 mg/dL (H)). Liver Function Tests: Recent Labs  Lab 05/14/24 2344 05/15/24 1056  AST 62* 60*  ALT 77* 85*  ALKPHOS 54 58  BILITOT 0.5 0.4  PROT 5.5* 5.5*  ALBUMIN  2.0* 2.0*   No results for input(s): LIPASE, AMYLASE in the last 168 hours. No results for input(s): AMMONIA in the last 168 hours. Coagulation Profile: No results for input(s): INR, PROTIME in the last 168 hours. Cardiac Enzymes: Recent Labs  Lab 05/14/24 0933  CKTOTAL 42   BNP (last 3 results) No results for input(s): PROBNP in the last 8760 hours. HbA1C: No results for input(s): HGBA1C in the last 72 hours. CBG: Recent Labs  Lab 05/18/24 1639 05/18/24 2207 05/19/24 0206 05/19/24 0611 05/19/24 0745  GLUCAP 288* 221* 114* 136* 134*   Lipid Profile: No results for input(s): CHOL, HDL, LDLCALC, TRIG, CHOLHDL, LDLDIRECT in the last 72 hours. Thyroid  Function Tests: No results for input(s): TSH, T4TOTAL, FREET4, T3FREE, THYROIDAB in the last 72 hours. Anemia Panel: No results for  input(s): VITAMINB12, FOLATE, FERRITIN, TIBC, IRON, RETICCTPCT in the last 72 hours. Urine analysis:    Component Value Date/Time   COLORURINE YELLOW 05/11/2024 1327   APPEARANCEUR CLEAR 05/11/2024 1327   LABSPEC 1.015 05/11/2024 1327   PHURINE 5.0 05/11/2024 1327   GLUCOSEU NEGATIVE 05/11/2024 1327   HGBUR NEGATIVE 05/11/2024 1327   BILIRUBINUR NEGATIVE 05/11/2024 1327   KETONESUR 20 (A) 05/11/2024 1327   PROTEINUR 30 (A) 05/11/2024 1327   NITRITE NEGATIVE 05/11/2024 1327   LEUKOCYTESUR NEGATIVE 05/11/2024 1327   Sepsis Labs: @LABRCNTIP (procalcitonin:4,lacticidven:4)  ) Recent Results (from the past 240 hours)  Urine Culture     Status: None   Collection Time: 05/11/24  1:27 PM   Specimen: Urine, Clean Catch  Result Value Ref Range Status   Specimen Description   Final    URINE, CLEAN CATCH Performed at [facility], [address], [place], [place] [address]    Special Requests   Final    NONE Performed at [facility], [address] [place], [place] [address]    Culture   Final    NO GROWTH Performed at [facility], [address], [place], [place] [address]    Report Status 05/12/2024 FINAL  Final  MRSA Next Gen by PCR, Nasal     Status: None   Collection Time: 05/11/24  7:49 PM   Specimen: Nasal Mucosa; Nasal Swab  Result Value Ref Range Status   MRSA by PCR Next Gen NOT DETECTED NOT DETECTED Final    Comment: (NOTE) The GeneXpert MRSA Assay (FDA approved for NASAL specimens only), is one component of a comprehensive MRSA colonization surveillance program. It is not intended to diagnose MRSA infection nor to guide or monitor treatment for MRSA infections. Test performance is not FDA approved in patients less than 45 years old. Performed at [facility], [address] [place], [place] [address]   Culture, blood (Routine X 2) w Reflex to ID Panel     Status: None   Collection Time: 05/11/24  10:22 PM   Specimen: BLOOD RIGHT HAND  Result Value Ref Range Status   Specimen Description BLOOD RIGHT HAND  Final   Special Requests   Final    BOTTLES DRAWN AEROBIC AND ANAEROBIC Blood Culture results may not be optimal due to an inadequate volume of blood received in culture bottles   Culture   Final    NO GROWTH 5 DAYS Performed at [facility], [address] [place], [place] [address]    Report Status 05/16/2024 FINAL  Final  Culture, blood (Routine X 2) w Reflex to ID Panel     Status: None   Collection Time: 05/11/24 10:34 PM   Specimen: BLOOD LEFT HAND  Result Value Ref Range Status   Specimen Description BLOOD LEFT HAND  Final   Special Requests   Final    BOTTLES DRAWN AEROBIC AND ANAEROBIC Blood Culture results may not be optimal due to an inadequate volume of blood received in culture bottles   Culture   Final    NO GROWTH 5 DAYS Performed at [facility], [address], [place], [place] [address]    Report Status 05/16/2024 FINAL  Final  CSF culture w Gram Stain     Status: None   Collection Time: 05/12/24 11:34 AM   Specimen: CSF; Cerebrospinal Fluid  Result Value Ref Range Status   Specimen Description CSF  Final   Special Requests LP  Final   Gram Stain   Final    WBC PRESENT, PREDOMINANTLY MONONUCLEAR NO ORGANISMS SEEN CYTOSPIN SMEAR    Culture   Final    NO GROWTH 3 DAYS Performed at [facility], [address], [place], [place] [address]    Report Status 05/15/2024 FINAL  Final  Culture, Respiratory w Gram Stain     Status: None   Collection Time: 05/13/24  8:47 AM   Specimen: Tracheal Aspirate; Respiratory  Result Value Ref Range Status   Specimen Description TRACHEAL ASPIRATE  Final   Special Requests NONE  Final   Gram Stain   Final    ABUNDANT WBC PRESENT, PREDOMINANTLY PMN MODERATE GRAM POSITIVE COCCI IN CLUSTERS IN CHAINS FEW GRAM NEGATIVE RODS    Culture   Final    Normal respiratory flora-no Staph aureus  or  Pseudomonas seen Performed at [facility], [address] [place], [place] [address]    Report Status 05/16/2024 FINAL  Final     Radiology Studies: No results found.   Scheduled Meds:  aspirin   81 mg Oral Daily   atorvastatin   20 mg Oral Daily   Chlorhexidine  Gluconate Cloth  6 each Topical Daily   clopidogrel   75 mg Oral Daily   enoxaparin  (LOVENOX ) injection  40 mg Subcutaneous Q24H   feeding supplement ([employer] STAND[employer] ENT 1.4)  325 mL Oral BID BM   furosemide   40 mg Intravenous BID   insulin  aspart  0-20 Units Subcutaneous Q4H   insulin  glargine  8 Units Subcutaneous BID   levETIRAcetam   500 mg Intravenous Q12H   multivitamin with minerals  1 tablet Oral Daily   nystatin    Topical BID   mouth rinse  15 mL Mouth Rinse 3 times per day   QUEtiapine   12.5 mg Oral QHS   Continuous Infusions:   LOS: 8 days    Time spent:    Sigurd Pac, MD [facility]   05/19/2024, 11:27 AM

## 2024-05-19 NOTE — Progress Notes (Signed)
 Physical Therapy Treatment Patient Details Name: Beth Macdonald MRN: [id number] DOB: [birth year] Today's Date: 05/19/2024   History of Present Illness Pt is an 78 y.o. female who presented 05/11/24 with L-sided weakness after d/c'ing home earlier that day from presenting with back pain and UTI. Pt also with seizure activity at home, en route, and in ED. Pt also admitted with Acute systolic heart failure/ST Elevation/Troponin elevation. ETT 9/8 - 9/14. S/p lumbar puncture 9/9. 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    PT Comments  Pt received in supine and agreeable to session.  Pt requires max A-max A +2 for bed mobility and transfers due to weakness, impaired balance, and decreased awareness. Stedy utilized for transfer to the recliner and pt requires max A +2 to elevate hips enough to lower pads. Increased cues required for command following and problem solving. Pt continues to benefit from PT services to progress toward functional mobility goals.    If plan is discharge home, recommend the following: Two people to help with walking and/or transfers;Two people to help with bathing/dressing/bathroom;Assistance with cooking/housework;Direct supervision/assist for medications management;Direct supervision/assist for financial management;Assist for transportation;Supervision due to cognitive status   Can travel by private vehicle        Equipment Recommendations  Wheelchair (measurements PT);Wheelchair cushion (measurements PT);BSC/3in1;Hospital bed;Hoyer lift    Recommendations for Other Services       Precautions / Restrictions Precautions Precautions: Fall;Other (comment) Recall of Precautions/Restrictions: Impaired Precaution/Restrictions Comments: L  inattention Restrictions Weight Bearing Restrictions Per Provider Order: No     Mobility  Bed Mobility Overal bed mobility: Needs Assistance Bed Mobility: Supine to Sit     Supine to sit: Max assist     General bed mobility comments: Dense cues for sequencing and max A to sit to EOB    Transfers Overall transfer level: Needs assistance   Transfers: Sit to/from Stand, Bed to chair/wheelchair/BSC Sit to Stand: Max assist, +2 safety/equipment           General transfer comment: Attempted STS from EOB x2, but unable to clear hips without +2. Pt demonstrates R lateral lean and requires dense cues and assist for hand placement Transfer via Lift Equipment: Stedy  Ambulation/Gait                   Stairs             Wheelchair Mobility     Tilt Bed    Modified Rankin (Stroke Patients Only) Modified Rankin (Stroke Patients Only) Pre-Morbid Rankin Score: Moderate disability Modified Rankin: Severe disability     Balance Overall balance assessment: Needs assistance Sitting-balance support: Bilateral upper extremity supported, Feet supported Sitting balance-Leahy Scale: Poor Sitting balance - Comments: R lateral lean requiring assist to correct despite cues for hand placement   Standing balance support: Bilateral upper extremity supported, During functional activity, Reliant on assistive device for balance Standing balance-Leahy Scale: Zero Standing balance comment: reliant on external support                            Communication Communication Communication: Impaired Factors Affecting Communication: Reduced clarity of speech  Cognition Arousal: Alert Behavior During Therapy: WFL for tasks assessed/performed   PT - Cognitive impairments: Awareness, Attention, Initiation, Memory, Sequencing, Problem solving, Safety/Judgement  Following commands: Impaired Following commands impaired: Follows one step  commands inconsistently, Follows one step commands with increased time    Cueing Cueing Techniques: Verbal cues, Gestural cues, Tactile cues, Visual cues  Exercises      General Comments        Pertinent Vitals/Pain Pain Assessment Pain Assessment: Faces Faces Pain Scale: Hurts little more Pain Location: generalized Pain Descriptors / Indicators: Discomfort, Grimacing, Guarding Pain Intervention(s): Limited activity within patient's tolerance, Monitored during session, Repositioned     PT Goals (current goals can now be found in the care plan section) Acute Rehab PT Goals Patient Stated Goal: to improve PT Goal Formulation: With patient/family Time For Goal Achievement: 05/31/24 Progress towards PT goals: Progressing toward goals    Frequency    Min 3X/week       AM-PAC PT 6 Clicks Mobility   Outcome Measure  Help needed turning from your back to your side while in a flat bed without using bedrails?: A Lot Help needed moving from lying on your back to sitting on the side of a flat bed without using bedrails?: A Lot Help needed moving to and from a bed to a chair (including a wheelchair)?: Total Help needed standing up from a chair using your arms (e.g., wheelchair or bedside chair)?: Total Help needed to walk in hospital room?: Total Help needed climbing 3-5 steps with a railing? : Total 6 Click Score: 8    End of Session Equipment Utilized During Treatment: Gait belt Activity Tolerance: Patient limited by fatigue Patient left: in chair;with call bell/phone within reach;with family/visitor present Nurse Communication: Mobility status;Need for lift equipment PT Visit Diagnosis: Unsteadiness on feet (R26.81);Other abnormalities of gait and mobility (R26.89);Muscle weakness (generalized) (M62.81);Difficulty in walking, not elsewhere classified (R26.2);Other symptoms and signs involving the nervous system (R29.898)     Time: 8683-8656 PT Time Calculation (min)  (ACUTE ONLY): 27 min  Charges:    $Therapeutic Activity: 23-37 mins PT General Charges $$ ACUTE PT VISIT: 1 Visit                    Darryle George, PTA Acute Rehabilitation Services Secure Chat Preferred  [phone]    Darryle George 05/19/2024, 2:57 PM

## 2024-05-19 NOTE — H&P (Shared)
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 Intubation: Yes  Behavior/Cognition: Alert; Cooperative Self-Feeding Abilities: Needs assist with self-feeding Baseline vocal quality/speech: Dysphonic Volitional Cough: Able to elicit Volitional Swallow: Able to elicit Exam Limitations: Poor positioning Goal Planning: Prognosis for improved oropharyngeal function: Good Barriers to Reach Goals: Cognitive deficits; Time post onset No data recorded  Patient/Family Stated Goal: none stated Consulted and agree with results and recommendations: Patient; Nurse Pain: Pain Assessment Pain Assessment: No/denies pain End of Session: Start Time:SLP Start Time (ACUTE ONLY): 1042 Stop Time: SLP Stop Time (ACUTE ONLY): 1100 Time Calculation:SLP Time Calculation (min) (ACUTE ONLY): 18 min Charges: SLP Evaluations $ SLP Speech Visit: 1 Visit SLP Evaluations $MBS Swallow: 1 Procedure $ SLP EVAL LANGUAGE/SOUND PRODUCTION: 1 Procedure $Swallowing Treatment: 1 Procedure SLP visit diagnosis: SLP Visit Diagnosis: Dysphagia, oropharyngeal phase (R13.12)  Past Medical History: Past Medical History: Diagnosis Date  Aortic atherosclerosis (HCC)   Diabetes mellitus without complication (HCC)   Type 2  Diabetic neuropathy (HCC)   DVT of proximal lower limb (HCC)   Facial paralysis/Bells palsy   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HCC)   Hyperlipidemia   Mitral valve insufficiency   Osteoarthritis   SCCA (squamous cell carcinoma) of skin 03/22/2021  Bridge of nose (in situ)  Skull fracture (HCC)   age 69  Squamous cell carcinoma of skin 03/22/2021  Right forehead (in situ)  TIA (transient ischemic attack) 2020  Varicose vein of leg  Past Surgical History: Past Surgical History: Procedure Laterality Date  ELBOW SURGERY Bilateral   20 yrs ago  ENDOVENOUS ABLATION SAPHENOUS VEIN W/ LASER Bilateral   ENDOVENOUS ABLATION SAPHENOUS VEIN W/ LASER Left 11/19/2019  endovenous laser ablation anterior accessory branch of left GSV and stab phlebectomy> 20 incisions left leg by Medford Blade MD   shoulder Bilateral   [phone]  TOENAIL EXCISION Right 2019  Big toe  TONSILLECTOMY Bilateral   TOTAL KNEE ARTHROPLASTY Right 01/01/2022  Procedure: TOTAL KNEE ARTHROPLASTY;  Surgeon: Melodi Lerner, MD;  Location: [facility];  Service: Orthopedics;  Laterality: Right; Damien Blumenthal, M.A., CCC-SLP Speech Language Pathology, Acute Rehabilitation Services Secure Chat preferred [phone] 05/19/2024, 12:48 PM     Blood pressure 106/73, pulse 98, temperature 98.6 F (37 C), temperature source Oral, resp. rate 16, height 5' 8 (1.727 m), weight 75 kg, SpO2 95%.  Medical Problem List and Plan: 1. Functional deficits secondary to ***  -patient may *** shower  -ELOS/Goals: *** 2.  Antithrombotics: -DVT/anticoagulation:  Pharmaceutical: Lovenox   -antiplatelet therapy: ASA/Plavis resumed 09/10.  3. Pain Management: tylenol  prn.  4. Mood/Behavior/Sleep: LCSW to follow for evaluation and support.   -antipsychotic agents: Seroquel  5. Neuropsych/cognition: This patient is not capable of  making decisions on her own behalf. 6. Skin/Wound Care: Routine pressure relief measures.  7. Fluids/Electrolytes/Nutrition: Strict I/O. Check CMET in am 8. Aspiration PNA: Completed course of Rocephin . Started on Augmentin  09/17 for increase in Right base opacity. .  --WBC trending up 12.8-->15.2-->18.7-->20.4. Steroid effect?   --Recheck CXR in am.  9. ST elevation/ Acute sytolic CHF: Takotsubo v/s LAD disease. Strict I/O. Daily weights --Weight 167 at admission -->165 on 09/17.  10. Acute renal failure: BUN/SCr on steady rise with IV lasix  started on 09/16  --Recheck labs in am and may need to back off of worsening. 11. Hypernatremia: Likely due to poor intake.  --Recheck BMET in am 12. T2DM: Hgb A1c-8.4. Monitor BS ac/hs and use SSI for elevated BS  --Continue lantus  8 units BID-->may need to adjust as off tube feeds.  13.  Dysphagia: On D2, nectar liquids 14. LUE edema: Question due to IV infiltration v/s DVT. Dopplers ordered 15. 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      ***  Sharlet GORMAN Schmitz, PA-C 05/20/2024

## 2024-05-19 NOTE — Progress Notes (Signed)
 Patient going to [facility] for MBS. RN spoke to [facility]. RN made patient aware, [facility] disconnected and SCDs off for transport via transport tech.

## 2024-05-19 NOTE — Progress Notes (Signed)
 PT Cancellation Note  Patient Details Name: Beth Macdonald MRN: [id number] DOB: 1946-07-09   Cancelled Treatment:    Reason Eval/Treat Not Completed: (P) Patient at procedure or test/unavailable (Pt off floor. Will follow up as able.)   Darryle George 05/19/2024, 10:56 AM

## 2024-05-19 NOTE — Telephone Encounter (Signed)
 Patient Product/process development scientist completed.    The patient is insured through [employer]. Patient has Medicare and is not eligible for a copay card, but may be able to apply for patient assistance or Medicare RX Payment Plan (Patient Must reach out to their plan, if eligible for payment plan), if available.    Ran test claim for Pylera (brand and generic) and Not on Formulary   This test claim was processed through [facility]- copay amounts may vary at other pharmacies due to [employer], or as the patient moves through the different stages of their insurance plan.     Reyes Sharps, CPHT Pharmacy Technician III Certified Patient Advocate [facility] Patient Advocate Team Direct Number: [phone]  Fax: [phone]

## 2024-05-19 NOTE — Progress Notes (Signed)
 Inpatient Rehab Admissions Coordinator:   Met with pt and her spouse at bedside.  I reviewed again CIR goals/expectations of ~2-3 weeks of therapy and goals of 24/7 physical assist for w/c level mobility/adls.  Pt's spouse, [name], is elderly and has RA as well, so is limited in what she is safely able to provide in the way of physical care for pt.  We discussed possible CIR admission with likely planned discharge to SNF for further rehabilitation at the conclusion of which decisions could be made regarding long term plans for care.  Both are on board with pursuing CIR tomorrow after taking some time to process our conversation. I'd also like to see how she does with PT session today to gauge progress.    Reche Lowers, PT, DPT Admissions Coordinator [phone] 05/19/24  1:48 PM

## 2024-05-19 NOTE — Plan of Care (Signed)
   Problem: Education: Goal: Ability to describe self-care measures that may prevent or decrease complications (Diabetes Survival Skills Education) will improve Outcome: Progressing   Problem: Fluid Volume: Goal: Ability to maintain a balanced intake and output will improve Outcome: Progressing   Problem: Nutritional: Goal: Maintenance of adequate nutrition will improve Outcome: Progressing

## 2024-05-19 NOTE — Plan of Care (Signed)
  Problem: Education: Goal: Ability to describe self-care measures that may prevent or decrease complications (Diabetes Survival Skills Education) will improve Outcome: Progressing Goal: Individualized Educational Video(s) Outcome: Progressing   Problem: Coping: Goal: Ability to adjust to condition or change in health will improve Outcome: Progressing   Problem: Fluid Volume: Goal: Ability to maintain a balanced intake and output will improve Outcome: Progressing   Problem: Health Behavior/Discharge Planning: Goal: Ability to identify and utilize available resources and services will improve Outcome: Progressing Goal: Ability to manage health-related needs will improve Outcome: Progressing   Problem: Metabolic: Goal: Ability to maintain appropriate glucose levels will improve Outcome: Progressing   Problem: Nutritional: Goal: Maintenance of adequate nutrition will improve Outcome: Progressing Goal: Progress toward achieving an optimal weight will improve Outcome: Progressing   Problem: Skin Integrity: Goal: Risk for impaired skin integrity will decrease Outcome: Progressing   Problem: Tissue Perfusion: Goal: Adequacy of tissue perfusion will improve Outcome: Progressing   Problem: Education: Goal: Knowledge of General Education information will improve Description: Including pain rating scale, medication(s)/side effects and non-pharmacologic comfort measures Outcome: Progressing   Problem: Health Behavior/Discharge Planning: Goal: Ability to manage health-related needs will improve Outcome: Progressing   Problem: Clinical Measurements: Goal: Ability to maintain clinical measurements within normal limits will improve Outcome: Progressing Goal: Will remain free from infection Outcome: Progressing Goal: Diagnostic test results will improve Outcome: Progressing Goal: Respiratory complications will improve Outcome: Progressing Goal: Cardiovascular complication will  be avoided Outcome: Progressing   Problem: Activity: Goal: Risk for activity intolerance will decrease Outcome: Progressing   Problem: Nutrition: Goal: Adequate nutrition will be maintained Outcome: Progressing   Problem: Coping: Goal: Level of anxiety will decrease Outcome: Progressing   Problem: Elimination: Goal: Will not experience complications related to bowel motility Outcome: Progressing Goal: Will not experience complications related to urinary retention Outcome: Progressing   Problem: Pain Managment: Goal: General experience of comfort will improve and/or be controlled Outcome: Progressing   Problem: Safety: Goal: Ability to remain free from injury will improve Outcome: Progressing   Problem: Skin Integrity: Goal: Risk for impaired skin integrity will decrease Outcome: Progressing   Problem: Education: Goal: Knowledge of disease or condition will improve Outcome: Progressing Goal: Knowledge of secondary prevention will improve (MUST DOCUMENT ALL) Outcome: Progressing Goal: Knowledge of patient specific risk factors will improve (DELETE if not current risk factor) Outcome: Progressing   Problem: Ischemic Stroke/TIA Tissue Perfusion: Goal: Complications of ischemic stroke/TIA will be minimized Outcome: Progressing   Problem: Coping: Goal: Will verbalize positive feelings about self Outcome: Progressing Goal: Will identify appropriate support needs Outcome: Progressing   Problem: Health Behavior/Discharge Planning: Goal: Ability to manage health-related needs will improve Outcome: Progressing Goal: Goals will be collaboratively established with patient/family Outcome: Progressing   Problem: Self-Care: Goal: Ability to participate in self-care as condition permits will improve Outcome: Progressing Goal: Verbalization of feelings and concerns over difficulty with self-care will improve Outcome: Progressing Goal: Ability to communicate needs  accurately will improve Outcome: Progressing   Problem: Nutrition: Goal: Risk of aspiration will decrease Outcome: Progressing Goal: Dietary intake will improve Outcome: Progressing   Problem: Safety: Goal: Non-violent Restraint(s) Outcome: Progressing

## 2024-05-19 NOTE — Progress Notes (Signed)
 Nutrition Follow-up  DOCUMENTATION CODES:   Non-severe (moderate) malnutrition in context of chronic illness  INTERVENTION:  Encourage PO intake - Currently on Dysphagia 2, honey thick liquids  Change to house trays House trays   [employer] 1.4 PO BID, each supplement provides 455 kcal and 20 grams protein. * Thickened to correct consistency  MVI with minerals daily  100 mg Thiamine  daily   NUTRITION DIAGNOSIS:   Moderate Malnutrition related to chronic illness as evidenced by moderate muscle depletion, mild fat depletion. - Ongoing  GOAL:   Patient will meet greater than or equal to 90% of their needs - Progressing  MONITOR:   TF tolerance  REASON FOR ASSESSMENT:   Consult Enteral/tube feeding initiation and management  ASSESSMENT:   Pt with PMH of HLD, CVA (recent L frontal infarct), TIAs, Bell's palsy, DVT, DM, SCC (skin) admitted with seizure-like activity.  9/8 - Intubated  9/10 - Tube feeds initiated  9/11 - seizure activity overnight,  9/12 - cEEG put back in place 9/14 extubated, advanced to DYS1 diet, Tube feeds discontinued   9/16 MBS today, advanced to DYS2 + honey thick   Changed to NPO yesterday by SLP, passed MBS this morning. Dysphagia 2 (Finely chopped);Moderately thick liquids (Level 3, honey thick). Agitation overnight, given haldol  and placed in soft restraints with telesitter. Patient had about 90% of lunch this afternoon. Spouse reports patient tried [employer] drink and enjoyed it. No reports of nausea or vomiting.    Disposition: CIR  Weight Information (since admission)     Date/Time Weight Weight in lbs BSA (Calculated - sq m) BMI (Calculated) Who   05/19/24 0430 75 kg 165.35 lbs -- 25.15 RT   05/17/24 0440 77.2 kg 170.2 lbs -- 25.88 JM   05/16/24 0500 79.4 kg 175.05 lbs -- 26.62 SM   05/13/24 0500 70.9 kg 156.31 lbs -- 23.77 CW   05/12/24 0500 71.6 kg 157.85 lbs -- 24.01 OG   05/11/24 2200 69.5 kg 153.22 lbs -- 23.3 OG   05/11/24  18:54:18 76 kg 167.55 lbs 1.91 sq meters 25.48 CB        Average Meal Intake: No meals recorded   Nutritionally Relevant Medications: Scheduled Meds:  furosemide   40 mg Intravenous BID   insulin  aspart  0-20 Units Subcutaneous Q4H   insulin  glargine  8 Units Subcutaneous BID   multivitamin with minerals  1 tablet Oral Daily     Continuous Infusions:   Labs Reviewed: BUN 69 Creatinine 1.51 GFR 35 CBG ranges from 114-288 mg/dL over the last 24 hours   Diet Order:   Diet Order             DIET DYS 2 Room service appropriate? Yes with Assist; Fluid consistency: Honey Thick  Diet effective now                   EDUCATION NEEDS:   Not appropriate for education at this time  Skin:  Skin Assessment: Reviewed RN Assessment  Last BM:  9/14 x2 , type 7  Height:   Ht Readings from Last 1 Encounters:  05/11/24 5' 8 (1.727 m)    Weight:   Wt Readings from Last 1 Encounters:  05/19/24 75 kg    Ideal Body Weight:  63.6 kg  BMI:  Body mass index is 25.14 kg/m.  Estimated Nutritional Needs:   Kcal:  1600-1800 KCAL  Protein:  85-100 gm  Fluid:  >1.6L/day   Sylvestre Rathgeber, MS, RD, LDN Clinical  Dietitian  Please see AMiON for contact information.

## 2024-05-19 NOTE — PMR Pre-admission (Signed)
 PMR Admission Coordinator Pre-Admission Assessment  Patient: Beth Macdonald is an 78 y.o., female MRN: [id number] DOB: [birth year] Height: 5' 8 (172.7 cm) Weight: 75 kg  Insurance Information HMO:     PPO:      PCP:      IPA:      80/20:      OTHER:  PRIMARY: [employer]     Policy#: [id number]       Subscriber: pt CM Name:       Phone#:      Fax#:  Pre-Cert#: verified Health and safety inspector:  Benefits:  Phone #:      Name:  Eff. Date: 03/03/09 A/B     Deduct: $1676      Out of Pocket Max: n/a      Life Max:  CIR: 100%      SNF: 20 full days Outpatient: 80%     Co-Pay: 20% Home Health: 100%      Co-Pay:  DME: 80%     Co-Pay: 20% Providers:  SECONDARYBETHA BRIGHTER      Policy#: [id number]      Phone#: [phone]  Financial Counselor:       Phone#:   The "Data Collection Information Summary" for patients in Inpatient Rehabilitation Facilities with attached "Privacy Act Statement-Health Care Records" was provided and verbally reviewed with: Patient and Family  Emergency Contact Information Contact Information     Name Relation Home Work [place] Spouse   [phone]      Other Contacts     Name Relation Home Work Mobile   Shur,[name] Friend   [phone]   Sibley,Susan Niece   [phone]       Current Medical History  Patient Admitting Diagnosis: encephalopathy  History of Present Illness: Pt is an 78 y/o female with PMH of DM, neuropathy, DVT, fibromuscular hyperplasia, R MCA stenosis, 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  She had another seizure en route to ED and again in the ED lasting 30-40 seconds.  CTA head/neck showed R MCA occlusion with progression of stenosis since previous study.  She received ativan  and keppra , but developed hypoxic respiratory failure requiring intubation on 9/8. MRI  revealed abnormal signal in the mesial right temporal and right hippocampal formation favored to be related to seizure/post ictal changes.  LP done revealing mononuclear WBC without organisms.  CT renal study c/w cystitis.  Seizures felt to be in setting of UTI and stroke, but pt's encephalopathy persisted.  She started a 5 day course of ceftriaxone  on 9/11.  Hospital course significant for ST elevation and systolic heart failure.  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  She was not a cath candidate and cardiology signed off.  She was extubated on 9/14 and is saturating well on room air.  MBSS on 9/16 and she was started on D2/honey diet with aspiration precautions.  Therapy ongoing and pt has been recommended for CIR>   Complete NIHSS TOTAL: 14  Patient's medical record from [facility] has been reviewed by the rehabilitation admission coordinator and physician.  Past Medical History  Past Medical History:  Diagnosis Date   Aortic atherosclerosis (HCC)    Diabetes mellitus without complication (HCC)    Type 2   Diabetic neuropathy (HCC)  DVT of proximal lower limb (HCC)    Facial paralysis/Bells palsy    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HCC)    Hyperlipidemia    Mitral valve insufficiency    Osteoarthritis    SCCA (squamous cell carcinoma) of skin 03/22/2021   Bridge of nose (in situ)   Skull fracture (HCC)    age 22   Squamous cell carcinoma of skin 03/22/2021   Right forehead (in situ)   TIA (transient ischemic attack) 2020   Varicose vein of leg     Has the patient had major surgery during 100 days prior to admission? No  Family History   family history includes Dementia in her father; Hypertension in her father; Leukemia in her brother; Stroke in her brother and mother.  Current Medications  Current Facility-Administered Medications:    acetaminophen  (TYLENOL ) tablet 1,000 mg, 1,000 mg, Oral, Q8H PRN, Dewald,  Jonathan B, MD, 1,000 mg at 05/20/24 0025   aspirin  chewable tablet 81 mg, 81 mg, Oral, Daily, Kara Dorn NOVAK, MD, 81 mg at 05/19/24 0940   atorvastatin  (LIPITOR) tablet 20 mg, 20 mg, Oral, Daily, Dewald, Jonathan B, MD, 20 mg at 05/19/24 0941   Chlorhexidine  Gluconate Cloth 2 % PADS 6 each, 6 each, Topical, Daily, Ilah Krabbe M, PA-C, 6 each at 05/18/24 0911   clopidogrel  (PLAVIX ) tablet 75 mg, 75 mg, Oral, Daily, Kara Dorn B, MD, 75 mg at 05/19/24 0940   enoxaparin  (LOVENOX ) injection 30 mg, 30 mg, Subcutaneous, Q24H, Reome, Earle J, RPH   feeding supplement (KATE FARMS STANDARD ENT 1.4) liquid 325 mL, 325 mL, Oral, BID BM, Chand, Sudham, MD, 325 mL at 05/19/24 1427   furosemide  (LASIX ) injection 40 mg, 40 mg, Intravenous, BID, Joseph, Preetha, MD, 40 mg at 05/20/24 0843   haloperidol  lactate (HALDOL ) injection 2-5 mg, 2-5 mg, Intravenous, Q6H PRN, Claudene Toribio BROCKS, MD, 2 mg at 05/18/24 1800   insulin  aspart (novoLOG ) injection 0-20 Units, 0-20 Units, Subcutaneous, Q4H, Dewald, Jonathan B, MD, 3 Units at 05/20/24 0443   insulin  glargine (LANTUS ) injection 8 Units, 8 Units, Subcutaneous, BID, Harold Scholz, MD, 8 Units at 05/19/24 2119   levETIRAcetam  (KEPPRA ) undiluted injection 500 mg, 500 mg, Intravenous, Q12H, Yadav, Priyanka O, MD, 500 mg at 05/20/24 9362   multivitamin with minerals tablet 1 tablet, 1 tablet, Oral, Daily, Dewald, Jonathan B, MD, 1 tablet at 05/19/24 0940   nystatin  (MYCOSTATIN /NYSTOP ) topical powder, , Topical, BID, Yadav, Priyanka O, MD, Given at 05/19/24 1638   ondansetron  (ZOFRAN ) injection 4 mg, 4 mg, Intravenous, Q6H PRN, Ilah Krabbe M, PA-C, 4 mg at 05/13/24 1717   Oral care mouth rinse, 15 mL, Mouth Rinse, PRN, Dewald, Jonathan B, MD   Oral care mouth rinse, 15 mL, Mouth Rinse, 3 times per day, Shelton Arlin KIDD, MD, 15 mL at 05/20/24 0843   QUEtiapine  (SEROQUEL ) tablet 12.5 mg, 12.5 mg, Oral, QHS, Yadav, Priyanka O, MD, 12.5 mg at 05/19/24 1757    QUEtiapine  (SEROQUEL ) tablet 12.5 mg, 12.5 mg, Oral, QHS PRN, Yadav, Priyanka O, MD, 12.5 mg at 05/20/24 0025  Patients Current Diet:  Diet Order             DIET DYS 2 Room service appropriate? No; Fluid consistency: Honey Thick  Diet effective now                   Precautions / Restrictions Precautions Precautions: Fall, Other (comment) Precaution/Restrictions Comments: L inattention Restrictions Weight Bearing Restrictions Per Provider Order: No  Has the patient had 2 or more falls or a fall with injury in the past year? Yes  Prior Activity Level Community (5-7x/wk): prior to CVA in August was independent without DME.  Prior Functional Level Self Care: Did the patient need help bathing, dressing, using the toilet or eating? Independent  Indoor Mobility: Did the patient need assistance with walking from room to room (with or without device)? Independent  Stairs: Did the patient need assistance with internal or external stairs (with or without device)? Independent  Functional Cognition: Did the patient need help planning regular tasks such as shopping or remembering to take medications? Independent  Patient Information Are you of Hispanic, Latino/a,or Spanish origin?: A. No, not of Hispanic, Latino/a, or Spanish origin What is your race?: A. White Do you need or want an interpreter to communicate with a doctor or health care staff?: 0. No Patient information obtained via proxy : yes, spouse  Patient's Response To:  Health Literacy and Transportation Is the patient able to respond to health literacy and transportation needs?: No Health Literacy - How often do you need to have someone help you when you read instructions, pamphlets, or other written material from your doctor or pharmacy?: Never In the past 12 months, has lack of transportation kept you from medical appointments or from getting medications?: No In the past 12 months, has lack of transportation kept you  from meetings, work, or from getting things needed for daily living?: No Higher education careers adviser obtained via proxy: yes, spouse  Journalist, newspaper / Equipment Home Equipment: Medical laboratory scientific officer - single point, Agricultural consultant (2 wheels), Information systems manager, Grab bars - tub/shower, Government social research officer  Prior Device Use: Indicate devices/aids used by the patient prior to current illness, exacerbation or injury? cane  Current Functional Level Cognition  Arousal/Alertness: Awake/alert Overall Cognitive Status: History of cognitive impairments - at baseline Orientation Level: Oriented to person, Oriented to place, Disoriented to time, Disoriented to situation Attention: Sustained Sustained Attention: Impaired Sustained Attention Impairment: Functional basic Memory: Impaired Memory Impairment: Decreased recall of new information Awareness: Impaired Awareness Impairment: Intellectual impairment Problem Solving: Impaired Problem Solving Impairment: Functional basic    Extremity Assessment (includes Sensation/Coordination)  Upper Extremity Assessment: Generalized weakness, LUE deficits/detail, Right hand dominant LUE Deficits / Details: 3/5 globally weak compared to RUE LUE Sensation: WNL LUE Coordination: decreased fine motor, decreased gross motor  Lower Extremity Assessment: Defer to PT evaluation RLE Deficits / Details: difficult to fully assess due to cognitive deficits and poor command following along with L inattention, but grossly symmetrical in strength bil with gross MMT scores of 3+ to 4-; denied feeling light touch at dorsal feet bil but detected touch at lower legs, wife reports hx of plantar feet sensation deficits but not dorsal deficits in sensation RLE Sensation: decreased light touch RLE Coordination: decreased fine motor, decreased gross motor LLE Deficits / Details: difficult to fully assess due to cognitive deficits and poor command following along with L inattention, but grossly  symmetrical in strength bil with gross MMT scores of 3+ to 4-; denied feeling light touch at dorsal feet bil but detected touch at lower legs, wife reports hx of plantar feet sensation deficits but not dorsal deficits in sensation LLE Sensation: decreased light touch LLE Coordination: decreased gross motor, decreased fine motor    ADLs  Overall ADL's : Needs assistance/impaired Eating/Feeding: Set up, Sitting Grooming: Set up, Sitting Upper Body Bathing: Minimal assistance, Sitting Lower Body Bathing: Moderate assistance Upper Body Dressing : Minimal  assistance Lower Body Dressing: Maximal assistance Toilet Transfer: Maximal assistance, +2 for physical assistance Toileting- Clothing Manipulation and Hygiene: Maximal assistance Functional mobility during ADLs: Maximal assistance, +2 for physical assistance    Mobility  Overal bed mobility: Needs Assistance Bed Mobility: Supine to Sit Rolling: Used rails, Mod assist Supine to sit: Max assist General bed mobility comments: Dense cues for sequencing and max A to sit to EOB    Transfers  Overall transfer level: Needs assistance Equipment used: Pushed w/c Transfers: Sit to/from Stand, Bed to chair/wheelchair/BSC Sit to Stand: Max assist, +2 safety/equipment Bed to/from chair/wheelchair/BSC transfer type:: Via Lift equipment Stand pivot transfers: Max Banker via Lift Equipment: Stedy General transfer comment: Attempted STS from EOB x2, but unable to clear hips without +2. Pt demonstrates R lateral lean and requires dense cues and assist for hand placement    Ambulation / Gait / Stairs / Wheelchair Mobility  Ambulation/Gait General Gait Details: unable at this time    Posture / Balance Dynamic Sitting Balance Sitting balance - Comments: R lateral lean requiring assist to correct despite cues for hand placement Balance Overall balance assessment: Needs assistance Sitting-balance support: Bilateral upper extremity supported,  Feet supported Sitting balance-Leahy Scale: Poor Sitting balance - Comments: R lateral lean requiring assist to correct despite cues for hand placement Postural control: Right lateral lean, Posterior lean, Other (comment) (anterior lean) Standing balance support: Bilateral upper extremity supported, During functional activity, Reliant on assistive device for balance Standing balance-Leahy Scale: Zero Standing balance comment: reliant on external support    Special considerations/life events  Diabetic management yes   Previous Home Environment (from acute therapy documentation) Living Arrangements: Spouse/significant other  Lives With: Spouse Available Help at Discharge: Family, Available 24 hours/day, Friend(s) Type of Home: House Home Layout: One level Home Access: Level entry Bathroom Shower/Tub: Health visitor: Standard Bathroom Accessibility: Yes How Accessible: Accessible via walker  Discharge Living Setting Plans for Discharge Living Setting: Patient's home, Lives with (comment) (spouse) Type of Home at Discharge: House Discharge Home Layout: One level Discharge Home Access: Level entry Discharge Bathroom Shower/Tub: Walk-in shower Discharge Bathroom Toilet: Standard Discharge Bathroom Accessibility: Yes How Accessible: Accessible via walker  Social/Family/Support Systems Anticipated Caregiver: Alfreda Pao Anticipated Caregiver's Contact Information: [phone] Ability/Limitations of Caregiver: can provide min A Caregiver Availability: 24/7 Discharge Plan Discussed with Primary Caregiver: Yes Is Caregiver In Agreement with Plan?: Yes Does Caregiver/Family have Issues with Lodging/Transportation while Pt is in Rehab?: No  Goals Patient/Family Goal for Rehab: PT/OT min assist, SLP supervision Expected length of stay: 14-21 days Pt/Family Agrees to Admission and willing to participate: Yes Program Orientation Provided & Reviewed with Pt/Caregiver  Including Roles  & Responsibilities: Yes  Decrease burden of Care through IP rehab admission: Diet advancement, Decrease number of caregivers, and Bowel and bladder program  Possible need for SNF placement upon discharge: Likely.  Pt's s/o able to provide very little in the way of physical assist at discharge.  Would benefit from short CIR stay to decrease burden of care with planned d/c to SNF for ongoing rehabilitation.   Patient Condition: I have reviewed medical records from [facility], spoken with CSW, and patient and spouse. I met with patient at the bedside for inpatient rehabilitation assessment.  Patient will benefit from ongoing PT, OT, and SLP, can actively participate in 3 hours of therapy a day 5 days of the week, and can make measurable gains during the admission.  Patient will also benefit from the coordinated  team approach during an Inpatient Acute Rehabilitation admission.  The patient will receive intensive therapy as well as Rehabilitation physician, nursing, social worker, and care management interventions.  Due to bladder management, bowel management, safety, skin/wound care, disease management, medication administration, pain management, and patient education the patient requires 24 hour a day rehabilitation nursing.  The patient is currently max +2 with mobility and basic ADLs.  Discharge setting and therapy post discharge at skilled nursing facility is anticipated.  Patient has agreed to participate in the Acute Inpatient Rehabilitation Program and will admit today.  Preadmission Screen Completed By:  Reche FORBES Lowers, PT, DPT 05/20/2024 10:33 AM ______________________________________________________________________   Discussed status with Dr. Lorilee on 05/20/24  at 10:33 AM  and received approval for admission today.  Admission Coordinator:  Caitlin E Warren, PT, DPT time 10:33 AM Pattricia  05/20/24    Assessment/Plan: Diagnosis: Focal seizures Does the need for close, 24 hr/day  Medical supervision in concert with the patient's rehab needs make it unreasonable for this patient to be served in a less intensive setting? Yes Co-Morbidities requiring supervision/potential complications:  Overweight Encephalopathy DM Neuropathy DVT Fibromuscular hyperplasia Due to bladder management, bowel management, safety, skin/wound care, disease management, medication administration, pain management, and patient education, does the patient require 24 hr/day rehab nursing? Yes Does the patient require coordinated care of a physician, rehab nurse, PT, OT, and SLP to address physical and functional deficits in the context of the above medical diagnosis(es)? Yes Addressing deficits in the following areas: balance, endurance, locomotion, strength, transferring, bowel/bladder control, bathing, dressing, feeding, grooming, toileting, cognition, and psychosocial support Can the patient actively participate in an intensive therapy program of at least 3 hrs of therapy 5 days a week? Yes The potential for patient to make measurable gains while on inpatient rehab is excellent Anticipated functional outcomes upon discharge from inpatient rehab: max assist PT, max assist OT, max assist SLP Estimated rehab length of stay to reach the above functional goals is: 10-14 days Anticipated discharge destination: Home 10. Overall Rehab/Functional Prognosis: excellent   MD Signature: Sven Lorilee, MD

## 2024-05-19 NOTE — Progress Notes (Addendum)
 Inpatient Rehab Admissions Coordinator:   Me with pt at bedside this morning.  She is alert, says she's in the hospital for a stroke, and states she recalls conversation with my partner yesterday but unable to recall any information from that conversation.  She is agreeable to CIR stay.  I'd like to speak with her spouse as well, but no family at bedside.  When I returned, pt off the floor for MBSS.  Will try to touch base with spouse via phone.    1155: returned to room, pt's friend [name] visiting and reports [name] is on the way.  I left my card so she can call when she arrives.   Reche Lowers, PT, DPT Admissions Coordinator [phone] 05/19/24  10:47 AM

## 2024-05-19 NOTE — Progress Notes (Signed)
 Modified Barium Swallow Study  Patient Details  Name: Beth Macdonald MRN: [id number] Date of Birth: 1945-12-16  Today's Date: 05/19/2024  Modified Barium Swallow completed.  Full report located under Chart Review in the Imaging Section.  History of Present Illness Pt is an 78 yo female presenting to ED 9/8 with L sided weakness and seizure activity after discharging home earlier in the day from [facility] with back pain and UTI. ETT 9/8-9/14. LP 9/9. 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 PMH includes HLD, CVA, TIA, Bell's palsy, DVT, T2DM c/b neuropathy, SCC   Clinical Impression Pt exhibits severe oropharyngeal dysphagia primarily characterized by deficits related to cognitive factors and subsequent mistiming. This leads to penetration before the swallow with thin and nectar thick liquids resulting in aspiration during the swallow (PAS 7). Pt's spontaneous cough is ineffective, containing significant voicing and limited force. She is unable to follow commands to try an oral hold or supraglottic swallow. Timing improves with honey thick liquids, contibuting to improved airway protection. Although difficult to visualize due to her positioning, PES opening appears minimal though complete esophageal clearance is achieved. Mastication of regular solids was minimal with swallow initiation of unchewed cracker observed. Recommend diet of Dys 2 solids with honey thick liquids. SLP will continue following to target RMT and use of strategies. Factors that may increase risk of adverse event in presence of aspiration Noe & Lianne 2021): Poor general health and/or compromised immunity;Reduced cognitive function;Limited mobility;Reduced saliva;Weak cough  Swallow Evaluation Recommendations Recommendations: PO diet PO Diet Recommendation: Dysphagia 2 (Finely chopped);Moderately thick liquids (Level 3, honey thick) Liquid Administration via:  Cup;Straw Medication Administration: Crushed with puree Supervision: Staff to assist with self-feeding;Full supervision/cueing for swallowing strategies Swallowing strategies  : Minimize environmental distractions;Slow rate;Small bites/sips Postural changes: Position pt fully upright for meals;Stay upright 30-60 min after meals Oral care recommendations: Oral care QID (4x/day) Caregiver Recommendations: Avoid jello, ice cream, thin soups, popsicles;Remove water  pitcher    Damien Blumenthal, M.A., CCC-SLP Speech Language Pathology, Acute Rehabilitation Services  Secure Chat preferred [phone]  05/19/2024,11:54 AM

## 2024-05-19 NOTE — Progress Notes (Signed)
 Subjective: Was confused, agitated over night.  Reports having trouble sleeping last night.  ROS: negative except above  Examination  Vital signs in last 24 hours: Temp:  [97.7 F (36.5 C)-99.4 F (37.4 C)] 97.7 F (36.5 C) (09/16 0743) Pulse Rate:  [88-107] 95 (09/16 0743) Resp:  [16-25] 19 (09/16 0743) BP: (120-168)/(52-107) 141/56 (09/16 0743) SpO2:  [95 %-100 %] 97 % (09/16 0743) Weight:  [75 kg] 75 kg (09/16 0430)  General: Sitting in bed, not in apparent distress Neuro: MS: Alert, oriented, follows commands CN: pupils equal and reactive,  EOMI, face symmetric, tongue midline, normal sensation over face, Motor: 5/5 in right upper extremity, 4/5 in left upper extremity, minimal antigravity strength in right lower extremity, 2/5 in left lower extremity Gait: not tested  Basic Metabolic Panel: Recent Labs  Lab 05/13/24 0825 05/14/24 0817 05/14/24 2344 05/15/24 1056 05/16/24 0537 05/17/24 0440 05/18/24 0530 05/19/24 0858  NA 142 148* 142 138 137 137 142 145  K 3.3* 3.2* 4.1 4.0 4.4 4.8 4.5 4.6  CL 105 110 107 104 102 103 107 107  CO2 21* 23 22 21* 23 24 24 23   GLUCOSE 211* 181* 301* 360* 220* 249* 88 106*  BUN 23 42* 49* 57* 54* 54* 57* 69*  CREATININE 1.33* 1.53* 1.68* 1.69* 1.43* 1.25* 1.34* 1.51*  CALCIUM  9.4 9.2 8.2* 8.0* 8.0* 8.1* 8.7* 9.2  MG 2.4 2.2 2.1  --  2.2 2.2 2.1  --   PHOS 2.2* 3.1  --   --   --   --  3.6  --     CBC: Recent Labs  Lab 05/13/24 0825 05/14/24 0817 05/14/24 2344 05/15/24 1056 05/16/24 0537 05/17/24 0440 05/18/24 0530 05/19/24 0858  WBC 19.9* 17.3*   < > 15.8* 15.4* 15.2* 18.7* 20.4*  NEUTROABS 17.2* 15.2*  --  14.7*  --   --   --   --   HGB 13.9 13.9   < > 11.5* 11.7* 11.2* 11.8* 12.3  HCT 41.2 41.5   < > 35.7* 35.4* 34.0* 35.0* 35.9*  MCV 89.8 91.4   < > 94.9 92.4 92.4 91.1 90.2  PLT 379 344   < > 299 283 288 391 421*   < > = values in this interval not displayed.     Coagulation Studies: No results for input(s):  LABPROT, INR in the last 72 hours.  Imaging No new brain imaging overnight  ASSESSMENT AND PLAN:78 year old female with recent left frontal infarct, prior multiple strokes, recent UTI on Keflex  who presented with status epilepticus which has since resolved   Focal convulsive status epilepticus, resolved Acute encephalopathy, resolved - Etiology of seizure: Likely in the setting of UTI in a patient with underlying strokes - Continue Keppra  500 mg twice daily.   - Last dose of IV solumedrol today - As needed IV Versed  for clinical seizures - Discussed with ICU team - F/u with neuro in 2-3 months  Delirium - Patient was likely sundowning yesterday evening - Will schedule Seroquel  12.5 mg at 1800.  Also as needed additional dose of 12.5 mg at 2000 if needed - Try to move medications around so that she gets most of her medications by 6 PM - Delirium precautions - Discussed plan with Dr. Fairy via secure chat    I personally spent a total of 36 minutes in the care of the patient today including getting/reviewing separately obtained history, performing a medically appropriate exam/evaluation, counseling and educating, placing orders, referring and communicating with  other health care professionals, documenting clinical information in the EHR, independently interpreting results, and coordinating care.           [name] Epilepsy [facility] For questions after 5pm please refer to AMION to reach the Neurologist on call

## 2024-05-19 NOTE — Care Management Important Message (Signed)
 Important Message  Patient Details  Name: Beth Macdonald MRN: [id number] Date of Birth: Nov 28, 1945   Important Message Given:  Yes - Medicare IM     Claretta Deed 05/19/2024, 4:24 PM

## 2024-05-20 ENCOUNTER — Encounter (HOSPITAL_COMMUNITY): Payer: Self-pay | Admitting: Critical Care Medicine

## 2024-05-20 ENCOUNTER — Encounter (HOSPITAL_COMMUNITY): Payer: Self-pay | Admitting: Physical Medicine and Rehabilitation

## 2024-05-20 ENCOUNTER — Other Ambulatory Visit: Payer: Self-pay

## 2024-05-20 ENCOUNTER — Inpatient Hospital Stay (HOSPITAL_COMMUNITY)
Admission: AD | Admit: 2024-05-20 | Discharge: 2024-05-28 | DRG: 056 | Disposition: A | Discharge status: Skilled Nursing Facility | Payer: MEDICARE | Source: Intra-hospital | Attending: Physical Medicine and Rehabilitation | Admitting: Physical Medicine and Rehabilitation

## 2024-05-20 DIAGNOSIS — I82612 Acute embolism and thrombosis of superficial veins of left upper extremity: Secondary | ICD-10-CM | POA: Diagnosis present

## 2024-05-20 DIAGNOSIS — Z794 Long term (current) use of insulin: Secondary | ICD-10-CM | POA: Diagnosis not present

## 2024-05-20 DIAGNOSIS — R1310 Dysphagia, unspecified: Secondary | ICD-10-CM | POA: Diagnosis present

## 2024-05-20 DIAGNOSIS — Z79899 Other long term (current) drug therapy: Secondary | ICD-10-CM

## 2024-05-20 DIAGNOSIS — Z7985 Long-term (current) use of injectable non-insulin antidiabetic drugs: Secondary | ICD-10-CM

## 2024-05-20 DIAGNOSIS — N189 Chronic kidney disease, unspecified: Secondary | ICD-10-CM | POA: Diagnosis present

## 2024-05-20 DIAGNOSIS — Z781 Physical restraint status: Secondary | ICD-10-CM | POA: Diagnosis not present

## 2024-05-20 DIAGNOSIS — F0390 Unspecified dementia without behavioral disturbance: Secondary | ICD-10-CM | POA: Diagnosis present

## 2024-05-20 DIAGNOSIS — I69398 Other sequelae of cerebral infarction: Principal | ICD-10-CM

## 2024-05-20 DIAGNOSIS — B3731 Acute candidiasis of vulva and vagina: Secondary | ICD-10-CM | POA: Insufficient documentation

## 2024-05-20 DIAGNOSIS — E1169 Type 2 diabetes mellitus with other specified complication: Secondary | ICD-10-CM | POA: Diagnosis present

## 2024-05-20 DIAGNOSIS — J690 Pneumonitis due to inhalation of food and vomit: Secondary | ICD-10-CM | POA: Diagnosis present

## 2024-05-20 DIAGNOSIS — N179 Acute kidney failure, unspecified: Secondary | ICD-10-CM | POA: Diagnosis present

## 2024-05-20 DIAGNOSIS — Z7984 Long term (current) use of oral hypoglycemic drugs: Secondary | ICD-10-CM | POA: Diagnosis not present

## 2024-05-20 DIAGNOSIS — I340 Nonrheumatic mitral (valve) insufficiency: Secondary | ICD-10-CM | POA: Diagnosis present

## 2024-05-20 DIAGNOSIS — M1711 Unilateral primary osteoarthritis, right knee: Secondary | ICD-10-CM | POA: Diagnosis present

## 2024-05-20 DIAGNOSIS — R41841 Cognitive communication deficit: Secondary | ICD-10-CM | POA: Diagnosis not present

## 2024-05-20 DIAGNOSIS — M47816 Spondylosis without myelopathy or radiculopathy, lumbar region: Secondary | ICD-10-CM | POA: Diagnosis not present

## 2024-05-20 DIAGNOSIS — E1140 Type 2 diabetes mellitus with diabetic neuropathy, unspecified: Secondary | ICD-10-CM | POA: Diagnosis present

## 2024-05-20 DIAGNOSIS — Z7982 Long term (current) use of aspirin: Secondary | ICD-10-CM

## 2024-05-20 DIAGNOSIS — Z0189 Encounter for other specified special examinations: Secondary | ICD-10-CM | POA: Diagnosis not present

## 2024-05-20 DIAGNOSIS — R2689 Other abnormalities of gait and mobility: Secondary | ICD-10-CM | POA: Diagnosis not present

## 2024-05-20 DIAGNOSIS — Z806 Family history of leukemia: Secondary | ICD-10-CM

## 2024-05-20 DIAGNOSIS — Z7401 Bed confinement status: Secondary | ICD-10-CM | POA: Diagnosis not present

## 2024-05-20 DIAGNOSIS — R300 Dysuria: Secondary | ICD-10-CM | POA: Diagnosis not present

## 2024-05-20 DIAGNOSIS — M25461 Effusion, right knee: Secondary | ICD-10-CM | POA: Diagnosis not present

## 2024-05-20 DIAGNOSIS — R278 Other lack of coordination: Secondary | ICD-10-CM | POA: Diagnosis not present

## 2024-05-20 DIAGNOSIS — R569 Unspecified convulsions: Secondary | ICD-10-CM

## 2024-05-20 DIAGNOSIS — K5900 Constipation, unspecified: Secondary | ICD-10-CM | POA: Diagnosis not present

## 2024-05-20 DIAGNOSIS — N3289 Other specified disorders of bladder: Secondary | ICD-10-CM | POA: Diagnosis present

## 2024-05-20 DIAGNOSIS — I5022 Chronic systolic (congestive) heart failure: Secondary | ICD-10-CM | POA: Diagnosis present

## 2024-05-20 DIAGNOSIS — Z96651 Presence of right artificial knee joint: Secondary | ICD-10-CM | POA: Diagnosis present

## 2024-05-20 DIAGNOSIS — E0840 Diabetes mellitus due to underlying condition with diabetic neuropathy, unspecified: Secondary | ICD-10-CM | POA: Diagnosis not present

## 2024-05-20 DIAGNOSIS — M25551 Pain in right hip: Secondary | ICD-10-CM | POA: Diagnosis not present

## 2024-05-20 DIAGNOSIS — E876 Hypokalemia: Secondary | ICD-10-CM | POA: Diagnosis present

## 2024-05-20 DIAGNOSIS — Z823 Family history of stroke: Secondary | ICD-10-CM

## 2024-05-20 DIAGNOSIS — R531 Weakness: Secondary | ICD-10-CM | POA: Diagnosis not present

## 2024-05-20 DIAGNOSIS — Z87891 Personal history of nicotine dependence: Secondary | ICD-10-CM

## 2024-05-20 DIAGNOSIS — R339 Retention of urine, unspecified: Secondary | ICD-10-CM | POA: Diagnosis present

## 2024-05-20 DIAGNOSIS — M25559 Pain in unspecified hip: Secondary | ICD-10-CM | POA: Diagnosis not present

## 2024-05-20 DIAGNOSIS — E440 Moderate protein-calorie malnutrition: Secondary | ICD-10-CM | POA: Diagnosis not present

## 2024-05-20 DIAGNOSIS — N390 Urinary tract infection, site not specified: Secondary | ICD-10-CM | POA: Diagnosis not present

## 2024-05-20 DIAGNOSIS — I959 Hypotension, unspecified: Secondary | ICD-10-CM | POA: Diagnosis not present

## 2024-05-20 DIAGNOSIS — I130 Hypertensive heart and chronic kidney disease with heart failure and stage 1 through stage 4 chronic kidney disease, or unspecified chronic kidney disease: Secondary | ICD-10-CM | POA: Diagnosis present

## 2024-05-20 DIAGNOSIS — I10 Essential (primary) hypertension: Secondary | ICD-10-CM | POA: Diagnosis present

## 2024-05-20 DIAGNOSIS — E119 Type 2 diabetes mellitus without complications: Secondary | ICD-10-CM

## 2024-05-20 DIAGNOSIS — G510 Bell's palsy: Secondary | ICD-10-CM | POA: Diagnosis not present

## 2024-05-20 DIAGNOSIS — I5021 Acute systolic (congestive) heart failure: Secondary | ICD-10-CM

## 2024-05-20 DIAGNOSIS — S329XXA Fracture of unspecified parts of lumbosacral spine and pelvis, initial encounter for closed fracture: Secondary | ICD-10-CM | POA: Insufficient documentation

## 2024-05-20 DIAGNOSIS — Z7409 Other reduced mobility: Secondary | ICD-10-CM | POA: Diagnosis present

## 2024-05-20 DIAGNOSIS — M1310 Monoarthritis, not elsewhere classified, unspecified site: Secondary | ICD-10-CM | POA: Diagnosis not present

## 2024-05-20 DIAGNOSIS — Z8249 Family history of ischemic heart disease and other diseases of the circulatory system: Secondary | ICD-10-CM | POA: Diagnosis not present

## 2024-05-20 DIAGNOSIS — I639 Cerebral infarction, unspecified: Secondary | ICD-10-CM

## 2024-05-20 DIAGNOSIS — M25561 Pain in right knee: Secondary | ICD-10-CM | POA: Diagnosis not present

## 2024-05-20 DIAGNOSIS — R609 Edema, unspecified: Secondary | ICD-10-CM | POA: Diagnosis not present

## 2024-05-20 DIAGNOSIS — E1122 Type 2 diabetes mellitus with diabetic chronic kidney disease: Secondary | ICD-10-CM | POA: Diagnosis present

## 2024-05-20 DIAGNOSIS — Z85828 Personal history of other malignant neoplasm of skin: Secondary | ICD-10-CM | POA: Diagnosis not present

## 2024-05-20 DIAGNOSIS — I6930 Unspecified sequelae of cerebral infarction: Secondary | ICD-10-CM | POA: Diagnosis not present

## 2024-05-20 DIAGNOSIS — R1312 Dysphagia, oropharyngeal phase: Secondary | ICD-10-CM | POA: Diagnosis not present

## 2024-05-20 DIAGNOSIS — E870 Hyperosmolality and hypernatremia: Secondary | ICD-10-CM | POA: Diagnosis present

## 2024-05-20 DIAGNOSIS — R9431 Abnormal electrocardiogram [ECG] [EKG]: Secondary | ICD-10-CM

## 2024-05-20 DIAGNOSIS — E782 Mixed hyperlipidemia: Secondary | ICD-10-CM | POA: Diagnosis not present

## 2024-05-20 DIAGNOSIS — Z7902 Long term (current) use of antithrombotics/antiplatelets: Secondary | ICD-10-CM

## 2024-05-20 DIAGNOSIS — G40909 Epilepsy, unspecified, not intractable, without status epilepticus: Principal | ICD-10-CM | POA: Diagnosis present

## 2024-05-20 DIAGNOSIS — E785 Hyperlipidemia, unspecified: Secondary | ICD-10-CM | POA: Diagnosis present

## 2024-05-20 DIAGNOSIS — M6281 Muscle weakness (generalized): Secondary | ICD-10-CM | POA: Diagnosis not present

## 2024-05-20 LAB — CBC WITH DIFFERENTIAL/PLATELET
Abs Immature Granulocytes: 0.37 K/uL — ABNORMAL HIGH (ref 0.00–0.07)
Basophils Absolute: 0 K/uL (ref 0.0–0.1)
Basophils Relative: 0 %
Eosinophils Absolute: 0.1 K/uL (ref 0.0–0.5)
Eosinophils Relative: 1 %
HCT: 37.8 % (ref 36.0–46.0)
Hemoglobin: 12.3 g/dL (ref 12.0–15.0)
Immature Granulocytes: 2 %
Lymphocytes Relative: 9 %
Lymphs Abs: 1.6 K/uL (ref 0.7–4.0)
MCH: 30.7 pg (ref 26.0–34.0)
MCHC: 32.5 g/dL (ref 30.0–36.0)
MCV: 94.3 fL (ref 80.0–100.0)
Monocytes Absolute: 1.4 K/uL — ABNORMAL HIGH (ref 0.1–1.0)
Monocytes Relative: 8 %
Neutro Abs: 14 K/uL — ABNORMAL HIGH (ref 1.7–7.7)
Neutrophils Relative %: 80 %
Platelets: 424 K/uL — ABNORMAL HIGH (ref 150–400)
RBC: 4.01 MIL/uL (ref 3.87–5.11)
RDW: 14.3 % (ref 11.5–15.5)
WBC: 17.4 K/uL — ABNORMAL HIGH (ref 4.0–10.5)
nRBC: 0.3 % — ABNORMAL HIGH (ref 0.0–0.2)

## 2024-05-20 LAB — COMPREHENSIVE METABOLIC PANEL WITH GFR
ALT: 32 U/L (ref 0–44)
AST: 31 U/L (ref 15–41)
Albumin: 2.3 g/dL — ABNORMAL LOW (ref 3.5–5.0)
Alkaline Phosphatase: 49 U/L (ref 38–126)
Anion gap: 17 — ABNORMAL HIGH (ref 5–15)
BUN: 74 mg/dL — ABNORMAL HIGH (ref 8–23)
CO2: 26 mmol/L (ref 22–32)
Calcium: 9.2 mg/dL (ref 8.9–10.3)
Chloride: 105 mmol/L (ref 98–111)
Creatinine, Ser: 1.85 mg/dL — ABNORMAL HIGH (ref 0.44–1.00)
GFR, Estimated: 27 mL/min — ABNORMAL LOW (ref >60)
Glucose, Bld: 180 mg/dL — ABNORMAL HIGH (ref 70–99)
Potassium: 4.3 mmol/L (ref 3.5–5.1)
Sodium: 148 mmol/L — ABNORMAL HIGH (ref 135–145)
Total Bilirubin: 0.6 mg/dL (ref 0.0–1.2)
Total Protein: 5.5 g/dL — ABNORMAL LOW (ref 6.5–8.1)

## 2024-05-20 LAB — BASIC METABOLIC PANEL WITH GFR
Anion gap: 14 (ref 5–15)
BUN: 73 mg/dL — ABNORMAL HIGH (ref 8–23)
CO2: 26 mmol/L (ref 22–32)
Calcium: 9.2 mg/dL (ref 8.9–10.3)
Chloride: 106 mmol/L (ref 98–111)
Creatinine, Ser: 1.63 mg/dL — ABNORMAL HIGH (ref 0.44–1.00)
GFR, Estimated: 32 mL/min — ABNORMAL LOW (ref >60)
Glucose, Bld: 93 mg/dL (ref 70–99)
Potassium: 4 mmol/L (ref 3.5–5.1)
Sodium: 146 mmol/L — ABNORMAL HIGH (ref 135–145)

## 2024-05-20 LAB — GLUCOSE, CAPILLARY
Glucose-Capillary: 135 mg/dL — ABNORMAL HIGH (ref 70–99)
Glucose-Capillary: 110 mg/dL — ABNORMAL HIGH (ref 70–99)
Glucose-Capillary: 157 mg/dL — ABNORMAL HIGH (ref 70–99)
Glucose-Capillary: 147 mg/dL — ABNORMAL HIGH (ref 70–99)
Glucose-Capillary: 291 mg/dL — ABNORMAL HIGH (ref 70–99)

## 2024-05-20 MED ORDER — DIPHENHYDRAMINE HCL 25 MG PO CAPS: 25.0000 mg | ORAL_CAPSULE | Freq: Four times a day (QID) | ORAL | Status: DC | PRN

## 2024-05-20 MED ORDER — ENOXAPARIN SODIUM 30 MG/0.3ML IJ SOSY
30.0000 mg | PREFILLED_SYRINGE | INTRAMUSCULAR | Status: DC
  Administered 2024-05-20: 30 mg via SUBCUTANEOUS
  Filled 2024-05-20: qty 0.3

## 2024-05-20 MED ORDER — LEVETIRACETAM 500 MG PO TABS
500.0000 mg | ORAL_TABLET | Freq: Two times a day (BID) | ORAL | Status: DC
  Administered 2024-05-20 – 2024-05-28 (×16): 500 mg via ORAL
  Filled 2024-05-20 (×16): qty 1

## 2024-05-20 MED ORDER — ALUM & MAG HYDROXIDE-SIMETH 200-200-20 MG/5ML PO SUSP: 30.0000 mL | ORAL | Status: DC | PRN

## 2024-05-20 MED ORDER — ACETAMINOPHEN 325 MG PO TABS
325.0000 mg | ORAL_TABLET | ORAL | Status: DC | PRN
  Administered 2024-05-22: 650 mg via ORAL
  Filled 2024-05-20: qty 2

## 2024-05-20 MED ORDER — SPIRONOLACTONE 25 MG PO TABS: 12.5000 mg | ORAL_TABLET | Freq: Every day | ORAL | Status: AC

## 2024-05-20 MED ORDER — AMOXICILLIN-POT CLAVULANATE 500-125 MG PO TABS
1.0000 | ORAL_TABLET | Freq: Two times a day (BID) | ORAL | Status: DC
  Administered 2024-05-20 – 2024-05-27 (×14): 1 via ORAL
  Filled 2024-05-20 (×15): qty 1

## 2024-05-20 MED ORDER — CLOPIDOGREL BISULFATE 75 MG PO TABS: 75.0000 mg | ORAL_TABLET | Freq: Every day | ORAL | 1 refills | Status: AC

## 2024-05-20 MED ORDER — AMOXICILLIN-POT CLAVULANATE 500-125 MG PO TABS: 1.0000 | ORAL_TABLET | Freq: Two times a day (BID) | ORAL | Status: DC

## 2024-05-20 MED ORDER — NYSTATIN 100000 UNIT/GM EX POWD: Freq: Two times a day (BID) | CUTANEOUS | Status: DC

## 2024-05-20 MED ORDER — INSULIN GLARGINE 100 UNIT/ML ~~LOC~~ SOLN: 8.0000 [IU] | Freq: Two times a day (BID) | SUBCUTANEOUS | Status: AC

## 2024-05-20 MED ORDER — SPIRONOLACTONE 12.5 MG HALF TABLET
12.5000 mg | ORAL_TABLET | Freq: Every day | ORAL | Status: DC
Start: 2024-05-20 — End: 2024-05-20
  Administered 2024-05-20: 12.5 mg via ORAL
  Filled 2024-05-20: qty 1

## 2024-05-20 MED ORDER — ORAL CARE MOUTH RINSE: 15.0000 mL | OROMUCOSAL | Status: DC | PRN

## 2024-05-20 MED ORDER — CHLORHEXIDINE GLUCONATE CLOTH 2 % EX PADS: 6.0000 | MEDICATED_PAD | Freq: Every day | CUTANEOUS | Status: DC

## 2024-05-20 MED ORDER — KATE FARMS STANDARD 1.4 EN LIQD
325.0000 mL | Freq: Two times a day (BID) | ENTERAL | Status: DC
  Administered 2024-05-21 – 2024-05-28 (×14): 325 mL via ORAL
  Filled 2024-05-20 (×18): qty 325

## 2024-05-20 MED ORDER — GUAIFENESIN-DM 100-10 MG/5ML PO SYRP: 5.0000 mL | ORAL_SOLUTION | Freq: Four times a day (QID) | ORAL | Status: DC | PRN

## 2024-05-20 MED ORDER — ADULT MULTIVITAMIN W/MINERALS CH
1.0000 | ORAL_TABLET | Freq: Every day | ORAL | Status: DC
Start: 2024-05-21 — End: 2024-05-28
  Administered 2024-05-21 – 2024-05-28 (×8): 1 via ORAL
  Filled 2024-05-20 (×8): qty 1

## 2024-05-20 MED ORDER — FUROSEMIDE 10 MG/ML IJ SOLN: 40.0000 mg | Freq: Two times a day (BID) | INTRAMUSCULAR | 0 refills | Status: DC

## 2024-05-20 MED ORDER — AMOXICILLIN-POT CLAVULANATE 875-125 MG PO TABS: 1.0000 | ORAL_TABLET | Freq: Two times a day (BID) | ORAL | Status: DC

## 2024-05-20 MED ORDER — INSULIN ASPART 100 UNIT/ML IJ SOLN
0.0000 [IU] | Freq: Every day | INTRAMUSCULAR | Status: DC
  Administered 2024-05-20: 5 [IU] via SUBCUTANEOUS

## 2024-05-20 MED ORDER — MELATONIN 5 MG PO TABS: 5.0000 mg | ORAL_TABLET | Freq: Every evening | ORAL | Status: DC | PRN

## 2024-05-20 MED ORDER — ASPIRIN 81 MG PO CHEW: 81.0000 mg | CHEWABLE_TABLET | Freq: Every day | ORAL | Status: DC

## 2024-05-20 MED ORDER — ATORVASTATIN CALCIUM 10 MG PO TABS: 20.0000 mg | ORAL_TABLET | Freq: Every day | ORAL | Status: DC

## 2024-05-20 MED ORDER — PROCHLORPERAZINE MALEATE 5 MG PO TABS
5.0000 mg | ORAL_TABLET | Freq: Four times a day (QID) | ORAL | Status: DC | PRN
  Administered 2024-05-28: 10 mg via ORAL
  Filled 2024-05-20: qty 2

## 2024-05-20 MED ORDER — SPIRONOLACTONE 12.5 MG HALF TABLET
12.5000 mg | ORAL_TABLET | Freq: Every day | ORAL | Status: DC
Start: 2024-05-21 — End: 2024-05-28
  Administered 2024-05-21 – 2024-05-28 (×7): 12.5 mg via ORAL
  Filled 2024-05-20 (×8): qty 1

## 2024-05-20 MED ORDER — BISACODYL 10 MG RE SUPP: 10.0000 mg | Freq: Every day | RECTAL | Status: DC | PRN

## 2024-05-20 MED ORDER — PROCHLORPERAZINE EDISYLATE 10 MG/2ML IJ SOLN: 5.0000 mg | Freq: Four times a day (QID) | INTRAMUSCULAR | Status: DC | PRN

## 2024-05-20 MED ORDER — LEVETIRACETAM 500 MG PO TABS: 500.0000 mg | ORAL_TABLET | Freq: Two times a day (BID) | ORAL | Status: AC

## 2024-05-20 MED ORDER — PROCHLORPERAZINE 25 MG RE SUPP: 12.5000 mg | Freq: Four times a day (QID) | RECTAL | Status: DC | PRN

## 2024-05-20 MED ORDER — QUETIAPINE FUMARATE 25 MG PO TABS
12.5000 mg | ORAL_TABLET | Freq: Every day | ORAL | Status: DC
  Administered 2024-05-20 – 2024-05-27 (×8): 12.5 mg via ORAL
  Filled 2024-05-20 (×9): qty 1

## 2024-05-20 MED ORDER — LEVETIRACETAM 500 MG PO TABS: 500.0000 mg | ORAL_TABLET | Freq: Two times a day (BID) | ORAL | Status: DC

## 2024-05-20 MED ORDER — FUROSEMIDE 10 MG/ML IJ SOLN
40.0000 mg | Freq: Two times a day (BID) | INTRAMUSCULAR | Status: DC
  Administered 2024-05-20 – 2024-05-24 (×6): 40 mg via INTRAVENOUS
  Filled 2024-05-20 (×8): qty 4

## 2024-05-20 MED ORDER — ASPIRIN 81 MG PO CHEW
81.0000 mg | CHEWABLE_TABLET | Freq: Every day | ORAL | Status: DC
  Administered 2024-05-21 – 2024-05-28 (×8): 81 mg via ORAL
  Filled 2024-05-20 (×8): qty 1

## 2024-05-20 MED ORDER — INSULIN GLARGINE 100 UNIT/ML ~~LOC~~ SOLN
8.0000 [IU] | Freq: Two times a day (BID) | SUBCUTANEOUS | Status: DC
  Administered 2024-05-20 – 2024-05-28 (×14): 8 [IU] via SUBCUTANEOUS
  Filled 2024-05-20 (×17): qty 0.08

## 2024-05-20 MED ORDER — AMOXICILLIN-POT CLAVULANATE 500-125 MG PO TABS
1.0000 | ORAL_TABLET | Freq: Two times a day (BID) | ORAL | Status: DC
Start: 2024-05-20 — End: 2024-05-20
  Administered 2024-05-20: 1 via ORAL
  Filled 2024-05-20 (×2): qty 1

## 2024-05-20 MED ORDER — ATORVASTATIN CALCIUM 10 MG PO TABS
20.0000 mg | ORAL_TABLET | Freq: Every day | ORAL | Status: DC
  Administered 2024-05-21 – 2024-05-28 (×8): 20 mg via ORAL
  Filled 2024-05-20 (×8): qty 2

## 2024-05-20 MED ORDER — ENOXAPARIN SODIUM 30 MG/0.3ML IJ SOSY
30.0000 mg | PREFILLED_SYRINGE | INTRAMUSCULAR | Status: DC
  Administered 2024-05-21 – 2024-05-28 (×8): 30 mg via SUBCUTANEOUS
  Filled 2024-05-20 (×8): qty 0.3

## 2024-05-20 MED ORDER — FLEET ENEMA RE ENEM: 1.0000 | ENEMA | Freq: Once | RECTAL | Status: DC | PRN

## 2024-05-20 MED ORDER — INSULIN ASPART 100 UNIT/ML IJ SOLN
0.0000 [IU] | Freq: Three times a day (TID) | INTRAMUSCULAR | Status: DC
  Administered 2024-05-21: 1 [IU] via SUBCUTANEOUS
  Administered 2024-05-21: 2 [IU] via SUBCUTANEOUS
  Administered 2024-05-22: 1 [IU] via SUBCUTANEOUS
  Administered 2024-05-22 – 2024-05-24 (×6): 2 [IU] via SUBCUTANEOUS
  Administered 2024-05-25 – 2024-05-26 (×3): 1 [IU] via SUBCUTANEOUS
  Administered 2024-05-26: 2 [IU] via SUBCUTANEOUS
  Administered 2024-05-27 (×3): 1 [IU] via SUBCUTANEOUS

## 2024-05-20 MED ORDER — QUETIAPINE FUMARATE 25 MG PO TABS: 12.5000 mg | ORAL_TABLET | Freq: Every day | ORAL | Status: AC

## 2024-05-20 MED ORDER — CLOPIDOGREL BISULFATE 75 MG PO TABS: 75.0000 mg | ORAL_TABLET | Freq: Every day | ORAL | Status: DC

## 2024-05-20 MED ORDER — CLOPIDOGREL BISULFATE 75 MG PO TABS
75.0000 mg | ORAL_TABLET | Freq: Every day | ORAL | Status: DC
  Administered 2024-05-21 – 2024-05-28 (×8): 75 mg via ORAL
  Filled 2024-05-20 (×8): qty 1

## 2024-05-20 MED ORDER — HALOPERIDOL LACTATE 5 MG/ML IJ SOLN: 2.0000 mg | Freq: Four times a day (QID) | INTRAMUSCULAR | Status: DC | PRN

## 2024-05-20 MED ORDER — QUETIAPINE FUMARATE 25 MG PO TABS
12.5000 mg | ORAL_TABLET | Freq: Every evening | ORAL | Status: DC | PRN
  Administered 2024-05-25 – 2024-05-26 (×2): 12.5 mg via ORAL
  Filled 2024-05-20 (×2): qty 1

## 2024-05-20 NOTE — Discharge Summary (Addendum)
 Physician Discharge Summary  Beth Macdonald FMW:[id number] DOB: 03/31/1946 DOA: 05/11/2024  PCP: Clarice Nottingham, MD  Admit date: 05/11/2024 Discharge date: 05/20/2024  Time spent: 45 minutes  Recommendations for Outpatient Follow-up:  CIR for rehabilitation, continue IV Lasix  for 1-2 more days, then switch to oral torsemide 20 mg daily  Discharge Diagnoses:  Principal Problem:   Focal seizures (HCC) Acute encephalopathy UTI Recent CVA Dysphagia Acute hypoxic respiratory failure Aspiration pneumonia Acute systolic CHF   Malnutrition of moderate degree   Hypotension   Acute systolic heart failure (HCC)   ST elevation   Elevated troponin   Discharge Condition: Improving  Diet recommendation: Failure to diet with honey thick liquids  Filed Weights   05/16/24 0500 05/17/24 0440 05/19/24 0430  Weight: 79.4 kg 77.2 kg 75 kg    History of present illness:  78/F w type 2 diabetes mellitus, neuropathy, DVT, fibromuscular hyperplasia, left frontal stroke, admitted on 9/8 with slurred speech focal seizure and right gaze preference, loaded with Keppra , Ativan , CTA head and neck showed right MCA M2 branch occlusion with progression , intubated in the ED for airway protection. - Also treated for UTI day of admission - Felt to have focal status epilepticus secondary to UTI versus autoimmune encephalitis, neurology following, Rx w high-dose steroids and Keppra , along with ABX for UTI. 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 - Concern for aspiration, now n.p.o. 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 Recent left frontal ischemic stroke with residual left-sided weakness -Improved, continue aspirin  Plavix  and statin   Dysphagia - sp MBS> started on dysphagia 2 diet, honey thick liquids, SLP following   Acute respiratory failure with hypoxia Aspiration pneumonia -successfully extubated 9/14, currently on room air Clinically improved Completed 5 days of ceftriaxone  - Subsequently noted to have worsening leukocytosis again, repeat chest x-ray with increased right-sided infiltrate, suspect she may have reaspirated at some point in the last few days, restarted on Augmentin  to complete another 5-day course -SLP following for dysphagia   Acute Systolic Heart Failure acute STEMI 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 Lateral wall, inferior wall, apical segment and apex are hypokinetic Continue aspirin , Plavix  and statin -Cards signed off, not felt to be a candidate for ischemic workup at this time, recommended GDMT when more stable - Picked up per care from ICU team, significantly volume overloaded, improving with diuresis, continue IV Lasix  1-2 more days, switch to oral torsemide 20 mg daily from 9/19, monitor BMP daily -Add low-dose Aldactone    T2DM, complicated with diabetic peripheral neuropathy -Either stable, continue current dose of Lantus , metformin  and semaglutide  on hold   CKD stage IIIb Serum creatinine is at baseline Closely monitor   Moderate Malnutrition  -now w/ dysphagia   Discharge Exam: Vitals:   05/20/24 0806 05/20/24 1136  BP: (!) 157/139 106/73  Pulse: (!) 151 98  Resp: 20 16  Temp: 98.6 F (37 C) 98.6 F (37 C)  SpO2: 96% 95%  Discharge Instructions    Allergies as of 05/20/2024       Reactions   Clindamycin/lincomycin Other (See  Comments)   Cannot tolerate mycins causes C-Diff   Erythromycin    Other Reaction(s): Not available erythromycin   Milk-related Compounds Diarrhea   Tetracyclines & Related Other (See Comments)   Feel really nervous        Medication List     STOP taking these medications    cephALEXin  500 MG capsule Commonly known as: KEFLEX    fluconazole 150 MG tablet Commonly known as: DIFLUCAN   HYDROcodone -acetaminophen  5-325 MG tablet Commonly known as: NORCO/VICODIN   lisinopril  20 MG tablet Commonly known as: ZESTRIL        TAKE these medications    amoxicillin -clavulanate 500-125 MG tablet Commonly known as: AUGMENTIN  Take 1 tablet by mouth every 12 (twelve) hours.   aspirin  EC 81 MG tablet Take 1 tablet (81 mg total) by mouth daily. Swallow whole.   ATHLETES FOOT EX Apply 1 Application topically 2 (two) times daily as needed (Rash).   atorvastatin  20 MG tablet Commonly known as: LIPITOR Take 1 tablet (20 mg total) by mouth daily. Hold until you are taking Paxlovid    clopidogrel  75 MG tablet Commonly known as: PLAVIX  Take 1 tablet (75 mg total) by mouth daily.   donepezil  10 MG tablet Commonly known as: ARICEPT  Take 10 mg by mouth at bedtime.   furosemide  10 MG/ML injection Commonly known as: LASIX  Inject 4 mLs (40 mg total) into the vein 2 (two) times daily.   hydrocortisone  2.5 % cream Apply 1 Application topically 2 (two) times daily as needed (Rash).   insulin  glargine 100 UNIT/ML injection Commonly known as: LANTUS  Inject 0.08 mLs (8 Units total) into the skin 2 (two) times daily.   levETIRAcetam  500 MG tablet Commonly known as: Keppra  Take 1 tablet (500 mg total) by mouth 2 (two) times daily.   metFORMIN  500 MG 24 hr tablet Commonly known as: GLUCOPHAGE -XR Take 1,000 mg by mouth 2 (two) times daily with a meal. What changed: when to take this   QUEtiapine  25 MG tablet Commonly known as: SEROQUEL  Take 0.5 tablets (12.5 mg total) by mouth at  bedtime.   Repatha  SureClick 140 MG/ML Soaj Generic drug: Evolocumab  Inject 140 mg into the skin every 14 (fourteen) days. Sunday   Semaglutide (0.25 or 0.5MG /DOS) 2 MG/3ML Sopn Inject 0.5 mg into the skin once a week. What changed: how much to take   spironolactone  25 MG tablet Commonly known as: ALDACTONE  Take 0.5 tablets (12.5 mg total) by mouth daily.   SYSTANE FREE OP Apply 1 drop to eye daily as needed (dryness/irritation).   Vitamin D3 50 MCG (2000 UT) capsule Take 2,000 Units by mouth daily.        Allergies  Allergen Reactions   Clindamycin/Lincomycin Other (See Comments)    Cannot tolerate mycins causes C-Diff   Erythromycin     Other Reaction(s): Not available  erythromycin   Milk-Related Compounds Diarrhea   Tetracyclines & Related Other (See Comments)    Feel really nervous      The results of significant diagnostics from this hospitalization (including imaging, microbiology, ancillary and laboratory) are listed below for reference.    Significant Diagnostic Studies: DG CHEST PORT 1 VIEW Result Date: 05/19/2024 EXAM: 1 VIEW XRAY OF THE CHEST 05/19/2024 01:52:00 PM COMPARISON: 05/14/2024 CLINICAL HISTORY: Hypoxia 200808. Reason for exam: hypoxia ; Best obtainable due to pts position FINDINGS: LINES, TUBES AND DEVICES: Endotracheal tube, enteric  tube and left internal jugular central venous catheter removed. Left chest cardiac loop recorder noted. LUNGS AND PLEURA: Low lung volumes with bronchovascular crowding. Increased right basilar opacity. Small right pleural effusion. HEART AND MEDIASTINUM: No acute abnormality of the cardiac and mediastinal silhouettes. BONES AND SOFT TISSUES: No acute osseous abnormality. IMPRESSION: 1. Increased right basilar opacity and small right pleural effusion. 2. Low lung volumes with bronchovascular crowding. Electronically signed by: Donnice Mania MD 05/19/2024 04:34 PM EDT RP Workstation: [id number]   DG Swallowing  Func-Speech Pathology Result Date: 05/19/2024 Table formatting from the original result was not included. Modified Barium Swallow Study Patient Details Name: Beth Macdonald MRN: [id number] Date of Birth: 28-Oct-1945 Today's Date: 05/19/2024 HPI/PMH: HPI: Pt is an 78 yo female presenting to ED 9/8 with L sided weakness and seizure activity after discharging home earlier in the day from [facility] with back pain and UTI. ETT 9/8-9/14. LP 9/9. 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 PMH includes HLD, CVA, TIA, Bell's palsy, DVT, T2DM c/b neuropathy, SCC Clinical Impression: Clinical Impression: Pt exhibits severe oropharyngeal dysphagia primarily characterized by deficits related to cognitive factors and subsequent mistiming. This leads to penetration before the swallow with thin and nectar thick liquids resulting in aspiration during the swallow (PAS 7). Pt's spontaneous cough is ineffective, containing significant voicing and limited force. She is unable to follow commands to try an oral hold or supraglottic swallow. Timing improves with honey thick liquids, contibuting to improved airway protection. Although difficult to visualize due to her positioning, PES opening appears minimal though complete esophageal clearance is achieved. Mastication of regular solids was minimal with swallow initiation of unchewed cracker observed. Recommend diet of Dys 2 solids with honey thick liquids. SLP will continue following to target RMT and use of strategies. Factors that may increase risk of adverse event in presence of aspiration Noe & Lianne 2021): Factors that may increase risk of adverse event in presence of aspiration Noe & Lianne 2021): Poor general health and/or compromised immunity; Reduced cognitive function; Limited mobility; Reduced saliva; Weak cough Recommendations/Plan: Swallowing Evaluation Recommendations Swallowing Evaluation Recommendations  Recommendations: PO diet PO Diet Recommendation: Dysphagia 2 (Finely chopped); Moderately thick liquids (Level 3, honey thick) Liquid Administration via: Cup; Straw Medication Administration: Crushed with puree Supervision: Staff to assist with self-feeding; Full supervision/cueing for swallowing strategies Swallowing strategies  : Minimize environmental distractions; Slow rate; Small bites/sips Postural changes: Position pt fully upright for meals; Stay upright 30-60 min after meals Oral care recommendations: Oral care QID (4x/day) Caregiver Recommendations: Avoid jello, ice cream, thin soups, popsicles; Remove water  pitcher Treatment Plan Treatment Plan Treatment recommendations: Therapy as outlined in treatment plan below Follow-up recommendations: Acute inpatient rehab (3 hours/day) Functional status assessment: Patient has had a recent decline in their functional status and demonstrates the ability to make significant improvements in function in a reasonable and predictable amount of time. Treatment frequency: Min 2x/week Treatment duration: 2 weeks Interventions: Aspiration precaution training; Patient/family education; Trials of upgraded texture/liquids; Diet toleration management by SLP; Respiratory muscle strength training Recommendations Recommendations for follow up therapy are one component of a multi-disciplinary discharge planning process, led by the attending physician.  Recommendations may be updated based on patient status, additional functional criteria and insurance authorization. Assessment: Orofacial Exam: Orofacial Exam Oral Cavity: Oral Hygiene: WFL Oral Cavity - Dentition: Adequate natural dentition Orofacial Anatomy: WFL Oral Motor/Sensory Function: WFL Anatomy: Anatomy: Suspected cervical osteophytes Boluses Administered: Boluses Administered Boluses Administered: Thin liquids (  Level 0); Mildly thick liquids (Level 2, nectar thick); Moderately thick liquids (Level 3, honey thick); Puree;  Solid  Oral Impairment Domain: Oral Impairment Domain Lip Closure: Escape progressing to mid-chin Tongue control during bolus hold: Not tested Bolus preparation/mastication: Disorganized chewing/mashing with solid pieces of bolus unchewed Bolus transport/lingual motion: Brisk tongue motion Oral residue: Residue collection on oral structures Location of oral residue : Tongue; Palate Initiation of pharyngeal swallow : Pyriform sinuses  Pharyngeal Impairment Domain: Pharyngeal Impairment Domain Soft palate elevation: No bolus between soft palate (SP)/pharyngeal wall (PW) Laryngeal elevation: Partial superior movement of thyroid  cartilage/partial approximation of arytenoids to epiglottic petiole Anterior hyoid excursion: Complete anterior movement Epiglottic movement: Complete inversion Laryngeal vestibule closure: Complete, no air/contrast in laryngeal vestibule Pharyngeal stripping wave : Present - complete Pharyngeal contraction (A/P view only): N/A Pharyngoesophageal segment opening: Partial distention/partial duration, partial obstruction of flow Tongue base retraction: No contrast between tongue base and posterior pharyngeal wall (PPW) Pharyngeal residue: Trace residue within or on pharyngeal structures Location of pharyngeal residue: Tongue base; Valleculae  Esophageal Impairment Domain: Esophageal Impairment Domain Esophageal clearance upright position: Complete clearance, esophageal coating Pill: No data recorded Penetration/Aspiration Scale Score: Penetration/Aspiration Scale Score 1.  Material does not enter airway: Puree; Solid; Moderately thick liquids (Level 3, honey thick) 7.  Material enters airway, passes BELOW cords and not ejected out despite cough attempt by patient: Thin liquids (Level 0); Mildly thick liquids (Level 2, nectar thick) Compensatory Strategies: Compensatory Strategies Compensatory strategies: Yes Supraglottic swallow: Ineffective Ineffective Supraglottic Swallow: Mildly thick liquid  (Level 2, nectar thick)   General Information: Caregiver present: No  Diet Prior to this Study: NPO   Temperature : Normal   Respiratory Status: WFL   Supplemental O2: None (Room air)   History of Recent Intubation: Yes  Behavior/Cognition: Alert; Cooperative Self-Feeding Abilities: Needs assist with self-feeding Baseline vocal quality/speech: Dysphonic Volitional Cough: Able to elicit Volitional Swallow: Able to elicit Exam Limitations: Poor positioning Goal Planning: Prognosis for improved oropharyngeal function: Good Barriers to Reach Goals: Cognitive deficits; Time post onset No data recorded Patient/Family Stated Goal: none stated Consulted and agree with results and recommendations: Patient; Nurse Pain: Pain Assessment Pain Assessment: No/denies pain End of Session: Start Time:SLP Start Time (ACUTE ONLY): 1042 Stop Time: SLP Stop Time (ACUTE ONLY): 1100 Time Calculation:SLP Time Calculation (min) (ACUTE ONLY): 18 min Charges: SLP Evaluations $ SLP Speech Visit: 1 Visit SLP Evaluations $MBS Swallow: 1 Procedure $ SLP EVAL LANGUAGE/SOUND PRODUCTION: 1 Procedure $Swallowing Treatment: 1 Procedure SLP visit diagnosis: SLP Visit Diagnosis: Dysphagia, oropharyngeal phase (R13.12) Past Medical History: Past Medical History: Diagnosis Date  Aortic atherosclerosis (HCC)   Diabetes mellitus without complication (HCC)   Type 2  Diabetic neuropathy (HCC)   DVT of proximal lower limb (HCC)   Facial paralysis/Bells palsy   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HCC)   Hyperlipidemia   Mitral valve insufficiency   Osteoarthritis   SCCA (squamous cell carcinoma) of skin 03/22/2021  Bridge of nose (in situ)  Skull fracture (HCC)   age 57  Squamous cell carcinoma of skin 03/22/2021  Right forehead (in situ)  TIA (transient ischemic attack) 2020  Varicose vein of leg  Past Surgical History: Past Surgical History: Procedure Laterality Date  ELBOW SURGERY Bilateral   20 yrs ago  ENDOVENOUS ABLATION SAPHENOUS VEIN W/ LASER Bilateral    ENDOVENOUS ABLATION SAPHENOUS VEIN W/ LASER Left 11/19/2019  endovenous laser ablation anterior accessory branch of left GSV and stab phlebectomy> 20 incisions left leg by [place]  Eliza MD   shoulder Bilateral   [id number]  TOENAIL EXCISION Right 2019  Big toe  TONSILLECTOMY Bilateral   TOTAL KNEE ARTHROPLASTY Right 01/01/2022  Procedure: TOTAL KNEE ARTHROPLASTY;  Surgeon: Melodi Lerner, MD;  Location: [facility];  Service: Orthopedics;  Laterality: Right; Damien Blumenthal, M.A., CCC-SLP Speech Language Pathology, Acute Rehabilitation Services Secure Chat preferred [phone] 05/19/2024, 12:48 PM  ECHOCARDIOGRAM LIMITED Result Date: 05/16/2024    ECHOCARDIOGRAM LIMITED REPORT   Patient Name:   Janaya G Brodhead Date of Exam: 05/16/2024 Medical Rec #:  [id number]         Height:       68.0 in Accession #:    [id number]        Weight:       175.0 lb Date of Birth:  09/25/1945         BSA:          1.931 m Patient Age:    78 years          BP:           105/56 mmHg Patient Gender: F                 HR:           83 bpm. Exam Location:  Inpatient Procedure: Limited Echo, Cardiac Doppler, Limited Color Doppler and Intracardiac            Opacification Agent (Both Spectral and Color Flow Doppler were            utilized during procedure). Indications:    Elevated Troponin  History:        Patient has prior history of Echocardiogram examinations, most                 recent 03/15/2024. DVT and TIA; Risk Factors:Dyslipidemia,                 Current Smoker and Diabetes.  Sonographer:    Logan Shove RDCS Referring Phys: [id number] LONNI CR[facility]  Sonographer Comments: Technically challenging study due to limited acoustic windows. IMPRESSIONS  1. Limited Echocardiogram - see study dated 03/15/2024 for additional details.  2. Left ventricular ejection fraction, by estimation, is 25 to 30%. The left ventricle has severely decreased function. The left ventricle demonstrates regional wall motion abnormalities (see scoring  diagram/findings for description). The anterior wall and entire septum are akinetic. The entire lateral wall, entire inferior wall, apical anterior segment, and apex are hypokinetic.  3. Right ventricular systolic function is normal. The right ventricular size is normal.  4. The mitral valve is degenerative. Moderate mitral valve regurgitation. Comparison(s): A prior study was performed on 03/15/2024. LVEF 60-65%, no RWMA, G1DD, mild PASP , RAP . Change in LVEF and RWMA are new comapred to prior study. FINDINGS  Left Ventricle: Left ventricular ejection fraction, by estimation, is 25 to 30%. The left ventricle has severely decreased function. The left ventricle demonstrates regional wall motion abnormalities. The left ventricular internal cavity size was normal  in size. There is no left ventricular hypertrophy.  LV Wall Scoring: The anterior wall and entire septum are akinetic. The entire lateral wall, entire inferior wall, apical anterior segment, and apex are hypokinetic. The anterior wall and entire septum are akinetic. The entire lateral wall, entire inferior wall, apical anterior segment, and apex are hypokinetic. Right Ventricle: The right ventricular size is normal. Right ventricular systolic function is normal. Left Atrium: Left atrial size was normal in size. Right Atrium: Right  atrial size was normal in size. Mitral Valve: The mitral valve is degenerative in appearance. Moderate mitral valve regurgitation. Tricuspid Valve: The tricuspid valve is grossly normal. Tricuspid valve regurgitation is mild . No evidence of tricuspid stenosis. Aorta: The aortic root is normal in size and structure. Venous: IVC assessment for right atrial pressure unable to be performed due to mechanical ventilation. IAS/Shunts: The interatrial septum was not well visualized. LEFT VENTRICLE PLAX 2D LVIDd:         4.40 cm      Diastology LVIDs:         4.00 cm      LV e' medial:    5.77 cm/s LV PW:         0.90 cm      LV  E/e' medial:  9.2 LV IVS:        1.20 cm      LV e' lateral:   7.83 cm/s                             LV E/e' lateral: 6.8  LV Volumes (MOD) LV vol d, MOD A2C: 142.0 ml LV vol d, MOD A4C: 177.0 ml LV vol s, MOD A2C: 110.0 ml LV vol s, MOD A4C: 137.0 ml LV SV MOD A2C:     32.0 ml LV SV MOD A4C:     177.0 ml LV SV MOD BP:      35.5 ml RIGHT VENTRICLE          IVC RV Basal diam:  2.60 cm  IVC diam: 1.80 cm LEFT ATRIUM             Index        RIGHT ATRIUM          Index LA diam:        2.40 cm 1.24 cm/m   RA Area:     6.78 cm LA Vol (A2C):   76.7 ml 39.72 ml/m  RA Volume:   10.10 ml 5.23 ml/m LA Vol (A4C):   37.8 ml 19.57 ml/m LA Biplane Vol: 55.9 ml 28.95 ml/m   AORTA Ao Root diam: 2.20 cm MITRAL VALVE MV Area (PHT): 4.89 cm MV Decel Time: 155 msec MV E velocity: 52.90 cm/s MV A velocity: 81.80 cm/s MV E/A ratio:  0.65 Sunit Tolia Electronically signed by Madonna Large Signature Date/Time: 05/16/2024/3:59:38 PM    Final    Overnight EEG with video Result Date: 05/16/2024 Shelton Arlin KIDD, MD     05/17/2024  7:12 AM Patient Name: Beth Macdonald MRN: [id number] Epilepsy Attending: Arlin KIDD Shelton Referring Physician/Provider: Shelton Arlin KIDD, MD Duration: 05/15/2024 1229 to 05/16/2024 1229  Patient history: 78 y.o. 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  EEG to evaluate for seizure  Level of alertness: lethargic  AEDs during EEG study:  LEV  Technical aspects: This EEG study was done with scalp electrodes positioned according to the 10-20 International system of electrode placement. Electrical activity was reviewed with band pass filter of 1-70Hz , sensitivity of 7 uV/mm, display speed of 5mm/sec with a 60Hz  notched filter applied as appropriate. EEG data were recorded continuously and digitally stored.  Video  monitoring was available and reviewed as appropriate.  Description: EEG showed continuous generalized and lateralized  right hemisphere 3 to 6 Hz theta-delta slowing with overriding 13 to 15 Hz beta activity. Sharp waves were noted in right hemisphere. Hyperventilation and photic stimulation were not performed.    ABNORMALITY - Sharp waves, left hemisphere - Continuous slow, generalized and lateralized right hemisphere  IMPRESSION: This study showed evidence of epileptogenicity and cortical dysfunction arising from right hemisphere, likely secondary to underlying structural abnormality.  Additionally there is a diffuse encephalopathy.  No seizures were noted during the study.  Arlin [name]   DG CHEST PORT 1 VIEW Result Date: 05/15/2024 EXAM: 1 VIEW XRAY OF THE CHEST 05/14/2024 11:58:00 PM COMPARISON: 05/12/2024 CLINICAL HISTORY: PICC (peripherally inserted central catheter) in place FINDINGS: LUNGS AND PLEURA: No focal pulmonary opacity. No pulmonary edema. No pleural effusion. No pneumothorax. HEART AND MEDIASTINUM: No acute abnormality of the cardiac and mediastinal silhouettes. Left chest cardiac loop recorder. BONES AND SOFT TISSUES: No acute osseous abnormality. Left internal jugular central venous catheter in place with tip at superior cavoatrial junction. Enteric tube in gastric fundus. Endotracheal tube in place with tip in good position. IMPRESSION: No acute process. Lines and tubes as described. No pneumothorax. Electronically signed by: Norman Gatlin MD 05/15/2024 12:13 AM EDT RP Workstation: [id number]   MR BRAIN W WO CONTRAST Result Date: 05/13/2024 EXAM: MRI BRAIN WITH AND WITHOUT CONTRAST 05/13/2024 03:47:05 PM TECHNIQUE: Multiplanar multisequence MRI of the head/brain was performed with and without the administration of intravenous contrast. COMPARISON: MR head 05/12/2024. CLINICAL HISTORY: Seizure disorder, clinical change. FINDINGS: BRAIN AND VENTRICLES: Abnormal diffusion trace signal is  again noted within the mesial right temporal lobe and hippocampus. T2 and FLAIR signal within the mesial right temporal lobe and hippocampus is more pronounced than on the prior scan. A remote infarct is present in the anterior left frontal lobe. Remote medial frontal and parietal lobe infarcts are stable. Moderate generalized atrophy and periventricular white matter changes otherwise stable. No acute intracranial hemorrhage. No mass effect or midline shift. No hydrocephalus. The sella is unremarkable. Normal flow voids. No pathologic enhancement is present. ORBITS: No acute abnormality. SINUSES: Fluid in the nasopharynx is likely related to intubation. BONES AND SOFT TISSUES: Small mastoid effusions are present bilaterally. No acute soft tissue abnormality. IMPRESSION: 1. Abnormal diffusion trace signal within the mesial right temporal lobe and hippocampus, with more pronounced T2 and FLAIR signal than on the prior scan. The finding is likely related to seizure activity. Neurocontrast enhancement is present to suggest an infectious etiology. 2. Stable remote cortical infarcts as described above 3. No pathologic enhancement. Electronically signed by: Lonni Necessary MD 05/13/2024 03:57 PM EDT RP Workstation: [id number]   Overnight EEG with video Result Date: 05/12/2024 Krebs Arlin MALVA, MD     05/13/2024 10:44 AM Patient Name: FLORENE BRILL MRN: [id number] Epilepsy Attending: Arlin [name] Referring Physician/Provider: Khaliqdina, Salman, MD Duration: 05/12/2024 0206 to 05/13/2024 0206 Patient history: 78 y.o. 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  EEG to evaluate for seizure Level of alertness: comatose/ lethargic AEDs during EEG study: Propofol , LEV Technical aspects: This EEG study was done with scalp  electrodes positioned according to the 10-20 International system of electrode placement. Electrical activity was reviewed with band pass filter of 1-70Hz , sensitivity of 7 uV/mm, display speed of 59mm/sec with a 60Hz  notched filter applied as appropriate. EEG data were  recorded continuously and digitally stored.  Video monitoring was available and reviewed as appropriate. Description: EEG showed continuous generalized and lateralized right hemisphere 3 to 6 Hz theta-delta slowing with overriding 13 to 15 Hz beta activity. Lateralized periodic discharges were noted in right hemisphere at 0.5 to 1.5 Hz. Hyperventilation and photic stimulation were not performed.   ABNORMALITY - Lateralized periodic discharges ( LPD ) left  hemisphere - Continuous slow, generalized and lateralized right hemisphere IMPRESSION: This study showed evidence of epileptogenicity and cortical dysfunction arising from right hemisphere, likely secondary to underlying structural abnormality.  Additionally there is a diffuse encephalopathy, likely related to sedation.  No seizures were noted during the study. Arlin [name]   DG Chest Port 1 View Result Date: 05/12/2024 CLINICAL DATA:  78 year old female intubated. Code stroke presentation, seizure. EXAM: PORTABLE CHEST 1 VIEW COMPARISON:  Portable chest yesterday and earlier. FINDINGS: Portable AP semi upright view at 0521 hours. Endotracheal tube tip in good position between the clavicles and carina. Enteric tube loops in the left upper quadrant, stomach as before. Left chest cardiac loop recorder or superficial ICD is stable. Mildly lower lung volumes. Allowing for portable technique the lungs are clear. Asymmetric deep sulcus on the left, stable and no pneumothorax identified. Cardiac size and mediastinal contours are stable and within normal limits. No acute osseous abnormality identified. Negative visible bowel gas. IMPRESSION: 1. Satisfactory ET tube and enteric tube. 2. No acute  cardiopulmonary abnormality identified. Electronically Signed   By: VEAR Hurst M.D.   On: 05/12/2024 05:57   MR BRAIN WO CONTRAST Result Date: 05/12/2024 CLINICAL DATA:  Initial evaluation for acute neuro deficit, stroke suspected. EXAM: MRI HEAD WITHOUT CONTRAST TECHNIQUE: Multiplanar, multiecho pulse sequences of the brain and surrounding structures were obtained without intravenous contrast. COMPARISON:  Comparison made with CTs from 05/11/2024 as well as MRI from 05/06/2024. FINDINGS: Brain: Diffuse prominence of the CSF containing spaces compatible generalized cerebral atrophy. Patchy and confluent T2/FLAIR hyperintensity involving the periventricular deep white matter both cerebral hemispheres, consistent with chronic small vessel ischemic disease. Multiple scattered cortical subcortical infarcts are seen involving the bilateral frontal, right parietal, and right occipital lobes. Small remote lacunar infarct at the right thalamus. Few scattered tiny remote cerebellar infarcts noted. Associated mild chronic hemosiderin staining at the right occipital lobe. Abnormal diffusion signal abnormality seen involving the mesial right temporal lobe/hippocampal formation (series 5, image 72). Associated mildly increased T2/FLAIR signal intensity. No associated hemorrhage or significant mass effect. Finding is nonspecific. No other evidence for acute or subacute ischemia elsewhere within the brain. No acute intracranial hemorrhage. Few additional scattered chronic micro hemorrhages noted, likely small vessel/hypertensive in nature. No mass lesion, midline shift, or significant mass effect. No hydrocephalus or extra-axial fluid collection. Pituitary gland within normal limits. Vascular: Major intracranial vascular flow voids are maintained. Skull and upper cervical spine: Craniocervical junction within normal limits. Bone marrow signal intensity overall within normal limits. No scalp soft tissue abnormality. Sinuses/Orbits:  Prior bilateral ocular lens replacement. Mild scattered mucosal thickening about the ethmoidal air cells and maxillary sinuses. No significant mastoid effusion. Other: None. IMPRESSION: 1. Abnormal diffusion signal abnormality involving the mesial right temporal lobe/right hippocampal formation. Finding is nonspecific, but favored to reflect changes related to seizure/postictal changes. Sequelae of acute ischemia or encephalitis, including herpes encephalitis, would be the primary differential considerations. 2. No other acute intracranial abnormality. 3. Underlying age-related cerebral atrophy with chronic small vessel ischemic disease, with multiple scattered chronic ischemic infarcts as above. Electronically Signed  By: Morene Hoard M.D.   On: 05/12/2024 00:29   DG Abd 1 View Result Date: 05/11/2024 EXAM: 1 VIEW XRAY OF THE ABDOMEN 05/11/2024 09:44:00 PM COMPARISON: In the CT abdomen and pelvis CLINICAL HISTORY: Encounter for orogastric tube placement. FINDINGS: BOWEL: Mildly dilated air-filled small bowel loops in the left mid abdomen up to 3.3 cm in diameter. SOFT TISSUES: Left chest loop recorder device noted. BONES: No acute osseous abnormality. LINES AND TUBES: Enteric tube in place with tip in the gastric fundus. IMPRESSION: 1. Enteric tube in place with tip in the gastric fundus. Electronically signed by: Norman Gatlin MD 05/11/2024 10:02 PM EDT RP Workstation: [id number]   DG Chest 1 View Result Date: 05/11/2024 CLINICAL DATA:  ETT placement EXAM: CHEST  1 VIEW COMPARISON:  April 12, 2024 FINDINGS: Well-positioned endotracheal tube terminating in the mid trachea. Esophagogastric tube terminates in the stomach. Cardiac loop recorder device. No focal airspace consolidation, pleural effusion, or pneumothorax. No cardiomegaly. No acute fracture or destructive lesions. Multilevel thoracic osteophytosis. IMPRESSION: Well-positioned support tubes, as delineated above. No pneumothorax. Otherwise,  No pneumonia or pulmonary edema. Electronically Signed   By: Rogelia Myers M.D.   On: 05/11/2024 19:08   CT ANGIO HEAD NECK W WO CM (CODE STROKE) Addendum Date: 05/11/2024 ADDENDUM: Short-segment occlusion versus severe stenosis of proximal M2 branch of the right MCA which is progressed since 03/14/14 CTA. The occluded/stenosed portion of the vessel extends over a length of at most 1.54mm. The vessel immediately reconstitutes distal to this point. Discussed with Dr. Vanessa at 7:01PM on 05/11/24. ---------------------------------------------------- Electronically signed by: Donnice Mania MD 05/11/2024 07:09 PM EDT RP Workstation: [id number]   Result Date: 05/11/2024 EXAM: CTA Head and Neck with Intravenous Contrast. CT Head without Contrast. CLINICAL HISTORY: Neuro deficit, acute, stroke suspected. Left side weakness, seizures. TECHNIQUE: Axial CTA images of the head and neck performed with intravenous contrast. MIP reconstructed images were created and reviewed. Axial computed tomography images of the head/brain performed without intravenous contrast. Note: Per PQRS, the description of internal carotid artery percent stenosis, including 0 percent or normal exam, is based on [place] American Symptomatic Carotid Endarterectomy Trial (NASCET) criteria. Dose reduction technique was used including one or more of the following: automated exposure control, adjustment of mA and kV according to patient size, and/or iterative reconstruction. CONTRAST: 75 mL of iohexol  (OMNIPAQUE ) 350 MG/ML injection. COMPARISON: Same day CT head and MRI head. FINDINGS: CT HEAD: BRAIN: No acute intraparenchymal hemorrhage. No mass lesion. No CT evidence for acute territorial infarct. No midline shift or extra-axial collection. VENTRICLES: No hydrocephalus. ORBITS: The orbits are unremarkable. SINUSES AND MASTOIDS: The paranasal sinuses and mastoid air cells are clear. CTA NECK: COMMON CAROTID ARTERIES: No significant stenosis. No dissection  or occlusion. INTERNAL CAROTID ARTERIES: Mild atherosclerosis along the proximal right and left cervical internal carotid arteries without hemodynamically significant stenosis. No dissection or occlusion. VERTEBRAL ARTERIES: No significant stenosis. No dissection or occlusion. CTA HEAD: ANTERIOR CEREBRAL ARTERIES: The anterior cerebral arteries are patent bilaterally. No stenosis. No occlusion. No aneurysm. MIDDLE CEREBRAL ARTERIES: The right M1 segment is patent. Similar severe stenosis of the distal right M1 segment with occlusion of a proximal M2 branch of the right MCA. The left MCA is patent with moderate-to-severe stenosis of M2 and M3 branches. POSTERIOR CEREBRAL ARTERIES: Mild-to-moderate stenosis of the P2 segment of the right PCA. Similar severe stenosis of the P1 and proximal P2 segments of the left PCA with occlusion of the anterior P2 segment. Reconstitution along the  mid and posterior portion of the P2 segment of the left PCA with patent distal left PCA branches. BASILAR ARTERY: No significant stenosis. No occlusion. No aneurysm. OTHER: Common origin of the brachiocephalic and left common carotid arteries. Minimal atherosclerosis of the visualized aortic arch. SOFT TISSUES: No acute finding. No masses or lymphadenopathy. BONES: Degenerative changes in the visualized spine with disc osteophyte complex most pronounced at C4-5. ARTERIES: The superior cerebellar arteries are patent bilaterally. IMPRESSION: 1. New occlusion of a proximal M2 branch of the right MCA. 2. Severe stenosis of the distal right M1 segment similar to prior. 3. Moderate-to-severe stenosis of M2 and M3 branches of the left MCA, similar to prior. 4. Severe stenosis of the P1 and proximal P2 segments of the left PCA with occlusion of the anterior P2 segment, similar to prior. Reconstitution along the mid and posterior portion of the P2 segment with patent distal branches. 5. Finding of right M2 occlusion messaged to Dr. Vanessa at  6:56PM on 05/11/24. Electronically signed by: Donnice Mania MD 05/11/2024 06:57 PM EDT RP Workstation: [id number]   CT HEAD CODE STROKE WO CONTRAST Result Date: 05/11/2024 EXAM: CT HEAD WITHOUT CONTRAST 05/11/2024 06:08:16 PM TECHNIQUE: CT of the head was performed without the administration of intravenous contrast. Automated exposure control, iterative reconstruction, and/or weight based adjustment of the mA/kV was utilized to reduce the radiation dose to as low as reasonably achievable. COMPARISON: MRI head 05/06/2024 CLINICAL HISTORY: Neuro deficit, acute, stroke suspected. Left side weakness, seizure. FINDINGS: BRAIN AND VENTRICLES: No acute intracranial hemorrhage. Nonspecific hypoattenuation in the periventricular and subcortical white matter, most likely representing chronic small vessel disease. Mild parenchymal volume loss. Encephalomalacia in the left frontal lobe and within the right parietal occipital lobes compatible with remote infarcts. ORBITS: Bilateral lens replacement. SINUSES: Mucosal thickening in the right ethmoid sinus. Mucous retention cyst in the right maxillary sinus. SOFT TISSUES AND SKULL: No acute soft tissue abnormality. No skull fracture. [place] stroke program early CT (ASPECT) score: Ganglionic (caudate, IC, lentiform nucleus, insula, M1-M3): 7 Supraganglionic (M4-M6): 3 Total: 10 IMPRESSION: 1. No acute intracranial hemorrhage. 2. Chronic small vessel disease. 3. Mild parenchymal volume loss. 4. Encephalomalacia in the left frontal lobe and within the right parietal occipital lobes compatible with remote infarcts. 5. ASPECT score: 10. 6. Findings messaged to Dr. Vanessa at [facility] on 05/11/24. Electronically signed by: Donnice Mania MD 05/11/2024 06:23 PM EDT RP Workstation: [id number]   CUP PACEART REMOTE DEVICE CHECK Result Date: 05/11/2024 ILR summary report received. Battery status OK. Normal device function. No new symptom, tachy, brady, or pause episodes. No new AF episodes.  Presenting EGM ST 130's. Monthly summary reports and ROV/PRN. MC, CVRS  CT Renal Stone Study Result Date: 05/11/2024 CLINICAL DATA:  Recently diagnosed UTI with persistent burning with urination and lower back pain. EXAM: CT ABDOMEN AND PELVIS WITHOUT CONTRAST TECHNIQUE: Multidetector CT imaging of the abdomen and pelvis was performed following the standard protocol without IV contrast. RADIATION DOSE REDUCTION: This exam was performed according to the departmental dose-optimization program which includes automated exposure control, adjustment of the mA and/or kV according to patient size and/or use of iterative reconstruction technique. COMPARISON:  March 03, 2007 FINDINGS: Lower chest: A stable, benign 5 mm anterior right middle lobe pulmonary nodule is seen. Hepatobiliary: No focal liver abnormality is seen. No gallstones, gallbladder wall thickening, or biliary dilatation. Pancreas: Unremarkable. No pancreatic ductal dilatation or surrounding inflammatory changes. Spleen: Normal in size without focal abnormality. Adrenals/Urinary Tract: Adrenal glands are unremarkable. Kidneys  are normal in size, without renal calculi, focal lesion, or hydronephrosis. Mild urinary bladder wall thickening is seen along the anterior aspect of the bladder dome. A mild amount of adjacent inflammatory fat stranding is present (coronal reformatted images 36 through 52, CT series 6). Stomach/Bowel: Stomach is within normal limits. Appendix appears normal. No evidence of bowel wall thickening, distention, or inflammatory changes. Noninflamed diverticula are seen throughout the sigmoid colon. Vascular/Lymphatic: Aortic atherosclerosis. No enlarged abdominal or pelvic lymph nodes. Reproductive: Uterus and bilateral adnexa are unremarkable. Other: No abdominal wall hernia or abnormality. No abdominopelvic ascites. Musculoskeletal: Multilevel degenerative changes are seen throughout the thoracic spine IMPRESSION: 1. Mild urinary bladder  wall thickening with adjacent inflammatory fat stranding, consistent with cystitis. Correlation with urinalysis is recommended. 2. Sigmoid diverticulosis. 3. Aortic atherosclerosis. Electronically Signed   By: Suzen Dials M.D.   On: 05/11/2024 12:47   MR BRAIN WO CONTRAST Result Date: 05/06/2024 EXAM: MRI BRAIN WITHOUT CONTRAST 05/06/2024 03:57:00 AM TECHNIQUE: Multiplanar multisequence MRI of the head/brain was performed without the administration of intravenous contrast. COMPARISON: 04/11/2024 CLINICAL HISTORY: Neuro deficit, acute, stroke suspected. FINDINGS: BRAIN AND VENTRICLES: Right greater than left occipital encephalomalacia. Old infarct of the left frontal operculum. Mild generalized volume loss. Subcortical chronic infarcts within the medial right frontal and parietal lobes. Multifocal hyperintense T2-weighted signal within the cerebral white matter, most commonly due to chronic small vessel disease. No acute infarct. No intracranial hemorrhage. No mass. No midline shift. No hydrocephalus. The sella is unremarkable. Normal flow voids. ORBITS: Ocular lens replacements. No acute abnormality. SINUSES AND MASTOIDS: No acute abnormality. BONES AND SOFT TISSUES: Normal marrow signal. No acute soft tissue abnormality. IMPRESSION: 1. No acute intracranial abnormality. 2. Right greater than left occipital encephalomalacia, old infarct of the left frontal operculum, and subcortical chronic infarcts within the medial right frontal and parietal lobes. 3. Mild generalized volume loss. 4. Multifocal hyperintense T2-weighted signal within the cerebral white matter, most commonly due to chronic small vessel disease. Electronically signed by: Franky Stanford MD 05/06/2024 04:05 AM EDT RP Workstation: [id number]   CT Head Wo Contrast Result Date: 05/06/2024 EXAM: CT HEAD AND CERVICAL SPINE 05/06/2024 01:36:51 AM TECHNIQUE: CT of the head and cervical spine was performed without the administration of intravenous  contrast. Multiplanar reformatted images are provided for review. Automated exposure control, iterative reconstruction, and/or weight based adjustment of the mA/kV was utilized to reduce the radiation dose to as low as reasonably achievable. COMPARISON: 05/06/2024 CLINICAL HISTORY: Head trauma, minor (Age >= 65y). Head trauma, neck trauma/ fatigue FINDINGS: CT HEAD BRAIN AND VENTRICLES: No acute intracranial hemorrhage. No mass effect or midline shift. No abnormal extra-axial fluid collection. No evidence of acute infarct. No hydrocephalus. There are old infarcts of the right occipital lobe and left frontal white matter. Generalized volume loss. Chronic ischemic white matter changes. ORBITS: No acute abnormality. SINUSES AND MASTOIDS: No acute abnormality. SOFT TISSUES AND SKULL: No acute skull fracture. No acute soft tissue abnormality. CT CERVICAL SPINE BONES AND ALIGNMENT: No acute fracture or traumatic malalignment. DEGENERATIVE CHANGES: No significant degenerative changes. SOFT TISSUES: No prevertebral soft tissue swelling. IMPRESSION: 1. No acute intracranial abnormality. 2. Old infarcts of the right occipital lobe and left frontal white matter. 3. Generalized volume loss and chronic ischemic white matter changes. 4. No acute abnormality of the cervical spine. Electronically signed by: Franky Stanford MD 05/06/2024 02:07 AM EDT RP Workstation: [id number]   CT Cervical Spine Wo Contrast Result Date: 05/06/2024 EXAM: CT HEAD AND CERVICAL SPINE 05/06/2024  01:36:51 AM TECHNIQUE: CT of the head and cervical spine was performed without the administration of intravenous contrast. Multiplanar reformatted images are provided for review. Automated exposure control, iterative reconstruction, and/or weight based adjustment of the mA/kV was utilized to reduce the radiation dose to as low as reasonably achievable. COMPARISON: 05/06/2024 CLINICAL HISTORY: Head trauma, minor (Age >= 65y). Head trauma, neck trauma/ fatigue  FINDINGS: CT HEAD BRAIN AND VENTRICLES: No acute intracranial hemorrhage. No mass effect or midline shift. No abnormal extra-axial fluid collection. No evidence of acute infarct. No hydrocephalus. There are old infarcts of the right occipital lobe and left frontal white matter. Generalized volume loss. Chronic ischemic white matter changes. ORBITS: No acute abnormality. SINUSES AND MASTOIDS: No acute abnormality. SOFT TISSUES AND SKULL: No acute skull fracture. No acute soft tissue abnormality. CT CERVICAL SPINE BONES AND ALIGNMENT: No acute fracture or traumatic malalignment. DEGENERATIVE CHANGES: No significant degenerative changes. SOFT TISSUES: No prevertebral soft tissue swelling. IMPRESSION: 1. No acute intracranial abnormality. 2. Old infarcts of the right occipital lobe and left frontal white matter. 3. Generalized volume loss and chronic ischemic white matter changes. 4. No acute abnormality of the cervical spine. Electronically signed by: Franky Stanford MD 05/06/2024 02:07 AM EDT RP Workstation: [id number]    Microbiology: Recent Results (from the past 240 hours)  Urine Culture     Status: None   Collection Time: 05/11/24  1:27 PM   Specimen: Urine, Clean Catch  Result Value Ref Range Status   Specimen Description   Final    URINE, CLEAN CATCH Performed at [facility], [address] [place], [place] [address]    Special Requests   Final    NONE Performed at [facility], [address], [place], [place] [address]    Culture   Final    NO GROWTH Performed at [facility], [address], [place], [place] [address]    Report Status 05/12/2024 FINAL  Final  MRSA Next Gen by PCR, Nasal     Status: None   Collection Time: 05/11/24  7:49 PM   Specimen: Nasal Mucosa; Nasal Swab  Result Value Ref Range Status   MRSA by PCR Next Gen NOT DETECTED NOT DETECTED Final    Comment: (NOTE) The GeneXpert MRSA Assay (FDA approved for NASAL specimens  only), is one component of a comprehensive MRSA colonization surveillance program. It is not intended to diagnose MRSA infection nor to guide or monitor treatment for MRSA infections. Test performance is not FDA approved in patients less than 44 years old. Performed at [facility], [address], [place], [place] [address]   Culture, blood (Routine X 2) w Reflex to ID Panel     Status: None   Collection Time: 05/11/24 10:22 PM   Specimen: BLOOD RIGHT HAND  Result Value Ref Range Status   Specimen Description BLOOD RIGHT HAND  Final   Special Requests   Final    BOTTLES DRAWN AEROBIC AND ANAEROBIC Blood Culture results may not be optimal due to an inadequate volume of blood received in culture bottles   Culture   Final    NO GROWTH 5 DAYS Performed at [facility], [address] [place], [place] [address]    Report Status 05/16/2024 FINAL  Final  Culture, blood (Routine X 2) w Reflex to ID Panel     Status: None   Collection Time: 05/11/24 10:34 PM   Specimen: BLOOD LEFT HAND  Result  Value Ref Range Status   Specimen Description BLOOD LEFT HAND  Final   Special Requests   Final    BOTTLES DRAWN AEROBIC AND ANAEROBIC Blood Culture results may not be optimal due to an inadequate volume of blood received in culture bottles   Culture   Final    NO GROWTH 5 DAYS Performed at [facility], [address] [place], [place] [address]    Report Status 05/16/2024 FINAL  Final  CSF culture w Gram Stain     Status: None   Collection Time: 05/12/24 11:34 AM   Specimen: CSF; Cerebrospinal Fluid  Result Value Ref Range Status   Specimen Description CSF  Final   Special Requests LP  Final   Gram Stain   Final    WBC PRESENT, PREDOMINANTLY MONONUCLEAR NO ORGANISMS SEEN CYTOSPIN SMEAR    Culture   Final    NO GROWTH 3 DAYS Performed at [facility], [address] [place], [place] [address]    Report Status 05/15/2024 FINAL  Final  Culture, Respiratory w Gram  Stain     Status: None   Collection Time: 05/13/24  8:47 AM   Specimen: Tracheal Aspirate; Respiratory  Result Value Ref Range Status   Specimen Description TRACHEAL ASPIRATE  Final   Special Requests NONE  Final   Gram Stain   Final    ABUNDANT WBC PRESENT, PREDOMINANTLY PMN MODERATE GRAM POSITIVE COCCI IN CLUSTERS IN CHAINS FEW GRAM NEGATIVE RODS    Culture   Final    Normal respiratory flora-no Staph aureus or Pseudomonas seen Performed at [facility], [address], [place], [place] [address]    Report Status 05/16/2024 FINAL  Final     Labs: Basic Metabolic Panel: Recent Labs  Lab 05/14/24 0817 05/14/24 2344 05/15/24 1056 05/16/24 0537 05/17/24 0440 05/18/24 0530 05/19/24 0858 05/20/24 0216  NA 148* 142   < > 137 137 142 145 146*  K 3.2* 4.1   < > 4.4 4.8 4.5 4.6 4.0  CL 110 107   < > 102 103 107 107 106  CO2 23 22   < > 23 24 24 23 26   GLUCOSE 181* 301*   < > 220* 249* 88 106* 93  BUN 42* 49*   < > 54* 54* 57* 69* 73*  CREATININE 1.53* 1.68*   < > 1.43* 1.25* 1.34* 1.51* 1.63*  CALCIUM  9.2 8.2*   < > 8.0* 8.1* 8.7* 9.2 9.2  MG 2.2 2.1  --  2.2 2.2 2.1  --   --   PHOS 3.1  --   --   --   --  3.6  --   --    < > = values in this interval not displayed.   Liver Function Tests: Recent Labs  Lab 05/14/24 2344 05/15/24 1056  AST 62* 60*  ALT 77* 85*  ALKPHOS 54 58  BILITOT 0.5 0.4  PROT 5.5* 5.5*  ALBUMIN  2.0* 2.0*   No results for input(s): LIPASE, AMYLASE in the last 168 hours. No results for input(s): AMMONIA in the last 168 hours. CBC: Recent Labs  Lab 05/14/24 0817 05/14/24 2344 05/15/24 1056 05/16/24 0537 05/17/24 0440 05/18/24 0530 05/19/24 0858  WBC 17.3*   < > 15.8* 15.4* 15.2* 18.7* 20.4*  NEUTROABS 15.2*  --  14.7*  --   --   --   --   HGB 13.9   < > 11.5* 11.7* 11.2* 11.8* 12.3  HCT 41.5   < > 35.7* 35.4* 34.0* 35.0* 35.9*  MCV 91.4   < > 94.9 92.4 92.4 91.1 90.2  PLT 344   < > 299 283 288 391 421*   < > = values in  this interval not displayed.   Cardiac Enzymes: Recent Labs  Lab 05/14/24 0933  CKTOTAL 42   BNP: BNP (last 3 results) No results for input(s): BNP in the last 8760 hours.  ProBNP (last 3 results) No results for input(s): PROBNP in the last 8760 hours.  CBG: Recent Labs  Lab 05/19/24 2033 05/19/24 2344 05/20/24 0342 05/20/24 0803 05/20/24 1134  GLUCAP 287* 134* 135* 110* 157*       Signed:  Sigurd Pac MD.  [facility] 05/20/2024, 12:24 PM

## 2024-05-20 NOTE — Progress Notes (Signed)
 Subjective: No acute events overnight.  ROS: negative except above Examination  Vital signs in last 24 hours: Temp:  [97.9 F (36.6 C)-98.6 F (37 C)] 98.6 F (37 C) (09/17 0806) Pulse Rate:  [88-151] 151 (09/17 0806) Resp:  [16-20] 20 (09/17 0806) BP: (107-172)/(67-139) 157/139 (09/17 0806) SpO2:  [95 %-97 %] 96 % (09/17 0806)  General: Sitting in chair, not in apparent distress Neuro: MS: Alert, oriented to self and place, not to time, follows commands CN: Left-sided neglect, rest of the cranial nerves grossly intact Motor: 4/5 in bilateral upper extremities, 3/5 in bilateral lower extremities with left hemiparesis  Gait: not tested  Basic Metabolic Panel: Recent Labs  Lab 05/14/24 0817 05/14/24 2344 05/15/24 1056 05/16/24 0537 05/17/24 0440 05/18/24 0530 05/19/24 0858 05/20/24 0216  NA 148* 142   < > 137 137 142 145 146*  K 3.2* 4.1   < > 4.4 4.8 4.5 4.6 4.0  CL 110 107   < > 102 103 107 107 106  CO2 23 22   < > 23 24 24 23 26   GLUCOSE 181* 301*   < > 220* 249* 88 106* 93  BUN 42* 49*   < > 54* 54* 57* 69* 73*  CREATININE 1.53* 1.68*   < > 1.43* 1.25* 1.34* 1.51* 1.63*  CALCIUM  9.2 8.2*   < > 8.0* 8.1* 8.7* 9.2 9.2  MG 2.2 2.1  --  2.2 2.2 2.1  --   --   PHOS 3.1  --   --   --   --  3.6  --   --    < > = values in this interval not displayed.    CBC: Recent Labs  Lab 05/14/24 0817 05/14/24 2344 05/15/24 1056 05/16/24 0537 05/17/24 0440 05/18/24 0530 05/19/24 0858  WBC 17.3*   < > 15.8* 15.4* 15.2* 18.7* 20.4*  NEUTROABS 15.2*  --  14.7*  --   --   --   --   HGB 13.9   < > 11.5* 11.7* 11.2* 11.8* 12.3  HCT 41.5   < > 35.7* 35.4* 34.0* 35.0* 35.9*  MCV 91.4   < > 94.9 92.4 92.4 91.1 90.2  PLT 344   < > 299 283 288 391 421*   < > = values in this interval not displayed.     Coagulation Studies: No results for input(s): LABPROT, INR in the last 72 hours.  Imaging No new brain imaging overnight   ASSESSMENT AND PLAN:78 year old female with  recent left frontal infarct, prior multiple strokes, recent UTI on Keflex  who presented with status epilepticus which has since resolved   Focal convulsive status epilepticus, resolved Acute encephalopathy, resolved - Etiology of seizure: Likely in the setting of UTI in a patient with underlying strokes - Continue Keppra  500 mg twice daily.   - As needed IV Versed  for clinical seizures - F/u with neuro in 2-3 months   Delirium - Continue Seroquel  12.5 mg at 1800.  Also as needed additional dose of 12.5 mg at 2000 if needed - Delirium precautions - Neurology will sign off.  Please call us  for any further questions   Seizure precautions: Per [place]  DMV statutes, patients with seizures are not allowed to drive until they have been seizure-free for six months and cleared by a physician    Use caution when using heavy equipment or power tools. Avoid working on ladders or at heights. Take showers instead of baths. Ensure the water  temperature is not  too high on the home water  heater. Do not go swimming alone. Do not lock yourself in a room alone (i.e. bathroom). When caring for infants or small children, sit down when holding, feeding, or changing them to minimize risk of injury to the child in the event you have a seizure. Maintain good sleep hygiene. Avoid alcohol.    If patient has another seizure, call 911 and bring them back to the ED if: A.  The seizure lasts longer than 5 minutes.      B.  The patient doesn't wake shortly after the seizure or has new problems such as difficulty seeing, speaking or moving following the seizure C.  The patient was injured during the seizure D.  The patient has a temperature over 102 F (39C) E.  The patient vomited during the seizure and now is having trouble breathing    During the Seizure   - First, ensure adequate ventilation and place patients on the floor on their left side  Loosen clothing around the neck and ensure the airway is patent. If  the patient is clenching the teeth, do not force the mouth open with any object as this can cause severe damage - Remove all items from the surrounding that can be hazardous. The patient may be oblivious to what's happening and may not even know what he or she is doing. If the patient is confused and wandering, either gently guide him/her away and block access to outside areas - Reassure the individual and be comforting - Call 911. In most cases, the seizure ends before EMS arrives. However, there are cases when seizures may last over 3 to 5 minutes. Or the individual may have developed breathing difficulties or severe injuries. If a pregnant patient or a person with diabetes develops a seizure, it is prudent to call an ambulance.     After the Seizure (Postictal Stage)   After a seizure, most patients experience confusion, fatigue, muscle pain and/or a headache. Thus, one should permit the individual to sleep. For the next few days, reassurance is essential. Being calm and helping reorient the person is also of importance.   Most seizures are painless and end spontaneously. Seizures are not harmful to others but can lead to complications such as stress on the lungs, brain and the heart. Individuals with prior lung problems may develop labored breathing and respiratory distress.       I personally spent a total of 36 minutes in the care of the patient today including getting/reviewing separately obtained history, performing a medically appropriate exam/evaluation, counseling and educating, placing orders, referring and communicating with other health care professionals, documenting clinical information in the EHR, independently interpreting results, and coordinating care.          [name] Epilepsy [facility] For questions after 5pm please refer to AMION to reach the Neurologist on call

## 2024-05-20 NOTE — Progress Notes (Signed)
 Inpatient Rehab Admissions Coordinator:   I have a bed available for pt to admit to CIR today.  Dr. Fairy in agreement and [facility] aware.  I will let pt/family know and make arrangements.   Reche Lowers, PT, DPT Admissions Coordinator [phone] 05/20/24  10:32 AM

## 2024-05-20 NOTE — Progress Notes (Signed)
 Occupational Therapy Treatment Patient Details Name: Beth Macdonald MRN: [id number] DOB: 07-27-1946 Today's Date: 05/20/2024   History of present illness Pt is an 78 y.o. female who presented 05/11/24 with L-sided weakness after d/c'ing home earlier that day from presenting with back pain and UTI. Pt also with seizure activity at home, en route, and in ED. Pt also admitted with Acute systolic heart failure/ST Elevation/Troponin elevation. ETT 9/8 - 9/14. S/p lumbar puncture 9/9. 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   OT comments  Pt awake and alert on entry, oriented to self and knows she is in the hospital. Able to progress to EOB with Max A +2 however required Max A to maintain midline postural control EOB. Required use of Stedy with Max A +2 to mobilize EOB to chair. Pt will benefit from extensive rehab however will most likely need Mod to Max A with ADL and mobility after DC. Discussed with AIR CM who states plan is most likely SNF after CIR. Acute OT to follow.       If plan is discharge home, recommend the following:  Two people to help with walking and/or transfers;Assistance with cooking/housework;Direct supervision/assist for medications management;Direct supervision/assist for financial management;Assist for transportation;Help with stairs or ramp for entrance;Two people to help with bathing/dressing/bathroom   Equipment Recommendations  Other (comment);Hoyer lift;Wheelchair (measurements OT);Wheelchair cushion (measurements OT);Hospital bed    Recommendations for Other Services Rehab consult    Precautions / Restrictions Precautions Precautions: Fall;Other (comment) Recall of Precautions/Restrictions: Impaired Precaution/Restrictions Comments: L  inattention Restrictions Weight Bearing Restrictions Per Provider Order: No       Mobility Bed Mobility Overal bed mobility: Needs Assistance Bed Mobility: Supine to Sit Rolling: Used rails, Max assist, +2 for physical assistance              Transfers Overall transfer level: Needs assistance   Transfers: Sit to/from Stand, Bed to chair/wheelchair/BSC Sit to Stand: Max assist, +2 safety/equipment     Step pivot transfers: Contact guard assist, Min assist     General transfer comment: unable to maintain upright posture EOB; leaning posteriorly then leaning anteriorly requiring Ma X to prevent fall off bed; R lat lean Transfer via Lift Equipment: Stedy   Balance Overall balance assessment: Needs assistance Sitting-balance support: Bilateral upper extremity supported, Feet supported Sitting balance-Leahy Scale: Zero Sitting balance - Comments: R lateral lean requiring assist to correct despite cues for hand placement Postural control: Right lateral lean, Posterior lean, Other (comment) (anterior lean)                                 ADL either performed or assessed with clinical judgement   ADL Overall ADL's : Needs assistance/impaired Eating/Feeding: Set up;Sitting   Grooming: Set up;Sitting   Upper Body Bathing: Moderate assistance;Bed level   Lower Body Bathing: Maximal assistance;Bed level   Upper Body Dressing : Moderate assistance;Sitting   Lower Body Dressing: Total assistance;Bed level   Toilet Transfer: Maximal assistance;+2 for physical assistance Toilet Transfer Details (indicate cue type and reason): requires use of lift equipment Toileting- Clothing Manipulation and Hygiene: Total assistance       Functional mobility during ADLs: Maximal assistance;+2 for physical assistance      Extremity/Trunk Assessment Upper Extremity Assessment Upper Extremity  Assessment: LUE deficits/detail LUE Deficits / Details: 3/5 globally weak  compared to RUE LUE Sensation: WNL LUE Coordination: decreased fine motor;decreased gross motor   Lower Extremity Assessment Lower Extremity Assessment: Defer to PT evaluation        Vision   Vision Assessment?: Vision impaired- to be further tested in functional context Additional Comments: visual inattention   Perception Perception Perception: Not tested   Praxis Praxis Praxis: Not tested   Communication Communication Communication: Impaired Factors Affecting Communication: Reduced clarity of speech   Cognition Arousal: Alert Behavior During Therapy: Flat affect Cognition: Cognition impaired   Orientation impairments: Situation, Place, Time (states she is at hospital  - Beth Macdonald; reports 2025 for the month) Awareness: Online awareness impaired, Intellectual awareness impaired Memory impairment (select all impairments): Short-term memory, Working [employer] fast streamer, Engineer, structural memory Attention impairment (select first level of impairment): Sustained attention Executive functioning impairment (select all impairments): Problem solving, Organization, Initiation, Sequencing, Reasoning                   Following commands: Impaired Following commands impaired: Follows one step commands inconsistently      Cueing   Cueing Techniques: Verbal cues, Gestural cues, Tactile cues, Visual cues  Exercises Exercises: Other exercises Other Exercises Other Exercises: general LE adn UE ROM before bed mobility    Shoulder Instructions       General Comments  Left with classical music playing which she states she enjoys    Pertinent Vitals/ Pain       Pain Assessment Pain Assessment: Faces Faces Pain Scale: Hurts little more Pain Location: generalized Pain Descriptors / Indicators: Discomfort, Grimacing, Guarding Pain Intervention(s): Limited activity within patient's tolerance  Home Living                                          Prior  Functioning/Environment              Frequency  Min 2X/week        Progress Toward Goals  OT Goals(current goals can now be found in the care plan section)     Acute Rehab OT Goals OT Goal Formulation: With patient Time For Goal Achievement: 06/01/24 Potential to Achieve Goals: Good ADL Goals Pt Will Perform Grooming: with supervision;standing Pt Will Perform Upper Body Dressing: with modified independence;sitting Pt Will Perform Lower Body Dressing: with modified independence;sitting/lateral leans;sit to/from stand Pt Will Transfer to Toilet: ambulating;regular height toilet;with min assist Pt Will Perform Tub/Shower Transfer: Tub transfer;Shower transfer;with min assist;ambulating;shower seat  Plan      Co-evaluation                 AM-PAC OT 6 Clicks Daily Activity     Outcome Measure   Help from another person eating meals?: A Little Help from another person taking care of personal grooming?: A Little Help from another person toileting, which includes using toliet, bedpan, or urinal?: Total Help from another person bathing (including washing, rinsing, drying)?: A Lot Help from another person to put on and taking off regular upper body clothing?: A Lot Help from another person to put on and taking off regular lower body clothing?: Total 6 Click Score: 12    End of Session Equipment Utilized During Treatment: Gait belt  OT Visit Diagnosis: Unsteadiness on feet (R26.81);Other abnormalities of gait and mobility (R26.89);Muscle weakness (generalized) (M62.81);Other symptoms and signs involving cognitive  function;Cognitive communication deficit (R41.841)   Activity Tolerance Patient tolerated treatment well   Patient Left with call bell/phone within reach;in chair;with chair alarm set   Nurse Communication Mobility status;Need for lift equipment        Time: 1002-1027 OT Time Calculation (min): 25 min  Charges: OT General Charges $OT Visit: 1  Visit OT Treatments $Self Care/Home Management : 23-37 mins  Kreg Sink, OT/L   Acute OT Clinical Specialist Acute Rehabilitation Services Pager [phone] Office [phone]   [facility] 05/20/2024, 10:57 AM

## 2024-05-20 NOTE — Progress Notes (Signed)
 Heart Failure Nurse Navigator Progress Note  PCP: Clarice Nottingham, MD PCP-Cardiologist: None Admission Diagnosis: Code Stroke  Admitted from: Home Via EMS.   Presentation:   Rhilyn Battle Macdonald presented as a Code Stroke with concerns for seizure like activity. , earlier that day at [facility] for UTI and back pain. CT Head was negative for ICH, +chronic small vessel disease with parenchymal volume loss and encephalomalacia of the L frontal lobe/R parietal lobe 2/2 remote infarcts. 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 Neurology was consulted.Patient mental status declined and she was no longer able to protect her airway; she was subsequently intubated in ED. BP 142/82, HR 127, Troponin 1,817. Cardiology consulted for new ST elevations in V1 and V2 that were different from her prior ECG.  but given recent CVA with hemorrhagic conversion and presentation with status epilepticus, felt that risk of invasive workup outweigh benefits and did not recommend heart catheterization . ECHO shows severe systolic heart failure, differential , LAD infarct vs Takotsubo cardiomyopathy. Cardiology recommended conservative management at this time.   Patient transferred to CIR before Heart Failure Education could be done. Will give  Living Better with HF  Book to patient at HF [facility] appointment on 06/19/2024 @ 11 am. Per Dr. Fairy.   ECHO/ LVEF: 25-30%   Clinical Course:  Past Medical History:  Diagnosis Date   Aortic atherosclerosis (HCC)    Diabetes mellitus without complication (HCC)    Type 2   Diabetic neuropathy (HCC)    DVT of proximal lower limb (HCC)    Facial paralysis/Bells palsy    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HCC)    Hyperlipidemia    Mitral valve insufficiency    Osteoarthritis    SCCA (squamous cell carcinoma) of skin 03/22/2021   Bridge of nose  (in situ)   Skull fracture (HCC)    age 78   Squamous cell carcinoma of skin 03/22/2021   Right forehead (in situ)   TIA (transient ischemic attack) 2020   Varicose vein of leg      Social History   Socioeconomic History   Marital status: Married    Spouse name: Not on file   Number of children: 0   Years of education: Not on file   Highest education level: Not on file  Occupational History   Not on file  Tobacco Use   Smoking status: Former    Current packs/day: 0.00    Average packs/day: 1 pack/day for 20.0 years (20.0 ttl pk-yrs)    Types: Cigarettes    Start date: 33    Quit date: 20    Years since quitting: 45.7   Smokeless tobacco: Never  Vaping Use   Vaping status: Never Used  Substance and Sexual Activity   Alcohol use: Yes    Comment: social/rarely   Drug use: Never   Sexual activity: Yes  Other Topics Concern   Not on file  Social History Narrative   Not on file   Social Drivers of Health   Financial Resource Strain: Not on file  Food Insecurity: No Food Insecurity (05/14/2024)   Hunger Vital Sign    Worried About Running Out of Food in the Last Year: Never true    Ran Out of Food in the Last Year: Never true  Transportation Needs: No Transportation Needs (05/14/2024)   PRAPARE - Transportation    Lack of Transportation (  Medical): No    Lack of Transportation (Non-Medical): No  Physical Activity: Not on file  Stress: Not on file  Social Connections: Unknown (05/14/2024)   Social Connection and Isolation Panel    Frequency of Communication with Friends and Family: More than three times a week    Frequency of Social Gatherings with Friends and Family: Twice a week    Attends Religious Services: Not on Marketing executive or Organizations: No    Attends Engineer, structural: 1 to 4 times per year    Marital Status: Married  Recent Concern: Social Connections - Moderately Isolated (03/14/2024)   Social Connection and Isolation  Panel    Frequency of Communication with Friends and Family: More than three times a week    Frequency of Social Gatherings with Friends and Family: More than three times a week    Attends Religious Services: Never    Database administrator or Organizations: No    Attends Engineer, structural: Never    Marital Status: Married    High Risk Criteria for Readmission and/or Poor Patient Outcomes: Heart failure hospital admissions (last 6 months): 0  No Show rate: 3 %  Difficult social situation: Discharging to CIR then to SNF.  Demonstrates medication adherence: yes Primary Language: English  Literacy level: Reading, writing   Barriers of Care:   A & O x2, ( dementia)  left side weakness( CVA) Right side residual weakness ( Bell's palsy)  Diet/ fluid restrictions ( dysphagia  2 diet)  Daily weights    Considerations/Referrals:   Referral made to Heart Failure Pharmacist Stewardship: NA Referral made to Heart Failure CSW/NCM TOC: NA Referral made to Heart & Vascular TOC clinic: Yes, per Dr. Fairy ( 1 month after transfer to CIR) HF TOC on 06/19/2024 @ 11 am   Items for Follow-up on DC/TOC: Continued HF education    Stephane Haddock, BSN, RN Heart Failure Print production planner Chat Only

## 2024-05-20 NOTE — H&P (Signed)
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 with IV lasix  started on 09/16  --Recheck labs in am and may need to back off of worsening. 11. Hypernatremia: Likely due to poor intake.  --Recheck BMET in am 12. T2DM: Hgb A1c-8.4. Monitor BS ac/hs and use SSI for elevated BS  --continue lantus  8 units BID-->may need to adjust as off tube feeds.   13.  Dysphagia: continue D2, nectar liquids  14. LUE edema: Question due to IV infiltration v/s DVT. Dopplers ordered  15. New onset seizures: continue Keppra .     I have personally performed a face to face diagnostic evaluation, including, but not limited to relevant history and physical exam findings, of this patient and developed relevant assessment and plan.  Additionally, I have reviewed and concur with the physician assistant's documentation above.  Sharlet GORMAN Schmitz, PA-C   Sven SHAUNNA Elks, MD 05/20/2024

## 2024-05-21 ENCOUNTER — Inpatient Hospital Stay (HOSPITAL_COMMUNITY): Payer: MEDICARE

## 2024-05-21 DIAGNOSIS — Z0189 Encounter for other specified special examinations: Secondary | ICD-10-CM | POA: Diagnosis not present

## 2024-05-21 DIAGNOSIS — I69398 Other sequelae of cerebral infarction: Secondary | ICD-10-CM | POA: Diagnosis not present

## 2024-05-21 DIAGNOSIS — G40909 Epilepsy, unspecified, not intractable, without status epilepticus: Secondary | ICD-10-CM | POA: Diagnosis not present

## 2024-05-21 LAB — BASIC METABOLIC PANEL WITH GFR
Anion gap: 16 — ABNORMAL HIGH (ref 5–15)
BUN: 64 mg/dL — ABNORMAL HIGH (ref 8–23)
CO2: 28 mmol/L (ref 22–32)
Calcium: 8.7 mg/dL — ABNORMAL LOW (ref 8.9–10.3)
Chloride: 100 mmol/L (ref 98–111)
Creatinine, Ser: 1.48 mg/dL — ABNORMAL HIGH (ref 0.44–1.00)
GFR, Estimated: 36 mL/min — ABNORMAL LOW (ref >60)
Glucose, Bld: 162 mg/dL — ABNORMAL HIGH (ref 70–99)
Potassium: 3.4 mmol/L — ABNORMAL LOW (ref 3.5–5.1)
Sodium: 144 mmol/L (ref 135–145)

## 2024-05-21 LAB — CBC
HCT: 35 % — ABNORMAL LOW (ref 36.0–46.0)
Hemoglobin: 11.4 g/dL — ABNORMAL LOW (ref 12.0–15.0)
MCH: 30.2 pg (ref 26.0–34.0)
MCHC: 32.6 g/dL (ref 30.0–36.0)
MCV: 92.6 fL (ref 80.0–100.0)
Platelets: 378 K/uL (ref 150–400)
RBC: 3.78 MIL/uL — ABNORMAL LOW (ref 3.87–5.11)
RDW: 14.2 % (ref 11.5–15.5)
WBC: 13.5 K/uL — ABNORMAL HIGH (ref 4.0–10.5)
nRBC: 0.2 % (ref 0.0–0.2)

## 2024-05-21 LAB — GLUCOSE, CAPILLARY
Glucose-Capillary: 144 mg/dL — ABNORMAL HIGH (ref 70–99)
Glucose-Capillary: 130 mg/dL — ABNORMAL HIGH (ref 70–99)
Glucose-Capillary: 168 mg/dL — ABNORMAL HIGH (ref 70–99)
Glucose-Capillary: 167 mg/dL — ABNORMAL HIGH (ref 70–99)
Glucose-Capillary: 172 mg/dL — ABNORMAL HIGH (ref 70–99)

## 2024-05-21 MED ORDER — POTASSIUM CHLORIDE 20 MEQ PO PACK
40.0000 meq | PACK | Freq: Every day | ORAL | Status: AC
  Administered 2024-05-21 – 2024-05-23 (×3): 40 meq via ORAL
  Filled 2024-05-21 (×3): qty 2

## 2024-05-21 MED ORDER — DONEPEZIL HCL 10 MG PO TABS
10.0000 mg | ORAL_TABLET | Freq: Every day | ORAL | Status: DC
Start: 2024-05-21 — End: 2024-05-28
  Administered 2024-05-22 – 2024-05-27 (×6): 10 mg via ORAL
  Filled 2024-05-21 (×7): qty 1

## 2024-05-21 NOTE — Progress Notes (Signed)
 Inpatient Rehabilitation Admission Medication Review by a Pharmacist  A complete drug regimen review was completed for this patient to identify any potential clinically significant medication issues.  High Risk Drug Classes Is patient taking? Indication by Medication  Antipsychotic Yes Quetiapine  - agitation Haloperidol  prn agitation Compazine  prn N/V  Anticoagulant Yes Lovenox  - VTE ppx  Antibiotic Yes Augmentin  po - UTI  Opioid No   Antiplatelet Yes Aspirin , clopidogrel  - CVA  Hypoglycemics/insulin  Yes Insulin  -DM  Vasoactive Medication Yes Furosemide , spironolactone  - fluid, CHF  Chemotherapy No   Other Yes Levetiracetam  -seizures Atorvastatin  - HLD     Type of Medication Issue Identified Description of Issue Recommendation(s)  Drug Interaction(s) (clinically significant)     Duplicate Therapy     Allergy     No Medication Administration End Date     Incorrect Dose     Additional Drug Therapy Needed     Significant med changes from prior encounter (inform family/care partners about these prior to discharge).    Other       Clinically significant medication issues were identified that warrant physician communication and completion of prescribed/recommended actions by midnight of the next day:  No  Name of provider notified for urgent issues identified:   Provider Method of Notification:   Pharmacist comments: None  Time spent performing this drug regimen review (minutes):  20 minutes  Thank you. Olam Monte, PharmD

## 2024-05-21 NOTE — Progress Notes (Signed)
 Inpatient Rehabilitation Care Coordinator Assessment and Plan Patient Details  Name: Beth Macdonald MRN: [id number] Date of Birth: 05-Apr-1946  Today's Date: 05/21/2024  Hospital Problems: Principal Problem:   Seizure disorder as sequela of cerebrovascular accident [facility])  Past Medical History:  Past Medical History:  Diagnosis Date   Aortic atherosclerosis (HCC)    Diabetes mellitus without complication (HCC)    Type 2   Diabetic neuropathy (HCC)    DVT of proximal lower limb (HCC)    Facial paralysis/Bells palsy    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HCC)    Hyperlipidemia    Mitral valve insufficiency    Osteoarthritis    SCCA (squamous cell carcinoma) of skin 03/22/2021   Bridge of nose (in situ)   Skull fracture (HCC)    age 78   Squamous cell carcinoma of skin 03/22/2021   Right forehead (in situ)   TIA (transient ischemic attack) 2020   Varicose vein of leg    Past Surgical History:  Past Surgical History:  Procedure Laterality Date   ELBOW SURGERY Bilateral    20 yrs ago   ENDOVENOUS ABLATION SAPHENOUS VEIN W/ LASER Bilateral    ENDOVENOUS ABLATION SAPHENOUS VEIN W/ LASER Left 11/19/2019   endovenous laser ablation anterior accessory branch of left GSV and stab phlebectomy> 20 incisions left leg by Medford Blade MD    shoulder Bilateral    [phone]   TOENAIL EXCISION Right 2019   Big toe   TONSILLECTOMY Bilateral    TOTAL KNEE ARTHROPLASTY Right 01/01/2022   Procedure: TOTAL KNEE ARTHROPLASTY;  Surgeon: Melodi Lerner, MD;  Location: [facility];  Service: Orthopedics;  Laterality: Right;   Social History:  reports that she quit smoking about 45 years ago. Her smoking use included cigarettes. She started smoking about 65 years ago. She has a 20 pack-year smoking history. She has never used smokeless tobacco. She reports current alcohol use. She reports that she does not use drugs.  Family / Support Systems Marital Status: Married How Long?: 15 years Patient  Roles: Spouse Spouse/Significant Other: [name] (wife) Children: no children Other Supports: local friends Anticipated Caregiver: Pt wife Ability/Limitations of Caregiver: Pt will d/c to SNF to reduce burden of care Caregiver Availability: 24/7 Family Dynamics: Pt lives with her wife and their cat.  Social History Preferred language: English Religion: Non-Denominational Cultural Background: Pt worked as a Advertising account planner, and also worked at the railroad. Education: some Charity fundraiser - How often do you need to have someone help you when you read instructions, pamphlets, or other written material from your doctor or pharmacy?: Rarely Writes: Yes Employment Status: Retired Date Retired/Disabled/Unemployed: 2015 Marine scientist Issues: Wife denies Guardian/Conservator: HCPOA-wife   Abuse/Neglect Abuse/Neglect Assessment Can Be Completed: Yes Physical Abuse: Denies Verbal Abuse: Denies Sexual Abuse: Denies Exploitation of patient/patient's resources: Denies Self-Neglect: Denies  Patient response to: Social Isolation - How often do you feel lonely or isolated from those around you?: Rarely  Emotional Status Pt's affect, behavior and adjustment status: Pt wife answers majority of questions. Pt would answer with a nonverbal yes or no nod. Recent Psychosocial Issues: Wife repots anxiety since admission to hospital Psychiatric History: Denies any hx Substance Abuse History: Wife reports pt quit smoking cigarettes; unknown on how longh. Denies etoh and rec drug use.  Patient / Family Perceptions, Expectations & Goals Pt/Family understanding of illness & functional limitations: Wife and family have a general understandung of care needs. Premorbid pt/family roles/activities: some assistance with ADLs/IADLs Anticipated changes in  roles/activities/participation: Assistance with ADLs/IADLs Pt/family expectations/goals: Pt wife would like for pt to work on being able to  carry out basic functions such as physical control, standing, attend to personal care needs, eat, and speak  [employer]: None Premorbid Home Care/DME Agencies: None Transportation available at discharge: TBD Is the patient able to respond to transportation needs?: Yes In the past 12 months, has lack of transportation kept you from medical appointments or from getting medications?: No In the past 12 months, has lack of transportation kept you from meetings, work, or from getting things needed for daily living?: No Resource referrals recommended: Neuropsychology  Discharge Planning Living Arrangements: Spouse/significant other Support Systems: Spouse/significant other Type of Residence: Private residence Insurance Resources: Media planner (specify) ([employer]) Surveyor, quantity Resources: Tree surgeon, Other (Comment) (pension) Financial Screen Referred: No Living Expenses: Own Money Management: Spouse Does the patient have any problems obtaining your medications?: No Home Management: Wife manages all home care needs Patient/Family Preliminary Plans: No changes Care Coordinator Barriers to Discharge: Decreased caregiver support, Lack of/limited family support Care Coordinator Anticipated Follow Up Needs: HH/OP  Clinical Impression SW met with pt and pt wife with nursing present as she requires full supervision with meals. Pt is not a Cytogeneticist. NO HCPOA. DME- RW (used occasionally), canes, grab bars in bathroom, and shower stool.   Ahlana Slaydon A Dayzha Pogosyan 05/21/2024, 2:43 PM

## 2024-05-21 NOTE — Progress Notes (Signed)
 Remote Loop Recorder Transmission
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
## 2024-05-21 NOTE — Plan of Care (Signed)
  Problem: RH Swallowing Goal: LTG Patient will consume least restrictive diet using compensatory strategies with assistance (SLP) Description: LTG:  Patient will consume least restrictive diet using compensatory strategies with assistance (SLP) Flowsheets (Taken 05/21/2024 2011) LTG: Pt Patient will consume least restrictive diet using compensatory strategies with assistance of (SLP): Minimal Assistance - Patient > 75%   Problem: RH Cognition - SLP Goal: RH LTG Patient will demonstrate orientation with cues Description:  LTG:  Patient will demonstrate orientation to person/place/time/situation with cues (SLP)   Flowsheets (Taken 05/21/2024 2011) LTG Patient will demonstrate orientation to:  Place  Person  Time LTG: Patient will demonstrate orientation using cueing (SLP): Moderate Assistance - Patient 50 - 74%   Problem: RH Expression Communication Goal: LTG Patient will verbally express basic/complex needs(SLP) Description: LTG:  Patient will verbally express basic/complex needs, wants or ideas with cues  (SLP) Flowsheets (Taken 05/21/2024 2011) LTG: Patient will verbally express basic/complex needs, wants or ideas (SLP): Moderate Assistance - Patient 50 - 74% Note: Basic needs   Problem: RH Problem Solving Goal: LTG Patient will demonstrate problem solving for (SLP) Description: LTG:  Patient will demonstrate problem solving for basic/complex daily situations with cues  (SLP) Flowsheets (Taken 05/21/2024 2011) LTG: Patient will demonstrate problem solving for (SLP): Basic daily situations LTG Patient will demonstrate problem solving for: Moderate Assistance - Patient 50 - 74%   Problem: RH Memory Goal: LTG Patient will use memory compensatory aids to (SLP) Description: LTG:  Patient will use memory compensatory aids to recall biographical/new, daily complex information with cues (SLP) Flowsheets (Taken 05/21/2024 2011) LTG: Patient will use memory compensatory aids to (SLP): Moderate  Assistance - Patient 50 - 74% Note: Recall recent/relevant information

## 2024-05-21 NOTE — Evaluation (Signed)
 Physical Therapy Assessment and Plan  Patient Details  Name: Beth Macdonald MRN: [id number] Date of Birth: 01/25/1946  PT Diagnosis: Cognitive deficits, Impaired cognition, and Muscle weakness Rehab Potential: Fair ELOS: 10-14 Days   Today's Date: 05/21/2024 PT Individual Time: 1300-1415 PT Individual Time Calculation (min): 75 min    Hospital Problem: Principal Problem:   Seizure disorder as sequela of cerebrovascular accident [facility])   Past Medical History:  Past Medical History:  Diagnosis Date   Aortic atherosclerosis (HCC)    Diabetes mellitus without complication (HCC)    Type 2   Diabetic neuropathy (HCC)    DVT of proximal lower limb (HCC)    Facial paralysis/Bells palsy    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HCC)    Hyperlipidemia    Mitral valve insufficiency    Osteoarthritis    SCCA (squamous cell carcinoma) of skin 03/22/2021   Bridge of nose (in situ)   Skull fracture (HCC)    age 28   Squamous cell carcinoma of skin 03/22/2021   Right forehead (in situ)   TIA (transient ischemic attack) 2020   Varicose vein of leg    Past Surgical History:  Past Surgical History:  Procedure Laterality Date   ELBOW SURGERY Bilateral    20 yrs ago   ENDOVENOUS ABLATION SAPHENOUS VEIN W/ LASER Bilateral    ENDOVENOUS ABLATION SAPHENOUS VEIN W/ LASER Left 11/19/2019   endovenous laser ablation anterior accessory branch of left GSV and stab phlebectomy> 20 incisions left leg by Medford Blade MD    shoulder Bilateral    [phone]   TOENAIL EXCISION Right 2019   Big toe   TONSILLECTOMY Bilateral    TOTAL KNEE ARTHROPLASTY Right 01/01/2022   Procedure: TOTAL KNEE ARTHROPLASTY;  Surgeon: Melodi Lerner, MD;  Location: [facility];  Service: Orthopedics;  Laterality: Right;    Assessment & Plan Clinical Impression: Patient is a 78 year old female with 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 She had seizure enroute to ED and witnessed seizure with left side twitching in ED lasting 30-40 seconds. CT head negative. CTA head/neck showed R-MCA M2 branch occlusion with progression of stenosis rather than occlusion per Dr. Vanessa and neuroradiology. She was not a candidate for TNKase or thrombectomy She received IV ativan  and was loaded with Keppra . She developed hypoxic respiratory failure and was intubated for airway protection. MRI brain done revealing abnormal signal mesial right temporal and right hippocampal formation favored to relate to seizure/post ictal changes v/s ischemia or encephalitis. She was placed on LT-EEG and  LP done revealing mononuclear WBC without organisms and autoimmune panel ordered. CSF culture negative.    CT renal study showed bladder wall thickening c/w cystitis. Seizures felt to be in setting of UTI and stroke but patient continued to be encephalopathic. Respiratory culture positive and Ceftriaxone   X 5 days added on 09/11. ID consulted and as cultures negative did not see need for antibiotic regimen. MRI brain repeated on 09/10 and abnormal diffusion noted to be more pronounced T2 and FLAIR signal likely related to seizure activity and remote cortical infarcts. She has recurrent seizure activity on 09/12 requiring versed  and IV solumedrol X 5 days recommended by  neurology  due to concern of inflammatory etiology. She was placed back on LT-EEG which showed evidence of epileptogenicity and cortical dysfunction from right hemisphere. She had had issues with agitation requiring restraints.     Hospital course significant for ST elevation in V1 and V2  as well as acute systolic failure. 2 D echo showed severe systolic HF  question due to LAD  infarct v/s Takotsubo CM but presentation felt to be secondary to latter. She not felt to be cath candidate but question cardiac MRI in a few days.  Mentation slowly improving and she tolerated extubation by 09/14 and kept NPO due to confusion as well as hypophonia. MBS performed 09/16 and she was started on D2, honey liquids with aspiration precautions.  Dr. Ladona recommended DAPT if BP stable and spironolactone  added on 09/17. Ceftriaxone  completed on 09/15 but augmentin  added today for ??. Recommendations are to continue IV lasix  for 1-2 days then to transition to torsemide. She complains of left sided weaknes  Patient transferred to CIR on 05/20/2024 .   Patient currently requires total with mobility secondary to muscle weakness, decreased cardiorespiratoy endurance, decreased coordination and decreased motor planning, ideational apraxia, decreased initiation, decreased attention, decreased awareness, decreased problem solving, decreased safety awareness, decreased memory, and delayed processing, and decreased sitting balance, decreased standing balance, decreased postural control, and decreased balance strategies.  Prior to hospitalization, patient was modified independent  with mobility and lived with Spouse in a House home.  Home access is  Level entry.  Patient will benefit from skilled PT intervention to maximize safe functional mobility, minimize fall risk, and decrease caregiver burden for planned discharge SNF.  Anticipate patient will benefit from PT at SNF at discharge.  PT - End of Session Activity Tolerance: Tolerates 10 - 20 min activity with multiple rests Endurance Deficit: Yes PT Assessment Rehab Potential (ACUTE/IP ONLY): Fair PT Barriers to Discharge: Behavior;Incontinence;Decreased caregiver support PT Patient demonstrates impairments in the following area(s): Balance;Behavior;Endurance;Motor;Pain;Perception;Skin Integrity;Safety PT Transfers Functional  Problem(s): Bed Mobility;Bed to Chair;Car;Furniture PT Locomotion Functional Problem(s): Ambulation;Wheelchair Mobility;Stairs PT Plan PT Intensity: Minimum of 1-2 x/day ,45 to 90 minutes PT Frequency: 5 out of 7 days PT Duration Estimated Length of Stay: 10-14 Days PT Treatment/Interventions: Ambulation/gait training;Community reintegration;DME/adaptive equipment instruction;Neuromuscular re-education;Psychosocial support;UE/LE Strength taining/ROM;Wheelchair propulsion/positioning;Balance/vestibular training;Discharge planning;Functional electrical stimulation;Pain management;Skin care/wound management;Therapeutic Activities;UE/LE Coordination activities;Cognitive remediation/compensation;Disease management/prevention;Patient/family education;Therapeutic Exercise;Visual/perceptual remediation/compensation;Functional mobility training PT Transfers Anticipated Outcome(s): ModA PT Locomotion Anticipated Outcome(s): maxA +2 PT Recommendation Recommendations for Other Services: Therapeutic Recreation consult Therapeutic Recreation Interventions: Other (comment) (Dance) Follow Up Recommendations: Skilled nursing facility Patient destination: Skilled Nursing Facility (SNF) Equipment Recommended: To be determined   PT Evaluation Precautions/Restrictions Precautions Precautions: Fall;Other (comment) Precaution/Restrictions Comments: L inattention Restrictions Weight Bearing Restrictions Per Provider Order: No General Chart Reviewed: Yes Family/Caregiver Present: Yes  Pain Interference Pain Interference Pain Effect on Sleep: 8. Unable to answer Pain Interference with Therapy Activities: 8. Unable to answer Pain Interference with Day-to-Day Activities: 8. Unable to answer Home Living/Prior Functioning Home Living Available Help at Discharge: Family;Available 24 hours/day;Friend(s) Type of Home: House Home Access: Level entry Home Layout: One level Bathroom Shower/Tub: Architectural technologist: Standard  Lives With: Spouse Vision/Perception  Vision - History Ability to See in Adequate Light: 1 Impaired Vision - Assessment Additional Comments: did show some nystagmus (beating to the left)  with rolling to the left Perception Perception: Impaired Preception Impairment Details: Inattention/Neglect Perception-Other Comments: inattention to the left Praxis Praxis: Impaired Praxis Impairment Details: Ideomotor;Motor planning  Cognition Overall Cognitive Status: History of cognitive impairments - at baseline Arousal/Alertness: Awake/alert Attention: Sustained Sustained Attention: Impaired Sustained Attention Impairment: Functional basic Memory: Impaired Memory Impairment: Decreased recall of new information;Decreased short term memory Awareness: Impaired Awareness Impairment: Intellectual impairment Problem Solving: Impaired Safety/Judgment: Impaired Sensation Sensation Light Touch: Impaired Detail Light Touch Impaired Details: Impaired LUE Proprioception: Impaired Detail Proprioception Impaired Details: Impaired LUE;Impaired LLE Coordination Gross Motor Movements are Fluid and Coordinated: No Fine Motor Movements are Fluid and Coordinated: No Motor  Motor Motor: Hemiplegia;Abnormal postural alignment and control  Trunk/Postural Assessment  Cervical Assessment Cervical Assessment: Exceptions to [facility] (flexed forward) Thoracic Assessment Thoracic Assessment: Exceptions to [facility] (rounded forward) Lumbar Assessment Lumbar Assessment: Exceptions to [facility] (posterior pelvic tilt) Postural Control Postural Control: Deficits on evaluation Righting Reactions: delayed Protective Responses: delayed  Balance Balance Balance Assessed: Yes Static Sitting Balance Static Sitting - Level of Assistance: 3: Mod assist Dynamic Sitting Balance Dynamic Sitting - Level of Assistance: 3: Mod assist;2: Max assist Static Standing Balance Static Standing - Level of  Assistance: 2: Max assist Static Standing - Comment/# of Minutes: +2 - ambulated forward 13 feet Extremity Assessment  RUE Assessment RUE Assessment: Within Functional Limits LUE Assessment Active Range of Motion (AROM) Comments: 0-70degrees shoulder flexion General Strength Comments: 2+/5 RLE Assessment RLE Assessment: Exceptions to [facility] General Strength Comments: Grossly 2+/5 LLE Assessment LLE Assessment: Exceptions to [facility] General Strength Comments: Grossly 2/5  Care Tool Care Tool Bed Mobility Roll left and right activity   Roll left and right assist level: 2 Helpers    Sit to lying activity   Sit to lying assist level: 2 Helpers    Lying to sitting on side of bed activity   Lying to sitting on side of bed assist level: the ability to move from lying on the back to sitting on the side of the bed with no back support.: 2 Helpers     Care Tool Transfers Sit to stand transfer   Sit to stand assist level: 2 Helpers    Chair/bed transfer   Chair/bed transfer assist level: 2 Theatre stage manager transfer assist level: 2 helpers      Stage manager   Assist level: 2 helpers (+3 with WC follow) Assistive device:  (3 musketeers) Max distance: 4'  Walk 10 feet activity Walk 10 feet activity did not occur: Safety/medical concerns       Walk 50 feet with 2 turns activity Walk 50 feet with 2 turns activity did not occur: Safety/medical concerns      Walk 150 feet activity Walk 150 feet activity did not occur: Safety/medical concerns      Walk 10 feet on uneven surfaces activity Walk 10 feet on uneven surfaces activity did not occur: Safety/medical concerns      Stairs Stair activity did not occur: Safety/medical concerns        Walk up/down 1 step activity Walk up/down 1 step or curb (drop down) activity did not occur: Safety/medical concerns      Walk up/down 4 steps activity Walk up/down 4 steps activity did not occur: Safety/medical  concerns      Walk up/down 12 steps activity Walk up/down 12 steps activity did not occur: Safety/medical concerns      Pick up small objects from floor Pick up small object from the floor (from standing position) activity did not occur: Safety/medical concerns      Wheelchair Is the patient using a wheelchair?:  Yes     Wheelchair assist level: Dependent - Patient 0% Max wheelchair distance: 150'  Wheel 50 feet with 2 turns activity   Assist Level: Dependent - Patient 0%  Wheel 150 feet activity   Assist Level: Dependent - Patient 0%    Refer to Care Plan for Long Term Goals  SHORT TERM GOAL WEEK 1 PT Short Term Goal 1 (Week 1): Pt will complete bed mobility with maxA +1. PT Short Term Goal 2 (Week 1): Pt will complete bed to chair transfer with maxA +1. PT Short Term Goal 3 (Week 1): Pt will complete static sitting balance for ~5 minutes with CGA.  Recommendations for other services: Therapeutic Recreation  Other Dance  Skilled Therapeutic Intervention  Evaluation completed (see details above and below) with education on PT POC and goals and individual treatment initiated with focus on bed mobility, balance, transfers, car transfer, and initiation of gait. Pt received seated in tilt in space WC and agres to therapy. Reports pain all over during session but seems to indicate pain in abdomen moreso than anywhere else. PT provides rest breaks and repositioning to manage pain. WC transport to gym. Pt performs squat pivot transfer to car with maxA +2 and cues for initiation, sequencing and positioning. Following extended seated rest break, pt performs squat pivot to mat table with maxA+2 and same cues. Pt practices sitting balance training in short sitting on mat. Pt requires minA/modA overall for sitting balance and practices leaning onto Rt and lt forearms and returning to midline. Pt attempts ambulation and is able to progress 4' with totalA+2 and +3 WC follow for safety. Pt does not  clear either foot from ground and moans with discomfort and fear of falling during ambulation. Pt noted to have been incontinent. Squat pivot to bed with maxA +2 and dependent transition to supine. Pt preforms rolling Rt and Lt with maxA to change brief and pants, with cues for sequencing and body mechanics. Supine to sit with totalA. MaxA +2 back to WC. Pt attempts sit to stand in parallel bars with maxA +1. Pt has significant fear of falling and retropulsion, and is unable to complete transfer, but does clear buttocks from WC. Pt left seated in WC with all needs within reach.  Mobility Bed Mobility Bed Mobility: Sit to Supine;Supine to Sit Supine to Sit: 2 Helpers Sit to Supine: 2 Helpers Transfers Transfers: Stand to Sit;Sit to Stand Sit to Stand: 2 Helpers Stand to Sit: 2 Helpers Locomotion  Gait Ambulation: Yes Gait Assistance: 2 Helpers Gait Distance (Feet): 4 Feet Assistive device: Other (Comment) (3 Musketeers) Gait Assistance Details: Verbal cues for gait pattern;Verbal cues for technique;Tactile cues for sequencing;Tactile cues for weight shifting;Tactile cues for initiation Gait Gait: Yes Gait Pattern: Impaired Gait Pattern:  (dragged feet on ground) Stairs / Additional Locomotion Stairs: No Wheelchair Mobility Wheelchair Mobility: No   Discharge Criteria: Patient will be discharged from PT if patient refuses treatment 3 consecutive times without medical reason, if treatment goals not met, if there is a change in medical status, if patient makes no progress towards goals or if patient is discharged from hospital.  The above assessment, treatment plan, treatment alternatives and goals were discussed and mutually agreed upon: by patient and by family  Elsie JAYSON Dawn, PT, DPT 05/21/2024, 4:34 PM

## 2024-05-21 NOTE — Plan of Care (Signed)
 Problem: RH Balance Goal: LTG: Patient will maintain dynamic sitting balance (OT) Description: LTG:  Patient will maintain dynamic sitting balance with assistance during activities of daily living (OT) Flowsheets (Taken 05/21/2024 2043) LTG: Pt will maintain dynamic sitting balance during ADLs with: Contact Guard/Touching assist Goal: LTG Patient will maintain dynamic standing with ADLs (OT) Description: LTG:  Patient will maintain dynamic standing balance with assist during activities of daily living (OT)  Flowsheets (Taken 05/21/2024 2043) LTG: Pt will maintain dynamic standing balance during ADLs with: Moderate Assistance - Patient 50 - 74%   Problem: Sit to Stand Goal: LTG:  Patient will perform sit to stand in prep for activites of daily living with assistance level (OT) Description: LTG:  Patient will perform sit to stand in prep for activites of daily living with assistance level (OT) Flowsheets (Taken 05/21/2024 2043) LTG: PT will perform sit to stand in prep for activites of daily living with assistance level: Moderate Assistance - Patient 50 - 74%   Problem: RH Eating Goal: LTG Patient will perform eating w/assist, cues/equip (OT) Description: LTG: Patient will perform eating with assist, with/without cues using equipment (OT) Flowsheets (Taken 05/21/2024 2043) LTG: Pt will perform eating with assistance level of: Minimal Assistance - Patient > 75%   Problem: RH Grooming Goal: LTG Patient will perform grooming w/assist,cues/equip (OT) Description: LTG: Patient will perform grooming with assist, with/without cues using equipment (OT) Flowsheets (Taken 05/21/2024 2043) LTG: Pt will perform grooming with assistance level of: Minimal Assistance - Patient > 75%   Problem: RH Bathing Goal: LTG Patient will bathe all body parts with assist levels (OT) Description: LTG: Patient will bathe all body parts with assist levels (OT) Flowsheets (Taken 05/21/2024 2043) LTG: Pt will perform  bathing with assistance level/cueing: Moderate Assistance - Patient 50 - 74%   Problem: RH Dressing Goal: LTG Patient will perform upper body dressing (OT) Description: LTG Patient will perform upper body dressing with assist, with/without cues (OT). Flowsheets (Taken 05/21/2024 2043) LTG: Pt will perform upper body dressing with assistance level of: Moderate Assistance - Patient 50 - 74% Goal: LTG Patient will perform lower body dressing w/assist (OT) Description: LTG: Patient will perform lower body dressing with assist, with/without cues in positioning using equipment (OT) Flowsheets (Taken 05/21/2024 2043) LTG: Pt will perform lower body dressing with assistance level of: Maximal Assistance - Patient 25 - 49%   Problem: RH Toileting Goal: LTG Patient will perform toileting task (3/3 steps) with assistance level (OT) Description: LTG: Patient will perform toileting task (3/3 steps) with assistance level (OT)  Flowsheets (Taken 05/21/2024 2043) LTG: Pt will perform toileting task (3/3 steps) with assistance level: Maximal Assistance - Patient 25 - 49%   Problem: RH Vision Goal: RH LTG Vision Consulting civil engineer) Flowsheets (Taken 05/21/2024 2043) LTG: Vision Goals: Pt will attend to left field in ADL task with mod A   Problem: RH Functional Use of Upper Extremity Goal: LTG Patient will use RT/LT upper extremity as a (OT) Description: LTG: Patient will use right/left upper extremity as a stabilizer/gross assist/diminished/nondominant/dominant level with assist, with/without cues during functional activity (OT) Flowsheets (Taken 05/21/2024 2043) LTG: Use of upper extremity in functional activities: LUE as diminished level LTG: Pt will use upper extremity in functional activity with assistance level of: Contact Guard/Touching assist   Problem: RH Toilet Transfers Goal: LTG Patient will perform toilet transfers w/assist (OT) Description: LTG: Patient will perform toilet transfers with assist,  with/without cues using equipment (OT) Flowsheets (Taken 05/21/2024 2043) LTG: Pt will  perform toilet transfers with assistance level of: Moderate Assistance - Patient 50 - 74%   Problem: RH Tub/Shower Transfers Goal: LTG Patient will perform tub/shower transfers w/assist (OT) Description: LTG: Patient will perform tub/shower transfers with assist, with/without cues using equipment (OT) Flowsheets (Taken 05/21/2024 2043) LTG: Pt will perform tub/shower stall transfers with assistance level of: Maximal Assistance - Patient 25 - 49%   Problem: RH Attention Goal: LTG Patient will demonstrate this level of attention during functional activites (OT) Description: LTG:  Patient will demonstrate this level of attention during functional activites  (OT) Flowsheets (Taken 05/21/2024 2043) Patient will demonstrate this level of attention during functional activites: Selective Patient will demonstrate above attention level in the following environment: Home LTG: Patient will demonstrate this level of attention during functional activites (OT): Moderate Assistance - Patient 50 - 74%   Problem: RH Awareness Goal: LTG: Patient will demonstrate awareness during functional activites type of (OT) Description: LTG: Patient will demonstrate awareness during functional activites type of (OT) Flowsheets (Taken 05/21/2024 2043) Patient will demonstrate awareness during functional activites type of: Intellectual LTG: Patient will demonstrate awareness during functional activites type of (OT): Moderate Assistance - Patient 50 - 74%

## 2024-05-21 NOTE — Evaluation (Signed)
 Occupational Therapy Assessment and Plan  Patient Details  Name: Beth Macdonald MRN: [id number] Date of Birth: 20-Dec-1945  OT Diagnosis: apraxia, cognitive deficits, hemiplegia affecting non-dominant side, and muscle weakness (generalized) Rehab Potential: Rehab Potential (ACUTE ONLY): Good ELOS: ~ 2 weeks (~14 days)   Today's Date: 05/21/2024 OT Individual Time:  -  1015-1115 ( )       Hospital Problem: Principal Problem:   Seizure disorder as sequela of cerebrovascular accident [facility])   Past Medical History:  Past Medical History:  Diagnosis Date   Aortic atherosclerosis (HCC)    Diabetes mellitus without complication (HCC)    Type 2   Diabetic neuropathy (HCC)    DVT of proximal lower limb (HCC)    Facial paralysis/Bells palsy    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HCC)    Hyperlipidemia    Mitral valve insufficiency    Osteoarthritis    SCCA (squamous cell carcinoma) of skin 03/22/2021   Bridge of nose (in situ)   Skull fracture (HCC)    age 16   Squamous cell carcinoma of skin 03/22/2021   Right forehead (in situ)   TIA (transient ischemic attack) 2020   Varicose vein of leg    Past Surgical History:  Past Surgical History:  Procedure Laterality Date   ELBOW SURGERY Bilateral    20 yrs ago   ENDOVENOUS ABLATION SAPHENOUS VEIN W/ LASER Bilateral    ENDOVENOUS ABLATION SAPHENOUS VEIN W/ LASER Left 11/19/2019   endovenous laser ablation anterior accessory branch of left GSV and stab phlebectomy> 20 incisions left leg by Medford Blade MD    shoulder Bilateral    [phone]   TOENAIL EXCISION Right 2019   Big toe   TONSILLECTOMY Bilateral    TOTAL KNEE ARTHROPLASTY Right 01/01/2022   Procedure: TOTAL KNEE ARTHROPLASTY;  Surgeon: Melodi Lerner, MD;  Location: [facility];  Service: Orthopedics;  Laterality: Right;    Assessment & Plan Clinical Impression: Patient is a 78 y.o. 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 She had seizure enroute to ED and witnessed seizure with left side twitching in ED lasting 30-40 seconds. CT head negative. CTA head/neck showed R-MCA M2 branch occlusion with progression of stenosis rather than occlusion per Dr. Vanessa and neuroradiology. She was not a candidate for TNKase or thrombectomy She received IV ativan  and was loaded with Keppra . She developed hypoxic respiratory failure and was intubated for airway protection. MRI brain done revealing abnormal signal mesial right temporal and right hippocampal formation favored to relate to seizure/post ictal changes v/s ischemia or encephalitis. She was placed on LT-EEG and  LP done revealing mononuclear WBC without organisms and autoimmune panel ordered. CSF culture negative.    CT renal study showed bladder wall thickening c/w cystitis. Seizures felt to be in setting of UTI and stroke but patient continued to be encephalopathic. Respiratory culture positive and Ceftriaxone   X 5 days added on 09/11. ID consulted and as cultures negative did not see need for antibiotic regimen. MRI brain repeated on 09/10 and abnormal diffusion noted to be more pronounced T2 and FLAIR signal likely related to seizure activity and remote cortical infarcts. She has recurrent seizure  activity on 09/12 requiring versed  and IV solumedrol X 5 days recommended by neurology  due to concern of inflammatory etiology. She was placed back on LT-EEG which showed evidence of epileptogenicity and cortical dysfunction from right hemisphere. She had had issues with agitation requiring restraints.     Hospital course significant for ST elevation in V1 and V2  as well as acute  systolic failure. 2 D echo showed severe systolic HF question due to LAD  infarct v/s Takotsubo CM but presentation felt to be secondary to latter. She not felt to be cath candidate but question cardiac MRI in a few days.  Mentation slowly improving and she tolerated extubation by 09/14 and kept NPO due to confusion as well as hypophonia. MBS performed 09/16 and she was started on D2, honey liquids with aspiration precautions.  Dr. Ladona recommended DAPT if BP stable and spironolactone  added on 09/17. Ceftriaxone  completed on 09/15 but augmentin  added today for ??. Recommendations are to continue IV lasix  for 1-2 days then to transition to torsemide. She complains of left sided weakness.   Patient transferred to CIR on 05/20/2024 .    Patient currently requires total with basic self-care skills and +2 max A  secondary to muscle weakness, decreased cardiorespiratoy endurance, impaired timing and sequencing, unbalanced muscle activation, decreased coordination, and decreased motor planning, decreased visual acuity and decreased visual perceptual skills, decreased midline orientation, decreased attention to left, and decreased motor planning, decreased initiation, decreased attention, decreased awareness, decreased problem solving, decreased safety awareness, decreased memory, and delayed processing, and decreased sitting balance, decreased standing balance, decreased postural control, hemiplegia, and decreased balance strategies.  Prior to hospitalization, patient could complete ADL with supervision-min A.  Patient will benefit from skilled intervention to decrease level of assist with basic self-care skills and increase independence with basic self-care skills prior to discharge home with care partner.  Anticipate patient will require moderate physical assestance and and will d/c to SNF.  OT - End of Session Activity Tolerance: Tolerates 10 - 20 min activity with multiple rests Endurance Deficit: Yes OT  Assessment Rehab Potential (ACUTE ONLY): Good OT Barriers to Discharge: Decreased caregiver support OT Barriers to Discharge Comments: plans to d/c to SNF after rehab OT Patient demonstrates impairments in the following area(s): Balance;Behavior;Cognition;Edema;Endurance;Motor;Nutrition;Pain;Perception;Safety;Skin Integrity;Vision OT Basic ADL's Functional Problem(s): Eating;Grooming;Bathing;Dressing;Toileting OT Advanced ADL's Functional Problem(s): None OT Transfers Functional Problem(s): Toilet;Tub/Shower OT Additional Impairment(s): Fuctional Use of Upper Extremity OT Plan OT Intensity: Minimum of 1-2 x/day, 45 to 90 minutes OT Frequency: 5 out of 7 days OT Duration/Estimated Length of Stay: ~ 2 weeks (~14 days) OT Treatment/Interventions: Balance/vestibular training;Disease mangement/prevention;Neuromuscular re-education;Self Care/advanced ADL retraining;Therapeutic Exercise;Wheelchair propulsion/positioning;Cognitive remediation/compensation;DME/adaptive equipment instruction;Skin care/wound managment;Pain management;UE/LE Strength taining/ROM;Community reintegration;Patient/family education;Splinting/orthotics;UE/LE Coordination activities;Discharge planning;Functional mobility training;Psychosocial support;Therapeutic Activities;Visual/perceptual remediation/compensation;Functional electrical stimulation OT Self Feeding Anticipated Outcome(s): min A OT Basic Self-Care Anticipated Outcome(s): mod A OT Toileting Anticipated Outcome(s): max A OT Bathroom Transfers Anticipated Outcome(s): mod A OT Recommendation Recommendations for Other Services: Neuropsych consult Patient destination: Home Follow Up Recommendations: Skilled nursing facility Equipment Recommended: To be determined   OT Evaluation Precautions/Restrictions  Precautions Precautions: Fall;Other (comment) Recall of Precautions/Restrictions: Impaired Precaution/Restrictions Comments: L inattention Restrictions Weight  Bearing Restrictions Per Provider Order: No Pain  Ongoing c/o pain in bilateral LEs Home Living/Prior Functioning Home Living Family/patient expects to be discharged to:: Private residence Living Arrangements: Spouse/significant other Available Help at Discharge: Family, Available 24 hours/day, Friend(s) Type of Home: House Home Access: Level entry Home Layout: One level Bathroom  Shower/Tub: Health visitor: Standard Bathroom Accessibility: Yes Additional Comments: states RW is broken?  Lives With: Spouse Vision Baseline Vision/History: 1 Wears glasses (none for the eval) Ability to See in Adequate Light: 1 Impaired Patient Visual Report: Blurring of vision Vision Assessment?: Vision impaired- to be further tested in functional context Perception  Perception: Impaired Praxis Praxis: Impaired Praxis Impairment Details: Ideomotor;Motor planning Cognition Cognition Overall Cognitive Status: History of cognitive impairments - at baseline Arousal/Alertness: Awake/alert Orientation Level: Person;Place;Nonverbal/unable to assess;Situation Person: Oriented Place: Disoriented Situation: Disoriented Memory: Impaired Memory Impairment: Decreased recall of new information;Decreased short term memory Attention: Sustained Sustained Attention: Impaired Sustained Attention Impairment: Functional basic Awareness: Impaired Awareness Impairment: Intellectual impairment Problem Solving: Impaired Problem Solving Impairment: Functional basic Safety/Judgment: Impaired Brief Interview for Mental Status (BIMS) Repetition of Three Words (First Attempt): 3 Temporal Orientation: Year: Missed by more than 5 years Temporal Orientation: Month: Missed by more than 1 month Temporal Orientation: Day: Incorrect Recall: Sock: Yes, no cue required Recall: Blue: Yes, after cueing (a color) Recall: Bed: No, could not recall BIMS Summary Score: 6 Sensation Sensation Light Touch:  Impaired Detail Light Touch Impaired Details: Impaired LUE Proprioception: Impaired Detail Proprioception Impaired Details: Impaired LUE;Impaired LLE Coordination Gross Motor Movements are Fluid and Coordinated: No Fine Motor Movements are Fluid and Coordinated: No Motor  Motor Motor: Hemiplegia;Abnormal postural alignment and control  Trunk/Postural Assessment  Cervical Assessment Cervical Assessment: Exceptions to [facility] (flexed forward) Thoracic Assessment Thoracic Assessment: Exceptions to [facility] (rounded forward) Lumbar Assessment Lumbar Assessment: Exceptions to [facility] (posterior pelvic tilt) Postural Control Postural Control: Deficits on evaluation Righting Reactions: delayed Protective Responses: delayed  Balance Balance Balance Assessed: Yes Static Sitting Balance Static Sitting - Level of Assistance: 3: Mod assist Dynamic Sitting Balance Dynamic Sitting - Level of Assistance: 3: Mod assist;2: Max assist Sitting balance - Comments: R lateral lean requiring assist to correct despite cues for hand placement Static Standing Balance Static Standing - Level of Assistance: 2: Max assist Static Standing - Comment/# of Minutes: +2 - ambulated forward 13 feet Extremity/Trunk Assessment RUE Assessment RUE Assessment: Within Functional Limits LUE Assessment Active Range of Motion (AROM) Comments: 0-70degrees shoulder flexion General Strength Comments: 2+/5  Care Tool Care Tool Self Care Eating   Eating Assist Level: Total Assistance - Patient < 25%    Oral Care    Oral Care Assist Level: Total assistance - Patient < 25%    Bathing         Assist Level: 2 Helpers    Upper Body Dressing(including orthotics)       Assist Level: Total Assistance - Patient < 25%    Lower Body Dressing (excluding footwear)   What is the patient wearing?: Pants;Incontinence brief Assist for lower body dressing: 2 Helpers    Putting on/Taking off footwear   What is the patient wearing?:  Non-skid slipper socks Assist for footwear: Dependent - Patient 0%       Care Tool Toileting Toileting activity   Assist for toileting: 2 Helpers     Care Tool Bed Mobility Roll left and right activity   Roll left and right assist level: 2 Helpers    Sit to lying activity   Sit to lying assist level: 2 Helpers    Lying to sitting on side of bed activity   Lying to sitting on side of bed assist level: the ability to move from lying on the back to sitting on the side of the bed with no back support.: 2  Helpers     Care Tool Transfers Sit to stand transfer Sit to stand activity did not occur: Safety/medical concerns Sit to stand assist level: 2 Helpers    Chair/bed transfer Chair/bed transfer activity did not occur: Safety/medical concerns Chair/bed transfer assist level: 2 Chief Strategy Officer transfer activity did not occur: Safety/medical concerns       Care Tool Cognition  Expression of Ideas and Wants Expression of Ideas and Wants: 2. Frequent difficulty - frequently exhibits difficulty with expressing needs and ideas  Understanding Verbal and Non-Verbal Content Understanding Verbal and Non-Verbal Content: 2. Sometimes understands - understands only basic conversations or simple, direct phrases. Frequently requires cues to understand   Memory/Recall Ability Memory/Recall Ability : Current season;That he or she is in a hospital/hospital unit   Refer to Care Plan for Long Term Goals  SHORT TERM GOAL WEEK 1 OT Short Term Goal 1 (Week 1): Pt able to sit EOB/EOM with min A statically in prep for ADL task OT Short Term Goal 2 (Week 1): Pt will don shirt with max A OT Short Term Goal 3 (Week 1): Pt will transfer to w/c out of bed with max A +1 OT Short Term Goal 4 (Week 1): Pt will be oriented x3 with min external aids OT Short Term Goal 5 (Week 1): Pt will use bilateral hands in grooming tasks with mod A  Recommendations for other services: Neuropsych    Skilled Therapeutic Intervention 1:1 Ot eval initiated with pt's role, goals and purpose discussed with pt and pt's spouse [name]. Pt received in the bed. Pt at first didn't verbalize anything but with encouragement would answer with a soft spoken voice. Discontinued periwick and performed hygiene and donned brief and pants with rolling with max A +2 with extra time. Pt with noted decr attention to left visually and with body. With rolling nystagmus noted with pt reports dizziness but quickly resolved. Pt transitioned to EOB with max A. Pt required varrying assistance for static sitting balance - min to max A . Had patient bend forward to reinforce forward weight shift while assisting with donning pants. Sit to stand attempt with therapist in front of pt - pt too fearful. Three musketeers with +2 with mod A and total A to pull up pants. Pt fearful of movement and standing. Pt was able to stand in same method again and took steps forward with max to total multimodal cuing - ambulating from EOB towards door 13 feet. TIS w/c for optimal positioning for safety . Sitting in the w/c pt with more forward weight shift and difficulty sitting back in the chair. At the sink bathe UB and donned clean gown and performed oral care with total A. Pt left sitting up w/c in tilted position with wife at bed side.   ADL ADL Grooming: Maximal assistance Upper Body Bathing: Maximal assistance Lower Body Bathing: Dependent Where Assessed-Lower Body Bathing: Sitting at sink;Bed level Upper Body Dressing: Maximal assistance Lower Body Dressing: Dependent (+2) Toileting: Not assessed (+2) Mobility  Bed Mobility Bed Mobility: Sit to Supine;Supine to Sit Supine to Sit: 2 Helpers Sit to Supine: 2 Helpers Transfers Sit to Stand: 2 Helpers Stand to Sit: 2 Helpers   Discharge Criteria: Patient will be discharged from OT if patient refuses treatment 3 consecutive times without medical reason, if treatment goals not met, if  there is a change in medical status, if patient makes no progress towards goals or if patient is discharged from  hospital.  The above assessment, treatment plan, treatment alternatives and goals were discussed and mutually agreed upon: by patient and pt's spouse  Claudene Delon Levy 05/21/2024, 3:44 PM

## 2024-05-21 NOTE — Progress Notes (Signed)
 Inpatient Rehabilitation  Patient information reviewed and entered into eRehab system by [facility]. Karen Kays., CCC/SLP, PPS Coordinator.  Information including medical coding, functional ability and quality indicators will be reviewed and updated through discharge.

## 2024-05-21 NOTE — Progress Notes (Addendum)
 Beth Sven SQUIBB, MD  Physician Physical Medicine and Rehabilitation   PMR Pre-admission    Signed   Date of Service: 05/20/2024 10:33 AM  Related encounter: ED to Hosp-Admission (Discharged) from 05/11/2024 in [facility]   Signed     Expand All Collapse All  PMR Admission Coordinator Pre-Admission Assessment   Patient: Beth Macdonald is an 78 y.o., female MRN: [id number] DOB: 04/02/1946 Height: 5' 8 (172.7 cm) Weight: 75 kg   Insurance Information HMO:     PPO:      PCP:      IPA:      80/20:      OTHER:  PRIMARY: [employer]     Policy#: [id number]       Subscriber: pt CM Name:       Phone#:      Fax#:  Pre-Cert#: verified Health and safety inspector:  Benefits:  Phone #:      Name:  Eff. Date: 03/03/09 A/B     Deduct: $1676      Out of Pocket Max: n/a      Life Max:  CIR: 100%      SNF: 20 full days Outpatient: 80%     Co-Pay: 20% Home Health: 100%      Co-Pay:  DME: 80%     Co-Pay: 20% Providers:  SECONDARYBETHA BRIGHTER      Policy#: [id number]      Phone#: [phone]   Financial Counselor:       Phone#:    The "Data Collection Information Summary" for patients in Inpatient Rehabilitation Facilities with attached "Privacy Act Statement-Health Care Records" was provided and verbally reviewed with: Patient and Family   Emergency Contact Information Contact Information       Name Relation Home Work [place] Spouse     [phone]         Other Contacts       Name Relation Home Work Mobile    Shur,[name] Friend     [phone]    Sibley,Susan Niece     [phone]           Current Medical History  Patient Admitting Diagnosis: encephalopathy   History of Present Illness: Pt is an 79 y/o female with PMH of DM, neuropathy, DVT, fibromuscular hyperplasia, R MCA stenosis, 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  She had another seizure en route to ED and again in the ED lasting 30-40 seconds.  CTA head/neck showed R MCA occlusion with progression of stenosis since previous study.  She received ativan  and keppra , but developed hypoxic respiratory failure requiring intubation on 9/8. MRI revealed abnormal signal in the mesial right temporal and right hippocampal formation favored to be related to seizure/post ictal changes.  LP done revealing mononuclear WBC without organisms.  CT renal study c/w cystitis.  Seizures felt to be in setting of UTI and stroke, but pt's encephalopathy persisted.  She started a 5 day course of ceftriaxone  on 9/11.  Hospital course significant for ST elevation and systolic heart failure.  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  She was not a cath candidate and cardiology signed off.  She was extubated on 9/14 and is saturating well on room air.  MBSS  on 9/16 and she was started on D2/honey diet with aspiration precautions.  Therapy ongoing and pt has been recommended for CIR>    Complete NIHSS TOTAL: 14   Patient's medical record from [facility] has been reviewed by the rehabilitation admission coordinator and physician.   Past Medical History      Past Medical History:  Diagnosis Date   Aortic atherosclerosis (HCC)     Diabetes mellitus without complication (HCC)      Type 2   Diabetic neuropathy (HCC)     DVT of proximal lower limb (HCC)     Facial paralysis/Bells palsy     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HCC)     Hyperlipidemia     Mitral valve insufficiency     Osteoarthritis     SCCA (squamous cell carcinoma) of skin 03/22/2021    Bridge of nose (in situ)   Skull fracture (HCC)      age 49   Squamous cell carcinoma of skin 03/22/2021    Right forehead (in situ)   TIA (transient ischemic attack) 2020   Varicose vein of leg            Has the patient had  major surgery during 100 days prior to admission? No   Family History   family history includes Dementia in her father; Hypertension in her father; Leukemia in her brother; Stroke in her brother and mother.   Current Medications  Current Medications    Current Facility-Administered Medications:    acetaminophen  (TYLENOL ) tablet 1,000 mg, 1,000 mg, Oral, Q8H PRN, Dewald, Jonathan B, MD, 1,000 mg at 05/20/24 0025   aspirin  chewable tablet 81 mg, 81 mg, Oral, Daily, Kara Dorn NOVAK, MD, 81 mg at 05/19/24 0940   atorvastatin  (LIPITOR) tablet 20 mg, 20 mg, Oral, Daily, Dewald, Jonathan B, MD, 20 mg at 05/19/24 0941   Chlorhexidine  Gluconate Cloth 2 % PADS 6 each, 6 each, Topical, Daily, Ilah Krabbe M, PA-C, 6 each at 05/18/24 0911   clopidogrel  (PLAVIX ) tablet 75 mg, 75 mg, Oral, Daily, Kara Dorn B, MD, 75 mg at 05/19/24 0940   enoxaparin  (LOVENOX ) injection 30 mg, 30 mg, Subcutaneous, Q24H, Reome, Earle J, RPH   feeding supplement (KATE FARMS STANDARD ENT 1.4) liquid 325 mL, 325 mL, Oral, BID BM, Chand, Sudham, MD, 325 mL at 05/19/24 1427   furosemide  (LASIX ) injection 40 mg, 40 mg, Intravenous, BID, Joseph, Preetha, MD, 40 mg at 05/20/24 0843   haloperidol  lactate (HALDOL ) injection 2-5 mg, 2-5 mg, Intravenous, Q6H PRN, Claudene Toribio BROCKS, MD, 2 mg at 05/18/24 1800   insulin  aspart (novoLOG ) injection 0-20 Units, 0-20 Units, Subcutaneous, Q4H, Dewald, Jonathan B, MD, 3 Units at 05/20/24 0443   insulin  glargine (LANTUS ) injection 8 Units, 8 Units, Subcutaneous, BID, Harold Scholz, MD, 8 Units at 05/19/24 2119   levETIRAcetam  (KEPPRA ) undiluted injection 500 mg, 500 mg, Intravenous, Q12H, Yadav, Priyanka O, MD, 500 mg at 05/20/24 9362   multivitamin with minerals tablet 1 tablet, 1 tablet, Oral, Daily, Dewald, Jonathan B, MD, 1 tablet at 05/19/24 0940   nystatin  (MYCOSTATIN /NYSTOP ) topical powder, , Topical, BID, Yadav, Priyanka O, MD, Given at 05/19/24 1638   ondansetron  (ZOFRAN )  injection 4 mg, 4 mg, Intravenous, Q6H PRN, Ilah Krabbe M, PA-C, 4 mg at 05/13/24 1717   Oral care mouth rinse, 15 mL, Mouth Rinse, PRN, Dewald, Jonathan B, MD   Oral care mouth rinse, 15 mL, Mouth Rinse, 3 times per day, Shelton Arlin KIDD, MD, 15 mL at  05/20/24 0843   QUEtiapine  (SEROQUEL ) tablet 12.5 mg, 12.5 mg, Oral, QHS, Yadav, Priyanka O, MD, 12.5 mg at 05/19/24 1757   QUEtiapine  (SEROQUEL ) tablet 12.5 mg, 12.5 mg, Oral, QHS PRN, Yadav, Priyanka O, MD, 12.5 mg at 05/20/24 0025     Patients Current Diet:  Diet Order                  DIET DYS 2 Room service appropriate? No; Fluid consistency: Honey Thick  Diet effective now                         Precautions / Restrictions Precautions Precautions: Fall, Other (comment) Precaution/Restrictions Comments: L inattention Restrictions Weight Bearing Restrictions Per Provider Order: No    Has the patient had 2 or more falls or a fall with injury in the past year? Yes   Prior Activity Level Community (5-7x/wk): prior to CVA in August was independent without DME.   Prior Functional Level Self Care: Did the patient need help bathing, dressing, using the toilet or eating? Independent   Indoor Mobility: Did the patient need assistance with walking from room to room (with or without device)? Independent   Stairs: Did the patient need assistance with internal or external stairs (with or without device)? Independent   Functional Cognition: Did the patient need help planning regular tasks such as shopping or remembering to take medications? Independent   Patient Information Are you of Hispanic, Latino/a,or Spanish origin?: A. No, not of Hispanic, Latino/a, or Spanish origin What is your race?: A. White Do you need or want an interpreter to communicate with a doctor or health care staff?: 0. No Patient information obtained via proxy : no   Patient's Response To:  Health Literacy and Transportation Is the patient able to  respond to health literacy and transportation needs?: Yes Health Literacy - How often do you need to have someone help you when you read instructions, pamphlets, or other written material from your doctor or pharmacy?: Never In the past 12 months, has lack of transportation kept you from medical appointments or from getting medications?: No In the past 12 months, has lack of transportation kept you from meetings, work, or from getting things needed for daily living?: No Higher education careers adviser obtained via proxy: no    Journalist, newspaper / Equipment Home Equipment: Cane - single point, Agricultural consultant (2 wheels), Shower seat, Grab bars - tub/shower, Government social research officer   Prior Device Use: Indicate devices/aids used by the patient prior to current illness, exacerbation or injury? cane   Current Functional Level Cognition   Arousal/Alertness: Awake/alert Overall Cognitive Status: History of cognitive impairments - at baseline Orientation Level: Oriented to person, Oriented to place, Disoriented to time, Disoriented to situation Attention: Sustained Sustained Attention: Impaired Sustained Attention Impairment: Functional basic Memory: Impaired Memory Impairment: Decreased recall of new information Awareness: Impaired Awareness Impairment: Intellectual impairment Problem Solving: Impaired Problem Solving Impairment: Functional basic    Extremity Assessment (includes Sensation/Coordination)   Upper Extremity Assessment: Generalized weakness, LUE deficits/detail, Right hand dominant LUE Deficits / Details: 3/5 globally weak compared to RUE LUE Sensation: WNL LUE Coordination: decreased fine motor, decreased gross motor  Lower Extremity Assessment: Defer to PT evaluation RLE Deficits / Details: difficult to fully assess due to cognitive deficits and poor command following along with L inattention, but grossly symmetrical in strength bil with gross MMT scores of 3+ to 4-; denied  feeling light touch at dorsal feet  bil but detected touch at lower legs, wife reports hx of plantar feet sensation deficits but not dorsal deficits in sensation RLE Sensation: decreased light touch RLE Coordination: decreased fine motor, decreased gross motor LLE Deficits / Details: difficult to fully assess due to cognitive deficits and poor command following along with L inattention, but grossly symmetrical in strength bil with gross MMT scores of 3+ to 4-; denied feeling light touch at dorsal feet bil but detected touch at lower legs, wife reports hx of plantar feet sensation deficits but not dorsal deficits in sensation LLE Sensation: decreased light touch LLE Coordination: decreased gross motor, decreased fine motor     ADLs   Overall ADL's : Needs assistance/impaired Eating/Feeding: Set up, Sitting Grooming: Set up, Sitting Upper Body Bathing: Minimal assistance, Sitting Lower Body Bathing: Moderate assistance Upper Body Dressing : Minimal assistance Lower Body Dressing: Maximal assistance Toilet Transfer: Maximal assistance, +2 for physical assistance Toileting- Clothing Manipulation and Hygiene: Maximal assistance Functional mobility during ADLs: Maximal assistance, +2 for physical assistance     Mobility   Overal bed mobility: Needs Assistance Bed Mobility: Supine to Sit Rolling: Used rails, Mod assist Supine to sit: Max assist General bed mobility comments: Dense cues for sequencing and max A to sit to EOB     Transfers   Overall transfer level: Needs assistance Equipment used: Pushed w/c Transfers: Sit to/from Stand, Bed to chair/wheelchair/BSC Sit to Stand: Max assist, +2 safety/equipment Bed to/from chair/wheelchair/BSC transfer type:: Via Lift equipment Stand pivot transfers: Max Banker via Lift Equipment: Stedy General transfer comment: Attempted STS from EOB x2, but unable to clear hips without +2. Pt demonstrates R lateral lean and requires dense cues and  assist for hand placement     Ambulation / Gait / Stairs / Wheelchair Mobility   Ambulation/Gait General Gait Details: unable at this time     Posture / Balance Dynamic Sitting Balance Sitting balance - Comments: R lateral lean requiring assist to correct despite cues for hand placement Balance Overall balance assessment: Needs assistance Sitting-balance support: Bilateral upper extremity supported, Feet supported Sitting balance-Leahy Scale: Poor Sitting balance - Comments: R lateral lean requiring assist to correct despite cues for hand placement Postural control: Right lateral lean, Posterior lean, Other (comment) (anterior lean) Standing balance support: Bilateral upper extremity supported, During functional activity, Reliant on assistive device for balance Standing balance-Leahy Scale: Zero Standing balance comment: reliant on external support     Special considerations/life events  Diabetic management yes    Previous Home Environment (from acute therapy documentation) Living Arrangements: Spouse/significant other  Lives With: Spouse Available Help at Discharge: Family, Available 24 hours/day, Friend(s) Type of Home: House Home Layout: One level Home Access: Level entry Bathroom Shower/Tub: Health visitor: Standard Bathroom Accessibility: Yes How Accessible: Accessible via walker   Discharge Living Setting Plans for Discharge Living Setting: Patient's home, Lives with (comment) (spouse) Type of Home at Discharge: House Discharge Home Layout: One level Discharge Home Access: Level entry Discharge Bathroom Shower/Tub: Walk-in shower Discharge Bathroom Toilet: Standard Discharge Bathroom Accessibility: Yes How Accessible: Accessible via walker   Social/Family/Support Systems Anticipated Caregiver: Alfreda Pao Anticipated Caregiver's Contact Information: [phone] Ability/Limitations of Caregiver: can provide min A Caregiver Availability:  24/7 Discharge Plan Discussed with Primary Caregiver: Yes Is Caregiver In Agreement with Plan?: Yes Does Caregiver/Family have Issues with Lodging/Transportation while Pt is in Rehab?: No   Goals Patient/Family Goal for Rehab: PT/OT min assist, SLP supervision Expected length of stay: 14-21 days Pt/Family  Agrees to Admission and willing to participate: Yes Program Orientation Provided & Reviewed with Pt/Caregiver Including Roles  & Responsibilities: Yes   Decrease burden of Care through IP rehab admission: Diet advancement, Decrease number of caregivers, and Bowel and bladder program   Possible need for SNF placement upon discharge: Likely.  Pt's s/o able to provide very little in the way of physical assist at discharge.  Would benefit from short CIR stay to decrease burden of care with planned d/c to SNF for ongoing rehabilitation.    Patient Condition: I have reviewed medical records from [facility], spoken with CSW, and patient and spouse. I met with patient at the bedside for inpatient rehabilitation assessment.  Patient will benefit from ongoing PT, OT, and SLP, can actively participate in 3 hours of therapy a day 5 days of the week, and can make measurable gains during the admission.  Patient will also benefit from the coordinated team approach during an Inpatient Acute Rehabilitation admission.  The patient will receive intensive therapy as well as Rehabilitation physician, nursing, social worker, and care management interventions.  Due to bladder management, bowel management, safety, skin/wound care, disease management, medication administration, pain management, and patient education the patient requires 24 hour a day rehabilitation nursing.  The patient is currently max +2 with mobility and basic ADLs.  Discharge setting and therapy post discharge at skilled nursing facility is anticipated.  Patient has agreed to participate in the Acute Inpatient Rehabilitation Program and will admit  today.   Preadmission Screen Completed By:  Reche FORBES Lowers, PT, DPT 05/20/2024 10:33 AM ______________________________________________________________________   Discussed status with Dr. Lorilee on 05/20/24  at 10:33 AM  and received approval for admission today.   Admission Coordinator:  Raziel Koenigs E Mcguire Gasparyan, PT, DPT time 10:33 AM Pattricia  05/20/24     Assessment/Plan: Diagnosis: Focal seizures Does the need for close, 24 hr/day Medical supervision in concert with the patient's rehab needs make it unreasonable for this patient to be served in a less intensive setting? Yes Co-Morbidities requiring supervision/potential complications:  Overweight Encephalopathy DM Neuropathy DVT Fibromuscular hyperplasia Due to bladder management, bowel management, safety, skin/wound care, disease management, medication administration, pain management, and patient education, does the patient require 24 hr/day rehab nursing? Yes Does the patient require coordinated care of a physician, rehab nurse, PT, OT, and SLP to address physical and functional deficits in the context of the above medical diagnosis(es)? Yes Addressing deficits in the following areas: balance, endurance, locomotion, strength, transferring, bowel/bladder control, bathing, dressing, feeding, grooming, toileting, cognition, and psychosocial support Can the patient actively participate in an intensive therapy program of at least 3 hrs of therapy 5 days a week? Yes The potential for patient to make measurable gains while on inpatient rehab is excellent Anticipated functional outcomes upon discharge from inpatient rehab: max assist PT, max assist OT, max assist SLP Estimated rehab length of stay to reach the above functional goals is: 10-14 days Anticipated discharge destination: Home 10. Overall Rehab/Functional Prognosis: excellent     MD Signature: Sven Lorilee, MD         Revision History

## 2024-05-21 NOTE — Evaluation (Signed)
 Speech Language Pathology Assessment and Plan  Patient Details  Name: Beth Macdonald MRN: [id number] Date of Birth: Oct 10, 1945  SLP Diagnosis: Aphasia;Cognitive Impairments;Speech and Language deficits;Dysphagia  Rehab Potential: Fair ELOS: 2 weeks    Today's Date: 05/21/2024 SLP Individual Time: 0900-1000 SLP Individual Time Calculation (min): 60 min   Hospital Problem: Principal Problem:   Seizure disorder as sequela of cerebrovascular accident [facility])  Past Medical History:  Past Medical History:  Diagnosis Date   Aortic atherosclerosis (HCC)    Diabetes mellitus without complication (HCC)    Type 2   Diabetic neuropathy (HCC)    DVT of proximal lower limb (HCC)    Facial paralysis/Bells palsy    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HCC)    Hyperlipidemia    Mitral valve insufficiency    Osteoarthritis    SCCA (squamous cell carcinoma) of skin 03/22/2021   Bridge of nose (in situ)   Skull fracture (HCC)    age 6   Squamous cell carcinoma of skin 03/22/2021   Right forehead (in situ)   TIA (transient ischemic attack) 2020   Varicose vein of leg    Past Surgical History:  Past Surgical History:  Procedure Laterality Date   ELBOW SURGERY Bilateral    20 yrs ago   ENDOVENOUS ABLATION SAPHENOUS VEIN W/ LASER Bilateral    ENDOVENOUS ABLATION SAPHENOUS VEIN W/ LASER Left 11/19/2019   endovenous laser ablation anterior accessory branch of left GSV and stab phlebectomy> 20 incisions left leg by Medford Blade MD    shoulder Bilateral    [phone]   TOENAIL EXCISION Right 2019   Big toe   TONSILLECTOMY Bilateral    TOTAL KNEE ARTHROPLASTY Right 01/01/2022   Procedure: TOTAL KNEE ARTHROPLASTY;  Surgeon: Melodi Lerner, MD;  Location: [facility];  Service: Orthopedics;  Laterality: Right;    Assessment / Plan / Recommendation Clinical Impression Pt is an 78 year old female with 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 She had seizure enroute to ED and witnessed seizure with left side twitching in ED lasting 30-40 seconds. CT head negative. CTA head/neck showed R-MCA M2 branch occlusion with progression of stenosis rather than occlusion per Dr. Vanessa and neuroradiology. She was not a candidate for TNKase or thrombectomy She received IV ativan  and was loaded with Keppra . She developed hypoxic respiratory failure and was intubated for airway protection. MRI brain done revealing abnormal signal mesial right temporal and right hippocampal formation favored to relate to seizure/post ictal changes v/s ischemia or encephalitis. She was placed on LT-EEG and LP done revealing mononuclear WBC without organisms and autoimmune panel ordered. CSF culture negative.   Bedside Swallow Eval:  Oral care provided prior to initiation of PO trials. Of note, RN reported walking in the room to find pt drinking the water  from her flower vase. SLP then completed oral care, where she required totalA for thoroughness. Given oral residue from drinking the vase water , majority of eval time was spent on oral care. PO trials completed w/ ice chips, thin liquids (water ), mildly thick liquids, honey thick liquids, and puree. No overt s/s of airway invasion noted w/ ice chips, tspn trials of nectar thick liquids, honey thick liquids, and puree. She presented  w/ immediate cough during 1/3 thin liquid trials and 1/3 nectar thick liquids. She required hand over hand assist for self feeding throughout trials. Recommend continued Dys 2 textures w/ Honey Thick Liquids (HTL). Full supervision during meals. Meds crushed in puree.   Cognitive-Linguistic: Informal eval tasks completed. Pt was  initially nonverbal, responding w/ only gestures or nods. However, she did initiate verbal responses after oral care. Anticipate arrival of her spouse assisted w/ initiation as well. She demonstrated severe to profound deficits in the areas of orientation, problem solving, and sustained attention. Anticipate STM deficits as well, though difficult to differentiate language vs cog deficits given severity of deficits and limited verbal output. Speech was intelligible.   She would benefit from continued ST to target cognitive-linguistic deficits and dysphagia, maximize pt independence, and reduce caregiver opinion.     Skilled Therapeutic Interventions          SLP facilitated a bedside swallow and brief cognitive-linguistic eval to assess pt's cognitive-communication skills and determine need for additional skilled ST services. See above for more information.     SLP Assessment  Patient will need skilled Speech Lanaguage Pathology Services during CIR admission    Recommendations  SLP Diet Recommendations: Dysphagia 2 (Fine chop);Honey Liquid Administration via: Cup Medication Administration: Crushed with puree Supervision: Full supervision/cueing for compensatory strategies;Staff to assist with self feeding Compensations: Minimize environmental distractions;Slow rate;Small sips/bites Postural Changes and/or Swallow Maneuvers: Seated upright 90 degrees;Upright 30-60 min after meal Oral Care Recommendations: Oral care QID Recommendations for Other Services: Therapeutic Recreation consult Therapeutic Recreation Interventions: Pet therapy Patient destination: Skilled Nursing Facility (SNF) Follow up Recommendations: 24 hour supervision/assistance;Skilled Nursing facility Equipment Recommended: None recommended by SLP    SLP Frequency 3 to 5 out of 7 days   SLP Duration  SLP Intensity  SLP Treatment/Interventions 2 weeks  Minumum of 1-2 x/day, 30 to 90 minutes  Cognitive  remediation/compensation;Cueing hierarchy;Functional tasks;Therapeutic Activities;Therapeutic Exercise;Dysphagia/aspiration precaution training;Patient/family education;Speech/Language facilitation;Multimodal communication approach    Pain  None reported  Prior Functioning Type of Home: House  Lives With: Spouse Available Help at Discharge: Family;Available 24 hours/day;Friend(s)  SLP Evaluation Cognition Overall Cognitive Status: History of cognitive impairments - at baseline Arousal/Alertness: Awake/alert Orientation Level: Oriented to person Attention: Sustained Sustained Attention: Impaired Sustained Attention Impairment: Functional basic Memory: Impaired Memory Impairment: Storage deficit Awareness: Impaired Awareness Impairment: Intellectual impairment Problem Solving: Impaired Problem Solving Impairment: Functional basic Safety/Judgment: Impaired  Comprehension Auditory Comprehension Overall Auditory Comprehension: Appears within functional limits for tasks assessed Expression Expression Primary Mode of Expression: Verbal Verbal Expression Overall Verbal Expression: Impaired Initiation: Impaired Interfering Components: Attention;Premorbid deficit Non-Verbal Means of Communication: Gestures Oral Motor Oral Motor/Sensory Function Overall Oral Motor/Sensory Function: Moderate impairment Facial ROM: Reduced right Facial Symmetry: Within Functional Limits Facial Strength: Reduced right Motor Speech Overall Motor Speech: Appears within functional limits for tasks assessed  Care Tool Care Tool Cognition Ability to hear (with hearing aid or hearing appliances if normally used Ability to hear (with hearing aid or hearing appliances if normally used): 0. Adequate - no difficulty in normal conservation, social interaction, listening to TV   Expression of Ideas and Wants Expression of Ideas and Wants: 2. Frequent difficulty - frequently exhibits difficulty with expressing  needs and ideas   Understanding Verbal and Non-Verbal Content Understanding Verbal and Non-Verbal Content: 2. Sometimes understands - understands only basic conversations or simple, direct phrases. Frequently requires cues to understand  Memory/Recall Ability Memory/Recall Ability : That he or she is in a hospital/hospital  unit   Motor Speech Assessment  Intelligible   Bedside Swallowing Assessment See clinical impression  Short Term Goals: Week 1: SLP Short Term Goal 1 (Week 1): Pt will tolerate trials of nectar thick liquids w/ no overt s/s of airway invasion 80% of the time or greater SLP Short Term Goal 2 (Week 1): Pt will tolerate trials of thin liquids w/ no overt s/s of airway invasion 60% of the time or greater SLP Short Term Goal 3 (Week 1): Pt will solve functional problems w/ maxA SLP Short Term Goal 4 (Week 1): Pt will complete functional naming tasks w/ maxA SLP Short Term Goal 5 (Week 1): Pt will utilize visual aids as needed to provide orientation information w/ maxA  Refer to Care Plan for Long Term Goals  Recommendations for other services: Therapeutic Recreation  Pet therapy  Discharge Criteria: Patient will be discharged from SLP if patient refuses treatment 3 consecutive times without medical reason, if treatment goals not met, if there is a change in medical status, if patient makes no progress towards goals or if patient is discharged from hospital.  The above assessment, treatment plan, treatment alternatives and goals were discussed and mutually agreed upon: by patient and by family  [name] 05/21/2024, 7:32 PM

## 2024-05-22 ENCOUNTER — Inpatient Hospital Stay (HOSPITAL_COMMUNITY): Payer: MEDICARE

## 2024-05-22 DIAGNOSIS — G40909 Epilepsy, unspecified, not intractable, without status epilepticus: Secondary | ICD-10-CM | POA: Diagnosis not present

## 2024-05-22 DIAGNOSIS — I69398 Other sequelae of cerebral infarction: Secondary | ICD-10-CM | POA: Diagnosis not present

## 2024-05-22 DIAGNOSIS — M25461 Effusion, right knee: Secondary | ICD-10-CM | POA: Diagnosis not present

## 2024-05-22 DIAGNOSIS — Z96651 Presence of right artificial knee joint: Secondary | ICD-10-CM | POA: Diagnosis not present

## 2024-05-22 DIAGNOSIS — M47816 Spondylosis without myelopathy or radiculopathy, lumbar region: Secondary | ICD-10-CM | POA: Diagnosis not present

## 2024-05-22 DIAGNOSIS — M25551 Pain in right hip: Secondary | ICD-10-CM | POA: Diagnosis not present

## 2024-05-22 DIAGNOSIS — M25561 Pain in right knee: Secondary | ICD-10-CM | POA: Diagnosis not present

## 2024-05-22 LAB — GLUCOSE, CAPILLARY
Glucose-Capillary: 153 mg/dL — ABNORMAL HIGH (ref 70–99)
Glucose-Capillary: 132 mg/dL — ABNORMAL HIGH (ref 70–99)
Glucose-Capillary: 199 mg/dL — ABNORMAL HIGH (ref 70–99)
Glucose-Capillary: 171 mg/dL — ABNORMAL HIGH (ref 70–99)

## 2024-05-22 MED ORDER — OXYCODONE HCL 5 MG PO TABS
2.5000 mg | ORAL_TABLET | Freq: Three times a day (TID) | ORAL | Status: DC | PRN
  Administered 2024-05-23 – 2024-05-24 (×3): 2.5 mg via ORAL
  Filled 2024-05-22 (×3): qty 1

## 2024-05-22 MED ORDER — ACETAMINOPHEN 500 MG PO TABS
1000.0000 mg | ORAL_TABLET | Freq: Three times a day (TID) | ORAL | Status: DC
  Administered 2024-05-22 – 2024-05-28 (×18): 1000 mg via ORAL
  Filled 2024-05-22 (×18): qty 2

## 2024-05-22 NOTE — Progress Notes (Signed)
 Physical Therapy Session Note  Patient Details  Name: Beth Macdonald MRN: [id number] Date of Birth: Jan 24, 1946  Today's Date: 05/22/2024 PT Individual Time: 8584-8484 PT Individual Time Calculation (min): 60 min  and Today's Date: 05/22/2024 PT Missed Time: 15 Minutes Missed Time Reason: Patient fatigue  Short Term Goals: Week 1:  PT Short Term Goal 1 (Week 1): Pt will complete bed mobility with maxA +1. PT Short Term Goal 2 (Week 1): Pt will complete bed to chair transfer with maxA +1. PT Short Term Goal 3 (Week 1): Pt will complete static sitting balance for ~5 minutes with CGA.  Skilled Therapeutic Interventions/Progress Updates:      Pt in bed to start - notified by MD that patient's XR revealed pubic rami fx - now WBAT and order placed.   Patient in bed with her spouse at the bedside. Pt in agreement to therapy treatment. She reports R hip pain, unrated.   Pt assisted to EOB using chuck pad to help manage hips and reduce torsion/stress. Once positioned EOB with feet on ground, worked on sitting balance and sitting tolerance. Pt needing minA overall for static sitting, has posterior lean that can be corrected with cues. Pt somewhat perseverative on getting up but then with attempts, is in too much pain.   Used Stedy to assist her in standing from raised EOB height. Initially, only able to complete partial stands due to pain in her hip. Used a sheet behind her hips to help pull her forward more during the standing portion, which she responded well to. Able to transfer via Stedy with +2 assist to wheelchair.   Transported her to main gym and setup at [place] at w/c level. Attempted to engage her with AROM bilaterally with the lightest resistance but patient unable to complete? Multiple attempts made with different cueing.   Pt then instructed in ball taps with 3# dowel rod - able to complete 1x5 before onset of fatigue and patient requesting to return to her room. Unable to  redirect and patient visibly fatigued.   Returned to her room - reclined in TIS wheelchair. Needs met, spouse present. She missed the last 15 minutes of therapy due to fatigue.   Therapy Documentation Precautions:  Precautions Precautions: Fall, Other (comment) Recall of Precautions/Restrictions: Impaired Precaution/Restrictions Comments: L inattention Restrictions Weight Bearing Restrictions Per Provider Order: No General:      Therapy/Group: Individual Therapy  Sherlean SHAUNNA Perks 05/22/2024, 7:52 AM

## 2024-05-22 NOTE — Progress Notes (Addendum)
 PROGRESS NOTE   Subjective/Complaints:   Vitals stable.  Was complaining of some right groin and upper thigh pain this morning; extends to behind the right knee.  Per her wife, this has been going on since before her most recent urinary tract infection, no known associated trauma; did have a fall in the shower on 9-2 prior to being evaluated in the ER--had CT head and cervical spine at that time but no imaging of the extremities.   BG well controlled Eating well, 75-100% meals  ROS: Denies fevers, chills, N/V, abdominal pain, constipation, diarrhea, SOB, cough, chest pain, new weakness or paraesthesias.   Right thigh pain  Objective:   DG Chest 2 View Result Date: 05/21/2024 EXAM: 2 VIEW(S) XRAY OF THE CHEST 05/21/2024 03:41:00 PM COMPARISON: 05/19/2024 CLINICAL HISTORY: Aspiration into airway FINDINGS: LINES, TUBES AND DEVICES: Left chest cardiac loop recorder. LUNGS AND PLEURA: Improved lung volumes. No significant pleural effusion, interstitial edema or airspace consolidation. No pneumothorax visualized. HEART AND MEDIASTINUM: No acute abnormality of the cardiac and mediastinal silhouettes. BONES AND SOFT TISSUES: No acute osseous abnormality. IMPRESSION: 1. No acute cardiopulmonary process; no pleural effusion or pneumothorax. 2. Left chest cardiac loop recorder in place. Electronically signed by: Waddell Calk MD 05/21/2024 04:46 PM EDT RP Workstation: [id number]   Recent Labs    05/20/24 1844 05/21/24 0538  WBC 17.4* 13.5*  HGB 12.3 11.4*  HCT 37.8 35.0*  PLT 424* 378   Recent Labs    05/20/24 1844 05/21/24 0538  NA 148* 144  K 4.3 3.4*  CL 105 100  CO2 26 28  GLUCOSE 180* 162*  BUN 74* 64*  CREATININE 1.85* 1.48*  CALCIUM  9.2 8.7*    Intake/Output Summary (Last 24 hours) at 05/22/2024 0939 Last data filed at 05/22/2024 0745 Gross per 24 hour  Intake 838 ml  Output 1050 ml  Net -212 ml        Physical  Exam: Vital Signs Blood pressure 128/61, pulse 77, temperature 98 F (36.7 C), temperature source Oral, resp. rate 18, height 5' 8 (1.727 m), weight 74.1 kg, SpO2 98%.  Constitutional: No apparent distress. Appropriate appearance for age.  Reclining in bed. HENT: No JVD. Neck Supple. Trachea midline.  Eyes:  PERRLA. EOMI. Visual fields grossly intact.  Cardiovascular: RRR, no murmurs/rub/gallops.  1+ left upper extremity edema. Peripheral pulses 2+  Respiratory: CTAB. No rales, rhonchi, or wheezing. On RA.  Wet sounding intermittent cough. Abdomen: + bowel sounds, normoactive. No distention or tenderness.  Skin: C/D/I. No apparent lesions.  Peripheral IV intact. MSK:      No apparent deformity.      Right lower extremity tender to palpation along medial  hamstrings, and popliteal fossa.  No palpable deformity or effusion.  Increased pain with hip flexion.       Neurologic exam:  Cognition: AAO to person only; not place, time, or event.--Unchanged Language: Fluent, but hypophonic and with occasional perseveration. Memory: Severe deficits. Insight: Poor insight into current condition.  Mood: Pleasant affect, appropriate mood.  Sensation: To light touch intact in BL UEs and LEs  Reflexes: 2+ in BL UE and LEs. Negative Hoffman's and babinski signs bilaterally.  CN:  Mild left facial droop, mild left proptosis--chronic from childhood injury Coordination: L>R UE tremor at rest Spasticity: MAS 0 in all extremities.       Strength:                RUE: 5/5 SA, 5/5 EF, 5/5 EE, 5/5 WE, 5/5 FF, 5/5 FA                LUE:  3/5 SA, 4/5 EF, 4/5 EE, 4/5 WE, 4/5 FF, 4/5 FA                RLE: 1/5 HF, 3/5 KE, 4/5  DF, 4/5  EHL, 4/5  PF                 LLE:  1/5 HF, 1/5 KE, 4/5  DF, 4/5  EHL, 4/5  PF     Assessment/Plan: 1. Functional deficits which require 3+ hours per day of interdisciplinary therapy in a comprehensive inpatient rehab setting. Physiatrist is providing close team supervision and  24 hour management of active medical problems listed below. Physiatrist and rehab team continue to assess barriers to discharge/monitor patient progress toward functional and medical goals  Care Tool:  Bathing              Bathing assist Assist Level: 2 Helpers     Upper Body Dressing/Undressing Upper body dressing        Upper body assist Assist Level: Total Assistance - Patient < 25%    Lower Body Dressing/Undressing Lower body dressing      What is the patient wearing?: Pants, Incontinence brief     Lower body assist Assist for lower body dressing: 2 Helpers     Toileting Toileting    Toileting assist Assist for toileting: 2 Helpers     Transfers Chair/bed transfer  Transfers assist  Chair/bed transfer activity did not occur: Safety/medical concerns  Chair/bed transfer assist level: 2 Helpers     Locomotion Ambulation   Ambulation assist      Assist level: 2 helpers (+3 with WC follow) Assistive device:  (3 musketeers) Max distance: 4'   Walk 10 feet activity   Assist  Walk 10 feet activity did not occur: Safety/medical concerns        Walk 50 feet activity   Assist Walk 50 feet with 2 turns activity did not occur: Safety/medical concerns         Walk 150 feet activity   Assist Walk 150 feet activity did not occur: Safety/medical concerns         Walk 10 feet on uneven surface  activity   Assist Walk 10 feet on uneven surfaces activity did not occur: Safety/medical concerns         Wheelchair     Assist Is the patient using a wheelchair?: Yes      Wheelchair assist level: Dependent - Patient 0% Max wheelchair distance: 150'    Wheelchair 50 feet with 2 turns activity    Assist        Assist Level: Dependent - Patient 0%   Wheelchair 150 feet activity     Assist      Assist Level: Dependent - Patient 0%   Blood pressure 128/61, pulse 77, temperature 98 F (36.7 C), temperature source  Oral, resp. rate 18, height 5' 8 (1.727 m), weight 74.1 kg, SpO2 98%.  Medical Problem List and Plan: 1. Functional deficits secondary to encephalopathy             -  patient may shower             -ELOS/Goals: MaxA 10-14 days, then d/c to SNF, goal of CIR is to reduce burden of care             Stable to continue CIR  2.  Antithrombotics: -DVT/anticoagulation:  Pharmaceutical: Lovenox              -antiplatelet therapy: ASA/Plavis resumed 09/10.  3. Pain Management: tylenol  prn.    - 9/19: Right thigh pain with new findings of superior pubic rami fracture below; scheduled Tylenol  1000 mg 3 times daily, add as needed oxycodone  2.5 mg every 8 hours as needed for severe pain  4. Mood/Behavior/Sleep: LCSW to follow for evaluation and support.              -antipsychotic agents: Seroquel   - 9-18: Sleeping well, mood well-controlled.  Start sleep log.--Sleeping appropriately.  5. Neuropsych/cognition/mixed dementia: This patient is not capable of making decisions on her own behalf.  - Per patient's wife, managed by Dr. Rosemarie as outpatient  - On Aricept  10 mg nightly, resumed while in hospital; resume here 9-18  6. Skin/Wound Care: Routine pressure relief measures.  7. Fluids/Electrolytes/Nutrition: Strict I/O. Check CMET in am   - 9/18: Add K CL 40 meq daily x3 days  8. Aspiration PNA: Completed course of Rocephin . Started on Augmentin  09/17 for increase in Right base opacity. .             --WBC trending up 12.8-->15.2-->18.7-->20.4. Steroid effect?              --Recheck CXR in am -no consolidations or effusions   - 9/18: WBC downtrending 13.5; unfortunately did aspirate this a.m.  Discussed with pharmacy, should be covered by Augmentin , complete course.  9. ST elevation/ Acute sytolic CHF: Takotsubo v/s LAD disease. Strict I/O. Daily weights --Weight 167 at admission -->165 on 09/17.  Weight stable Filed Weights   05/20/24 1723 05/21/24 0535 05/22/24 0445  Weight: 72.6 kg 73.1 kg  74.1 kg    10. Acute renal failure: BUN/SCr on steady rise with IV lasix  started on 09/16             --Recheck labs in am and may need to back off of worsening- -improving 1.8->1.4 9-18; continue current regimen  11. Hypernatremia: Likely due to poor intake.  --Recheck BMET in am--resolved  12. T2DM: Hgb A1c-8.4. Monitor BS ac/hs and use SSI for elevated BS             --continue lantus  8 units BID-->may need to adjust as off tube feeds.    - -Relatively well-controlled on current regimen, trend Recent Labs    05/21/24 1623 05/21/24 2106 05/22/24 0555  GLUCAP 167* 172* 153*     13.  Dysphagia: continue D2, nectar liquids--strict enforcement and supervision   14. LUE edema: Question due to IV infiltration v/s DVT. Dopplers ordered   - Edema reduced, Dopplers pending--not yet performed 9-19, will repeat.  15. New onset seizures: continue Keppra .     - 9/19: Notable left upper extremity continuous tremor on exam; patient responsive, mild tremor in right upper extremity, per patient's wife this has been ongoing.  Continue Keppra  current dose.   18.  Right thigh pain.  Present since at least 9-8; reported 9-19. x-ray hip and knee ordered for today.  - X-ray concerning for occult fracture of right superior pubic rami - Discussed with Dr. Montine, appears stable, placed weightbearing as tolerated  for now, can consider CT if pain unable to control -orders placed  LOS: 2 days A FACE TO FACE EVALUATION WAS PERFORMED  Joesph JAYSON Likes 05/22/2024, 9:39 AM

## 2024-05-22 NOTE — Progress Notes (Signed)
 Occupational Therapy Session Note  Patient Details  Name: Beth Macdonald MRN: [id number] Date of Birth: May 11, 1946  Today's Date: 05/22/2024 OT Individual Time: 1000-1048 OT Individual Time Calculation (min): 48 min  and Today's Date: 05/22/2024 OT Missed Time: 12 Minutes Missed Time Reason: Patient fatigue;X-Ray   Short Term Goals: Week 1:  OT Short Term Goal 1 (Week 1): Pt able to sit EOB/EOM with min A statically in prep for ADL task OT Short Term Goal 2 (Week 1): Pt will don shirt with max A OT Short Term Goal 3 (Week 1): Pt will transfer to w/c out of bed with max A +1 OT Short Term Goal 4 (Week 1): Pt will be oriented x3 with min external aids OT Short Term Goal 5 (Week 1): Pt will use bilateral hands in grooming tasks with mod A  Skilled Therapeutic Interventions/Progress Updates:    Pt received supine with her wife [name] present. She had no c/o pain at rest. She was lethargic and often closing eyes when not interacting with OT. NT was in room attending to pt as she just pulled out her purewick. Rolled pt L and R with total A to don new brief. She reported immediate R hip pain with all movement, crying out and begging movement to stop. Notified DO Engler and x-ray ordered. Donned pants over BLE and then engaged pt in modified hip bridge for her to attempt and initiate pulling pants over her hips. She required total A but did participate and attempt to pull them up. She completed grooming tasks supine- washing face and completing oral care with min A. She had appropriate initiation. Discussed PLOF with her wife at length. RN reporting x-ray is on the way to get pt so declined getting out of bed. Final 12 min of session missed.   Therapy Documentation Precautions:  Precautions Precautions: Fall, Other (comment) Recall of Precautions/Restrictions: Impaired Precaution/Restrictions Comments: L inattention Restrictions Weight Bearing Restrictions Per Provider Order:  No  Therapy/Group: Individual Therapy  Nena VEAR Moats 05/22/2024, 8:32 AM

## 2024-05-22 NOTE — IPOC Note (Signed)
 Overall Plan of Care [facility]) Patient Details Name: Beth Macdonald MRN: [id number] DOB: 07-03-1946  Admitting Diagnosis: Seizure disorder as sequela of cerebrovascular accident [facility])  Hospital Problems: Principal Problem:   Seizure disorder as sequela of cerebrovascular accident [facility])     Functional Problem List: Nursing Bladder, Bowel, Edema, Endurance, Medication Management, Safety  PT Balance, Behavior, Endurance, Motor, Pain, Perception, Skin Integrity, Safety  OT Balance, Behavior, Cognition, Edema, Endurance, Motor, Nutrition, Pain, Perception, Safety, Skin Integrity, Vision  SLP Cognition, Linguistic, Nutrition, Safety  TR         Basic ADL's: OT Eating, Grooming, Bathing, Dressing, Toileting     Advanced  ADL's: OT None     Transfers: PT Bed Mobility, Bed to Chair, Car, Occupational psychologist, Research scientist (life sciences): PT Ambulation, Psychologist, prison and probation services, Stairs     Additional Impairments: OT Fuctional Use of Upper Extremity  SLP Swallowing, Social Cognition, Communication expression Problem Solving, Memory, Attention, Awareness  TR      Anticipated Outcomes Item Anticipated Outcome  Self Feeding min A  Swallowing  minA   Basic self-care  mod A  Toileting  max A   Bathroom Transfers mod A  Bowel/Bladder  manage bowels with prn medications/ mage bladder with toileting assistance  Transfers  ModA  Locomotion  maxA +2  Communication  modA  Cognition  maxA  Pain  <4 w/ prns  Safety/Judgment  manage safety with minimal assistance   Therapy Plan: PT Intensity: Minimum of 1-2 x/day ,45 to 90 minutes PT Frequency: 5 out of 7 days PT Duration Estimated Length of Stay: 10-14 Days OT Intensity: Minimum of 1-2 x/day, 45 to 90 minutes OT Frequency: 5 out of 7 days OT Duration/Estimated Length of Stay: ~ 2 weeks (~14 days) SLP Intensity: Minumum of 1-2 x/day, 30 to 90 minutes SLP Frequency: 3 to 5 out of 7 days SLP Duration/Estimated Length of Stay:  2 weeks   Team Interventions: Nursing Interventions Patient/Family Education, Bladder Management, Bowel Management, Disease Management/Prevention, Pain Management, Discharge Planning, Dysphagia/Aspiration Precaution Training, Cognitive Remediation/Compensation  PT interventions Ambulation/gait training, Community reintegration, DME/adaptive equipment instruction, Neuromuscular re-education, Psychosocial support, UE/LE Strength taining/ROM, Wheelchair propulsion/positioning, Warden/ranger, Discharge planning, Functional electrical stimulation, Pain management, Skin care/wound management, Therapeutic Activities, UE/LE Coordination activities, Cognitive remediation/compensation, Disease management/prevention, Patient/family education, Therapeutic Exercise, Visual/perceptual remediation/compensation, Functional mobility training  OT Interventions Balance/vestibular training, Disease mangement/prevention, Neuromuscular re-education, Self Care/advanced ADL retraining, Therapeutic Exercise, Wheelchair propulsion/positioning, Cognitive remediation/compensation, DME/adaptive equipment instruction, Skin care/wound managment, Pain management, UE/LE Strength taining/ROM, Community reintegration, Equities trader education, Splinting/orthotics, UE/LE Coordination activities, Discharge planning, Functional mobility training, Psychosocial support, Therapeutic Activities, Visual/perceptual remediation/compensation, Functional electrical stimulation  SLP Interventions Cognitive remediation/compensation, Cueing hierarchy, Functional tasks, Therapeutic Activities, Therapeutic Exercise, Dysphagia/aspiration precaution training, Patient/family education, Speech/Language facilitation, Multimodal communication approach  TR Interventions    SW/CM Interventions Discharge Planning, Psychosocial Support, Patient/Family Education   Barriers to Discharge MD  Medical stability, Home enviroment access/loayout, Incontinence,  Lack of/limited family support, Weight bearing restrictions, Medication compliance, Behavior, and Nutritional means  Nursing Decreased caregiver support, Home environment access/layout, Incontinence Discharge: House  Discharge Home Layout: One level  Discharge Home Access: Level entry  PT Behavior, Incontinence, Decreased caregiver support    OT Decreased caregiver support plans to d/c to SNF after rehab  SLP Other (comments) severity of deficits, pre morbid deficits  SW Decreased caregiver support, Lack of/limited family support     Team Discharge Planning: Destination: PT-Skilled Nursing Facility (SNF) ,OT- Home , SLP-Skilled Nursing  Facility (SNF) Projected Follow-up: PT-Skilled nursing facility, OT-  Skilled nursing facility, SLP-24 hour supervision/assistance, Skilled Nursing facility Projected Equipment Needs: PT-To be determined, OT- To be determined, SLP-None recommended by SLP Equipment Details: PT- , OT-  Patient/family involved in discharge planning: PT- Patient, Family member/caregiver,  OT-Patient, Family member/caregiver, SLP-Patient  MD ELOS: 10-14 days Medical Rehab Prognosis:  Fair Assessment: The patient has been admitted for CIR therapies with the diagnosis of encephalopathy. The team will be addressing functional mobility, strength, stamina, balance, safety, adaptive techniques and equipment, self-care, bowel and bladder mgt, patient and caregiver education,  . Goals have been set at Max assist x1 PT, OT; Max A SLP. Anticipated discharge destination is SNF.       See Team Conference Notes for weekly updates to the plan of care

## 2024-05-22 NOTE — Progress Notes (Signed)
 Speech Language Pathology Daily Session Note  Patient Details  Name: Beth Macdonald MRN: [id number] Date of Birth: [birth year]  Today's Date: 05/22/2024 SLP Individual Time: 9199-9143 SLP Individual Time Calculation (min): 56 min  Short Term Goals: Week 1: SLP Short Term Goal 1 (Week 1): Pt will tolerate trials of nectar thick liquids w/ no overt s/s of airway invasion 80% of the time or greater SLP Short Term Goal 2 (Week 1): Pt will tolerate trials of thin liquids w/ no overt s/s of airway invasion 60% of the time or greater SLP Short Term Goal 3 (Week 1): Pt will solve functional problems w/ maxA SLP Short Term Goal 4 (Week 1): Pt will complete functional naming tasks w/ maxA SLP Short Term Goal 5 (Week 1): Pt will utilize visual aids as needed to provide orientation information w/ maxA  Skilled Therapeutic Interventions:   Pt greeted at bedside for tx targeting cognition, language, and dysphagia. Similar to eval, she was initially nonverbal and required multiple questions/repetitions of questions to finally initiate verbal responses. SLP assisted w/ providing the pt honey thick orange juice/potassium. She presented w/ cough x2 during the final 2/10 trials. Anticipate flavor may have negatively impacted this as well. Cough remains very weak and (anticipate) minimally effective. SLP then facilitated brief dynamic language assessment: simple responsive naming 2/5, mildly specific responsive naming 0/5, and concrete generative naming 0/3. Orientation review was completed w/ calendar. Even with the use of the visual aid, she benefited from max-totalA to verbalize the correct information. She was DEP for oral care. Ideally, oral care would be completed via suction, however, pure wick remains in place and suction is unavailable. Toothettes utilized to absorb excess liquid. Of note, mild oral residue remains after meal. SLP added instructions to sign above her bed instructing staff to provide liquid  wash after solids to assist w/ clearance of oral residue. After oral care was complete, SLP facilitated ice chips. She presented w/ immediate (very weak) cough during 2/3 trials. Unable to continue w/ PO trials d/t pt fatigue. At the end of tx tasks, she was left in bed w/ the alarm set and call light within reach. Recommend cont ST per POC.    Pain  None reported  Therapy/Group: Individual Therapy  [name] 05/22/2024, 7:57 AM

## 2024-05-23 ENCOUNTER — Encounter (HOSPITAL_COMMUNITY): Payer: MEDICARE

## 2024-05-23 ENCOUNTER — Inpatient Hospital Stay (HOSPITAL_COMMUNITY): Payer: MEDICARE

## 2024-05-23 DIAGNOSIS — M25559 Pain in unspecified hip: Secondary | ICD-10-CM | POA: Diagnosis not present

## 2024-05-23 DIAGNOSIS — G40909 Epilepsy, unspecified, not intractable, without status epilepticus: Secondary | ICD-10-CM | POA: Diagnosis not present

## 2024-05-23 DIAGNOSIS — Z794 Long term (current) use of insulin: Secondary | ICD-10-CM

## 2024-05-23 DIAGNOSIS — K5900 Constipation, unspecified: Secondary | ICD-10-CM | POA: Diagnosis not present

## 2024-05-23 DIAGNOSIS — I69398 Other sequelae of cerebral infarction: Secondary | ICD-10-CM | POA: Diagnosis not present

## 2024-05-23 DIAGNOSIS — E119 Type 2 diabetes mellitus without complications: Secondary | ICD-10-CM | POA: Insufficient documentation

## 2024-05-23 LAB — GLUCOSE, CAPILLARY
Glucose-Capillary: 117 mg/dL — ABNORMAL HIGH (ref 70–99)
Glucose-Capillary: 184 mg/dL — ABNORMAL HIGH (ref 70–99)
Glucose-Capillary: 169 mg/dL — ABNORMAL HIGH (ref 70–99)
Glucose-Capillary: 153 mg/dL — ABNORMAL HIGH (ref 70–99)

## 2024-05-23 MED ORDER — POLYETHYLENE GLYCOL 3350 17 G PO PACK
34.0000 g | PACK | Freq: Once | ORAL | Status: DC
Start: 2024-05-23 — End: 2024-05-23

## 2024-05-23 MED ORDER — POLYETHYLENE GLYCOL 3350 17 G PO PACK: 17.0000 g | PACK | Freq: Every day | ORAL | Status: DC

## 2024-05-23 MED ORDER — POLYETHYLENE GLYCOL 3350 17 G PO PACK
34.0000 g | PACK | Freq: Every day | ORAL | Status: DC
  Administered 2024-05-23 – 2024-05-28 (×5): 34 g via ORAL
  Filled 2024-05-23 (×6): qty 2

## 2024-05-23 NOTE — Progress Notes (Signed)
 Speech Language Pathology Daily Session Note  Patient Details  Name: Beth Macdonald MRN: [id number] Date of Birth: 1946-01-07  Today's Date: 05/23/2024 SLP Individual Time: 1345-1445 SLP Individual Time Calculation (min): 60 min  Short Term Goals: Week 1: SLP Short Term Goal 1 (Week 1): Pt will tolerate trials of nectar thick liquids w/ no overt s/s of airway invasion 80% of the time or greater SLP Short Term Goal 2 (Week 1): Pt will tolerate trials of thin liquids w/ no overt s/s of airway invasion 60% of the time or greater SLP Short Term Goal 3 (Week 1): Pt will solve functional problems w/ maxA SLP Short Term Goal 4 (Week 1): Pt will complete functional naming tasks w/ maxA SLP Short Term Goal 5 (Week 1): Pt will utilize visual aids as needed to provide orientation information w/ maxA  Skilled Therapeutic Interventions: Skilled therapy session focused on dysphagia, cognitive and communicative goals. SLP facilitated session by prompting completion of oral care via suction toothbrush. SLP observed consumption of ice chips and thin liquids via tsp. Patient with only x1 throat clear across all trials. SLP then observed consumption of D1 textures from lunch tray per patient request. Patient with timely swallow initiation and complete oral clearance. No s/sx of aspiration with solids. Continue current diet and full supervision.  SLP targeted cognition through orientation task. Patient oriented to name, however unable to recall age, location, situation nor time. SLP provided handout and encouraged patient to utilize with maxA to answer orientation questions. SLP reviewed use of call bell and provided total A for patient to press correct button. SLP provided handout to aid in recall. Lastly, SLP targeted communication goals through confrontational naming of functional objects. Patient named 1/6 items correctly and required max-total A to name remainder. Patinet with consistent use of semantic  paraphasias. Patient left in [facility] with alarm set and visitor present. Continue POC  Pain None reported   Therapy/Group: Individual Therapy  [name] M.A., CCC-SLP 05/23/2024, 8:00 AM

## 2024-05-23 NOTE — Progress Notes (Signed)
 Occupational Therapy Session Note  Patient Details  Name: Beth Macdonald MRN: [id number] Date of Birth: 02/17/1946  Today's Date: 05/23/2024 OT Individual Time: 9154-9054 OT Individual Time Calculation (min): 60 min    Short Term Goals: Week 1:  OT Short Term Goal 1 (Week 1): Pt able to sit EOB/EOM with min A statically in prep for ADL task OT Short Term Goal 2 (Week 1): Pt will don shirt with max A OT Short Term Goal 3 (Week 1): Pt will transfer to w/c out of bed with max A +1 OT Short Term Goal 4 (Week 1): Pt will be oriented x3 with min external aids OT Short Term Goal 5 (Week 1): Pt will use bilateral hands in grooming tasks with mod A  Skilled Therapeutic Interventions/Progress Updates:    Patietn in bed at the time of arrival listening to the TV. The pt reported not sleeping well during the night with no pain,or dizziness to report. The pt was able to articulate her ability to comprehend what was being said. The pt was able to come from supine in bed to EOB with MaxAx1 using the pad. The pt was assisted with  maintain upright positioning sitting EOB at  MaxA incorporating the bed and the bed rail with her feet supported for an extended period to improve sit balance. The pt was able to come into stand using the stedyx2 for transferfing to the w/c at MaxAx2. The pt was total A for donning her brief and pants. The pt was  able to wash her face and brush her teeth with s/uA. The pt was able to complete UB exercises using  the 1lb dowel 1 set of 10 for shld flexion with rest breaks as needed, the pt required 5  rest breaks. The pt went on to complete a standing activity using the stedy for coming into stand 3x  at MaxA  with 4 rest breaks. At the end of the session, the pt remained at w/c  LOF with the chair positioned in recline  and all additional needs addressed with  chair alarm activated.  Therapy Documentation Precautions:  Precautions Precautions: Fall, Other (comment) Recall of  Precautions/Restrictions: Impaired Precaution/Restrictions Comments: L inattention Restrictions Weight Bearing Restrictions Per Provider Order: No  Therapy/Group: Individual Therapy  [name] 05/23/2024, 12:46 PM

## 2024-05-23 NOTE — Progress Notes (Addendum)
 Physical Therapy Session Note  Patient Details  Name: Beth Macdonald MRN: [id number] Date of Birth: 1946-01-06  Today's Date: 05/23/2024 PT Individual Time: 1100-1200 PT Individual Time Calculation (min): 60 min   Short Term Goals: Week 1:  PT Short Term Goal 1 (Week 1): Pt will complete bed mobility with maxA +1. PT Short Term Goal 2 (Week 1): Pt will complete bed to chair transfer with maxA +1. PT Short Term Goal 3 (Week 1): Pt will complete static sitting balance for ~5 minutes with CGA.  Skilled Therapeutic Interventions/Progress Updates: Pt presents sitting in TIS, reclined back.  Pt wheeled to main gym after agreeing to therapy.  Pt c/o pain to pelvic area but unable to rate.  Rx modified and rest breaks given for pain management.  Pt wheeled to // bars.  Pt transfers sit to stand w/ max A multiple trials although does not come to upright posture, c/o pain and occasionally resting head on PT chest, sometimes cursing.  Pt unable to remain standing > 30 secs.  Cued for use of UES for decreased WB to LES but unable to correct.  Shirt doffed and new scrub top donned w/ max A.  Pt performed calf raises x 8 but then increaseing pain and cursing.  Pt performed LAQ 2 x 10, cues for increased ROM but minmal repetition and then increased c/o pain.  Pt assisted scooting back into TIS and leg rests extended for seated position.  Padded leg rest sides for comfort.  Pt returned to room and remained sitting in TIS, reclined back w/ chair alarm on and all needs in reach.  Missed time of 65' 2/2 overrun on previous pt treatment and then unwilling to participate to complete.  Ice placed to pelvis and pain meds requested from nursing.     Therapy Documentation Precautions:  Precautions Precautions: Fall, Other (comment) Recall of Precautions/Restrictions: Impaired Precaution/Restrictions Comments: L inattention Restrictions Weight Bearing Restrictions Per Provider Order: No General: PT Amount of  Missed Time (min): 15 Minutes PT Missed Treatment Reason: Other (Comment) (late w/ prior pt.) Vital Signs:   Pain:pt unable to rate. Pain Assessment Pain Scale: 0-10 Pain Score: 10-Worst pain ever Pain Location: Hip Pain Intervention(s): Medication (See eMAR);Repositioned    Therapy/Group: Individual Therapy  Vietta Bonifield P Astha Probasco 05/23/2024, 1:03 PM

## 2024-05-23 NOTE — Progress Notes (Addendum)
 PROGRESS NOTE   Subjective/Complaints: Patient sitting in wheelchair.  Wife reports her appetite is doing better today.  She reports it was decreased for a long time when she was taking Ozempic  previously.   ROS: Denies fevers, chills, N/V,  constipation, diarrhea, SOB, cough, chest pain, new weakness or paraesthesias.   Patient reports pain, when asked where it is she indicates its everywhere.  Chart review she has been indicating continued pain in her hip.  Objective:   DG Knee 1-2 Views Right Result Date: 05/22/2024 EXAM: 1 or 2 VIEW(S) XRAY OF THE RIGHT KNEE 05/22/2024 12:11:00 PM COMPARISON: None available. CLINICAL HISTORY: Pain in right knee. Acute right hip pain. FINDINGS: BONES AND JOINTS: Right knee arthroplasty noted. No periprosthetic lucency. No acute or periprosthetic fracture. Trace joint effusion. SOFT TISSUES: The soft tissues are unremarkable. IMPRESSION: 1. Right knee arthroplasty without complication. 2. Trace joint effusion. Electronically signed by: Andrea Gasman MD 05/22/2024 02:43 PM EDT RP Workstation: [id number]   DG HIP UNILAT WITH PELVIS 2-3 VIEWS RIGHT Result Date: 05/22/2024 EXAM: 3 VIEW(S) XRAY OF THE UNILATERAL HIP 05/22/2024 12:11:00 PM COMPARISON: None available. CLINICAL HISTORY: Acute hip pain, right [id number]. Acute right hip pain FINDINGS: BONES AND JOINTS: Subtle periosteal reaction along the medial aspect of the right superior pubic ramus possibly indicating occult fracture. Degenerative changes of the lower lumbar spine. SOFT TISSUES: Residual enteric contrast within the colon. IMPRESSION: 1. Subtle periosteal reaction along the medial right superior pubic ramus, suspicious for occult fracture; correlate with point tenderness and site of pain. Electronically signed by: Ryan Chess MD 05/22/2024 12:44 PM EDT RP Workstation: [id number]   DG Chest 2 View Result Date: 05/21/2024 EXAM: 2 VIEW(S) XRAY OF  THE CHEST 05/21/2024 03:41:00 PM COMPARISON: 05/19/2024 CLINICAL HISTORY: Aspiration into airway FINDINGS: LINES, TUBES AND DEVICES: Left chest cardiac loop recorder. LUNGS AND PLEURA: Improved lung volumes. No significant pleural effusion, interstitial edema or airspace consolidation. No pneumothorax visualized. HEART AND MEDIASTINUM: No acute abnormality of the cardiac and mediastinal silhouettes. BONES AND SOFT TISSUES: No acute osseous abnormality. IMPRESSION: 1. No acute cardiopulmonary process; no pleural effusion or pneumothorax. 2. Left chest cardiac loop recorder in place. Electronically signed by: Waddell Calk MD 05/21/2024 04:46 PM EDT RP Workstation: [id number]   Recent Labs    05/20/24 1844 05/21/24 0538  WBC 17.4* 13.5*  HGB 12.3 11.4*  HCT 37.8 35.0*  PLT 424* 378   Recent Labs    05/20/24 1844 05/21/24 0538  NA 148* 144  K 4.3 3.4*  CL 105 100  CO2 26 28  GLUCOSE 180* 162*  BUN 74* 64*  CREATININE 1.85* 1.48*  CALCIUM  9.2 8.7*    Intake/Output Summary (Last 24 hours) at 05/23/2024 0946 Last data filed at 05/23/2024 0857 Gross per 24 hour  Intake 609 ml  Output 500 ml  Net 109 ml        Physical Exam: Vital Signs Blood pressure (!) 115/56, pulse 73, temperature 98.6 F (37 C), resp. rate 17, height 5' 8 (1.727 m), weight 71.7 kg, SpO2 98%.  Constitutional: No apparent distress. Appropriate appearance for age.  Sitting in wheelchair.  Drinking fluids HENT: No JVD. Neck Supple. Trachea midline.  Eyes:  PERRLA. EOMI. Visual fields grossly intact.  Cardiovascular: RRR, no murmurs/rub/gallops.  1+ left upper extremity edema. Peripheral pulses 2+  Respiratory: CTAB. No rales, rhonchi, or wheezing. On RA.   Abdomen: + bowel sounds, normoactive. No distention, minimally tender, no guarding Skin: C/D/I. No apparent lesions.  Peripheral IV intact. MSK:      No apparent deformity.      Right lower extremity tender to palpation along medial  hamstrings, and  popliteal fossa.  No palpable deformity or effusion.  Increased pain with hip flexion. Patient indicates mild discomfort everywhere palpated in her arms or legs, torso       Neurologic exam: Alert and oriented to person only, she says I do not know to other questions.  She was able to name her wife and other visitor by name.  Fluent, memory deficits noted.  Sensation intact to light touch in all 4 extremities.  Mild left facial weakness.  Motor and sensory function consistent with prior.   Prior Exam   Cognition: AAO to person only; not place, time, or event.--Unchanged Language: Fluent, but hypophonic and with occasional perseveration. Memory: Severe deficits. Insight: Poor insight into current condition.  Mood: Pleasant affect, appropriate mood.  Sensation: To light touch intact in BL UEs and LEs  Reflexes: 2+ in BL UE and LEs. Negative Hoffman's and babinski signs bilaterally.  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       Strength:                RUE: 5/5 SA, 5/5 EF, 5/5 EE, 5/5 WE, 5/5 FF, 5/5 FA                LUE:  3/5 SA, 4/5 EF, 4/5 EE, 4/5 WE, 4/5 FF, 4/5 FA                RLE: 1/5 HF, 3/5 KE, 4/5  DF, 4/5  EHL, 4/5  PF                 LLE:  1/5 HF, 1/5 KE, 4/5  DF, 4/5  EHL, 4/5  PF   Prior neuro assessment is c/w today's exam 05/23/2024.     Assessment/Plan: 1. Functional deficits which require 3+ hours per day of interdisciplinary therapy in a comprehensive inpatient rehab setting. Physiatrist is providing close team supervision and 24 hour management of active medical problems listed below. Physiatrist and rehab team continue to assess barriers to discharge/monitor patient progress toward functional and medical goals  Care Tool:  Bathing              Bathing assist Assist Level: 2 Helpers     Upper Body Dressing/Undressing Upper body dressing         Upper body assist Assist Level: Total Assistance - Patient < 25%    Lower Body Dressing/Undressing Lower body dressing      What is the patient wearing?: Pants, Incontinence brief     Lower body assist Assist for lower body dressing: 2 Helpers     Toileting Toileting    Toileting assist Assist for toileting: 2 Helpers     Transfers Chair/bed transfer  Transfers assist  Chair/bed transfer activity did not occur: Safety/medical concerns  Chair/bed transfer assist level: 2 Helpers     Locomotion Ambulation   Ambulation assist      Assist level: 2 helpers (+3 with  WC follow) Assistive device:  (3 musketeers) Max distance: 4'   Walk 10 feet activity   Assist  Walk 10 feet activity did not occur: Safety/medical concerns        Walk 50 feet activity   Assist Walk 50 feet with 2 turns activity did not occur: Safety/medical concerns         Walk 150 feet activity   Assist Walk 150 feet activity did not occur: Safety/medical concerns         Walk 10 feet on uneven surface  activity   Assist Walk 10 feet on uneven surfaces activity did not occur: Safety/medical concerns         Wheelchair     Assist Is the patient using a wheelchair?: Yes      Wheelchair assist level: Dependent - Patient 0% Max wheelchair distance: 150'    Wheelchair 50 feet with 2 turns activity    Assist        Assist Level: Dependent - Patient 0%   Wheelchair 150 feet activity     Assist      Assist Level: Dependent - Patient 0%   Blood pressure (!) 115/56, pulse 73, temperature 98.6 F (37 C), resp. rate 17, height 5' 8 (1.727 m), weight 71.7 kg, SpO2 98%.  Medical Problem List and Plan: 1. 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   Antithrombotics: -DVT/anticoagulation:  Pharmaceutical: Lovenox              -antiplatelet therapy: ASA/Plavis resumed 09/10.  3. Pain Management: tylenol  prn.    - 9/19: Right thigh pain with new findings of superior pubic rami fracture below; scheduled Tylenol  1000 mg 3 times daily, add as needed oxycodone  2.5 mg every 8 hours as needed for severe pain  - 9/20 inconsistent reports of pain, may be associated with cognition deficits.  Does not look uncomfortable and was noted to be sleeping earlier  4. Mood/Behavior/Sleep: LCSW to follow for evaluation and support.              -antipsychotic agents: Seroquel   - 9-18: Sleeping well, mood well-controlled.  Start sleep log.--Sleeping appropriately.  5. Neuropsych/cognition/mixed dementia: This patient is not capable of making decisions on her own behalf.  - Per patient's wife, managed by Dr. Rosemarie as outpatient  - On Aricept  10 mg nightly, resumed while in hospital; resume here 9-18  6. Skin/Wound Care: Routine pressure relief measures.  7. Fluids/Electrolytes/Nutrition: Strict I/O. Check CMET in am   - 9/18: Add K CL 40 meq daily x3 days  8. Aspiration PNA: Completed course of Rocephin . Started on Augmentin  09/17 for increase in Right base opacity. .             --WBC trending up 12.8-->15.2-->18.7-->20.4. Steroid effect?              --Recheck CXR in am -no consolidations or effusions   - 9/18: WBC downtrending 13.5; unfortunately did aspirate this a.m.  Discussed with pharmacy, should be covered by Augmentin , complete course.  9. ST elevation/ Acute sytolic CHF: Takotsubo v/s LAD disease. Strict I/O. Daily weights --Weight 167 at admission -->165 on 09/17.  Weight stable 9/20 weight a little lower no signs of fluid overload noted Filed Weights   05/21/24 0535 05/22/24 0445 05/23/24 0500  Weight: 73.1 kg 74.1 kg 71.7 kg    10. Acute renal failure: BUN/SCr on steady rise with IV lasix  started on 09/16             --Recheck labs in am  and may need to back off of worsening- -improving 1.8->1.4 9-18; continue current regimen  11. Hypernatremia: Likely due to poor intake.  --Recheck BMET in am--resolved  12. T2DM: Hgb A1c-8.4. Monitor BS ac/hs and use SSI for elevated BS             --continue lantus  8 units BID-->may need to adjust as off tube feeds.    - -Relatively well-controlled on current regimen, trend  - 9/20 fair control continue current regimen and monitor Recent Labs    05/22/24 1652 05/22/24 2126 05/23/24 0609  GLUCAP 199* 171* 117*     13.  Dysphagia: continue D2, nectar liquids--strict enforcement and supervision   14. LUE edema: Question due to IV infiltration v/s DVT. Dopplers ordered   - Edema reduced, Dopplers pending--not yet performed 9-19, will repeat.  15. New onset seizures: continue Keppra .     - 9/19: Notable left upper extremity continuous tremor on exam; patient responsive, mild tremor in right upper extremity, per patient's wife this has been ongoing.  Continue Keppra  current dose.   18.  Right thigh pain.  Present since at least 9-8; reported 9-19. x-ray hip and knee ordered for today.  - X-ray concerning for occult fracture of right superior pubic rami - Discussed with Dr. Montine, appears stable, placed weightbearing as tolerated for now, can consider CT if pain unable to control -orders placed  19.  Constipation.  LBM 9/16 recorded.  - X-ray abdomen to check stool burden, will order 34 g of MiraLAX - thickend  LOS: 3 days A FACE TO FACE EVALUATION WAS PERFORMED  Murray Collier 05/23/2024, 9:46 AM

## 2024-05-24 ENCOUNTER — Inpatient Hospital Stay (HOSPITAL_COMMUNITY): Payer: MEDICARE

## 2024-05-24 DIAGNOSIS — I69398 Other sequelae of cerebral infarction: Secondary | ICD-10-CM | POA: Diagnosis not present

## 2024-05-24 DIAGNOSIS — R300 Dysuria: Secondary | ICD-10-CM

## 2024-05-24 DIAGNOSIS — K5900 Constipation, unspecified: Secondary | ICD-10-CM | POA: Diagnosis not present

## 2024-05-24 DIAGNOSIS — R609 Edema, unspecified: Secondary | ICD-10-CM | POA: Diagnosis not present

## 2024-05-24 DIAGNOSIS — I5021 Acute systolic (congestive) heart failure: Secondary | ICD-10-CM

## 2024-05-24 DIAGNOSIS — E119 Type 2 diabetes mellitus without complications: Secondary | ICD-10-CM | POA: Diagnosis not present

## 2024-05-24 LAB — GLUCOSE, CAPILLARY
Glucose-Capillary: 97 mg/dL (ref 70–99)
Glucose-Capillary: 184 mg/dL — ABNORMAL HIGH (ref 70–99)
Glucose-Capillary: 172 mg/dL — ABNORMAL HIGH (ref 70–99)
Glucose-Capillary: 144 mg/dL — ABNORMAL HIGH (ref 70–99)

## 2024-05-24 LAB — URINALYSIS, W/ REFLEX TO CULTURE (INFECTION SUSPECTED)
Bacteria, UA: NONE SEEN
Bilirubin Urine: NEGATIVE
Glucose, UA: NEGATIVE mg/dL
Hgb urine dipstick: NEGATIVE
Ketones, ur: NEGATIVE mg/dL
Leukocytes,Ua: NEGATIVE
Nitrite: NEGATIVE
Protein, ur: NEGATIVE mg/dL
Specific Gravity, Urine: 1.012 (ref 1.005–1.030)
pH: 6 (ref 5.0–8.0)

## 2024-05-24 LAB — CULTURE, BLOOD (ROUTINE X 2)
Culture: NO GROWTH
Culture: NO GROWTH
Special Requests: ADEQUATE
Special Requests: ADEQUATE

## 2024-05-24 MED ORDER — FUROSEMIDE 10 MG/ML IJ SOLN
40.0000 mg | Freq: Every day | INTRAMUSCULAR | Status: DC
  Administered 2024-05-25 – 2024-05-26 (×2): 40 mg via INTRAVENOUS
  Filled 2024-05-24 (×2): qty 4

## 2024-05-24 NOTE — Progress Notes (Signed)
 Left upper extremity venous duplex has been completed.  Results can be found in chart review under CV Proc.  05/24/2024 12:03 PM  Kindsey Eblin [employer], RVT.

## 2024-05-24 NOTE — Progress Notes (Signed)
 PROGRESS NOTE   Subjective/Complaints: Pt reporting pain when she urinates. Reports its hard to empty her bladder.  LBM yesterday  ROS: Denies fevers, chills, N/V,  constipation, diarrhea, SOB, cough, chest pain, new weakness or paraesthesias.   Dysuria  Objective:   VAS US  UPPER EXTREMITY VENOUS DUPLEX Result Date: 05/24/2024 UPPER VENOUS STUDY  Patient Name:  Beth Macdonald  Date of Exam:   05/24/2024 Medical Rec #: [id number]          Accession #:    [id number] Date of Birth: 02/27/1946          Patient Gender: F Patient Age:   77 years Exam Location:  [facility] Procedure:      VAS US  UPPER EXTREMITY VENOUS DUPLEX Referring Phys: JOESPH LIKES --------------------------------------------------------------------------------  Indications: Edema, and stroke Comparison Study: No prior exam. Performing Technologist: Edilia Elden Appl  Examination Guidelines: A complete evaluation includes B-mode imaging, spectral Doppler, color Doppler, and power Doppler as needed of all accessible portions of each vessel. Bilateral testing is considered an integral part of a complete examination. Limited examinations for reoccurring indications may be performed as noted.  Right Findings: +----------+------------+---------+-----------+----------+-------+ RIGHT     CompressiblePhasicitySpontaneousPropertiesSummary +----------+------------+---------+-----------+----------+-------+ IJV           Full       Yes       Yes                      +----------+------------+---------+-----------+----------+-------+ Subclavian               Yes       Yes                      +----------+------------+---------+-----------+----------+-------+  Left Findings: +----------+------------+---------+-----------+----------+-------+ LEFT      CompressiblePhasicitySpontaneousPropertiesSummary  +----------+------------+---------+-----------+----------+-------+ IJV           Full       Yes       Yes                      +----------+------------+---------+-----------+----------+-------+ Subclavian               Yes       Yes                      +----------+------------+---------+-----------+----------+-------+ Axillary      Full       Yes       Yes                      +----------+------------+---------+-----------+----------+-------+ Brachial      Full       Yes       Yes                      +----------+------------+---------+-----------+----------+-------+ Radial        Full                                          +----------+------------+---------+-----------+----------+-------+ Ulnar  Full                                          +----------+------------+---------+-----------+----------+-------+ Cephalic      None       No        No                       +----------+------------+---------+-----------+----------+-------+ Basilic       Full       Yes       Yes                      +----------+------------+---------+-----------+----------+-------+ Evidence of acute to age indeterminate superficial vein thrombosis noted in the left cephalic vein from the antecubital fossa to the clavicular area, just distal to the without infiltrating the subclavian vein.  Summary:  Right: No evidence of thrombosis in the subclavian.  Left: No evidence of deep vein thrombosis in the upper extremity. Findings consistent with acute superficial vein thrombosis involving the left cephalic vein. Findings consistent with age indeterminate superficial vein thrombosis involving the left cephalic vein.  *See table(s) above for measurements and observations.  Diagnosing physician: Penne Colorado MD Electronically signed by Penne Colorado MD on 05/24/2024 at 12:03:13 PM.    Final    DG Abd 2 Views Result Date: 05/23/2024 EXAM: 2 VIEW XRAY OF THE ABDOMEN 05/23/2024 07:02:11  PM COMPARISON: None available. CLINICAL HISTORY: Constipation FINDINGS: BOWEL: Normal colonic stool burden. Contrast extending from the cecum to the rectum. Nonobstructive bowel gas pattern. SOFT TISSUES: No opaque urinary calculi. BONES: No acute osseous abnormality. IMPRESSION: 1. Negative. Electronically signed by: Pinkie Pebbles MD 05/23/2024 07:10 PM EDT RP Workstation: [id number]   DG Knee 1-2 Views Right Result Date: 05/22/2024 EXAM: 1 or 2 VIEW(S) XRAY OF THE RIGHT KNEE 05/22/2024 12:11:00 PM COMPARISON: None available. CLINICAL HISTORY: Pain in right knee. Acute right hip pain. FINDINGS: BONES AND JOINTS: Right knee arthroplasty noted. No periprosthetic lucency. No acute or periprosthetic fracture. Trace joint effusion. SOFT TISSUES: The soft tissues are unremarkable. IMPRESSION: 1. Right knee arthroplasty without complication. 2. Trace joint effusion. Electronically signed by: Andrea Gasman MD 05/22/2024 02:43 PM EDT RP Workstation: [id number]   DG HIP UNILAT WITH PELVIS 2-3 VIEWS RIGHT Result Date: 05/22/2024 EXAM: 3 VIEW(S) XRAY OF THE UNILATERAL HIP 05/22/2024 12:11:00 PM COMPARISON: None available. CLINICAL HISTORY: Acute hip pain, right [id number]. Acute right hip pain FINDINGS: BONES AND JOINTS: Subtle periosteal reaction along the medial aspect of the right superior pubic ramus possibly indicating occult fracture. Degenerative changes of the lower lumbar spine. SOFT TISSUES: Residual enteric contrast within the colon. IMPRESSION: 1. Subtle periosteal reaction along the medial right superior pubic ramus, suspicious for occult fracture; correlate with point tenderness and site of pain. Electronically signed by: Ryan Chess MD 05/22/2024 12:44 PM EDT RP Workstation: [id number]   No results for input(s): WBC, HGB, HCT, PLT in the last 72 hours.  No results for input(s): NA, K, CL, CO2, GLUCOSE, BUN, CREATININE, CALCIUM  in the last 72 hours.   Intake/Output Summary  (Last 24 hours) at 05/24/2024 1208 Last data filed at 05/24/2024 1014 Gross per 24 hour  Intake 924 ml  Output --  Net 924 ml        Physical Exam: Vital Signs Blood pressure (!) 120/42, pulse 71, temperature 98 F (36.7 C), resp. rate 17,  height 5' 8 (1.727 m), weight 67.6 kg, SpO2 98%.  Constitutional: No apparent distress. Appropriate appearance for age.  Sitting in wheelchair.  Drinking fluids HENT: No JVD. Neck Supple. Trachea midline.  Eyes:  PERRLA. EOMI. Visual fields grossly intact.  Cardiovascular: RRR, no murmurs/rub/gallops.  1+ left upper extremity edema. Peripheral pulses 2+  Respiratory: CTAB. No rales, rhonchi, or wheezing. On RA.   Abdomen: + bowel sounds, normoactive. No distention Skin: C/D/I. No apparent lesions.  Peripheral IV intact. MSK:      No apparent deformity.      Right lower extremity tender to palpation along medial  hamstrings, and popliteal fossa.  No palpable deformity or effusion.  Increased pain with hip flexion. Patient indicates mild discomfort everywhere palpated in her arms or legs, torso       Neurologic exam: Alert and oriented to person only, she says I do not know to other questions.  She was able to name her wife and other visitor by name.  Fluent, memory deficits noted.  Sensation intact to light touch in all 4 extremities.  Mild left facial weakness.  Motor and sensory function consistent with prior.   Prior Exam   Cognition: AAO to person only; not place, time, or event.--Unchanged Language: Fluent, but hypophonic and with occasional perseveration. Memory: Severe deficits. Insight: Poor insight into current condition.  Mood: Pleasant affect, appropriate mood.  Sensation: To light touch intact in BL UEs and LEs  Reflexes: 2+ in BL UE and LEs. Negative Hoffman's and babinski signs bilaterally.  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       Strength:                RUE: 5/5 SA, 5/5 EF, 5/5 EE, 5/5 WE, 5/5 FF, 5/5 FA                LUE:  3/5 SA, 4/5 EF, 4/5 EE, 4/5 WE, 4/5 FF, 4/5 FA                RLE: 1/5 HF, 3/5 KE, 4/5  DF, 4/5  EHL, 4/5  PF                 LLE:  1/5 HF, 1/5 KE, 4/5  DF, 4/5  EHL, 4/5  PF   Prior neuro assessment is c/w today's exam 05/24/2024.     Assessment/Plan: 1. Functional deficits which require 3+ hours per day of interdisciplinary therapy in a comprehensive inpatient rehab setting. Physiatrist is providing close team supervision and 24 hour management of active medical problems listed below. Physiatrist and rehab team continue to assess barriers to discharge/monitor patient progress toward functional and medical goals  Care Tool:  Bathing              Bathing assist Assist Level: 2 Helpers     Upper Body Dressing/Undressing Upper body dressing        Upper body assist Assist Level: Total Assistance - Patient < 25%    Lower Body Dressing/Undressing Lower body dressing      What is the patient wearing?: Pants, Incontinence brief     Lower body assist Assist for lower body dressing: 2 Helpers     Toileting Toileting    Toileting assist Assist for toileting: 2 Helpers     Transfers Chair/bed transfer  Transfers assist  Chair/bed transfer activity did not occur: Safety/medical  concerns  Chair/bed transfer assist level: 2 Helpers     Locomotion Ambulation   Ambulation assist      Assist level: 2 helpers (+3 with WC follow) Assistive device:  (3 musketeers) Max distance: 4'   Walk 10 feet activity   Assist  Walk 10 feet activity did not occur: Safety/medical concerns        Walk 50 feet activity   Assist Walk 50 feet with 2 turns activity did not occur: Safety/medical concerns         Walk 150 feet activity   Assist Walk 150 feet activity did not occur: Safety/medical concerns         Walk 10 feet on uneven surface   activity   Assist Walk 10 feet on uneven surfaces activity did not occur: Safety/medical concerns         Wheelchair     Assist Is the patient using a wheelchair?: Yes      Wheelchair assist level: Dependent - Patient 0% Max wheelchair distance: 150'    Wheelchair 50 feet with 2 turns activity    Assist        Assist Level: Dependent - Patient 0%   Wheelchair 150 feet activity     Assist      Assist Level: Dependent - Patient 0%   Blood pressure (!) 120/42, pulse 71, temperature 98 F (36.7 C), resp. rate 17, height 5' 8 (1.727 m), weight 67.6 kg, SpO2 98%.  Medical Problem List and Plan: 1. 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  Antithrombotics: -DVT/anticoagulation:  Pharmaceutical: Lovenox              -antiplatelet therapy: ASA/Plavis resumed 09/10.  3. Pain Management: tylenol  prn.    - 9/19: Right thigh pain with new findings of superior pubic rami fracture below; scheduled Tylenol  1000 mg 3 times daily, add as needed oxycodone  2.5 mg every 8 hours as needed for severe pain  - 9/20 inconsistent reports of pain, may be associated with cognition deficits.  Does not look uncomfortable and was noted to be sleeping earlier  - 9/21, patient indicates discomfort with palpation throughout her body, throughout arms and legs, clavicles, and abdomen, head.  Nursing documented 0 10 earlier this morning.  4. Mood/Behavior/Sleep: LCSW to follow for evaluation and support.              -antipsychotic agents: Seroquel   - 9-18: Sleeping well, mood well-controlled.  Start sleep log.--Sleeping appropriately.  5. Neuropsych/cognition/mixed dementia: This patient is not capable of making decisions on her own behalf.  - Per patient's wife, managed by Dr. Rosemarie as outpatient  - On Aricept   10 mg nightly, resumed while in hospital; resume here 9-18  6. Skin/Wound Care: Routine pressure relief measures.  7. Fluids/Electrolytes/Nutrition: Strict I/O. Check CMET in am   - 9/18: Add K CL 40 meq daily x3 days  8. Aspiration PNA: Completed course of Rocephin . Started on Augmentin  09/17 for increase in Right base opacity. .             --WBC trending up 12.8-->15.2-->18.7-->20.4. Steroid effect?              --  Recheck CXR in am -no consolidations or effusions   - 9/18: WBC downtrending 13.5; unfortunately did aspirate this a.m.  Discussed with pharmacy, should be covered by Augmentin , complete course.  9. ST elevation/ Acute sytolic CHF: Takotsubo v/s LAD disease. Strict I/O. Daily weights --Weight 167 at admission -->165 on 09/17.  Weight stable 9/21 weight trending down, will decrease Lasix  to daily from twice daily Filed Weights   05/22/24 0445 05/23/24 0500 05/24/24 9361  Weight: 74.1 kg 71.7 kg 67.6 kg    10. Acute renal failure: BUN/SCr on steady rise with IV lasix  started on 09/16             --Recheck labs in am and may need to back off of worsening- -improving 1.8->1.4 9-18; continue current regimen  11. Hypernatremia: Likely due to poor intake.  --Recheck BMET in am--resolved  12. T2DM: Hgb A1c-8.4. Monitor BS ac/hs and use SSI for elevated BS             --continue lantus  8 units BID-->may need to adjust as off tube feeds.    - -Relatively well-controlled on current regimen, trend  - 9/20-21 fair control continue current regimen and monitor Recent Labs    05/23/24 2104 05/24/24 0558 05/24/24 1149  GLUCAP 153* 97 184*     13.  Dysphagia: continue D2, nectar liquids--strict enforcement and supervision   14. LUE edema: Question due to IV infiltration v/s DVT. Dopplers ordered   - Edema reduced, Dopplers pending--not yet performed 9-19, will repeat.  - 9/21 age indeterminate superficial vein thrombosis involving the left cephalic vein  15. New onset seizures:  continue Keppra .     - 9/19: Notable left upper extremity continuous tremor on exam; patient responsive, mild tremor in right upper extremity, per patient's wife this has been ongoing.  Continue Keppra  current dose.   18.  Right thigh pain.  Present since at least 9-8; reported 9-19. x-ray hip and knee ordered for today.  - X-ray concerning for occult fracture of right superior pubic rami - Discussed with Dr. Montine, appears stable, placed weightbearing as tolerated for now, can consider CT if pain unable to control -orders placed - 9/21  patient reporting pain everywhere and appears to be tender to touch throughout her body  19.  Constipation.  LBM 9/16 recorded.  - X-ray abdomen to check stool burden- normal, will order 34 g of MiraLAX - thickend  -LBM yesteray  20. Dysuria  - 9/21 order PVRs and check UA/urine culture LOS: 4 days A FACE TO FACE EVALUATION WAS PERFORMED  Murray Collier 05/24/2024, 12:08 PM

## 2024-05-25 LAB — CBC
HCT: 32.4 % — ABNORMAL LOW (ref 36.0–46.0)
Hemoglobin: 10.7 g/dL — ABNORMAL LOW (ref 12.0–15.0)
MCH: 31.2 pg (ref 26.0–34.0)
MCHC: 33 g/dL (ref 30.0–36.0)
MCV: 94.5 fL (ref 80.0–100.0)
Platelets: 343 K/uL (ref 150–400)
RBC: 3.43 MIL/uL — ABNORMAL LOW (ref 3.87–5.11)
RDW: 13.8 % (ref 11.5–15.5)
WBC: 12.2 K/uL — ABNORMAL HIGH (ref 4.0–10.5)
nRBC: 0 % (ref 0.0–0.2)

## 2024-05-25 LAB — OCCULT BLOOD X 1 CARD TO LAB, STOOL: Fecal Occult Bld: POSITIVE — AB

## 2024-05-25 LAB — BASIC METABOLIC PANEL WITH GFR
Anion gap: 9 (ref 5–15)
BUN: 33 mg/dL — ABNORMAL HIGH (ref 8–23)
CO2: 33 mmol/L — ABNORMAL HIGH (ref 22–32)
Calcium: 8.3 mg/dL — ABNORMAL LOW (ref 8.9–10.3)
Chloride: 98 mmol/L (ref 98–111)
Creatinine, Ser: 1.4 mg/dL — ABNORMAL HIGH (ref 0.44–1.00)
GFR, Estimated: 38 mL/min — ABNORMAL LOW (ref >60)
Glucose, Bld: 111 mg/dL — ABNORMAL HIGH (ref 70–99)
Potassium: 4 mmol/L (ref 3.5–5.1)
Sodium: 140 mmol/L (ref 135–145)

## 2024-05-25 LAB — IRON AND TIBC
Iron: 63 ug/dL (ref 28–170)
Saturation Ratios: 20 % (ref 10.4–31.8)
TIBC: 314 ug/dL (ref 250–450)
UIBC: 251 ug/dL

## 2024-05-25 LAB — FERRITIN: Ferritin: 415 ng/mL — ABNORMAL HIGH (ref 11–307)

## 2024-05-25 LAB — GLUCOSE, CAPILLARY
Glucose-Capillary: 90 mg/dL (ref 70–99)
Glucose-Capillary: 143 mg/dL — ABNORMAL HIGH (ref 70–99)
Glucose-Capillary: 129 mg/dL — ABNORMAL HIGH (ref 70–99)
Glucose-Capillary: 150 mg/dL — ABNORMAL HIGH (ref 70–99)

## 2024-05-25 NOTE — Progress Notes (Signed)
 Physical Therapy Session Note  Patient Details  Name: Beth Macdonald MRN: [id number] Date of Birth: 1946/05/08  Today's Date: 05/25/2024 PT Individual Time: 1100-1200 PT Individual Time Calculation (min): 60 min   Short Term Goals: Week 1:  PT Short Term Goal 1 (Week 1): Pt will complete bed mobility with maxA +1. PT Short Term Goal 2 (Week 1): Pt will complete bed to chair transfer with maxA +1. PT Short Term Goal 3 (Week 1): Pt will complete static sitting balance for ~5 minutes with CGA.  Skilled Therapeutic Interventions/Progress Updates:    Pt in TIS when PT arrived. Pt c/o needing to have BM, needing to get out of TIS, get up.  Pt states she hurts stomach and all over. Perseverates on getting up out of TIS, getting into bed, out of bed, into TIS, standing/sitting throughout session.  Pt is max A for sit<->stand x5, stand pivot transfers bed<->TIS x2.  Max +2 for bed positioning, rolling.  In TIS, pt performed LAQ, marching x5-10 sporadically no pattern.  During session, pt's wife, niece and family friend came to visit.  Pt returned to bed, rubbing her stomach c/o pain. Pt recently had scan to abdomen. Pt left in bed with family in room, bed alarm set.   Therapy Documentation Precautions:  Precautions Precautions: Fall, Other (comment) Recall of Precautions/Restrictions: Impaired Precaution/Restrictions Comments: L inattention Restrictions Weight Bearing Restrictions Per Provider Order: No  Pain: Pain Assessment Pain Scale: Faces Faces Pain Scale: Hurts even more Pain Location: stomach, all over Pain Intervention(s): Medication (See eMAR)   Therapy/Group: Individual Therapy  Arland GORMAN Fast 05/25/2024, 4:04 PM

## 2024-05-25 NOTE — Progress Notes (Signed)
 Occupational Therapy Session Note  Patient Details  Name: Beth Macdonald MRN: [id number] Date of Birth: 01/11/1946  Today's Date: 05/25/2024 OT Individual Time: 9064-8954 OT Individual Time Calculation (min): 70 min    Short Term Goals: Week 1:  OT Short Term Goal 1 (Week 1): Pt able to sit EOB/EOM with min A statically in prep for ADL task OT Short Term Goal 2 (Week 1): Pt will don shirt with max A OT Short Term Goal 3 (Week 1): Pt will transfer to w/c out of bed with max A +1 OT Short Term Goal 4 (Week 1): Pt will be oriented x3 with min external aids OT Short Term Goal 5 (Week 1): Pt will use bilateral hands in grooming tasks with mod A  Skilled Therapeutic Interventions/Progress Updates:    Pt received supine, calling out for help. Her left hand was covered in stool and she was heavily incontinent of urine and stool. She was not oriented to anything but herself. She rolled R with mod A- improved pain management during bed mobility. She required extra time and total A +2 for peri hygiene. She was incontinent again of urine and bladder when still supine. All together she rolled R/L x5 each side, with min-mod A overall using bed rail. She came to EOB with max A and required min A for static sitting balance. She required total A to don pants. She completed sit > stand x2 with max A. Max A stand pivot to the TIS w/c. She completed oral care seated in the TIS w/c with min A. She was then taken outside in the w/c to increase orientation and provide increase in mood/psychosocial adjustment from getting sunlight and fresh air. She reported she needed to use the bathroom and was brought back inside. Stand pivot to the [facility] over the toilet with max A. She required total A for clothing management and required x3 more stands to thoroughly remove LB clothing d/t poor standing tolerance. Pt also requiring heavy facilitation for upright trunk. She had no void despite sitting for several minutes. Max A stand  pivot back to the w/c. Engaged pt in hair washing at the sink d/t flaking dry scalp. She reported pain throughout hair washing in her scalp but it was often when OT was barely even touching her so difficult to discern generalized discomfort overall vs localized. Pt was left sitting up in the wheelchair with all needs met, chair alarm set, and call bell within reach.    Therapy Documentation Precautions:  Precautions Precautions: Fall, Other (comment) Recall of Precautions/Restrictions: Impaired Precaution/Restrictions Comments: L inattention Restrictions Weight Bearing Restrictions Per Provider Order: No     Therapy/Group: Individual Therapy  Nena VEAR Moats 05/25/2024, 10:51 AM

## 2024-05-25 NOTE — Progress Notes (Signed)
 Speech Language Pathology Daily Session Note  Patient Details  Name: Beth Macdonald MRN: [id number] Date of Birth: [birth year]  Today's Date: 05/25/2024 SLP Individual Time: 8654-8582 SLP Individual Time Calculation (min): 32 min and Today's Date: 05/25/2024 SLP Missed Time: 28 Minutes Missed Time Reason: Nursing care;Patient fatigue  Short Term Goals: Week 1: SLP Short Term Goal 1 (Week 1): Pt will tolerate trials of nectar thick liquids w/ no overt s/s of airway invasion 80% of the time or greater SLP Short Term Goal 2 (Week 1): Pt will tolerate trials of thin liquids w/ no overt s/s of airway invasion 60% of the time or greater SLP Short Term Goal 3 (Week 1): Pt will solve functional problems w/ maxA SLP Short Term Goal 4 (Week 1): Pt will complete functional naming tasks w/ maxA SLP Short Term Goal 5 (Week 1): Pt will utilize visual aids as needed to provide orientation information w/ maxA  Skilled Therapeutic Interventions:   Pt greeted at bedside after nursing completed pt care (-10 mins). She was visibly fatigued and required maxA to participate in tx tasks targeting cognition and dysphagia. Despite just completing toileting, pt was perseverative on sensation to void. SLP assisted pt on to the bedpan and she required maxA for sustained attention and problem solving. She was provided w/ plenty of time to attempt, but was unable to urinate. She was then assisted w/ oral care via suctioning prior to initiation of PO trials. She was DEP for oral care d/t fatigue and no initiation. SLP planned to complete PO trials w/ ice chips, however, pt was unable to remain awake/alert enough for any PO intake. Tx tasks discontinued. Remaining 18 mins of tx session missed. She was left in bed w/ the alarm set and call light within reach. Recommend cont ST per POC.   Pain Pain Assessment Pain Scale: Faces Faces Pain Scale: Hurts even more Pain Location: Vagina Pain Intervention(s): Medication (See  eMAR)  Therapy/Group: Individual Therapy  Beth Macdonald 05/25/2024, 2:26 PM

## 2024-05-25 NOTE — Care Management (Signed)
 [facility] Individual Statement of Services  Patient Name:  Beth Macdonald  Date:  05/25/2024  Welcome to the [facility]patient [facility].  Our goal is to provide you with an individualized program based on your diagnosis and situation, designed to meet your specific needs.  With this comprehensive rehabilitation program, you will be expected to participate in at least 3 hours of rehabilitation therapies Monday-Friday, with modified therapy programming on the weekends.  Your rehabilitation program will include the following services:  Physical Therapy (PT), Occupational Therapy (OT), Speech Therapy (ST), 24 hour per day rehabilitation nursing, Therapeutic Recreaction (TR), Psychology, Neuropsychology, Care Coordinator, Rehabilitation Medicine, Nutrition Services, Pharmacy Services, and Other  Weekly team conferences will be held on Tuesday to discuss your progress.  Your Inpatient Rehabilitation Care Coordinator will talk with you frequently to get your input and to update you on team discussions.  Team conferences with you and your family in attendance may also be held.  Expected length of stay: 10-14 days   Overall anticipated outcome: Moderate Assistance  Depending on your progress and recovery, your program may change. Your Inpatient Rehabilitation Care Coordinator will coordinate services and will keep you informed of any changes. Your Inpatient Rehabilitation Care Coordinator's name and contact numbers are listed  below.  The following services may also be recommended but are not provided by the Inpatient [facility] Center:  Driving Evaluations Home Health Rehabiltiation Services Outpatient Rehabilitation Services Vocational Rehabilitation   Arrangements will be made to provide these services after discharge if needed.  Arrangements include referral to agencies that provide these services.  Your insurance has been verified to be:  [employer]  Your primary doctor is:  Ryan Hives  Pertinent information will be shared with your doctor and your insurance company.  Inpatient Rehabilitation Care Coordinator:  Graeme Jude, KEN [phone] or [phone]  Information discussed with and copy given to patient by: Graeme DELENA Jude, 05/25/2024, 9:32 AM

## 2024-05-25 NOTE — Progress Notes (Signed)
Stool sample collected and sent to lab per MD order.   Tilden Dome, LPN

## 2024-05-25 NOTE — Progress Notes (Addendum)
 PROGRESS NOTE   Subjective/Complaints:  No events overnight.  No further groin pain.  Friend at bedside, asking questions about use of bedpan versus transfer to toilet if possible. UA negative, required 1x ISC yesterday.  Nursing describes ongoing dysuria. HgB down to 10.7; other labs stable - getting iron studies, fobt Still eating very poorly, states she does not have an appetite--on chart review, actually eating well 30 to 90% of meals.   ROS: Denies fevers, chills, N/V,  constipation, diarrhea, SOB, cough, chest pain, new weakness or paraesthesias.   Dysuria--ongoing Right hip pain--ongoing  Objective:   VAS US  UPPER EXTREMITY VENOUS DUPLEX Result Date: 05/24/2024 UPPER VENOUS STUDY  Patient Name:  Beth Macdonald  Date of Exam:   05/24/2024 Medical Rec #: [id number]          Accession #:    [id number] Date of Birth: 10/16/1945          Patient Gender: F Patient Age:   78 years Exam Location:  [facility] Procedure:      VAS US  UPPER EXTREMITY VENOUS DUPLEX Referring Phys: JOESPH LIKES --------------------------------------------------------------------------------  Indications: Edema, and stroke Comparison Study: No prior exam. Performing Technologist: Edilia Elden Appl  Examination Guidelines: A complete evaluation includes B-mode imaging, spectral Doppler, color Doppler, and power Doppler as needed of all accessible portions of each vessel. Bilateral testing is considered an integral part of a complete examination. Limited examinations for reoccurring indications may be performed as noted.  Right Findings: +----------+------------+---------+-----------+----------+-------+ RIGHT     CompressiblePhasicitySpontaneousPropertiesSummary +----------+------------+---------+-----------+----------+-------+ IJV           Full       Yes       Yes                       +----------+------------+---------+-----------+----------+-------+ Subclavian               Yes       Yes                      +----------+------------+---------+-----------+----------+-------+  Left Findings: +----------+------------+---------+-----------+----------+-------+ LEFT      CompressiblePhasicitySpontaneousPropertiesSummary +----------+------------+---------+-----------+----------+-------+ IJV           Full       Yes       Yes                      +----------+------------+---------+-----------+----------+-------+ Subclavian               Yes       Yes                      +----------+------------+---------+-----------+----------+-------+ Axillary      Full       Yes       Yes                      +----------+------------+---------+-----------+----------+-------+ Brachial      Full       Yes       Yes                      +----------+------------+---------+-----------+----------+-------+  Radial        Full                                          +----------+------------+---------+-----------+----------+-------+ Ulnar         Full                                          +----------+------------+---------+-----------+----------+-------+ Cephalic      None       No        No                       +----------+------------+---------+-----------+----------+-------+ Basilic       Full       Yes       Yes                      +----------+------------+---------+-----------+----------+-------+ Evidence of acute to age indeterminate superficial vein thrombosis noted in the left cephalic vein from the antecubital fossa to the clavicular area, just distal to the without infiltrating the subclavian vein.  Summary:  Right: No evidence of thrombosis in the subclavian.  Left: No evidence of deep vein thrombosis in the upper extremity. Findings consistent with acute superficial vein thrombosis involving the left cephalic vein. Findings consistent with  age indeterminate superficial vein thrombosis involving the left cephalic vein.  *See table(s) above for measurements and observations.  Diagnosing physician: Penne Colorado MD Electronically signed by Penne Colorado MD on 05/24/2024 at 12:03:13 PM.    Final    DG Abd 2 Views Result Date: 05/23/2024 EXAM: 2 VIEW XRAY OF THE ABDOMEN 05/23/2024 07:02:11 PM COMPARISON: None available. CLINICAL HISTORY: Constipation FINDINGS: BOWEL: Normal colonic stool burden. Contrast extending from the cecum to the rectum. Nonobstructive bowel gas pattern. SOFT TISSUES: No opaque urinary calculi. BONES: No acute osseous abnormality. IMPRESSION: 1. Negative. Electronically signed by: Pinkie Pebbles MD 05/23/2024 07:10 PM EDT RP Workstation: [id number]   Recent Labs    05/25/24 0523  WBC 12.2*  HGB 10.7*  HCT 32.4*  PLT 343    Recent Labs    05/25/24 0523  NA 140  K 4.0  CL 98  CO2 33*  GLUCOSE 111*  BUN 33*  CREATININE 1.40*  CALCIUM  8.3*     Intake/Output Summary (Last 24 hours) at 05/25/2024 0823 Last data filed at 05/25/2024 0745 Gross per 24 hour  Intake 1044 ml  Output 400 ml  Net 644 ml        Physical Exam: Vital Signs Blood pressure 138/63, pulse 78, temperature 98.3 F (36.8 C), resp. rate 17, height 5' 8 (1.727 m), weight 67.7 kg, SpO2 98%.  Constitutional: No apparent distress. Appropriate appearance for age.  Laying in bed. HENT: No JVD. Neck Supple. Trachea midline.  Eyes:  PERRLA. EOMI. Visual fields grossly intact.  Cardiovascular: RRR, no murmurs/rub/gallops.  1+ left upper extremity edema. Peripheral pulses 2+  Respiratory: CTAB. No rales, rhonchi, or wheezing. On RA.   Abdomen: + bowel sounds, normoactive. No distention Skin: C/D/I. No apparent lesions.    MSK:      No apparent deformity.      Right lower extremity no longer tender         Cognition: AAO to person only; not  place, time, or event.--Unchanged Language: Fluent, but hypophonic and with occasional  perseveration. Memory: Severe deficits. Insight: Poor insight into current condition.  Mood: Pleasant affect, appropriate mood.  Sensation: To light touch intact in BL UEs and LEs  Reflexes: 2+ in BL UE and LEs. Negative Hoffman's and babinski signs bilaterally.  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       Strength: Unchanged 9-22                RUE: 5/5 SA, 5/5 EF, 5/5 EE, 5/5 WE, 5/5 FF, 5/5 FA                LUE:  3/5 SA, 4/5 EF, 4/5 EE, 4/5 WE, 4/5 FF, 4/5 FA                RLE: 1/5 HF, 3/5 KE, 4/5  DF, 4/5  EHL, 4/5  PF                 LLE:  1/5 HF, 1/5 KE, 4/5  DF, 4/5  EHL, 4/5  PF       Assessment/Plan: 1. Functional deficits which require 3+ hours per day of interdisciplinary therapy in a comprehensive inpatient rehab setting. Physiatrist is providing close team supervision and 24 hour management of active medical problems listed below. Physiatrist and rehab team continue to assess barriers to discharge/monitor patient progress toward functional and medical goals  Care Tool:  Bathing              Bathing assist Assist Level: 2 Helpers     Upper Body Dressing/Undressing Upper body dressing        Upper body assist Assist Level: Total Assistance - Patient < 25%    Lower Body Dressing/Undressing Lower body dressing      What is the patient wearing?: Pants, Incontinence brief     Lower body assist Assist for lower body dressing: 2 Helpers     Toileting Toileting    Toileting assist Assist for toileting: 2 Helpers     Transfers Chair/bed transfer  Transfers assist  Chair/bed transfer activity did not occur: Safety/medical concerns  Chair/bed transfer assist level: 2 Helpers     Locomotion Ambulation   Ambulation assist      Assist level: 2 helpers (+3 with WC follow) Assistive device:  (3  musketeers) Max distance: 4'   Walk 10 feet activity   Assist  Walk 10 feet activity did not occur: Safety/medical concerns        Walk 50 feet activity   Assist Walk 50 feet with 2 turns activity did not occur: Safety/medical concerns         Walk 150 feet activity   Assist Walk 150 feet activity did not occur: Safety/medical concerns         Walk 10 feet on uneven surface  activity   Assist Walk 10 feet on uneven surfaces activity did not occur: Safety/medical concerns         Wheelchair     Assist Is the patient using a wheelchair?: Yes      Wheelchair assist level: Dependent - Patient 0% Max wheelchair distance: 150'    Wheelchair 50 feet with 2 turns activity    Assist        Assist Level: Dependent - Patient 0%   Wheelchair 150 feet activity  Assist      Assist Level: Dependent - Patient 0%   Blood pressure 138/63, pulse 78, temperature 98.3 F (36.8 C), resp. rate 17, height 5' 8 (1.727 m), weight 67.7 kg, SpO2 98%.  Medical Problem List and Plan: 1. Functional deficits secondary to seizure disorder with subsequent encephalopathy             -patient may shower             -ELOS/Goals: MaxA 10-\14 days, then d/c to SNF, goal of CIR is to reduce burden of care             Stable to continue CIR  - Teams tomorrow a.m.  2.  Antithrombotics: -DVT/anticoagulation:  Pharmaceutical: Lovenox              -antiplatelet therapy: ASA/Plavis resumed 09/10.  3. Pain Management: tylenol  prn.    - 9/19: Right thigh pain with new findings of superior pubic rami fracture below; scheduled Tylenol  1000 mg 3 times daily, add as needed oxycodone  2.5 mg every 8 hours as needed for severe pain  - 9/20 inconsistent reports of pain, may be associated with cognition deficits.  Does not look uncomfortable and was noted to be sleeping earlier  - 9/21, patient indicates discomfort with palpation throughout her body, throughout arms and legs,  clavicles, and abdomen, head.  Nursing documented 0 10 earlier this morning.  9-22: No further pain from hip.  Dysuria as below.  4. Mood/Behavior/Sleep: LCSW to follow for evaluation and support.              -antipsychotic agents: Seroquel   - 9-18: Sleeping well, mood well-controlled.  Start sleep log.--Sleeping appropriately.  5. Neuropsych/cognition/mixed dementia: This patient is not capable of making decisions on her own behalf.  - Per patient's wife, managed by Dr. Rosemarie as outpatient  - On Aricept  10 mg nightly, resumed while in hospital; resume here 9-18  - 9-22: No notable improvements in cognition since admission to inpatient rehab.  6. Skin/Wound Care: Routine pressure relief measures.  7. Fluids/Electrolytes/Nutrition: Strict I/O. Check CMET in am   - 9/18: Add K CL 40 meq daily x3 days  8. Aspiration PNA: Completed course of Rocephin . Started on Augmentin  09/17 for increase in Right base opacity. .             --WBC trending up 12.8-->15.2-->18.7-->20.4. Steroid effect?              --Recheck CXR in am -no consolidations or effusions   - 9/18: WBC downtrending 13.5; unfortunately did aspirate this a.m.  Discussed with pharmacy, should be covered by Augmentin , complete course.  9-22: WBC continues to downtrend, 12.2  9. ST elevation/ Acute sytolic CHF: Takotsubo v/s LAD disease. Strict I/O. Daily weights --Weight 167 at admission -->165 on 09/17.  Weight stable 9/21 weight trending down, will decrease Lasix  to daily from twice daily 9-22: Weight stable. Filed Weights   05/23/24 0500 05/24/24 0638 05/25/24 0515  Weight: 71.7 kg 67.6 kg 67.7 kg    10. Acute renal failure: BUN/SCr on steady rise with IV lasix  started on 09/16             --Recheck labs in am and may need to back off of worsening- -improving 1.8->1.4; stabilizing around 1.4  11. Hypernatremia: Likely due to poor intake.  --Recheck BMET in am--resolved  12. T2DM: Hgb A1c-8.4. Monitor BS ac/hs and use SSI  for elevated BS             --  continue lantus  8 units BID-->may need to adjust as off tube feeds.    - -Relatively well-controlled on current regimen, trend  - 9/20-22 fair control continue current regimen and monitor Recent Labs    05/24/24 1650 05/24/24 2041 05/25/24 0610  GLUCAP 172* 144* 90     13.  Dysphagia: continue D2, nectar liquids--strict enforcement and supervision   14. LUE edema: Question due to IV infiltration v/s DVT. Dopplers ordered   - Edema reduced, Dopplers pending--not yet performed 9-19, will repeat.  - 9/21 age indeterminate superficial vein thrombosis involving the left cephalic vein--treat conservatively with heat, compression  15. New onset seizures: continue Keppra .     - 9/19: Notable left upper extremity continuous tremor on exam; patient responsive, mild tremor in right upper extremity, per patient's wife this has been ongoing.  Continue Keppra  current dose.  9-22: No further tremor noted   18.  Right thigh pain.  Present since at least 9-8; reported 9-19. x-ray hip and knee ordered for today.  - X-ray concerning for occult fracture of right superior pubic rami - Discussed with Dr. Montine, appears stable, placed weightbearing as tolerated for now, can consider CT if pain unable to control -orders placed  19.  Constipation.  LBM 9/16 recorded.  - X-ray abdomen to check stool burden- normal, will order 34 g of MiraLAX - thickend  -LBM 9/21, large  20. Dysuria  - 9/21 order PVRs and check UA/urine culture--negative UA, elevated PVRs, start timed toiletting  9-22: Per nursing reports, patient endorsing vaginal pain.  Will check in a.m., dysuria may be secondary to yeast infection versus age-related vulvovaginal atrophy  21.  Anemia.  Worsening from 12.3 on admission to 10.7.  - FOBT pending  - Iron studies show elevated ferritin, normal TIBC and iron level.  - Trend with repeat labs Thursday.  No obvious sources of bleeding.  LOS: 5 days A FACE TO  FACE EVALUATION WAS PERFORMED  Joesph JAYSON Likes 05/25/2024, 8:23 AM

## 2024-05-25 NOTE — Progress Notes (Signed)
 Patient ID: SHAVAWN STOBAUGH, female   DOB: 05/26/1946, 78 y.o.   MRN: [id number]  [id number] left message for pt wife to inform on ELOS. SW inquired if SNF was still the plan and waiting  on follow-up.  *SW went by pt room and met with pt wife, niece, and friend and discussed if SNF placement is still the plan. Family was looking at ALF. SW shared pt not appropriate for ALF at this time and SNF would be best option. SW explained SNF placement process. SW will provide SNF list. SW encouraged them to view facilities and select preference 3-5 SNF locations.   *SW provided SNF list ([email or web address]).   Graeme Jude, MSW, LCSW Office: [phone] Cell: [phone] Fax: [phone]

## 2024-05-25 NOTE — Progress Notes (Addendum)
 Met with patient and spouse Beth Macdonald) to review current situation, team conference and plan of care. Reviewed medications, DM, seizure dx, diet, aspiration precautions, follow up with MD, pain, bowel, bladder,and sleep. Spouse expressed concerns re: discharge care. Continue to follow along to provide educational needs to facilitate preparation for discharge.

## 2024-05-26 LAB — GLUCOSE, CAPILLARY
Glucose-Capillary: 124 mg/dL — ABNORMAL HIGH (ref 70–99)
Glucose-Capillary: 115 mg/dL — ABNORMAL HIGH (ref 70–99)
Glucose-Capillary: 169 mg/dL — ABNORMAL HIGH (ref 70–99)
Glucose-Capillary: 158 mg/dL — ABNORMAL HIGH (ref 70–99)

## 2024-05-26 MED ORDER — PANTOPRAZOLE SODIUM 40 MG PO TBEC
40.0000 mg | DELAYED_RELEASE_TABLET | Freq: Two times a day (BID) | ORAL | Status: DC
  Administered 2024-05-26: 40 mg via ORAL
  Filled 2024-05-26 (×2): qty 1

## 2024-05-26 MED ORDER — ZINC OXIDE 12.8 % EX OINT: TOPICAL_OINTMENT | CUTANEOUS | Status: DC | PRN

## 2024-05-26 MED ORDER — HYDROCORTISONE ACETATE 25 MG RE SUPP: 25.0000 mg | Freq: Two times a day (BID) | RECTAL | Status: DC | PRN

## 2024-05-26 MED ORDER — MEGESTROL ACETATE 400 MG/10ML PO SUSP
200.0000 mg | Freq: Two times a day (BID) | ORAL | Status: DC
  Administered 2024-05-26 – 2024-05-27 (×3): 200 mg via ORAL
  Filled 2024-05-26 (×3): qty 10

## 2024-05-26 MED ORDER — NYSTATIN 100000 UNIT/GM EX POWD
Freq: Two times a day (BID) | CUTANEOUS | Status: DC
  Filled 2024-05-26: qty 15

## 2024-05-26 MED ORDER — FUROSEMIDE 40 MG PO TABS
40.0000 mg | ORAL_TABLET | Freq: Every day | ORAL | Status: DC
  Administered 2024-05-27 – 2024-05-28 (×2): 40 mg via ORAL
  Filled 2024-05-26 (×2): qty 1

## 2024-05-26 NOTE — Progress Notes (Signed)
 Speech Language Pathology Daily Session Note  Patient Details  Name: Beth Macdonald MRN: [id number] Date of Birth: Dec 27, 1945  Today's Date: 05/26/2024 SLP Individual Time: 9154-9099 SLP Individual Time Calculation (min): 15 min and Today's Date: 05/26/2024 SLP Missed Time: 30 Minutes Missed Time Reason: Patient fatigue  SLP Individual Time: 8851-8799 SLP Individual Time Calculation (min): 12 min    Short Term Goals: Week 1: SLP Short Term Goal 1 (Week 1): Pt will tolerate trials of nectar thick liquids w/ no overt s/s of airway invasion 80% of the time or greater SLP Short Term Goal 2 (Week 1): Pt will tolerate trials of thin liquids w/ no overt s/s of airway invasion 60% of the time or greater SLP Short Term Goal 3 (Week 1): Pt will solve functional problems w/ maxA SLP Short Term Goal 4 (Week 1): Pt will complete functional naming tasks w/ maxA SLP Short Term Goal 5 (Week 1): Pt will utilize visual aids as needed to provide orientation information w/ maxA  Skilled Therapeutic Interventions: [phone]: Pt greeted at bedside for tx targeting cognition and expression. She was asleep upon SLP arrival and required maxA verbal/tactile cueing to wake initially. With continued cueing, she was able to participate in orientation review via calendar. However, no verbalization attempts noted so information was provided via errorless learning. In conversational tasks re pt preferences and comfortability, she answered Y/N w/ maxA for initiation. Unable to complete additional tx tasks d/t pt fatigue.   8851-8799: SLP returned to make up missed time. She remained very fatigued and required maxA to remain awake. She did demonstrate slightly improved initiation of responses as compared to this AM. SLP facilitated orientation review again and pt was able to utilize the calendar provided to state the year w/ modA. Remaining info provided via errorless learning. During functional problem solving task, pt  demonstrated difficulty utilizing traditional call light and was provided w/ a soft tough call light instead. She demonstrated the ability to use the call light, though anticipate initiation and problem solving will continue to negatively impact appropriate use of call light. She was left in bed w/ the alarm set and call light within reach. Recommend cont ST per POC.  Pain Pain Assessment Pain Scale: Faces Faces Pain Scale: Hurts little more Pain Location: Generalized Pain Intervention(s): Medication (See eMAR)  Therapy/Group: Individual Therapy  [name] 05/26/2024, 10:33 AM

## 2024-05-26 NOTE — Progress Notes (Signed)
 Patient ID: Beth Macdonald, female   DOB: [birth year], 78 y.o.   MRN: [id number]  SW went by room to give updates to pt and family but no family in the room. SW will follow-up with family.   Graeme Jude, MSW, LCSW Office: [phone] Cell: [phone] Fax: [phone]

## 2024-05-26 NOTE — Progress Notes (Signed)
 Physical Therapy Session Note  Patient Details  Name: RHAPSODY WOLVEN MRN: [id number] Date of Birth: 1946-01-27  Today's Date: 05/26/2024 PT Individual Time: 1332-1444 PT Individual Time Calculation (min): 72 min   Short Term Goals: Week 1:  PT Short Term Goal 1 (Week 1): Pt will complete bed mobility with maxA +1. PT Short Term Goal 2 (Week 1): Pt will complete bed to chair transfer with maxA +1. PT Short Term Goal 3 (Week 1): Pt will complete static sitting balance for ~5 minutes with CGA.  Skilled Therapeutic Interventions/Progress Updates:     Pt received semi reclined in bed and agrees to therapy. No complaint of pain. Pt performs supine to sit with maxA and cues for sequencing and positioning. Pt reports significant pain in pelvis during mobility. PT provides rest breaks as needed and gentle mobility to manage pain, as well as providing education on likely source of pain. Pt performs sit to stand and stand step transfer to [facility] with maxA and cues for initiation, sequencing, and safety. Pt sits on mat with close supervision to work on core strength and activity tolerance. Pt performs repeated repetitions of sit to stand with modA and cues for hand placement and sequencing. Pt immediately verbalizes discomfort, though does not specify location of pain, upon standing, requesting to sit back down. Pt completes total of x7 reps sit to stand with extended seated rest break. Pt then transitions to supine with maxA to allow pt to be in position of comfort. Pt performs 3x10 SAQs with verbal and tactile cues for correct performance. Pt then performs 3x10 hip external rotation in hooklying with yellow theraband for resistance. Pt performs 2x10 bridges in hooklying to work on coordination and core strength. Supine to sit with maxA and cues for sequencing. Stand step back to [facility] with maxA and no AD, with cues for body mechanics, sequencing, and positioning. Left seated reclined in tilt in space with alarm  intact and all needs within reach.    Therapy Documentation Precautions:  Precautions Precautions: Fall, Other (comment) Recall of Precautions/Restrictions: Impaired Precaution/Restrictions Comments: L inattention Restrictions Weight Bearing Restrictions Per Provider Order: No    Therapy/Group: Individual Therapy  Elsie JAYSON Dawn, PT, DPT 05/26/2024, 4:06 PM

## 2024-05-26 NOTE — Plan of Care (Signed)
 Goals downgraded to reflect SNF discharge plan   Problem: RH Balance Goal: LTG: Patient will maintain dynamic sitting balance (OT) Description: LTG:  Patient will maintain dynamic sitting balance with assistance during activities of daily living (OT) Flowsheets (Taken 05/26/2024 1534) LTG: Pt will maintain dynamic sitting balance during ADLs with: (downgraded 9/23- SD) Minimal Assistance - Patient > 75% Goal: LTG Patient will maintain dynamic standing with ADLs (OT) Description: LTG:  Patient will maintain dynamic standing balance with assist during activities of daily living (OT)  Flowsheets (Taken 05/26/2024 1534) LTG: Pt will maintain dynamic standing balance during ADLs with: (downgraded 9/23- SD) Maximal Assistance - Patient 25 - 49%   Problem: Sit to Stand Goal: LTG:  Patient will perform sit to stand in prep for activites of daily living with assistance level (OT) Description: LTG:  Patient will perform sit to stand in prep for activites of daily living with assistance level (OT) Flowsheets (Taken 05/26/2024 1534) LTG: PT will perform sit to stand in prep for activites of daily living with assistance level: (downgraded 9/23- SD) Maximal Assistance - Patient 25 - 49%   Problem: RH Bathing Goal: LTG Patient will bathe all body parts with assist levels (OT) Description: LTG: Patient will bathe all body parts with assist levels (OT) Flowsheets (Taken 05/26/2024 1534) LTG: Pt will perform bathing with assistance level/cueing: (downgraded 9/23- SD) Maximal Assistance - Patient 25 - 49%   Problem: RH Toileting Goal: LTG Patient will perform toileting task (3/3 steps) with assistance level (OT) Description: LTG: Patient will perform toileting task (3/3 steps) with assistance level (OT)  Flowsheets (Taken 05/26/2024 1534) LTG: Pt will perform toileting task (3/3 steps) with assistance level: (downgraded 9/23- SD) Total Assistance - Patient < 25%   Problem: RH Toilet Transfers Goal: LTG  Patient will perform toilet transfers w/assist (OT) Description: LTG: Patient will perform toilet transfers with assist, with/without cues using equipment (OT) Flowsheets (Taken 05/26/2024 1534) LTG: Pt will perform toilet transfers with assistance level of: (downgraded 9/23- SD) Maximal Assistance - Patient 25 - 49%   Problem: RH Tub/Shower Transfers Goal: LTG Patient will perform tub/shower transfers w/assist (OT) Description: LTG: Patient will perform tub/shower transfers with assist, with/without cues using equipment (OT) Outcome: Not Applicable   Problem: RH Attention Goal: LTG Patient will demonstrate this level of attention during functional activites (OT) Description: LTG:  Patient will demonstrate this level of attention during functional activites  (OT) Flowsheets (Taken 05/26/2024 1534) LTG: Patient will demonstrate this level of attention during functional activites (OT): (downgraded 9/23- SD) Maximal Assistance - Patient 25 - 49%   Problem: RH Awareness Goal: LTG: Patient will demonstrate awareness during functional activites type of (OT) Description: LTG: Patient will demonstrate awareness during functional activites type of (OT) Flowsheets (Taken 05/26/2024 1534) LTG: Patient will demonstrate awareness during functional activites type of (OT): (downgraded 9/23- SD) Maximal Assistance - Patient 25 - 49%

## 2024-05-26 NOTE — Patient Care Conference (Signed)
 Inpatient RehabilitationTeam Conference and Plan of Care Update Date: 05/26/2024   Time: 1023 am    Patient Name: Beth Macdonald      Medical Record Number: [id number]  Date of Birth: 1945/12/26 Sex: Female         Room/Bed: 4W11C/4W11C-01 Payor Info: Payor: MEDICARE RAILROAD / Plan: MEDICARE RAILROAD / Product Type: *No Product type* /    Admit Date/Time:  05/20/2024  5:13 PM  Primary Diagnosis:  Seizure disorder as sequela of cerebrovascular accident [facility])  Hospital Problems: Principal Problem:   Seizure disorder as sequela of cerebrovascular accident [facility]) Active Problems:   Type 2 diabetes mellitus without complication   Acute systolic congestive heart failure [facility])    Expected Discharge Date: Expected Discharge Date: 05/28/24  Team Members Present: Physician leading conference: Dr. Joesph Likes Social Worker Present: Graeme Jude, LCSW Nurse Present: Eulalio Falls, RN PT Present: Kirt Dawn, PT OT Present: Nena Moats, OT SLP Present: Recardo Mole, SLP PPS Coordinator present : Eleanor Colon, SLP     Current Status/Progress Goal Weekly Team Focus  Bowel/Bladder      Incontinent of bowel and bladder  Maintain continence     Assess bowel and bladder q shift  Swallow/Nutrition/ Hydration   Dys 2 / HTL - full supervision during meals   minA  adequate alertness to complete trials of nectar/thin, pt/family education,    ADL's   Max A transfers, max-total A ADLs overall d/t poor initiation. Total A toileting- incontinent. Confused ,not oriented   mod-max A overall for ADLs and transfers   Decrease burden of care- ADLs, transfers, generalized strengthening    Mobility   maxA bed mobility, sit to stand, and bed to chair transfers,   maxA  bed mobility, balance, transfers, activity tolerance    Communication   severe deficits - speech intelligible though language of confusion/paraphasias   modA (will need to downgrade)   pt/family education, functional  language tasks    Safety/Cognition/ Behavioral Observations  profound cognitive-linguistic deficits   modA (will need to downgrade)   pt/family education, compensatory memory strategies/aids, functional cognitive tasks    Pain      Lower extremity/ hip/ bladder pain   <4 w/ prns     Assess pain q shift  Skin      No skin issues   Skin free of any skin issues   Assess skin q shift     Discharge Planning:  Plan remains for pt to d/c to SNF when appropriate. Family has SNF list to review for preference. SW will confirm there are no barriers to discharge.    Team Discussion: Patient admitted post seizure disorder with encephalopathy. Patient progress limited by suprapubic pain due to fracture, LUE edema, dysuria , dysphagia ,poor po intake,  lethargy, poor initiation and confusion.   Patient on target to meet rehab goals: Currently patient needs max- total assistance with ADLs. Patient needs max- total assistance with sit to stand and bed to chair transfers. Patient with severe cognitive- linguistic deficits. Overall goals at discharge are set for mod-max assistance.   *See Care Plan and progress notes for long and short-term goals.   Revisions to Treatment Plan:  Downgraded goals Aqua therma heating pad  Dysphagia 2 honey thick liquids Supervision assistance with meals Dopplers Heat therapy Sleep pattern PVR's and timed toileting  Teaching Needs: Safety, medications, dietary modifications, transfers, toileting, etc.   Current Barriers to Discharge: Decreased caregiver support, Home enviroment access/layout, Incontinence, Medication compliance, and Behavior  Possible Resolutions to Barriers: Family Education SNF placement     Medical Summary Current Status: Medically complicated by seizures, cognitive deficits, dementia, incontinence, dysuria, right superior pubic rami fracture, severe protein calorie malnutrition, and anemia  Barriers to Discharge:  Behavior/Mood;Inadequate Nutritional Intake;Incontinence;Medical stability;Self-care education;Uncontrolled Pain   Possible Resolutions to [employer]: Titrate pain medications to avoid cognitive side effects but allow mobilization with right pubic rami fracture, encourage sleep/wake cycle and frequent reorientation for cognition/dementia issues, PVRs and timed toileting for incontinence, ongoing evaluation for dysuria, encourage p.o. intakes and start appetite stimulant   Continued Need for Acute Rehabilitation Level of Care: The patient requires daily medical management by a physician with specialized training in physical medicine and rehabilitation for the following reasons: Direction of a multidisciplinary physical rehabilitation program to maximize functional independence : Yes Medical management of patient stability for increased activity during participation in an intensive rehabilitation regime.: Yes Analysis of laboratory values and/or radiology reports with any subsequent need for medication adjustment and/or medical intervention. : Yes   I attest that I was present, lead the team conference, and concur with the assessment and plan of the team.   Deshante Cassell Gayo 05/26/2024, 1023 am

## 2024-05-26 NOTE — Progress Notes (Signed)
 Occupational Therapy Session Note  Patient Details  Name: Beth Macdonald MRN: [id number] Date of Birth: 12-27-1945  Today's Date: 05/26/2024 OT Individual Time: 9269-9164 OT Individual Time Calculation (min): 65 min    Short Term Goals: Week 1:  OT Short Term Goal 1 (Week 1): Pt able to sit EOB/EOM with min A statically in prep for ADL task OT Short Term Goal 2 (Week 1): Pt will don shirt with max A OT Short Term Goal 3 (Week 1): Pt will transfer to w/c out of bed with max A +1 OT Short Term Goal 4 (Week 1): Pt will be oriented x3 with min external aids OT Short Term Goal 5 (Week 1): Pt will use bilateral hands in grooming tasks with mod A  Skilled Therapeutic Interventions/Progress Updates:    Pt received resting in bed with nursing present for medication pass presenting to be in good spirits receptive to skilled OT session reporting 0/10 pain- OT offering intermittent rest breaks, repositioning, and therapeutic support to optimize participation in therapy session. Supine>EOB with MAX A and frequent cueing for initiation and increased time. Donned pants EOB with MAX A for weaving feet, pulling up pants and balance. Stand pivot to WC with MAX A. Tranported to bathroom via WC for safety. Stand pivot to raised toilet seat with MAX A. Pt with void on raised toilet seat requiring total A for posterior peri area hygiene. Completed hygiene while standing, sit<>stand x10 with MAX A, however improved. Noticeable poor standing tolerance, approximately 10 seconds MAX while second person completed hygiene. Pt able to initiate more this session with raising her buttocks approximately 2 inches from raised toilet seat. Required total A for clothing management with MAX A for standing balance. Stand pivot to WC with MAX A. Engaged Pt in eating breakfast, OT loading pureed fruit on spoon and Pt able to bring to mouth. Pt took 3 sips of honey thick orange juice and 5 sips of honey thick coffee. Transported Pt to  day room via WC d/t time management. Completed 5 reps of ball pass with 2# weighted ball with MAX verbal cues and increased time to complete. Discontinued session d/t fatigue. Pt was left resting in WC tilted back with call bell in reach, seatbelt alarm on, and all needs met. 10 minutes missed.    Therapy Documentation Precautions:  Precautions Precautions: Fall, Other (comment) Recall of Precautions/Restrictions: Impaired Precaution/Restrictions Comments: L inattention Restrictions Weight Bearing Restrictions Per Provider Order: No  Therapy/Group: Individual Therapy  Paulina Fleeta Dixie 05/26/2024, 10:09 AM

## 2024-05-26 NOTE — Progress Notes (Addendum)
 PROGRESS NOTE   Subjective/Complaints:  No events ovenright.  Continuing to complain of dysuria, feelings of bladder spasms to nursing staff.  On exam, complains of diffuse tenderness to touch throughout entire abdomen and bilateral lower extremities.  Vitals stable PO intakes remain very poor Bladder scans high but not quite high enough for cathing; 339 this a.m. has not a straight cath in the last 24 hours.  Is difficult cath per nursing.  ROS: Denies fevers, chills, N/V,  constipation, diarrhea, SOB, cough, chest pain, new weakness or paraesthesias.   Dysuria--ongoing Right hip pain--ongoing  Objective:   VAS US  UPPER EXTREMITY VENOUS DUPLEX Result Date: 05/24/2024 UPPER VENOUS STUDY  Patient Name:  Beth Macdonald  Date of Exam:   05/24/2024 Medical Rec #: [id number]          Accession #:    [id number] Date of Birth: 06-09-1946          Patient Gender: F Patient Age:   78 years Exam Location:  [facility] Procedure:      VAS US  UPPER EXTREMITY VENOUS DUPLEX Referring Phys: JOESPH LIKES --------------------------------------------------------------------------------  Indications: Edema, and stroke Comparison Study: No prior exam. Performing Technologist: Edilia Elden Appl  Examination Guidelines: A complete evaluation includes B-mode imaging, spectral Doppler, color Doppler, and power Doppler as needed of all accessible portions of each vessel. Bilateral testing is considered an integral part of a complete examination. Limited examinations for reoccurring indications may be performed as noted.  Right Findings: +----------+------------+---------+-----------+----------+-------+ RIGHT     CompressiblePhasicitySpontaneousPropertiesSummary +----------+------------+---------+-----------+----------+-------+ IJV           Full       Yes       Yes                       +----------+------------+---------+-----------+----------+-------+ Subclavian               Yes       Yes                      +----------+------------+---------+-----------+----------+-------+  Left Findings: +----------+------------+---------+-----------+----------+-------+ LEFT      CompressiblePhasicitySpontaneousPropertiesSummary +----------+------------+---------+-----------+----------+-------+ IJV           Full       Yes       Yes                      +----------+------------+---------+-----------+----------+-------+ Subclavian               Yes       Yes                      +----------+------------+---------+-----------+----------+-------+ Axillary      Full       Yes       Yes                      +----------+------------+---------+-----------+----------+-------+ Brachial      Full       Yes       Yes                      +----------+------------+---------+-----------+----------+-------+  Radial        Full                                          +----------+------------+---------+-----------+----------+-------+ Ulnar         Full                                          +----------+------------+---------+-----------+----------+-------+ Cephalic      None       No        No                       +----------+------------+---------+-----------+----------+-------+ Basilic       Full       Yes       Yes                      +----------+------------+---------+-----------+----------+-------+ Evidence of acute to age indeterminate superficial vein thrombosis noted in the left cephalic vein from the antecubital fossa to the clavicular area, just distal to the without infiltrating the subclavian vein.  Summary:  Right: No evidence of thrombosis in the subclavian.  Left: No evidence of deep vein thrombosis in the upper extremity. Findings consistent with acute superficial vein thrombosis involving the left cephalic vein. Findings consistent with  age indeterminate superficial vein thrombosis involving the left cephalic vein.  *See table(s) above for measurements and observations.  Diagnosing physician: Penne Colorado MD Electronically signed by Penne Colorado MD on 05/24/2024 at 12:03:13 PM.    Final    Recent Labs    05/25/24 0523  WBC 12.2*  HGB 10.7*  HCT 32.4*  PLT 343    Recent Labs    05/25/24 0523  NA 140  K 4.0  CL 98  CO2 33*  GLUCOSE 111*  BUN 33*  CREATININE 1.40*  CALCIUM  8.3*     Intake/Output Summary (Last 24 hours) at 05/26/2024 0921 Last data filed at 05/26/2024 0844 Gross per 24 hour  Intake 83 ml  Output 0 ml  Net 83 ml        Physical Exam: Vital Signs Blood pressure (!) 123/55, pulse 84, temperature 98.5 F (36.9 C), resp. rate 17, height 5' 8 (1.727 m), weight 71.2 kg, SpO2 97%.  Constitutional: No apparent distress. Appropriate appearance for age.  Laying in bed. HENT: No JVD. Neck Supple. Trachea midline.  Eyes:  PERRLA. EOMI. Visual fields grossly intact.  Cardiovascular: RRR, no murmurs/rub/gallops.  1+ left upper extremity edema. Peripheral pulses 2+  Respiratory: CTAB. No rales, rhonchi, or wheezing. On RA.   Abdomen: + bowel sounds, normoactive. No distention Skin: C/D/I. No apparent lesions.     GU: Vulvovaginal exam significant for some thick, white yeast appearing material around the clitoral hood but not extending into the vaginal introitus.  Continuous urine leak with inflamed, red skin on the inferior vaginal introitus from constant irritation.  MSK:      No apparent deformity.      Tender to palpation throughout bilateral proximal lower extremities, lower abdomen/suprapubic area bilaterally.  No deformity.        Cognition: AAO to person only; not place, time, or event.--Unchanged Language: Fluent, but hypophonic and with occasional perseveration.  Ongoing Memory: Severe deficits.  Ongoing Insight: Poor  insight into current condition.  Ongoing Mood: Pleasant affect,  appropriate mood.  Sensation: To light touch intact in BL UEs and LEs  Reflexes: 2+ in BL UE and LEs. Negative Hoffman's and babinski signs bilaterally.  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 Spasticity: MAS 0 in all extremities.       Strength:                  RUE: 5/5 SA, 5/5 EF, 5/5 EE, 5/5 WE, 5/5 FF, 5/5 FA                LUE:  3/5 SA, 4/5 EF, 4/5 EE, 4/5 WE, 4/5 FF, 4/5 FA                RLE: 3/5 HF, 3/5 KE, 4/5  DF, 4/5  EHL, 4/5  PF --complicated by pain with range of motion                LLE:  3/5 HF, 3/5 KE, 4/5  DF, 4/5  EHL, 4/5  PF       Assessment/Plan: 1. Functional deficits which require 3+ hours per day of interdisciplinary therapy in a comprehensive inpatient rehab setting. Physiatrist is providing close team supervision and 24 hour management of active medical problems listed below. Physiatrist and rehab team continue to assess barriers to discharge/monitor patient progress toward functional and medical goals  Care Tool:  Bathing              Bathing assist Assist Level: 2 Helpers     Upper Body Dressing/Undressing Upper body dressing        Upper body assist Assist Level: Total Assistance - Patient < 25%    Lower Body Dressing/Undressing Lower body dressing      What is the patient wearing?: Pants, Incontinence brief     Lower body assist Assist for lower body dressing: 2 Helpers     Toileting Toileting    Toileting assist Assist for toileting: 2 Helpers     Transfers Chair/bed transfer  Transfers assist  Chair/bed transfer activity did not occur: Safety/medical concerns  Chair/bed transfer assist level: 2 Helpers     Locomotion Ambulation   Ambulation assist      Assist level: 2 helpers (+3 with WC follow) Assistive device:  (3 musketeers) Max distance: 4'   Walk 10 feet activity   Assist  Walk 10 feet activity did not occur:  Safety/medical concerns        Walk 50 feet activity   Assist Walk 50 feet with 2 turns activity did not occur: Safety/medical concerns         Walk 150 feet activity   Assist Walk 150 feet activity did not occur: Safety/medical concerns         Walk 10 feet on uneven surface  activity   Assist Walk 10 feet on uneven surfaces activity did not occur: Safety/medical concerns         Wheelchair     Assist Is the patient using a wheelchair?: Yes      Wheelchair assist level: Dependent - Patient 0% Max wheelchair distance: 150'    Wheelchair 50 feet with 2 turns activity    Assist        Assist Level: Dependent - Patient 0%   Wheelchair 150 feet activity     Assist      Assist Level: Dependent - Patient 0%  Blood pressure (!) 123/55, pulse 84, temperature 98.5 F (36.9 C), resp. rate 17, height 5' 8 (1.727 m), weight 71.2 kg, SpO2 97%.  Medical Problem List and Plan: 1. 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 C/b incontinence. Max  to total A all ADLs. Max A PT with STS; Total A for ambulation. Goals are for Max A +1 assist for SNF discharge. On Dys 2 with honey thick liquids. Profound cog-linguistic deficits. Not a candidate for frasier free water  protocol due to lethargy.   2.  Antithrombotics: -DVT/anticoagulation:  Pharmaceutical: Lovenox              -antiplatelet therapy: ASA/Plavis resumed 09/10.  3. Pain Management: tylenol  prn.    - 9/19: Right thigh pain with new findings of superior pubic rami fracture below; scheduled Tylenol  1000 mg 3 times daily, add as needed oxycodone  2.5 mg every 8 hours as needed for severe pain  - 9/20 inconsistent reports  of pain, may be associated with cognition deficits.  Does not look uncomfortable and was noted to be sleeping earlier  - 9/21, patient indicates discomfort with palpation throughout her body, throughout arms and legs, clavicles, and abdomen, head.  Nursing documented 0 10 earlier this morning.  9-22: Ongoing suprapubic pain due to fracture.  Ongoing.  Hesitant to schedule oxycodone  given cognitive deficits, will apply aqua thermia heat pack to abdomen as needed for extra pain control  4. Mood/Behavior/Sleep: LCSW to follow for evaluation and support.              -antipsychotic agents: Seroquel   - 9-18: Sleeping well, mood well-controlled.  Start sleep log.--Sleeping appropriately.  5. Neuropsych/cognition/mixed dementia: This patient is not capable of making decisions on her own behalf.  - Per patient's wife, managed by Dr. Rosemarie as outpatient  - On Aricept  10 mg nightly, resumed while in hospital; resume here 9-18  - 9-22: No notable improvements in cognition since admission to inpatient rehab.  6. Skin/Wound Care: Routine pressure relief measures.  7. Fluids/Electrolytes/Nutrition: Strict I/O. Check CMET in am   - 9/18: Add K CL 40 meq daily x3 days  9-23: Very poor p.o. intakes.  Start Megace  200 mg twice daily.  8. Aspiration PNA: Completed course of Rocephin . Started on Augmentin  09/17 for increase in Right base opacity. .             --WBC trending up 12.8-->15.2-->18.7-->20.4. Steroid effect?              --Recheck CXR in am -no consolidations or effusions   - 9/18: WBC downtrending 13.5; unfortunately did aspirate this a.m.  Discussed with pharmacy, should be covered by Augmentin , complete course.  9-22: WBC continues to downtrend, 12.2  9. ST elevation/ Acute sytolic CHF: Takotsubo v/s LAD disease. Strict I/O. Daily weights --Weight 167 at admission -->165 on 09/17.  Weight stable 9/21 weight trending down, will decrease Lasix  to daily from twice daily 9-22: Weight  stable. Filed Weights   05/24/24 0638 05/25/24 0515 05/26/24 0503  Weight: 67.6 kg 67.7 kg 71.2  kg    10. Acute renal failure: BUN/SCr on steady rise with IV lasix  started on 09/16             --Recheck labs in am and may need to back off of worsening- -improving 1.8->1.4; stabilizing around 1.4  11. Hypernatremia: Likely due to poor intake.  --Recheck BMET in am--resolved  12. T2DM: Hgb A1c-8.4. Monitor BS ac/hs and use SSI for elevated BS             --continue lantus  8 units BID-->may need to adjust as off tube feeds.    - -Relatively well-controlled on current regimen, trend  - 9/20-22 fair control continue current regimen and monitor Recent Labs    05/25/24 1637 05/25/24 2118 05/26/24 0639  GLUCAP 129* 150* 124*     13.  Dysphagia: continue D2, nectar liquids--strict enforcement and supervision   14. LUE edema: Question due to IV infiltration v/s DVT. Dopplers ordered   - Edema reduced, Dopplers pending--not yet performed 9-19, will repeat.  - 9/21 age indeterminate superficial vein thrombosis involving the left cephalic vein--treat conservatively with heat, compression  15. New onset seizures: continue Keppra .     - 9/19: Notable left upper extremity continuous tremor on exam; patient responsive, mild tremor in right upper extremity, per patient's wife this has been ongoing.  Continue Keppra  current dose.  9-22: No further tremor noted   18.  Right thigh pain.  Present since at least 9-8; reported 9-19. x-ray hip and knee ordered for today.  - X-ray concerning for occult fracture of right superior pubic rami - Discussed with Dr. Montine, appears stable, placed weightbearing as tolerated for now, can consider CT if pain unable to control -orders placed  19.  Constipation.  LBM 9/16 recorded.  - X-ray abdomen to check stool burden- normal, will order 34 g of MiraLAX - thickend  -LBM 9/23, large  20. Dysuria/urinary retention  - 9/21 order PVRs and check UA/urine  culture--negative UA, elevated PVRs, start timed toiletting  9-23: Pain ongoing, exam today significant for yeast appearing around the clitoral hood and moisture associated skin irritation in the posterior vaginal introitus from urine leak.  Schedule nystatin  powder twice daily, added Desitin ointment as needed, discussed with nursing.  21.  Anemia.  Worsening from 12.3 on admission to 10.7.  - FOBT pending  - Iron studies show elevated ferritin, normal TIBC and iron level.  - Trend with repeat labs Thursday.  No obvious sources of bleeding.  9-23: FOBT positive, start Protonix  40 mg twice daily AC and add Anusol  suppository twice daily as needed.  Labs Thursday.  LOS: 6 days A FACE TO FACE EVALUATION WAS PERFORMED  Joesph JAYSON Likes 05/26/2024, 9:21 AM

## 2024-05-26 NOTE — Plan of Care (Signed)
  Problem: Consults Goal: RH BRAIN INJURY PATIENT EDUCATION Description: Description: See Patient Education module for eduction specifics Outcome: Progressing Goal: Diabetes Guidelines if Diabetic/Glucose > 140 Description: If diabetic or lab glucose is > 140 mg/dl - Initiate Diabetes/Hyperglycemia Guidelines & Document Interventions  Outcome: Progressing   Problem: RH BOWEL ELIMINATION Goal: RH STG MANAGE BOWEL WITH ASSISTANCE Description: STG Manage Bowel with Assistance. Outcome: Progressing   Problem: RH BLADDER ELIMINATION Goal: RH STG MANAGE BLADDER WITH ASSISTANCE Description: STG Manage Bladder With minimal Assistance Outcome: Progressing   Problem: RH SKIN INTEGRITY Goal: RH STG SKIN FREE OF INFECTION/BREAKDOWN Description: Manage skin integrity with minimal assistance Outcome: Progressing   Problem: RH SAFETY Goal: RH STG ADHERE TO SAFETY PRECAUTIONS W/ASSISTANCE/DEVICE Description: STG Adhere to Safety Precautions With minimal  Assistance Device. Outcome: Progressing   Problem: RH PAIN MANAGEMENT Goal: RH STG PAIN MANAGED AT OR BELOW PT'S PAIN GOAL Description: <4 w/prns Outcome: Progressing   Problem: RH KNOWLEDGE DEFICIT BRAIN INJURY Goal: RH STG INCREASE KNOWLEDGE OF SELF CARE AFTER BRAIN INJURY Description: Manage increase knowledge of self care after brain injury with minimal assistance from spouse using educational materials provided Outcome: Progressing Goal: RH STG INCREASE KNOWLEDGE OF DYSPHAGIA/FLUID INTAKE Description: Manage increase knowledge of dysphagia/ fluid intake  with minimal assistance from spouse using educational materials provided Outcome: Progressing   Problem: RH Vision Goal: RH LTG Vision (Specify) Outcome: Progressing   Problem: Education: Goal: Ability to describe self-care measures that may prevent or decrease complications (Diabetes Survival Skills Education) will improve Outcome: Progressing Goal: Individualized Educational  Video(s) Outcome: Progressing   Problem: Coping: Goal: Ability to adjust to condition or change in health will improve Outcome: Progressing   Problem: Fluid Volume: Goal: Ability to maintain a balanced intake and output will improve Outcome: Progressing   Problem: Health Behavior/Discharge Planning: Goal: Ability to identify and utilize available resources and services will improve Outcome: Progressing Goal: Ability to manage health-related needs will improve Outcome: Progressing   Problem: Metabolic: Goal: Ability to maintain appropriate glucose levels will improve Outcome: Progressing   Problem: Nutritional: Goal: Maintenance of adequate nutrition will improve Outcome: Progressing Goal: Progress toward achieving an optimal weight will improve Outcome: Progressing   Problem: Skin Integrity: Goal: Risk for impaired skin integrity will decrease Outcome: Progressing   Problem: Tissue Perfusion: Goal: Adequacy of tissue perfusion will improve Outcome: Progressing

## 2024-05-27 DIAGNOSIS — R1310 Dysphagia, unspecified: Secondary | ICD-10-CM

## 2024-05-27 DIAGNOSIS — S329XXA Fracture of unspecified parts of lumbosacral spine and pelvis, initial encounter for closed fracture: Secondary | ICD-10-CM | POA: Insufficient documentation

## 2024-05-27 DIAGNOSIS — B3731 Acute candidiasis of vulva and vagina: Secondary | ICD-10-CM | POA: Insufficient documentation

## 2024-05-27 DIAGNOSIS — J690 Pneumonitis due to inhalation of food and vomit: Secondary | ICD-10-CM | POA: Insufficient documentation

## 2024-05-27 LAB — GLUCOSE, CAPILLARY
Glucose-Capillary: 121 mg/dL — ABNORMAL HIGH (ref 70–99)
Glucose-Capillary: 144 mg/dL — ABNORMAL HIGH (ref 70–99)
Glucose-Capillary: 138 mg/dL — ABNORMAL HIGH (ref 70–99)
Glucose-Capillary: 146 mg/dL — ABNORMAL HIGH (ref 70–99)

## 2024-05-27 MED ORDER — INSULIN ASPART 100 UNIT/ML IJ SOLN: 0.0000 [IU] | Freq: Every day | INTRAMUSCULAR | Status: DC

## 2024-05-27 MED ORDER — HYDROCORTISONE ACETATE 25 MG RE SUPP: 25.0000 mg | Freq: Two times a day (BID) | RECTAL | Status: AC | PRN

## 2024-05-27 MED ORDER — POLYETHYLENE GLYCOL 3350 17 G PO PACK: 34.0000 g | PACK | Freq: Every day | ORAL | Status: AC

## 2024-05-27 MED ORDER — KATE FARMS STANDARD 1.4 EN LIQD: 325.0000 mL | Freq: Two times a day (BID) | ENTERAL | Status: AC

## 2024-05-27 MED ORDER — MEGESTROL ACETATE 400 MG/10ML PO SUSP: 200.0000 mg | Freq: Two times a day (BID) | ORAL | Status: DC

## 2024-05-27 MED ORDER — ZINC OXIDE 12.8 % EX OINT: TOPICAL_OINTMENT | CUTANEOUS | Status: AC | PRN

## 2024-05-27 MED ORDER — TAMSULOSIN HCL 0.4 MG PO CAPS: 0.4000 mg | ORAL_CAPSULE | Freq: Every day | ORAL | Status: AC

## 2024-05-27 MED ORDER — MELATONIN 5 MG PO TABS: 5.0000 mg | ORAL_TABLET | Freq: Every evening | ORAL | Status: AC | PRN

## 2024-05-27 MED ORDER — FUROSEMIDE 40 MG PO TABS: 40.0000 mg | ORAL_TABLET | Freq: Every day | ORAL | Status: AC

## 2024-05-27 MED ORDER — NYSTATIN 100000 UNIT/GM EX POWD: Freq: Two times a day (BID) | CUTANEOUS | Status: AC

## 2024-05-27 MED ORDER — MEGESTROL ACETATE 400 MG/10ML PO SUSP
200.0000 mg | Freq: Two times a day (BID) | ORAL | Status: DC
  Administered 2024-05-27 – 2024-05-28 (×2): 200 mg via ORAL
  Filled 2024-05-27 (×2): qty 10

## 2024-05-27 MED ORDER — ADULT MULTIVITAMIN W/MINERALS CH: 1.0000 | ORAL_TABLET | Freq: Every day | ORAL | Status: AC

## 2024-05-27 MED ORDER — OXYCODONE HCL 5 MG PO TABS: 2.5000 mg | ORAL_TABLET | Freq: Three times a day (TID) | ORAL | Status: DC | PRN

## 2024-05-27 MED ORDER — PANTOPRAZOLE SODIUM 40 MG PO TBEC: 40.0000 mg | DELAYED_RELEASE_TABLET | Freq: Two times a day (BID) | ORAL | Status: DC

## 2024-05-27 MED ORDER — FAMOTIDINE 20 MG PO TABS
20.0000 mg | ORAL_TABLET | Freq: Two times a day (BID) | ORAL | Status: DC
  Administered 2024-05-27 – 2024-05-28 (×2): 20 mg via ORAL
  Filled 2024-05-27 (×2): qty 1

## 2024-05-27 MED ORDER — ACETAMINOPHEN 500 MG PO TABS: 1000.0000 mg | ORAL_TABLET | Freq: Three times a day (TID) | ORAL | Status: AC

## 2024-05-27 MED ORDER — NYSTATIN 100000 UNIT/GM EX POWD: Freq: Two times a day (BID) | CUTANEOUS | Status: DC

## 2024-05-27 MED ORDER — TAMSULOSIN HCL 0.4 MG PO CAPS
0.4000 mg | ORAL_CAPSULE | Freq: Every day | ORAL | Status: DC
  Administered 2024-05-27: 0.4 mg via ORAL
  Filled 2024-05-27: qty 1

## 2024-05-27 MED ORDER — MEGESTROL ACETATE 400 MG/10ML PO SUSP: 200.0000 mg | Freq: Two times a day (BID) | ORAL | Status: AC

## 2024-05-27 MED ORDER — FAMOTIDINE 20 MG PO TABS: 20.0000 mg | ORAL_TABLET | Freq: Two times a day (BID) | ORAL | Status: AC

## 2024-05-27 NOTE — Progress Notes (Signed)
 PROGRESS NOTE   Subjective/Complaints:  No events ovenright.  Patient initially complaining of ongoing bilateral lower extremity and pelvic pain.  Wife and other family at bedside, all questions answered. ISC for 400 this AM; adding flomax  POs 25%  ROS: Denies fevers, chills, N/V,  constipation, diarrhea, SOB, cough, chest pain, new weakness or paraesthesias.   Dysuria--ongoing Right hip pain--ongoing  Objective:   No results found.  Recent Labs    05/25/24 0523  WBC 12.2*  HGB 10.7*  HCT 32.4*  PLT 343    Recent Labs    05/25/24 0523  NA 140  K 4.0  CL 98  CO2 33*  GLUCOSE 111*  BUN 33*  CREATININE 1.40*  CALCIUM  8.3*     Intake/Output Summary (Last 24 hours) at 05/27/2024 0817 Last data filed at 05/27/2024 9371 Gross per 24 hour  Intake 183 ml  Output 400 ml  Net -217 ml        Physical Exam: Vital Signs Blood pressure 117/60, pulse 79, temperature (!) 97.5 F (36.4 C), resp. rate 18, height 5' 8 (1.727 m), weight 70.2 kg, SpO2 99%.  Constitutional: No apparent distress. Appropriate appearance for age.  Laying in bed. HENT: No JVD. Neck Supple. Trachea midline.  Eyes:  PERRLA. EOMI. Visual fields grossly intact.  Cardiovascular: RRR, no murmurs/rub/gallops.  1+ left upper extremity edema. Peripheral pulses 2+  Respiratory: CTAB. No rales, rhonchi, or wheezing. On RA.   Abdomen: + bowel sounds, normoactive. No distention Skin: C/D/I. No apparent lesions.     GU: Vulvovaginal exam significant for some thick, white yeast appearing material around the clitoral hood but not extending into the vaginal introitus.  Continuous urine leak with inflamed, red skin on the inferior vaginal introitus from constant irritation.  MSK:      No apparent deformity.      Tender to palpation throughout bilateral proximal lower extremities, lower abdomen/suprapubic area bilaterally.  No deformity.--unchanged         Cognition: AAO to person only; not place, time, or event.--Unchanged Language: Fluent, but hypophonic and with occasional perseveration.  Ongoing Memory: Severe deficits.  Ongoing Insight: Poor insight into current condition.  Ongoing Mood: Pleasant affect, appropriate mood.  Sensation: To light touch intact in BL UEs and LEs  Reflexes: 2+ in BL UE and LEs. Negative Hoffman's and babinski signs bilaterally.  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 Spasticity: MAS 0 in all extremities.       Strength:                  RUE: 5/5 SA, 5/5 EF, 5/5 EE, 5/5 WE, 5/5 FF, 5/5 FA                LUE:  3/5 SA, 4/5 EF, 4/5 EE, 4/5 WE, 4/5 FF, 4/5 FA                RLE: 3/5 HF, 3/5 KE, 4/5  DF, 4/5  EHL, 4/5  PF --complicated by pain with range of motion                LLE:  4/5 HF, 4/5 KE, 4/5  DF, 4/5  EHL, 4/5  PF    Physical exam unchanged from the above on reexamination 05/27/24     Assessment/Plan: 1. Functional deficits which require 3+ hours per day of interdisciplinary therapy in a comprehensive inpatient rehab setting. Physiatrist is providing close team supervision and 24 hour management of active medical problems listed below. Physiatrist and rehab team continue to assess barriers to discharge/monitor patient progress toward functional and medical goals  Care Tool:  Bathing              Bathing assist Assist Level: 2 Helpers     Upper Body Dressing/Undressing Upper body dressing        Upper body assist Assist Level: Total Assistance - Patient < 25%    Lower Body Dressing/Undressing Lower body dressing      What is the patient wearing?: Pants, Incontinence brief     Lower body assist Assist for lower body dressing: 2 Helpers     Toileting Toileting Toileting Activity did not occur (Clothing management and hygiene only): N/A (no void or bm)  Toileting assist Assist for  toileting: 2 Helpers     Transfers Chair/bed transfer  Transfers assist  Chair/bed transfer activity did not occur: Safety/medical concerns  Chair/bed transfer assist level: 2 Helpers     Locomotion Ambulation   Ambulation assist      Assist level: 2 helpers (+3 with WC follow) Assistive device:  (3 musketeers) Max distance: 4'   Walk 10 feet activity   Assist  Walk 10 feet activity did not occur: Safety/medical concerns        Walk 50 feet activity   Assist Walk 50 feet with 2 turns activity did not occur: Safety/medical concerns         Walk 150 feet activity   Assist Walk 150 feet activity did not occur: Safety/medical concerns         Walk 10 feet on uneven surface  activity   Assist Walk 10 feet on uneven surfaces activity did not occur: Safety/medical concerns         Wheelchair     Assist Is the patient using a wheelchair?: Yes      Wheelchair assist level: Dependent - Patient 0% Max wheelchair distance: 150'    Wheelchair 50 feet with 2 turns activity    Assist        Assist Level: Dependent - Patient 0%   Wheelchair 150 feet activity     Assist      Assist Level: Dependent - Patient 0%   Blood pressure 117/60, pulse 79, temperature (!) 97.5 F (36.4 C), resp. rate 18, height 5' 8 (1.727 m), weight 70.2 kg, SpO2 99%.  Medical Problem List and Plan: 1. 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 C/b incontinence. Max  to total A all ADLs. Max A PT with STS; Total A for ambulation. Goals are for Max A +1 assist for SNF discharge. On Dys 2 with honey thick liquids. Profound cog-linguistic deficits. Not a  candidate for frasier free  water  protocol due to lethargy.   The patient is medically ready for discharge to SNF and will not need follow-up with [facility]. In addition, they will need to follow up with their PCP, Neurology.   2.  Antithrombotics: -DVT/anticoagulation:  Pharmaceutical: Lovenox              -antiplatelet therapy: ASA/Plavis resumed 09/10.  3. Pain Management: tylenol  prn.    - 9/19: Right thigh pain with new findings of superior pubic rami fracture below; scheduled Tylenol  1000 mg 3 times daily, add as needed oxycodone  2.5 mg every 8 hours as needed for severe pain  - 9/20 inconsistent reports of pain, may be associated with cognition deficits.  Does not look uncomfortable and was noted to be sleeping earlier  - 9/21, patient indicates discomfort with palpation throughout her body, throughout arms and legs, clavicles, and abdomen, head.  Nursing documented 0 10 earlier this morning.  9-22: Ongoing suprapubic pain due to fracture.  Ongoing.  Hesitant to schedule oxycodone  given cognitive deficits, will apply aqua thermia heat pack to abdomen as needed for extra pain control  4. Mood/Behavior/Sleep: LCSW to follow for evaluation and support.              -antipsychotic agents: Seroquel   - 9-18: Sleeping well, mood well-controlled.  Start sleep log.--Sleeping appropriately.  5. Neuropsych/cognition/mixed dementia: This patient is not capable of making decisions on her own behalf.  - Per patient's wife, managed by Dr. Rosemarie as outpatient  - On Aricept  10 mg nightly, resumed while in hospital; resume here 9-18  - 9-22: No notable improvements in cognition since admission to inpatient rehab.  6. Skin/Wound Care: Routine pressure relief measures.  7. Fluids/Electrolytes/Nutrition: Strict I/O. Check CMET in am   - 9/18: Add K CL 40 meq daily x3 days  9-23: Very poor p.o. intakes.  Start Megace  200 mg twice daily.  8. Aspiration PNA: Completed course of Rocephin . Started on Augmentin  09/17 for increase in Right  base opacity. .             --WBC trending up 12.8-->15.2-->18.7-->20.4. Steroid effect?              --Recheck CXR in am -no consolidations or effusions   - 9/18: WBC downtrending 13.5; unfortunately did aspirate this a.m.  Discussed with pharmacy, should be covered by Augmentin , complete course.  9-22: WBC continues to downtrend, 12.2  9. ST elevation/ Acute sytolic CHF: Takotsubo v/s LAD disease. Strict I/O. Daily weights --Weight 167 at admission -->165 on 09/17.  Weight stable 9/21 weight trending down, will decrease Lasix  to daily from twice daily 9-22: Weight stable. Filed Weights   05/26/24 0503 05/27/24 0127 05/27/24 0548  Weight: 71.2 kg 71.2 kg 70.2 kg    10. Acute renal failure: BUN/SCr on steady rise with IV lasix  started on 09/16             --Recheck labs in am and may need to back off of worsening- -improving 1.8->1.4; stabilizing around 1.4  11. Hypernatremia: Likely due to poor intake.  --Recheck BMET in am--resolved  12. T2DM: Hgb A1c-8.4. Monitor BS ac/hs and use SSI for elevated BS             --continue lantus  8 units BID-->may need to adjust as off tube feeds.    - -Relatively well-controlled on current regimen, trend  - 9/20-22 fair control continue current regimen and monitor Recent Labs    05/26/24 1652  05/26/24 2101 05/27/24 0608  GLUCAP 169* 158* 121*     13.  Dysphagia: continue D2, nectar liquids--strict enforcement and supervision   14. LUE edema: Question due to IV infiltration v/s DVT. Dopplers ordered   - Edema reduced, Dopplers pending--not yet performed 9-19, will repeat.  - 9/21 age indeterminate superficial vein thrombosis involving the left cephalic vein--treat conservatively with heat, compression  15. New onset seizures: continue Keppra .     - 9/19: Notable left upper extremity continuous tremor on exam; patient responsive, mild tremor in right upper extremity, per patient's wife this has been ongoing.  Continue Keppra  current  dose.  9-22: No further tremor noted   18.  Right thigh pain.  Present since at least 9-8; reported 9-19. x-ray hip and knee ordered for today.  - X-ray concerning for occult fracture of right superior pubic rami - Discussed with Dr. Montine, appears stable, placed weightbearing as tolerated for now, can consider CT if pain unable to control -orders placed  19.  Constipation.  LBM 9/16 recorded.  - X-ray abdomen to check stool burden- normal, will order 34 g of MiraLAX - thickend  -LBM 9/23, large  20. Dysuria/urinary retention  - 9/21 order PVRs and check UA/urine culture--negative UA, elevated PVRs, start timed toiletting  9-23: Pain ongoing, exam today significant for yeast appearing around the clitoral hood and moisture associated skin irritation in the posterior vaginal introitus from urine leak.  Schedule nystatin  powder twice daily, added Desitin ointment as needed, discussed with nursing.  9-24: Rare need for intermittent straight cath, with volumes about 400.  Start Flomax  0.4 mg nightly.  May be secondary to pubic fracture; no Foley as she can generally void on her own..  21.  Anemia.  Worsening from 12.3 on admission to 10.7.  - FOBT pending  - Iron studies show elevated ferritin, normal TIBC and iron level.  - Trend with repeat labs Thursday.  No obvious sources of bleeding.  9-23: FOBT positive, start Protonix  40 mg twice daily AC and add Anusol  suppository twice daily as needed.  9-24: No obvious sources of bleeding.switch from Protonix  to Pepcid  on discharge due to need to crush.  Continue Plavix .  LOS: 7 days A FACE TO FACE EVALUATION WAS PERFORMED  Joesph JAYSON Likes 05/27/2024, 8:17 AM

## 2024-05-27 NOTE — Progress Notes (Signed)
 Physical Therapy Session Note  Patient Details  Name: Beth Macdonald MRN: [id number] Date of Birth: 07-14-1946  Today's Date: 05/27/2024 PT Individual Time: 9054-8954 PT Individual Time Calculation (min): 60 min   Short Term Goals: Week 1:  PT Short Term Goal 1 (Week 1): Pt will complete bed mobility with maxA +1. PT Short Term Goal 2 (Week 1): Pt will complete bed to chair transfer with maxA +1. PT Short Term Goal 3 (Week 1): Pt will complete static sitting balance for ~5 minutes with CGA.  Skilled Therapeutic Interventions/Progress Updates:   Pt received supine in bed, agreeable to therapy. Pt initially w/ no c/o pain. Pt required modA for bed mobility and increased time, as pt reported significant pain w/ BLE movement. Pt required multiple attempts to perform sit to stand from EOB due to increased pain w/ standing. Pt performed stand pivot transfer w/ modA into TIS WC. Pt wheeled down to main gym for therapeutic exercises targeting pre-gait movements including seated marching w/ resistance, LAQ. Pt w/ increased c/o pain w/ movement.   Pt educated on pain at length throughout session, given tactics and techniques to alleviate her response to pain. Pt expressed understanding but was unsuccessful at mitigating pain response. Pt visibly frustrated with current situation, was assured and validated in her emotions. Pt wheeled to sky bridge for natural light to improve pt affect/mood w/ limited success. Attempted core training w/ abdominal crunches in chair to improve functional activity tolerance and decrease caregiver burden w/ limited success as movement increased pain. Pt given multiple rest breaks throughout session for pain to subside, but rest didn't always coincide w/ decrease in pain.   At end of session, pt wheeled back to room and given call bell and all other needs in reach. Pt remained upright in WC.   Therapy Documentation Precautions:  Precautions Precautions: Fall, Other  (comment) Recall of Precautions/Restrictions: Impaired Precaution/Restrictions Comments: L inattention Restrictions Weight Bearing Restrictions Per Provider Order: No    Therapy/Group: Individual Therapy  Oneil Grumbles 05/27/2024, 1:16 PM

## 2024-05-27 NOTE — Progress Notes (Signed)
 Speech Language Pathology Daily Session Note  Patient Details  Name: MALLARY KREGER MRN: [id number] Date of Birth: September 02, 1946  Today's Date: 05/27/2024 SLP Individual Time: 0800-0900 SLP Individual Time Calculation (min): 60 min  Short Term Goals: Week 1: SLP Short Term Goal 1 (Week 1): Pt will tolerate trials of nectar thick liquids w/ no overt s/s of airway invasion 80% of the time or greater SLP Short Term Goal 2 (Week 1): Pt will tolerate trials of thin liquids w/ no overt s/s of airway invasion 60% of the time or greater SLP Short Term Goal 3 (Week 1): Pt will solve functional problems w/ maxA SLP Short Term Goal 4 (Week 1): Pt will complete functional naming tasks w/ maxA SLP Short Term Goal 5 (Week 1): Pt will utilize visual aids as needed to provide orientation information w/ maxA  Skilled Therapeutic Interventions:   Pt greeted at bedside for tx targeting cognition, communication, and dysphagia. She was awake upon SLP arrival and moaning re stomach pain. During Y/N questions re pain, pt responded yes to hunger. SLP assisted pt w/ repositioning and set up of morning meal for PO intake. Pt demonstrated a positive response to SLP placing the spoon in her hand, as she then initiated scooping and bringing the food to her mouth. She was able to self feed for 5-8 mins and then benefited from modA cues to continue. Cough x1 noted throughout. SLP then assisted pt w/ oral care via suction. She required maxA for thoroughness. Attempted ice chip trials, however, pt was perseverating on her inability to swallow. While chewing and swallowing, pt continuously stated  I can't help me help me swallow. Despite constant cueing, she was unable to be redirected. Immediate cough noted during 4/5 trials. Do anticipate talking throughout trials negatively impacted timing of swallow initiation and airway protection. Trials discontinued d/t pt anxiety. Direct handoff completed w/ nursing. Recommend cont ST  per POC.   Pain Pain Assessment Pain Scale: Faces Faces Pain Scale: Hurts little more Pain Location: Generalized Pain Intervention(s): Medication (See eMAR)  Therapy/Group: Individual Therapy  [name] 05/27/2024, 1:57 PM

## 2024-05-27 NOTE — Progress Notes (Signed)
 Physical Therapy Discharge Summary  Patient Details  Name: Beth Macdonald MRN: [id number] Date of Birth: 07/22/1946  Date of Discharge from PT service:May 27, 2024  Patient has met 5 of 5 long term goals due to improved activity tolerance, improved balance, improved postural control, increased strength, improved attention, improved awareness, and improved coordination.  Patient to discharge at a wheelchair level dependent.   Patient's care partner requires assistance to provide the necessary physical and cognitive assistance at discharge.  Reasons goals not met: NA  Recommendation:  Patient will benefit from ongoing skilled PT services in skilled nursing facility setting to continue to advance safe functional mobility, address ongoing impairments in balance, , and minimize fall risk.  Equipment: No equipment provided  Reasons for discharge: discharge from hospital  Patient/family agrees with progress made and goals achieved: Yes  PT Discharge Precautions/Restrictions Precautions Precautions: Fall Recall of Precautions/Restrictions: Impaired Precaution/Restrictions Comments: L inattention Restrictions Weight Bearing Restrictions Per Provider Order: No Pain Interference Pain Interference Pain Effect on Sleep: 8. Unable to answer Pain Interference with Therapy Activities: 8. Unable to answer Pain Interference with Day-to-Day Activities: 8. Unable to answer Vision/Perception  Vision - History Ability to See in Adequate Light: 1 Impaired Vision - Assessment Eye Alignment: Within Functional Limits Ocular Range of Motion: Within Functional Limits Alignment/Gaze Preference: Within Defined Limits Additional Comments: Nystagmus at rest. Perception Perception: Impaired Preception Impairment Details: Inattention/Neglect Praxis Praxis: Impaired Praxis Impairment Details: Ideomotor;Motor planning  Cognition Overall Cognitive Status: History of cognitive impairments - at  baseline Arousal/Alertness: Awake/alert Attention: Selective Selective Attention: Impaired Selective Attention Impairment: Verbal basic;Functional basic Memory: Impaired Memory Impairment: Decreased short term memory Decreased Short Term Memory: Verbal basic;Functional basic Awareness: Impaired Awareness Impairment: Intellectual impairment Problem Solving: Impaired Problem Solving Impairment: Functional basic;Verbal basic Safety/Judgment: Impaired Sensation Sensation Light Touch: Impaired Detail Light Touch Impaired Details: Impaired LUE Proprioception: Impaired Detail Proprioception Impaired Details: Impaired LUE;Impaired LLE Coordination Gross Motor Movements are Fluid and Coordinated: No Fine Motor Movements are Fluid and Coordinated: No Coordination and Movement Description: generalized deconditioning and limited by pain and cognition Motor  Motor Motor: Hemiplegia;Abnormal postural alignment and control  Mobility Bed Mobility Bed Mobility: Sit to Supine;Supine to Sit Supine to Sit: Maximal Assistance - Patient - Patient 25-49% Sit to Supine: Maximal Assistance - Patient 25-49% Transfers Transfers: Stand to Sit;Sit to Stand;Stand Pivot Transfers Sit to Stand: Maximal Assistance - Patient 25-49% Stand to Sit: Maximal Assistance - Patient 25-49% Stand Pivot Transfers: Maximal Assistance - Patient 25 - 49% Locomotion  Gait Ambulation: No Gait Gait: No Stairs / Additional Locomotion Stairs: No Wheelchair Mobility Wheelchair Mobility: Yes Wheelchair Assistance: Dependent - Patient 0% Wheelchair Parts Management: Needs assistance Distance: 150'  Trunk/Postural Assessment  Cervical Assessment Cervical Assessment: Exceptions to [facility] (forward head) Thoracic Assessment Thoracic Assessment: Exceptions to [facility] (rounded shoulders) Lumbar Assessment Lumbar Assessment: Exceptions to [facility] (posterior pelvic tilt) Postural Control Postural Control: Deficits on  evaluation Righting Reactions: delayed Protective Responses: delayed  Balance Balance Balance Assessed: Yes Static Sitting Balance Static Sitting - Balance Support: Feet supported Static Sitting - Level of Assistance: 5: Stand by assistance (CGA) Dynamic Sitting Balance Dynamic Sitting - Balance Support: Bilateral upper extremity supported Dynamic Sitting - Level of Assistance: 4: Min assist Static Standing Balance Static Standing - Balance Support: During functional activity;Bilateral upper extremity supported Static Standing - Level of Assistance: 2: Max assist Dynamic Standing Balance Dynamic Standing - Balance Support: During functional activity;Bilateral upper extremity supported Dynamic Standing - Level of Assistance: 2:  Max assist Extremity Assessment  RUE Assessment RUE Assessment: Within Functional Limits LUE Assessment LUE Assessment: Exceptions to [facility] Active Range of Motion (AROM) Comments: 0-70degrees shoulder flexion General Strength Comments: 2+/5 RLE Assessment RLE Assessment: Exceptions to [facility] General Strength Comments: Grossly 2+/5, limited by pain LLE Assessment LLE Assessment: Exceptions to [facility] General Strength Comments: Grossly 2+/5, limited by pain   Elsie JAYSON Dawn, PT, DPT 05/27/2024, 4:28 PM

## 2024-05-27 NOTE — Progress Notes (Signed)
 Patient due to void. Bladder scanned performed at 0600, urine volume was 419. Patient was straight cath, urine volume obtained after catheterization was 400. Post void residual was 0. Patient was oriented about the importance of voiding. Will continue to monitor.

## 2024-05-27 NOTE — Discharge Summary (Addendum)
 Physician Discharge Summary  Patient ID: Beth Macdonald MRN: [id number] DOB/AGE: Apr 18, 1946 78 y.o.  Admit date: 05/20/2024 Discharge date: 05/28/2024  Discharge Diagnoses:  Principal Problem:   Seizure disorder as sequela of cerebrovascular accident [facility]) Active Problems:   Type 2 diabetes mellitus with hyperlipidemia (HCC)   Osteoarthritis of right knee   Essential hypertension   Focal seizures (HCC)   Acute systolic congestive heart failure (HCC)   Pelvic fracture (HCC)   Aspiration pneumonia (HCC)   Dysphagia   Vaginal candidiasis  Hypokalemia  Acute on chronic renal failure.    Discharged Condition: stable  Significant Diagnostic Studies: VAS US  UPPER EXTREMITY VENOUS DUPLEX Result Date: 05/24/2024 UPPER VENOUS STUDY  Patient Name:  Beth Macdonald  Date of Exam:   05/24/2024 Medical Rec #: [id number]          Accession #:    [id number] Date of Birth: 1946-08-16          Patient Gender: F Patient Age:   56 years Exam Location:  [facility] Procedure:      VAS US  UPPER EXTREMITY VENOUS DUPLEX Referring Phys: JOESPH LIKES --------------------------------------------------------------------------------  Indications: Edema, and stroke Comparison Study: No prior exam. Performing Technologist: Edilia Elden Appl  Examination Guidelines: A complete evaluation includes B-mode imaging, spectral Doppler, color Doppler, and power Doppler as needed of all accessible portions of each vessel. Bilateral testing is considered an integral part of a complete examination. Limited examinations for reoccurring indications may be performed as noted.  Right Findings: +----------+------------+---------+-----------+----------+-------+ RIGHT     CompressiblePhasicitySpontaneousPropertiesSummary +----------+------------+---------+-----------+----------+-------+ IJV           Full       Yes       Yes                      +----------+------------+---------+-----------+----------+-------+  Subclavian               Yes       Yes                      +----------+------------+---------+-----------+----------+-------+  Left Findings: +----------+------------+---------+-----------+----------+-------+ LEFT      CompressiblePhasicitySpontaneousPropertiesSummary +----------+------------+---------+-----------+----------+-------+ IJV           Full       Yes       Yes                      +----------+------------+---------+-----------+----------+-------+ Subclavian               Yes       Yes                      +----------+------------+---------+-----------+----------+-------+ Axillary      Full       Yes       Yes                      +----------+------------+---------+-----------+----------+-------+ Brachial      Full       Yes       Yes                      +----------+------------+---------+-----------+----------+-------+ Radial        Full                                          +----------+------------+---------+-----------+----------+-------+  Ulnar         Full                                          +----------+------------+---------+-----------+----------+-------+ Cephalic      None       No        No                       +----------+------------+---------+-----------+----------+-------+ Basilic       Full       Yes       Yes                      +----------+------------+---------+-----------+----------+-------+ Evidence of acute to age indeterminate superficial vein thrombosis noted in the left cephalic vein from the antecubital fossa to the clavicular area, just distal to the without infiltrating the subclavian vein.  Summary:  Right: No evidence of thrombosis in the subclavian.  Left: No evidence of deep vein thrombosis in the upper extremity. Findings consistent with acute superficial vein thrombosis involving the left cephalic vein. Findings consistent with age indeterminate superficial vein thrombosis involving the left  cephalic vein.  *See table(s) above for measurements and observations.  Diagnosing physician: Penne Colorado MD Electronically signed by Penne Colorado MD on 05/24/2024 at 12:03:13 PM.    Final    DG Abd 2 Views Result Date: 05/23/2024 EXAM: 2 VIEW XRAY OF THE ABDOMEN 05/23/2024 07:02:11 PM COMPARISON: None available. CLINICAL HISTORY: Constipation FINDINGS: BOWEL: Normal colonic stool burden. Contrast extending from the cecum to the rectum. Nonobstructive bowel gas pattern. SOFT TISSUES: No opaque urinary calculi. BONES: No acute osseous abnormality. IMPRESSION: 1. Negative. Electronically signed by: Pinkie Pebbles MD 05/23/2024 07:10 PM EDT RP Workstation: [id number]   DG Knee 1-2 Views Right Result Date: 05/22/2024 EXAM: 1 or 2 VIEW(S) XRAY OF THE RIGHT KNEE 05/22/2024 12:11:00 PM COMPARISON: None available. CLINICAL HISTORY: Pain in right knee. Acute right hip pain. FINDINGS: BONES AND JOINTS: Right knee arthroplasty noted. No periprosthetic lucency. No acute or periprosthetic fracture. Trace joint effusion. SOFT TISSUES: The soft tissues are unremarkable. IMPRESSION: 1. Right knee arthroplasty without complication. 2. Trace joint effusion. Electronically signed by: Andrea Gasman MD 05/22/2024 02:43 PM EDT RP Workstation: [id number]   DG HIP UNILAT WITH PELVIS 2-3 VIEWS RIGHT Result Date: 05/22/2024 EXAM: 3 VIEW(S) XRAY OF THE UNILATERAL HIP 05/22/2024 12:11:00 PM COMPARISON: None available. CLINICAL HISTORY: Acute hip pain, right [id number]. Acute right hip pain FINDINGS: BONES AND JOINTS: Subtle periosteal reaction along the medial aspect of the right superior pubic ramus possibly indicating occult fracture. Degenerative changes of the lower lumbar spine. SOFT TISSUES: Residual enteric contrast within the colon. IMPRESSION: 1. Subtle periosteal reaction along the medial right superior pubic ramus, suspicious for occult fracture; correlate with point tenderness and site of pain. Electronically signed by:  Ryan Chess MD 05/22/2024 12:44 PM EDT RP Workstation: [id number]   DG Chest 2 View Result Date: 05/21/2024 EXAM: 2 VIEW(S) XRAY OF THE CHEST 05/21/2024 03:41:00 PM COMPARISON: 05/19/2024 CLINICAL HISTORY: Aspiration into airway FINDINGS: LINES, TUBES AND DEVICES: Left chest cardiac loop recorder. LUNGS AND PLEURA: Improved lung volumes. No significant pleural effusion, interstitial edema or airspace consolidation. No pneumothorax visualized. HEART AND MEDIASTINUM: No acute abnormality of the cardiac and mediastinal silhouettes. BONES AND SOFT TISSUES: No acute osseous abnormality. IMPRESSION: 1. No acute cardiopulmonary process;  no pleural effusion or pneumothorax. 2. Left chest cardiac loop recorder in place. Electronically signed by: Waddell Calk MD 05/21/2024 04:46 PM EDT RP Workstation: [id number]   DG CHEST PORT 1 VIEW Result Date: 05/19/2024 EXAM: 1 VIEW XRAY OF THE CHEST 05/19/2024 01:52:00 PM COMPARISON: 05/14/2024 CLINICAL HISTORY: Hypoxia [id number]. Reason for exam: hypoxia ; Best obtainable due to pts position FINDINGS: LINES, TUBES AND DEVICES: Endotracheal tube, enteric tube and left internal jugular central venous catheter removed. Left chest cardiac loop recorder noted. LUNGS AND PLEURA: Low lung volumes with bronchovascular crowding. Increased right basilar opacity. Small right pleural effusion. HEART AND MEDIASTINUM: No acute abnormality of the cardiac and mediastinal silhouettes. BONES AND SOFT TISSUES: No acute osseous abnormality. IMPRESSION: 1. Increased right basilar opacity and small right pleural effusion. 2. Low lung volumes with bronchovascular crowding. Electronically signed by: Donnice Mania MD 05/19/2024 04:34 PM EDT RP Workstation: [id number]    Labs:  Basic Metabolic Panel:    Latest Ref Rng & Units 05/28/2024    5:36 AM 05/25/2024    5:23 AM 05/21/2024    5:38 AM  BMP  Glucose 70 - 99 mg/dL 876  888  837   BUN 8 - 23 mg/dL 26  33  64   Creatinine 0.44 - 1.00 mg/dL  8.48  8.59  8.51   Sodium 135 - 145 mmol/L 139  140  144   Potassium 3.5 - 5.1 mmol/L 3.4  4.0  3.4   Chloride 98 - 111 mmol/L 103  98  100   CO2 22 - 32 mmol/L 28  33  28   Calcium  8.9 - 10.3 mg/dL 8.3  8.3  8.7      CBC:    Latest Ref Rng & Units 05/28/2024    5:36 AM 05/25/2024    5:23 AM 05/21/2024    5:38 AM  CBC  WBC 4.0 - 10.5 K/uL 8.7  12.2  13.5   Hemoglobin 12.0 - 15.0 g/dL 89.0  89.2  88.5   Hematocrit 36.0 - 46.0 % 32.4  32.4  35.0   Platelets 150 - 400 K/uL 307  343  378      CBG: Recent Labs  Lab 05/27/24 1145 05/27/24 1703 05/27/24 2105 05/28/24 0611 05/28/24 1013  GLUCAP 144* 138* 146* 109* 157*    Recent weights: Filed Weights   05/27/24 0127 05/27/24 0548 05/28/24 0619  Weight: 71.2 kg 70.2 kg 69 kg     Brief HPI:   Karlye Ihrig Marner is a 78 y.o. 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  She had seizure en route to ED and witnessed the death with left facial twitching in ED lasting 30 to 40 seconds.  CT head was negative.  CTA head/neck showed right-MCA M2 branch occlusion with progression of stenosis rather than occlusion per neurology input.  She was not a candidate for TNKase or thrombectomy.  She was loaded with Keppra  and developed hypoxic respiratory failure requiring intubation for airway protection.  MRI brain done revealing abnormal signal mesial right temporal and right hippocampal formation favored to be seizure/postictal changes versus ischemia or encephalitis.  She was placed on long-term EEG which was negative for seizures.  LP done and showed  mononuclear WBCs without organisms and no growth.  CT renal study showed bladder wall  thickening compatible with cystitis.  Seizures felt to be in setting of UTI and stroke.  Respiratory culture was positive and she was treated with 5-day course ceftriaxone .  She continued to be encephalopathic and MRI brain repeated and showed more pronounced T2 flair signal likely due to seizure.  She had recurrent seizure activity on 09/12 requiring Versed  and was treated with IV Solu-Medrol  x 5 days due to concerns of inflammatory etiology.  Long-term EEG showed evidence of epileptogenicity and cortical dysfunction right hemisphere.  Hospital course also significant for ST elevation in V1 and V2 as well as acute systolic..  2D echo showed severe systolic heart failure question due to LAD infarct versus Takotsubo cardiomyopathy but presentation felt to be secondary to ladder.  She was not felt to be cardiac cath candidate.  Mentation slowly improving and she tolerated extubation and started on dysphagia 2 diet with honey liquids.  Dr. Ladona recommended DAPT and spironolactone  added as blood pressure stable.  Follow-up CBC shows recurrent rise in white count with concerns of aspiration therefore Augmentin  added.  Recommendations are to continue IV Lasix  for 1 to 2 days and transition to torsemide.  Patient continues to be limited by left-sided weakness with cognitive deficits.  CIR was recommended to recent decline and to decrease burden of care.    Hospital Course: Mayce Noyes Bove was admitted to rehab 05/20/2024 for inpatient therapies to consist of PT, ST and OT at least three hours five days a week. Past admission physiatrist, therapy team and rehab RN have worked together to provide customized collaborative inpatient rehab. She continues on dysphagia diet with honey thick liquids. Po intake was reported to be poor and megace  was added to stimulate appetite. Follow up CXR showed improvement but concerns of aspiration event on 09/18 and she was treated with a week course of Augmentin , Leucocytosis is  resolving.    Check of BMET showed acute renal failure that is resolving and hypernatremia has resolved.  Weights have been monitored daily and is o downward trend. Lasix  was decreased to once daily. Voiding was monitored with PVR checks and volumes were noted to be boderline. She has had issues with dysuria and found to have candida vaginitis which has been treated with nystatin  powder.Her blood sugars have been monitored with ac/hs CBG checks and SSI was use prn for tighter BS control. Blood sugars have been reasonably controlled on current intake. Recommend supplements  thickened to honey thick consistency and offered between meals to help  maintain nutritional status. Recheck of BMET today shows recurrent hypokalemia likely due to diuretic and k dur added for supplementation.  Acute on chronic renal failure has improved overall to baseline around 1.4-1.5 range.   LUE edema noted at admission and dopplers done revealing superficial left cephalic vein thrombosis which was treated with heat and compression. Her blood pressures were monitored on TID basis and has been stable. Constipation has resolved with use of miralax .  She has had issues with hip pain recently and has affected activity.  Xrays of hip and knee done  and questioned occult superior pubic rami Fx. Results discussed with Dr. Vassie who recommended WBAT and CT pelvis if pain continued to be a limiting factor.  Follow up CBC showed H/H to be stable overall. She was noted to have heme positive stool X 1 on 09/22 question due to hems and pepcid  added for GI prophylaxis as protonix   cannot be crushed in addition to anusol  suppository prn.    Rehab course: During patient's stay in rehab weekly team conferences were held to monitor patient's progress, set goals and discuss barriers to discharge. At admission, patient required total assist with mobility and with ADL tasks. She exhibited severe to profound deficits in regards to orientation, problem  solving and attention. Speech was intelligible but verbal output was limited. She required hand over hand assist for self feeding. She  has had improvement in activity tolerance, balance, postural control as well as ability to compensate for deficits. She requires max assist with ADL tasks.She requires max assist for bed mobility and sit to stand transfers.  Gait not tested and she requires total assist to propel a wheelchair. She requires set up assist with mod cues and max assist with self feeding. She requires max to total assist with cognitive tasks.    Disposition: Discharge disposition: 03-Skilled Nursing Facility     Skilled Nursing Facility  Diet: Dysphagia 2, honey liquids. Full supervision and assistance with meals. Has to be upright for  meals and sit up for 45-60 minutes after meals.   Special Instructions: Recheck BMET/CBC in 3-5 days to monitor potassium level, renal status and H/H. Encourage pulmonary hygiene. Assist at meals to help improve intake. Perform oral care after meals.  4. Toilet patient every 4 hours while awake.  Discharge Instructions     Ambulatory referral to Cardiology   Complete by: As directed    Needs Hospital follow up. Contact [facility] or significant other with appointment.   Ambulatory referral to Neurology   Complete by: As directed    An appointment is requested in approximately: 6 weeks   Ambulatory referral to Physical Medicine Rehab   Complete by: As directed       Allergies as of 05/28/2024       Reactions   Lactose Intolerance (gi) Diarrhea   Clindamycin/lincomycin Other (See Comments)   Cannot tolerate mycins causes C-Diff   Erythromycin    Other Reaction(s): Not available erythromycin   Milk-related Compounds Diarrhea   Tetracyclines & Related Other (See Comments)   Feel really nervous        Medication List     STOP taking these medications    amoxicillin -clavulanate 500-125 MG tablet Commonly known as:  AUGMENTIN    furosemide  10 MG/ML injection Commonly known as: LASIX  Replaced by: furosemide  40 MG tablet   metFORMIN  500 MG 24 hr tablet Commonly known as: GLUCOPHAGE -XR   Vitamin D3 50 MCG (2000 UT) capsule       TAKE these medications    acetaminophen  500 MG tablet Commonly known as: TYLENOL  Take 2 tablets (1,000 mg total) by mouth 3 (three) times daily.   aspirin  EC 81 MG tablet Take 1 tablet (81 mg total) by mouth daily. Swallow whole.   ATHLETES FOOT EX Apply 1 Application topically 2 (two) times daily as needed (Rash).   atorvastatin  20 MG tablet Commonly known as: LIPITOR Take 1 tablet (20 mg total) by mouth daily. Hold until you are taking Paxlovid    clopidogrel  75 MG tablet Commonly known as: PLAVIX  Take 1 tablet (75 mg total) by mouth daily.   donepezil  10 MG tablet Commonly known as: ARICEPT  Take 10 mg by mouth at bedtime.   famotidine  20 MG tablet Commonly known as: PEPCID  Take 1 tablet (20 mg total) by mouth 2 (two) times daily.   feeding supplement (KATE FARMS STANDARD ENT 1.4) Liqd liquid Take 325 mLs by  mouth 2 (two) times daily between meals. Notes to patient: Has to be thickened to honey consistency   furosemide  40 MG tablet Commonly known as: LASIX  Take 1 tablet (40 mg total) by mouth daily. Replaces: furosemide  10 MG/ML injection   hydrocortisone  2.5 % cream Apply 1 Application topically 2 (two) times daily as needed (Rash).   hydrocortisone  25 MG suppository Commonly known as: ANUSOL -HC Place 1 suppository (25 mg total) rectally 2 (two) times daily as needed for hemorrhoids or anal itching.   insulin  aspart 100 UNIT/ML injection Commonly known as: novoLOG  Inject 0-5 Units into the skin 3 (three) times daily with meals. For BS 150-200--administer 1 unit SQ For BS 201-250--administer 2 units SQ For BS 251- 300 --administer 3 units SQ For BS 301-350 --administer 4 units SQ For BS 351-400-- administer 5 units SQ. For BS > 400 call MD.    insulin  glargine 100 UNIT/ML injection Commonly known as: LANTUS  Inject 0.08 mLs (8 Units total) into the skin 2 (two) times daily.   levETIRAcetam  500 MG tablet Commonly known as: Keppra  Take 1 tablet (500 mg total) by mouth 2 (two) times daily.   megestrol  400 MG/10ML suspension Commonly known as: MEGACE  Take 5 mLs (200 mg total) by mouth 2 (two) times daily.   melatonin 5 MG Tabs Take 1 tablet (5 mg total) by mouth at bedtime as needed.   multivitamin with minerals Tabs tablet Take 1 tablet by mouth daily.   nystatin  powder Commonly known as: MYCOSTATIN /NYSTOP  Apply topically 2 (two) times daily. Notes to patient: Apply to perineum and inguinal areas   polyethylene glycol 17 g packet Commonly known as: MIRALAX  / GLYCOLAX  Take 34 g by mouth daily.   potassium chloride  SA 20 MEQ tablet Commonly known as: KLOR-CON  M Take 1 tablet (20 mEq total) by mouth daily.   QUEtiapine  25 MG tablet Commonly known as: SEROQUEL  Take 0.5 tablets (12.5 mg total) by mouth at bedtime.   Repatha  SureClick 140 MG/ML Soaj Generic drug: Evolocumab  Inject 140 mg into the skin every 14 (fourteen) days. Sunday   Semaglutide (0.25 or 0.5MG /DOS) 2 MG/3ML Sopn Inject 0.5 mg into the skin once a week. What changed: how much to take Notes to patient: Can resume after discharge   spironolactone  25 MG tablet Commonly known as: ALDACTONE  Take 0.5 tablets (12.5 mg total) by mouth daily.   SYSTANE FREE OP Apply 1 drop to eye daily as needed (dryness/irritation).   tamsulosin  0.4 MG Caps capsule Commonly known as: FLOMAX  Take 1 capsule (0.4 mg total) by mouth daily after supper.   Zinc  Oxide 12.8 % ointment Commonly known as: TRIPLE PASTE Apply topically as needed for irritation.        Contact information for follow-up providers     Clarice Nottingham, MD Follow up.   Specialty: Internal Medicine Why: Call in 1-2 days for post hospital follow up Contact information: [address]  [address] [place] [address] [phone]         Emeline Search C, DO Follow up.   Specialty: Physical Medicine and Rehabilitation Why: office will call you with follow up appointment Contact information: [address] [place] [address] [phone]         [facility] Follow up.   Why: office will call you with follow up appointment Contact information: [address]     [address] [place]  [address] [phone]        Cesario Chamorro, MD Follow up.  Specialty: Cardiology Why: office will call you with follow up appointment Contact information: [address] [place] [address] [phone]              Contact information for after-discharge care     Destination     [facility] .   Service: Skilled Nursing Contact information: [address] [place]  [address] [phone]                     Signed: Sharlet GORMAN Schmitz 05/28/2024, 11:16 AM

## 2024-05-27 NOTE — [place] FL2 (Signed)
 [place]  MEDICAID FL2 LEVEL OF CARE FORM     IDENTIFICATION  Patient Name: Beth Macdonald Birthdate: Apr 28, 1946 Sex: female Admission Date (Current Location): 05/20/2024  [facility] and [place] Number:  Producer, television/film/video and Address:  [facility], 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) 05/20/2024   Acute systolic heart failure (HCC) 05/17/2024   ST elevation 05/17/2024   Elevated troponin 05/17/2024   Malnutrition of moderate degree 05/14/2024   Hypotension 05/14/2024   Focal seizures (HCC) 05/11/2024   Acute CVA (cerebrovascular accident) (HCC) 04/12/2024   Acute ischemic stroke (HCC) 03/14/2024   Weakness 11/16/2022   COVID-19 virus infection 11/15/2022   Sepsis (HCC) 11/15/2022   Emesis 11/15/2022   Metabolic acidosis 11/15/2022   CVA (cerebral vascular accident) (HCC) 06/05/2022   Essential hypertension 06/04/2022   Osteoarthritis of right knee 01/01/2022   Bilateral hip pain 11/23/2021   Murmur 03/16/2020   History of stroke 02/02/2019   Mixed hyperlipidemia 02/02/2019   TIA (transient ischemic attack) 10/29/2018   Dyslipidemia 10/29/2018   Type 2 diabetes mellitus with hyperlipidemia (HCC) 10/29/2018   Tendinopathy of hip 07/12/2016   OA (osteoarthritis) of knee 05/29/2016   Sleep disturbance 05/29/2016   Bilateral  acromioclavicular joint arthritis 11/08/2015   Dizzy 07/21/2015   Rotator cuff syndrome of both shoulders 06/21/2015   Pain in joint, ankle and foot 10/20/2012   Bilateral leg weakness 10/20/2012    Orientation RESPIRATION BLADDER Height & Weight     Self, Situation  Normal Incontinent Weight: 154 lb 12.2 oz (70.2 kg) Height:  5' 8 (172.7 cm)  BEHAVIORAL SYMPTOMS/MOOD NEUROLOGICAL BOWEL NUTRITION STATUS      Incontinent Diet (D2 honey thick)  AMBULATORY STATUS COMMUNICATION OF NEEDS Skin   Extensive Assist Verbally Normal                       Personal Care Assistance Level of Assistance  Bathing, Dressing Bathing Assistance: Limited assistance   Dressing Assistance: Limited assistance     Functional Limitations Info  Sight, Speech, Hearing Sight Info: Adequate Hearing Info: Adequate Speech Info: Adequate    SPECIAL CARE FACTORS FREQUENCY  PT (By licensed PT), OT (By licensed OT), Speech therapy     PT Frequency: 5xs per week OT Frequency: 5xs per week     Speech Therapy Frequency: 5xs per week      Contractures Contractures Info: Not present    Additional Factors Info  Code Status, Allergies Code Status Info: Full Allergies Info: See discharge ijnstructions           Current Medications (05/27/2024):  This is the current hospital active medication list Current Facility-Administered Medications  Medication Dose Route Frequency Provider Last Rate Last Admin   acetaminophen  (TYLENOL ) tablet 1,000 mg  1,000 mg Oral TID Engler, Morgan C, DO   1,000 mg at 05/27/24 [phone]  alum & mag hydroxide-simeth (MAALOX/MYLANTA) 200-200-20 MG/5ML suspension 30 mL  30 mL Oral Q4H PRN Love, Pamela S, PA-C       amoxicillin -clavulanate (AUGMENTIN ) 500-125 MG per tablet 1 tablet  1 tablet Oral Q12H Love, Pamela S, PA-C   1 tablet at 05/27/24 9095   aspirin  chewable tablet 81 mg  81 mg Oral Daily Engler, Morgan C, DO   81 mg at 05/27/24 9095   atorvastatin  (LIPITOR) tablet 20  mg  20 mg Oral Daily Engler, Morgan C, DO   20 mg at 05/27/24 9095   bisacodyl  (DULCOLAX) suppository 10 mg  10 mg Rectal Daily PRN Love, Pamela S, PA-C       clopidogrel  (PLAVIX ) tablet 75 mg  75 mg Oral Daily Engler, Morgan C, DO   75 mg at 05/27/24 9095   diphenhydrAMINE  (BENADRYL ) capsule 25 mg  25 mg Oral Q6H PRN Love, Pamela S, PA-C       donepezil  (ARICEPT ) tablet 10 mg  10 mg Oral QHS Engler, Morgan C, DO   10 mg at 05/26/24 2051   enoxaparin  (LOVENOX ) injection 30 mg  30 mg Subcutaneous Q24H Love, Pamela S, PA-C   30 mg at 05/27/24 0902   feeding supplement (KATE FARMS STANDARD ENT 1.4) liquid 325 mL  325 mL Oral BID BM Love, Pamela S, PA-C   325 mL at 05/27/24 0901   furosemide  (LASIX ) tablet 40 mg  40 mg Oral Daily Engler, Morgan C, DO   40 mg at 05/27/24 9095   guaiFENesin -dextromethorphan  (ROBITUSSIN DM) 100-10 MG/5ML syrup 5-10 mL  5-10 mL Oral Q6H PRN Love, Pamela S, PA-C       hydrocortisone  (ANUSOL -HC) suppository 25 mg  25 mg Rectal BID PRN Engler, Morgan C, DO       insulin  aspart (novoLOG ) injection 0-5 Units  0-5 Units Subcutaneous QHS Love, Pamela S, PA-C   5 Units at 05/20/24 2023   insulin  aspart (novoLOG ) injection 0-9 Units  0-9 Units Subcutaneous TID WC Love, Pamela S, PA-C   1 Units at 05/27/24 9096   insulin  glargine (LANTUS ) injection 8 Units  8 Units Subcutaneous BID Love, Pamela S, PA-C   8 Units at 05/27/24 9097   levETIRAcetam  (KEPPRA ) tablet 500 mg  500 mg Oral BID Love, Pamela S, PA-C   500 mg at 05/27/24 9095   megestrol  (MEGACE ) 400 MG/10ML suspension 200 mg  200 mg Oral BID Engler, Morgan C, DO   200 mg at 05/27/24 9096   melatonin tablet 5 mg  5 mg Oral QHS PRN Love, Pamela S, PA-C       multivitamin with minerals tablet 1 tablet  1 tablet Oral Daily Maurice Sharlet RAMAN, PA-C   1 tablet at 05/27/24 9095   nystatin  (MYCOSTATIN /NYSTOP ) topical powder   Topical BID Love, Pamela S, PA-C   Given at 05/27/24 9078   nystatin  (MYCOSTATIN /NYSTOP ) topical powder   Topical  BID Emeline Joesph BROCKS, DO   Given at 05/27/24 9093   Oral care mouth rinse  15 mL Mouth Rinse PRN Love, Sharlet RAMAN, PA-C       oxyCODONE  (Oxy IR/ROXICODONE ) immediate release tablet 2.5 mg  2.5 mg Oral Q8H PRN Engler, Morgan C, DO   2.5 mg at 05/24/24 0601   pantoprazole  (PROTONIX ) EC tablet 40 mg  40 mg Oral BID AC Engler, Morgan C, DO   40 mg at 05/26/24 1736   polyethylene glycol (MIRALAX  / GLYCOLAX ) packet 34 g  34 g Oral Daily Urbano Albright,  MD   34 g at 05/27/24 9093   prochlorperazine  (COMPAZINE ) tablet 5-10 mg  5-10 mg Oral Q6H PRN Love, Pamela S, PA-C       Or   prochlorperazine  (COMPAZINE ) suppository 12.5 mg  12.5 mg Rectal Q6H PRN Love, Pamela S, PA-C       Or   prochlorperazine  (COMPAZINE ) injection 5-10 mg  5-10 mg Intravenous Q6H PRN Love, Pamela S, PA-C       QUEtiapine  (SEROQUEL ) tablet 12.5 mg  12.5 mg Oral QHS Love, Pamela S, PA-C   12.5 mg at 05/26/24 1736   QUEtiapine  (SEROQUEL ) tablet 12.5 mg  12.5 mg Oral QHS PRN Love, Pamela S, PA-C   12.5 mg at 05/26/24 2052   sodium phosphate  (FLEET) enema 1 enema  1 enema Rectal Once PRN Love, Pamela S, PA-C       spironolactone  (ALDACTONE ) tablet 12.5 mg  12.5 mg Oral Daily Love, Pamela S, PA-C   12.5 mg at 05/27/24 9097   tamsulosin  (FLOMAX ) capsule 0.4 mg  0.4 mg Oral QPC supper Engler, Morgan C, DO       Zinc  Oxide (TRIPLE PASTE) 12.8 % ointment   Topical PRN Engler, Morgan C, DO         Discharge Medications: Please see discharge summary for a list of discharge medications.  Relevant Imaging Results:  Relevant Lab Results:   Additional Information DD#[id number]  Graeme DELENA Jude, LCSW

## 2024-05-27 NOTE — Progress Notes (Addendum)
 Patient ID: Beth Macdonald, female   DOB: July 09, 1946, 78 y.o.   MRN: [id number]   1029- SW returned phone call to [name] to answer questions with regard to being available to assist with aunts transition to SNF. SW explained will need SNF location first before this can be determined. She intends to leave this weekend and her sister Beth Macdonald will be arriving to help likely by Monday. Family continues to [facility] facilities. Will follow-up tomorrow about preferred SNF locations.   *SW sent out SNF referral.   *Sw received aphone call from [name] to discuss preference. Prefers: [facility], [facility] or [facility]. Will follow-up with SW to decide on preferred location.  Family prefers [facility]. Brittany/Admissions with [facility] is able to extend bed offer. Medical team in agreement with transition. Pt will transition tomorrow to [facility]: Rm#[id number]/Nurse Report#[phone]; PTAR pick up at 11am.   1403- SW spoke with [name] and [name] to inform on above.   Graeme Jude, MSW, LCSW Office: [phone] Cell: [phone] Fax: [phone]

## 2024-05-27 NOTE — Progress Notes (Signed)
 Inpatient Rehabilitation Discharge Medication Review by a Pharmacist  A complete drug regimen review was completed for this patient to identify any potential clinically significant medication issues.  High Risk Drug Classes Is patient taking? Indication by Medication  Antipsychotic No   Anticoagulant No   Antibiotic No   Opioid Yes Oxycodone  - pain  Antiplatelet Yes ASA - TIA/stroke Clopidogrel  - TIA/stroke  Hypoglycemics/insulin  Yes Novolog  SSI - DM Semaglutide  - DM Glargine insulin  - DM  Vasoactive Medication No   Chemotherapy No   Other Yes APAP - pain Famotidine  - GERD Lasix  - HF Anusol  Supp - Hemorrhoids Megace  - appetite stimulant Melatonin - sleep MVI, potassium - supplement Nystatin  powder - Candida vaginits PEG - bowel regimen Flomax  - urinary retention Atorvastatin  - HLD Donepezil  - dementia Keppra  - seizure Seroquel  - mood Repatha  - HLD Spironolactone  - HF     Type of Medication Issue Identified Description of Issue Recommendation(s)  Drug Interaction(s) (clinically significant)     Duplicate Therapy  Duplicate orders for Megace  and Nystatin  Communicated with Pam Love to discontinue duplicates on 9/24 > completed 9/25 prior to discharge.  Allergy     No Medication Administration End Date     Incorrect Dose     Additional Drug Therapy Needed  Prescribed Novolog  SSI at bedtime only.   Consider adding SSI coverage with meals as well > added prior to discharge on 9/25.  Significant med changes from prior encounter (inform family/care partners about these prior to discharge).    Other       Clinically significant medication issues were identified that warrant physician communication and completion of prescribed/recommended actions by midnight of the next day:  Yes  Name of provider notified for urgent issues identified: Holley Schmitz  Provider Method of Notification: telephone  Pharmacist comments: see chart above  Time spent performing this drug regimen  review (minutes):  20   Evanna Washinton, [place] 05/27/2024 2:46 PM  9/25 Addendum:  - Duplicate megestrol  and nystatin  discontinued and mealtime SSI added.  - Potassium supplement also added for K+ 3.4 today.  Genaro Zebedee Calin, [place] 05/28/2024 12:19 PM

## 2024-05-27 NOTE — Progress Notes (Signed)
 Occupational Therapy Discharge Summary  Patient Details  Name: Beth Macdonald MRN: [id number] Date of Birth: 1946-06-04  Date of Discharge from OT service:May 27, 2024  Today's Date: 05/27/2024 OT Individual Time: 8696-8584 OT Individual Time Calculation (min): 72 min    Patient has met 8 of 14 long term goals due to improved balance, postural control, and ability to compensate for deficits.  Patient to discharge at [facility] Max Assist level.  Patient's care partner unavailable to provide the necessary physical and cognitive assistance at discharge.    Reasons goals not met: Pt's admission to rehab was to decrease burden of care for planned SNF d/c. D/t transfers being a max A level, down a from a total A of 2, she is ready for discharge despite other goals not being met. Pt remains limited by baseline cognitive deficits that impact her ability to carryover education provided.   Recommendation:  Patient will benefit from ongoing skilled OT services in skilled nursing facility setting to continue to advance functional skills in the area of BADL.  Equipment: No equipment provided  Reasons for discharge: discharge from hospital and planned d/c to SNF  Patient/family agrees with progress made and goals achieved: Yes  Skilled OT intervention Pt received in the w/c with generalized discomfort in her pelvis from sitting. Tilted the TIS w/c forward and she reported relief. She as initially very alert and when offered a shower was very excited. Retrieved the roll in shower chair and brought to room. Completed several sit > stands in hope for stand pivot and pt was too limited and perseverative on pain. Decided not to pursue shower and completed UB bathing/dressing with min A and mod cueing sink side. She declined LB ADLs. She was brought to the therapy gym following. Engaged pt in simple one step direction following during table top tasks to decrease caregiver burden during ADLs. She  demonstrated absent error recognition and once a motor pattern was established she had difficulty breaking the familiar pattern. She benefited most from [facility] facilitation and visual demonstration. Simple, direct cueing utilized. She completed BUE strengthening circuit holding a 3 lb dowel- with facilitation required for LUE integration into task- performed for both BUE strengthening and LUE NMR. She benefited from constant visual demonstration. She completed shoulder and chest press, 3x10 repetitions. Followed this with 3x10 repetitions weighted leg extensions to increase LE strength for sit > stand transfers. Pt ended with very brief trial on the BUE ergometer for increased UE endurance but was limited by perseveration on pain and only completed 2 minutes. She returned to her room and completed a stand pivot back to bed with max A. She rolled R and L with max A for total A peri hygiene. She had an incontinent BM. She was left supine with all needs met. Bed alarm set.   OT Discharge Precautions/Restrictions  Precautions Precautions: Fall Recall of Precautions/Restrictions: Impaired Precaution/Restrictions Comments: L inattention Restrictions Weight Bearing Restrictions Per Provider Order: No Pain Pain Assessment Pain Scale: Faces Faces Pain Scale: Hurts little more Pain Location: Generalized Pain Intervention(s): Medication (See eMAR) ADL ADL Eating: Moderate assistance Where Assessed-Eating: Wheelchair Grooming: Minimal assistance Where Assessed-Grooming: Sitting at sink Upper Body Bathing: Minimal assistance Where Assessed-Upper Body Bathing: Wheelchair Lower Body Bathing: Maximal assistance Where Assessed-Lower Body Bathing: Bed level Upper Body Dressing: Minimal assistance Where Assessed-Upper Body Dressing: Wheelchair Lower Body Dressing: Maximal assistance Where Assessed-Lower Body Dressing: Wheelchair Toileting: Dependent Where Assessed-Toileting: Bed level Toilet Transfer:  Maximal assistance Toilet Transfer Method: Stand  pivot Vision Baseline Vision/History: 1 Wears glasses Patient Visual Report: Blurring of vision Vision Assessment?: Yes Eye Alignment: Within Functional Limits Ocular Range of Motion: Within Functional Limits Alignment/Gaze Preference: Within Defined Limits Additional Comments: Nystagmus at rest. Perception  Perception: Impaired Praxis Praxis: Impaired Praxis Impairment Details: Ideomotor;Motor planning Cognition Cognition Overall Cognitive Status: History of cognitive impairments - at baseline Arousal/Alertness: Awake/alert Orientation Level: Person;Place;Nonverbal/unable to assess;Situation Person: Oriented Place: Disoriented Situation: Disoriented Memory: Impaired Memory Impairment: Decreased short term memory Decreased Short Term Memory: Verbal basic;Functional basic Attention: Selective Selective Attention: Impaired Selective Attention Impairment: Verbal basic;Functional basic Awareness: Impaired Awareness Impairment: Intellectual impairment Problem Solving: Impaired Problem Solving Impairment: Functional basic;Verbal basic Safety/Judgment: Impaired Brief Interview for Mental Status (BIMS) Repetition of Three Words (First Attempt): No answer Temporal Orientation: Year: Nonsensical Temporal Orientation: Month: Nonsensical Temporal Orientation: Day: Nonsensical Recall: Sock: No answer Recall: Blue: No answer Recall: Bed: No answer BIMS Summary Score: 99 Sensation Sensation Light Touch: Impaired Detail Light Touch Impaired Details: Impaired LUE Proprioception: Impaired Detail Proprioception Impaired Details: Impaired LUE;Impaired LLE Coordination Gross Motor Movements are Fluid and Coordinated: No Fine Motor Movements are Fluid and Coordinated: No Coordination and Movement Description: generalized deconditioning and limited by pain and cognition Motor  Motor Motor: Hemiplegia;Abnormal postural alignment  and control Mobility  Bed Mobility Bed Mobility: Sit to Supine;Supine to Sit Supine to Sit: Maximal Assistance - Patient - Patient 25-49% Sit to Supine: Maximal Assistance - Patient 25-49% Transfers Sit to Stand: Maximal Assistance - Patient 25-49% Stand to Sit: Maximal Assistance - Patient 25-49%  Trunk/Postural Assessment  Cervical Assessment Cervical Assessment: Exceptions to [facility] (forward head) Thoracic Assessment Thoracic Assessment: Exceptions to [facility] (rounded shoulders) Lumbar Assessment Lumbar Assessment: Exceptions to [facility] (posterior pelvic tilt) Postural Control Postural Control: Deficits on evaluation Righting Reactions: delayed Protective Responses: delayed  Balance Balance Balance Assessed: Yes Static Sitting Balance Static Sitting - Balance Support: Feet supported Static Sitting - Level of Assistance: 5: Stand by assistance (CGA) Dynamic Sitting Balance Dynamic Sitting - Balance Support: Bilateral upper extremity supported Dynamic Sitting - Level of Assistance: 4: Min assist Static Standing Balance Static Standing - Balance Support: During functional activity;Bilateral upper extremity supported Static Standing - Level of Assistance: 2: Max assist Dynamic Standing Balance Dynamic Standing - Balance Support: During functional activity;Bilateral upper extremity supported Dynamic Standing - Level of Assistance: 2: Max assist Extremity/Trunk Assessment RUE Assessment RUE Assessment: Within Functional Limits LUE Assessment LUE Assessment: Exceptions to [facility] Active Range of Motion (AROM) Comments: 0-70degrees shoulder flexion General Strength Comments: 2+/5   Nena VEAR Moats 05/27/2024, 2:39 PM

## 2024-05-28 DIAGNOSIS — E782 Mixed hyperlipidemia: Secondary | ICD-10-CM | POA: Diagnosis not present

## 2024-05-28 DIAGNOSIS — M6281 Muscle weakness (generalized): Secondary | ICD-10-CM | POA: Diagnosis not present

## 2024-05-28 DIAGNOSIS — I5021 Acute systolic (congestive) heart failure: Secondary | ICD-10-CM | POA: Diagnosis not present

## 2024-05-28 DIAGNOSIS — J690 Pneumonitis due to inhalation of food and vomit: Secondary | ICD-10-CM | POA: Diagnosis not present

## 2024-05-28 DIAGNOSIS — I6930 Unspecified sequelae of cerebral infarction: Secondary | ICD-10-CM | POA: Diagnosis not present

## 2024-05-28 DIAGNOSIS — E440 Moderate protein-calorie malnutrition: Secondary | ICD-10-CM | POA: Diagnosis not present

## 2024-05-28 DIAGNOSIS — I959 Hypotension, unspecified: Secondary | ICD-10-CM | POA: Diagnosis not present

## 2024-05-28 DIAGNOSIS — E1140 Type 2 diabetes mellitus with diabetic neuropathy, unspecified: Secondary | ICD-10-CM | POA: Diagnosis not present

## 2024-05-28 DIAGNOSIS — K219 Gastro-esophageal reflux disease without esophagitis: Secondary | ICD-10-CM | POA: Diagnosis not present

## 2024-05-28 DIAGNOSIS — Z7401 Bed confinement status: Secondary | ICD-10-CM | POA: Diagnosis not present

## 2024-05-28 DIAGNOSIS — R2689 Other abnormalities of gait and mobility: Secondary | ICD-10-CM | POA: Diagnosis not present

## 2024-05-28 DIAGNOSIS — R278 Other lack of coordination: Secondary | ICD-10-CM | POA: Diagnosis not present

## 2024-05-28 DIAGNOSIS — M1711 Unilateral primary osteoarthritis, right knee: Secondary | ICD-10-CM | POA: Diagnosis not present

## 2024-05-28 DIAGNOSIS — E785 Hyperlipidemia, unspecified: Secondary | ICD-10-CM | POA: Diagnosis not present

## 2024-05-28 DIAGNOSIS — B3731 Acute candidiasis of vulva and vagina: Secondary | ICD-10-CM | POA: Diagnosis not present

## 2024-05-28 DIAGNOSIS — R569 Unspecified convulsions: Secondary | ICD-10-CM | POA: Diagnosis not present

## 2024-05-28 DIAGNOSIS — E0840 Diabetes mellitus due to underlying condition with diabetic neuropathy, unspecified: Secondary | ICD-10-CM | POA: Diagnosis not present

## 2024-05-28 DIAGNOSIS — R41841 Cognitive communication deficit: Secondary | ICD-10-CM | POA: Diagnosis not present

## 2024-05-28 DIAGNOSIS — E119 Type 2 diabetes mellitus without complications: Secondary | ICD-10-CM | POA: Diagnosis not present

## 2024-05-28 DIAGNOSIS — R413 Other amnesia: Secondary | ICD-10-CM | POA: Diagnosis not present

## 2024-05-28 DIAGNOSIS — N390 Urinary tract infection, site not specified: Secondary | ICD-10-CM | POA: Diagnosis not present

## 2024-05-28 DIAGNOSIS — G510 Bell's palsy: Secondary | ICD-10-CM | POA: Diagnosis not present

## 2024-05-28 DIAGNOSIS — G479 Sleep disorder, unspecified: Secondary | ICD-10-CM | POA: Diagnosis not present

## 2024-05-28 DIAGNOSIS — R1312 Dysphagia, oropharyngeal phase: Secondary | ICD-10-CM | POA: Diagnosis not present

## 2024-05-28 DIAGNOSIS — G40909 Epilepsy, unspecified, not intractable, without status epilepticus: Secondary | ICD-10-CM | POA: Diagnosis not present

## 2024-05-28 DIAGNOSIS — I10 Essential (primary) hypertension: Secondary | ICD-10-CM | POA: Diagnosis not present

## 2024-05-28 DIAGNOSIS — M1310 Monoarthritis, not elsewhere classified, unspecified site: Secondary | ICD-10-CM | POA: Diagnosis not present

## 2024-05-28 DIAGNOSIS — R531 Weakness: Secondary | ICD-10-CM | POA: Diagnosis not present

## 2024-05-28 LAB — CBC
HCT: 32.4 % — ABNORMAL LOW (ref 36.0–46.0)
Hemoglobin: 10.9 g/dL — ABNORMAL LOW (ref 12.0–15.0)
MCH: 31.1 pg (ref 26.0–34.0)
MCHC: 33.6 g/dL (ref 30.0–36.0)
MCV: 92.6 fL (ref 80.0–100.0)
Platelets: 307 K/uL (ref 150–400)
RBC: 3.5 MIL/uL — ABNORMAL LOW (ref 3.87–5.11)
RDW: 14.1 % (ref 11.5–15.5)
WBC: 8.7 K/uL (ref 4.0–10.5)
nRBC: 0 % (ref 0.0–0.2)

## 2024-05-28 LAB — BASIC METABOLIC PANEL WITH GFR
Anion gap: 8 (ref 5–15)
BUN: 26 mg/dL — ABNORMAL HIGH (ref 8–23)
CO2: 28 mmol/L (ref 22–32)
Calcium: 8.3 mg/dL — ABNORMAL LOW (ref 8.9–10.3)
Chloride: 103 mmol/L (ref 98–111)
Creatinine, Ser: 1.51 mg/dL — ABNORMAL HIGH (ref 0.44–1.00)
GFR, Estimated: 35 mL/min — ABNORMAL LOW (ref >60)
Glucose, Bld: 123 mg/dL — ABNORMAL HIGH (ref 70–99)
Potassium: 3.4 mmol/L — ABNORMAL LOW (ref 3.5–5.1)
Sodium: 139 mmol/L (ref 135–145)

## 2024-05-28 LAB — GLUCOSE, CAPILLARY
Glucose-Capillary: 109 mg/dL — ABNORMAL HIGH (ref 70–99)
Glucose-Capillary: 157 mg/dL — ABNORMAL HIGH (ref 70–99)

## 2024-05-28 MED ORDER — POTASSIUM CHLORIDE CRYS ER 20 MEQ PO TBCR: 20.0000 meq | EXTENDED_RELEASE_TABLET | Freq: Every day | ORAL | Status: AC

## 2024-05-28 MED ORDER — POTASSIUM CHLORIDE CRYS ER 20 MEQ PO TBCR: 40.0000 meq | EXTENDED_RELEASE_TABLET | Freq: Once | ORAL | Status: DC

## 2024-05-28 MED ORDER — POTASSIUM CHLORIDE CRYS ER 20 MEQ PO TBCR: 20.0000 meq | EXTENDED_RELEASE_TABLET | Freq: Two times a day (BID) | ORAL | Status: DC

## 2024-05-28 MED ORDER — POTASSIUM CHLORIDE CRYS ER 20 MEQ PO TBCR: 20.0000 meq | EXTENDED_RELEASE_TABLET | Freq: Every day | ORAL | Status: DC

## 2024-05-28 MED ORDER — INSULIN ASPART 100 UNIT/ML IJ SOLN: 0.0000 [IU] | Freq: Three times a day (TID) | INTRAMUSCULAR | Status: AC

## 2024-05-28 NOTE — Discharge Instructions (Signed)
 Inpatient Rehab Discharge Instructions  Syna Gad Niswander Discharge date and time:  05/28/24  Activities/Precautions/ Functional Status: Activity: activity as tolerated Diet:  D2, honey liquids Wound Care: keep wound clean and dry   Functional status:  ___ No restrictions     ___ Walk up steps independently _X__ 24/7 supervision/assistance   ___ Walk up steps with assistance ___ Intermittent supervision/assistance  ___ Bathe/dress independently ___ Walk with walker     ___ Bathe/dress with assistance ___ Walk Independently    ___ Shower independently ___ Walk with assistance    _X__ Shower with assistance _X__ No alcohol     ___ Return to work/school ________   Special Instructions:  STROKE/TIA DISCHARGE INSTRUCTIONS SMOKING Cigarette smoking nearly doubles your risk of having a stroke & is the single most alterable risk factor  If you smoke or have smoked in the last 12 months, you are advised to quit smoking for your health. Most of the excess cardiovascular risk related to smoking disappears within a year of stopping. Ask you doctor about anti-smoking medications [place] Quit Line: 1-800-QUIT NOW Free Smoking Cessation Classes [phone]  CHOLESTEROL Know your levels; limit fat & cholesterol in your diet  Lipid Panel     Component Value Date/Time   CHOL 120 04/11/2024 1148   TRIG 129 05/15/2024 0512   HDL 57 04/11/2024 1148   CHOLHDL 2.1 04/11/2024 1148   VLDL 33 04/11/2024 1148   LDLCALC 30 04/11/2024 1148     Many patients benefit from treatment even if their cholesterol is at goal. Goal: Total Cholesterol (CHOL) less than 160 Goal:  Triglycerides (TRIG) less than 150 Goal:  HDL greater than 40 Goal:  LDL (LDLCALC) less than 100   BLOOD PRESSURE American Stroke Association blood pressure target is less that 120/80 mm/Hg  Your discharge blood pressure is:  BP: (!) 107/55 Monitor your blood pressure Limit your salt and alcohol intake Many individuals will require  more than one medication for high blood pressure  DIABETES (A1c is a blood sugar average for last 3 months) Goal HGBA1c is under 7% (HBGA1c is blood sugar average for last 3 months)  Diabetes:     Lab Results  Component Value Date   HGBA1C 8.4 (H) 03/14/2024    Your HGBA1c can be lowered with medications, healthy diet, and exercise. Check your blood sugar as directed by your physician Call your physician if you experience unexplained or low blood sugars.  PHYSICAL ACTIVITY/REHABILITATION Goal is 30 minutes at least 4 days per week  Activity: Increase activity slowly, and No driving, Therapies: at SNF Return to work: N/A Activity decreases your risk of heart attack and stroke and makes your heart stronger.  It helps control your weight and blood pressure; helps you relax and can improve your mood. Participate in a regular exercise program. Talk with your doctor about the best form of exercise for you (dancing, walking, swimming, cycling).  DIET/WEIGHT Goal is to maintain a healthy weight  Your discharge diet is:  Diet Order             DIET DYS 2 Room service appropriate? No; Fluid consistency: Honey Thick  Diet effective now                   liquids Your height is:  Height: 5' 8 (172.7 cm) Your current weight is: Weight: 69 kg Your Body Mass Index (BMI) is:  BMI (Calculated): 23.13 Following the type of diet specifically designed for you will  help prevent another stroke. You are at goal weight    Your goal Body Mass Index (BMI) is 19-24. Healthy food habits can help reduce 3 risk factors for stroke:  High cholesterol, hypertension, and excess weight.  RESOURCES Stroke/Support Group:  Call [phone]   STROKE EDUCATION PROVIDED/REVIEWED AND GIVEN TO PATIENT Stroke warning signs and symptoms How to activate emergency medical system (call 911). Medications prescribed at discharge. Need for follow-up after discharge. Personal risk factors for stroke. Pneumonia vaccine  given:  Flu vaccine given:  My questions have been answered, the writing is legible, and I understand these instructions.  I will adhere to these goals & educational materials that have been provided to me after my discharge from the hospital.     My questions have been answered and I understand these instructions. I will adhere to these goals and the provided educational materials after my discharge from the hospital.  Patient/Caregiver Signature _______________________________ Date __________  Clinician Signature _______________________________________ Date __________  Please bring this form and your medication list with you to all your follow-up doctor's appointments.

## 2024-05-28 NOTE — Progress Notes (Signed)
 Speech Language Pathology Discharge Summary  Patient Details  Name: Beth Macdonald MRN: [id number] Date of Birth: 06-08-1946  Date of Discharge from SLP service:May 27, 2024   Patient has met  (0) of 5 long term goals.  Patient to discharge at overall Max level.  Reasons goals not met: shorter length of stay than anticipated and severity of deficits   Clinical Impression/Discharge Summary:  Limited to no progress noted this stay d/t profound nature of deficits, limited attention/alertness, and shorter length of stay than anticipated. She continues to present w/ severe to profound cognitive-linguistic deficits and severe oropharyngeal dysphagia. Recommend continuation of Honey Thick Liquids and Dys 2 textures. Requires full supervision during meals. Pt/family education complete. She will require 24/7 assist and continued ST upon d/c to further target remaining defiicts, maximize pt independence, and reduced caregiver burden.     Care Partner:  Caregiver Able to Provide Assistance: Yes  Type of Caregiver Assistance: Cognitive;Physical  Recommendation:  24 hour supervision/assistance;Skilled Nursing facility  Rationale for SLP Follow Up: Maximize cognitive function and independence;Maximize swallowing safety;Reduce caregiver burden   Equipment: none   Reasons for discharge: Discharged from hospital   Patient/Family Agrees with Progress Made and Goals Achieved: Yes    Beth Macdonald 05/28/2024, 10:39 AM

## 2024-05-28 NOTE — Progress Notes (Addendum)
 Ambulance arrived for transport. Family with patient during this time. ADLS addressed prior to discharge. Voided. Given PRN medication for nausea prior to leaving. Report given to Nurse at [facility] facility where patient was being discharged to. No further issues or concerns to report at this time.

## 2024-05-28 NOTE — Progress Notes (Signed)
 PROGRESS NOTE   Subjective/Complaints:  No events ovenright.  Patient resting well. Mildly hypotensive since starting Flomax , but patient asymptomatic, overall well-controlled. Bladder scans low overnight  A.m. labs significant for mild hypokalemia potassium 3.4, improving BUN, stable creatinine 1.4-1.5, stable hemoglobin 10.9.  ROS: Denies fevers, chills, N/V,  constipation, diarrhea, SOB, cough, chest pain, new weakness or paraesthesias.   Dysuria--ongoing Right hip pain--ongoing  Objective:   No results found.  Recent Labs    05/28/24 0536  WBC 8.7  HGB 10.9*  HCT 32.4*  PLT 307    Recent Labs    05/28/24 0536  NA 139  K 3.4*  CL 103  CO2 28  GLUCOSE 123*  BUN 26*  CREATININE 1.51*  CALCIUM  8.3*     Intake/Output Summary (Last 24 hours) at 05/28/2024 1016 Last data filed at 05/28/2024 0900 Gross per 24 hour  Intake 703 ml  Output --  Net 703 ml        Physical Exam: Vital Signs Blood pressure (!) 107/55, pulse 74, temperature 97.6 F (36.4 C), temperature source Oral, resp. rate 18, height 5' 8 (1.727 m), weight 69 kg, SpO2 95%.  Constitutional: No apparent distress. Appropriate appearance for age.  Laying in bed. HENT: No JVD. Neck Supple. Trachea midline.  Eyes:  PERRLA. EOMI. Visual fields grossly intact.  Cardiovascular: RRR, no murmurs/rub/gallops.  1+ left upper extremity edema. Peripheral pulses 2+  Respiratory: CTAB. No rales, rhonchi, or wheezing. On RA.   Abdomen: + bowel sounds, normoactive. No distention Skin: C/D/I. No apparent lesions.     GU: Vulvovaginal exam significant for some thick, white yeast appearing material around the clitoral hood but not extending into the vaginal introitus.  Continuous urine leak with inflamed, red skin on the inferior vaginal introitus from constant irritation.  MSK:      No apparent deformity.      Tender to palpation throughout bilateral  proximal lower extremities, lower abdomen/suprapubic area bilaterally.  No deformity.--unchanged        Cognition: AAO to person only; not place, time, or event.--Unchanged Language: Fluent, but hypophonic and with occasional perseveration.  Ongoing Memory: Severe deficits.  Ongoing Insight: Poor insight into current condition.  Ongoing Mood: Pleasant affect, appropriate mood.  Sensation: To light touch intact in BL UEs and LEs  Reflexes: 2+ in BL UE and LEs. Negative Hoffman's and babinski signs bilaterally.  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 Spasticity: MAS 0 in all extremities.       Strength:                  RUE: 5/5 SA, 5/5 EF, 5/5 EE, 5/5 WE, 5/5 FF, 5/5 FA                LUE:  3/5 SA, 4/5 EF, 4/5 EE, 4/5 WE, 4/5 FF, 4/5 FA                RLE: 3/5 HF, 3/5 KE, 4/5  DF, 4/5  EHL, 4/5  PF --complicated by pain with range of motion  LLE:  4/5 HF, 4/5 KE, 4/5  DF, 4/5  EHL, 4/5  PF    Physical exam unchanged from the above on reexamination 05/28/24     Assessment/Plan: 1. Functional deficits which require 3+ hours per day of interdisciplinary therapy in a comprehensive inpatient rehab setting. Physiatrist is providing close team supervision and 24 hour management of active medical problems listed below. Physiatrist and rehab team continue to assess barriers to discharge/monitor patient progress toward functional and medical goals  Care Tool:  Bathing    Body parts bathed by patient: Right arm, Left arm, Chest, Abdomen, Front perineal area, Right upper leg, Left upper leg, Face   Body parts bathed by helper: Buttocks, Right lower leg, Left lower leg     Bathing assist Assist Level: Maximal Assistance - Patient 24 - 49%     Upper Body Dressing/Undressing Upper body dressing   What is the patient wearing?: Pull over shirt    Upper body assist Assist Level: Minimal  Assistance - Patient > 75%    Lower Body Dressing/Undressing Lower body dressing      What is the patient wearing?: Pants, Incontinence brief     Lower body assist Assist for lower body dressing: Total Assistance - Patient < 25%     Toileting Toileting Toileting Activity did not occur (Clothing management and hygiene only): N/A (no void or bm)  Toileting assist Assist for toileting: Total Assistance - Patient < 25%     Transfers Chair/bed transfer  Transfers assist  Chair/bed transfer activity did not occur: Safety/medical concerns  Chair/bed transfer assist level: Maximal Assistance - Patient 25 - 49%     Locomotion Ambulation   Ambulation assist   Ambulation activity did not occur: Safety/medical concerns  Assist level: 2 helpers (+3 with WC follow) Assistive device:  (3 musketeers) Max distance: 4'   Walk 10 feet activity   Assist  Walk 10 feet activity did not occur: Safety/medical concerns        Walk 50 feet activity   Assist Walk 50 feet with 2 turns activity did not occur: Safety/medical concerns         Walk 150 feet activity   Assist Walk 150 feet activity did not occur: Safety/medical concerns         Walk 10 feet on uneven surface  activity   Assist Walk 10 feet on uneven surfaces activity did not occur: Safety/medical concerns         Wheelchair     Assist Is the patient using a wheelchair?: Yes Type of Wheelchair: Manual    Wheelchair assist level: Dependent - Patient 0% Max wheelchair distance: 150'    Wheelchair 50 feet with 2 turns activity    Assist        Assist Level: Dependent - Patient 0%   Wheelchair 150 feet activity     Assist      Assist Level: Dependent - Patient 0%   Blood pressure (!) 107/55, pulse 74, temperature 97.6 F (36.4 C), temperature source Oral, resp. rate 18, height 5' 8 (1.727 m), weight 69 kg, SpO2 95%.  Medical Problem List and Plan: 1. Functional deficits  secondary to seizure disorder with subsequent encephalopathy             -patient may shower             -ELOS/Goals: MaxA 10-14 days, then d/c to SNF, goal of CIR is to reduce burden of care--est 9/25, will send SNF referral today  Stable to continue CIR  - 9/23: Doping well - SPT Max A x1. C/b incontinence. Max  to total A all ADLs. Max A PT with STS; Total A for ambulation. Goals are for Max A +1 assist for SNF discharge. On Dys 2 with honey thick liquids. Profound cog-linguistic deficits. Not a candidate for frasier free water  protocol due to lethargy.   The patient is medically ready for discharge to SNF and will not need follow-up with [facility] PM[facility]R. In addition, they will need to follow up with their PCP, Neurology.   2.  Antithrombotics: -DVT/anticoagulation:  Pharmaceutical: Lovenox              -antiplatelet therapy: ASA/Plavis resumed 09/10.  3. Pain Management: tylenol  prn.    - 9/19: Right thigh pain with new findings of superior pubic rami fracture below; scheduled Tylenol  1000 mg 3 times daily, add as needed oxycodone  2.5 mg every 8 hours as needed for severe pain  - 9/20 inconsistent reports of pain, may be associated with cognition deficits.  Does not look uncomfortable and was noted to be sleeping earlier  - 9/21, patient indicates discomfort with palpation throughout her body, throughout arms and legs, clavicles, and abdomen, head.  Nursing documented 0 10 earlier this morning.  9-22: Ongoing suprapubic pain due to fracture.  Ongoing.  Hesitant to schedule oxycodone  given cognitive deficits, will apply aqua thermia heat pack to abdomen as needed for extra pain control  4. Mood/Behavior/Sleep: LCSW to follow for evaluation and support.              -antipsychotic agents: Seroquel   - 9-18: Sleeping well, mood well-controlled.  Start sleep log.--Sleeping appropriately.  5. Neuropsych/cognition/mixed dementia: This patient is not capable of making decisions on her own  behalf.  - Per patient's wife, managed by Dr. Rosemarie as outpatient  - On Aricept  10 mg nightly, resumed while in hospital; resume here 9-18  - 9-22: No notable improvements in cognition since admission to inpatient rehab.  6. Skin/Wound Care: Routine pressure relief measures.  7. Fluids/Electrolytes/Nutrition: Strict I/O. Check CMET in am   - 9/18: Add K CL 40 meq daily x3 days  9-23: Very poor p.o. intakes.  Start Megace  200 mg twice daily.  8. Aspiration PNA: Completed course of Rocephin . Started on Augmentin  09/17 for increase in Right base opacity. .             --WBC trending up 12.8-->15.2-->18.7-->20.4. Steroid effect?              --Recheck CXR in am -no consolidations or effusions   - 9/18: WBC downtrending 13.5; unfortunately did aspirate this a.m.  Discussed with pharmacy, should be covered by Augmentin , complete course.  9-22: WBC continues to downtrend, 12.2  9. ST elevation/ Acute sytolic CHF: Takotsubo v/s LAD disease. Strict I/O. Daily weights --Weight 167 at admission -->165 on 09/17.  Weight stable 9/21 weight trending down, will decrease Lasix  to daily from twice daily 9-22: Weight stable. Filed Weights   05/27/24 0127 05/27/24 0548 05/28/24 0619  Weight: 71.2 kg 70.2 kg 69 kg    10. Acute renal failure: BUN/SCr on steady rise with IV lasix  started on 09/16             --Recheck labs in am and may need to back off of worsening- -improving 1.8->1.4; stabilizing around 1.4  11. Hypernatremia: Likely due to poor intake.  --Recheck BMET in am--resolved  12. T2DM: Hgb A1c-8.4. Monitor BS ac/hs and use SSI for  elevated BS             --continue lantus  8 units BID-->may need to adjust as off tube feeds.    - -Relatively well-controlled on current regimen, trend  - 9/20-22 fair control continue current regimen and monitor Recent Labs    05/27/24 2105 05/28/24 0611 05/28/24 1013  GLUCAP 146* 109* 157*     13.  Dysphagia: continue D2, nectar liquids--strict  enforcement and supervision   14. LUE edema: Question due to IV infiltration v/s DVT. Dopplers ordered   - Edema reduced, Dopplers pending--not yet performed 9-19, will repeat.  - 9/21 age indeterminate superficial vein thrombosis involving the left cephalic vein--treat conservatively with heat, compression  15. New onset seizures: continue Keppra .     - 9/19: Notable left upper extremity continuous tremor on exam; patient responsive, mild tremor in right upper extremity, per patient's wife this has been ongoing.  Continue Keppra  current dose.  9-22: No further tremor noted   18.  Right thigh pain.  Present since at least 9-8; reported 9-19. x-ray hip and knee ordered for today.  - X-ray concerning for occult fracture of right superior pubic rami - Discussed with Dr. Montine, appears stable, placed weightbearing as tolerated for now, can consider CT if pain unable to control -orders placed  19.  Constipation.  LBM 9/16 recorded.  - X-ray abdomen to check stool burden- normal, will order 34 g of MiraLAX - thickend  -LBM 9/23, large  20. Dysuria/urinary retention  - 9/21 order PVRs and check UA/urine culture--negative UA, elevated PVRs, start timed toiletting  9-23: Pain ongoing, exam today significant for yeast appearing around the clitoral hood and moisture associated skin irritation in the posterior vaginal introitus from urine leak.  Schedule nystatin  powder twice daily, added Desitin ointment as needed, discussed with nursing.  9-24: Rare need for intermittent straight cath, with volumes about 400.  Start Flomax  0.4 mg nightly.  May be secondary to pubic fracture; no Foley as she can generally void on her own..  - 9-25.  No straight caths required overnight.  Continuous outpatient  21.  Anemia.  Worsening from 12.3 on admission to 10.7.  - FOBT pending  - Iron studies show elevated ferritin, normal TIBC and iron level.  - Trend with repeat labs Thursday.  No obvious sources of  bleeding.  9-23: FOBT positive, start Protonix  40 mg twice daily AC and add Anusol  suppository twice daily as needed.  9-24: No obvious sources of bleeding.switch from Protonix  to Pepcid  on discharge due to need to crush.  Continue Plavix . 9-25: Hemoglobin stable.   LOS: 8 days A FACE TO FACE EVALUATION WAS PERFORMED  Beth Macdonald 05/28/2024, 10:16 AM

## 2024-05-28 NOTE — Progress Notes (Signed)
 Inpatient Rehabilitation Care Coordinator Discharge Note   Patient Details  Name: Beth Macdonald MRN: [id number] Date of Birth: [birth year]   Discharge location: D/c to SNF- [facility]  Length of Stay: 7 days  Discharge activity level: wheelchair level dependent  Home/community participation: Limited  Patient response un:Yzjouy Literacy - How often do you need to have someone help you when you read instructions, pamphlets, or other written material from your doctor or pharmacy?: Rarely  Patient response un:Dnrpjo Isolation - How often do you feel lonely or isolated from those around you?: Patient unable to respond  Services provided included: MD, RD, PT, OT, SLP, RN, CM, TR, Pharmacy, Neuropsych, SW  Financial Services:  Field seismologist Utilized: Private Insurance [employer]  Choices offered to/list presented to: family  Follow-up services arranged:  Other (Comment) (N/A; SNF)           Patient response to transportation need: Is the patient able to respond to transportation needs?: Yes In the past 12 months, has lack of transportation kept you from medical appointments or from getting medications?: No In the past 12 months, has lack of transportation kept you from meetings, work, or from getting things needed for daily living?: No   Patient/Family verbalized understanding of follow-up arrangements:  Yes  Individual responsible for coordination of the follow-up plan: contact [name]  Confirmed correct DME delivered: Beth Macdonald 05/28/2024    Comments (or additional information):  Summary of Stay    Date/Time Discharge Planning CSW  05/25/24 1458 Plan remains for pt to d/c to SNF when appropriate. Family has SNF list to review for preference. SW will confirm there are no barriers to discharge. AAC       Beth Macdonald

## 2024-05-28 NOTE — Plan of Care (Signed)
   Problem: RH Swallowing Goal: LTG Patient will consume least restrictive diet using compensatory strategies with assistance (SLP) Description: LTG:  Patient will consume least restrictive diet using compensatory strategies with assistance (SLP) Outcome: Not Met (add Reason)   Problem: RH Cognition - SLP Goal: RH LTG Patient will demonstrate orientation with cues Description:  LTG:  Patient will demonstrate orientation to person/place/time/situation with cues (SLP)   Outcome: Not Met (add Reason)   Problem: RH Expression Communication Goal: LTG Patient will verbally express basic/complex needs(SLP) Description: LTG:  Patient will verbally express basic/complex needs, wants or ideas with cues  (SLP) Outcome: Not Met (add Reason)   Problem: RH Problem Solving Goal: LTG Patient will demonstrate problem solving for (SLP) Description: LTG:  Patient will demonstrate problem solving for basic/complex daily situations with cues  (SLP) Outcome: Not Met (add Reason)   Problem: RH Memory Goal: LTG Patient will use memory compensatory aids to (SLP) Description: LTG:  Patient will use memory compensatory aids to recall biographical/new, daily complex information with cues (SLP) Outcome: Not Met (add Reason)   Goals not met d/t shorter length of stay than anticipated and severity of deficits

## 2024-05-29 DIAGNOSIS — R413 Other amnesia: Secondary | ICD-10-CM | POA: Diagnosis not present

## 2024-05-29 DIAGNOSIS — E785 Hyperlipidemia, unspecified: Secondary | ICD-10-CM | POA: Diagnosis not present

## 2024-05-29 DIAGNOSIS — K219 Gastro-esophageal reflux disease without esophagitis: Secondary | ICD-10-CM | POA: Diagnosis not present

## 2024-05-30 NOTE — Progress Notes (Signed)
 Remote Loop Recorder Transmission

## 2024-06-04 DIAGNOSIS — I10 Essential (primary) hypertension: Secondary | ICD-10-CM | POA: Diagnosis not present

## 2024-06-04 DIAGNOSIS — E440 Moderate protein-calorie malnutrition: Secondary | ICD-10-CM | POA: Diagnosis not present

## 2024-06-04 DIAGNOSIS — I5021 Acute systolic (congestive) heart failure: Secondary | ICD-10-CM | POA: Diagnosis not present

## 2024-06-04 DIAGNOSIS — E782 Mixed hyperlipidemia: Secondary | ICD-10-CM | POA: Diagnosis not present

## 2024-06-04 DIAGNOSIS — E1169 Type 2 diabetes mellitus with other specified complication: Secondary | ICD-10-CM | POA: Diagnosis not present

## 2024-06-04 DIAGNOSIS — E039 Hypothyroidism, unspecified: Secondary | ICD-10-CM | POA: Diagnosis not present

## 2024-06-04 DIAGNOSIS — E785 Hyperlipidemia, unspecified: Secondary | ICD-10-CM | POA: Diagnosis not present

## 2024-06-04 DIAGNOSIS — N390 Urinary tract infection, site not specified: Secondary | ICD-10-CM | POA: Diagnosis not present

## 2024-06-04 DIAGNOSIS — G40909 Epilepsy, unspecified, not intractable, without status epilepticus: Secondary | ICD-10-CM | POA: Diagnosis not present

## 2024-06-10 ENCOUNTER — Encounter: Payer: MEDICARE | Admitting: Registered Nurse

## 2024-06-10 DIAGNOSIS — G479 Sleep disorder, unspecified: Secondary | ICD-10-CM | POA: Diagnosis not present

## 2024-06-10 DIAGNOSIS — R4789 Other speech disturbances: Secondary | ICD-10-CM | POA: Diagnosis not present

## 2024-06-10 DIAGNOSIS — R451 Restlessness and agitation: Secondary | ICD-10-CM | POA: Diagnosis not present

## 2024-06-12 ENCOUNTER — Other Ambulatory Visit: Payer: Self-pay | Admitting: Internal Medicine

## 2024-06-12 MED ORDER — OXYCODONE HCL 5 MG PO TABS: 5.0000 mg | ORAL_TABLET | Freq: Four times a day (QID) | ORAL | 0 refills | Status: AC | PRN

## 2024-06-15 DIAGNOSIS — G4700 Insomnia, unspecified: Secondary | ICD-10-CM | POA: Insufficient documentation

## 2024-06-15 DIAGNOSIS — F411 Generalized anxiety disorder: Secondary | ICD-10-CM | POA: Diagnosis not present

## 2024-06-15 DIAGNOSIS — I69359 Hemiplegia and hemiparesis following cerebral infarction affecting unspecified side: Secondary | ICD-10-CM | POA: Insufficient documentation

## 2024-06-15 DIAGNOSIS — R41841 Cognitive communication deficit: Secondary | ICD-10-CM | POA: Diagnosis not present

## 2024-06-15 DIAGNOSIS — G40909 Epilepsy, unspecified, not intractable, without status epilepticus: Secondary | ICD-10-CM | POA: Diagnosis not present

## 2024-06-15 NOTE — Progress Notes (Signed)
 Remote Loop Recorder Transmission

## 2024-06-19 ENCOUNTER — Encounter (HOSPITAL_COMMUNITY): Payer: MEDICARE

## 2024-06-22 ENCOUNTER — Encounter (HOSPITAL_COMMUNITY): Payer: MEDICARE

## 2024-06-22 NOTE — Progress Notes (Incomplete)
 HEART & VASCULAR TRANSITION OF CARE CONSULT NOTE   Referring Physician: Fairy Frames, MD PCP: Clarice Nottingham, MD  Cardiologist: None  HPI: Referred to clinic by Dr. Fairy, Internal Medicine for heart failure consultation.   Beth Macdonald is a 78 y.o. female with history of T2DM, neuropathy, DVT, fibromuscular hyperplasia, CKD, seizures, and recent CVA with hemorrhagic conversion. Now with new diagnosis HFrEF.   She was recently admitted 05/11/24 with CVA after presenting with focal seizure felt to be status epilepticus secondary to UTI vs autoimmune encephalitis. She was treated with high-dose steroids, abx, and Keppra . CTA head and neck showed R MCA M2 branch occlusion with hemorrhagic progression. She initially required intubation for airway protection.  Of note, did have elevated troponins (8182>8609) with STE on 05/14/24, ischemic evaluation was forgone due to renal function as well as patient's neurological condition. Echo showed new diagnosis HFrEF (see cardiac testing). 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 CMR deferred at the times as well due to mentation. She was discharged to CIR. At time of CIR discharge she required max assistance for mobility and total assist with cognitive tasks. She was discharged from CIR to SNF.   Today she presents for transition of care visit. Overall feeling ***. NYHA ***. Reports {Symptoms; cardiac:12860::dyspnea,fatigue}. Denies {Symptoms; cardiac:12860::chest pain,dyspnea,fatigue,near-syncope,orthopnea,palpitations,dizziness,abnormal bleeding}. Able to perform ADLs. Appetite okay. Weight at home ***. BP at home***. Compliant with all medications. Denies ETOH, tobacco, or drug use.   Cardiac Testing:  - Echo 9/25: EF 25-30%, RWMA, nl RV, and mod MR  Past Medical History:  Diagnosis Date   Aortic atherosclerosis     Diabetes mellitus without complication (HCC)    Type 2   Diabetic neuropathy (HCC)    DVT of proximal lower limb (HCC)    Facial paralysis/Bells palsy    Fibromuscular hyperplasia of artery    Hyperlipidemia    Mitral valve insufficiency    Osteoarthritis    SCCA (squamous cell carcinoma) of skin 03/22/2021   Bridge of nose (in situ)   Skull fracture (HCC)    age 38   Squamous cell carcinoma of skin 03/22/2021   Right forehead (in situ)   TIA (transient ischemic attack) 2020   Varicose vein of leg     Current Outpatient Medications  Medication Sig Dispense Refill   acetaminophen  (TYLENOL ) 500 MG tablet Take 2 tablets (1,000 mg total) by mouth 3 (three) times daily.     atorvastatin  (LIPITOR) 20 MG tablet Take 1 tablet (20 mg total) by mouth daily. Hold until you are taking Paxlovid      clopidogrel  (PLAVIX ) 75 MG tablet Take 1 tablet (75 mg total) by mouth daily. 30 tablet 1   Clotrimazole (ATHLETES FOOT EX) Apply 1 Application topically 2 (two) times daily as needed (Rash).     donepezil  (ARICEPT ) 10 MG tablet Take 10 mg by mouth at bedtime.     famotidine  (PEPCID ) 20 MG tablet Take 1 tablet (20 mg total) by mouth 2 (two) times daily.     furosemide  (LASIX ) 40 MG tablet Take 1 tablet (40 mg total) by mouth daily.     hydrocortisone  (ANUSOL -HC) 25 MG suppository Place 1 suppository (25 mg total) rectally 2 (two) times daily as needed for hemorrhoids or anal itching.     hydrocortisone  2.5 % cream Apply 1 Application topically 2 (two) times daily as needed (Rash).  insulin  aspart (NOVOLOG ) 100 UNIT/ML injection Inject 0-5 Units into the skin 3 (three) times daily with meals. For BS 150-200--administer 1 unit SQ For BS 201-250--administer 2 units SQ For BS 251- 300 --administer 3 units SQ For BS 301-350 --administer 4 units SQ For BS 351-400-- administer 5 units SQ. For BS > 400 call MD.     insulin  glargine (LANTUS ) 100 UNIT/ML injection Inject 0.08 mLs (8 Units total) into the  skin 2 (two) times daily.     levETIRAcetam  (KEPPRA ) 500 MG tablet Take 1 tablet (500 mg total) by mouth 2 (two) times daily.     megestrol  (MEGACE ) 400 MG/10ML suspension Take 5 mLs (200 mg total) by mouth 2 (two) times daily.     melatonin 5 MG TABS Take 1 tablet (5 mg total) by mouth at bedtime as needed.     Multiple Vitamin (MULTIVITAMIN WITH MINERALS) TABS tablet Take 1 tablet by mouth daily.     Nutritional Supplements (FEEDING SUPPLEMENT, KATE FARMS STANDARD ENT 1.4,) LIQD liquid Take 325 mLs by mouth 2 (two) times daily between meals.     nystatin  (MYCOSTATIN /NYSTOP ) powder Apply topically 2 (two) times daily.     oxyCODONE  (OXY IR/ROXICODONE ) 5 MG immediate release tablet Take 1 tablet (5 mg total) by mouth every 6 (six) hours as needed for severe pain (pain score 7-10). 60 tablet 0   Polyethyl Glycol-Propyl Glycol (SYSTANE FREE OP) Apply 1 drop to eye daily as needed (dryness/irritation).     polyethylene glycol (MIRALAX  / GLYCOLAX ) 17 g packet Take 34 g by mouth daily.     potassium chloride  SA (KLOR-CON  M) 20 MEQ tablet Take 1 tablet (20 mEq total) by mouth daily.     QUEtiapine  (SEROQUEL ) 25 MG tablet Take 0.5 tablets (12.5 mg total) by mouth at bedtime.     REPATHA  SURECLICK 140 MG/ML SOAJ Inject 140 mg into the skin every 14 (fourteen) days. Sunday     Semaglutide ,0.25 or 0.5MG /DOS, 2 MG/3ML SOPN Inject 0.5 mg into the skin once a week. (Patient taking differently: Inject 1 mg into the skin once a week.)     spironolactone  (ALDACTONE ) 25 MG tablet Take 0.5 tablets (12.5 mg total) by mouth daily.     tamsulosin  (FLOMAX ) 0.4 MG CAPS capsule Take 1 capsule (0.4 mg total) by mouth daily after supper.     Zinc  Oxide (TRIPLE PASTE) 12.8 % ointment Apply topically as needed for irritation.     No current facility-administered medications for this visit.    Allergies  Allergen Reactions   Lactose Intolerance (Gi) Diarrhea   Clindamycin/Lincomycin Other (See Comments)    Cannot  tolerate mycins causes C-Diff   Erythromycin     Other Reaction(s): Not available  erythromycin   Milk-Related Compounds Diarrhea   Tetracyclines & Related Other (See Comments)    Feel really nervous      Social History   Socioeconomic History   Marital status: Married    Spouse name: Not on file   Number of children: 0   Years of education: Not on file   Highest education level: Not on file  Occupational History   Not on file  Tobacco Use   Smoking status: Former    Current packs/day: 0.00    Average packs/day: 1 pack/day for 20.0 years (20.0 ttl pk-yrs)    Types: Cigarettes    Start date: 60    Quit date: 19    Years since quitting: 45.8   Smokeless tobacco: Never  Vaping  Use   Vaping status: Never Used  Substance and Sexual Activity   Alcohol use: Yes    Comment: social/rarely   Drug use: Never   Sexual activity: Yes  Other Topics Concern   Not on file  Social History Narrative   Not on file   Social Drivers of Health   Financial Resource Strain: Low Risk  (05/20/2024)   Overall Financial Resource Strain (CARDIA)    Difficulty of Paying Living Expenses: Not very hard  Food Insecurity: No Food Insecurity (05/14/2024)   Hunger Vital Sign    Worried About Running Out of Food in the Last Year: Never true    Ran Out of Food in the Last Year: Never true  Transportation Needs: No Transportation Needs (05/14/2024)   PRAPARE - Administrator, Civil Service (Medical): No    Lack of Transportation (Non-Medical): No  Physical Activity: Not on file  Stress: Not on file  Social Connections: Unknown (05/14/2024)   Social Connection and Isolation Panel    Frequency of Communication with Friends and Family: More than three times a week    Frequency of Social Gatherings with Friends and Family: Twice a week    Attends Religious Services: Not on Marketing executive or Organizations: No    Attends Banker Meetings: 1 to 4 times per  year    Marital Status: Married  Recent Concern: Social Connections - Moderately Isolated (03/14/2024)   Social Connection and Isolation Panel    Frequency of Communication with Friends and Family: More than three times a week    Frequency of Social Gatherings with Friends and Family: More than three times a week    Attends Religious Services: Never    Database administrator or Organizations: No    Attends Banker Meetings: Never    Marital Status: Married  Catering manager Violence: Not At Risk (05/14/2024)   Humiliation, Afraid, Rape, and Kick questionnaire    Fear of Current or Ex-Partner: No    Emotionally Abused: No    Physically Abused: No    Sexually Abused: No      Family History  Problem Relation Age of Onset   Stroke Mother    Hypertension Father    Dementia Father    Leukemia Brother    Stroke Brother    Colon cancer Neg Hx    Esophageal cancer Neg Hx    Pancreatic cancer Neg Hx    Stomach cancer Neg Hx     There were no vitals filed for this visit.  PHYSICAL EXAM: General: Well appearing. No distress on RA Cardiac: JVP ***. S1 and S2 present. No murmurs or rub. Resp: Lung sounds clear and equal B/L Abdomen: Soft, non-tender, non-distended.  Extremities: Warm and dry.  *** edema.  Neuro: Alert and oriented x3. Affect pleasant. Moves all extremities without difficulty.  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 Unable to r/o ICM (RWMA on echo, STE, and trops [id number]) due to mental cognition and renal function - NYHA *** Volume *** - continue lasix  40 mg daily - GDMT: ? blocker:  ARB/ARNI: MRA: continue spiro 12.5 mg daily SGLT2i: - Recommend palliative care    Referred to HFSW (PCP, Medications, Transportation, ETOH Abuse, Drug Abuse, Insurance, Financial ): Yes or No Refer to Pharmacy: Yes or No Refer to  Home Health: Yes on  No Refer to Advanced Heart Failure Clinic: Yes or no  Refer to General Cardiology: Yes or No  Follow up with ***  [place] Ema Hebner, NP 06/22/24

## 2024-07-01 DIAGNOSIS — R42 Dizziness and giddiness: Secondary | ICD-10-CM | POA: Diagnosis not present

## 2024-07-01 DIAGNOSIS — F411 Generalized anxiety disorder: Secondary | ICD-10-CM | POA: Diagnosis not present

## 2024-07-08 DIAGNOSIS — I6930 Unspecified sequelae of cerebral infarction: Secondary | ICD-10-CM | POA: Diagnosis not present

## 2024-07-08 DIAGNOSIS — R1312 Dysphagia, oropharyngeal phase: Secondary | ICD-10-CM | POA: Diagnosis not present

## 2024-07-08 DIAGNOSIS — R531 Weakness: Secondary | ICD-10-CM | POA: Diagnosis not present

## 2024-07-08 DIAGNOSIS — G510 Bell's palsy: Secondary | ICD-10-CM | POA: Diagnosis not present

## 2024-07-08 DIAGNOSIS — R42 Dizziness and giddiness: Secondary | ICD-10-CM | POA: Diagnosis not present

## 2024-07-12 ENCOUNTER — Ambulatory Visit: Payer: MEDICARE

## 2024-07-12 DIAGNOSIS — I639 Cerebral infarction, unspecified: Secondary | ICD-10-CM | POA: Diagnosis not present

## 2024-07-12 LAB — CUP PACEART REMOTE DEVICE CHECK
Date Time Interrogation Session: 20251108231533
Implantable Pulse Generator Implant Date: 20240212

## 2024-07-13 DIAGNOSIS — G40909 Epilepsy, unspecified, not intractable, without status epilepticus: Secondary | ICD-10-CM | POA: Diagnosis not present

## 2024-07-13 DIAGNOSIS — R41841 Cognitive communication deficit: Secondary | ICD-10-CM | POA: Diagnosis not present

## 2024-07-13 DIAGNOSIS — F411 Generalized anxiety disorder: Secondary | ICD-10-CM | POA: Diagnosis not present

## 2024-07-13 DIAGNOSIS — G4700 Insomnia, unspecified: Secondary | ICD-10-CM | POA: Diagnosis not present

## 2024-07-13 DIAGNOSIS — I69359 Hemiplegia and hemiparesis following cerebral infarction affecting unspecified side: Secondary | ICD-10-CM | POA: Diagnosis not present

## 2024-07-13 NOTE — Progress Notes (Incomplete)
 HEART & VASCULAR TRANSITION OF CARE CONSULT NOTE   Referring Physician: Fairy Frames, MD PCP: Clarice Nottingham, MD  Cardiologist: None  HPI: Referred to clinic by Dr. Fairy, Internal Medicine for heart failure consultation.   Beth Macdonald is a 78 y.o. female with history of T2DM, neuropathy, DVT, fibromuscular hyperplasia, CKD, seizures, and recent CVA with hemorrhagic conversion. Now with new diagnosis HFrEF.   She was recently admitted 05/11/24 with CVA after presenting with focal seizure felt to be status epilepticus secondary to UTI vs autoimmune encephalitis. She was treated with high-dose steroids, abx, and Keppra . CTA head and neck showed R MCA M2 branch occlusion with hemorrhagic progression. She initially required intubation for airway protection.  Of note, did have elevated troponins (8182>8609) with STE on 05/14/24, ischemic evaluation was forgone due to renal function as well as patient's neurological condition. Echo showed new diagnosis HFrEF (see cardiac testing). 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 CMR deferred at the times as well due to mentation. She was discharged to CIR. At time of CIR discharge she required max assistance for mobility and total assist with cognitive tasks. She was discharged from CIR to SNF.   Today she presents for transition of care visit. Overall feeling ***. NYHA ***. Reports {Symptoms; cardiac:12860::dyspnea,fatigue}. Denies {Symptoms; cardiac:12860::chest pain,dyspnea,fatigue,near-syncope,orthopnea,palpitations,dizziness,abnormal bleeding}. Able to perform ADLs. Appetite okay. Weight at home ***. BP at home***. Compliant with all medications. Denies ETOH, tobacco, or drug use.   Cardiac Testing:  - Echo 9/25: EF 25-30%, RWMA, nl RV, and mod MR  Past Medical History:  Diagnosis Date   Aortic atherosclerosis     Diabetes mellitus without complication (HCC)    Type 2   Diabetic neuropathy (HCC)    DVT of proximal lower limb (HCC)    Facial paralysis/Bells palsy    Fibromuscular hyperplasia of artery    Hyperlipidemia    Mitral valve insufficiency    Osteoarthritis    SCCA (squamous cell carcinoma) of skin 03/22/2021   Bridge of nose (in situ)   Skull fracture (HCC)    age 75   Squamous cell carcinoma of skin 03/22/2021   Right forehead (in situ)   TIA (transient ischemic attack) 2020   Varicose vein of leg     Current Outpatient Medications  Medication Sig Dispense Refill   acetaminophen  (TYLENOL ) 500 MG tablet Take 2 tablets (1,000 mg total) by mouth 3 (three) times daily.     atorvastatin  (LIPITOR) 20 MG tablet Take 1 tablet (20 mg total) by mouth daily. Hold until you are taking Paxlovid      clopidogrel  (PLAVIX ) 75 MG tablet Take 1 tablet (75 mg total) by mouth daily. 30 tablet 1   Clotrimazole (ATHLETES FOOT EX) Apply 1 Application topically 2 (two) times daily as needed (Rash).     donepezil  (ARICEPT ) 10 MG tablet Take 10 mg by mouth at bedtime.     famotidine  (PEPCID ) 20 MG tablet Take 1 tablet (20 mg total) by mouth 2 (two) times daily.     furosemide  (LASIX ) 40 MG tablet Take 1 tablet (40 mg total) by mouth daily.     hydrocortisone  (ANUSOL -HC) 25 MG suppository Place 1 suppository (25 mg total) rectally 2 (two) times daily as needed for hemorrhoids or anal itching.     hydrocortisone  2.5 % cream Apply 1 Application topically 2 (two) times daily as needed (Rash).  insulin  aspart (NOVOLOG ) 100 UNIT/ML injection Inject 0-5 Units into the skin 3 (three) times daily with meals. For BS 150-200--administer 1 unit SQ For BS 201-250--administer 2 units SQ For BS 251- 300 --administer 3 units SQ For BS 301-350 --administer 4 units SQ For BS 351-400-- administer 5 units SQ. For BS > 400 call MD.     insulin  glargine (LANTUS ) 100 UNIT/ML injection Inject 0.08 mLs (8 Units total) into the  skin 2 (two) times daily.     levETIRAcetam  (KEPPRA ) 500 MG tablet Take 1 tablet (500 mg total) by mouth 2 (two) times daily.     megestrol  (MEGACE ) 400 MG/10ML suspension Take 5 mLs (200 mg total) by mouth 2 (two) times daily.     melatonin 5 MG TABS Take 1 tablet (5 mg total) by mouth at bedtime as needed.     Multiple Vitamin (MULTIVITAMIN WITH MINERALS) TABS tablet Take 1 tablet by mouth daily.     Nutritional Supplements (FEEDING SUPPLEMENT, KATE FARMS STANDARD ENT 1.4,) LIQD liquid Take 325 mLs by mouth 2 (two) times daily between meals.     nystatin  (MYCOSTATIN /NYSTOP ) powder Apply topically 2 (two) times daily.     oxyCODONE  (OXY IR/ROXICODONE ) 5 MG immediate release tablet Take 1 tablet (5 mg total) by mouth every 6 (six) hours as needed for severe pain (pain score 7-10). 60 tablet 0   Polyethyl Glycol-Propyl Glycol (SYSTANE FREE OP) Apply 1 drop to eye daily as needed (dryness/irritation).     polyethylene glycol (MIRALAX  / GLYCOLAX ) 17 g packet Take 34 g by mouth daily.     potassium chloride  SA (KLOR-CON  M) 20 MEQ tablet Take 1 tablet (20 mEq total) by mouth daily.     QUEtiapine  (SEROQUEL ) 25 MG tablet Take 0.5 tablets (12.5 mg total) by mouth at bedtime.     REPATHA  SURECLICK 140 MG/ML SOAJ Inject 140 mg into the skin every 14 (fourteen) days. Sunday     Semaglutide ,0.25 or 0.5MG /DOS, 2 MG/3ML SOPN Inject 0.5 mg into the skin once a week. (Patient taking differently: Inject 1 mg into the skin once a week.)     spironolactone  (ALDACTONE ) 25 MG tablet Take 0.5 tablets (12.5 mg total) by mouth daily.     tamsulosin  (FLOMAX ) 0.4 MG CAPS capsule Take 1 capsule (0.4 mg total) by mouth daily after supper.     Zinc  Oxide (TRIPLE PASTE) 12.8 % ointment Apply topically as needed for irritation.     No current facility-administered medications for this visit.    Allergies  Allergen Reactions   Lactose Intolerance (Gi) Diarrhea   Clindamycin/Lincomycin Other (See Comments)    Cannot  tolerate mycins causes C-Diff   Erythromycin     Other Reaction(s): Not available  erythromycin   Milk-Related Compounds Diarrhea   Tetracyclines & Related Other (See Comments)    Feel really nervous      Social History   Socioeconomic History   Marital status: Married    Spouse name: Not on file   Number of children: 0   Years of education: Not on file   Highest education level: Not on file  Occupational History   Not on file  Tobacco Use   Smoking status: Former    Current packs/day: 0.00    Average packs/day: 1 pack/day for 20.0 years (20.0 ttl pk-yrs)    Types: Cigarettes    Start date: 45    Quit date: 33    Years since quitting: 45.8   Smokeless tobacco: Never  Vaping  Use   Vaping status: Never Used  Substance and Sexual Activity   Alcohol use: Yes    Comment: social/rarely   Drug use: Never   Sexual activity: Yes  Other Topics Concern   Not on file  Social History Narrative   Not on file   Social Drivers of Health   Financial Resource Strain: Low Risk  (05/20/2024)   Overall Financial Resource Strain (CARDIA)    Difficulty of Paying Living Expenses: Not very hard  Food Insecurity: No Food Insecurity (05/14/2024)   Hunger Vital Sign    Worried About Running Out of Food in the Last Year: Never true    Ran Out of Food in the Last Year: Never true  Transportation Needs: No Transportation Needs (05/14/2024)   PRAPARE - Administrator, Civil Service (Medical): No    Lack of Transportation (Non-Medical): No  Physical Activity: Not on file  Stress: Not on file  Social Connections: Unknown (05/14/2024)   Social Connection and Isolation Panel    Frequency of Communication with Friends and Family: More than three times a week    Frequency of Social Gatherings with Friends and Family: Twice a week    Attends Religious Services: Not on Marketing Executive or Organizations: No    Attends Banker Meetings: 1 to 4 times per  year    Marital Status: Married  Recent Concern: Social Connections - Moderately Isolated (03/14/2024)   Social Connection and Isolation Panel    Frequency of Communication with Friends and Family: More than three times a week    Frequency of Social Gatherings with Friends and Family: More than three times a week    Attends Religious Services: Never    Database Administrator or Organizations: No    Attends Banker Meetings: Never    Marital Status: Married  Catering Manager Violence: Not At Risk (05/14/2024)   Humiliation, Afraid, Rape, and Kick questionnaire    Fear of Current or Ex-Partner: No    Emotionally Abused: No    Physically Abused: No    Sexually Abused: No      Family History  Problem Relation Age of Onset   Stroke Mother    Hypertension Father    Dementia Father    Leukemia Brother    Stroke Brother    Colon cancer Neg Hx    Esophageal cancer Neg Hx    Pancreatic cancer Neg Hx    Stomach cancer Neg Hx     There were no vitals filed for this visit.  PHYSICAL EXAM: General: Well appearing. No distress on RA Cardiac: JVP ***. S1 and S2 present. No murmurs or rub. Resp: Lung sounds clear and equal B/L Abdomen: Soft, non-tender, non-distended.  Extremities: Warm and dry.  *** edema.  Neuro: Alert and oriented x3. Affect pleasant. Moves all extremities without difficulty.  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 Unable to r/o ICM (RWMA on echo, STE, and trops [id number]) due to mental cognition and renal function - NYHA *** Volume *** - continue lasix  40 mg daily - GDMT: ? blocker:  ARB/ARNI: MRA: continue spiro 12.5 mg daily SGLT2i: - Recommend palliative care    Referred to HFSW (PCP, Medications, Transportation, ETOH Abuse, Drug Abuse, Insurance, Financial ): Yes or No Refer to Pharmacy: Yes or No Refer to  Home Health: Yes on  No Refer to Advanced Heart Failure Clinic: Yes or no  Refer to General Cardiology: Yes or No  Follow up with ***  Trinetta Alemu, NP 07/13/24

## 2024-07-14 ENCOUNTER — Ambulatory Visit: Payer: Self-pay | Admitting: Cardiology

## 2024-07-15 ENCOUNTER — Ambulatory Visit (HOSPITAL_COMMUNITY): Payer: MEDICARE

## 2024-07-15 NOTE — Progress Notes (Signed)
 Remote Loop Recorder Transmission

## 2024-07-21 DIAGNOSIS — Z23 Encounter for immunization: Secondary | ICD-10-CM | POA: Diagnosis not present

## 2024-07-28 DIAGNOSIS — E118 Type 2 diabetes mellitus with unspecified complications: Secondary | ICD-10-CM | POA: Diagnosis not present

## 2024-07-28 DIAGNOSIS — F0150 Vascular dementia without behavioral disturbance: Secondary | ICD-10-CM | POA: Diagnosis not present

## 2024-07-28 DIAGNOSIS — G40909 Epilepsy, unspecified, not intractable, without status epilepticus: Secondary | ICD-10-CM | POA: Diagnosis not present

## 2024-08-06 ENCOUNTER — Telehealth: Payer: Self-pay | Admitting: Neurology

## 2024-08-06 NOTE — Telephone Encounter (Signed)
 Patient's wife, [name] Horse called to verify appointment date and time.

## 2024-08-07 ENCOUNTER — Ambulatory Visit: Payer: MEDICARE | Admitting: Adult Health

## 2024-08-11 DIAGNOSIS — F0150 Vascular dementia without behavioral disturbance: Secondary | ICD-10-CM | POA: Diagnosis not present

## 2024-08-11 DIAGNOSIS — E118 Type 2 diabetes mellitus with unspecified complications: Secondary | ICD-10-CM | POA: Diagnosis not present

## 2024-08-11 DIAGNOSIS — G40909 Epilepsy, unspecified, not intractable, without status epilepticus: Secondary | ICD-10-CM | POA: Diagnosis not present

## 2024-08-12 ENCOUNTER — Ambulatory Visit: Payer: MEDICARE

## 2024-08-13 ENCOUNTER — Ambulatory Visit: Payer: Self-pay | Admitting: Cardiology

## 2024-08-13 LAB — CUP PACEART REMOTE DEVICE CHECK
Date Time Interrogation Session: 20251209232519
Implantable Pulse Generator Implant Date: 20240212

## 2024-08-19 NOTE — Progress Notes (Signed)
 Remote Loop Recorder Transmission

## 2024-09-12 ENCOUNTER — Ambulatory Visit: Payer: MEDICARE

## 2024-09-12 DIAGNOSIS — I639 Cerebral infarction, unspecified: Secondary | ICD-10-CM | POA: Diagnosis not present

## 2024-09-14 LAB — CUP PACEART REMOTE DEVICE CHECK
Date Time Interrogation Session: 20260109231718
Implantable Pulse Generator Implant Date: 20240212

## 2024-09-15 ENCOUNTER — Ambulatory Visit: Payer: Self-pay | Admitting: Cardiology

## 2024-09-15 NOTE — Progress Notes (Signed)
 Remote Loop Recorder Transmission

## 2024-09-22 ENCOUNTER — Ambulatory Visit: Payer: MEDICARE | Admitting: Neurology

## 2024-09-22 ENCOUNTER — Encounter: Payer: Self-pay | Admitting: Neurology

## 2024-09-22 VITALS — BP 138/86 | HR 81

## 2024-09-22 DIAGNOSIS — R32 Unspecified urinary incontinence: Secondary | ICD-10-CM | POA: Insufficient documentation

## 2024-09-22 DIAGNOSIS — R2689 Other abnormalities of gait and mobility: Secondary | ICD-10-CM | POA: Insufficient documentation

## 2024-09-22 DIAGNOSIS — F0280 Dementia in other diseases classified elsewhere without behavioral disturbance: Secondary | ICD-10-CM | POA: Diagnosis not present

## 2024-09-22 DIAGNOSIS — F0150 Vascular dementia without behavioral disturbance: Secondary | ICD-10-CM | POA: Diagnosis not present

## 2024-09-22 DIAGNOSIS — N1831 Chronic kidney disease, stage 3a: Secondary | ICD-10-CM | POA: Insufficient documentation

## 2024-09-22 DIAGNOSIS — Z8673 Personal history of transient ischemic attack (TIA), and cerebral infarction without residual deficits: Secondary | ICD-10-CM

## 2024-09-22 DIAGNOSIS — E7800 Pure hypercholesterolemia, unspecified: Secondary | ICD-10-CM | POA: Insufficient documentation

## 2024-09-22 DIAGNOSIS — R413 Other amnesia: Secondary | ICD-10-CM | POA: Insufficient documentation

## 2024-09-22 DIAGNOSIS — N1832 Chronic kidney disease, stage 3b: Secondary | ICD-10-CM | POA: Insufficient documentation

## 2024-09-22 DIAGNOSIS — G40011 Localization-related (focal) (partial) idiopathic epilepsy and epileptic syndromes with seizures of localized onset, intractable, with status epilepticus: Secondary | ICD-10-CM | POA: Diagnosis not present

## 2024-09-22 DIAGNOSIS — R159 Full incontinence of feces: Secondary | ICD-10-CM | POA: Insufficient documentation

## 2024-09-22 DIAGNOSIS — G309 Alzheimer's disease, unspecified: Secondary | ICD-10-CM

## 2024-09-22 MED ORDER — MEMANTINE HCL 10 MG PO TABS: 10.0000 mg | ORAL_TABLET | Freq: Two times a day (BID) | ORAL | 3 refills | Status: AC

## 2024-09-22 NOTE — Progress Notes (Signed)
 " [facility] [address] [place]. [place] [address] [phone]       OFFICE FOLLOW-UP NOTE  [name] Date of Birth:  [birth year] Medical Record Number:  [id number]   HPI:  Update 09/22/2024 : She returns for follow-up after last visit with me 6 months ago.  She is accompanied by her wife and also spoke to her Beth Macdonald over the phone.  Patient was admitted on 04/11/2024 for worsening confusion and gait difficulty weakness.  MRI was suggestive of left caudate infarct with some petechial hemorrhage.  CT angiogram showed moderate to severe left M3 severe right M1 Dr. Dolphus surgical changes.  Patient subsequently was discharged but was readmitted on 05/11/2024 for encephalopathy due to UTI and focal status epilepticus.  She had prolonged EEG monitoring in ICU and was started on Keppra  500 mg twice daily.  She was delirious and confused.  She has been now living for the last 2 months in a skilled nursing facility.  She is total care.  Recommend family members that cannot follow conversations.  She has cognitively declined since last she has physically shown improvement still not able to walk.  She is getting physical therapy but requires two-person assist to even stand has not been walking a lot.  On cognitive testing today she scored 11/30 which is decline from 14/30 at last visit.  She continues to be on both aspirin  and Plavix  and is tolerating it well without bruising or bleeding.  Loop recorder interrogation has not yet shown paroxysmal A-fib.  She is on Aricept  10 mg daily for her dementia and is interested in trying Namenda  Office visit 03/24/2024 Beth Macdonald is a pleasant 79 year old Caucasian lady seen today for initial office follow-up visit following hospital consultation for stroke in July 2025.  She is accompanied by her wife Beth Macdonald. She has past medical history diabetes, diabetic neuropathy, DVT, Bell's palsy, fibromuscular hyperplasia of artery, aortic  atherosclerosis, hyperlipidemia, mitral valve insufficiency, osteoarthritis, dementia, skull fracture prior stroke 1 year ago with some residual left-sided weakness.  The patient was with her wife at farmers market where all of a sudden she was unable to move her legs.  She was able to stand by holding onto a railing.  She stated she could not control the left leg.  EMS was called.  On arrival history was moderate as patient kept on saying fluctuating weakness in both legs and history taking was compromised by baseline dementia.  MRI scan revealed an acute nonhemorrhagic infarct involving the left mid frontal as well as left with changes of chronic encephalomalacia in involving right occipital and right medial parietal lobes as well as advanced changes of small vessel disease.  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  LDL cholesterol 68 mg percent.  Hemoglobin A1c of 6.7.  Echocardiogram showed ejection fraction 60 to 65% left atrial size is normal.  Patient started on dual antiplatelet therapy aspirin  and Plavix  for 3 months followed by Plavix  alone.  She was also started on Aricept  5 mg daily for dementia which she seems to be tolerating well so far.  Patient is currently at home.  She is doing home physical occupational therapy.  She is able to ambulate well with a cane but she forgot a cane today and coming to the office.  Patient needs help  with activities of daily living like dressing herself as well as shower.  She has poor short-term memory and is often disoriented at baseline.  She has not exhibited any delusions hallucination or behavioral abnormalities.  She has not exhibited unsafe behavior.  She has had a few falls but none recently.  She is tolerating aspirin  Plavix  well with minor bruising and no bleeding.  Blood pressure is  well-controlled at home and today was 139/79.  She is tolerating Lipitor well without side effects.  There is no family history of dementia. ROS:   14 system review of systems is positive for memory loss, leg weakness, difficulty walking, forgetfulness, bruising all other systems negative  PMH:  Past Medical History:  Diagnosis Date   Aortic atherosclerosis    Diabetes mellitus without complication (HCC)    Type 2   Diabetic neuropathy (HCC)    DVT of proximal lower limb (HCC)    Facial paralysis/Bells palsy    Fibromuscular hyperplasia of artery    Hyperlipidemia    Mitral valve insufficiency    Osteoarthritis    SCCA (squamous cell carcinoma) of skin 03/22/2021   Bridge of nose (in situ)   Skull fracture (HCC)    age 40   Squamous cell carcinoma of skin 03/22/2021   Right forehead (in situ)   TIA (transient ischemic attack) 2020   Varicose vein of leg     Social History:  Social History   Socioeconomic History   Marital status: Married    Spouse name: Not on file   Number of children: 0   Years of education: Not on file   Highest education level: Not on file  Occupational History   Not on file  Tobacco Use   Smoking status: Former    Current packs/day: 0.00    Average packs/day: 1 pack/day for 20.0 years (20.0 ttl pk-yrs)    Types: Cigarettes    Start date: 98    Quit date: 3    Years since quitting: 46.0   Smokeless tobacco: Never  Vaping Use   Vaping status: Never Used  Substance and Sexual Activity   Alcohol use: Yes    Comment: social/rarely   Drug use: Never   Sexual activity: Yes  Other Topics Concern   Not on file  Social History Narrative   Not on file   Social Drivers of Health   Tobacco Use: Medium Risk (09/22/2024)   Patient History    Smoking Tobacco Use: Former    Smokeless Tobacco Use: Never    Passive Exposure: Not on Actuary Strain: Low Risk (05/20/2024)   Overall Financial Resource Strain (CARDIA)    Difficulty  of Paying Living Expenses: Not very hard  Food Insecurity: No Food Insecurity (05/14/2024)   Epic    Worried About Programme Researcher, Broadcasting/film/video in the Last Year: Never true    Ran Out of Food in the Last Year: Never true  Transportation Needs: No Transportation Needs (05/14/2024)   Epic    Lack of Transportation (Medical): No    Lack of Transportation (Non-Medical): No  Physical Activity: Not on file  Stress: Not on file  Social Connections: Unknown (05/14/2024)   Social Connection and Isolation Panel    Frequency of Communication with Friends and Family: More than three times a week    Frequency of Social Gatherings with Friends and Family: Twice a week    Attends Religious Services: Not on Insurance Claims Handler of [employer]  or Organizations: No    Attends Banker Meetings: 1 to 4 times per year    Marital Status: Married  Recent Concern: Social Connections - Moderately Isolated (03/14/2024)   Social Connection and Isolation Panel    Frequency of Communication with Friends and Family: More than three times a week    Frequency of Social Gatherings with Friends and Family: More than three times a week    Attends Religious Services: Never    Database Administrator or Organizations: No    Attends Banker Meetings: Never    Marital Status: Married  Catering Manager Violence: Not At Risk (05/14/2024)   Epic    Fear of Current or Ex-Partner: No    Emotionally Abused: No    Physically Abused: No    Sexually Abused: No  Depression (PHQ2-9): Low Risk (04/16/2024)   Depression (PHQ2-9)    PHQ-2 Score: 0  Alcohol Screen: Low Risk (05/20/2024)   Alcohol Screen    Last Alcohol Screening Score (AUDIT): 0  Housing: Low Risk (05/14/2024)   Epic    Unable to Pay for Housing in the Last Year: No    Number of Times Moved in the Last Year: 0    Homeless in the Last Year: No  Utilities: Not At Risk (05/14/2024)   Epic    Threatened with loss of utilities: No  Health Literacy: Not on  file    Medications:   Current Outpatient Medications on File Prior to Visit  Medication Sig Dispense Refill   acetaminophen  (TYLENOL ) 500 MG tablet Take 2 tablets (1,000 mg total) by mouth 3 (three) times daily.     atorvastatin  (LIPITOR) 20 MG tablet Take 1 tablet (20 mg total) by mouth daily. Hold until you are taking Paxlovid      busPIRone (BUSPAR) 10 MG tablet Take 10 mg by mouth 2 (two) times daily.     clopidogrel  (PLAVIX ) 75 MG tablet Take 75 mg by mouth daily.     donepezil  (ARICEPT ) 10 MG tablet Take 10 mg by mouth at bedtime.     famotidine  (PEPCID ) 20 MG tablet Take 1 tablet (20 mg total) by mouth 2 (two) times daily.     furosemide  (LASIX ) 40 MG tablet Take 1 tablet (40 mg total) by mouth daily.     hydrocortisone  2.5 % cream Apply 1 Application topically 2 (two) times daily as needed (Rash).     insulin  glargine (LANTUS ) 100 UNIT/ML injection Inject 0.08 mLs (8 Units total) into the skin 2 (two) times daily.     insulin  lispro (HUMALOG) 100 UNIT/ML KwikPen Inject into the skin.     levETIRAcetam  (KEPPRA ) 500 MG tablet Take 1 tablet (500 mg total) by mouth 2 (two) times daily.     megestrol  (MEGACE ) 400 MG/10ML suspension Take 5 mLs (200 mg total) by mouth 2 (two) times daily.     melatonin 5 MG TABS Take 1 tablet (5 mg total) by mouth at bedtime as needed.     metFORMIN (GLUCOPHAGE-XR) 500 MG 24 hr tablet Take 1,000 mg by mouth 2 (two) times daily.     Multiple Vitamin (MULTIVITAMIN WITH MINERALS) TABS tablet Take 1 tablet by mouth daily.     Nutritional Supplements (FEEDING SUPPLEMENT, KATE FARMS STANDARD ENT 1.4,) LIQD liquid Take 325 mLs by mouth 2 (two) times daily between meals.     nystatin  (MYCOSTATIN /NYSTOP ) powder Apply topically 2 (two) times daily.     Polyethyl Glycol-Propyl Glycol (SYSTANE FREE OP) Apply 1  drop to eye daily as needed (dryness/irritation).     potassium chloride  SA (KLOR-CON  M) 20 MEQ tablet Take 1 tablet (20 mEq total) by mouth daily.      QUEtiapine  (SEROQUEL ) 25 MG tablet Take 0.5 tablets (12.5 mg total) by mouth at bedtime.     REPATHA  SURECLICK 140 MG/ML SOAJ Inject 140 mg into the skin every 14 (fourteen) days. Sunday     Semaglutide ,0.25 or 0.5MG /DOS, 2 MG/3ML SOPN Inject 0.5 mg into the skin once a week. (Patient taking differently: Inject 1 mg into the skin once a week.)     sertraline (ZOLOFT) 25 MG tablet Take by mouth.     spironolactone  (ALDACTONE ) 25 MG tablet Take 0.5 tablets (12.5 mg total) by mouth daily.     tamsulosin  (FLOMAX ) 0.4 MG CAPS capsule Take 1 capsule (0.4 mg total) by mouth daily after supper.     Clotrimazole (ATHLETES FOOT EX) Apply 1 Application topically 2 (two) times daily as needed (Rash). (Patient not taking: Reported on 09/22/2024)     hydrocortisone  (ANUSOL -HC) 25 MG suppository Place 1 suppository (25 mg total) rectally 2 (two) times daily as needed for hemorrhoids or anal itching.     insulin  aspart (NOVOLOG ) 100 UNIT/ML injection Inject 0-5 Units into the skin 3 (three) times daily with meals. For BS 150-200--administer 1 unit SQ For BS 201-250--administer 2 units SQ For BS 251- 300 --administer 3 units SQ For BS 301-350 --administer 4 units SQ For BS 351-400-- administer 5 units SQ. For BS > 400 call MD.     oxyCODONE  (OXY IR/ROXICODONE ) 5 MG immediate release tablet Take 1 tablet (5 mg total) by mouth every 6 (six) hours as needed for severe pain (pain score 7-10). 60 tablet 0   polyethylene glycol (MIRALAX  / GLYCOLAX ) 17 g packet Take 34 g by mouth daily.     Zinc  Oxide (TRIPLE PASTE) 12.8 % ointment Apply topically as needed for irritation.     No current facility-administered medications on file prior to visit.    Allergies:   Allergies  Allergen Reactions   Lactose Intolerance (Gi) Diarrhea   Clindamycin/Lincomycin Other (See Comments)    Cannot tolerate mycins causes C-Diff   Erythromycin     Other Reaction(s): Not available  erythromycin   Milk-Related Compounds Diarrhea    Tetracyclines & Related Other (See Comments)    Feel really nervous    Physical Exam General: well developed, well nourished elderly Caucasian lady, seated, in no evident distress Head: head normocephalic and atraumatic.  Neck: supple with no carotid or supraclavicular bruits Cardiovascular: regular rate and rhythm, no murmurs Musculoskeletal: no deformity Skin:  no rash/petichiae Vascular:  Normal pulses all extremities Vitals:   09/22/24 1051  BP: 138/86  Pulse: 81   Neurologic Exam Mental Status: Awake and fully alert. disoriented to place and time. Recent and remote memory . Attention span, concentration and fund of knowledge . Mood and affect appropriate.  MMSE score 11/30 with deficits in orientation, attention calculation and recall.  Clock drawing 1/4.  Able to name only 4 animals which can walk on forelegs.  Geriatric depression scale not done recall 0/3.  Functional activity questionnaire not Cranial Nerves: Fundoscopic exam reveals sharp disc margins. Pupils equal, briskly reactive to light. Extraocular movements full without nystagmus. Visual fields full to confrontation. Hearing intact. Facial sensation intact.  Mild left lower facial weakness, tongue, palate moves normally and symmetrically.  Motor: Normal bulk and tone.  Mild left hemiparesis 4/5 strength with weakness of  left grip, intrinsic hand muscles as well as left hip flexors and ankle dorsiflexors..  Mild left lower extremity drift. Sensory.: intact to touch ,pinprick .position and vibratory sensation.  Coordination: Rapid alternating movements normal in all extremities. Finger-to-nose and heel-to-shin performed accurately bilaterally. Gait and Station: Arises from chair without difficulty. Stance is broad-based gait demonstrates mild left foot drop and dragging of the left leg. Reflexes: 1+ and symmetric. Toes downgoing.   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  Recent hospitalization September 2025 with refractory partial seizures status.  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       PLAN:I had a long discussion with the patient and her wife, plan for close the phone about her recent hospitalization for stroke and seizures and her progressive dementia and answered questions.  Continue Plavix  for stroke prevention and discontinue aspirin  now.  Maintain aggressive risk factor modification with strict control of hypertension with blood pressure goal below 130/90, lipids with LDL cholesterol goal below 70 mg percent and diabetes with hemoglobin A1c goal below 6.5%.  Ongoing physical and Occupational Therapy.  Continue Aricept   10 mg daily as well as trial of Namenda  5 mg at night for 1 week and then 5 mg twice daily for 1 month  and then 10 mg  twice daily as tolerated.  Return for follow-up in the future only as needed Greater than 50% of time during this 40 minute visit was spent on counseling,explanation of diagnosis of stroke and dementia and intracranial stenosis, planning of further management, discussion with patient and family and coordination of care Eather Popp, MD Note: This document was prepared with digital dictation and possible smart phrase technology. Any transcriptional errors that result from this process are unintentional  "

## 2024-09-22 NOTE — Patient Instructions (Signed)
 I had a long discussion with the patient and her wife, plan for close the phone about her recent hospitalization for stroke and seizures and her progressive dementia and answered questions.  Continue Plavix  for stroke prevention and discontinue aspirin  now.  Maintain aggressive risk factor modification with strict control of hypertension with blood pressure goal below 130/90, lipids with LDL cholesterol goal below 70 mg percent and diabetes with hemoglobin A1c goal below 6.5%.  Ongoing physical and Occupational Therapy.  Continue Aricept   10 mg daily as well as trial of Namenda  5 mg at night for 1 week and then 5 mg twice daily for 1 month  and then 10 mg  twice daily as tolerated.  Return for follow-up in the future only as needed
